# Patient Record
Sex: Male | Born: 1935
Health system: Southern US, Community
[De-identification: ages and names within clinical notes are randomized; demographics above are authoritative.]

## PROBLEM LIST (undated history)

## (undated) DIAGNOSIS — K5790 Diverticulosis of intestine, part unspecified, without perforation or abscess without bleeding: Secondary | ICD-10-CM

## (undated) DIAGNOSIS — N419 Inflammatory disease of prostate, unspecified: Secondary | ICD-10-CM

## (undated) DIAGNOSIS — K219 Gastro-esophageal reflux disease without esophagitis: Secondary | ICD-10-CM

## (undated) DIAGNOSIS — J8410 Pulmonary fibrosis, unspecified: Secondary | ICD-10-CM

## (undated) DIAGNOSIS — K649 Unspecified hemorrhoids: Secondary | ICD-10-CM

## (undated) DIAGNOSIS — I7090 Unspecified atherosclerosis: Secondary | ICD-10-CM

## (undated) DIAGNOSIS — J301 Allergic rhinitis due to pollen: Secondary | ICD-10-CM

## (undated) DIAGNOSIS — I10 Essential (primary) hypertension: Secondary | ICD-10-CM

## (undated) DIAGNOSIS — F1910 Other psychoactive substance abuse, uncomplicated: Secondary | ICD-10-CM

## (undated) DIAGNOSIS — Z7289 Other problems related to lifestyle: Secondary | ICD-10-CM

## (undated) DIAGNOSIS — K922 Gastrointestinal hemorrhage, unspecified: Secondary | ICD-10-CM

## (undated) DIAGNOSIS — M129 Arthropathy, unspecified: Secondary | ICD-10-CM

## (undated) DIAGNOSIS — G4733 Obstructive sleep apnea (adult) (pediatric): Secondary | ICD-10-CM

## (undated) DIAGNOSIS — Z950 Presence of cardiac pacemaker: Secondary | ICD-10-CM

## (undated) DIAGNOSIS — M1990 Unspecified osteoarthritis, unspecified site: Secondary | ICD-10-CM

## (undated) DIAGNOSIS — K639 Disease of intestine, unspecified: Secondary | ICD-10-CM

## (undated) DIAGNOSIS — G4730 Sleep apnea, unspecified: Secondary | ICD-10-CM

## (undated) DIAGNOSIS — Z5189 Encounter for other specified aftercare: Secondary | ICD-10-CM

## (undated) DIAGNOSIS — R933 Abnormal findings on diagnostic imaging of other parts of digestive tract: Secondary | ICD-10-CM

## (undated) DIAGNOSIS — N529 Male erectile dysfunction, unspecified: Secondary | ICD-10-CM

## (undated) DIAGNOSIS — I480 Paroxysmal atrial fibrillation: Secondary | ICD-10-CM

## (undated) DIAGNOSIS — F1090 Alcohol use, unspecified, uncomplicated: Secondary | ICD-10-CM

## (undated) DIAGNOSIS — E669 Obesity, unspecified: Secondary | ICD-10-CM

## (undated) DIAGNOSIS — H269 Unspecified cataract: Secondary | ICD-10-CM

## (undated) DIAGNOSIS — C4490 Unspecified malignant neoplasm of skin, unspecified: Secondary | ICD-10-CM

## (undated) DIAGNOSIS — K31819 Angiodysplasia of stomach and duodenum without bleeding: Secondary | ICD-10-CM

## (undated) DIAGNOSIS — K449 Diaphragmatic hernia without obstruction or gangrene: Secondary | ICD-10-CM

## (undated) DIAGNOSIS — I429 Cardiomyopathy, unspecified: Secondary | ICD-10-CM

## (undated) DIAGNOSIS — R001 Bradycardia, unspecified: Secondary | ICD-10-CM

## (undated) DIAGNOSIS — E785 Hyperlipidemia, unspecified: Secondary | ICD-10-CM

## (undated) DIAGNOSIS — I4892 Unspecified atrial flutter: Secondary | ICD-10-CM

## (undated) DIAGNOSIS — J439 Emphysema, unspecified: Secondary | ICD-10-CM

## (undated) DIAGNOSIS — K222 Esophageal obstruction: Secondary | ICD-10-CM

## (undated) DIAGNOSIS — Z9989 Dependence on other enabling machines and devices: Secondary | ICD-10-CM

## (undated) HISTORY — DX: Pulmonary fibrosis, unspecified: J84.10

## (undated) HISTORY — PX: INGUINAL HERNIA REPAIR: SHX194

## (undated) HISTORY — DX: Unspecified atherosclerosis: I70.90

## (undated) HISTORY — DX: Allergic rhinitis due to pollen: J30.1

## (undated) HISTORY — DX: Encounter for other specified aftercare: Z51.89

## (undated) HISTORY — DX: Gastrointestinal hemorrhage, unspecified: K92.2

## (undated) HISTORY — DX: Diverticulosis of intestine, part unspecified, without perforation or abscess without bleeding: K57.90

## (undated) HISTORY — DX: Alcohol use, unspecified, uncomplicated: F10.90

## (undated) HISTORY — DX: Other psychoactive substance abuse, uncomplicated: F19.10

## (undated) HISTORY — DX: Emphysema, unspecified: J43.9

## (undated) HISTORY — DX: Essential (primary) hypertension: I10

## (undated) HISTORY — DX: Unspecified malignant neoplasm of skin, unspecified: C44.90

## (undated) HISTORY — DX: Unspecified osteoarthritis, unspecified site: M19.90

## (undated) HISTORY — PX: ELBOW SURGERY: SHX618

## (undated) HISTORY — DX: Unspecified hemorrhoids: K64.9

## (undated) HISTORY — DX: Arthropathy, unspecified: M12.9

## (undated) HISTORY — DX: Unspecified cataract: H26.9

## (undated) HISTORY — PX: TONSILLECTOMY: SHX5217

## (undated) HISTORY — DX: Sleep apnea, unspecified: G47.30

## (undated) HISTORY — DX: Disease of intestine, unspecified: K63.9

## (undated) HISTORY — DX: Diaphragmatic hernia without obstruction or gangrene: K44.9

## (undated) HISTORY — DX: Inflammatory disease of prostate, unspecified: N41.9

## (undated) HISTORY — DX: Hyperlipidemia, unspecified: E78.5

## (undated) HISTORY — PX: UPPER GASTROINTESTINAL ENDOSCOPY: SHX188

## (undated) HISTORY — DX: Abnormal findings on diagnostic imaging of other parts of digestive tract: R93.3

## (undated) HISTORY — DX: Angiodysplasia of stomach and duodenum without bleeding: K31.819

## (undated) HISTORY — DX: Male erectile dysfunction, unspecified: N52.9

## (undated) HISTORY — DX: Obesity, unspecified: E66.9

## (undated) HISTORY — DX: Esophageal obstruction: K22.2

## (undated) HISTORY — PX: COLONOSCOPY: SHX174

## (undated) HISTORY — DX: Other problems related to lifestyle: Z72.89

---

## 1990-05-16 HISTORY — PX: OTHER SURGICAL HISTORY: SHX169

## 2004-04-29 ENCOUNTER — Ambulatory Visit: Payer: Self-pay | Admitting: Internal Medicine

## 2005-01-27 ENCOUNTER — Ambulatory Visit: Payer: Self-pay | Admitting: Internal Medicine

## 2005-02-01 ENCOUNTER — Ambulatory Visit: Payer: Self-pay | Admitting: Internal Medicine

## 2005-04-29 ENCOUNTER — Ambulatory Visit: Payer: Self-pay | Admitting: Pulmonary Disease

## 2005-06-15 ENCOUNTER — Ambulatory Visit: Payer: Self-pay | Admitting: Internal Medicine

## 2006-01-05 ENCOUNTER — Ambulatory Visit: Payer: Self-pay | Admitting: Internal Medicine

## 2006-03-17 ENCOUNTER — Ambulatory Visit: Payer: Self-pay | Admitting: Internal Medicine

## 2006-04-03 ENCOUNTER — Ambulatory Visit (HOSPITAL_COMMUNITY): Admission: RE | Admit: 2006-04-03 | Discharge: 2006-04-03 | Payer: Self-pay | Admitting: General Surgery

## 2006-04-07 ENCOUNTER — Ambulatory Visit: Payer: Self-pay | Admitting: Internal Medicine

## 2006-09-15 ENCOUNTER — Ambulatory Visit: Payer: Self-pay | Admitting: Internal Medicine

## 2006-09-15 LAB — CONVERTED CEMR LAB: PSA: 3.71 ng/mL (ref 0.10–4.00)

## 2007-01-24 ENCOUNTER — Encounter: Payer: Self-pay | Admitting: Internal Medicine

## 2007-01-24 DIAGNOSIS — I700 Atherosclerosis of aorta: Secondary | ICD-10-CM | POA: Insufficient documentation

## 2007-01-24 DIAGNOSIS — R972 Elevated prostate specific antigen [PSA]: Secondary | ICD-10-CM

## 2007-01-24 DIAGNOSIS — Z9089 Acquired absence of other organs: Secondary | ICD-10-CM | POA: Insufficient documentation

## 2007-01-24 DIAGNOSIS — Z8719 Personal history of other diseases of the digestive system: Secondary | ICD-10-CM

## 2007-01-24 DIAGNOSIS — D126 Benign neoplasm of colon, unspecified: Secondary | ICD-10-CM

## 2007-01-24 DIAGNOSIS — Z9889 Other specified postprocedural states: Secondary | ICD-10-CM | POA: Insufficient documentation

## 2007-01-24 DIAGNOSIS — Z85828 Personal history of other malignant neoplasm of skin: Secondary | ICD-10-CM

## 2007-01-24 DIAGNOSIS — J301 Allergic rhinitis due to pollen: Secondary | ICD-10-CM | POA: Insufficient documentation

## 2007-02-22 ENCOUNTER — Ambulatory Visit: Payer: Self-pay | Admitting: Internal Medicine

## 2007-03-20 ENCOUNTER — Ambulatory Visit: Payer: Self-pay | Admitting: Internal Medicine

## 2007-03-20 DIAGNOSIS — F528 Other sexual dysfunction not due to a substance or known physiological condition: Secondary | ICD-10-CM

## 2007-03-20 DIAGNOSIS — N529 Male erectile dysfunction, unspecified: Secondary | ICD-10-CM | POA: Insufficient documentation

## 2007-03-20 DIAGNOSIS — R351 Nocturia: Secondary | ICD-10-CM

## 2007-03-20 LAB — CONVERTED CEMR LAB: Testosterone: 276.55 ng/dL — ABNORMAL LOW (ref 350.00–890)

## 2007-03-30 ENCOUNTER — Telehealth: Payer: Self-pay | Admitting: Internal Medicine

## 2007-04-30 ENCOUNTER — Ambulatory Visit: Payer: Self-pay | Admitting: Internal Medicine

## 2007-04-30 DIAGNOSIS — J069 Acute upper respiratory infection, unspecified: Secondary | ICD-10-CM | POA: Insufficient documentation

## 2007-06-04 ENCOUNTER — Ambulatory Visit: Payer: Self-pay | Admitting: Internal Medicine

## 2007-06-20 ENCOUNTER — Telehealth (INDEPENDENT_AMBULATORY_CARE_PROVIDER_SITE_OTHER): Payer: Self-pay | Admitting: *Deleted

## 2007-07-02 ENCOUNTER — Ambulatory Visit: Payer: Self-pay | Admitting: Internal Medicine

## 2007-07-30 ENCOUNTER — Ambulatory Visit: Payer: Self-pay | Admitting: Internal Medicine

## 2007-08-29 ENCOUNTER — Ambulatory Visit: Payer: Self-pay | Admitting: Internal Medicine

## 2007-09-11 ENCOUNTER — Ambulatory Visit: Payer: Self-pay | Admitting: Internal Medicine

## 2007-09-11 DIAGNOSIS — M549 Dorsalgia, unspecified: Secondary | ICD-10-CM | POA: Insufficient documentation

## 2007-10-04 ENCOUNTER — Ambulatory Visit: Payer: Self-pay | Admitting: Internal Medicine

## 2007-10-12 ENCOUNTER — Ambulatory Visit: Payer: Self-pay | Admitting: Internal Medicine

## 2007-11-05 ENCOUNTER — Ambulatory Visit: Payer: Self-pay | Admitting: Internal Medicine

## 2007-11-15 ENCOUNTER — Ambulatory Visit: Payer: Self-pay | Admitting: Internal Medicine

## 2007-12-03 ENCOUNTER — Ambulatory Visit: Payer: Self-pay | Admitting: Internal Medicine

## 2008-01-07 ENCOUNTER — Telehealth: Payer: Self-pay | Admitting: Internal Medicine

## 2008-01-07 ENCOUNTER — Ambulatory Visit: Payer: Self-pay | Admitting: Internal Medicine

## 2008-02-04 ENCOUNTER — Ambulatory Visit: Payer: Self-pay | Admitting: Internal Medicine

## 2008-02-07 ENCOUNTER — Ambulatory Visit: Payer: Self-pay | Admitting: Internal Medicine

## 2008-03-10 ENCOUNTER — Encounter: Payer: Self-pay | Admitting: Internal Medicine

## 2008-03-10 ENCOUNTER — Ambulatory Visit: Payer: Self-pay | Admitting: Internal Medicine

## 2008-03-10 DIAGNOSIS — I10 Essential (primary) hypertension: Secondary | ICD-10-CM | POA: Insufficient documentation

## 2008-04-11 ENCOUNTER — Ambulatory Visit: Payer: Self-pay | Admitting: Internal Medicine

## 2008-04-11 LAB — CONVERTED CEMR LAB
Basophils Relative: 0.8 % (ref 0.0–3.0)
Calcium: 8.6 mg/dL (ref 8.4–10.5)
Cholesterol: 159 mg/dL (ref 0–200)
Creatinine, Ser: 0.9 mg/dL (ref 0.4–1.5)
GFR calc Af Amer: 106 mL/min
GFR calc non Af Amer: 88 mL/min
HCT: 47.3 % (ref 39.0–52.0)
Lymphocytes Relative: 33.6 % (ref 12.0–46.0)
MCHC: 35.1 g/dL (ref 30.0–36.0)
MCV: 99.2 fL (ref 78.0–100.0)
Monocytes Relative: 14.4 % — ABNORMAL HIGH (ref 3.0–12.0)
Neutro Abs: 3.1 10*3/uL (ref 1.4–7.7)
Neutrophils Relative %: 49.3 % (ref 43.0–77.0)
PSA: 5.23 ng/mL — ABNORMAL HIGH (ref 0.10–4.00)
Platelets: 152 10*3/uL (ref 150–400)
RBC: 4.77 M/uL (ref 4.22–5.81)
RDW: 12.5 % (ref 11.5–14.6)
Sodium: 140 meq/L (ref 135–145)

## 2008-04-13 ENCOUNTER — Telehealth: Payer: Self-pay | Admitting: Internal Medicine

## 2008-04-17 ENCOUNTER — Ambulatory Visit: Payer: Self-pay

## 2008-04-26 ENCOUNTER — Encounter: Payer: Self-pay | Admitting: Internal Medicine

## 2008-04-28 ENCOUNTER — Ambulatory Visit: Payer: Self-pay | Admitting: Internal Medicine

## 2008-07-14 ENCOUNTER — Encounter: Payer: Self-pay | Admitting: Internal Medicine

## 2009-01-08 ENCOUNTER — Encounter: Payer: Self-pay | Admitting: Internal Medicine

## 2009-01-13 ENCOUNTER — Ambulatory Visit: Payer: Self-pay | Admitting: Internal Medicine

## 2009-01-26 ENCOUNTER — Telehealth: Payer: Self-pay | Admitting: Internal Medicine

## 2009-01-27 ENCOUNTER — Ambulatory Visit: Payer: Self-pay | Admitting: Internal Medicine

## 2009-02-02 ENCOUNTER — Telehealth: Payer: Self-pay | Admitting: Internal Medicine

## 2009-02-25 ENCOUNTER — Telehealth (INDEPENDENT_AMBULATORY_CARE_PROVIDER_SITE_OTHER): Payer: Self-pay | Admitting: *Deleted

## 2009-03-12 ENCOUNTER — Encounter: Payer: Self-pay | Admitting: Internal Medicine

## 2009-04-13 ENCOUNTER — Ambulatory Visit: Payer: Self-pay | Admitting: Internal Medicine

## 2009-05-01 ENCOUNTER — Encounter (INDEPENDENT_AMBULATORY_CARE_PROVIDER_SITE_OTHER): Payer: Self-pay | Admitting: *Deleted

## 2009-05-06 ENCOUNTER — Ambulatory Visit: Payer: Self-pay | Admitting: Gastroenterology

## 2009-05-20 ENCOUNTER — Ambulatory Visit: Payer: Self-pay | Admitting: Gastroenterology

## 2009-05-21 ENCOUNTER — Ambulatory Visit: Payer: Self-pay | Admitting: Internal Medicine

## 2009-05-21 ENCOUNTER — Telehealth (INDEPENDENT_AMBULATORY_CARE_PROVIDER_SITE_OTHER): Payer: Self-pay | Admitting: *Deleted

## 2009-05-21 DIAGNOSIS — R001 Bradycardia, unspecified: Secondary | ICD-10-CM

## 2009-06-04 ENCOUNTER — Encounter: Payer: Self-pay | Admitting: Internal Medicine

## 2009-06-10 ENCOUNTER — Telehealth: Payer: Self-pay | Admitting: Internal Medicine

## 2009-06-10 ENCOUNTER — Encounter: Payer: Self-pay | Admitting: Internal Medicine

## 2009-06-11 ENCOUNTER — Ambulatory Visit: Payer: Self-pay

## 2010-03-16 ENCOUNTER — Encounter: Payer: Self-pay | Admitting: Internal Medicine

## 2010-04-14 ENCOUNTER — Encounter: Payer: Self-pay | Admitting: Internal Medicine

## 2010-04-14 ENCOUNTER — Ambulatory Visit: Payer: Self-pay | Admitting: Internal Medicine

## 2010-04-14 LAB — CONVERTED CEMR LAB
BUN: 18 mg/dL (ref 6–23)
Calcium: 9.8 mg/dL (ref 8.4–10.5)
Cholesterol: 221 mg/dL — ABNORMAL HIGH (ref 0–200)
Creatinine, Ser: 1.1 mg/dL (ref 0.4–1.5)
Direct LDL: 139.1 mg/dL
GFR calc non Af Amer: 73.13 mL/min (ref >60)
Glucose, Bld: 100 mg/dL — ABNORMAL HIGH (ref 70–99)
Total CHOL/HDL Ratio: 4
Triglycerides: 84 mg/dL (ref 0.0–149.0)
VLDL: 16.8 mg/dL (ref 0.0–40.0)

## 2010-04-21 ENCOUNTER — Telehealth: Payer: Self-pay | Admitting: Internal Medicine

## 2010-04-27 ENCOUNTER — Telehealth (INDEPENDENT_AMBULATORY_CARE_PROVIDER_SITE_OTHER): Payer: Self-pay | Admitting: *Deleted

## 2010-04-28 ENCOUNTER — Encounter: Payer: Self-pay | Admitting: Cardiology

## 2010-04-28 ENCOUNTER — Ambulatory Visit: Payer: Self-pay

## 2010-04-28 ENCOUNTER — Encounter: Payer: Self-pay | Admitting: *Deleted

## 2010-04-28 ENCOUNTER — Encounter (HOSPITAL_COMMUNITY)
Admission: RE | Admit: 2010-04-28 | Discharge: 2010-06-15 | Payer: Self-pay | Source: Home / Self Care | Attending: Internal Medicine | Admitting: Internal Medicine

## 2010-05-02 ENCOUNTER — Telehealth: Payer: Self-pay | Admitting: Internal Medicine

## 2010-05-11 ENCOUNTER — Ambulatory Visit
Admission: RE | Admit: 2010-05-11 | Discharge: 2010-05-11 | Payer: Self-pay | Source: Home / Self Care | Attending: Internal Medicine | Admitting: Internal Medicine

## 2010-06-13 LAB — CONVERTED CEMR LAB
BUN: 13 mg/dL (ref 6–23)
Basophils Relative: 0.5 % (ref 0.0–3.0)
Chloride: 102 meq/L (ref 96–112)
Direct LDL: 146.4 mg/dL
Glucose, Bld: 102 mg/dL — ABNORMAL HIGH (ref 70–99)
HCT: 44.6 % (ref 39.0–52.0)
HDL: 53.1 mg/dL (ref >39.00)
Lymphocytes Relative: 31.8 % (ref 12.0–46.0)
MCV: 101.4 fL — ABNORMAL HIGH (ref 78.0–100.0)
Monocytes Relative: 5.9 % (ref 3.0–12.0)
Neutro Abs: 4.9 10*3/uL (ref 1.4–7.7)
Neutrophils Relative %: 60.8 % (ref 43.0–77.0)
Platelets: 166 10*3/uL (ref 150.0–400.0)
RBC: 4.4 M/uL (ref 4.22–5.81)
RDW: 12.5 % (ref 11.5–14.6)
Sodium: 139 meq/L (ref 135–145)

## 2010-06-15 NOTE — Progress Notes (Signed)
Summary: APPT  ---- Converted from flag ---- ---- 05/21/2009 9:11 AM, Jacques Navy MD wrote: patinet needs appt this pm or tomorrow for evaluation of bradycardia per Dr. Jarold Motto ------------------------------  Gave pt appt:  05/21/09 @ 350p w/Dr Ala Bent

## 2010-06-15 NOTE — Progress Notes (Signed)
  Phone Note Call from Patient   Summary of Call: several e-mails (to be scanned) about his heart rate - bradycardic and he also is concerned about his rate being irregular. He doesn't feel bad. He is able to go to the gym and do his regular activities. Concern is for an irregular rhythm, i.e. a. fib/flutter.  Plan - for outpatient event recorder.  Initial call taken by: Jacques Navy MD,  June 10, 2009 9:21 AM

## 2010-06-15 NOTE — Letter (Signed)
Summary: Email from pt/[facility]  Email from pt/[facility]   Imported By: Lester Leota 06/12/2009 15:29:32  _____________________________________________________________________  External Attachment:    Type:   Image     Comment:   External Document

## 2010-06-15 NOTE — Procedures (Signed)
Summary: Colonoscopy  Patient: [name] Note: All result statuses are Final unless otherwise noted.  Tests: (1) Colonoscopy (COL)   COL Colonoscopy           DONE     [facility]     [address]     [place], [place]  [address]           COLONOSCOPY PROCEDURE REPORT           PATIENT:  Donald [name], Donald [name]  MR#:  [id number]     BIRTHDATE:  [birth year], 76 yrs. old  GENDER:  male           ENDOSCOPIST:  Vania Rea. Jarold Motto, MD, [facility]     Referred by:  Rosalyn Gess. Norins, M.D.           PROCEDURE DATE:  05/20/2009     PROCEDURE:  Colonoscopy, Diagnostic     ASA CLASS:  Class II     INDICATIONS:  Routine Risk Screening           MEDICATIONS:   Fentanyl 75 mcg IV, Versed 7 mg IV           DESCRIPTION OF PROCEDURE:   After the risks benefits and     alternatives of the procedure were thoroughly explained, informed     consent was obtained.  Digital rectal exam was performed and     revealed no abnormalities.   The LB [id number] endoscope     was introduced through the anus and advanced to the cecum, which     was identified by both the appendix and ileocecal valve, limited     by a redundant colon, extreme patient discomfort.    The quality     of the prep was adequate, using MoviPrep.  The instrument was then     slowly withdrawn as the colon was fully examined.     <<PROCEDUREIMAGES>>           FINDINGS:  Severe diverticulosis was found sigmoid to descending     BRADYCARDIA BEFORE AND DURING THE PROCEDURE.  No polyps or cancers     were seen. ADHESIONS IN RIGHT MID ABDOMEN.HARD, DIFFICULT EXAM.     Retroflexed views in the rectum revealed no abnormalities.    The     scope was then withdrawn from the patient and the procedure     completed.           COMPLICATIONS:  None           ENDOSCOPIC IMPRESSION:     1) Severe diverticulosis in the sigmoid to descending     2) No polyps or cancers     RECOMMENDATIONS:     1) high fiber diet           REPEAT EXAM:   No           ______________________________     Vania Rea. Jarold Motto, MD, [facility]           CC:           n.     eSIGNED:   Vania Rea. Patterson at 05/20/2009 12:23 PM           Noah Delaine, [id number]  Note: An exclamation mark (!) indicates a result that was not dispersed into the flowsheet. Document Creation Date: 05/20/2009 12:21 PM _______________________________________________________________________  (1) Order result status: Final Collection or observation date-time: 05/20/2009 12:13 Requested date-time:  Receipt date-time:  Reported date-time:  Referring  Physician:   Ordering Physician: Sheryn Bison [phone]) Specimen Source:  Source: Launa Grill Order Number: [id number] Lab site:

## 2010-06-15 NOTE — Letter (Signed)
Summary: [facility]   Imported By: Sherian Rein 03/23/2010 10:35:07  _____________________________________________________________________  External Attachment:    Type:   Image     Comment:   External Document

## 2010-06-15 NOTE — Assessment & Plan Note (Signed)
Summary: YEARLY FU/ LABS AFTER/# /NWS   Vital Signs:  Patient profile:   77 year old male Height:      69 inches Weight:      211 pounds BMI:     31.27 O2 Sat:      96 % on Room air Temp:     97.0 degrees F oral Pulse rate:   56 / minute BP sitting:   110 / 62  (left arm) Cuff size:   large  Vitals Entered By: Ami Bullins CMA (April 14, 2010 10:12 AM)  O2 Flow:  Room air  Primary Care Provider:  Norins   History of Present Illness: Interval history chronic hip and knee pain exacerbated by exercise. He does feel an occasional palpitation still, a little acid reflux treated with OTC meds H2 blocker/calcium carbonate. He occasionally will have a problem reflux, also has to be careful about dysphagia.   He is fully independent in all ADLs. No depression-by symptoms or signs. He is managing all his own affiars and finances. He has not had any falls and is very conscious about rall risk.   Preventive Screening-Counseling & Management  Alcohol-Tobacco     Alcohol drinks/day: 3     Alcohol type: wine, whiskey     Smoking Status: current     Smoking Cessation Counseling: occasional cigar  Caffeine-Diet-Exercise     Caffeine use/day: 2 cup daily     Does Patient Exercise: yes     Type of exercise: walking, light weight lifting     Times/week: 7  Hep-HIV-STD-Contraception     Hepatitis Risk: no risk noted     HIV Risk: no risk noted     STD Risk: no risk noted     Dental Visit-last 6 months yes     Sun Exposure-Excessive: no  Safety-Violence-Falls     Seat Belt Use: yes     Helmet Use: n/a     Firearms in the Home: firearms in the home     Smoke Detectors: yes     Violence in the Home: no risk noted     Sexual Abuse: no     Fall Risk: low fall risk      Sexual History:  currently monogamous.        Drug Use:  never.        Blood Transfusions:  no.    Current Medications (verified): 1)  Multivitamins   Tabs (Multiple Vitamin) .... Take One Tablet Daily 2)  Fish  Oil 1000 Mg  Caps (Omega-3 Fatty Acids) .... Take 1  Tablets Daily 3)  Calcium 500 Mg  Tabs (Calcium) .... Take 1 Tablets Daily 4)  Vitamin C 500 Mg  Tabs (Ascorbic Acid) .... Take One Tablet Daily 5)  Fluticasone Propionate 50 Mcg/act  Susp (Fluticasone Propionate) .Marland Kitchen.. 1 Spray Per Nostril Once Daily 6)  Tylenol Extra Strength 500 Mg  Tabs (Acetaminophen) .... As Needed 7)  Furosemide 20 Mg Tabs (Furosemide) .Marland Kitchen.. 1 By Mouth Once Daily For Hypertension 8)  Toprol Xl 25 Mg Xr24h-Tab (Metoprolol Succinate) .Marland Kitchen.. 1 By Mouth Once Daily For Bp 9)  Finasteride 5 Mg Tabs (Finasteride) .Marland Kitchen.. 1 Tab Daily 10)  Claritin 10 Mg Tabs (Loratadine) .Marland Kitchen.. 1 Tab Daily  Allergies (verified): No Known Drug Allergies  Past History:  Past Medical History: Last updated: 01/24/2007 SKIN CANCER, HX OF (ICD-V10.83) ATHEROSCLEROSIS, AORTIC (ICD-440.0) Hx of ARTHRITIS,MILD (ICD-716.90) PROSTATITIS, RECURRENT (ICD-601.9) HIATAL HERNIA, HX OF (ICD-V12.79) HAY FEVER (ICD-477.0) POLYP, COLON (ICD-211.3)  Past Surgical History: Last updated: 01/24/2007 INGUINAL HERNIORRHAPHY, RIGHT, HX OF (ICD-V45.89) INGUINAL HERNIORRHAPHY, LEFT, HX OF (ICD-V45.89) TONSILLECTOMY, HX OF (ICD-V45.79) ORIF FRACTURE OF THE ELBOW  Family History: Last updated: 05-19-07 father- died 10; renal disease, grief over loss of spouse mother- died ovarian cancer Brother - prostate cancer, died of hematologic disorder 2nd to chemo Neg - colon cancer, CAD, DM  Social History: Last updated: 2007-05-19 [place] - [employer] attorney married 1961 2 sons, I daughter, 4 grandchildren work: Psychologist, forensic, retired but still consults.  Golfer, gardner, volunteer.  Social History: Smoking Status:  current Caffeine use/day:  2 cup daily Dental Care w/in 6 mos.:  yes Sun Exposure-Excessive:  no Seat Belt  Use:  yes Fall Risk:  low fall risk Hepatitis Risk:  no risk noted HIV Risk:  no risk noted STD Risk:  no risk noted Sexual History:  currently monogamous Drug Use:  never Blood Transfusions:  no  Review of Systems  The patient denies anorexia, fever, weight loss, weight gain, vision loss, hoarseness, chest pain, syncope, peripheral edema, headaches, abdominal pain, hematochezia, severe indigestion/heartburn, genital sores, suspicious skin lesions, difficulty walking, unusual weight change, enlarged lymph nodes, and angioedema.    Physical Exam  General:  Well-developed,well-nourished,in no acute distress; alert,appropriate and cooperative throughout examination Head:  normocephalic and atraumatic.   Eyes:  vision grossly intact, pupils equal, pupils round, and corneas and lenses clear.   Ears:  External ear exam shows no significant lesions or deformities.  Otoscopic examination reveals clear canals, tympanic membranes are intact bilaterally without bulging, retraction, inflammation or discharge. Hearing is grossly normal bilaterally. Nose:  no external deformity and no external erythema.   Mouth:  complex lower partial denture, no dentition otherwise, no buccal or palatal lesions Neck:  supple, full ROM, no masses, no thyromegaly, and no carotid bruits.   Chest Wall:  No deformities, masses, tenderness or gynecomastia noted. Lungs:  Normal respiratory effort, chest expands symmetrically. Lungs are clear to auscultation, no crackles or wheezes. Heart:  Normal rate and regular rhythm. S1 and S2 normal without gallop, murmur, click, rub or other extra sounds. Abdomen:  soft, non-tender, normal bowel sounds, no masses, no guarding, and no hepatomegaly.   Prostate:  deferred to Dr. Retta Diones Msk:  normal ROM, no joint tenderness, no joint swelling, and no joint instability.   Pulses:  2+ radial and DP pulses Extremities:  No clubbing, cyanosis, edema, or deformity noted with normal full  range of motion of all joints.   Neurologic:  alert & oriented X3, cranial nerves II-XII intact, strength normal in all extremities, sensation intact to light touch, gait normal, and DTRs symmetrical and normal.   Skin:  turgor normal, color normal, and no suspicious lesions.  Multiple moles none that look suspicious Cervical Nodes:  no anterior cervical adenopathy and no posterior cervical adenopathy.   Psych:  Oriented X3, memory intact for recent and remote, normally interactive, good eye contact, and not anxious appearing.     Impression & Recommendations:  Problem # 1:  HYPERTENSION (ICD-401.9)  His updated medication list for this problem includes:    Furosemide 20 Mg Tabs (Furosemide) .Marland Kitchen... 1 by mouth once daily for hypertension    Toprol Xl 25 Mg Xr24h-tab (Metoprolol succinate) .Marland Kitchen... 1 by mouth once daily for bp  Orders: TLB-BMP (Basic Metabolic Panel-BMET) (80048-METABOL)  BP today: 110/62 Prior BP: 120/80 (05/21/2009)  Very good contorl on present medications.  Plan - continue present regimen  Problem # 2:  BACK  PAIN (ICD-724.5) Chronic mild back pain with no real limitations in activities.  His updated medication list for this problem includes:    Tylenol Extra Strength 500 Mg Tabs (Acetaminophen) .Marland Kitchen... As needed  Problem # 3:  PROSTATITIS, RECURRENT (ICD-601.9) Patient is stable. He follows routinely with Dr. Retta Diones and was seen Mar 16, 2010 with normal prostate exam and PSA  Problem # 4:  cardiac risk stratification In talking with the patient he does describe a substernal discomfort that is infrequent, intermittent and of short duration that does seems related to meals or reflux. He does admit to decreased exercise tolerance (i.e. ability to do yardwork) over the past several months. Risk factors for cardiac disease: male gender and age, hypertension, aortic calcification-known, overweight, former smoker.  Plan - for stress cardiolite to r/o ischemic heart  disease.   Problem # 5:  Preventive Health Care (ICD-V70.0)  interval history without major medical problems, injury or surgery. Physical exam is normal. Lab- borderline elevated LDL cholesterol at 136, otherwise normal lab. Current for prostate cancer screening. Current for colorectal cancer screening with last colonoscopy Jan '11. Immunizations: tetnus Nov '09, pneumovax Nov '08. He is a candidate for shingles vaccine. 12 Lead EKG with sinus bradycardia and no ischemic changes.   In summary - a very nice man who appears medically stable except for concern about CAD risk. He will be notified as to NST schedule. He will otherwise return 1 year or as needed.   Orders: Medicare -1st Annual Wellness Visit [phone])  Complete Medication List: 1)  Multivitamins Tabs (Multiple vitamin) .... Take one tablet daily 2)  Fish Oil 1000 Mg Caps (Omega-3 fatty acids) .... Take 1  tablets daily 3)  Calcium 500 Mg Tabs (Calcium) .... Take 1 tablets daily 4)  Vitamin C 500 Mg Tabs (Ascorbic acid) .... Take one tablet daily 5)  Fluticasone Propionate 50 Mcg/act Susp (Fluticasone propionate) .Marland Kitchen.. 1 spray per nostril once daily 6)  Tylenol Extra Strength 500 Mg Tabs (Acetaminophen) .... As needed 7)  Furosemide 20 Mg Tabs (Furosemide) .Marland Kitchen.. 1 by mouth once daily for hypertension 8)  Toprol Xl 25 Mg Xr24h-tab (Metoprolol succinate) .Marland Kitchen.. 1 by mouth once daily for bp 9)  Finasteride 5 Mg Tabs (Finasteride) .Marland Kitchen.. 1 tab daily 10)  Claritin 10 Mg Tabs (Loratadine) .Marland Kitchen.. 1 tab daily  Other Orders: TLB-Lipid Panel (80061-LIPID) Cardiolite (Cardiolite)  Patient: Donald [name] Note: All result statuses are Final unless otherwise noted.  Tests: (1) BMP (METABOL)   Sodium                    140 mEq/L                   135-145   Potassium                 4.5 mEq/L                   3.5-5.1   Chloride                  103 mEq/L                   96-112   Carbon Dioxide            29 mEq/L                    19-32    Glucose              [  H]  100 mg/dL                   16-10   BUN                       18 mg/dL                    9-60   Creatinine                1.1 mg/dL                   4.5-4.0   Calcium                   9.8 mg/dL                   9.8-11.9   GFR                       73.13 mL/min                >60  Tests: (2) Lipid Panel (LIPID)   Cholesterol          [H]  221 mg/dL                   1-478     ATP III Classification            Desirable:  < 200 mg/dL                    Borderline High:  200 - 239 mg/dL               High:  > = 240 mg/dL   Triglycerides             84.0 mg/dL                  2.9-562.1     Normal:  <150 mg/dL     Borderline High:  308 - 199 mg/dL   HDL                       65.78 mg/dL                 >46.96   VLDL Cholesterol          16.8 mg/dL                  2.9-52.8  CHO/HDL Ratio:  CHD Risk                             4                    Men          Women     1/2 Average Risk     3.4          3.3     Average Risk          5.0          4.4     2X Average Risk          9.6          7.1     3X Average Risk          15.0  11.0                           Tests: (3) Cholesterol LDL - Direct (DIRLDL)  Cholesterol LDL - Direct                             139.1 mg/dL     Optimal:  <981 mg/dL     Near or Above Optimal:  100-129 mg/dL     Borderline High:  191-478 mg/dL     High:  295-621 mg/dL     Very High:  >308 mg/dLPrescriptions: TOPROL XL 25 MG XR24H-TAB (METOPROLOL SUCCINATE) 1 by mouth once daily for BP  #90 x 3   Entered and Authorized by:   Jacques Navy MD   Signed by:   Jacques Navy MD on 04/14/2010   Method used:   Electronically to        [facility]* (retail)       [address]       [place], [place]  [address]       Ph: [phone]       Fax: [phone]   RxID:   [id number] FUROSEMIDE 20 MG TABS (FUROSEMIDE) 1 by mouth once daily for hypertension  #90 x 3   Entered and Authorized by:   Jacques Navy MD   Signed by:   Jacques Navy MD on 04/14/2010   Method used:   Electronically to        [facility]* (retail)       [address]       [place], [place]  [id number]       Ph: [phone]       Fax: [phone]   RxID:   [id number]    Orders Added: 1)  TLB-BMP (Basic Metabolic Panel-BMET) [80048-METABOL] 2)  TLB-Lipid Panel [80061-LIPID] 3)  Cardiolite [Cardiolite] 4)  Medicare -1st Annual Wellness Visit [G0438] 5)  Est. Patient Level III [[address]]

## 2010-06-15 NOTE — Letter (Signed)
Summary: [facility]  [facility]   Imported By: Lester  06/15/2009 10:37:01  _____________________________________________________________________  External Attachment:    Type:   Image     Comment:   External Document

## 2010-06-15 NOTE — Progress Notes (Signed)
  Phone Note Other Incoming   Summary of Call: Pts insurance [facility]) is changing their drug plan. Pts copay is going up on his metroprolol 25mg  ER. They are suggesting alternatives such as metroprolol tartrate, carvedilol  and atenolol. Which ever will be the best subsitute pt wants 90 day supply sent to [facility]. Please Advise Initial call taken by: Ami Bullins CMA,  April 21, 2010 9:27 AM  Follow-up for Phone Call        atenolol 25mg  1 by mouth once daily , #90, 3 refills Follow-up by: Jacques Navy MD,  April 21, 2010 1:46 PM    New/Updated Medications: ATENOLOL 25 MG TABS (ATENOLOL) 1 tab once daily Prescriptions: ATENOLOL 25 MG TABS (ATENOLOL) 1 tab once daily  #90 x 3   Entered by:   Ami Bullins CMA   Authorized by:   Jacques Navy MD   Signed by:   Bill Salinas CMA on 04/21/2010   Method used:   Electronically to        [facility]* (retail)       [address]       [place], [place]  [id number]       Ph: [phone]       Fax: [phone]   RxID:   [id number]

## 2010-06-15 NOTE — Assessment & Plan Note (Signed)
Summary: PER PH NOTE/MEN--BRADYCARDIA--STC   Vital Signs:  Patient profile:   77 year old male Height:      69 inches Weight:      212 pounds BMI:     31.42 O2 Sat:      97 % on Room air Temp:     98.1 degrees F oral Pulse rate:   54 / minute BP sitting:   120 / 80  (left arm) Cuff size:   large  Vitals Entered By: Ami Bullins CMA (May 21, 2009 3:59 PM)  O2 Flow:  Room air CC: pt here for evaluation of bradycardia/ ab   Primary Care Provider:  Gertie Broerman  CC:  pt here for evaluation of bradycardia/ ab.  History of Present Illness: seen urgently due to a report from Dr. Jarold Motto of a bradycardic rate to 35. Patient has a h/o bradycardia usually in the 50's especially since on B-blocker. He is very active physically and has had no symptoms: chest pain, syncope, breathlessness.   Current Medications (verified): 1)  Multivitamins   Tabs (Multiple Vitamin) .... Take One Tablet Daily 2)  Fish Oil 1000 Mg  Caps (Omega-3 Fatty Acids) .... Take 1  Tablets Daily 3)  Calcium 500 Mg  Tabs (Calcium) .... Take 1 Tablets Daily 4)  Vitamin C 500 Mg  Tabs (Ascorbic Acid) .... Take One Tablet Daily 5)  Fluticasone Propionate 50 Mcg/act  Susp (Fluticasone Propionate) .Marland Kitchen.. 1 Spray Per Nostril Once Daily 6)  Tylenol Extra Strength 500 Mg  Tabs (Acetaminophen) .... As Needed 7)  Furosemide 20 Mg Tabs (Furosemide) .Marland Kitchen.. 1 By Mouth Once Daily For Hypertension 8)  Toprol Xl 25 Mg Xr24h-Tab (Metoprolol Succinate) .Marland Kitchen.. 1 By Mouth Once Daily For Bp 9)  Finasteride 5 Mg Tabs (Finasteride) .Marland Kitchen.. 1 Tab Daily 10)  Claritin 10 Mg Tabs (Loratadine) .Marland Kitchen.. 1 Tab Daily  Allergies (verified): No Known Drug Allergies PMH-FH-SH reviewed-no changes except otherwise noted  Review of Systems  The patient denies anorexia, fever, weight loss, decreased hearing, hoarseness, chest pain, syncope, dyspnea on exertion, and abdominal pain.    Physical Exam  General:  Well-developed,well-nourished,in no acute distress;  alert,appropriate and cooperative throughout examination Heart:  regular rhythm, no murmur, no gallop, no JVD, and bradycardia.  with 1 minute step-test HR to 106, regular. Pt asymptomatic.    Impression & Recommendations:  Problem # 1:  BRADYCARDIA, CHRONIC (ICD-427.89) chronic, asymptomatic resting  bradycardia with a normal response to exercise.  No need for further evaluation.   His updated medication list for this problem includes:    Toprol Xl 25 Mg Xr24h-tab (Metoprolol succinate) .Marland Kitchen... 1 by mouth once daily for bp  Complete Medication List: 1)  Multivitamins Tabs (Multiple vitamin) .... Take one tablet daily 2)  Fish Oil 1000 Mg Caps (Omega-3 fatty acids) .... Take 1  tablets daily 3)  Calcium 500 Mg Tabs (Calcium) .... Take 1 tablets daily 4)  Vitamin C 500 Mg Tabs (Ascorbic acid) .... Take one tablet daily 5)  Fluticasone Propionate 50 Mcg/act Susp (Fluticasone propionate) .Marland Kitchen.. 1 spray per nostril once daily 6)  Tylenol Extra Strength 500 Mg Tabs (Acetaminophen) .... As needed 7)  Furosemide 20 Mg Tabs (Furosemide) .Marland Kitchen.. 1 by mouth once daily for hypertension 8)  Toprol Xl 25 Mg Xr24h-tab (Metoprolol succinate) .Marland Kitchen.. 1 by mouth once daily for bp 9)  Finasteride 5 Mg Tabs (Finasteride) .Marland Kitchen.. 1 tab daily 10)  Claritin 10 Mg Tabs (Loratadine) .Marland Kitchen.. 1 tab daily

## 2010-06-17 NOTE — Assessment & Plan Note (Signed)
Summary: Cardiology Nuclear Testing  Nuclear Med Background Indications for Stress Test: Evaluation for Ischemia   History: Echo, Myocardial Perfusion Study  History Comments: '02 Echo:normal LVF, moderate pulmonary HTN; '09 EAV:WUJWJX  Symptoms: Chest Pain, Chest Tightness, Dizziness, DOE, Fatigue, Palpitations  Symptoms Comments: Last episode of BJ:YNWG week.   Nuclear Pre-Procedure Cardiac Risk Factors: Hypertension, Smoker Caffeine/Decaff Intake: None NPO After: 6:00 PM Lungs: Clear IV 0.9% NS with Angio Cath: 22g     IV Site: R Antecubital IV Started by: Irean Hong, RN Chest Size (in) 44     Height (in): 69 Weight (lb): 209 BMI: 30.98 Tech Comments: Held toprol x 24 hours. Smokes occ. cigar.  Nuclear Med Study 1 or 2 day study:  1 day     Stress Test Type:  Stress Reading MD:  Cassell Clement, MD     Referring MD:  Illene Regulus, MD Resting Radionuclide:  Technetium 54m Tetrofosmin     Resting Radionuclide Dose:  11.0 mCi  Stress Radionuclide:  Technetium 27m Tetrofosmin     Stress Radionuclide Dose:  33.0 mCi   Stress Protocol Exercise Time (min):  9:46 min     Max HR:  139 bpm     Predicted Max HR:  145 bpm  Max Systolic BP: 186 mm Hg     Percent Max HR:  95.86 %     METS: 11.5 Rate Pressure Product:  95621    Stress Test Technologist:  Rea [employer], CMA-N     Nuclear Technologist:  Domenic Polite, CNMT  Rest Procedure  Myocardial perfusion imaging was performed at rest 45 minutes following the intravenous administration of Technetium 6m Tetrofosmin.  Stress Procedure  The patient exercised for 9:46.  The patient stopped due to fatigue and denied any chest pain.  There were no significant ST-T wave changes, only occasional PVC's and PAC's.  He had a mild  hypertensive response to exercise, 186/104.  Technetium 26m Tetrofosmin was injected at peak exercise and myocardial perfusion imaging was performed after a brief delay.  QPS Raw Data Images:  Normal;  no motion artifact; normal heart/lung ratio. Stress Images:  Normal homogeneous uptake in all areas of the myocardium. Rest Images:  Normal homogeneous uptake in all areas of the myocardium. Subtraction (SDS):  No evidence of ischemia. Transient Ischemic Dilatation:  0.86  (Normal <1.22)  Lung/Heart Ratio:  0.42  (Normal <0.45)  Quantitative Gated Spect Images QGS EDV:  103 ml QGS ESV:  33 ml QGS EF:  68 % QGS cine images:  No wall motion abnormalities.  Findings Normal nuclear study      Overall Impression  Exercise Capacity: Good exercise capacity. BP Response: Normal blood pressure response. Clinical Symptoms: No chest pain ECG Impression: No significant ST segment change suggestive of ischemia. Overall Impression: Normal stress nuclear study.  Appended Document: Cardiology Nuclear Testing copy sent to DR.NORINS

## 2010-06-17 NOTE — Assessment & Plan Note (Signed)
Summary: ear pain/cd   Vital Signs:  Patient profile:   77 year old male Height:      69 inches Weight:      215 pounds BMI:     31.86 O2 Sat:      97 % on Room air Temp:     97.7 degrees F oral Pulse rate:   60 / minute BP sitting:   126 / 80  (left arm) Cuff size:   large  Vitals Entered By: Bill Salinas CMA (May 11, 2010 1:53 PM)  O2 Flow:  Room air CC: pt here with stopped up left ear/ ab, Ear pain   Primary Care Provider:  Norins  CC:  pt here with stopped up left ear/ ab and Ear pain.  History of Present Illness:   Ear Pain      This is a 77 year old man who presents with Ear pain.  The symptoms began 1 week ago.  The severity is described as mild.  The patient also reports sensation of fullness and hearing loss, but denies ear discharge, tinnitus, fever, sinus pain, nasal discharge, and jaw click.  The pain is located in the left ear.  The pain is described as constant.  Associated symptoms include pressure.  The patient denies headache, night grinding of teeth, popping or crackling sounds, toothache, dizziness, and vertigo.  Patient's history includes chronic ear infections.  Prior treatment has included decongestants.    Current Medications (verified): 1)  Multivitamins   Tabs (Multiple Vitamin) .... Take One Tablet Daily 2)  Fish Oil 1000 Mg  Caps (Omega-3 Fatty Acids) .... Take 1  Tablets Daily 3)  Calcium 500 Mg  Tabs (Calcium) .... Take 1 Tablets Daily 4)  Vitamin C 500 Mg  Tabs (Ascorbic Acid) .... Take One Tablet Daily 5)  Fluticasone Propionate 50 Mcg/act  Susp (Fluticasone Propionate) .Marland Kitchen.. 1 Spray Per Nostril Once Daily 6)  Tylenol Extra Strength 500 Mg  Tabs (Acetaminophen) .... As Needed 7)  Furosemide 20 Mg Tabs (Furosemide) .Marland Kitchen.. 1 By Mouth Once Daily For Hypertension 8)  Atenolol 25 Mg Tabs (Atenolol) .Marland Kitchen.. 1 Tab Once Daily 9)  Finasteride 5 Mg Tabs (Finasteride) .Marland Kitchen.. 1 Tab Daily 10)  Claritin 10 Mg Tabs (Loratadine) .Marland Kitchen.. 1 Tab Daily  Allergies  (verified): No Known Drug Allergies  Past History:  Past Medical History: Last updated: 01/24/2007 SKIN CANCER, HX OF (ICD-V10.83) ATHEROSCLEROSIS, AORTIC (ICD-440.0) Hx of ARTHRITIS,MILD (ICD-716.90) PROSTATITIS, RECURRENT (ICD-601.9) HIATAL HERNIA, HX OF (ICD-V12.79) HAY FEVER (ICD-477.0) POLYP, COLON (ICD-211.3)                                                                     Past Surgical History: Last updated: 01/24/2007 INGUINAL HERNIORRHAPHY, RIGHT, HX OF (ICD-V45.89) INGUINAL HERNIORRHAPHY, LEFT, HX OF (ICD-V45.89) TONSILLECTOMY, HX OF (ICD-V45.79) ORIF FRACTURE OF THE ELBOW FH reviewed for relevance, SH/Risk Factors reviewed for relevance  Review of Systems       The patient complains of decreased hearing.  The patient denies anorexia, fever, weight loss, weight gain, hoarseness, chest pain, dyspnea on exertion, headaches, abdominal pain, and enlarged lymph nodes.   ENT:  Complains of decreased hearing; denies difficulty swallowing, ear discharge, earache, hoarseness, nasal congestion, nosebleeds, postnasal drainage, ringing in ears, sinus  pressure, and sore throat.  Physical Exam  General:  Well-developed,well-nourished,in no acute distress; alert,appropriate and cooperative throughout examination Head:  Normocephalic and atraumatic without obvious abnormalities. No apparent alopecia or balding. Eyes:  C&S clear Ears:  left EAC clear after irrigation. TM appears scared with tense. No frank erythema, perforation.  Lungs:  normal respiratory effort and normal breath sounds.   Heart:  normal rate and regular rhythm.   Neurologic:  alert & oriented X3, cranial nerves II-XII intact, and gait normal.   Skin:  turgor normal and color normal.   Cervical Nodes:  no anterior cervical adenopathy and no posterior cervical adenopathy.   Psych:  normally interactive and good eye contact.     Impression &  Recommendations:  Problem # 1:  HEARING LOSS, LEFT EAR (ICD-389.9) [place] with muffdled hearing left ear with pressure. He did have cerumen impaction which irrigated without difficulty. The TM does appear scarred and he does admit to a history of ear infections in his youth. He does have some pressure in the ear. Susp[ect loss was combination of cerumen impaction plus eustachian tube dysfunction.  Plan - ear maintenance with ear cleaning kit; no Q-tips           decongestant - sudafed 30mg  three times a day until symptoms better.            for possible allergic component - fluticasone 1 spray to each nostril once daily.   Complete Medication List: 1)  Multivitamins Tabs (Multiple vitamin) .... Take one tablet daily 2)  Fish Oil 1000 Mg Caps (Omega-3 fatty acids) .... Take 1  tablets daily 3)  Calcium 500 Mg Tabs (Calcium) .... Take 1 tablets daily 4)  Vitamin C 500 Mg Tabs (Ascorbic acid) .... Take one tablet daily 5)  Fluticasone Propionate 50 Mcg/act Susp (Fluticasone propionate) .Marland Kitchen.. 1 spray per nostril once daily 6)  Tylenol Extra Strength 500 Mg Tabs (Acetaminophen) .... As needed 7)  Furosemide 20 Mg Tabs (Furosemide) .Marland Kitchen.. 1 by mouth once daily for hypertension 8)  Atenolol 25 Mg Tabs (Atenolol) .Marland Kitchen.. 1 tab once daily 9)  Finasteride 5 Mg Tabs (Finasteride) .Marland Kitchen.. 1 tab daily 10)  Claritin 10 Mg Tabs (Loratadine) .Marland Kitchen.. 1 tab daily Prescriptions: FLUTICASONE PROPIONATE 50 MCG/ACT  SUSP (FLUTICASONE PROPIONATE) 1 spray per nostril once daily  #1 bottle x 12   Entered and Authorized by:   Jacques Navy MD   Signed by:   Jacques Navy MD on 05/11/2010   Method used:   Electronically to        [facility]* (retail)       [address]       [place], [place]  [id number]       Ph: [phone]       Fax: [phone]   RxID:   [id number]    Orders Added: 1)  Est. Patient Level III [24401] 2)  Est. Patient Level III [02725]

## 2010-06-17 NOTE — Progress Notes (Signed)
Summary: nuc pre procedure  Phone Note Outgoing Call Call back at Home Phone [phone]   Call placed by: Cathlyn Parsons RN,  April 27, 2010 4:13 PM Call placed to: Patient Reason for Call: Confirm/change Appt Summary of Call: Reviewed information on My[employer] Information Sheet (see scanned document for further details).  Spoke with patient.     Nuclear Med Background Indications for Stress Test: Evaluation for Ischemia   History: Echo  History Comments: 02 Echo EF=nl and mod pulm HTN 09 MPS nl  Symptoms: Chest Pain, Palpitations    Nuclear Pre-Procedure Cardiac Risk Factors: Hypertension, Smoker Height (in): 69

## 2010-06-17 NOTE — Progress Notes (Signed)
  Phone Note Outgoing Call   Reason for Call: Discuss lab or test results Summary of Call: please call patient - normal nuclear stress test - no evidence of block coronaries.  Thanks Initial call taken by: Jacques Navy MD,  May 02, 2010 10:06 PM  Follow-up for Phone Call        informed pt Follow-up by: Ami Bullins CMA,  May 03, 2010 5:14 PM

## 2010-06-28 ENCOUNTER — Encounter: Payer: Self-pay | Admitting: Internal Medicine

## 2010-07-13 NOTE — Letter (Signed)
Summary: Delon Sacramento MD/[facility]  Delon Sacramento MD/[facility]   Imported By: Lester Verona 07/06/2010 07:22:04  _____________________________________________________________________  External Attachment:    Type:   Image     Comment:   External Document

## 2010-07-15 ENCOUNTER — Encounter: Payer: Self-pay | Admitting: Internal Medicine

## 2010-07-15 ENCOUNTER — Ambulatory Visit (INDEPENDENT_AMBULATORY_CARE_PROVIDER_SITE_OTHER): Payer: Medicare Other

## 2010-07-15 DIAGNOSIS — H6120 Impacted cerumen, unspecified ear: Secondary | ICD-10-CM

## 2010-07-16 ENCOUNTER — Ambulatory Visit (HOSPITAL_BASED_OUTPATIENT_CLINIC_OR_DEPARTMENT_OTHER)
Admission: RE | Admit: 2010-07-16 | Discharge: 2010-07-16 | Disposition: A | Discharge status: Home / Self Care | Payer: Medicare Other | Source: Ambulatory Visit | Attending: Orthopedic Surgery | Admitting: Orthopedic Surgery

## 2010-07-16 DIAGNOSIS — Z01812 Encounter for preprocedural laboratory examination: Secondary | ICD-10-CM | POA: Insufficient documentation

## 2010-07-16 DIAGNOSIS — M23359 Other meniscus derangements, posterior horn of lateral meniscus, unspecified knee: Secondary | ICD-10-CM | POA: Insufficient documentation

## 2010-07-16 DIAGNOSIS — M2240 Chondromalacia patellae, unspecified knee: Secondary | ICD-10-CM | POA: Insufficient documentation

## 2010-07-16 DIAGNOSIS — M23349 Other meniscus derangements, anterior horn of lateral meniscus, unspecified knee: Secondary | ICD-10-CM | POA: Insufficient documentation

## 2010-07-19 LAB — POCT I-STAT 4, (NA,K, GLUC, HGB,HCT)
Hemoglobin: 15.3 g/dL (ref 13.0–17.0)
Sodium: 142 mEq/L (ref 135–145)

## 2010-07-22 NOTE — Assessment & Plan Note (Signed)
Summary: left ear irrigation/Marquavius Scaife/cd  Nurse Visit  CC: Ear irrigation - left/SD Comments Successful left ear irrigation. Pt will f/u w/PCP if ear congestion continues. He has not tried the sudafed as prescribed by MD at last office visit but did need his left ear irrigated again. ................Marland KitchenLamar Sprinkles, CMA  July 15, 2010 10:28 AM    Allergies: No Known Drug Allergies  Orders Added: 1)  Est. Patient Level I [[address]]

## 2010-07-22 NOTE — Op Note (Signed)
  NAMEJERRIT, HOREN                ACCOUNT NO.:  [id number]  MEDICAL RECORD NO.:  [id number]           PATIENT TYPE:  LOCATION:                                 FACILITY:  PHYSICIAN:  Ollen Gross, M.D.         DATE OF BIRTH:  DATE OF PROCEDURE:  07/16/2010 DATE OF DISCHARGE:                              OPERATIVE REPORT   PREOPERATIVE DIAGNOSIS:  Right knee lateral meniscal tear.  POSTOPERATIVE DIAGNOSIS:  Right knee lateral meniscal tear.  PROCEDURE:  Right knee arthroscopy, lateral meniscal debridement.  SURGEON:  Ollen Gross, M.D.  ASSISTANT:  None.  ANESTHESIA:  General.  ESTIMATED BLOOD LOSS:  Minimal.  DRAINS:  None.  COMPLICATIONS:  None.  CONDITION:  Stable to Recovery.  BRIEF CLINICAL NOTE:  Mr. Reinheimer is a 77 year old male, who has significant right knee pain and mechanical symptoms.  Exam and history were consistent with a lateral meniscal tear confirmed by MRI.  He presents for arthroscopy and debridement. PROCEDURE IN DETAIL:  After successful administration of general anesthetic, tourniquet placed high on the right thigh and right lower extremity prepped and draped in the usual sterile fashion.  Standard superomedial and inferolateral incisions were made and inflow cannula passed superomedial, camera passed inferolateral.  Arthroscopic visualization proceeds.  Undersurface of patella and trochlea showed some mild chondromalacia, otherwise looked normal.  Medial and lateral gutters were visualized and no loose bodies.  Flexion valgus force applied to knee.  Medial compartment is entered.  The medial compartment looks normal.  Spinal needles were used to localize the inferomedial portal.  Small incision made and dilator placed.  The medial compartment is further inspected and it looks normal.  Intercondylar notch is visualized.  The ACL is normal.  Lateral compartment centered is significant tear in the anterior horn body and posterior horn  lateral meniscus.  It is unstable in nature.  It was debrided back to a stable base with baskets and 4.2 mm shaver and then sealed off with the ArthroCare device.  It is found to be stable after that.  The joints again inspected.  No other tears, defects or loose bodies were noted. Note that the chondral surfaces in the lateral compartment looked normal.  The arthroscopic equipment was subsequently removed from the inferior portals, which were closed with interrupted 4-0 nylon.  The 20 cc of 0.25% Marcaine with epi was injected through the inflow cannula then that is removed and that portal closed with nylon.  The incision was then cleaned and dried and bulky sterile dressing were was applied.  He was then awakened and transported to Recovery in stable condition.     Ollen Gross, M.D.     FA/MEDQ  D:  07/16/2010  T:  07/16/2010  Job:  [id number]  Electronically Signed by Ollen Gross M.D. on 07/21/2010 03:47:05 PM

## 2010-10-01 NOTE — Op Note (Signed)
NAMEMEDHANSH, BRINKMEIER                ACCOUNT NO.:  [id number]   MEDICAL RECORD NO.:  [id number]          PATIENT TYPE:  AMB   LOCATION:  DAY                          FACILITY:  [facility]   PHYSICIAN:  Adolph Pollack, M.D.DATE OF BIRTH:  Jan 11, 1933   DATE OF PROCEDURE:  04/03/2006  DATE OF DISCHARGE:                               OPERATIVE REPORT   PREOP DIAGNOSIS:  Left inguinal hernia.   POSTOPERATIVE DIAGNOSIS:  Left inguinal hernia.   PROCEDURE:  Laparoscopic left inguinal hernia with mesh.   SURGEON:  Adolph Pollack, M.D.   ANESTHESIA:  General.   INDICATIONS:  This 77 year old male had a previous right inguinal hernia  repair; and has now developed a symptomatic left inguinal hernia.  He  would like to have laparoscopic repair and thus presents for it.  We  have discussed the procedure risks and aftercare preoperatively.   TECHNIQUE:  He was seen in the holding area and the right groin marked  with my initials.  He then voided; and was taken to the operating room;  placed supine on the operating table; and a general anesthetic was  administered.  The hair in the lower abdominal wall and groin was  clipped; and the area sterilely prepped and draped.  Dilute Marcaine  solution was infiltrated in the subumbilical region; and a transverse  subumbilical incision was made to the skin and subcutaneous tissue.  The  left anterior rectus sheath was identified; and a small incision made  in.  The underlying left rectus muscles were then swept laterally  exposing the posterior sheath.  A balloon dissection device was then  placed in the extraperitoneal space under laparoscopic vision.  A  balloon dissection was performed of the midline and the left  extraperitoneal area.   The balloon dissection device was then removed and CO2 gas was  insufflated creating a working area.  The laparoscope was introduced;  and under direct vision, two 5-mm trocars were placed into the space  just to the right of the midline.  Using blunt dissection, I identified  the symphysis pubis and Cooper's ligament.  I then dissected fibrofatty  tissue away from the anterior and lateral abdominal walls up to the  level of the umbilicus.  Direct hernia defect was noted and the  extraperitoneal fat reduced from it.  I then isolated the spermatic  cord, and stripped the peritoneum back to the level of the umbilicus.  A  window was made around the spermatic cord.   A piece of 5 x 6 inch mesh was then brought into the field (Parietex  TECR 15 x 15).  A slit was cut in the mesh; and then it was placed into  the extraperitoneal space; and positioned so that the two tails were  wrapped around the cord.  Mesh was then anchored to Cooper's ligament,  and the anterior lateral abdominal walls with spiral tacks.  This more  than adequately covered the direct, indirect, and femoral spaces with  adequate overlap.   Following this, I noted hemostasis to be adequate.  I then held down the  inferior aspect of the mesh and released the CO2 gas and watched the  extraperitoneal contents approximate the mesh.  The trocars were then  removed.  The  anterior rectus sheath defect was closed with interrupted #0 Vicryl  sutures.  The skin was closed with a Monocryl subcuticular stitch,  followed by Steri-Strips and sterile dressing.   He tolerated the procedure well without apparent complications; and was  taken to recovery in satisfactory condition.      Adolph Pollack, M.D.  Electronically Signed     TJR/MEDQ  D:  04/03/2006  T:  04/03/2006  Job:  [id number]   cc:   Rosalyn Gess. Norins, MD  [address]  [place]  [place] [address]

## 2010-10-01 NOTE — Letter (Signed)
January 05, 2006     Adolph Pollack, MD  [phone] [address], [address]  [place], [place] [address]   RE:  Donald, [name]  MRN:  [id number]  /  DOB:  [birth year]   Dear Tawanna Cooler:   Thank you very much for seeing Donald [name], a very pleasant 77 year old  Caucasian gentleman, for evaluation of a left inguinal hernia.   Donald [name] had been noted on previous physical exam to have what I thought was  an early hernia in the left inguinal canal, January 29, 2005.  The patient  presented to the office today and reports that he has had an episode where  he had significant pain and discomfort in the left groin area while playing  golf, which resolved when he went home, took some rest.  On examining him in  the office today, I feel that this inguinal hernia on the left is definitely  enlarging.  It was minimally tend on examination.  Both the patient and I  feel that early evaluation for conservative planning and repair is  reasonable.   PAST MEDICAL HISTORY:   SURGICAL:  1. Tonsillectomy in 1942.  2. Operative repair, internal fixation of fractured elbow in 1993.  3. Right inguinal hernia repair in 1994.   MEDICAL ILLNESSES:  1. The usual childhood diseases.  2. History of skin cancer.  3. Mild arthritis, with no disability.  4. Hiatal hernia, with mild reflux.  5. Hay fever.  6. Aortic annular calcification without regurgitation by 2D      echocardiogram.  7. Colon polyps by colonoscopy.   CURRENT MEDICATIONS:  No prescription drugs.  The patient uses  multivitamins, fish oil, and calcium.   HABITS:  Tobacco:  None, with the patient having quit 10-12 years ago.  Alcohol use averages 2 ounces per day.   FAMILY HISTORY:  Significant for ovarian cancer in his mother.  Brother had  prostate cancer but died of a hematologic disorder.  Father had renal  disease but lived to be 16.   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  He has been married for 46  years.  He is a father, with  several grandchildren.   If I can provide any additional information in regard to this patient,  please do not hesitate to contact me.  As always, I appreciate your care of  these nice folks, and look forward to hearing from you.    Sincerely,      Rosalyn Gess. Norins, MD   MEN/MedQ  DD:  01/05/2006  DT:  01/05/2006  Job #:  [id number]

## 2010-10-01 NOTE — Assessment & Plan Note (Signed)
[facility]                           PRIMARY CARE OFFICE NOTE   Avraj, Lindroth ANNIE ROSEBOOM                       MRN:          [id number]  DATE:09/15/2006                            DOB:          09/06/1932    HISTORY OF PRESENT ILLNESS:  Mr. Begay is a 77 year old gentleman who  presents for followup evaluation and examination. He was last seen  April 07, 2006 for problems with postoperative voiding difficulty,  secondary to BPH. The patient had been started on Flomax 0.4 mg with  good results, however, this caused impotence and therefore, he was  managing to get by with 3 doses a week. However, wife reports that he is  feeling well and doing well.   PAST SURGICAL HISTORY:  1. Tonsillectomy in 1942.  2. ORIF fracture of elbow in 1993.  3. Right herniorrhaphy in 1994.   PAST MEDICAL HISTORY:  1. Usual childhood disease but mumps as an adult.  2. An issue of skin cancer.  3. Mild arthritis with no disability.  4. Hiatal hernia with mild reflux.  5. Hayfever.  6. Aortic calcification.  7. Colon polyps.  8. Recurrent prostatitis.   SOCIAL HISTORY:  The patient has a 30 pack year smoking history but quit  20 years ago and completely quit tobacco 12 years ago. ETOH averaging 2  ounces per day. No recreational drug use.   FAMILY HISTORY:  Mother with ovarian cancer. Brother with prostate  cancer but died of a hematologic disorder after radiation therapy.  Father with renal disease, died at age 73 of failure to thrive. No  family history for colon cancer, lung cancer, heart disease, or  diabetes.   SOCIAL HISTORY:  The patient graduated from [employer]. [place] with a degree in  Patent attorney. The patient is married for 47 years. He has 2  sons, 1 daughter, and multiple grandchildren. 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 He is retired. He enjoys golfing. 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   REVIEW OF SYSTEMS:  The patient has had no constitutional,  cardiovascular, respiratory, GI, or GU problems.   PHYSICAL EXAMINATION:  VITAL SIGNS:  Temperature 97.4, blood pressure  137/77, pulse 53, weight 205.  GENERAL:  A well developed, well nourished, Caucasian male who looks his  stated age in no acute distress.  HEENT:  Normocephalic and atraumatic. EAC's and TM's were unremarkable.  Oropharynx with native dentition, in good repair. No buccal or palate  lesions were noted. Posterior pharynx was clear. Conjunctivae, sclerae  clear. PERRLA. EO MI. Funduscopic exam was unremarkable.  NECK:  Supple without thyromegaly.  NODES:  No adenopathy was noted of the cervical or supraclavicular  regions.  CHEST:  No CVA tenderness.  LUNGS:  Clear to auscultation and percussion.  CARDIOVASCULAR:  2+ radial pulses. No JVD, no carotid bruits. He had a  quiet precordium with regular rate and rhythm without murmur, rub, or  gallop.  ABDOMEN:  Soft. No  guarding, no rebound. No organo-splenomegaly was  appreciated.  GENITOURINARY:  Genitalia, bilaterally distended testicles without  masses. Normal penis.  RECTAL:  Normal sphincter tone was noted. The prostate was 3+ in size,  smooth, but here is an area of firmness or induration at the right lobe  that was non-tender.  MUSCULOSKELETAL:  Examination unremarkable.  NEUROLOGIC:  Examination was normal.  DERMATOLOGIC:  Examination was unremarkable.   LABORATORY DATA:  The patient did have recent laboratory with surgery  November 2007, which revealed normal electrolytes. Blood sugar was 117.  Liver functions were normal. CBC was unremarkable. Hemoglobin 15.9.  White count was 5,700. The patient has not had a problem with lipids and  the lipid panel was not indicated at this time. Fast lipid panel from  September 2006 revealed an LDL of 138, a total cholesterol of 196.   ASSESSMENT:   1. The patient is stable at this time.  2. Allergies:  The patient is doing well with Fluticasone.  3. Genitourinary:  The patient has been getting good result with      Flomax but is troubled by impotence. Therefore, the plan is to      switch him to Proscar 5 mg daily. He will continue Proscar 3 times      a week for at least another 4 to 8 weeks.  4. Abnormal prostate examination:  The patient did have a PSA today      that was 3.71. Last PSA was 1.69 in September of '06. This gives      him an interval acceleration of 2.0 above the threshold of 0.7 per      year increase.   PLAN:  Given the patient's fullness of the prostate at the right lobe,  would recommend followup with urology. Will contact the patient and get  his urologist of choice and confirm whether he agrees with further  consultation.     Rosalyn Gess Norins, MD  Electronically Signed    MEN/MedQ  DD: 09/16/2006  DT: 09/16/2006  Job #: [id number]

## 2010-10-18 ENCOUNTER — Encounter: Payer: Self-pay | Admitting: Internal Medicine

## 2010-10-18 ENCOUNTER — Ambulatory Visit (INDEPENDENT_AMBULATORY_CARE_PROVIDER_SITE_OTHER): Payer: Medicare Other | Admitting: Internal Medicine

## 2010-10-18 VITALS — BP 148/78 | HR 47 | Temp 98.2°F | Resp 12 | Wt 210.2 lb

## 2010-10-18 DIAGNOSIS — I10 Essential (primary) hypertension: Secondary | ICD-10-CM

## 2010-10-18 MED ORDER — LOSARTAN POTASSIUM 25 MG PO TABS: 25.0000 mg | ORAL_TABLET | Freq: Every day | ORAL | Status: DC

## 2010-10-18 NOTE — Patient Instructions (Signed)
It was good to see you today. We have reviewed your prior records including labs and tests today Start losartan in addition to other medications as ongoing - Your prescription(s) have been submitted to your pharmacy. Please take as directed and contact our office if you believe you are having problem(s) with the medication(s). Please schedule followup in 2 weeks with Norins, call sooner if problems.

## 2010-10-18 NOTE — Progress Notes (Signed)
  Subjective:    Patient ID: Donald [name], male    DOB: 06/05/1932, 77 y.o.   MRN: [id number]  HPI Here for elevated BP -  Hx same, no medication changes, compliant as rx'd ?precipitated by cortisone injection R knee last week - Allusio Continued right knee pain, mild R ankle swelling - no trauma    Past Medical History  Diagnosis Date  . Hypertension   . Bradycardia   . Allergic rhinitis, cause unspecified   . ED (erectile dysfunction)       Review of Systems  Respiratory: Negative for shortness of breath.   Cardiovascular: Negative for chest pain.  Neurological: Negative for headaches.       Objective:   Physical Exam BP 148/78  Pulse 47  Temp(Src) 98.2 F (36.8 C) (Oral)  Resp 12  Wt 210 lb 4 oz (95.369 kg)  SpO2 98% BP Readings from Last 3 Encounters:  10/18/10 148/78  05/11/10 126/80  04/14/10 110/62    Physical Exam  Constitutional:  oriented to person, place, and time. appears well-developed and well-nourished. No distress.  Neck: Normal range of motion. Neck supple. No JVD present. No thyromegaly present.  Cardiovascular: slightly brady rate, regular rhythm and normal heart sounds.  No murmur heard. RLE with trace edema, LLE without edema Pulmonary/Chest: Effort normal and breath sounds normal. No respiratory distress. no wheezes.  Psychiatric: he has a normal mood and affect. behavior is normal. Judgment and thought content normal.       Lab Results  Component Value Date   WBC 8.1 04/13/2009   HGB 15.3 07/16/2010   HCT 45.0 07/16/2010   PLT 166.0 04/13/2009   CHOL 221* 04/14/2010   TRIG 84.0 04/14/2010   HDL 58.50 04/14/2010   LDLDIRECT 139.1 04/14/2010   NA 142 07/16/2010   K 4.2 07/16/2010   CL 103 04/14/2010   CREATININE 1.1 04/14/2010   BUN 18 04/14/2010   CO2 29 04/14/2010   PSA 5.23* 04/11/2008      Assessment & Plan:  HTN - ?acute elevation precipitated by knee pain and steroid injection 5 days ago - exam/hx otherwise benign - add ARB  to current meds and recheck with PCP in 2 weeks on BP and med review

## 2010-10-29 ENCOUNTER — Encounter: Payer: Self-pay | Admitting: Internal Medicine

## 2010-11-01 ENCOUNTER — Ambulatory Visit (INDEPENDENT_AMBULATORY_CARE_PROVIDER_SITE_OTHER): Payer: Medicare Other | Admitting: Internal Medicine

## 2010-11-01 DIAGNOSIS — I10 Essential (primary) hypertension: Secondary | ICD-10-CM

## 2010-11-01 NOTE — Progress Notes (Signed)
  Subjective:    Patient ID: Donald [name], male    DOB: 1932/10/17, 77 y.o.   MRN: [id number]  HPI Donald [name] was seen June 4 for elevated BP. Reviewed Dr. Diamantina Monks note. He had mild elevation in the office but it had been much higher at home, in the 170's. He was started on losartan and has tolerated this well. His BP has been doing well.   PMH, FamHx and SocHx reviewed for any changes and relevance.    Review of Systems .Review of Systems  Constitutional:  Negative for fever, chills, activity change and unexpected weight change.  HEENT:  Negative for hearing loss, ear pain, congestion, neck stiffness and postnasal drip. Negative for sore throat or swallowing problems. Negative for dental complaints.   Eyes: Negative for vision loss or change in visual acuity.  Respiratory: Negative for chest tightness and wheezing.   Cardiovascular: Negative for chest pain and palpitationNo decreased exercise tolerance Gastrointestinal: No change in bowel habit. No bloating or gas. No reflux or indigestion Genitourinary: Negative for urgency, frequency, flank pain and difficulty urinating.  Musculoskeletal: Negative for myalgias, back pain, arthralgias and gait problem.  Neurological: Negative for dizziness, tremors, weakness and headaches.  Hematological: Negative for adenopathy.  Psychiatric/Behavioral: Negative for behavioral problems and dysphoric mood.       Objective:   Physical Exam Vitals reviewed - BP is great Gen'l - WNWD white male.  HEENT- nl , C&S clear PUl- normal respirations Cor - RRR Derm - superficial varicosities both legs.        Assessment & Plan:

## 2010-11-02 NOTE — Assessment & Plan Note (Signed)
Excellent control on new medical regimen. He is tolerating medication without problems  Plan - continue present regimen.           Consider drug holiday if he is able to resume his previous aerobic exercise program.

## 2010-11-10 ENCOUNTER — Other Ambulatory Visit: Payer: Self-pay | Admitting: *Deleted

## 2010-11-10 DIAGNOSIS — I10 Essential (primary) hypertension: Secondary | ICD-10-CM

## 2010-11-10 MED ORDER — LOSARTAN POTASSIUM 25 MG PO TABS: 25.0000 mg | ORAL_TABLET | Freq: Every day | ORAL | Status: DC

## 2011-02-16 ENCOUNTER — Other Ambulatory Visit: Payer: Self-pay | Admitting: Internal Medicine

## 2011-02-23 ENCOUNTER — Ambulatory Visit (INDEPENDENT_AMBULATORY_CARE_PROVIDER_SITE_OTHER): Payer: Medicare Other | Admitting: *Deleted

## 2011-02-23 DIAGNOSIS — Z23 Encounter for immunization: Secondary | ICD-10-CM

## 2011-03-11 ENCOUNTER — Encounter: Payer: Self-pay | Admitting: Internal Medicine

## 2011-03-11 ENCOUNTER — Ambulatory Visit (INDEPENDENT_AMBULATORY_CARE_PROVIDER_SITE_OTHER): Payer: Medicare Other | Admitting: Internal Medicine

## 2011-03-11 VITALS — BP 122/60 | HR 56 | Temp 98.2°F

## 2011-03-11 DIAGNOSIS — J069 Acute upper respiratory infection, unspecified: Secondary | ICD-10-CM

## 2011-03-11 MED ORDER — AZITHROMYCIN 250 MG PO TABS: ORAL_TABLET | ORAL | Status: AC

## 2011-03-11 NOTE — Progress Notes (Signed)
  Subjective:    Patient ID: Donald [name], male    DOB: [birth year], 77 y.o.   MRN: [id number]  Sore Throat  This is a new problem. The current episode started in the past 7 days. The problem has been gradually worsening. Maximum temperature: subjective. The fever has been present for 1 to 2 days. The pain is at a severity of 4/10. Associated symptoms include congestion, coughing, a hoarse voice and swollen glands. Pertinent negatives include no ear pain, headaches, plugged ear sensation, shortness of breath or trouble swallowing. He has had exposure to strep. He has had no exposure to mono. Exposure to: exposure grandkid with same. He has tried cool liquids for the symptoms. The treatment provided no relief.   Past Medical History  Diagnosis Date  . Hypertension   . Bradycardia   . ED (erectile dysfunction)   . Personal history of other malignant neoplasm of skin   . Embolism and thrombosis of abdominal aorta   . Arthropathy, unspecified, site unspecified   . Prostatitis, unspecified   . Personal history of other diseases of digestive system   . Allergic rhinitis due to pollen   . Benign neoplasm of colon     Review of Systems  HENT: Positive for congestion and hoarse voice. Negative for ear pain and trouble swallowing.   Respiratory: Positive for cough. Negative for shortness of breath.   Neurological: Negative for headaches.       Objective:   Physical Exam BP 122/60  Pulse 56  Temp(Src) 98.2 F (36.8 C) (Oral)  SpO2 96%  Constitutional:  He appears well-developed and well-nourished. Tired and mild hoarse but no distress.  HENT: NCAT - OP with mild erythema, no exudate or vessic - clear rhinorrhea, no purulence, no sinus tenderness Neck: Normal range of motion. Neck supple. No JVD or LAD present. No thyromegaly present.  Cardiovascular: Normal rate, regular rhythm and normal heart sounds.  No murmur heard. no BLE edema Pulmonary/Chest: Effort normal and breath sounds normal.  No respiratory distress. no wheezes.  Neurological: he is alert and oriented to person, place, and time. No cranial nerve deficit. Coordination normal.  Skin: Skin is warm and dry.  No erythema or ulceration.  Psychiatric: he has a normal mood and affect. behavior is normal. Judgment and thought content normal.       Assessment & Plan:  Viral URI - no evidence of bacterial infection at this time, advised symptomatic care and over-the-counter remedies but also provided him empiric Z-Pak to use if sputum became purulent or high-grade fever develops

## 2011-03-11 NOTE — Patient Instructions (Addendum)
It was good to see you today. Zpak to use if symptoms become worse in next 48h, but it does not appear necessary to use antibiotics at this time. Your prescription(s) have been submitted to your pharmacy. Please take as directed and contact our office if you believe you are having problem(s) with the medication(s).   Upper Respiratory Infection, Adult An upper respiratory infection (URI) is also sometimes known as the common cold. The upper respiratory tract includes the nose, sinuses, throat, trachea, and bronchi. Bronchi are the airways leading to the lungs. Most people improve within 1 week, but symptoms can last up to 2 weeks. A residual cough may last even longer.   CAUSES Many different viruses can infect the tissues lining the upper respiratory tract. The tissues become irritated and inflamed and often become very moist. Mucus production is also common. A cold is contagious. You can easily spread the virus to others by oral contact. This includes kissing, sharing a glass, coughing, or sneezing. Touching your mouth or nose and then touching a surface, which is then touched by another person, can also spread the virus. SYMPTOMS   Symptoms typically develop 1 to 3 days after you come in contact with a cold virus. Symptoms vary from person to person. They may include:  Runny nose.     Sneezing.    Nasal congestion.     Sinus irritation.     Sore throat.     Loss of voice (laryngitis).     Cough.    Fatigue.    Muscle aches.     Loss of appetite.     Headache.    Low-grade fever.  DIAGNOSIS   You might diagnose your own cold based on familiar symptoms, since most people get a cold 2 to 3 times a year. Your caregiver can confirm this based on your exam. Most importantly, your caregiver can check that your symptoms are not due to another disease such as strep throat, sinusitis, pneumonia, asthma, or epiglottitis. Blood tests, throat tests, and X-rays are not necessary to diagnose  a common cold, but they may sometimes be helpful in excluding other more serious diseases. Your caregiver will decide if any further tests are required. RISKS AND COMPLICATIONS   You may be at risk for a more severe case of the common cold if you smoke cigarettes, have chronic heart disease (such as heart failure) or lung disease (such as asthma), or if you have a weakened immune system. The very young and very old are also at risk for more serious infections. Bacterial sinusitis, middle ear infections, and bacterial pneumonia can complicate the common cold. The common cold can worsen asthma and chronic obstructive pulmonary disease (COPD). Sometimes, these complications can require emergency medical care and may be life-threatening. PREVENTION   The best way to protect against getting a cold is to practice good hygiene. Avoid oral or hand contact with people with cold symptoms. Wash your hands often if contact occurs. There is no clear evidence that vitamin C, vitamin E, echinacea, or exercise reduces the chance of developing a cold. However, it is always recommended to get plenty of rest and practice good nutrition. TREATMENT   Treatment is directed at relieving symptoms. There is no cure. Antibiotics are not effective, because the infection is caused by a virus, not by bacteria. Treatment may include:  Increased fluid intake. Sports drinks offer valuable electrolytes, sugars, and fluids.     Breathing heated mist or steam (vaporizer or shower).  Eating chicken soup or other clear broths, and maintaining good nutrition.     Getting plenty of rest.     Using gargles or lozenges for comfort.     Controlling fevers with ibuprofen or acetaminophen as directed by your caregiver.     Increasing usage of your inhaler if you have asthma.  Zinc gel and zinc lozenges, taken in the first 24 hours of the common cold, can shorten the duration and lessen the severity of symptoms. Pain medicines may help  with fever, muscle aches, and throat pain. A variety of non-prescription medicines are available to treat congestion and runny nose. Your caregiver can make recommendations and may suggest nasal or lung inhalers for other symptoms.   HOME CARE INSTRUCTIONS    Only take over-the-counter or prescription medicines for pain, discomfort, or fever as directed by your caregiver.     Use a warm mist humidifier or inhale steam from a shower to increase air moisture. This may keep secretions moist and make it easier to breathe.     Drink enough water and fluids to keep your urine clear or pale yellow.     Rest as needed.     Return to work when your temperature has returned to normal or as your caregiver advises. You may need to stay home longer to avoid infecting others. You can also use a face mask and careful hand washing to prevent spread of the virus.  SEEK MEDICAL CARE IF:    After the first few days, you feel you are getting worse rather than better.     You need your caregiver's advice about medicines to control symptoms.     You develop chills, worsening shortness of breath, or brown or red sputum. These may be signs of pneumonia.     You develop yellow or brown nasal discharge or pain in the face, especially when you bend forward. These may be signs of sinusitis.     You develop a fever, swollen neck glands, pain with swallowing, or white areas in the back of your throat. These may be signs of strep throat.  SEEK IMMEDIATE MEDICAL CARE IF:    You have a fever.     You develop severe or persistent headache, ear pain, sinus pain, or chest pain.     You develop wheezing, a prolonged cough, cough up blood, or have a change in your usual mucus (if you have chronic lung disease).     You develop sore muscles or a stiff neck.  Document Released: 10/26/2000 Document Revised: 01/12/2011 Document Reviewed: 09/03/2010 [facility] Patient Information 2012 [place], [place].Salt Water Gargle This  solution will help make your mouth and throat feel better. HOME CARE INSTRUCTIONS    Mix 1 teaspoon of salt in 8 ounces of warm water.     Gargle with this solution as much or often as you need or as directed. Swish and gargle gently if you have any sores or wounds in your mouth.     Do not swallow this mixture.  Document Released: 02/04/2004 Document Revised: 01/12/2011 Document Reviewed: 06/27/2008 [facility] Patient Information 2012 [place], [place].

## 2011-03-14 ENCOUNTER — Ambulatory Visit: Payer: Medicare Other | Admitting: Internal Medicine

## 2011-04-18 ENCOUNTER — Other Ambulatory Visit (INDEPENDENT_AMBULATORY_CARE_PROVIDER_SITE_OTHER): Payer: Medicare Other

## 2011-04-18 ENCOUNTER — Other Ambulatory Visit: Payer: Self-pay | Admitting: Internal Medicine

## 2011-04-18 ENCOUNTER — Encounter: Payer: Self-pay | Admitting: Internal Medicine

## 2011-04-18 ENCOUNTER — Ambulatory Visit (INDEPENDENT_AMBULATORY_CARE_PROVIDER_SITE_OTHER): Payer: Medicare Other | Admitting: Internal Medicine

## 2011-04-18 DIAGNOSIS — Z0000 Encounter for general adult medical examination without abnormal findings: Secondary | ICD-10-CM

## 2011-04-18 DIAGNOSIS — M791 Myalgia, unspecified site: Secondary | ICD-10-CM

## 2011-04-18 DIAGNOSIS — IMO0001 Reserved for inherently not codable concepts without codable children: Secondary | ICD-10-CM

## 2011-04-18 DIAGNOSIS — R7989 Other specified abnormal findings of blood chemistry: Secondary | ICD-10-CM

## 2011-04-18 DIAGNOSIS — I10 Essential (primary) hypertension: Secondary | ICD-10-CM

## 2011-04-18 LAB — COMPREHENSIVE METABOLIC PANEL
ALT: 20 U/L (ref 0–53)
AST: 24 U/L (ref 0–37)
Alkaline Phosphatase: 71 U/L (ref 39–117)
BUN: 21 mg/dL (ref 6–23)
CO2: 28 mEq/L (ref 19–32)
Chloride: 105 mEq/L (ref 96–112)
Creatinine, Ser: 1 mg/dL (ref 0.4–1.5)
Glucose, Bld: 102 mg/dL — ABNORMAL HIGH (ref 70–99)
Potassium: 4.6 mEq/L (ref 3.5–5.1)
Total Protein: 7.3 g/dL (ref 6.0–8.3)

## 2011-04-18 LAB — LIPID PANEL: VLDL: 11.4 mg/dL (ref 0.0–40.0)

## 2011-04-18 LAB — CK: Total CK: 67 U/L (ref 7–232)

## 2011-04-18 NOTE — Progress Notes (Signed)
Subjective:    Patient ID: Donald [name], male    DOB: Feb 26, 1933, 77 y.o.   MRN: [id number]  HPI  The patient is here for annual Medicare wellness examination and management of other chronic and acute problems.  He has been doing well except he will have excursions of his blood pressure that are asymptomatic. He has had no major illness, surgery or injury. He is having mild paresthesia of the plantar aspect foot.  He will get sore muscles with heavy work. He has no limitations in his activity. He does have some sore joints. He also is aware of decreased stamina. Had nuclear stress test Dec '11  - normal study   The risk factors are reflected in the social history.  The roster of all physicians providing medical care to patient - is listed in the Snapshot section of the chart.  Activities of daily living:  The patient is 100% inedpendent in all ADLs: dressing, toileting, feeding as well as independent mobility  Home safety : The patient has smoke detectors in the home. They wear seatbelts.  firearms are present in the home, kept in a safe fashion. There is no violence in the home.   There is no risks for hepatitis, STDs or HIV. There is no   history of blood transfusion. They have no travel history to infectious disease endemic areas of the world.  The patient has seen their dentist in the last six month. They have seen their eye doctor in the last year. They admit to any hearing difficulty and have not had audiologic testing in the last year.  They do not  have excessive sun exposure. Discussed the need for sun protection: hats, long sleeves and use of sunscreen if there is significant sun exposure.   Diet: the importance of a healthy diet is discussed. They do have a healthy diet.  The patient has a regular exercise program with staying active in his garden and volunteering through [employer] .  The benefits of regular aerobic exercise were discussed.  Depression screen:  there are no signs or vegative symptoms of depression- irritability, change in appetite, anhedonia, sadness/tearfullness.  Cognitive assessment: the patient manages all their financial and personal affairs and is actively engaged. They could relate day,date,year and events.   The following portions of the patient's history were reviewed and updated as appropriate: allergies, current medications, past family history, past medical history,  past surgical history, past social history  and problem list.  Vision, hearing, body mass index were assessed and reviewed.   During the course of the visit the patient was educated and counseled about appropriate screening and preventive services including : fall prevention , diabetes screening, nutrition counseling, colorectal cancer screening, and recommended immunizations.  Past Medical History  Diagnosis Date  . Hypertension   . Bradycardia   . ED (erectile dysfunction)   . Personal history of other malignant neoplasm of skin   . Embolism and thrombosis of abdominal aorta   . Arthropathy, unspecified, site unspecified   . Prostatitis, unspecified   . Personal history of other diseases of digestive system   . Allergic rhinitis due to pollen   . Benign neoplasm of colon    Past Surgical History  Procedure Date  . Inguinal hernia repair     right  . Inguinal hernia repair     left  . Tonsillectomy   . Orif fracture of the elbow    Family History  Problem Relation Age of  Onset  . Cancer Mother     ovarian  . Other Father     renal disease- grief over loss of spouse  . Cancer Brother     prostate- died of hematologic disorder 2nd to chemo  . Diabetes Neg Hx   . Coronary artery disease Neg Hx    History   Social History  . Marital Status: Married    Spouse Name: N/A    Number of Children: N/A  . Years of Education: N/A   Occupational History  . Not on file.   Social History Main Topics  . Smoking status: Former Smoker    Types:  Cigarettes, Cigars  . Smokeless tobacco: Not on file   Comment: Has Occasional Cigar  . Alcohol Use: Not on file  . Drug Use: Not on file  . Sexually Active: Not on file   Other Topics Concern  . Not on file   Social History Narrative  . No narrative on file          Review of Systems Constitutional:  Negative for fever, chills, activity change and unexpected weight change.  HEENT:  Negative for hearing loss, ear pain, congestion, neck stiffness and postnasal drip. Negative for sore throat or swallowing problems. Negative for dental complaints.   Eyes: Negative for vision loss or change in visual acuity.  Respiratory: Negative for chest tightness and wheezing. Negative for DOE.   Cardiovascular: Negative for chest pain or palpitations.  decreased exercise tolerance Gastrointestinal: No change in bowel habit. No bloating or gas. No reflux or indigestion Genitourinary: Negative for urgency, frequency, flank pain and difficulty urinating.  Musculoskeletal: Positive for myalgias. Negative back pain, arthralgias and gait problem.  Neurological: Negative for dizziness, tremors, weakness and headaches.  Hematological: Negative for adenopathy. Psychiatric/Behavioral: Negative for behavioral problems and dysphoric mood.      Objective:   Physical Exam Vital signs reviewed Gen'l: Well nourished well developed white male in no acute distress  HEENT: Head: Normocephalic and atraumatic. Right Ear: External ear normal. EAC/TM nl. Left Ear: External ear normal.  EAC w/ cerumen impaction. Nose: Nose normal. Mouth/Throat: Oropharynx is clear and moist. Dentition - native up[per, partial lower, in good repair. No buccal or palatal lesions. Posterior pharynx clear. Eyes: Conjunctivae and sclera clear. EOM intact. Pupils are equal, round, and reactive to light. Right eye exhibits no discharge. Left eye exhibits no discharge. Neck: Normal range of motion. Neck supple. No JVD present. No tracheal  deviation present. No thyromegaly present.  Cardiovascular: Normal rate, regular rhythm, no gallop, no friction rub, no murmur heard.      Quiet precordium. 2+ radial and DP pulses . No carotid bruits Pulmonary/Chest: Effort normal. No respiratory distress or increased WOB, no wheezes, no rales. No chest wall deformity or CVAT. Abdominal: Soft. Bowel sounds are normal in all quadrants. He exhibits no distension, no tenderness, no rebound or guarding, No heptosplenomegaly  Genitourinary:  deferred to GU Musculoskeletal: Normal range of motion. He exhibits no edema and no tenderness.       Small and large joints without redness, synovial thickening or deformity. Full range of motion preserved about all small, median and large joints.  Lymphadenopathy:    He has no cervical or supraclavicular adenopathy.  Neurological: He is alert and oriented to person, place, and time. CN II-XII intact. DTRs 2+ and symmetrical biceps, radial and patellar tendons. Cerebellar function normal with no tremor, rigidity, normal gait and station.  Skin: Skin is warm and dry. No  rash noted. No erythema.  Psychiatric: He has a normal mood and affect. His behavior is normal. Thought content normal.            Assessment & Plan:

## 2011-04-19 DIAGNOSIS — Z0000 Encounter for general adult medical examination without abnormal findings: Secondary | ICD-10-CM | POA: Insufficient documentation

## 2011-04-19 NOTE — Assessment & Plan Note (Signed)
Interval history is benign. Physical exam,sans prostate, normal. Limited labs are pending - separate report to follow. He is current with colorectal cancer screening. He is current with his urologist. Immunizations are up to date.  In summary - a very nice man who is medically stable. He is encouraged to continue to exercise and invest in his health. He will return as needed or in 1 year.

## 2011-04-19 NOTE — Assessment & Plan Note (Signed)
BP Readings from Last 3 Encounters:  04/18/11 124/72  03/11/11 122/60  11/01/10 118/80   Good control. Continue present regimen

## 2011-04-24 ENCOUNTER — Encounter: Payer: Self-pay | Admitting: Internal Medicine

## 2011-05-12 ENCOUNTER — Other Ambulatory Visit: Payer: Self-pay | Admitting: *Deleted

## 2011-05-12 DIAGNOSIS — I10 Essential (primary) hypertension: Secondary | ICD-10-CM

## 2011-05-12 MED ORDER — LOSARTAN POTASSIUM 25 MG PO TABS: 25.0000 mg | ORAL_TABLET | Freq: Every day | ORAL | Status: DC

## 2011-05-18 ENCOUNTER — Ambulatory Visit (INDEPENDENT_AMBULATORY_CARE_PROVIDER_SITE_OTHER): Payer: Medicare Other | Admitting: Internal Medicine

## 2011-05-18 ENCOUNTER — Encounter: Payer: Self-pay | Admitting: Internal Medicine

## 2011-05-18 VITALS — BP 130/90 | HR 80 | Temp 97.0°F | Resp 16 | Wt 217.0 lb

## 2011-05-18 DIAGNOSIS — J069 Acute upper respiratory infection, unspecified: Secondary | ICD-10-CM | POA: Insufficient documentation

## 2011-05-18 MED ORDER — PROMETHAZINE-CODEINE 6.25-10 MG/5ML PO SYRP: 5.0000 mL | ORAL_SOLUTION | ORAL | Status: AC | PRN

## 2011-05-18 MED ORDER — AZITHROMYCIN 250 MG PO TABS: ORAL_TABLET | ORAL | Status: AC

## 2011-05-18 NOTE — Patient Instructions (Addendum)
Use over-the-counter  "cold" medicines  such as"Afrin" nasal spray for nasal congestion as directed instead. Use" Delsym" or" Robitussin" cough syrup varietis for cough.  You can use plain "Tylenol" or "Advil' for fever, chills and achyness.   

## 2011-05-18 NOTE — Progress Notes (Signed)
  Subjective:    Patient ID: Donald [name], male    DOB: 01/24/1933, 78 y.o.   MRN: [id number]  HPI   HPI  C/o URI sx's x  7 days. C/o ST, cough, weakness. Not better with OTC medicines. Actually, the patient is getting worse. The patient did not sleep last night due to cough - green d/c.  Review of Systems  Constitutional: Positive for fever, chills and fatigue.  HENT: Positive for congestion, rhinorrhea, sneezing and postnasal drip.   Eyes: Positive for photophobia and pain. Negative for discharge and visual disturbance.  Respiratory: Positive for cough and wheezing.   Positive for chest pain.  Gastrointestinal: Negative for vomiting, abdominal pain, diarrhea and abdominal distention.  Genitourinary: Negative for dysuria and difficulty urinating.  Skin: Negative for rash.  Neurological: Positive for dizziness, weakness and light-headedness.      Review of Systems     Objective:   Physical Exam  Constitutional: He is oriented to person, place, and time. He appears well-developed.  HENT:  Mouth/Throat: Oropharynx is clear and moist.       eryth throat  Eyes: Conjunctivae are normal. Pupils are equal, round, and reactive to light.  Neck: Normal range of motion. No JVD present. No thyromegaly present.  Cardiovascular: Normal rate, regular rhythm, normal heart sounds and intact distal pulses.  Exam reveals no gallop and no friction rub.   No murmur heard. Pulmonary/Chest: Effort normal and breath sounds normal. No respiratory distress. He has no wheezes. He has no rales. He exhibits no tenderness.  Abdominal: Soft. Bowel sounds are normal. He exhibits no distension and no mass. There is no tenderness. There is no rebound and no guarding.  Musculoskeletal: Normal range of motion. He exhibits no edema and no tenderness.  Lymphadenopathy:    He has no cervical adenopathy.  Neurological: He is alert and oriented to person, place, and time. He has normal reflexes. No cranial nerve  deficit. He exhibits normal muscle tone. Coordination normal.  Skin: Skin is warm and dry. No rash noted.  Psychiatric: He has a normal mood and affect. His behavior is normal. Judgment and thought content normal.          Assessment & Plan:

## 2011-05-19 ENCOUNTER — Encounter: Payer: Self-pay | Admitting: Internal Medicine

## 2011-05-19 NOTE — Assessment & Plan Note (Signed)
See Meds 

## 2011-05-20 ENCOUNTER — Other Ambulatory Visit: Payer: Self-pay | Admitting: *Deleted

## 2011-05-20 MED ORDER — ATENOLOL 25 MG PO TABS: 25.0000 mg | ORAL_TABLET | Freq: Every day | ORAL | Status: DC

## 2011-05-30 ENCOUNTER — Ambulatory Visit (INDEPENDENT_AMBULATORY_CARE_PROVIDER_SITE_OTHER): Payer: Medicare Other | Admitting: Endocrinology

## 2011-05-30 ENCOUNTER — Encounter: Payer: Self-pay | Admitting: Endocrinology

## 2011-05-30 VITALS — BP 142/76 | HR 62 | Temp 97.0°F | Ht 69.0 in | Wt 217.2 lb

## 2011-05-30 DIAGNOSIS — J069 Acute upper respiratory infection, unspecified: Secondary | ICD-10-CM

## 2011-05-30 MED ORDER — CEFUROXIME AXETIL 250 MG PO TABS: 250.0000 mg | ORAL_TABLET | Freq: Two times a day (BID) | ORAL | Status: AC

## 2011-05-30 NOTE — Progress Notes (Signed)
Subjective:    Patient ID: Donald [name], male    DOB: [birth year], 78 y.o.   MRN: [id number]  HPI Pt states few mos of prod-quality cough in the chest.  Several times, he thought he was getting better, only to get worse again.  He has assoc sore throat.   Past Medical History  Diagnosis Date  . Hypertension   . Bradycardia   . ED (erectile dysfunction)   . Personal history of other malignant neoplasm of skin   . Embolism and thrombosis of abdominal aorta   . Arthropathy, unspecified, site unspecified   . Prostatitis, unspecified   . Personal history of other diseases of digestive system   . Allergic rhinitis due to pollen   . Benign neoplasm of colon     Past Surgical History  Procedure Date  . Inguinal hernia repair     right  . Inguinal hernia repair     left  . Tonsillectomy   . Orif fracture of the elbow     History   Social History  . Marital Status: Married    Spouse Name: N/A    Number of Children: 3  . Years of Education: 16   Occupational History  . Comptroller    Social History Main Topics  . Smoking status: Former Smoker    Types: Cigarettes, Cigars  . Smokeless tobacco: Not on file   Comment: Has Occasional Cigar  . Alcohol Use: Not on file  . Drug Use: Not on file  . Sexually Active: Not on file   Other Topics Concern  . 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 2 sons- '64, '63 , I daughter- '66, 4 grandchildren. work: Psychologist, forensic, retired but still consults. 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    Current Outpatient Prescriptions on File Prior to Visit  Medication Sig Dispense Refill  . acetaminophen (TYLENOL) 500 MG tablet Take 500 mg by mouth every 6 (six) hours as needed.        Marland Kitchen atenolol (TENORMIN) 25 MG tablet Take 1 tablet (25 mg total) by mouth daily.  90 tablet  3  . calcium carbonate  (TUMS - DOSED IN MG ELEMENTAL CALCIUM) 500 MG chewable tablet Chew 1 tablet by mouth daily.        . finasteride (PROSCAR) 5 MG tablet Take 5 mg by mouth daily.        . fish oil-omega-3 fatty acids 1000 MG capsule Take 1 g by mouth daily.        . fluticasone (FLONASE) 50 MCG/ACT nasal spray Place 1 spray into the nose daily.        Marland Kitchen LASIX 20 MG tablet TAKE 1 TABLET DAILY FOR HYPERTENSION.  90 each  1  . loratadine (CLARITIN) 10 MG tablet Take 10 mg by mouth daily.        Marland Kitchen losartan (COZAAR) 25 MG tablet Take 1 tablet (25 mg total) by mouth daily.  30 tablet  5  . Misc Natural Products (GLUCOSAMINE CHOND COMPLEX/MSM PO) Take by mouth 2 (two) times daily.        . Multiple Vitamins-Minerals (MULTIVITAMIN,TX-MINERALS) tablet Take 1 tablet by mouth daily.        . NON FORMULARY Single injection of Lidocaine 10/14/2010.       . vitamin C (ASCORBIC ACID) 500 MG tablet Take 500  mg by mouth daily.          No Known Allergies  Family History  Problem Relation Age of Onset  . Cancer Mother     ovarian  . Other Father     renal disease- grief over loss of spouse  . Cancer Brother     prostate- died of hematologic disorder 2nd to chemo  . Diabetes Neg Hx   . Coronary artery disease Neg Hx     BP 142/76  Pulse 62  Temp(Src) 97 F (36.1 C) (Oral)  Ht 5\' 9"  (1.753 m)  Wt 217 lb 3.2 oz (98.521 kg)  BMI 32.07 kg/m2  SpO2 95%  Review of Systems He has nasal congestion and sore throat.  Denies fever, but he has slight left otalgia    Objective:   Physical Exam VITAL SIGNS:  See vs page GENERAL: no distress head: no deformity eyes: no periorbital swelling, no proptosis external nose and ears are normal.   mouth: no lesion seen Both eac's and tm's are normal NECK: There is no palpable thyroid enlargement.  No thyroid nodule is palpable.  No palpable lymphadenopathy at the anterior neck. LUNGS:  Clear to auscultation      Assessment & Plan:  UTI, recurrent HTN, with situational  component

## 2011-05-30 NOTE — Patient Instructions (Addendum)
i have sent a prescription to your pharmacy, for a different antibiotic. I hope you feel better soon.  If you don't feel better by next week, please call dr Debby Bud.   Please continue the same blood-pressure medications.

## 2011-05-31 NOTE — Progress Notes (Signed)
  Subjective:    Patient ID: Donald [name], male    DOB: 19-Jan-1933, 78 y.o.   MRN: [id number]  HPI    Review of Systems     Objective:   Physical Exam        Assessment & Plan:  URI, recurrent

## 2011-06-21 ENCOUNTER — Other Ambulatory Visit: Payer: Self-pay | Admitting: Internal Medicine

## 2011-07-28 ENCOUNTER — Other Ambulatory Visit: Payer: Self-pay | Admitting: Internal Medicine

## 2011-09-12 ENCOUNTER — Other Ambulatory Visit: Payer: Self-pay | Admitting: Internal Medicine

## 2011-09-19 ENCOUNTER — Encounter: Payer: Self-pay | Admitting: Internal Medicine

## 2011-09-19 ENCOUNTER — Ambulatory Visit (INDEPENDENT_AMBULATORY_CARE_PROVIDER_SITE_OTHER): Payer: Medicare Other | Admitting: Internal Medicine

## 2011-09-19 VITALS — BP 120/70 | HR 60 | Temp 98.1°F | Resp 14

## 2011-09-19 DIAGNOSIS — R195 Other fecal abnormalities: Secondary | ICD-10-CM

## 2011-09-19 NOTE — Progress Notes (Signed)
Subjective:    Patient ID: Donald [name], male    DOB: 05-19-1932, 78 y.o.   MRN: [id number]  HPI Donald [name] reports a 3 week h/o loose stools, 5-6 times a day. He will have trouble with stooling with urination. He thought it might be bad food. No blood, question of mucus. He has had 3 rounds of  antibiotics in the last 7 months: Z-pak in October '12, Z-pak in Jan '13, Ceftin in Jan '13. No out of country travel, no sick contacts. He has had a good appetite, no weight loss. He denies abdominal pain, no fever. He has been taking popto-bismal and stools have darkened.  Past Medical History  Diagnosis Date  . Hypertension   . Bradycardia   . ED (erectile dysfunction)   . Personal history of other malignant neoplasm of skin   . Embolism and thrombosis of abdominal aorta   . Arthropathy, unspecified, site unspecified   . Prostatitis, unspecified   . Personal history of other diseases of digestive system   . Allergic rhinitis due to pollen   . Benign neoplasm of colon    Past Surgical History  Procedure Date  . Inguinal hernia repair     right  . Inguinal hernia repair     left  . Tonsillectomy   . Orif fracture of the elbow    Family History  Problem Relation Age of Onset  . Cancer Mother     ovarian  . Other Father     renal disease- grief over loss of spouse  . Cancer Brother     prostate- died of hematologic disorder 2nd to chemo  . Diabetes Neg Hx   . Coronary artery disease Neg Hx    History   Social History  . Marital Status: Married    Spouse Name: N/A    Number of Children: 3  . Years of Education: 16   Occupational History  . Comptroller    Social History Main Topics  . Smoking status: Former Smoker    Types: Cigarettes, Cigars  . Smokeless tobacco: Not on file   Comment: Has Occasional Cigar  . Alcohol Use: Not on file  . Drug Use: Not on file  . Sexually Active: Not on file   Other Topics Concern  . 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 2 sons- '64, '63 , I daughter- '66, 4 grandchildren. work: Psychologist, forensic, retired but still consults. 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       Review of Systems System review is negative for any constitutional, cardiac, pulmonary, GI or neuro symptoms or complaints other than as described in the HPI.     Objective:   Physical Exam Filed Vitals:   09/19/11 1507  BP: 120/70  Pulse: 60  Temp: 98.1 F (36.7 C)  Resp: 14   Gen'l - WNWD white man in no distress HEENT- C&S clear Cor- 2+ radial pulse, RRR Pulm- CTAP Abd- BS+ , no guarding or rebound, no tenderness, no masses.          Assessment & Plan:  Loose stools - he has had antibiotic exposure and c. Diff is of concern although this would be an atypical presentation.  Plan - stool for C.Diff           Bulk laxative for control of stooling.  Stop Pepto-bismal

## 2011-09-19 NOTE — Patient Instructions (Signed)
Loose stools - no immediately dangerous infection based on history and exam. There is a concern for C. Diff since you have had several courses of antibiotics.  Plan: Will check stool for C. Diff  For loose stools start taking a bulk laxative, e.g. Metamucil twice a day.  Stop Pepto  If stool is positive for C. Diff will treat accordingly.    Diarrhea Infections caused by germs (bacterial) or a virus commonly cause diarrhea. Your caregiver has determined that with time, rest and fluids, the diarrhea should improve. In general, eat normally while drinking more water than usual. Although water may prevent dehydration, it does not contain salt and minerals (electrolytes). Broths, weak tea without caffeine and oral rehydration solutions (ORS) replace fluids and electrolytes. Small amounts of fluids should be taken frequently. Large amounts at one time may not be tolerated. Plain water may be harmful in infants and the elderly. Oral rehydrating solutions (ORS) are available at pharmacies and grocery stores. ORS replace water and important electrolytes in proper proportions. Sports drinks are not as effective as ORS and may be harmful due to sugars worsening diarrhea.  ORS is especially recommended for use in children with diarrhea. As a general guideline for children, replace any new fluid losses from diarrhea and/or vomiting with ORS as follows:   If your child weighs 22 pounds or under (10 kg or less), give 60-120 mL ( -  cup or 2 - 4 ounces) of ORS for each episode of diarrheal stool or vomiting episode.   If your child weighs more than 22 pounds (more than 10 kgs), give 120-240 mL ( - 1 cup or 4 - 8 ounces) of ORS for each diarrheal stool or episode of vomiting.   While correcting for dehydration, children should eat normally. However, foods high in sugar should be avoided because this may worsen diarrhea. Large amounts of carbonated soft drinks, juice, gelatin desserts and other highly sugared  drinks should be avoided.   After correction of dehydration, other liquids that are appealing to the child may be added. Children should drink small amounts of fluids frequently and fluids should be increased as tolerated. Children should drink enough fluids to keep urine clear or pale yellow.   Adults should eat normally while drinking more fluids than usual. Drink small amounts of fluids frequently and increase as tolerated. Drink enough fluids to keep urine clear or pale yellow. Broths, weak decaffeinated tea, lemon lime soft drinks (allowed to go flat) and ORS replace fluids and electrolytes.   Avoid:   Carbonated drinks.   Juice.   Extremely hot or cold fluids.   Caffeine drinks.   Fatty, greasy foods.   Alcohol.   Tobacco.   Too much intake of anything at one time.   Gelatin desserts.   Probiotics are active cultures of beneficial bacteria. They may lessen the amount and number of diarrheal stools in adults. Probiotics can be found in yogurt with active cultures and in supplements.   Wash hands well to avoid spreading bacteria and virus.   Anti-diarrheal medications are not recommended for infants and children.   Only take over-the-counter or prescription medicines for pain, discomfort or fever as directed by your caregiver. Do not give aspirin to children because it may cause Reye's Syndrome.   For adults, ask your caregiver if you should continue all prescribed and over-the-counter medicines.   If your caregiver has given you a follow-up appointment, it is very important to keep that appointment. Not keeping the  appointment could result in a chronic or permanent injury, and disability. If there is any problem keeping the appointment, you must call back to this facility for assistance.  SEEK IMMEDIATE MEDICAL CARE IF:    You or your child is unable to keep fluids down or other symptoms or problems become worse in spite of treatment.   Vomiting or diarrhea develops and  becomes persistent.   There is vomiting of blood or bile (green material).   There is blood in the stool or the stools are black and tarry.   There is no urine output in 6-8 hours or there is only a small amount of very dark urine.   Abdominal pain develops, increases or localizes.   You have a fever.   Your baby is older than 3 months with a rectal temperature of 102 F (38.9 C) or higher.   Your baby is 56 months old or younger with a rectal temperature of 100.4 F (38 C) or higher.   You or your child develops excessive weakness, dizziness, fainting or extreme thirst.   You or your child develops a rash, stiff neck, severe headache or become irritable or sleepy and difficult to awaken.  MAKE SURE YOU:    Understand these instructions.   Will watch your condition.   Will get help right away if you are not doing well or get worse.  Document Released: 04/22/2002 Document Revised: 04/21/2011 Document Reviewed: 03/09/2009 [facility] Patient Information 2012 [place], [place].

## 2011-09-20 ENCOUNTER — Other Ambulatory Visit: Payer: Medicare Other

## 2011-09-20 DIAGNOSIS — R195 Other fecal abnormalities: Secondary | ICD-10-CM

## 2011-10-06 ENCOUNTER — Telehealth: Payer: Self-pay | Admitting: *Deleted

## 2011-10-06 NOTE — Telephone Encounter (Signed)
Message copied by Elnora Morrison on Thu Oct 06, 2011  1:54 PM ------      Message from: Illene Regulus E      Created: Mon Sep 26, 2011  5:04 AM       Please call patient - stool negative for C.Diff

## 2011-10-06 NOTE — Telephone Encounter (Signed)
Spoke with patient wife. States he is doing better but would have him return my call .

## 2011-10-06 NOTE — Telephone Encounter (Signed)
Message copied by Elnora Morrison on Thu Oct 06, 2011  2:00 PM ------      Message from: Illene Regulus E      Created: Mon Sep 26, 2011  5:04 AM       Please call patient - stool negative for C.Diff

## 2012-02-03 ENCOUNTER — Other Ambulatory Visit: Payer: Self-pay | Admitting: Internal Medicine

## 2012-02-07 ENCOUNTER — Ambulatory Visit (INDEPENDENT_AMBULATORY_CARE_PROVIDER_SITE_OTHER): Payer: Medicare Other | Admitting: General Practice

## 2012-02-07 DIAGNOSIS — Z23 Encounter for immunization: Secondary | ICD-10-CM

## 2012-04-23 ENCOUNTER — Encounter: Payer: Medicare Other | Admitting: Internal Medicine

## 2012-05-01 ENCOUNTER — Other Ambulatory Visit (INDEPENDENT_AMBULATORY_CARE_PROVIDER_SITE_OTHER): Payer: Medicare Other

## 2012-05-01 ENCOUNTER — Ambulatory Visit (INDEPENDENT_AMBULATORY_CARE_PROVIDER_SITE_OTHER): Payer: Medicare Other | Admitting: Internal Medicine

## 2012-05-01 ENCOUNTER — Encounter: Payer: Self-pay | Admitting: Internal Medicine

## 2012-05-01 ENCOUNTER — Other Ambulatory Visit: Payer: Self-pay | Admitting: *Deleted

## 2012-05-01 VITALS — BP 116/70 | HR 59 | Temp 97.3°F | Resp 12 | Ht 68.5 in | Wt 199.0 lb

## 2012-05-01 DIAGNOSIS — I10 Essential (primary) hypertension: Secondary | ICD-10-CM

## 2012-05-01 DIAGNOSIS — R195 Other fecal abnormalities: Secondary | ICD-10-CM

## 2012-05-01 DIAGNOSIS — Z0000 Encounter for general adult medical examination without abnormal findings: Secondary | ICD-10-CM

## 2012-05-01 LAB — COMPREHENSIVE METABOLIC PANEL
AST: 18 U/L (ref 0–37)
Alkaline Phosphatase: 72 U/L (ref 39–117)
Glucose, Bld: 102 mg/dL — ABNORMAL HIGH (ref 70–99)
Sodium: 139 mEq/L (ref 135–145)
Total Bilirubin: 1 mg/dL (ref 0.3–1.2)
Total Protein: 7.1 g/dL (ref 6.0–8.3)

## 2012-05-01 MED ORDER — FUROSEMIDE 20 MG PO TABS: 20.0000 mg | ORAL_TABLET | Freq: Every day | ORAL | Status: DC

## 2012-05-01 MED ORDER — FLUTICASONE PROPIONATE 50 MCG/ACT NA SUSP: 1.0000 | Freq: Every day | NASAL | Status: DC

## 2012-05-01 MED ORDER — LOSARTAN POTASSIUM 25 MG PO TABS: 25.0000 mg | ORAL_TABLET | Freq: Every day | ORAL | Status: DC

## 2012-05-01 NOTE — Progress Notes (Signed)
Subjective:    Patient ID: Donald [name], male    DOB: 1932-12-22, 78 y.o.   MRN: [id number]  HPI The patient is here for annual Medicare wellness examination and management of other chronic and acute problems.  He continues to have chronic loose stools and periodic fecal incontinence.   The risk factors are reflected in the social history.  The roster of all physicians providing medical care to patient - is listed in the Snapshot section of the chart.  Activities of daily living:  The patient is 100% inedpendent in all ADLs: dressing, toileting, feeding as well as independent mobility  Home safety : The patient has smoke detectors in the home. [place] - none. Home is fall safe. They wear seatbelts. No firearms at home  There is no risks for hepatitis, STDs or HIV. There is no history of blood transfusion. They have no travel history to infectious disease endemic areas of the world.  The patient has seen their dentist in the last six month. They have seen their eye doctor in the last year. They deny any hearing difficulty and have not had audiologic testing in the last year.    They do not  have excessive sun exposure. Discussed the need for sun protection: hats, long sleeves and use of sunscreen if there is significant sun exposure.   Diet: the importance of a healthy diet is discussed. They do have a healthy diet.  The patient has a regular exercise program: cardio / machines , 60 min duration, 4 per week.  The benefits of regular aerobic exercise were discussed.  Depression screen: there are no signs or vegative symptoms of depression- irritability, change in appetite, anhedonia, sadness/tearfullness.  Cognitive assessment: the patient manages all their financial and personal affairs and is actively engaged.   Past Medical History  Diagnosis Date  . Hypertension   . Bradycardia   . ED (erectile dysfunction)   . Personal history of other malignant neoplasm of skin   . Embolism  and thrombosis of abdominal aorta   . Arthropathy, unspecified, site unspecified   . Prostatitis, unspecified   . Personal history of other diseases of digestive system   . Allergic rhinitis due to pollen   . Benign neoplasm of colon    Past Surgical History  Procedure Date  . Inguinal hernia repair     right  . Inguinal hernia repair     left  . Tonsillectomy   . Orif fracture of the elbow    Family History  Problem Relation Age of Onset  . Cancer Mother     ovarian  . Other Father     renal disease- grief over loss of spouse  . Cancer Brother     prostate- died of hematologic disorder 2nd to chemo  . Diabetes Neg Hx   . Coronary artery disease Neg Hx    History   Social History  . Marital Status: Married    Spouse Name: N/A    Number of Children: 3  . Years of Education: 16   Occupational History  . Comptroller    Social History Main Topics  . Smoking status: Former Smoker    Types: Cigarettes, Cigars    Quit date: 09/18/2008  . Smokeless tobacco: Never Used     Comment: Has Occasional Cigar  . Alcohol Use: 10.5 oz/week    21 drink(s) per week  . Drug Use: No  . Sexually Active: Not on file   Other Topics Concern  .  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 2 sons- '64, '63 , I daughter- '66, 4 grandchildren. work: Psychologist, forensic, retired but still consults. 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    Current Outpatient Prescriptions on File Prior to Visit  Medication Sig Dispense Refill  . acetaminophen (TYLENOL) 500 MG tablet Take 500 mg by mouth every 6 (six) hours as needed.        Marland Kitchen atenolol (TENORMIN) 25 MG tablet Take 1 tablet (25 mg total) by mouth daily.  90 tablet  3  . calcium carbonate (TUMS - DOSED IN MG ELEMENTAL CALCIUM) 500 MG chewable tablet Chew 1 tablet by mouth daily.        Marland Kitchen COZAAR 25 MG  tablet TAKE 1 TABLET ONCE DAILY.  90 each  3  . finasteride (PROSCAR) 5 MG tablet Take 5 mg by mouth daily.        . fish oil-omega-3 fatty acids 1000 MG capsule Take 1 g by mouth daily.        Marland Kitchen FLONASE 50 MCG/ACT nasal spray USE 1 SPRAY EACH NOSTRIL ONCE DAILY.     16 g  11  . LASIX 20 MG tablet TAKE 1 TABLET DAILY FOR HYPERTENSION.  90 tablet  2  . loratadine (CLARITIN) 10 MG tablet Take 10 mg by mouth daily.        . Misc Natural Products (GLUCOSAMINE CHOND COMPLEX/MSM PO) Take by mouth 2 (two) times daily.        . Multiple Vitamins-Minerals (MULTIVITAMIN,TX-MINERALS) tablet Take 1 tablet by mouth daily.        . NON FORMULARY Single injection of Lidocaine 10/14/2010.       . vitamin C (ASCORBIC ACID) 500 MG tablet Take 500 mg by mouth daily.           Vision, hearing, body mass index were assessed and reviewed.   During the course of the visit the patient was educated and counseled about appropriate screening and preventive services including : fall prevention , diabetes screening, nutrition counseling, colorectal cancer screening, and recommended immunizations.    Review of Systems Constitutional:  Negative for fever, chills, activity change and unexpected weight change.  HEENT:  Negative for hearing loss, ear pain, congestion, neck stiffness and postnasal drip. Negative for sore throat or swallowing problems. Negative for dental complaints.   Eyes: Negative for vision loss or change in visual acuity.  Respiratory: Negative for chest tightness and wheezing. Negative for DOE.   Cardiovascular: Negative for chest pain or palpitations. No decreased exercise tolerance Gastrointestinal: No change in bowel habit. No bloating or gas. No reflux or indigestion Genitourinary: Negative for urgency, frequency, flank pain and difficulty urinating.  Musculoskeletal: Negative for myalgias, back pain, arthralgias and gait problem.  Neurological: Negative for dizziness, tremors, weakness and  headaches.  Hematological: Negative for adenopathy.  Psychiatric/Behavioral: Negative for behavioral problems and dysphoric mood.       Objective:   Physical Exam Filed Vitals:   05/01/12 0956  BP: 116/70  Pulse: 59  Temp: 97.3 F (36.3 C)  Resp: 12   Wt Readings from Last 3 Encounters:  05/01/12 199 lb 0.6 oz (90.284 kg)  05/30/11 217 lb 3.2 oz (98.521 kg)  05/18/11 217 lb (98.431 kg)   Gen'l: Well nourished well developed white male in no acute distress  HEENT: Head: Normocephalic and atraumatic.  Right Ear: External ear normal. EAC/TM nl. Left Ear: External ear normal.  EAC/TM nl. Nose: Nose normal. Mouth/Throat: Oropharynx is clear and moist. Dentition - native, in good repair. No buccal or palatal lesions. Posterior pharynx clear. Eyes: Conjunctivae and sclera clear. EOM intact. Pupils are equal, round, and reactive to light. Right eye exhibits no discharge. Left eye exhibits no discharge. Neck: Normal range of motion. Neck supple. No JVD present. No tracheal deviation present. No thyromegaly present.  Cardiovascular: Normal rate, regular rhythm, no gallop, no friction rub, no murmur heard.      Quiet precordium. 2+ radial and DP pulses . No carotid bruits Pulmonary/Chest: Effort normal. No respiratory distress or increased WOB, no wheezes, no rales. No chest wall deformity or CVAT. Abdomen: Soft. Bowel sounds are normal in all quadrants. He exhibits no distension, no tenderness, no rebound or guarding, No heptosplenomegaly  Genitourinary:  deferred Musculoskeletal: Normal range of motion. He exhibits no edema and no tenderness.       Small and large joints without redness, synovial thickening or deformity. Full range of motion preserved about all small, median and large joints.  Lymphadenopathy:    He has no cervical or supraclavicular adenopathy.  Neurological: He is alert and oriented to person, place, and time. CN II-XII intact. DTRs 2+ and symmetrical biceps, radial and  patellar tendons. Cerebellar function normal with no tremor, rigidity, normal gait and station.  Skin: Skin is warm and dry. No rash noted. No erythema.  Psychiatric: He has a normal mood and affect. His behavior is normal. Thought content normal.   Lab Results  Component Value Date   WBC 8.1 04/13/2009   HGB 15.3 07/16/2010   HCT 45.0 07/16/2010   PLT 166.0 04/13/2009   GLUCOSE 102 05/01/2012   CHOL 209 04/18/2011   TRIG 57.0 04/18/2011   HDL 60.90 04/18/2011   LDLDIRECT 138.8 04/18/2011   LDLCALC 98 04/11/2008   ALT 17 05/01/2012   AST 18 05/01/2012   NA 139 05/01/2012   K 4.6 05/01/2012   CL 105 05/01/2012   CREATININE 1.0 05/01/2012   BUN 24* 05/01/2012   CO2 25 05/01/2012   PSA 5.23 04/11/2008           Assessment & Plan:

## 2012-05-01 NOTE — Patient Instructions (Addendum)
Thanks for coming to see me.  You seem to be in good health. IN regard to the loose stools will have you see Dr. Jarold Motto.  Have a [address]!!

## 2012-05-02 NOTE — Assessment & Plan Note (Signed)
Interval medical history without any major illness, surgery or injury. Physical exam is normal. Lab results reviewed - normal values with minimal, insignificant elevation in LDL cholesterol - 8 points above goal of 130 or less. He is current with colorectal cancer screening, has aged out of prostate screening and his immunizations are up to date.  In summary - a delightful man who is medically stable and doing well. He will continue his active lifestyle and return in 1 year or sooner if needed.

## 2012-05-02 NOTE — Assessment & Plan Note (Signed)
BP Readings from Last 3 Encounters:  05/01/12 116/70  09/19/11 120/70  05/30/11 142/76   Very good control on present medications. Renal function is normal  Plan  Continue present medication

## 2012-05-03 ENCOUNTER — Encounter: Payer: Self-pay | Admitting: *Deleted

## 2012-05-04 ENCOUNTER — Encounter: Payer: Self-pay | Admitting: Gastroenterology

## 2012-05-04 ENCOUNTER — Other Ambulatory Visit (INDEPENDENT_AMBULATORY_CARE_PROVIDER_SITE_OTHER): Payer: Medicare Other

## 2012-05-04 ENCOUNTER — Ambulatory Visit (INDEPENDENT_AMBULATORY_CARE_PROVIDER_SITE_OTHER): Payer: Medicare Other | Admitting: Gastroenterology

## 2012-05-04 VITALS — BP 132/60 | HR 62 | Ht 68.5 in | Wt 198.0 lb

## 2012-05-04 DIAGNOSIS — R195 Other fecal abnormalities: Secondary | ICD-10-CM

## 2012-05-04 DIAGNOSIS — K5730 Diverticulosis of large intestine without perforation or abscess without bleeding: Secondary | ICD-10-CM

## 2012-05-04 DIAGNOSIS — R197 Diarrhea, unspecified: Secondary | ICD-10-CM

## 2012-05-04 MED ORDER — COLESEVELAM HCL 625 MG PO TABS: ORAL_TABLET | ORAL | Status: DC

## 2012-05-04 NOTE — Progress Notes (Signed)
History of Present Illness:  This is a 78 year old Caucasian male patient of Dr. Illene Regulus for a day for evaluation of 8 months of loose soft stools several times a day without melena, hematochezia, abdominal pain, or other gastrointestinal symptoms.  He does have some urgency of his stool, and sometimes can not differentiate between stool and gas.  He is been trying to lose weight, but denies a specific food intolerances, also denies use of sorbitol, or fructose or other nonabsorbable carbohydrates.  I have seen him in the past she had a negative colonoscopy in January of 2011 except for severe diverticulosis in the lower rectosigmoid area.  Dr. Debby Bud as recently recommended increased fiber in his diet which has helped his bowel pattern considerably.  He denies upper GI or hepatobiliary complaints or any family history of gastrointestinal problems.  There is no past history of pancreatitis or hepatitis.  Several stool exams for C. difficile have been negative.  Also, the patient denies recent antibiotic use.  He is followed by urology for prostatitis.  I have reviewed this patient's present history, medical and surgical past history, allergies and medications.     ROS: The remainder of the 10 point ROS is negative... no documented history of diverticulitis previous abdominal surgery except for inguinal hernia repair.  He does suffer from skin cancers which are treated by his dermatologist.     Physical Exam: Blood pressure 132/60, pulse 62 and regular, and weight 198 pounds with a BMI of 29.67. General well developed well nourished patient in no acute distress, appearing their stated age Eyes PERRLA, no icterus, fundoscopic exam per opthamologist Skin no lesions noted Neck supple, no adenopathy, no thyroid enlargement, no tenderness Chest clear to percussion and auscultation Heart no significant murmurs, gallops or rubs noted Abdomen no hepatosplenomegaly masses or tenderness, BS normal.   Rectal inspection normal no fissures, or fistulae noted.  No masses or tenderness on digital exam. Stool guaiac negative.  Perianal skin tags noted. Extremities no acute joint lesions, edema, phlebitis or evidence of cellulitis. Neurologic patient oriented x 3, cranial nerves intact, no focal neurologic deficits noted. Psychological mental status normal and normal affect.  Assessment and plan: Probable incomplete rectal emptying associated with hir severe diverticulosis.  I have asked him to continue increased fiber in his diet and to avoid nonabsorbable carbohydrates.  He is to bring in stool specimens for O&P, fat staining, and repeat C. difficile stool exam PCR.  We'll try WelChol 625 mg a day to be adjusted as per his stool pattern for a possible element of bile salt enteropathy.  Other considerations would be that he has mild bacterial overgrowth syndrome,and he may need a trial of Xifaxan therapy..  He has tried probiotics without response.  As patient does not appear acutely or chronically ill.  I also check serum carotene, celiac panel, and anemia panel.  Office followup in several weeks' time scheduled.  Encounter Diagnosis  Name Primary?  . Loose stools Yes

## 2012-05-04 NOTE — Patient Instructions (Signed)
We have sent the following medications to your pharmacy for you to pick up at your convenience: Welchol, please take as directed.  Per Dr. Jarold Motto please make a follow up appointment after the holidays.  Your physician has requested that you go to the basement for lab work before leaving today

## 2012-05-07 ENCOUNTER — Other Ambulatory Visit: Payer: Medicare Other

## 2012-05-07 DIAGNOSIS — R195 Other fecal abnormalities: Secondary | ICD-10-CM

## 2012-05-07 LAB — CELIAC PANEL 10
Endomysial Screen: NEGATIVE
Gliadin IgA: 6.4 U/mL (ref <20)
Gliadin IgG: 11.9 U/mL (ref <20)
IgA: 295 mg/dL (ref 68–379)

## 2012-05-17 ENCOUNTER — Other Ambulatory Visit: Payer: Self-pay | Admitting: Internal Medicine

## 2012-05-21 ENCOUNTER — Encounter: Payer: Self-pay | Admitting: Gastroenterology

## 2012-05-21 HISTORY — PX: SKIN CANCER EXCISION: SHX779

## 2012-05-22 ENCOUNTER — Telehealth: Payer: Self-pay | Admitting: Gastroenterology

## 2012-05-22 ENCOUNTER — Encounter: Payer: Self-pay | Admitting: Gastroenterology

## 2012-05-22 ENCOUNTER — Ambulatory Visit (INDEPENDENT_AMBULATORY_CARE_PROVIDER_SITE_OTHER): Payer: Medicare Other | Admitting: Gastroenterology

## 2012-05-22 VITALS — BP 150/80 | HR 56 | Ht 68.5 in | Wt 206.5 lb

## 2012-05-22 DIAGNOSIS — K5289 Other specified noninfective gastroenteritis and colitis: Secondary | ICD-10-CM

## 2012-05-22 DIAGNOSIS — R197 Diarrhea, unspecified: Secondary | ICD-10-CM

## 2012-05-22 DIAGNOSIS — K529 Noninfective gastroenteritis and colitis, unspecified: Secondary | ICD-10-CM

## 2012-05-22 DIAGNOSIS — K5730 Diverticulosis of large intestine without perforation or abscess without bleeding: Secondary | ICD-10-CM

## 2012-05-22 DIAGNOSIS — K52839 Microscopic colitis, unspecified: Secondary | ICD-10-CM | POA: Insufficient documentation

## 2012-05-22 NOTE — Telephone Encounter (Signed)
Please clarify: charge for changing Flex Sig day? New medication update from today's visit; stop Welchol and try Imodium QHS? Thanks.

## 2012-05-22 NOTE — Progress Notes (Signed)
This is a very pleasant 79 year old Caucasian male who has had 8 months of diarrhea with frequent urgent stooling and occasional incontinency.  Recent examination included negative evaluation for ova and parasites or pathogens and his stool.  Exam for Giardia also was negative. There was no evidence of excessive fat in his stool.  Review of his chart shows a previous colonoscopy in 2005 by Dr. Eulah Pont in [facility] with a right colon adenoma excised.  Followup colonoscopy 2 years ago in the [facility] showed diverticulosis but otherwise was unremarkable.  Patient has increased fiber in his diet, has had a trial of WelChol 625 mg a day, probiotics, but continues to have frequent stooling with some excessive gas but no abdominal pain.  He denies a specific upper GI or hepatobiliary complaints, previously has some weight loss which has burst.  He denies recent antibiotics, sick family members at home, history of hepatitis or pancreatitis, any history of systemic complaints such as fever, chills, skin rashes, joint pains, oral stomatitis etc.  Recent evaluation for celiac disease also was negative, and CRP was  Normal also.  Current Medications, Allergies, Past Medical History, Past Surgical History, Family History and Social History were reviewed in [employer] record.  Pertinent Review of Systems Negative   Physical Exam: Blood pressure 150/80, pulse 56 and regular, and weight 206 pounds the BMI of 30.94.  I cannot appreciate stigmata of chronic liver disease or thyromegaly.  I cannot appreciate abdominal distention, organomegaly, masses or tenderness.  Bowel sounds are normal.  Rectal exam shows no masses or tenderness with soft stool which is guaiac negative.  L. status is normal.    Assessment and Plan: Continue diarrhea of unexplained etiology, consider microscopic-collagenous colitis, bacterial overgrowth syndrome, versus other unusual causes of chronic diarrhea such  as occult chronic pancreatitis, carcinoid syndrome, Whipple's disease, et Karie Soda.  There is no history of progressive neuropathy or arthropathy.  The patient's only surgery was previous laparoscopic left inguinal hernia repair in 2007.  Schedule flexible sigmoidoscopy with colon biopsies and we'll see accordingly.  He has not tried previous Imodium which may be a good idea to institute this on each bedtime basis. Encounter Diagnosis  Name Primary?  . Microscopic colitis Yes

## 2012-05-22 NOTE — Patient Instructions (Addendum)
You have been scheduled for a flexible sigmoidoscopy. Please follow the written instructions given to you at your visit today. If you use inhalers (even only as needed), please bring them with you on the day of your procedure. CC:  Illene Regulus MD

## 2012-05-22 NOTE — Telephone Encounter (Signed)
Stop welchol and try qhs imodium.Marland KitchenMarland Kitchen

## 2012-05-23 ENCOUNTER — Other Ambulatory Visit: Payer: Medicare Other | Admitting: Gastroenterology

## 2012-05-25 ENCOUNTER — Encounter: Payer: Self-pay | Admitting: Gastroenterology

## 2012-05-25 ENCOUNTER — Ambulatory Visit (AMBULATORY_SURGERY_CENTER): Payer: Medicare Other | Admitting: Gastroenterology

## 2012-05-25 VITALS — BP 151/75 | HR 51 | Temp 96.7°F | Resp 16 | Ht 68.0 in | Wt 206.0 lb

## 2012-05-25 DIAGNOSIS — R195 Other fecal abnormalities: Secondary | ICD-10-CM

## 2012-05-25 DIAGNOSIS — D126 Benign neoplasm of colon, unspecified: Secondary | ICD-10-CM

## 2012-05-25 DIAGNOSIS — K5289 Other specified noninfective gastroenteritis and colitis: Secondary | ICD-10-CM

## 2012-05-25 DIAGNOSIS — K5730 Diverticulosis of large intestine without perforation or abscess without bleeding: Secondary | ICD-10-CM

## 2012-05-25 DIAGNOSIS — Z1211 Encounter for screening for malignant neoplasm of colon: Secondary | ICD-10-CM

## 2012-05-25 DIAGNOSIS — K6389 Other specified diseases of intestine: Secondary | ICD-10-CM

## 2012-05-25 MED ORDER — SODIUM CHLORIDE 0.9 % IV SOLN: 500.0000 mL | INTRAVENOUS | Status: DC

## 2012-05-25 NOTE — Progress Notes (Signed)
Patient did not experience any of the following events: a burn prior to discharge; a fall within the facility; wrong site/side/patient/procedure/implant event; or a hospital transfer or hospital admission upon discharge from the facility. (G8907) Patient did not have preoperative order for IV antibiotic SSI prophylaxis. (G8918)  

## 2012-05-25 NOTE — Patient Instructions (Addendum)

## 2012-05-25 NOTE — Op Note (Signed)
[facility] [address] [place], [address]   FLEXIBLE SIGMOIDOSCOPY PROCEDURE REPORT  PATIENT: Donald [name], Donald [name]  MR#: [id number] BIRTHDATE: [birth year] , 79  yrs. old GENDER: Male ENDOSCOPIST: Mardella Layman, MD, [facility] REFERRED BY: PROCEDURE DATE:  05/25/2012 PROCEDURE:   Sigmoidoscopy with biopsy ASA CLASS:   Class II INDICATIONS:chronic diarrhea.   unexplained diarrhea. MEDICATIONS: Propofol (Diprivan) 130 mg IV  DESCRIPTION OF PROCEDURE:   After the risks benefits and alternatives of the procedure were thoroughly explained, informed consent was obtained.  revealed no abnormalities of the rectum. The [id number]  endoscope was introduced through the anus and advanced to the splenic flexure , limited by No adverse events experienced.   The quality of the prep was adequate .  The instrument was then slowly withdrawn as the mucosa was fully examined.       1.  COLON FINDINGS: Moderate diverticulosis was noted in the descending colon and sigmoid colon. The colon mucosa was otherwise normal. Random biopsies done for path exam. Retroflexed views revealed no abnormalities.    The scope was then withdrawn from the patient and the procedure terminated.  COMPLICATIONS: There were no complications.  ENDOSCOPIC IMPRESSION :Diverticulosis...r/o microscopic/collagenous colitis... 1.   Moderate diverticulosis was noted in the descending colon and sigmoid colon 2.   The colon mucosa was otherwise normal ..Marland KitchenRandom biopsies done...  RECOMMENDATIONS: 1.  await biopsy results 2.  continue current meds   REPEAT EXAM: standard discharge  _______________________________ eSigned:  Mardella Layman, MD, [facility] 05/25/2012 4:19 PM   HY:[id number] Esther Hardy, MD

## 2012-05-28 ENCOUNTER — Telehealth: Payer: Self-pay | Admitting: *Deleted

## 2012-05-28 NOTE — Telephone Encounter (Signed)
  Follow up Call-  Call back number 05/25/2012  Post procedure Call Back phone  # [phone]  Permission to leave phone message Yes     Patient questions:  Do you have a fever, pain , or abdominal swelling? no Pain Score  0 *  Have you tolerated food without any problems? yes  Have you been able to return to your normal activities? yes  Do you have any questions about your discharge instructions: Diet   no Medications  no Follow up visit  no  Do you have questions or concerns about your Care? no  Actions: * If pain score is 4 or above: No action needed, pain <4.

## 2012-05-29 ENCOUNTER — Encounter: Payer: Self-pay | Admitting: Internal Medicine

## 2012-06-01 ENCOUNTER — Encounter: Payer: Self-pay | Admitting: Gastroenterology

## 2012-11-05 ENCOUNTER — Other Ambulatory Visit: Payer: Self-pay | Admitting: Dermatology

## 2012-11-21 ENCOUNTER — Other Ambulatory Visit: Payer: Self-pay | Admitting: Internal Medicine

## 2013-01-31 ENCOUNTER — Other Ambulatory Visit: Payer: Self-pay | Admitting: Internal Medicine

## 2013-02-07 ENCOUNTER — Ambulatory Visit (INDEPENDENT_AMBULATORY_CARE_PROVIDER_SITE_OTHER): Payer: Medicare Other

## 2013-02-07 DIAGNOSIS — Z23 Encounter for immunization: Secondary | ICD-10-CM

## 2013-03-11 ENCOUNTER — Other Ambulatory Visit: Payer: Self-pay

## 2013-03-11 MED ORDER — LOSARTAN POTASSIUM 25 MG PO TABS: 25.0000 mg | ORAL_TABLET | Freq: Every day | ORAL | Status: DC

## 2013-04-09 ENCOUNTER — Encounter: Payer: Self-pay | Admitting: Internal Medicine

## 2013-04-09 ENCOUNTER — Ambulatory Visit (INDEPENDENT_AMBULATORY_CARE_PROVIDER_SITE_OTHER): Payer: Medicare Other | Admitting: Internal Medicine

## 2013-04-09 VITALS — BP 158/92 | HR 73 | Temp 98.4°F | Wt 207.0 lb

## 2013-04-09 DIAGNOSIS — I10 Essential (primary) hypertension: Secondary | ICD-10-CM

## 2013-04-09 MED ORDER — LOSARTAN POTASSIUM 50 MG PO TABS: 50.0000 mg | ORAL_TABLET | Freq: Every day | ORAL | Status: DC

## 2013-04-09 NOTE — Patient Instructions (Signed)
1. Dizziness - this really sounds like an inner ear issue plus the multiple lenses. No need for further testing.  2. Blood pressure - has been elevated the last several visit and by your own readings. Plan Continue your present medications  Increase Losartan to 50 mg (2x25mg ) daily.   Use MyChart to send me any message about refills or lack of control.

## 2013-04-09 NOTE — Progress Notes (Signed)
Pre visit review using our clinic review tool, if applicable. No additional management support is needed unless otherwise documented below in the visit note. 
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
## 2013-04-10 NOTE — Assessment & Plan Note (Signed)
By patient's report suboptimal control with SBP >150 on a regular basis.  Plan Continue present dose of atenolol and furosemide  Increase Losartan to 50 mg daily  Report back BP readings.

## 2013-05-02 ENCOUNTER — Other Ambulatory Visit: Payer: Self-pay | Admitting: Dermatology

## 2013-05-06 ENCOUNTER — Other Ambulatory Visit: Payer: Self-pay | Admitting: Internal Medicine

## 2013-05-06 ENCOUNTER — Other Ambulatory Visit: Payer: Self-pay

## 2013-05-06 MED ORDER — LOSARTAN POTASSIUM 50 MG PO TABS: 50.0000 mg | ORAL_TABLET | Freq: Every day | ORAL | Status: DC

## 2013-05-07 ENCOUNTER — Ambulatory Visit (INDEPENDENT_AMBULATORY_CARE_PROVIDER_SITE_OTHER)
Admission: RE | Admit: 2013-05-07 | Discharge: 2013-05-07 | Disposition: A | Discharge status: Home / Self Care | Payer: Medicare Other | Source: Ambulatory Visit | Attending: Internal Medicine | Admitting: Internal Medicine

## 2013-05-07 ENCOUNTER — Ambulatory Visit (INDEPENDENT_AMBULATORY_CARE_PROVIDER_SITE_OTHER): Payer: Medicare Other | Admitting: Internal Medicine

## 2013-05-07 ENCOUNTER — Ambulatory Visit (HOSPITAL_COMMUNITY)
Admission: RE | Admit: 2013-05-07 | Discharge: 2013-05-07 | Disposition: A | Discharge status: Home / Self Care | Payer: Medicare Other | Source: Ambulatory Visit | Attending: Internal Medicine | Admitting: Internal Medicine

## 2013-05-07 ENCOUNTER — Telehealth: Payer: Self-pay

## 2013-05-07 ENCOUNTER — Other Ambulatory Visit: Payer: Self-pay | Admitting: Internal Medicine

## 2013-05-07 ENCOUNTER — Other Ambulatory Visit (INDEPENDENT_AMBULATORY_CARE_PROVIDER_SITE_OTHER): Payer: Medicare Other

## 2013-05-07 ENCOUNTER — Encounter: Payer: Self-pay | Admitting: Internal Medicine

## 2013-05-07 VITALS — BP 110/84 | HR 51 | Temp 97.0°F | Ht 70.0 in | Wt 211.0 lb

## 2013-05-07 DIAGNOSIS — R9389 Abnormal findings on diagnostic imaging of other specified body structures: Secondary | ICD-10-CM

## 2013-05-07 DIAGNOSIS — D126 Benign neoplasm of colon, unspecified: Secondary | ICD-10-CM

## 2013-05-07 DIAGNOSIS — I498 Other specified cardiac arrhythmias: Secondary | ICD-10-CM

## 2013-05-07 DIAGNOSIS — R062 Wheezing: Secondary | ICD-10-CM

## 2013-05-07 DIAGNOSIS — E785 Hyperlipidemia, unspecified: Secondary | ICD-10-CM

## 2013-05-07 DIAGNOSIS — K7689 Other specified diseases of liver: Secondary | ICD-10-CM | POA: Insufficient documentation

## 2013-05-07 DIAGNOSIS — R918 Other nonspecific abnormal finding of lung field: Secondary | ICD-10-CM

## 2013-05-07 DIAGNOSIS — Z23 Encounter for immunization: Secondary | ICD-10-CM

## 2013-05-07 DIAGNOSIS — I10 Essential (primary) hypertension: Secondary | ICD-10-CM

## 2013-05-07 DIAGNOSIS — N281 Cyst of kidney, acquired: Secondary | ICD-10-CM | POA: Insufficient documentation

## 2013-05-07 DIAGNOSIS — I700 Atherosclerosis of aorta: Secondary | ICD-10-CM | POA: Insufficient documentation

## 2013-05-07 DIAGNOSIS — Z0000 Encounter for general adult medical examination without abnormal findings: Secondary | ICD-10-CM

## 2013-05-07 DIAGNOSIS — I2510 Atherosclerotic heart disease of native coronary artery without angina pectoris: Secondary | ICD-10-CM | POA: Insufficient documentation

## 2013-05-07 DIAGNOSIS — R351 Nocturia: Secondary | ICD-10-CM

## 2013-05-07 LAB — LIPID PANEL
HDL: 52.9 mg/dL (ref >39.00)
LDL Cholesterol: 107 mg/dL — ABNORMAL HIGH (ref 0–99)
Total CHOL/HDL Ratio: 3
Triglycerides: 81 mg/dL (ref 0.0–149.0)
VLDL: 16.2 mg/dL (ref 0.0–40.0)

## 2013-05-07 LAB — HEPATIC FUNCTION PANEL
ALT: 19 U/L (ref 0–53)
Alkaline Phosphatase: 66 U/L (ref 39–117)
Bilirubin, Direct: 0.2 mg/dL (ref 0.0–0.3)
Total Bilirubin: 0.7 mg/dL (ref 0.3–1.2)
Total Protein: 6.8 g/dL (ref 6.0–8.3)

## 2013-05-07 LAB — BASIC METABOLIC PANEL
CO2: 30 mEq/L (ref 19–32)
Calcium: 9.5 mg/dL (ref 8.4–10.5)
Creatinine, Ser: 1 mg/dL (ref 0.4–1.5)
Sodium: 140 mEq/L (ref 135–145)

## 2013-05-07 MED ORDER — IOHEXOL 300 MG/ML  SOLN
100.0000 mL | Freq: Once | INTRAMUSCULAR | Status: AC | PRN
  Administered 2013-05-07: 80 mL via INTRAVENOUS

## 2013-05-07 MED ORDER — LOSARTAN POTASSIUM 100 MG PO TABS: 100.0000 mg | ORAL_TABLET | Freq: Every day | ORAL | Status: DC

## 2013-05-07 NOTE — Progress Notes (Signed)
Pre visit review using our clinic review tool, if applicable. No additional management support is needed unless otherwise documented below in the visit note. 
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
## 2013-05-07 NOTE — Progress Notes (Signed)
Subjective:    Patient ID: Donald [name], male    DOB: [birth year], 79 y.o.   MRN: [id number]  HPI The patient is here for annual Medicare wellness examination and management of other chronic and acute problems.  Interval: last seen November for BP management. At home SBP 140-150's. He is current with Dr. [name] Swaziland for routine care and biopsy.   The risk factors are reflected in the social history.  The roster of all physicians providing medical care to patient - is listed in the Snapshot section of the chart.  Activities of daily living:  The patient is 100% inedpendent in all ADLs: dressing, toileting, feeding as well as independent mobility  Home safety : The patient has smoke detectors in the home. Falls - no falls and home is fall safe. They wear seatbelts. No firearms at home  There is no violence in the home.   There is no risks for hepatitis, STDs or HIV. There is no   history of blood transfusion. They have no travel history to infectious disease endemic areas of the world.  The patient has seen their dentist in the last six month. They have seen their eye doctor in the last year. They deny any hearing difficulty and have not had audiologic testing in the last year.    They do not  have excessive sun exposure. Discussed the need for sun protection: hats, long sleeves and use of sunscreen if there is significant sun exposure.   Diet: the importance of a healthy diet is discussed. They do have a healthy diet.  The patient has a regular exercise program: gym - aerobic/weight machines , 60 min duration, 4 per week.  The benefits of regular aerobic exercise were discussed.  Depression screen: there are no signs or vegative symptoms of depression- irritability, change in appetite, anhedonia, sadness/tearfullness.  Cognitive assessment: the patient manages all their financial and personal affairs and is actively engaged. Compensates for some lapses by keeping written calendar.    The following portions of the patient's history were reviewed and updated as appropriate: allergies, current medications, past family history, past medical history,  past surgical history, past social history  and problem list.  Vision, hearing, body mass index were assessed and reviewed.   During the course of the visit the patient was educated and counseled about appropriate screening and preventive services including : fall prevention , diabetes screening, nutrition counseling, colorectal cancer screening, and recommended immunizations.  Past Medical History  Diagnosis Date  . Hypertension   . Bradycardia   . ED (erectile dysfunction)   . Personal history of other malignant neoplasm of skin   . Embolism and thrombosis of abdominal aorta   . Arthropathy, unspecified, site unspecified   . Prostatitis, unspecified   . Personal history of other diseases of digestive system   . Allergic rhinitis due to pollen   . Benign neoplasm of colon   . Diverticulosis   . Skin cancer     basal and squamous cell   Past Surgical History  Procedure Laterality Date  . Inguinal hernia repair      right  . Inguinal hernia repair      left  . Tonsillectomy    . Orif fracture of the elbow    . Squamous cell carcinoma excision    . Skin cancer excision  05/21/12    Squamous cell ca   Family History  Problem Relation Age of Onset  . Cancer Mother  ovarian  . Other Father     renal disease- grief over loss of spouse  . Cancer Brother     prostate- died of hematologic disorder 2nd to chemo  . Diabetes Neg Hx   . Coronary artery disease Neg Hx    History   Social History  . Marital Status: Married    Spouse Name: N/A    Number of Children: 3  . Years of Education: 16   Occupational History  . Comptroller    Social History Main Topics  . Smoking status: Former Smoker    Types: Cigarettes, Cigars    Quit date: 09/18/2008  . Smokeless tobacco: Never Used     Comment: Has  Occasional Cigar  . Alcohol Use: 10.5 oz/week    21 drink(s) per week  . Drug Use: No  . Sexual Activity: Not on file   Other Topics Concern  . Not on file   Social History Narrative   HSG, Craig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 married 1961. 2 sons- '64, '63 , I daughter- '66, 4 grandchildren. work: Psychologist, forensic, retired but still consults. 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    Current Outpatient Prescriptions on File Prior to Visit  Medication Sig Dispense Refill  . acetaminophen (TYLENOL) 500 MG tablet Take 500 mg by mouth every 6 (six) hours as needed.        Marland Kitchen atenolol (TENORMIN) 25 MG tablet TAKE 1 TABLET ONCE DAILY.  90 tablet  1  . calcium carbonate (TUMS - DOSED IN MG ELEMENTAL CALCIUM) 500 MG chewable tablet Chew 1 tablet by mouth as needed.       . colesevelam (WELCHOL) 625 MG tablet Take one tablet by mouth once daily  30 tablet  0  . finasteride (PROSCAR) 5 MG tablet Take 5 mg by mouth daily.        . fluticasone (FLONASE) 50 MCG/ACT nasal spray Place 1 spray into the nose daily.  16 g  11  . furosemide (LASIX) 20 MG tablet TAKE 1 TABLET ONCE DAILY.  90 tablet  3  . loratadine (CLARITIN) 10 MG tablet Take 10 mg by mouth as needed.       Marland Kitchen losartan (COZAAR) 50 MG tablet Take 1 tablet (50 mg total) by mouth daily.  90 tablet  3  . Multiple Vitamins-Minerals (MULTIVITAMIN,TX-MINERALS) tablet Take 1 tablet by mouth daily. Takes but not on regular basis      . Probiotic Product (PROBIOTIC DAILY PO) Take by mouth daily.      . vitamin C (ASCORBIC ACID) 500 MG tablet Take 500 mg by mouth daily. Takes but not on regular basis       No current facility-administered medications on file prior to visit.     Review of Systems Constitutional:  Negative for fever, chills, activity change and unexpected weight change.  HEENT:  Negative for hearing loss, ear pain, congestion, neck stiffness and  postnasal drip. Negative for sore throat or swallowing problems. Negative for acute dental complaints.   Eyes: Negative for vision loss or change in visual acuity.  Respiratory: Negative for chest tightness, positive for  Wheezing reported by wife. Negative for DOE.   Cardiovascular: Negative for chest pain or palpitations. No decreased exercise tolerance Gastrointestinal: No change in bowel habit. No bloating or gas. No reflux or indigestion Genitourinary: Negative for urgency, frequency, flank pain and difficulty urinating.  Musculoskeletal: Negative for myalgias,  back pain, arthralgias and gait problem.  Neurological: Negative for dizziness, tremors, weakness and headaches.  Hematological: Negative for adenopathy.  Psychiatric/Behavioral: Negative for behavioral problems and dysphoric mood.       Objective:   Physical Exam Filed Vitals:   05/07/13 0907  BP: 110/84  Pulse: 51  Temp: 97 F (36.1 C)   Wt Readings from Last 3 Encounters:  05/07/13 211 lb (95.709 kg)  04/09/13 207 lb (93.895 kg)  05/25/12 206 lb (93.441 kg)   Gen'l: Well nourished well developed     male in no acute distress  HEENT: Head: Normocephalic and atraumatic. Right Ear: External ear normal. EAC/TM nl. Left Ear: External ear normal.  EAC/TM nl. Nose: Nose normal. Mouth/Throat: Oropharynx is clear and moist. Dentition - native, in good repair. No buccal or palatal lesions. Posterior pharynx clear. Eyes: Conjunctivae and sclera clear. EOM intact. Pupils are equal, round, and reactive to light. Right eye exhibits no discharge. Left eye exhibits no discharge. Neck: Normal range of motion. Neck supple. No JVD present. No tracheal deviation present. No thyromegaly present.  Cardiovascular: Normal rate, regular rhythm, no gallop, no friction rub, no murmur heard.      Quiet precordium. 2+ radial and DP pulses . No carotid bruits Pulmonary/Chest: Effort normal. No respiratory distress or increased WOB, no wheezes, no  rales. No chest wall deformity or CVAT. Abdomen: Soft. Bowel sounds are normal in all quadrants. He exhibits no distension, no tenderness, no rebound or guarding, No heptosplenomegaly  Genitourinary:   Musculoskeletal: Normal range of motion. He exhibits no edema and no tenderness.       Small and large joints without redness, synovial thickening or deformity. Full range of motion preserved about all small, median and large joints.  Lymphadenopathy:    He has no cervical or supraclavicular adenopathy.  Neurological: He is alert and oriented to person, place, and time. CN II-XII intact. DTRs 2+ and symmetrical biceps, radial and patellar tendons. Cerebellar function normal with no tremor, rigidity, normal gait and station.  Skin: Skin is warm and dry. No rash noted. No erythema. Multiple treatment sites: top of the ears, left forearm.  Psychiatric: He has a normal mood and affect. His behavior is normal. Thought content normal.   Recent Results (from the past 2160 hour(s))  LIPID PANEL     Status: Abnormal   Collection Time    05/07/13 10:34 AM      Result Value Range   Cholesterol 176  0 - 200 mg/dL   Comment: ATP III Classification       Desirable:  < 200 mg/dL               Borderline High:  200 - 239 mg/dL          High:  > = 161 mg/dL   Triglycerides 09.6  0.0 - 149.0 mg/dL   Comment: Normal:  <045 mg/dLBorderline High:  150 - 199 mg/dL   HDL 40.98  >11.91 mg/dL   VLDL 47.8  0.0 - 29.5 mg/dL   LDL Cholesterol 621 (*) 0 - 99 mg/dL   Total CHOL/HDL Ratio 3     Comment:                Men          Women1/2 Average Risk     3.4          3.3Average Risk          5.0  4.42X Average Risk          9.6          7.13X Average Risk          15.0          11.0                      BASIC METABOLIC PANEL     Status: Abnormal   Collection Time    05/07/13 10:34 AM      Result Value Range   Sodium 140  135 - 145 mEq/L   Potassium 5.2 (*) 3.5 - 5.1 mEq/L   Chloride 105  96 - 112 mEq/L    CO2 30  19 - 32 mEq/L   Glucose, Bld 96  70 - 99 mg/dL   BUN 26 (*) 6 - 23 mg/dL   Creatinine, Ser 1.0  0.4 - 1.5 mg/dL   Calcium 9.5  8.4 - 16.1 mg/dL   GFR 09.60  >45.40 mL/min  HEPATIC FUNCTION PANEL     Status: None   Collection Time    05/07/13 10:34 AM      Result Value Range   Total Bilirubin 0.7  0.3 - 1.2 mg/dL   Bilirubin, Direct 0.2  0.0 - 0.3 mg/dL   Alkaline Phosphatase 66  39 - 117 U/L   AST 18  0 - 37 U/L   ALT 19  0 - 53 U/L   Total Protein 6.8  6.0 - 8.3 g/dL   Albumin 4.2  3.5 - 5.2 g/dL   2 view CXR: COMPARISON: 03/30/2006  FINDINGS:  Cardiac shadow is stable. The lungs are well aerated bilaterally. In  the right upper lobe there is a somewhat nodular density identified.  This was previously noted but as expanded in size. Further  evaluation by means of CT of the chest is recommended.  IMPRESSION:  Right upper lobe nodular density increased from the previous exam.  CT is recommended for further evaluation.        Assessment & Plan:  Abnormal chest x-ray: read as having nodularity RUL with CT follow up recommended.  CTchest: IMPRESSION:  Radiographic abnormality corresponds to subpleural fat along the  right minor fissure. No suspicious pulmonary nodules.  Mild paraseptal emphysematous changes.  No evidence of acute cardiopulmonary disease.

## 2013-05-07 NOTE — Telephone Encounter (Signed)
Patient notified. He is for CT chest tonight

## 2013-05-07 NOTE — Telephone Encounter (Signed)
Phone call from [facility] [phone] stating xray showed right upper lobe nodule or density, increased from last study. Recommend CT for further eval.

## 2013-05-08 ENCOUNTER — Encounter: Payer: Self-pay | Admitting: Internal Medicine

## 2013-05-08 NOTE — Assessment & Plan Note (Signed)
Interval history is benign. Physical exam is normal. Labs reviewed - in normal range with well controlled lipids. CXR with "nodularity" RUL with f/u CT chest with contrast being normal. He is current with colorectal cancer and prostate screening. Immunizations are up to date.  In summary - A nice man who is medically stable and doing well. ACP is documented in the social history.

## 2013-05-08 NOTE — Assessment & Plan Note (Signed)
Controlled and not an issue for him at this time.

## 2013-05-08 NOTE — Assessment & Plan Note (Signed)
Mr. Samson is current with screening with flex sig in Jan '14 - biopsy negative for microscopic colitis.

## 2013-05-08 NOTE — Assessment & Plan Note (Signed)
Stable heart rate. No reports of symptoms - no syncope or near syncope. No limitations in activity

## 2013-05-13 ENCOUNTER — Ambulatory Visit (INDEPENDENT_AMBULATORY_CARE_PROVIDER_SITE_OTHER): Payer: Medicare Other | Admitting: Internal Medicine

## 2013-05-13 ENCOUNTER — Encounter: Payer: Self-pay | Admitting: Internal Medicine

## 2013-05-13 VITALS — BP 162/90 | HR 80 | Temp 100.6°F | Resp 16 | Wt 209.0 lb

## 2013-05-13 DIAGNOSIS — I10 Essential (primary) hypertension: Secondary | ICD-10-CM

## 2013-05-13 DIAGNOSIS — J069 Acute upper respiratory infection, unspecified: Secondary | ICD-10-CM

## 2013-05-13 MED ORDER — AZITHROMYCIN 250 MG PO TABS: ORAL_TABLET | ORAL | Status: DC

## 2013-05-13 MED ORDER — HYDROCODONE-HOMATROPINE 5-1.5 MG/5ML PO SYRP: 5.0000 mL | ORAL_SOLUTION | Freq: Four times a day (QID) | ORAL | Status: DC | PRN

## 2013-05-13 NOTE — Progress Notes (Signed)
   Subjective:    Patient ID: Donald [name], male    DOB: 1933-01-09, 79 y.o.   MRN: [id number]  URI  This is a new problem. The current episode started in the past 7 days. The problem has been unchanged. The maximum temperature recorded prior to his arrival was 100 - 100.9 F. Associated symptoms include congestion, coughing, diarrhea, joint pain, rhinorrhea, sinus pain, a sore throat and swollen glands. Pertinent negatives include no abdominal pain, chest pain, dysuria, joint swelling, nausea, rash, sneezing, vomiting or wheezing. He has tried acetaminophen, antihistamine, decongestant and sleep for the symptoms. The treatment provided mild relief.   He had a flu shot   Review of Systems  HENT: Positive for congestion, rhinorrhea and sore throat. Negative for sneezing.   Respiratory: Positive for cough. Negative for wheezing.   Cardiovascular: Negative for chest pain.  Gastrointestinal: Positive for diarrhea. Negative for nausea, vomiting and abdominal pain.  Genitourinary: Negative for dysuria.  Musculoskeletal: Positive for joint pain.  Skin: Negative for rash.       Objective:   Physical Exam  Constitutional: He is oriented to person, place, and time. He appears well-developed. No distress.  NAD  HENT:  Left Ear: External ear normal.  Mouth/Throat: Oropharynx is clear and moist. No oropharyngeal exudate.  eryth mucosa  Eyes: Conjunctivae are normal. Pupils are equal, round, and reactive to light.  Neck: Normal range of motion. No JVD present. No thyromegaly present.  Cardiovascular: Normal rate, regular rhythm, normal heart sounds and intact distal pulses.  Exam reveals no gallop and no friction rub.   No murmur heard. Pulmonary/Chest: Effort normal and breath sounds normal. No respiratory distress. He has no wheezes. He has no rales. He exhibits no tenderness.  Abdominal: Soft. Bowel sounds are normal. He exhibits no distension and no mass. There is no tenderness. There is no  rebound and no guarding.  Musculoskeletal: Normal range of motion. He exhibits no edema and no tenderness.  Lymphadenopathy:    He has no cervical adenopathy.  Neurological: He is alert and oriented to person, place, and time. He has normal reflexes. No cranial nerve deficit. He exhibits normal muscle tone. He displays a negative Romberg sign. Coordination and gait normal.  No meningeal signs  Skin: Skin is warm and dry. No rash noted.  Psychiatric: He has a normal mood and affect. His behavior is normal. Judgment and thought content normal.          Assessment & Plan:

## 2013-05-13 NOTE — Progress Notes (Signed)
Pre visit review using our clinic review tool, if applicable. No additional management support is needed unless otherwise documented below in the visit note. 

## 2013-05-13 NOTE — Assessment & Plan Note (Signed)
12/14 - viral syndrome/bronchitis Zpac if worse Hycodan prn

## 2013-05-13 NOTE — Assessment & Plan Note (Signed)
Worse, poss due to URI - monitor BP Continue with current prescription therapy as reflected on the Med list.

## 2013-05-13 NOTE — Patient Instructions (Signed)
Use over-the-counter  "cold" medicines  such as  "Afrin" nasal spray for nasal congestion as directed instead. Use" Delsym" or" Robitussin" cough syrup varietis for cough.  You can use plain "Tylenol" or "Advil" for fever, chills and achyness.  Please, make an appointment if you are not better or if you're worse.  

## 2013-05-31 ENCOUNTER — Other Ambulatory Visit: Payer: Self-pay | Admitting: Internal Medicine

## 2013-07-02 ENCOUNTER — Other Ambulatory Visit: Payer: Self-pay | Admitting: Internal Medicine

## 2013-07-17 ENCOUNTER — Ambulatory Visit (INDEPENDENT_AMBULATORY_CARE_PROVIDER_SITE_OTHER): Payer: No Typology Code available for payment source | Admitting: Internal Medicine

## 2013-07-17 ENCOUNTER — Ambulatory Visit (INDEPENDENT_AMBULATORY_CARE_PROVIDER_SITE_OTHER)
Admission: RE | Admit: 2013-07-17 | Discharge: 2013-07-17 | Disposition: A | Discharge status: Home / Self Care | Payer: No Typology Code available for payment source | Source: Ambulatory Visit | Attending: Internal Medicine | Admitting: Internal Medicine

## 2013-07-17 ENCOUNTER — Encounter: Payer: Self-pay | Admitting: Internal Medicine

## 2013-07-17 ENCOUNTER — Other Ambulatory Visit: Payer: Self-pay | Admitting: Internal Medicine

## 2013-07-17 VITALS — BP 148/78 | HR 53 | Temp 97.9°F | Wt 214.4 lb

## 2013-07-17 DIAGNOSIS — R059 Cough, unspecified: Secondary | ICD-10-CM

## 2013-07-17 DIAGNOSIS — R05 Cough: Secondary | ICD-10-CM

## 2013-07-17 DIAGNOSIS — J209 Acute bronchitis, unspecified: Secondary | ICD-10-CM

## 2013-07-17 MED ORDER — SULFAMETHOXAZOLE-TMP DS 800-160 MG PO TABS: 1.0000 | ORAL_TABLET | Freq: Two times a day (BID) | ORAL | Status: DC

## 2013-07-17 NOTE — Progress Notes (Signed)
Pre visit review using our clinic review tool, if applicable. No additional management support is needed unless otherwise documented below in the visit note. 

## 2013-07-17 NOTE — Progress Notes (Signed)
   Subjective:    Patient ID: Donald [name], male    DOB: Oct 23, 1932, 80 y.o.   MRN: [id number]  HPI Donald [name] presents for a 5 day h/o URI symptoms: drainage, felt febrile, productive cough. No SOB. No Rigors.  PMH, FamHx and SocHx reviewed for any changes and relevance.  Current Outpatient Prescriptions on File Prior to Visit  Medication Sig Dispense Refill  . acetaminophen (TYLENOL) 500 MG tablet Take 500 mg by mouth every 6 (six) hours as needed.        Marland Kitchen atenolol (TENORMIN) 25 MG tablet TAKE 1 TABLET ONCE DAILY.  90 tablet  3  . azithromycin (ZITHROMAX Z-PAK) 250 MG tablet As dirrected  6 each  0  . calcium carbonate (TUMS - DOSED IN MG ELEMENTAL CALCIUM) 500 MG chewable tablet Chew 1 tablet by mouth as needed.       . colesevelam (WELCHOL) 625 MG tablet Take one tablet by mouth once daily  30 tablet  0  . finasteride (PROSCAR) 5 MG tablet Take 5 mg by mouth daily.        . fluticasone (FLONASE) 50 MCG/ACT nasal spray USE 1 SPRAY EACH NOSTRIL ONCE DAILY.  SHAKE GENTLY   16 g  11  . furosemide (LASIX) 20 MG tablet TAKE 1 TABLET ONCE DAILY.  90 tablet  3  . HYDROcodone-homatropine (HYCODAN) 5-1.5 MG/5ML syrup Take 5 mLs by mouth every 6 (six) hours as needed for cough.  240 mL  0  . loratadine (CLARITIN) 10 MG tablet Take 10 mg by mouth as needed.       Marland Kitchen losartan (COZAAR) 100 MG tablet Take 1 tablet (100 mg total) by mouth daily.  90 tablet  3  . Multiple Vitamins-Minerals (MULTIVITAMIN,TX-MINERALS) tablet Take 1 tablet by mouth daily. Takes but not on regular basis      . Probiotic Product (PROBIOTIC DAILY PO) Take by mouth daily.      . vitamin C (ASCORBIC ACID) 500 MG tablet Take 500 mg by mouth daily. Takes but not on regular basis       No current facility-administered medications on file prior to visit.       Review of Systems System review is negative for any constitutional, cardiac, pulmonary, GI or neuro symptoms or complaints other than as described in the HPI.       Objective:   Physical Exam Filed Vitals:   07/17/13 1139  BP: 148/78  Pulse: 53  Temp: 97.9 F (36.6 C)   gen'l- WNWD man in no distress HEENT- TMs normal, throat clear Cor 2+ radial RRR PUlm - mild increased WOB, decreased breath sounds to percussion at the bases left worse then right, upper airway wheezing, negative egophony.  2 view CXR: IMPRESSION:  No acute cardiopulmonary abnormality seen.      Assessment & Plan:  Acute bronchitis -   Plan Chest x-ray today with recommendations for antibiotic to follow  For cough - Robitussin DM or the generic equivalent.  Hydrate  Tylenol 500 mg tab take 1 or 2 three times a day  Alarm symptoms - shortness of breath, fever that doesn't ocme down, nausea or vomiting that interferes with taking medication.  With normal chest x-ray - called in septra DS bid x 7 days.

## 2013-07-17 NOTE — Patient Instructions (Signed)
Thanks for coming in.  Your exam is suggestive of bronchitis vs possible pneumonia with decreased breath sounds at the left base.  Plan Chest x-ray today with recommendations for antibiotic to follow  For cough - Robitussin DM or the generic equivalent.  Hydrate  Tylenol 500 mg tab take 1 or 2 three times a day  Alarm symptoms - shortness of breath, fever that doesn't ocme down, nausea or vomiting that interferes with taking medication.

## 2013-07-31 ENCOUNTER — Other Ambulatory Visit: Payer: Self-pay | Admitting: Dermatology

## 2013-11-04 ENCOUNTER — Other Ambulatory Visit: Payer: Self-pay | Admitting: Dermatology

## 2013-11-20 ENCOUNTER — Other Ambulatory Visit (INDEPENDENT_AMBULATORY_CARE_PROVIDER_SITE_OTHER): Payer: No Typology Code available for payment source

## 2013-11-20 ENCOUNTER — Ambulatory Visit (INDEPENDENT_AMBULATORY_CARE_PROVIDER_SITE_OTHER): Payer: No Typology Code available for payment source | Admitting: Internal Medicine

## 2013-11-20 ENCOUNTER — Encounter: Payer: Self-pay | Admitting: Internal Medicine

## 2013-11-20 VITALS — BP 134/82 | HR 58 | Temp 98.1°F | Resp 16 | Ht 70.0 in | Wt 209.0 lb

## 2013-11-20 DIAGNOSIS — R10817 Generalized abdominal tenderness: Secondary | ICD-10-CM

## 2013-11-20 DIAGNOSIS — I700 Atherosclerosis of aorta: Secondary | ICD-10-CM

## 2013-11-20 DIAGNOSIS — E785 Hyperlipidemia, unspecified: Secondary | ICD-10-CM

## 2013-11-20 DIAGNOSIS — I498 Other specified cardiac arrhythmias: Secondary | ICD-10-CM

## 2013-11-20 DIAGNOSIS — R972 Elevated prostate specific antigen [PSA]: Secondary | ICD-10-CM

## 2013-11-20 DIAGNOSIS — F528 Other sexual dysfunction not due to a substance or known physiological condition: Secondary | ICD-10-CM

## 2013-11-20 DIAGNOSIS — I10 Essential (primary) hypertension: Secondary | ICD-10-CM

## 2013-11-20 DIAGNOSIS — J301 Allergic rhinitis due to pollen: Secondary | ICD-10-CM

## 2013-11-20 LAB — COMPREHENSIVE METABOLIC PANEL
ALT: 17 U/L (ref 0–53)
AST: 20 U/L (ref 0–37)
Albumin: 4.1 g/dL (ref 3.5–5.2)
Alkaline Phosphatase: 77 U/L (ref 39–117)
BILIRUBIN TOTAL: 0.7 mg/dL (ref 0.2–1.2)
BUN: 25 mg/dL — ABNORMAL HIGH (ref 6–23)
CO2: 27 mEq/L (ref 19–32)
CREATININE: 1 mg/dL (ref 0.4–1.5)
Calcium: 9.6 mg/dL (ref 8.4–10.5)
Chloride: 104 mEq/L (ref 96–112)
GFR: 81.31 mL/min (ref >60.00)
Glucose, Bld: 99 mg/dL (ref 70–99)
Potassium: 5.1 mEq/L (ref 3.5–5.1)
Sodium: 137 mEq/L (ref 135–145)
Total Protein: 6.9 g/dL (ref 6.0–8.3)

## 2013-11-20 LAB — URINALYSIS, ROUTINE W REFLEX MICROSCOPIC
Bilirubin Urine: NEGATIVE
HGB URINE DIPSTICK: NEGATIVE
Ketones, ur: NEGATIVE
Leukocytes, UA: NEGATIVE
NITRITE: NEGATIVE
PH: 6 (ref 5.0–8.0)
RBC / HPF: NONE SEEN (ref 0–?)
Specific Gravity, Urine: 1.015 (ref 1.000–1.030)
TOTAL PROTEIN, URINE-UPE24: NEGATIVE
Urine Glucose: NEGATIVE
Urobilinogen, UA: 0.2 (ref 0.0–1.0)
WBC UA: NONE SEEN (ref 0–?)

## 2013-11-20 LAB — LIPID PANEL
CHOLESTEROL: 190 mg/dL (ref 0–200)
HDL: 48.9 mg/dL (ref >39.00)
LDL Cholesterol: 122 mg/dL — ABNORMAL HIGH (ref 0–99)
NonHDL: 141.1
Total CHOL/HDL Ratio: 4
Triglycerides: 96 mg/dL (ref 0.0–149.0)
VLDL: 19.2 mg/dL (ref 0.0–40.0)

## 2013-11-20 LAB — CBC WITH DIFFERENTIAL/PLATELET
BASOS PCT: 0.4 % (ref 0.0–3.0)
Basophils Absolute: 0 10*3/uL (ref 0.0–0.1)
EOS PCT: 5 % (ref 0.0–5.0)
Eosinophils Absolute: 0.5 10*3/uL (ref 0.0–0.7)
HEMATOCRIT: 43.7 % (ref 39.0–52.0)
HEMOGLOBIN: 14.9 g/dL (ref 13.0–17.0)
LYMPHS ABS: 2.4 10*3/uL (ref 0.7–4.0)
Lymphocytes Relative: 23.9 % (ref 12.0–46.0)
MCHC: 34.1 g/dL (ref 30.0–36.0)
MCV: 96.4 fl (ref 78.0–100.0)
MONOS PCT: 7.6 % (ref 3.0–12.0)
Monocytes Absolute: 0.8 10*3/uL (ref 0.1–1.0)
Neutro Abs: 6.3 10*3/uL (ref 1.4–7.7)
Neutrophils Relative %: 63.1 % (ref 43.0–77.0)
Platelets: 206 10*3/uL (ref 150.0–400.0)
RBC: 4.53 Mil/uL (ref 4.22–5.81)
RDW: 13.7 % (ref 11.5–15.5)
WBC: 10 10*3/uL (ref 4.0–10.5)

## 2013-11-20 LAB — LIPASE: Lipase: 34 U/L (ref 11.0–59.0)

## 2013-11-20 LAB — FECAL OCCULT BLOOD, GUAIAC: FECAL OCCULT BLD: NEGATIVE

## 2013-11-20 LAB — PSA: PSA: 0.83 ng/mL (ref 0.10–4.00)

## 2013-11-20 LAB — AMYLASE: AMYLASE: 55 U/L (ref 27–131)

## 2013-11-20 LAB — TSH: TSH: 0.51 u[IU]/mL (ref 0.35–4.50)

## 2013-11-20 NOTE — Progress Notes (Signed)
Subjective:    Patient ID: Donald [name], male    DOB: 07/13/1932, 80 y.o.   MRN: [id number]  Abdominal Pain This is a recurrent problem. The current episode started 1 to 4 weeks ago. The onset quality is gradual. The problem occurs intermittently. The problem has been unchanged. The pain is located in the LLQ and RLQ. The pain is at a severity of 1/10. The pain is mild. The quality of the pain is aching. The abdominal pain does not radiate. Pertinent negatives include no anorexia, arthralgias, belching, constipation, diarrhea, dysuria, fever, flatus, frequency, headaches, hematochezia, hematuria, melena, myalgias, nausea, vomiting or weight loss. Nothing aggravates the pain. The pain is relieved by nothing. He has tried nothing for the symptoms. His past medical history is significant for abdominal surgery (hernia repair twice). There is no history of colon cancer, Crohn's disease, gallstones, GERD, irritable bowel syndrome, pancreatitis, PUD or ulcerative colitis.      Review of Systems  Constitutional: Negative.  Negative for fever, chills, weight loss, diaphoresis, appetite change and fatigue.  HENT: Negative.   Eyes: Negative.   Respiratory: Negative.  Negative for cough, shortness of breath, wheezing and stridor.   Cardiovascular: Negative.  Negative for chest pain, palpitations and leg swelling.  Gastrointestinal: Positive for abdominal pain. Negative for nausea, vomiting, diarrhea, constipation, blood in stool, melena, hematochezia, abdominal distention, anal bleeding, rectal pain, anorexia and flatus.  Endocrine: Negative.   Genitourinary: Negative.  Negative for dysuria, urgency, frequency, hematuria, flank pain, decreased urine volume, discharge, penile swelling, scrotal swelling, enuresis, difficulty urinating, genital sores, penile pain and testicular pain.  Musculoskeletal: Negative.  Negative for arthralgias and myalgias.  Skin: Negative.  Negative for rash.    Allergic/Immunologic: Negative.   Neurological: Negative.  Negative for headaches.  Hematological: Negative for adenopathy. Does not bruise/bleed easily.  Psychiatric/Behavioral: Negative.        Objective:   Physical Exam  Vitals reviewed. Constitutional: He is oriented to person, place, and time. He appears well-developed and well-nourished. No distress.  HENT:  Head: Normocephalic and atraumatic.  Mouth/Throat: Oropharynx is clear and moist. No oropharyngeal exudate.  Eyes: Conjunctivae are normal. Right eye exhibits no discharge. Left eye exhibits no discharge. No scleral icterus.  Neck: Normal range of motion. Neck supple. No JVD present. No tracheal deviation present. No thyromegaly present.  Cardiovascular: Normal rate, regular rhythm, normal heart sounds and intact distal pulses.  Exam reveals no gallop and no friction rub.   No murmur heard. Pulmonary/Chest: Effort normal and breath sounds normal. No stridor. No respiratory distress. He has no wheezes. He has no rales. He exhibits no tenderness.  Abdominal: Soft. Normal appearance and bowel sounds are normal. He exhibits no shifting dullness, no distension, no pulsatile liver, no fluid wave, no abdominal bruit, no ascites, no pulsatile midline mass and no mass. There is no hepatosplenomegaly, splenomegaly or hepatomegaly. There is tenderness in the right lower quadrant and left lower quadrant. There is no rigidity, no rebound, no guarding, no CVA tenderness, no tenderness at McBurney's point and negative Murphy's sign. No hernia. Hernia confirmed negative in the ventral area, confirmed negative in the right inguinal area and confirmed negative in the left inguinal area.  Genitourinary: Rectum normal, testes normal and penis normal. Rectal exam shows no external hemorrhoid, no internal hemorrhoid, no fissure, no mass, no tenderness and anal tone normal. Guaiac negative stool. Prostate is enlarged (rt lobe is 2+ BPH, lt lobe is normal).  Prostate is not tender. Right testis shows  no mass, no swelling and no tenderness. Right testis is descended. Left testis shows no mass, no swelling and no tenderness. Left testis is descended. Circumcised. No penile erythema or penile tenderness. No discharge found.  Musculoskeletal: Normal range of motion. He exhibits no edema and no tenderness.  Lymphadenopathy:    He has no cervical adenopathy.       Right: No inguinal adenopathy present.       Left: No inguinal adenopathy present.  Neurological: He is oriented to person, place, and time.  Skin: Skin is warm and dry. No rash noted. He is not diaphoretic. No erythema. No pallor.     Lab Results  Component Value Date   WBC 8.1 04/13/2009   HGB 15.3 07/16/2010   HCT 45.0 07/16/2010   PLT 166.0 04/13/2009   GLUCOSE 96 05/07/2013   CHOL 176 05/07/2013   TRIG 81.0 05/07/2013   HDL 52.90 05/07/2013   LDLDIRECT 138.8 04/18/2011   LDLCALC 107* 05/07/2013   ALT 19 05/07/2013   AST 18 05/07/2013   NA 140 05/07/2013   K 5.2* 05/07/2013   CL 105 05/07/2013   CREATININE 1.0 05/07/2013   BUN 26* 05/07/2013   CO2 30 05/07/2013   PSA 5.23* 04/11/2008       Assessment & Plan:

## 2013-11-20 NOTE — Patient Instructions (Signed)

## 2013-11-21 ENCOUNTER — Encounter: Payer: Self-pay | Admitting: Internal Medicine

## 2013-11-21 ENCOUNTER — Telehealth: Payer: Self-pay | Admitting: Internal Medicine

## 2013-11-21 NOTE — Telephone Encounter (Signed)
Relevant patient education assigned to patient using Emmi. ° °

## 2013-11-21 NOTE — Assessment & Plan Note (Signed)
His BP is well controlled His lytes and renal function are stable 

## 2013-11-21 NOTE — Assessment & Plan Note (Signed)
His exam and labs are unremarkable I do not feel like a hernia is causing his pain Will follow for now and if pain persists then will consider getting a CT scan done to look for a mass

## 2013-11-21 NOTE — Assessment & Plan Note (Signed)
He does not want to take a statin

## 2013-11-21 NOTE — Assessment & Plan Note (Signed)
PSA is normal now

## 2014-01-13 ENCOUNTER — Other Ambulatory Visit: Payer: Self-pay

## 2014-01-13 MED ORDER — FINASTERIDE 5 MG PO TABS: 5.0000 mg | ORAL_TABLET | Freq: Every day | ORAL | Status: DC

## 2014-02-03 ENCOUNTER — Encounter: Payer: Self-pay | Admitting: Internal Medicine

## 2014-02-03 ENCOUNTER — Other Ambulatory Visit (INDEPENDENT_AMBULATORY_CARE_PROVIDER_SITE_OTHER): Payer: No Typology Code available for payment source

## 2014-02-03 ENCOUNTER — Ambulatory Visit (INDEPENDENT_AMBULATORY_CARE_PROVIDER_SITE_OTHER): Payer: No Typology Code available for payment source | Admitting: Internal Medicine

## 2014-02-03 VITALS — BP 128/70 | HR 64 | Temp 98.7°F | Resp 16 | Ht 70.0 in | Wt 214.0 lb

## 2014-02-03 DIAGNOSIS — R0683 Snoring: Secondary | ICD-10-CM

## 2014-02-03 DIAGNOSIS — R079 Chest pain, unspecified: Secondary | ICD-10-CM | POA: Insufficient documentation

## 2014-02-03 DIAGNOSIS — E785 Hyperlipidemia, unspecified: Secondary | ICD-10-CM

## 2014-02-03 DIAGNOSIS — R072 Precordial pain: Secondary | ICD-10-CM

## 2014-02-03 DIAGNOSIS — G4733 Obstructive sleep apnea (adult) (pediatric): Secondary | ICD-10-CM | POA: Insufficient documentation

## 2014-02-03 DIAGNOSIS — Z23 Encounter for immunization: Secondary | ICD-10-CM

## 2014-02-03 DIAGNOSIS — R0609 Other forms of dyspnea: Secondary | ICD-10-CM

## 2014-02-03 DIAGNOSIS — R10817 Generalized abdominal tenderness: Secondary | ICD-10-CM

## 2014-02-03 DIAGNOSIS — R0989 Other specified symptoms and signs involving the circulatory and respiratory systems: Secondary | ICD-10-CM

## 2014-02-03 DIAGNOSIS — I10 Essential (primary) hypertension: Secondary | ICD-10-CM

## 2014-02-03 DIAGNOSIS — I700 Atherosclerosis of aorta: Secondary | ICD-10-CM

## 2014-02-03 LAB — BASIC METABOLIC PANEL
BUN: 18 mg/dL (ref 6–23)
CHLORIDE: 104 meq/L (ref 96–112)
CO2: 28 meq/L (ref 19–32)
Calcium: 9.1 mg/dL (ref 8.4–10.5)
Creatinine, Ser: 1 mg/dL (ref 0.4–1.5)
GFR: 73.21 mL/min (ref >60.00)
GLUCOSE: 88 mg/dL (ref 70–99)
POTASSIUM: 4.7 meq/L (ref 3.5–5.1)
Sodium: 139 mEq/L (ref 135–145)

## 2014-02-03 LAB — CBC WITH DIFFERENTIAL/PLATELET
BASOS PCT: 0.4 % (ref 0.0–3.0)
Basophils Absolute: 0 10*3/uL (ref 0.0–0.1)
Eosinophils Absolute: 0.2 10*3/uL (ref 0.0–0.7)
Eosinophils Relative: 3.3 % (ref 0.0–5.0)
HCT: 40.4 % (ref 39.0–52.0)
Hemoglobin: 14.2 g/dL (ref 13.0–17.0)
Lymphocytes Relative: 20.8 % (ref 12.0–46.0)
Lymphs Abs: 1.5 10*3/uL (ref 0.7–4.0)
MCHC: 35.1 g/dL (ref 30.0–36.0)
MCV: 96.9 fl (ref 78.0–100.0)
MONO ABS: 0.8 10*3/uL (ref 0.1–1.0)
MONOS PCT: 10.9 % (ref 3.0–12.0)
NEUTROS PCT: 64.6 % (ref 43.0–77.0)
Neutro Abs: 4.5 10*3/uL (ref 1.4–7.7)
Platelets: 179 10*3/uL (ref 150.0–400.0)
RBC: 4.17 Mil/uL — AB (ref 4.22–5.81)
RDW: 13.3 % (ref 11.5–15.5)
WBC: 7 10*3/uL (ref 4.0–10.5)

## 2014-02-03 LAB — CARDIAC PANEL
CK-MB: 2 ng/mL (ref 0.3–4.0)
RELATIVE INDEX: 2.9 calc — AB (ref 0.0–2.5)
Total CK: 70 U/L (ref 7–232)

## 2014-02-03 LAB — TROPONIN I: Troponin I: 0.04 ng/mL (ref <0.06)

## 2014-02-03 MED ORDER — ATORVASTATIN CALCIUM 40 MG PO TABS: 40.0000 mg | ORAL_TABLET | Freq: Every day | ORAL | Status: DC

## 2014-02-03 NOTE — Assessment & Plan Note (Signed)
His BP is well controlled 

## 2014-02-03 NOTE — Assessment & Plan Note (Signed)
His EKG is normal with the exception of benign PAC's. Cardiac enzymes are negative. He does have risk factors so I have asked him to have a lexiscan done.

## 2014-02-03 NOTE — Progress Notes (Signed)
Pre visit review using our clinic review tool, if applicable. No additional management support is needed unless otherwise documented below in the visit note. 

## 2014-02-03 NOTE — Patient Instructions (Signed)

## 2014-02-03 NOTE — Assessment & Plan Note (Signed)
I have ordered a CT scan of the abd to look for an AAA, mass, etc

## 2014-02-03 NOTE — Assessment & Plan Note (Signed)
I have asked him to start lipitor for risk reduction

## 2014-02-03 NOTE — Assessment & Plan Note (Signed)
Refer to sleep medicine for further evaluation

## 2014-02-03 NOTE — Progress Notes (Signed)
Subjective:    Patient ID: Donald [name], male    DOB: 1932/07/24, 80 y.o.   MRN: [id number]  Chest Pain  This is a recurrent problem. The current episode started 1 to 4 weeks ago. The onset quality is gradual. The problem occurs intermittently. The problem has been unchanged. The pain is present in the substernal region. The pain is at a severity of 1/10. The pain is mild. The quality of the pain is described as tightness. The pain does not radiate. Associated symptoms include abdominal pain (cramping in BLQ and dull ache in epigastric area). Pertinent negatives include no back pain, claudication, cough, diaphoresis, dizziness, exertional chest pressure, fever, headaches, hemoptysis, irregular heartbeat, leg pain, lower extremity edema, malaise/fatigue, nausea, near-syncope, numbness, orthopnea, palpitations, PND, shortness of breath, sputum production, syncope, vomiting or weakness. He has tried nothing for the symptoms. Risk factors include obesity, male gender, lack of exercise, stress and smoking/tobacco exposure.  His past medical history is significant for hyperlipidemia and hypertension. Prior diagnostic workup includes exercise treadmill test.      Review of Systems  Constitutional: Negative.  Negative for fever, chills, malaise/fatigue, diaphoresis, activity change, appetite change, fatigue and unexpected weight change.  HENT: Negative.  Negative for trouble swallowing and voice change.   Eyes: Negative.   Respiratory: Negative.  Negative for apnea, cough, hemoptysis, sputum production, choking, chest tightness, shortness of breath, wheezing and stridor.   Cardiovascular: Positive for chest pain. Negative for palpitations, orthopnea, claudication, leg swelling, syncope, PND and near-syncope.  Gastrointestinal: Positive for abdominal pain (cramping in BLQ and dull ache in epigastric area). Negative for nausea, vomiting, diarrhea, constipation, blood in stool, abdominal distention, anal  bleeding and rectal pain.  Endocrine: Negative.   Genitourinary: Negative.  Negative for urgency, hematuria, flank pain, difficulty urinating and testicular pain.  Musculoskeletal: Negative.  Negative for arthralgias, back pain, joint swelling, myalgias and neck pain.  Skin: Negative.  Negative for rash.  Allergic/Immunologic: Negative.   Neurological: Negative.  Negative for dizziness, weakness, numbness and headaches.  Hematological: Negative.  Negative for adenopathy. Does not bruise/bleed easily.  Psychiatric/Behavioral: Negative.        Objective:   Physical Exam  Vitals reviewed. Constitutional: He is oriented to person, place, and time. He appears well-developed and well-nourished. No distress.  HENT:  Head: Normocephalic and atraumatic.  Mouth/Throat: Oropharynx is clear and moist. No oropharyngeal exudate.  Eyes: Conjunctivae are normal. Right eye exhibits no discharge. Left eye exhibits no discharge. No scleral icterus.  Neck: Normal range of motion. Neck supple. No JVD present. No tracheal deviation present. No thyromegaly present.  Cardiovascular: Normal rate, regular rhythm, normal heart sounds and intact distal pulses.  Exam reveals no gallop and no friction rub.   No murmur heard. Pulmonary/Chest: Effort normal and breath sounds normal. No stridor. No respiratory distress. He has no wheezes. He has no rales. He exhibits no tenderness.  Abdominal: Soft. Bowel sounds are normal. He exhibits no distension and no mass. There is no tenderness. There is no rebound and no guarding.  Musculoskeletal: Normal range of motion. He exhibits no edema and no tenderness.  Lymphadenopathy:    He has no cervical adenopathy.  Neurological: He is oriented to person, place, and time.  Skin: Skin is warm and dry. No rash noted. He is not diaphoretic. No erythema. No pallor.  Psychiatric: He has a normal mood and affect. His behavior is normal. Judgment and thought content normal.     Lab  Results  Component  Value Date   WBC 10.0 11/20/2013   HGB 14.9 11/20/2013   HCT 43.7 11/20/2013   PLT 206.0 11/20/2013   GLUCOSE 99 11/20/2013   CHOL 190 11/20/2013   TRIG 96.0 11/20/2013   HDL 48.90 11/20/2013   LDLDIRECT 138.8 04/18/2011   LDLCALC 122* 11/20/2013   ALT 17 11/20/2013   AST 20 11/20/2013   NA 137 11/20/2013   K 5.1 11/20/2013   CL 104 11/20/2013   CREATININE 1.0 11/20/2013   BUN 25* 11/20/2013   CO2 27 11/20/2013   TSH 0.51 11/20/2013   PSA 0.83 11/20/2013       Assessment & Plan:

## 2014-02-10 ENCOUNTER — Encounter: Payer: Self-pay | Admitting: Pulmonary Disease

## 2014-02-10 ENCOUNTER — Ambulatory Visit (INDEPENDENT_AMBULATORY_CARE_PROVIDER_SITE_OTHER): Payer: No Typology Code available for payment source | Admitting: Pulmonary Disease

## 2014-02-10 VITALS — BP 112/88 | HR 157 | Temp 97.1°F | Ht 69.5 in | Wt 209.2 lb

## 2014-02-10 DIAGNOSIS — R0609 Other forms of dyspnea: Secondary | ICD-10-CM

## 2014-02-10 DIAGNOSIS — R0683 Snoring: Secondary | ICD-10-CM

## 2014-02-10 DIAGNOSIS — R0602 Shortness of breath: Secondary | ICD-10-CM

## 2014-02-10 DIAGNOSIS — R0989 Other specified symptoms and signs involving the circulatory and respiratory systems: Secondary | ICD-10-CM

## 2014-02-10 DIAGNOSIS — R0600 Dyspnea, unspecified: Secondary | ICD-10-CM

## 2014-02-10 NOTE — Patient Instructions (Addendum)
Schedule sleep study Breathing test showed lung capacity at 77% - can use albuterol pump if breathing worse

## 2014-02-10 NOTE — Progress Notes (Signed)
Subjective:    Patient ID: Donald [name], male    DOB: [birth year], 80 y.o.   MRN: [id number]  HPI 80 year old ex-smoker presents for evaluation of loud snoring and sleep disordered breathing. His snoring is so loud that he has had to move into the guest room. He has used dental appliances without any benefit, his son uses CPAP so he is aware of this modality of treatment. Bedtime is around 9 PM, sleep latency about 15 minutes, he does not have a preferred position, uses 3 pillows due to reflux symptoms, reports one to 2 nocturnal awakenings without any post void sleep latency and is out of bed by 6:30 AM feeling tired with occasional dryness of mouth. His weight is unchanged over the last 2 years. Epworth sleepiness score is 9.  Smoked a pack per day until he quit in 2000, about 40 pack years. He drinks a cocktail before dinner, and 1-2 glasses of wine after. He reports a scratchy sensation in his throat and a feeling of dyspnea especially in climbing stairs. He denies wheezing or frequent chest colds. Spirometry showed FEV1 ratio of 75, with FEV1 of 77% and FVC of 77% suggesting mild restriction CT chest in 04/2013 showed mild emphysema.  Past Medical History  Diagnosis Date  . Hypertension   . Bradycardia   . ED (erectile dysfunction)   . Personal history of other malignant neoplasm of skin   . Embolism and thrombosis of abdominal aorta   . Arthropathy, unspecified, site unspecified   . Prostatitis, unspecified   . Personal history of other diseases of digestive system   . Allergic rhinitis due to pollen   . Benign neoplasm of colon   . Diverticulosis   . Skin cancer     basal and squamous cell   Past Surgical History  Procedure Laterality Date  . Inguinal hernia repair      right  . Inguinal hernia repair      left  . Tonsillectomy    . Orif fracture of the elbow    . Squamous cell carcinoma excision    . Skin cancer excision  05/21/12    Squamous cell ca    No Known  Allergies  History   Social History  . Marital Status: Married    Spouse Name: N/A    Number of Children: 3  . Years of Education: 16   Occupational History  . Land    Social History Main Topics  . Smoking status: Former Smoker -- 1.00 packs/day    Types: Cigarettes, Cigars    Quit date: 09/18/2008  . Smokeless tobacco: Never Used     Comment: Has Occasional Cigar  . Alcohol Use: 10.5 oz/week    21 drink(s) per week  . Drug Use: No  . Sexual Activity: Not Currently   Other Topics Concern  . 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 married 1961. 2 sons- '64, '63 , I daughter- '66, 4 grandchildren. work: Museum/gallery curator, retired but still consults. 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    Family History  Problem Relation Age of Onset  . Cancer Mother     ovarian  . Other Father     renal disease- grief over loss of spouse  . Cancer Brother     prostate- died of hematologic disorder 2nd to chemo  . Diabetes Neg  Hx   . Coronary artery disease Neg Hx   . Pulmonary fibrosis Brother      Review of Systems Constitutional: negative for anorexia, fevers and sweats  Eyes: negative for irritation, redness and visual disturbance  Ears, nose, mouth, throat, and face: negative for earaches, epistaxis, nasal congestion and sore throat  Respiratory: negative for cough, dyspnea on exertion, sputum and wheezing  Cardiovascular: negative for chest pain, dyspnea, lower extremity edema, orthopnea, palpitations and syncope  Gastrointestinal: negative for abdominal pain, constipation, diarrhea, melena, nausea and vomiting  Genitourinary:negative for dysuria, frequency and hematuria  Hematologic/lymphatic: negative for bleeding, easy bruising and lymphadenopathy  Musculoskeletal:negative for arthralgias, muscle weakness and stiff joints  Neurological:  negative for coordination problems, gait problems, headaches and weakness  Endocrine: negative for diabetic symptoms including polydipsia, polyuria and weight loss     Objective:   Physical Exam  Gen. Pleasant, obese, in no distress, normal affect ENT - no lesions, no post nasal drip, class 2-3 airway Neck: No JVD, no thyromegaly, no carotid bruits Lungs: no use of accessory muscles, no dullness to percussion, decreased without rales or rhonchi  Cardiovascular: Rhythm regular, heart sounds  normal, no murmurs or gallops, no peripheral edema Abdomen: soft and non-tender, no hepatosplenomegaly, BS normal. Musculoskeletal: No deformities, no cyanosis or clubbing Neuro:  alert, non focal, no tremors       Assessment & Plan:

## 2014-02-10 NOTE — Assessment & Plan Note (Signed)
Given excessive daytime somnolence, narrow pharyngeal exam, witnessed apneas & loud snoring, obstructive sleep apnea is very likely & an overnight polysomnogram will be scheduled as a split study. The pathophysiology of obstructive sleep apnea , it's cardiovascular consequences & modes of treatment including CPAP were discused with the patient in detail & they evidenced understanding.  

## 2014-02-11 DIAGNOSIS — R0600 Dyspnea, unspecified: Secondary | ICD-10-CM | POA: Insufficient documentation

## 2014-02-11 NOTE — Assessment & Plan Note (Signed)
Spirometry seems to indicate restriction, rather than obstruction. A trial of bronchodilators may be indicated if his symptoms persist

## 2014-02-13 ENCOUNTER — Encounter: Payer: Self-pay | Admitting: Gastroenterology

## 2014-02-13 DIAGNOSIS — R933 Abnormal findings on diagnostic imaging of other parts of digestive tract: Secondary | ICD-10-CM

## 2014-02-13 HISTORY — DX: Abnormal findings on diagnostic imaging of other parts of digestive tract: R93.3

## 2014-02-18 ENCOUNTER — Encounter: Payer: Self-pay | Admitting: Cardiology

## 2014-02-18 ENCOUNTER — Encounter: Payer: Self-pay | Admitting: Internal Medicine

## 2014-02-18 ENCOUNTER — Ambulatory Visit
Admission: RE | Admit: 2014-02-18 | Discharge: 2014-02-18 | Disposition: A | Discharge status: Home / Self Care | Payer: Medicare Other | Source: Ambulatory Visit | Attending: Internal Medicine | Admitting: Internal Medicine

## 2014-02-18 ENCOUNTER — Other Ambulatory Visit: Payer: Self-pay | Admitting: Internal Medicine

## 2014-02-18 DIAGNOSIS — C161 Malignant neoplasm of fundus of stomach: Secondary | ICD-10-CM

## 2014-02-18 DIAGNOSIS — R10817 Generalized abdominal tenderness: Secondary | ICD-10-CM

## 2014-02-18 DIAGNOSIS — I700 Atherosclerosis of aorta: Secondary | ICD-10-CM

## 2014-02-20 ENCOUNTER — Encounter: Payer: Self-pay | Admitting: Physician Assistant

## 2014-02-20 ENCOUNTER — Encounter (HOSPITAL_COMMUNITY): Payer: Self-pay | Admitting: Pharmacy Technician

## 2014-02-20 ENCOUNTER — Ambulatory Visit (INDEPENDENT_AMBULATORY_CARE_PROVIDER_SITE_OTHER): Payer: Medicare Other | Admitting: Physician Assistant

## 2014-02-20 ENCOUNTER — Encounter: Payer: Self-pay | Admitting: *Deleted

## 2014-02-20 ENCOUNTER — Ambulatory Visit (HOSPITAL_COMMUNITY): Payer: Medicare Other | Attending: Cardiovascular Disease | Admitting: Radiology

## 2014-02-20 VITALS — BP 124/68 | HR 136 | Ht 70.0 in | Wt 205.0 lb

## 2014-02-20 VITALS — Ht 70.0 in | Wt 205.0 lb

## 2014-02-20 DIAGNOSIS — F1010 Alcohol abuse, uncomplicated: Secondary | ICD-10-CM

## 2014-02-20 DIAGNOSIS — I10 Essential (primary) hypertension: Secondary | ICD-10-CM

## 2014-02-20 DIAGNOSIS — R072 Precordial pain: Secondary | ICD-10-CM

## 2014-02-20 DIAGNOSIS — R0789 Other chest pain: Secondary | ICD-10-CM | POA: Diagnosis not present

## 2014-02-20 DIAGNOSIS — R933 Abnormal findings on diagnostic imaging of other parts of digestive tract: Secondary | ICD-10-CM | POA: Insufficient documentation

## 2014-02-20 DIAGNOSIS — Z7289 Other problems related to lifestyle: Secondary | ICD-10-CM

## 2014-02-20 DIAGNOSIS — I4892 Unspecified atrial flutter: Secondary | ICD-10-CM | POA: Insufficient documentation

## 2014-02-20 DIAGNOSIS — F1090 Alcohol use, unspecified, uncomplicated: Secondary | ICD-10-CM | POA: Insufficient documentation

## 2014-02-20 DIAGNOSIS — E669 Obesity, unspecified: Secondary | ICD-10-CM

## 2014-02-20 LAB — BASIC METABOLIC PANEL
BUN: 22 mg/dL (ref 6–23)
CALCIUM: 8.9 mg/dL (ref 8.4–10.5)
CO2: 26 mEq/L (ref 19–32)
CREATININE: 0.9 mg/dL (ref 0.4–1.5)
Chloride: 104 mEq/L (ref 96–112)
GFR: 86.49 mL/min (ref >60.00)
Glucose, Bld: 93 mg/dL (ref 70–99)
Potassium: 4.3 mEq/L (ref 3.5–5.1)
Sodium: 137 mEq/L (ref 135–145)

## 2014-02-20 LAB — CBC
HEMATOCRIT: 47.1 % (ref 39.0–52.0)
Hemoglobin: 15.5 g/dL (ref 13.0–17.0)
MCHC: 32.9 g/dL (ref 30.0–36.0)
MCV: 100.3 fl — ABNORMAL HIGH (ref 78.0–100.0)
Platelets: 231 10*3/uL (ref 150.0–400.0)
RBC: 4.69 Mil/uL (ref 4.22–5.81)
RDW: 13.7 % (ref 11.5–15.5)
WBC: 10.7 10*3/uL — AB (ref 4.0–10.5)

## 2014-02-20 LAB — PROTIME-INR
INR: 1.1 ratio — AB (ref 0.8–1.0)
Prothrombin Time: 12.5 s (ref 9.6–13.1)

## 2014-02-20 LAB — TSH: TSH: 0.42 u[IU]/mL (ref 0.35–4.50)

## 2014-02-20 MED ORDER — APIXABAN 5 MG PO TABS: 5.0000 mg | ORAL_TABLET | Freq: Two times a day (BID) | ORAL | Status: DC

## 2014-02-20 MED ORDER — ATENOLOL 25 MG PO TABS: 25.0000 mg | ORAL_TABLET | Freq: Two times a day (BID) | ORAL | Status: DC

## 2014-02-20 MED ORDER — ATENOLOL 50 MG PO TABS: 50.0000 mg | ORAL_TABLET | Freq: Every day | ORAL | Status: DC

## 2014-02-20 MED ORDER — TECHNETIUM TC 99M SESTAMIBI GENERIC - CARDIOLITE
11.0000 | Freq: Once | INTRAVENOUS | Status: AC | PRN
Start: 2014-02-20 — End: 2014-02-20
  Administered 2014-02-20: 11 via INTRAVENOUS

## 2014-02-20 MED ORDER — ATENOLOL 25 MG PO TABS: 50.0000 mg | ORAL_TABLET | Freq: Every day | ORAL | Status: DC

## 2014-02-20 NOTE — Progress Notes (Signed)
[facility] [address] [place], [place] [address] [phone]    Cardiology Nuclear Med Study  Donald [name] is a 80 y.o. male     MRN : [id number]     DOB: 29-Nov-1932  Procedure Date: 02/20/2014  Nuclear Med Background Indication for Stress Test:  Evaluation for Ischemia History:  2011 MPI-nml, EF 68% Cardiac Risk Factors: Hypertension  Symptoms:  Chest Tightness   Nuclear Pre-Procedure Caffeine/Decaff Intake:  None> 12 hrs NPO After: 7:00pm   Lungs:  n/a O2 Sat: n/a IV 0.9% NS with Angio Cath:  22g  IV Site: R Forearm x 1, tolerated well IV Started by:  Irven Baltimore, RN  Chest Size (in):  44 Cup Size: n/a  Height: 5\' 10"  (1.778 m)  Weight:  205 lb (92.987 kg)  BMI:  Body mass index is 29.41 kg/(m^2). Tech Comments: No medications this am. Patient held Atenolol x 24 hrs, and took Cozaar last night.Irven Baltimore, RN.    Nuclear Med Study 1 or 2 day study: 1 day  Stress Test Type:  n/a  Reading MD: N/A  Order Authorizing Provider:  [name], MD  Resting Radionuclide: Technetium 49m Sestamibi  Resting Radionuclide Dose: 11.0 mCi   Stress Radionuclide:  Technetium 97m Sestamibi  Stress Radionuclide Dose: n/a          Stress Protocol Rest HR: 144 Stress HR: n/a  Rest BP: 131/102 Stress BP: n/a  Exercise Time (min): n/a METS: n/a           Dose of Adenosine (mg):  n/a Dose of Lexiscan: n/a mg  Dose of Atropine (mg): n/a Dose of Dobutamine: n/a mcg/kg/min (at max HR)  Stress Test Technologist: n/a  Nuclear Technologist:  Donald [name], CNMT     Rest Procedure:  Myocardial perfusion imaging was performed at rest 45 minutes following the intravenous administration of Technetium 31m Sestamibi. Stress images cancelled due to arrhythmia. Rest ECG: atrial flutter  Stress Procedure:  n/a Stress ECG: n/a  QPS Raw Data Images:  Normal; no motion artifact; normal heart/lung ratio. Stress Images:  n/a Rest Images:  Normal  homogeneous uptake in all areas of the myocardium. Subtraction (SDS):  Normal Transient Ischemic Dilatation (Normal <1.22):  n/a Lung/Heart Ratio (Normal <0.45):  0.43  Quantitative Gated Spect Images QGS EDV:  n/a QGS ESV:  n/a  Impression Exercise Capacity:  n/a BP Response:  n/a Clinical Symptoms:  n/a ECG Impression:    Comparison with Prior Nuclear Study: No previous nuclear study performed  Overall Impression:  Normal myocardial perfusion at rest  LV Ejection Fraction: n/a.  LV Wall Motion:  n/a   Sanda Klein, MD, [facility] [phone] office [phone] pager

## 2014-02-20 NOTE — Patient Instructions (Addendum)
Your physician has recommended you make the following change in your medication:   START TAKING 50 MG ATENOLOL  ONCE A DAY  START TAKING ELIQUIS 5 MG ONCE A DAY    .Your physician recommends that you return for lab work in:  BMET CBC TSH  PT/INR    Your physician has recommended that you wear an event monitor FOR 30 DAYS . Event monitors are medical devices that record the heart's electrical activity. Doctors most often [place] these monitors to diagnose arrhythmias. Arrhythmias are problems with the speed or rhythm of the heartbeat. The monitor is a small, portable device. You can wear one while you do your normal daily activities. This is usually used to diagnose what is causing palpitations/syncope (passing out).    YOU WILL BE CONTACTED AFTER YOU RETURN FROM TRIP    Electrical Cardioversion Electrical cardioversion is the delivery of a jolt of electricity to change the rhythm of the heart. Sticky patches or metal paddles are placed on the chest to deliver the electricity from a device. This is done to restore a normal rhythm. A rhythm that is too fast or not regular keeps the heart from pumping well. Electrical cardioversion is done in an emergency if:   There is low or no blood pressure as a result of the heart rhythm.   Normal rhythm must be restored as fast as possible to protect the brain and heart from further damage.   It may save a life. Cardioversion may be done for heart rhythms that are not immediately life threatening, such as atrial fibrillation or flutter, in which:   The heart is beating too fast or is not regular.   Medicine to change the rhythm has not worked.   It is safe to wait in order to allow time for preparation.  Symptoms of the abnormal rhythm are bothersome.  The risk of stroke and other serious problems can be reduced. LET [facility] CARE PROVIDER KNOW ABOUT:   Any allergies you have.  All medicines you are taking, including vitamins, herbs, eye  drops, creams, and over-the-counter medicines.  Previous problems you or members of your family have had with the use of anesthetics.   Any blood disorders you have.   Previous surgeries you have had.   Medical conditions you have. RISKS AND COMPLICATIONS  Generally, this is a safe procedure. However, problems can occur and include:   Breathing problems related to the anesthetic used.  A blood clot that breaks free and travels to other parts of your body. This could cause a stroke or other problems. The risk of this is lowered by use of blood-thinning medicine (anticoagulant) prior to the procedure.  Cardiac arrest (rare). BEFORE THE PROCEDURE   You may have tests to detect blood clots in your heart and to evaluate heart function.  You may start taking anticoagulants so your blood does not clot as easily.   Medicines may be given to help stabilize your heart rate and rhythm. PROCEDURE  You will be given medicine through an IV tube to reduce discomfort and make you sleepy (sedative).   An electrical shock will be delivered. AFTER THE PROCEDURE Your heart rhythm will be watched to make sure it does not change.  Document Released: 04/22/2002 Document Revised: 09/16/2013 Document Reviewed: 11/14/2012 [facility] Patient Information 2015 [place], [place]. This information is not intended to replace advice given to you by your health care provider. Make sure you discuss any questions you have with your health care  provider.   Transesophageal Echocardiogram Transesophageal echocardiography (TEE) is a special type of test that produces images of the heart by using sound waves (echocardiogram). This type of echocardiography can obtain better images of the heart than standard echocardiography. TEE is done by passing a flexible tube down the esophagus. The heart is located in front of the esophagus. Because the heart and esophagus are close to one another, your health care provider can  take very clear, detailed pictures of the heart via ultrasound waves. TEE may be done:  If your health care provider needs more information based on standard echocardiography findings.  If you had a stroke. This might have happened because a clot formed in your heart. TEE can visualize different areas of the heart and check for clots.  To check valve anatomy and function.  To check for infection on the inside of your heart (endocarditis).  To evaluate the dividing wall (septum) of the heart and presence of a hole that did not close after birth (patent foramen ovale or atrial septal defect).  To help diagnose a tear in the wall of the aorta (aortic dissection).  During cardiac valve surgery. This allows the surgeon to assess the valve repair before closing the chest.  During a variety of other cardiac procedures to guide positioning of catheters.  Sometimes before a cardioversion, which is a shock to convert heart rhythm back to normal. LET [facility] CARE PROVIDER KNOW ABOUT:   Any allergies you have.  All medicines you are taking, including vitamins, herbs, eye drops, creams, and over-the-counter medicines.  Previous problems you or members of your family have had with the use of anesthetics.  Any blood disorders you have.  Previous surgeries you have had.  Medical conditions you have.  Swallowing difficulties.  An esophageal obstruction. RISKS AND COMPLICATIONS  Generally, TEE is a safe procedure. However, as with any procedure, complications can occur. Possible complications include an esophageal tear (rupture). BEFORE THE PROCEDURE   Do not eat or drink for 6 hours before the procedure or as directed by your health care provider.  Arrange for someone to drive you home after the procedure. Do not drive yourself home. During the procedure, you will be given medicines that can continue to make you feel drowsy and can impair your reflexes.  An IV access tube will be  started in the arm. PROCEDURE   A medicine to help you relax (sedative) will be given through the IV access tube.  A medicine may be sprayed or gargled to numb the back of the throat.  Your blood pressure, heart rate, and breathing (vital signs) will be monitored during the procedure.  The TEE probe is a long, flexible tube. The tip of the probe is placed into the back of the mouth, and you will be asked to swallow. This helps to pass the tip of the probe into the esophagus. Once the tip of the probe is in the correct area, your health care provider can take pictures of the heart.  TEE is usually not a painful procedure. You may feel the probe press against the back of the throat. The probe does not enter the trachea and does not affect your breathing. AFTER THE PROCEDURE   You will be in bed, resting, until you have fully returned to consciousness.  When you first awaken, your throat may feel slightly sore and will probably still feel numb. This will improve slowly over time.  You will not be allowed to eat  or drink until it is clear that the numbness has improved.  Once you have been able to drink, urinate, and sit on the edge of the bed without feeling sick to your stomach (nausea) or dizzy, you may be cleared to go home.  You should have a friend or family member with you for the next 24 hours after your procedure. Document Released: 07/23/2002 Document Revised: 05/07/2013 Document Reviewed: 11/01/2012 [facility] Patient Information 2015 [place], [place]. This information is not intended to replace advice given to you by your health care provider. Make sure you discuss any questions you have with your health care provider.

## 2014-02-20 NOTE — Progress Notes (Signed)
[place], [address], [place]  [address] Phone: [phone] Fax:  [phone]  Date:  02/20/2014   Patient ID:  Donald, [name] 1932/10/02, MRN [id number]   PCP:  [name], MD  Cardiologist:  New to Dr. Caryl Comes   History of Present Illness - New Patient: Donald [name] is an active 80 y.o. male with no prior cardiac history except normal nuc 2011, but a history of HTN, HLD, habitual alcohol use (4 drinks/day), skin cancer, recently abnormal abdominal CT who was added onto my schedule due to newly recognized atrial flutter with 2:1 conduction. He recently saw his PCP for 2-3 episodes of chest discomfort lasting about 10-15 minutes at a time, associated with a sensation of higher heart rate. These have typically occurred at night. 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 Outpatient EKG on 02/03/14 showed NSR 63bpm with brief run of 2 PACs. He was set up for a nuclear stress test today. He underwent resting images. When hooked up to the EKG for the stress images he was noted to be in atrial flutter with 2:1 conduction, HR 142. He had not taken his atenolol since yesterday morning (per our pre-stress test instructions). He was given 25mg  of atenolol after the test and HR came down to 130's. Interestingly he's not really aware of his heart rate right now. He denies any acute CP, SOB, nausea, weight change, syncope. No h/o bleeding issues, ongoing signs of bleeding or history of TIA/CVA.  He was recently seen by pulm for snoring and referred for a sleep study. He also had abdominal tenderness by PCP visit and CT abdomen/pelvis 02/18/14 showed profound thickening of the fundus and cardia of the stomach, suspicious for potential neoplasm; colonic diverticulosis without diverticulitis (known), liver/kidney cysts, extensive atherosclerosis, including right  coronary artery disease. He is awaiting outpatient GI referral.   Recent Labs: 11/20/2013: ALT 17; HDL Cholesterol by NMR 48.90; LDL (calc) 122*; TSH 0.51  02/03/2014: Creatinine 1.0; Hemoglobin 14.2; Potassium 4.7   Wt Readings from Last 3 Encounters:  02/20/14 205 lb (92.987 kg)  02/20/14 205 lb (92.987 kg)  02/10/14 209 lb 3.2 oz (94.892 kg)     Past Medical History  Diagnosis Date  . Hypertension   . Bradycardia   . ED (erectile dysfunction)   . Arthropathy, unspecified, site unspecified   . Prostatitis, unspecified   . Personal history of other diseases of digestive system   . Allergic rhinitis due to pollen   . Benign neoplasm of colon   . Diverticulosis   . Skin cancer     basal and squamous cell  . Abnormal CT scan, stomach     a. Profound thickening of the fundus and cardia of the stomach, suspicious for potential neoplasm by CT 02/2014. Referred to GI.  Marland Kitchen Atherosclerosis     a. Noted by abdominal CT 02/2014 (h/o normal nuc 2011).  . Habitual alcohol use   . Snoring     a. Pending OSA eval by pulm.  . Hyperlipidemia   . Obesity     Current Outpatient Prescriptions  Medication Sig Dispense Refill  . atorvastatin (LIPITOR) 40 MG tablet Take 1 tablet (40 mg total) by mouth daily.  90 tablet  3  . finasteride (PROSCAR) 5 MG tablet Take 1 tablet (5 mg total) by  mouth daily.  30 tablet  11  . fluticasone (FLONASE) 50 MCG/ACT nasal spray USE 1 SPRAY EACH NOSTRIL ONCE DAILY.   SHAKE GENTLY   16 g  11  . furosemide (LASIX) 20 MG tablet TAKE 1 TABLET ONCE DAILY.  90 tablet  3  . loratadine (CLARITIN) 10 MG tablet Take 10 mg by mouth as needed.       Marland Kitchen losartan (COZAAR) 100 MG tablet Take 1 tablet (100 mg total) by mouth daily.  90 tablet  3  . Multiple Vitamins-Minerals (EYE VITAMINS PO) Take 1 tablet by mouth 2 (two) times daily.      . Multiple Vitamins-Minerals (MULTIVITAMIN,TX-MINERALS) tablet Take 1 tablet by mouth daily. Takes but not on regular basis      .  atenolol (TENORMIN) 25 MG tablet Take 1 tablet (25 mg total) by mouth daily.  30 tablet  0   No current facility-administered medications for this visit.    Allergies:   Review of patient's allergies indicates no known allergies.   Social History:  The patient  reports that he quit smoking about 5 years ago. His smoking use included Cigarettes and Cigars. He smoked 1.00 pack per day. He has never used smokeless tobacco. He reports that he drinks about 10.5 ounces of alcohol per week. He reports that he does not use illicit drugs.   Family History:  The patient's family history includes Cancer in his brother and mother; Other in his father; Pulmonary fibrosis in his brother. There is no history of Diabetes or Coronary artery disease.   ROS:  Please see the history of present illness.  All other systems reviewed and negative.  EKG:  Initial EKG: atrial flutter 142bpm with 2:1 conduction with nonspecific ST abnormality After 25mg  of atenolol, atrial flutter 136bpm with variable AVB  Signed, Dayna Dunn, PA-C  02/20/2014 1:32 PM   PHYSICAL EXAM:  VS:  BP 124/68  Pulse 136  Ht 5\' 10"  (1.778 m)  Wt 205 lb (92.987 kg)  BMI 29.41 kg/m2 Well nourished, well developed, in no acute distress HEENT: normal Neck: no JVD Cardiac:  normal S1, S2; rapid but RRR; no murmur Lungs:  clear to auscultation bilaterally, no wheezing, rhonchi or rales Abd: soft, nontender, no hepatomegaly Ext: no edema Skin: warm and dry Neuro:  moves all extremities spontaneously, no focal abnormalities noted  ASSESSMENT AND PLAN:  1. Atrial flutter  The patient presented with asymptomatic atrial flutter with 2:1 conduction. His CHADS-VASc score prompts the need for anticoagulation.  Normally, catheter ablation would be the appropriate next antiarrhythmic intervention; however, he has episodes that awaken him at night and are associated with palpitations and discomfort raising the possibility of paroxysmal atrial  fibrillation.    For now, we will work on restoring sinus rhythm. We'll begin him on anticoagulation and anticipate TE guided cardioversion in 72 hours, i.e. on Monday. We will utilize a 30 day event recorder to assess his arrhythmia  and is not fibrillation is identified we will proceed with catheter ablation of his flutter substrate.  He will need to subsequent evaluation for his chest pain syndrome and would anticipate Myoview scanning.  He is also noted to have gastric thickening the cause of which is on certain; GI referral has been made. Hopefully we can have him back in sinus rhythm the time for them to do whatever they need to do without the need for anticoagulants concurrently.     Increase atenolol to 50mg  daily - start Eliquis 5mg   BID - Update labs to ensure stability, check thyroid function - 30 day monitor to exclude AF - TEE/DCCV on Monday - schedule for ablation 3-4 weeks as primary strategy - eventual re-nuc - await GI eval

## 2014-02-24 ENCOUNTER — Ambulatory Visit (HOSPITAL_COMMUNITY)
Admission: RE | Admit: 2014-02-24 | Discharge: 2014-02-24 | Disposition: A | Discharge status: Home / Self Care | Payer: Medicare Other | Source: Ambulatory Visit | Attending: Cardiology | Admitting: Cardiology

## 2014-02-24 ENCOUNTER — Encounter (HOSPITAL_COMMUNITY): Payer: Medicare Other | Admitting: Certified Registered"

## 2014-02-24 ENCOUNTER — Ambulatory Visit (HOSPITAL_COMMUNITY): Payer: Medicare Other | Admitting: Certified Registered"

## 2014-02-24 ENCOUNTER — Encounter (HOSPITAL_COMMUNITY): Payer: Self-pay

## 2014-02-24 ENCOUNTER — Encounter (HOSPITAL_COMMUNITY)
Admission: RE | Disposition: A | Discharge status: Home / Self Care | Payer: Self-pay | Source: Ambulatory Visit | Attending: Cardiology

## 2014-02-24 DIAGNOSIS — K5790 Diverticulosis of intestine, part unspecified, without perforation or abscess without bleeding: Secondary | ICD-10-CM | POA: Diagnosis not present

## 2014-02-24 DIAGNOSIS — I4892 Unspecified atrial flutter: Secondary | ICD-10-CM

## 2014-02-24 DIAGNOSIS — Z87891 Personal history of nicotine dependence: Secondary | ICD-10-CM | POA: Insufficient documentation

## 2014-02-24 DIAGNOSIS — E669 Obesity, unspecified: Secondary | ICD-10-CM | POA: Insufficient documentation

## 2014-02-24 DIAGNOSIS — Z79899 Other long term (current) drug therapy: Secondary | ICD-10-CM | POA: Diagnosis not present

## 2014-02-24 DIAGNOSIS — I2510 Atherosclerotic heart disease of native coronary artery without angina pectoris: Secondary | ICD-10-CM | POA: Diagnosis not present

## 2014-02-24 DIAGNOSIS — I739 Peripheral vascular disease, unspecified: Secondary | ICD-10-CM | POA: Diagnosis not present

## 2014-02-24 DIAGNOSIS — I10 Essential (primary) hypertension: Secondary | ICD-10-CM | POA: Insufficient documentation

## 2014-02-24 DIAGNOSIS — I341 Nonrheumatic mitral (valve) prolapse: Secondary | ICD-10-CM

## 2014-02-24 DIAGNOSIS — E785 Hyperlipidemia, unspecified: Secondary | ICD-10-CM | POA: Insufficient documentation

## 2014-02-24 DIAGNOSIS — I4891 Unspecified atrial fibrillation: Secondary | ICD-10-CM | POA: Diagnosis present

## 2014-02-24 DIAGNOSIS — N529 Male erectile dysfunction, unspecified: Secondary | ICD-10-CM | POA: Insufficient documentation

## 2014-02-24 HISTORY — DX: Unspecified atrial flutter: I48.92

## 2014-02-24 HISTORY — PX: CARDIOVERSION: SHX1299

## 2014-02-24 HISTORY — PX: TEE WITHOUT CARDIOVERSION: SHX5443

## 2014-02-24 SURGERY — ECHOCARDIOGRAM, TRANSESOPHAGEAL
Anesthesia: Monitor Anesthesia Care

## 2014-02-24 MED ORDER — PROPOFOL INFUSION 10 MG/ML OPTIME
INTRAVENOUS | Status: DC | PRN
  Administered 2014-02-24: 100 ug/kg/min via INTRAVENOUS

## 2014-02-24 MED ORDER — DIPHENHYDRAMINE HCL 50 MG/ML IJ SOLN
INTRAMUSCULAR | Status: AC
  Filled 2014-02-24: qty 1

## 2014-02-24 MED ORDER — BUTAMBEN-TETRACAINE-BENZOCAINE 2-2-14 % EX AERO
INHALATION_SPRAY | CUTANEOUS | Status: DC | PRN
  Administered 2014-02-24: 2 via TOPICAL

## 2014-02-24 MED ORDER — LACTATED RINGERS IV SOLN
INTRAVENOUS | Status: DC
  Administered 2014-02-24: 10:00:00 via INTRAVENOUS

## 2014-02-24 MED ORDER — EPHEDRINE SULFATE 50 MG/ML IJ SOLN
INTRAMUSCULAR | Status: DC | PRN
  Administered 2014-02-24: 15 mg via INTRAVENOUS

## 2014-02-24 MED ORDER — MIDAZOLAM HCL 5 MG/ML IJ SOLN
INTRAMUSCULAR | Status: AC
  Filled 2014-02-24: qty 2

## 2014-02-24 MED ORDER — FENTANYL CITRATE 0.05 MG/ML IJ SOLN
INTRAMUSCULAR | Status: AC
  Filled 2014-02-24: qty 2

## 2014-02-24 MED ORDER — SODIUM CHLORIDE 0.9 % IV SOLN: INTRAVENOUS | Status: DC

## 2014-02-24 MED ORDER — LIDOCAINE HCL (CARDIAC) 20 MG/ML IV SOLN
INTRAVENOUS | Status: DC | PRN
  Administered 2014-02-24: 60 mg via INTRAVENOUS

## 2014-02-24 MED ORDER — LACTATED RINGERS IV SOLN: INTRAVENOUS | Status: DC

## 2014-02-24 NOTE — Anesthesia Postprocedure Evaluation (Signed)
  Anesthesia Post-op Note  Patient: Donald [name]  Procedure(s) Performed: Procedure(s): TRANSESOPHAGEAL ECHOCARDIOGRAM (TEE) (N/A) CARDIOVERSION (N/A)  Patient Location: PACU  Anesthesia Type:General  Level of Consciousness: awake, oriented, sedated and patient cooperative  Airway and Oxygen Therapy: Patient Spontanous Breathing  Post-op Pain: none  Post-op Assessment: Post-op Vital signs reviewed, Patient's Cardiovascular Status Stable, Respiratory Function Stable, Patent Airway and No signs of Nausea or vomiting  Post-op Vital Signs: stable  Last Vitals:  Filed Vitals:   02/24/14 0920  BP: 132/87  Pulse: 146  Temp: 36.4 C  Resp: 22    Complications: No apparent anesthesia complications

## 2014-02-24 NOTE — Transfer of Care (Signed)
Immediate Anesthesia Transfer of Care Note  Patient: Donald [name]  Procedure(s) Performed: Procedure(s): TRANSESOPHAGEAL ECHOCARDIOGRAM (TEE) (N/A) CARDIOVERSION (N/A)  Patient Location: PACU  Anesthesia Type:MAC  Level of Consciousness: awake, alert , oriented and patient cooperative  Airway & Oxygen Therapy: Patient Spontanous Breathing and Patient connected to nasal cannula oxygen  Post-op Assessment: Report given to PACU RN, Post -op Vital signs reviewed and stable and Patient moving all extremities  Post vital signs: Reviewed and stable  Complications: No apparent anesthesia complications

## 2014-02-24 NOTE — Anesthesia Preprocedure Evaluation (Signed)
Anesthesia Evaluation  Patient identified by MRN, date of birth, ID band Patient awake    Reviewed: Allergy & Precautions, H&P , NPO status , Patient's Chart, lab work & pertinent test results  Airway       Dental   Pulmonary shortness of breath, former smoker,          Cardiovascular hypertension, + Peripheral Vascular Disease + dysrhythmias Atrial Fibrillation     Neuro/Psych    GI/Hepatic   Endo/Other    Renal/GU      Musculoskeletal   Abdominal   Peds  Hematology   Anesthesia Other Findings   Reproductive/Obstetrics                           Anesthesia Physical Anesthesia Plan  ASA: III  Anesthesia Plan: General   Post-op Pain Management:    Induction: Intravenous  Airway Management Planned: Mask  Additional Equipment:   Intra-op Plan:   Post-operative Plan:   Informed Consent: I have reviewed the patients History and Physical, chart, labs and discussed the procedure including the risks, benefits and alternatives for the proposed anesthesia with the patient or authorized representative who has indicated his/her understanding and acceptance.     Plan Discussed with: CRNA, Anesthesiologist and Surgeon  Anesthesia Plan Comments:         Anesthesia Quick Evaluation

## 2014-02-24 NOTE — CV Procedure (Addendum)
See full TEE report in [employer]; patient sedated by anesthesia with lidocaine 60 mg and diprovan 200 mg IV total; no LAA thrombus; patient subsequently had DCCV with 120 J to NSR; no immediate complications; continue apixaban. Kirk Ruths

## 2014-02-24 NOTE — Discharge Instructions (Addendum)
Transesophageal Echocardiogram/Cardioversion Care After Refer to this sheet in the next few weeks. These instructions provide you with information on caring for yourself after your procedure. Your caregiver may also give you more specific instructions. Your treatment has been planned according to current medical practices, but problems sometimes occur. Call your caregiver if you have any problems or questions after your procedure. HOME CARE INSTRUCTIONS  If you were given medicine to help you relax (sedative), do not drive, operate machinery, or sign important documents for 24 hours.  Avoid alcohol and hot or warm beverages for the first 24 hours after the procedure.  Only take over-the-counter or prescription medicines for pain, discomfort, or fever as directed by your caregiver. You may resume taking your normal medicines unless your caregiver tells you otherwise. Ask your caregiver when you may resume taking medicines that may cause bleeding, such as aspirin, clopidogrel, or warfarin.  You may return to your normal diet and activities on the day after your procedure, or as directed by your caregiver. Walking may help to reduce any bloated feeling in your abdomen.  Drink enough fluids to keep your urine clear or pale yellow.  You may gargle with salt water if you have a sore throat. SEEK IMMEDIATE MEDICAL CARE IF:  You have severe nausea or vomiting.  You have severe abdominal pain, abdominal cramps that last longer than 6 hours, or abdominal swelling (distention).  You have severe shoulder or back pain.  You have trouble swallowing.  You have shortness of breath, your breathing is shallow, or you are breathing faster than normal.  You have a fever or a rapid heartbeat.  You vomit blood or material that looks like coffee grounds.  You have bloody, black, or tarry stools. MAKE SURE YOU:  Understand these instructions.  Will watch your condition.  Will get help right away if you  are not doing well or get worse. Document Released: 12/15/2003 Document Revised: 09/16/2013 Document Reviewed: 08/02/2011 [facility] Patient Information 2015 [place], [place]. This information is not intended to replace advice given to you by your health care provider. Make sure you discuss any questions you have with your health care provider.

## 2014-02-24 NOTE — H&P (Signed)
Donald [name] Description: Male DOB: Oct 01, 1932  02/20/2014 11:15 AM Office Visit Provider: Felipa Evener  MRN: [id number] Department: [facility] [facility]             Vital Signs Most recent update: 02/20/2014 12:48 PM by Charlie Pitter, PA-C     BP Pulse Ht Wt BMI    124/68 136 5\' 10"  (1.778 m) 205 lb (92.987 kg) 29.41 kg/m2      Vitals History Recorded                 Progress Notes     Deboraha Sprang, MD at 02/20/2014 12:38 PM     Status: Sign at close encounter         [address]  [place], [place] [address]  Phone: [phone]  Fax: [phone]  Date: 02/20/2014  Patient ID: Donald [name], Donald [name] 12-Oct-1932, MRN [id number]  PCP: [name], MD  Cardiologist: New to Dr. Caryl Comes  History of Present Illness - New Patient:  Donald [name] is an active 80 y.o. male with no prior cardiac history except normal nuc 2011, but a history of HTN, HLD, habitual alcohol use (4 drinks/day), skin cancer, recently abnormal abdominal CT who was added onto my schedule due to newly recognized atrial flutter with 2:1 conduction. He recently saw his PCP for 2-3 episodes of chest discomfort lasting about 10-15 minutes at a time, associated with a sensation of higher heart rate. These have typically occurred at night. 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 Outpatient EKG on 02/03/14 showed NSR 63bpm with brief run of 2 PACs. He was set up for a nuclear stress test today. He underwent resting images. When hooked up to the EKG for the stress images he was noted to be in atrial flutter with 2:1 conduction, HR 142. He had not taken his atenolol since yesterday morning (per our pre-stress test instructions). He was given 25mg  of atenolol after the test and HR came down to 130's. Interestingly he's not really aware of his heart rate right now. He  denies any acute CP, SOB, nausea, weight change, syncope. No h/o bleeding issues, ongoing signs of bleeding or history of TIA/CVA.  He was recently seen by pulm for snoring and referred for a sleep study. He also had abdominal tenderness by PCP visit and CT abdomen/pelvis 02/18/14 showed profound thickening of the fundus and cardia of the stomach, suspicious for potential neoplasm; colonic diverticulosis without diverticulitis (known), liver/kidney cysts, extensive atherosclerosis, including right coronary artery disease. He is awaiting outpatient GI referral.  Recent Labs:  11/20/2013: ALT 17; HDL Cholesterol by NMR 48.90; LDL (calc) 122*; TSH 0.51  02/03/2014: Creatinine 1.0; Hemoglobin 14.2; Potassium 4.7      Wt Readings from Last 3 Encounters:      02/20/14  205 lb (92.987 kg)      02/20/14  205 lb (92.987 kg)      02/10/14  209 lb 3.2 oz (94.892 kg)      Past Medical History      Diagnosis  Date      .  Hypertension       .  Bradycardia       .  ED (erectile dysfunction)       .  Arthropathy, unspecified,  site unspecified       .  Prostatitis, unspecified       .  Personal history of other diseases of digestive system       .  Allergic rhinitis due to pollen       .  Benign neoplasm of colon       .  Diverticulosis       .  Skin cancer         basal and squamous cell      .  Abnormal CT scan, stomach         a. Profound thickening of the fundus and cardia of the stomach, suspicious for potential neoplasm by CT 02/2014. Referred to GI.      Marland Kitchen  Atherosclerosis         a. Noted by abdominal CT 02/2014 (h/o normal nuc 2011).      .  Habitual alcohol use       .  Snoring         a. Pending OSA eval by pulm.      .  Hyperlipidemia       .  Obesity       Current Outpatient Prescriptions      Medication  Sig  Dispense  Refill      .  atorvastatin (LIPITOR) 40 MG tablet  Take 1 tablet (40 mg total) by mouth daily.  90 tablet  3      .  finasteride (PROSCAR) 5 MG tablet  Take 1 tablet (5 mg  total) by mouth daily.  30 tablet  11      .  fluticasone (FLONASE) 50 MCG/ACT nasal spray  USE 1 SPRAY EACH NOSTRIL ONCE DAILY. SHAKE GENTLY  16 g  11      .  furosemide (LASIX) 20 MG tablet  TAKE 1 TABLET ONCE DAILY.  90 tablet  3      .  loratadine (CLARITIN) 10 MG tablet  Take 10 mg by mouth as needed.        Marland Kitchen  losartan (COZAAR) 100 MG tablet  Take 1 tablet (100 mg total) by mouth daily.  90 tablet  3      .  Multiple Vitamins-Minerals (EYE VITAMINS PO)  Take 1 tablet by mouth 2 (two) times daily.        .  Multiple Vitamins-Minerals (MULTIVITAMIN,TX-MINERALS) tablet  Take 1 tablet by mouth daily. Takes but not on regular basis        .  atenolol (TENORMIN) 25 MG tablet  Take 1 tablet (25 mg total) by mouth daily.  30 tablet  0      No current facility-administered medications for this visit.       Allergies: Review of patient's allergies indicates no known allergies.  Social History: The patient reports that he quit smoking about 5 years ago. His smoking use included Cigarettes and Cigars. He smoked 1.00 pack per day. He has never used smokeless tobacco. He reports that he drinks about 10.5 ounces of alcohol per week. He reports that he does not use illicit drugs.  Family History: The patient's family history includes Cancer in his brother and mother; Other in his father; Pulmonary fibrosis in his brother. There is no history of Diabetes or Coronary artery disease.  ROS: Please see the history of present illness. All other systems reviewed and negative.  EKG: Initial EKG: atrial flutter 142bpm with 2:1 conduction with nonspecific ST abnormality  After 25mg  of  atenolol, atrial flutter 136bpm with variable AVB  Signed,  Dayna Dunn, PA-C  02/20/2014 1:32 PM  PHYSICAL EXAM:  VS: BP 124/68  Pulse 136  Ht 5\' 10"  (1.778 m)  Wt 205 lb (92.987 kg)  BMI 29.41 kg/m2  Well nourished, well developed, in no acute distress  HEENT: normal  Neck: no JVD  Cardiac: normal S1, S2; rapid but RRR; no  murmur  Lungs: clear to auscultation bilaterally, no wheezing, rhonchi or rales  Abd: soft, nontender, no hepatomegaly  Ext: no edema  Skin: warm and dry  Neuro: moves all extremities spontaneously, no focal abnormalities noted  ASSESSMENT AND PLAN:  1. Atrial flutter The patient presented with asymptomatic atrial flutter with 2:1 conduction. His CHADS-VASc score prompts the need for anticoagulation. Normally, catheter ablation would be the appropriate next antiarrhythmic intervention; however, he has episodes that awaken him at night and are associated with palpitations and discomfort raising the possibility of paroxysmal atrial fibrillation.  For now, we will work on restoring sinus rhythm. We'll begin him on anticoagulation and anticipate TE guided cardioversion in 72 hours, i.e. on Monday. We will utilize a 30 day event recorder to assess his arrhythmia and is not fibrillation is identified we will proceed with catheter ablation of his flutter substrate.  He will need to subsequent evaluation for his chest pain syndrome and would anticipate Myoview scanning.  He is also noted to have gastric thickening the cause of which is on certain; GI referral has been made. Hopefully we can have him back in sinus rhythm the time for them to do whatever they need to do without the need for anticoagulants concurrently.  Increase atenolol to 50mg  daily  - start Eliquis 5mg  BID  - Update labs to ensure stability, check thyroid function  - 30 day monitor to exclude AF  - TEE/DCCV on Monday  - schedule for ablation 3-4 weeks as primary strategy  - eventual re-nuc  - await GI eval      For TEE DCCV. Kirk Ruths

## 2014-02-24 NOTE — Progress Notes (Signed)
Echocardiogram Echocardiogram Transesophageal has been performed.  Joelene Millin 02/24/2014, 10:46 AM

## 2014-02-25 ENCOUNTER — Ambulatory Visit (INDEPENDENT_AMBULATORY_CARE_PROVIDER_SITE_OTHER): Payer: Medicare Other | Admitting: Internal Medicine

## 2014-02-25 ENCOUNTER — Encounter (HOSPITAL_COMMUNITY): Payer: Self-pay | Admitting: Cardiology

## 2014-02-25 VITALS — BP 106/62 | HR 51 | Ht 66.75 in | Wt 212.2 lb

## 2014-02-25 DIAGNOSIS — R1013 Epigastric pain: Secondary | ICD-10-CM

## 2014-02-25 DIAGNOSIS — R933 Abnormal findings on diagnostic imaging of other parts of digestive tract: Secondary | ICD-10-CM

## 2014-02-25 NOTE — Progress Notes (Signed)
Patient ID: Donald [name], male   DOB: 20-Jul-1932, 80 y.o.   MRN: [id number]     History of Present Illness: Donald [name] is a 80 year old male referred for evaluation of abnormal findings on abdominal CAT scan. The patient was seen by his PCP in September with complaints of epigastric pain of 2-4 weeks duration. the pain is described as a "discomfort" and it is fairly constant. It is not alleviated or exacerbated with ingestion of food. It is not alleviated with defecation or passage of gas. There is no associated nausea or vomiting. There is no early satiety. His appetite has been good and his weight has been stable.He was  sent for an abdominal CT which he had performed on October 6. He was noted to have profound thickening of the fundus and cardia of the stomach, suspicious for potential neoplasm. He was also noted to have multiple low attenuation lesions in the liver and right kidney. These were felt to favor cystic lesions.  Donald [name] has a history of hypertension, HLT, habitual alcohol use consisting of approximately 4 drinks daily, and skin cancer. Recently he saw his primary care provider for several at the episodes of chest discomfort that lasted about 10-15 minutes at a time and were associated with a sensation of his heart racing. These episodes typically occurred  at night. Outpatient EKG on September 21 showed normal sinus rhythm with 63 beats per minute with brief run of 2 PACs. He subsequently had a nuclear stress test. When the EKG monitor was hooked up for stress images he was noted to be in atrial flutter with 2-1 conduction, heart rate 142. He underwent a TEE with cardioversion yesterday and has been started on Eliquis 5 mg twice daily. He says he has not yet scheduled his followup with Dr. Stanford Breed his cardiologist.  Past Medical History  Diagnosis Date  . Hypertension   . Bradycardia   . ED (erectile dysfunction)   . Arthropathy, unspecified, site unspecified   . Prostatitis,  unspecified   . Personal history of other diseases of digestive system   . Allergic rhinitis due to pollen   . Benign neoplasm of colon   . Diverticulosis   . Skin cancer     basal and squamous cell  . Abnormal CT scan, stomach     a. Profound thickening of the fundus and cardia of the stomach, suspicious for potential neoplasm by CT 02/2014. Referred to GI.  Marland Kitchen Atherosclerosis     a. Noted by abdominal CT 02/2014 (h/o normal nuc 2011).  . Habitual alcohol use   . Snoring     a. Pending OSA eval by pulm.  . Hyperlipidemia   . Obesity   . Shortness of breath 12/2013  . Atrial fibrillation and flutter 02/24/14    Past Surgical History  Procedure Laterality Date  . Inguinal hernia repair      right  . Inguinal hernia repair      left  . Tonsillectomy    . Orif fracture of the elbow    . Squamous cell carcinoma excision    . Skin cancer excision  05/21/12    Squamous cell ca  . Hernia repair    . Tee without cardioversion N/A 02/24/2014    Procedure: TRANSESOPHAGEAL ECHOCARDIOGRAM (TEE);  Surgeon: Lelon Perla, MD;  Location: [facility];  Service: Cardiovascular;  Laterality: N/A;  . Cardioversion N/A 02/24/2014    Procedure: CARDIOVERSION;  Surgeon: Lelon Perla, MD;  Location: [facility];  Service: Cardiovascular;  Laterality: N/A;   Family History  Problem Relation Age of Onset  . Cancer Mother     ovarian  . Other Father     renal disease- grief over loss of spouse  . Cancer Brother     prostate- died of hematologic disorder 2nd to chemo  . Diabetes Neg Hx   . Coronary artery disease Neg Hx   . Pulmonary fibrosis Brother    History  Substance Use Topics  . Smoking status: Former Smoker -- 1.00 packs/day    Types: Cigarettes, Cigars    Quit date: 09/18/2008  . Smokeless tobacco: Never Used     Comment: Has Occasional Cigar  . Alcohol Use: 10.5 oz/week    21 drink(s) per week     Comment: 1 cocktail and 2-3 glasses of wine per night   Current  Outpatient Prescriptions  Medication Sig Dispense Refill  . apixaban (ELIQUIS) 5 MG TABS tablet Take 1 tablet (5 mg total) by mouth 2 (two) times daily.  60 tablet  6  . atenolol (TENORMIN) 25 MG tablet Take 2 tablets (50 mg total) by mouth daily.  60 tablet  1  . atorvastatin (LIPITOR) 40 MG tablet Take 1 tablet (40 mg total) by mouth daily.  90 tablet  3  . finasteride (PROSCAR) 5 MG tablet Take 1 tablet (5 mg total) by mouth daily.  30 tablet  11  . fluticasone (FLONASE) 50 MCG/ACT nasal spray Place 1 spray into both nostrils daily as needed for allergies.      . furosemide (LASIX) 20 MG tablet Take 20 mg by mouth daily.      Marland Kitchen loratadine (CLARITIN) 10 MG tablet Take 10 mg by mouth daily.       Marland Kitchen losartan (COZAAR) 100 MG tablet Take 1 tablet (100 mg total) by mouth daily.  90 tablet  3  . Multiple Vitamins-Minerals (EYE VITAMINS PO) Take 1 tablet by mouth 2 (two) times daily.      . Multiple Vitamins-Minerals (MULTIVITAMIN,TX-MINERALS) tablet Take 1 tablet by mouth 2 (two) times daily.        No current facility-administered medications for this visit.   No Known Allergies    Review of Systems: Gen: Denies any fever, chills, sweats, anorexia, fatigue, weakness, malaise, weight loss, and sleep disorder CV: Denies chest pain, angina, palpitations, syncope, orthopnea, PND, peripheral edema, and claudication. Resp: Denies dyspnea at rest, dyspnea with exercise, cough, sputum, wheezing, coughing up blood, and pleurisy. GI: Denies vomiting blood, jaundice, and fecal incontinence.   Denies dysphagia or odynophagia. GU : Denies urinary burning, blood in urine, urinary frequency, urinary hesitancy, nocturnal urination, and urinary incontinence. MS: Denies joint pain, limitation of movement, and swelling, stiffness, low back pain, extremity pain. Denies muscle weakness, cramps, atrophy.  Derm: Denies rash, itching, dry skin, hives, moles, warts, or unhealing ulcers.  Psych: Denies depression,  anxiety, memory loss, suicidal ideation, hallucinations, paranoia, and confusion. Heme: Denies bruising, bleeding, and enlarged lymph nodes. Neuro:  Denies any headaches, dizziness, paresthesia Endo:  Denies any problems with DM, thyroid, adrenal  Labs: CBC on 02/20/14  Wbc 10.7, Hgb 15.5, Hct 47.1, plt 231  Studies:   Ct Abdomen Pelvis Wo Contrast  02/18/2014   CLINICAL DATA:  Initial evaluation of a 80 year old male with generalized abdominal pain over the prior 2-3 weeks.  EXAM: CT ABDOMEN AND PELVIS WITHOUT CONTRAST  TECHNIQUE: Multidetector CT imaging of the abdomen and pelvis was performed following the standard protocol without IV contrast.  COMPARISON:  No priors.  FINDINGS: Lower chest: Small hiatal hernia. Calcifications of the aortic valve and inferior mitral annulus. Atherosclerotic calcifications in the right coronary artery.  Hepatobiliary: Multiple well-defined low-attenuation liver lesions are incompletely characterized on today's non contrast CT examination. The largest of these lesions include a 18 x 15 mm lesion in segment 8 and a 20 x 14 mm lesion in segment 6. The unenhanced appearance of the gallbladder is unremarkable.  Pancreas: Unremarkable.  Spleen: Unremarkable.  Adrenals/Urinary Tract: Normal adrenal glands bilaterally. Two right-sided renal lesions are low-attenuation, and although incompletely characterize are favored to represent small cysts, largest of which measures 2.7 x 2.6 cm in the upper pole the right kidney. There is an exophytic 7 mm intermediate attenuation (31 HU) in the upper pole of the left kidney, also incompletely characterized, but favored to represent a small proteinaceous cyst. No hydroureteronephrosis. Urinary bladder is unremarkable in appearance on today's non contrast CT examination.  Stomach/Bowel: Profound thickening of the fundus and cardia of the stomach immediately adjacent to the gastroesophageal junction where there is a mass like appearance with  the wall measuring up to 3 cm in thickness (image 15 of series 3). No surrounding lymphadenopathy. No pathologic dilatation of the small bowel or colon. There are numerous colonic diverticulae, particularly in the sigmoid colon, without surrounding inflammatory changes to suggest an acute diverticulitis at this time. Normal appendix.  Vascular/Lymphatic: Extensive atherosclerosis throughout the abdominal and pelvic vasculature, without definite aneurysm. No definite pathologically enlarged lymph nodes are noted in the abdomen or pelvis on today's non contrast CT examination.  Reproductive: Prostate gland is unremarkable in appearance.  Other: Small left inguinal hernia containing only fat. Postoperative changes in the left inguinal region suggesting prior herniorrhaphy. No significant volume of ascites. No pneumoperitoneum.  Musculoskeletal: There are no aggressive appearing lytic or blastic lesions noted in the visualized portions of the skeleton. Old compression fracture of T12 with approximately 20% loss of anterior vertebral body height is unchanged compared to prior chest CT 05/07/2013.  IMPRESSION: 1. Profound thickening of the fundus and cardia of the stomach, suspicious for potential neoplasm. Correlation with nonemergent endoscopy is recommended in the near future for direct inspection and potential biopsy. 2. Colonic diverticulosis without findings to suggest acute diverticulitis at this time. 3. Multiple low-attenuation lesions in the liver and right kidney are incompletely characterized on today's non contrast CT examination, however, these are favored to represent cysts. There is also a small exophytic intermediate attenuation lesion in the upper pole of the left kidney measuring 7 mm, likely to represent a proteinaceous cyst. 4. Findings suggestive of prior left inguinal herniorrhaphy. Despite this, there appears to be a recurrent small left inguinal hernia containing only fat. 5. Extensive  atherosclerosis, including right coronary artery disease. 6. Additional incidental findings, as above.   Electronically Signed   By: Vinnie Langton M.D.   On: 02/18/2014 17:23     Physical Exam: General: Pleasant, well developed , well nourished,male in no acute distress Head: Normocephalic and atraumatic Eyes:  sclerae anicteric, conjunctiva pink  Ears: Normal auditory acuity Lungs: Clear throughout to auscultation Heart:irregular rate Abdomen: Soft, non distended,mild epigastric tenderness. No masses, no hepatomegaly. Normal bowel sounds Rectal: deferred Musculoskeletal: Symmetrical with no gross deformities  Extremities: No edema  Neurological: Alert oriented x 4, grossly nonfocal Psychological:  Alert and cooperative. Normal mood and affect  Assessment and Recommendations: A 80 year old male recently found to have thickening of the fundus and cardia of stomach on CT  scan obtained as part of a workup for abdominal pain. He has also been noted to have multiple low-attenuation lesions in the liver. The patient recently underwent cardioversion for atrial flutter yesterday and has been started on Eliquis 5 mg twice a day. The patient is to be scheduled for an EGD with possible biopsies for further evaluation of the area of thickening seen on CT scan.  The risks, benefits, and alternatives to endoscopy with possible biopsy and possible dilation were discussed with the patient and they consent to proceed. The risk of holding anticoagulation therapy or antiplatelet medications was discussed including the increased risk for thromboembolic disease that may include DVT, pulmonary emboli and stroke. The patient understands this risk and is willing to proceed with temporally holding the medication provided that this is approved by her PCP or cardiologist.If the EGD is negative for malignancy, MRI to further evaluate the low-attenuation lesions in the liver will be scheduled.  Addendum: Reviewed and  agree with initial management. I will discuss holding anti-thrombotic therapy with Dr. Stanford Breed, his cardiologist. Ideally the medication could be held so that biopsies can be performed at the time of his upper endoscopy. If he will need Eliquis for a long period of time without interruption, we can consider EGD diagnostic only. Jerene Bears, MD

## 2014-02-25 NOTE — Patient Instructions (Signed)
We will contact you about scheduling your Endoscopy once Dr Hilarie Fredrickson discusses with Dr Stanford Breed

## 2014-03-03 NOTE — Progress Notes (Signed)
Yes okay for [facility] on 04/08/2014. Exam needs to be greater than 28 days after cardioversion with hold on Eliquis for 48 hrs before EGD JMP ----- Message ----- From: Maury Dus, RN Sent: 02/26/2014 3:30 PM To: Jerene Bears, MD Dr. Hilarie Fredrickson, You are hospital doc the 1st week of Nov, gone the second week and the 1st available in the [place] I find is 11/24. Are you ok with that? Dag ----- Message ----- From: Jerene Bears, MD Sent: 02/26/2014 3:18 PM To: Maury Dus, RN, Cecille Rubin P Hvozdovic, PA-C Pt needs to wait 4 weeks from date of cardioversion (this was 02/24/2014), and then proceed to EGD to evaluate abnormal GI imaging. Can make patient aware that this is the recommendation from Dr. Stanford Breed and he has forwarded to his primary cardiologist, Dr. Caryl Comes. Klein's office will contact him for followup. Eliquis can be held for 2 days prior to EGD. Okay for EGD in [place] ----- Message ----- From: Lelon Perla, MD Sent: 02/26/2014 7:21 AM To: Deboraha Sprang, MD, Jerene Bears, MD Anticoagulation should not be interrupted for 4 weeks following cardioversion; he is followed by Jolyn Nap; I will also forward to him; his nurse will contact patient for fu appt with Richardson Landry. Kirk Ruths ----- Message ----- From: Jerene Bears, MD Sent: 02/25/2014 8:48 PM To: Lelon Perla, MD Aaron Edelman, I saw Mr. Basley today in clinic regarding abnormal CT scan of the stomach raising the question of cancer. He needs an EGD to evaluate this further. He had TEE cardioversion yesterday with you and is on Eliquis. When do you think it will be safe for him to interrupt Eliquis to allow for EGD with biopsy. Ideally I would have him hold the med for 48 hrs before EGD. Thanks for your help. Also, he asked me to let you know he is waiting to hear about office followup with your office after cardioversion. Thanks for your help. Ulice Dash Pyrtle   Pt scheduled for previsit 03/31/14@3 :30pm and EGD In the [facility] 04/08/14@4pm . Pt aware and knows to stop  eliquis 48 hours prior to EGD.

## 2014-03-05 ENCOUNTER — Ambulatory Visit (INDEPENDENT_AMBULATORY_CARE_PROVIDER_SITE_OTHER): Payer: Medicare Other | Admitting: Internal Medicine

## 2014-03-05 ENCOUNTER — Other Ambulatory Visit: Payer: Self-pay | Admitting: *Deleted

## 2014-03-05 ENCOUNTER — Encounter: Payer: Self-pay | Admitting: Internal Medicine

## 2014-03-05 VITALS — BP 112/72 | HR 46 | Ht 67.0 in | Wt 213.0 lb

## 2014-03-05 DIAGNOSIS — Z0000 Encounter for general adult medical examination without abnormal findings: Secondary | ICD-10-CM

## 2014-03-05 DIAGNOSIS — I420 Dilated cardiomyopathy: Secondary | ICD-10-CM

## 2014-03-05 DIAGNOSIS — I4892 Unspecified atrial flutter: Secondary | ICD-10-CM

## 2014-03-05 DIAGNOSIS — I5020 Unspecified systolic (congestive) heart failure: Secondary | ICD-10-CM

## 2014-03-05 DIAGNOSIS — Q231 Congenital insufficiency of aortic valve: Secondary | ICD-10-CM

## 2014-03-05 DIAGNOSIS — R001 Bradycardia, unspecified: Secondary | ICD-10-CM

## 2014-03-05 DIAGNOSIS — Z01812 Encounter for preprocedural laboratory examination: Secondary | ICD-10-CM

## 2014-03-05 NOTE — Progress Notes (Signed)
ELECTROPHYSIOLOGY CONSULT NOTE  Patient ID: Donald [name], MRN: [id number], DOB/AGE: 08/18/1932 80 y.o. Admit date: (Not on file) Date of Consult: 03/05/2014  Primary Physician: [name], MD Primary Cardiologist: new  Chief Complaint:  Atrial flutter    HPI Donald [name] is a 80 y.o. male  Identified with atrial flutter and 2:1 conduction when he presented 10/15 for stress test scheduled by PCP for episodic chest pains  He was also struggling with some DOE  He was thus admitted for [facility] and was found at TEE to have significant non quantified cardiomyopathy and bicuspid aortic valve-- it should be noted that AoValve and RCA calcification had been noted on previous ECG  He was discharged on uptitrated dose of atenolol 25-50 and has had some complaints of ongoing DOE; his HR on ECG is 42   He has not had any edema  TERF include age-25, HTN-1 and cardiomyopathy-1  All of this occurs in the context of anormal thickening of the fundus of the stomach concerning for neoplasm for which he is scheduled to undergo endoscopy later in November      Past Medical History  Diagnosis Date  . Hypertension   . Bradycardia   . ED (erectile dysfunction)   . Arthropathy, unspecified, site unspecified   . Prostatitis, unspecified   . Personal history of other diseases of digestive system   . Allergic rhinitis due to pollen   . Benign neoplasm of colon   . Diverticulosis   . Skin cancer     basal and squamous cell  . Abnormal CT scan, stomach     a. Profound thickening of the fundus and cardia of the stomach, suspicious for potential neoplasm by CT 02/2014. Referred to GI.  Marland Kitchen Atherosclerosis     a. Noted by abdominal CT 02/2014 (h/o normal nuc 2011).  . Habitual alcohol use   . Snoring     a. Pending OSA eval by pulm.  . Hyperlipidemia   . Obesity   . Shortness of breath 12/2013  . Atrial fibrillation and flutter 02/24/14      Surgical History:  Past Surgical History    Procedure Laterality Date  . Inguinal hernia repair      right  . Inguinal hernia repair      left  . Tonsillectomy    . Orif fracture of the elbow    . Squamous cell carcinoma excision    . Skin cancer excision  05/21/12    Squamous cell ca  . Hernia repair    . Tee without cardioversion N/A 02/24/2014    Procedure: TRANSESOPHAGEAL ECHOCARDIOGRAM (TEE);  Surgeon: Lelon Perla, MD;  Location: [facility] ENDOSCOPY;  Service: Cardiovascular;  Laterality: N/A;  . Cardioversion N/A 02/24/2014    Procedure: CARDIOVERSION;  Surgeon: Lelon Perla, MD;  Location: [facility] ENDOSCOPY;  Service: Cardiovascular;  Laterality: N/A;     Home Meds: Prior to Admission medications   Medication Sig Start Date End Date Taking? Authorizing Provider  apixaban (ELIQUIS) 5 MG TABS tablet Take 1 tablet (5 mg total) by mouth 2 (two) times daily. 02/20/14  Yes Deboraha Sprang, MD  atenolol (TENORMIN) 25 MG tablet Take 2 tablets (50 mg total) by mouth daily. 02/20/14  Yes Dayna N Dunn, PA-C  atorvastatin (LIPITOR) 40 MG tablet Take 1 tablet (40 mg total) by mouth daily. 02/03/14  Yes Janith Lima, MD  finasteride (PROSCAR) 5 MG tablet Take 1 tablet (5 mg total) by mouth daily. 01/13/14  Yes Janith Lima, MD  fluticasone (FLONASE) 50 MCG/ACT nasal spray Place 1 spray into both nostrils daily as needed for allergies.   Yes Historical Provider, MD  furosemide (LASIX) 20 MG tablet Take 20 mg by mouth daily.   Yes Historical Provider, MD  loratadine (CLARITIN) 10 MG tablet Take 10 mg by mouth daily.    Yes Historical Provider, MD  losartan (COZAAR) 100 MG tablet Take 1 tablet (100 mg total) by mouth daily. 05/07/13  Yes Neena Rhymes, MD  Multiple Vitamins-Minerals (EYE VITAMINS PO) Take 1 tablet by mouth 2 (two) times daily.   Yes Historical Provider, MD  Multiple Vitamins-Minerals (MULTIVITAMIN,TX-MINERALS) tablet Take 1 tablet by mouth 2 (two) times daily.    Yes Historical Provider, MD     Allergies: No Known  Allergies  History   Social History  . Marital Status: Married    Spouse Name: N/A    Number of Children: 3  . Years of Education: 16   Occupational History  . Land    Social History Main Topics  . Smoking status: Former Smoker -- 1.00 packs/day    Types: Cigarettes, Cigars    Quit date: 09/18/2008  . Smokeless tobacco: Never Used     Comment: Has Occasional Cigar  . Alcohol Use: 10.5 oz/week    21 drink(s) per week     Comment: 1 cocktail and 2-3 glasses of wine per night  . Drug Use: No  . Sexual Activity: Not Currently   Other Topics Concern  . 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 married 1961. 2 sons- '64, '63 , I daughter- '66, 4 grandchildren. work: Museum/gallery curator, retired but still consults. 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     Family History  Problem Relation Age of Onset  . Cancer Mother     ovarian  . Other Father     renal disease- grief over loss of spouse  . Cancer Brother     prostate- died of hematologic disorder 2nd to chemo  . Diabetes Neg Hx   . Coronary artery disease Neg Hx   . Pulmonary fibrosis Brother      ROS:  Please see the history of present illness.     All other systems reviewed and negative.    Physical Exam:   Blood pressure 112/72, pulse 46, height 5\' 7"  (1.702 m), weight 213 lb (96.616 kg). General: Well developed, well nourished male in no acute distress. Head: Normocephalic, atraumatic, sclera non-icteric, no xanthomas, nares are without discharge. EENT: normal Lymph Nodes:  none Back: without scoliosis/kyphosis , no CVA tendersness Neck: L>R carotid bruits. JVD 8 Lungs: Clear bilaterally to auscultation without wheezes, rales, or rhonchi. Breathing is unlabored. Heart: RRR with S1 S2. 2/6  murmur , rubs, or gallops appreciated. Abdomen: Soft, non-tender, non-distended with  normoactive bowel sounds. No hepatomegaly. No rebound/guarding. No obvious abdominal masses. Msk:  Strength and tone appear normal for age. Extremities: No clubbing or cyanosis. No  edema.  Distal pedal pulses are 2+ and equal bilaterally. Skin: Warm and Dry Neuro: Alert and oriented X 3. CN III-XII intact Grossly normal sensory and motor function . Psych:  Responds to questions appropriately with a normal affect.      Labs: Cardiac Enzymes No results found for this basename: CKTOTAL, CKMB, TROPONINI,  in the last 72 hours CBC Lab Results  Component Value Date   WBC 10.7* 02/20/2014   HGB 15.5 02/20/2014   HCT 47.1 02/20/2014   MCV 100.3* 02/20/2014   PLT 231.0 02/20/2014   PROTIME: No results found for this basename: LABPROT, INR,  in the last 72 hours Chemistry No results found for this basename: NA, K, CL, CO2, BUN, CREATININE, CALCIUM, LABALBU, PROT, BILITOT, ALKPHOS, ALT, AST, GLUCOSE,  in the last 168 hours Lipids Lab Results  Component Value Date   CHOL 190 11/20/2013   HDL 48.90 11/20/2013   LDLCALC 122* 11/20/2013   TRIG 96.0 11/20/2013   BNP No results found for this basename: probnp   Miscellaneous No results found for this basename: DDIMER    Radiology/Studies:  Ct Abdomen Pelvis Wo Contrast  02/18/2014   CLINICAL DATA:  Initial evaluation of a 80 year old male with generalized abdominal pain over the prior 2-3 weeks.  EXAM: CT ABDOMEN AND PELVIS WITHOUT CONTRAST  TECHNIQUE: Multidetector CT imaging of the abdomen and pelvis was performed following the standard protocol without IV contrast.  COMPARISON:  No priors.  FINDINGS: Lower chest: Small hiatal hernia. Calcifications of the aortic valve and inferior mitral annulus. Atherosclerotic calcifications in the right coronary artery.  Hepatobiliary: Multiple well-defined low-attenuation liver lesions are incompletely characterized on today's non contrast CT examination. The largest of these lesions include a 18 x 15 mm lesion in  segment 8 and a 20 x 14 mm lesion in segment 6. The unenhanced appearance of the gallbladder is unremarkable.  Pancreas: Unremarkable.  Spleen: Unremarkable.  Adrenals/Urinary Tract: Normal adrenal glands bilaterally. Two right-sided renal lesions are low-attenuation, and although incompletely characterize are favored to represent small cysts, largest of which measures 2.7 x 2.6 cm in the upper pole the right kidney. There is an exophytic 7 mm intermediate attenuation (31 HU) in the upper pole of the left kidney, also incompletely characterized, but favored to represent a small proteinaceous cyst. No hydroureteronephrosis. Urinary bladder is unremarkable in appearance on today's non contrast CT examination.  Stomach/Bowel: Profound thickening of the fundus and cardia of the stomach immediately adjacent to the gastroesophageal junction where there is a mass like appearance with the wall measuring up to 3 cm in thickness (image 15 of series 3). No surrounding lymphadenopathy. No pathologic dilatation of the small bowel or colon. There are numerous colonic diverticulae, particularly in the sigmoid colon, without surrounding inflammatory changes to suggest an acute diverticulitis at this time. Normal appendix.  Vascular/Lymphatic: Extensive atherosclerosis throughout the abdominal and pelvic vasculature, without definite aneurysm. No definite pathologically enlarged lymph nodes are noted in the abdomen or pelvis on today's non contrast CT examination.  Reproductive: Prostate gland is unremarkable in appearance.  Other: Small left inguinal hernia containing only fat. Postoperative changes in the left inguinal region suggesting prior herniorrhaphy. No significant volume of ascites. No pneumoperitoneum.  Musculoskeletal: There are no aggressive appearing lytic or blastic lesions noted in the visualized portions of the skeleton. Old compression fracture of T12 with approximately 20% loss of anterior vertebral body height is  unchanged compared to prior chest CT 05/07/2013.  IMPRESSION: 1. Profound thickening of the fundus and cardia of the stomach, suspicious for potential neoplasm. Correlation with nonemergent endoscopy is recommended in the near future for direct inspection and potential biopsy. 2. Colonic diverticulosis without findings to suggest acute diverticulitis at this time. 3. Multiple low-attenuation lesions in the liver and right kidney are incompletely characterized on today's non contrast CT examination, however, these are favored to represent cysts. There is  also a small exophytic intermediate attenuation lesion in the upper pole of the left kidney measuring 7 mm, likely to represent a proteinaceous cyst. 4. Findings suggestive of prior left inguinal herniorrhaphy. Despite this, there appears to be a recurrent small left inguinal hernia containing only fat. 5. Extensive atherosclerosis, including right coronary artery disease. 6. Additional incidental findings, as above.   Electronically Signed   By: Vinnie Langton M.D.   On: 02/18/2014 17:23    EKG:  Sinsu 46    Assessment and Plan:    atrial flutter with a rapid ventricular rate resolved   cardiomyopathy question mechanism question rate related    aortic valve-bicuspid   sinus bradycardia  Congestive heart failure   The pt presented with atrial flutter with RVR which has been cardioverted. Hopefully this is the cause of his cardiomyopathy and we will be able soon to see interval improvement.  In the event that there is none, he will need investigation as to the cause of his cardiomyopathy, prob in the form of cath.  He will also need medical therapy directed at this  He was also noted to have a bicuspid aortic valve and will need quantitation;  I will arrange followup with Dr London Sheer re his valve  For now the issue is atrial flutter and risk reduction re TE and getting him off his anticoagulation in anticipation for his endoscopy and biopsy  We  have reviewed benefits and risks incl but not limited to perforation, heart block and vascular injury.  He understands agrees and is willing to proceed.    We will undertake echo and then decide re proceeding with flutter ablation    Virl Axe

## 2014-03-05 NOTE — Patient Instructions (Signed)
Your physician recommends that you continue on your current medications as directed. Please refer to the Current Medication list given to you today. Your physician has requested that you have an echocardiogram. Echocardiography is a painless test that uses sound waves to create images of your heart. It provides your doctor with information about the size and shape of your heart and how well your heart's chambers and valves are working. This procedure takes approximately one hour. There are no restrictions for this procedure.  Your physician has recommended that you have an ablation. Catheter ablation is a medical procedure used to treat some cardiac arrhythmias (irregular heartbeats). During catheter ablation, a long, thin, flexible tube is put into a blood vessel in your groin (upper thigh), or neck. This tube is called an ablation catheter. It is then guided to your heart through the blood vessel. Radio frequency waves destroy small areas of heart tissue where abnormal heartbeats may cause an arrhythmia to start. Please see the instruction sheet given to you today.

## 2014-03-06 ENCOUNTER — Encounter (HOSPITAL_COMMUNITY): Payer: Self-pay | Admitting: Pharmacy Technician

## 2014-03-10 ENCOUNTER — Ambulatory Visit (HOSPITAL_COMMUNITY): Payer: Medicare Other | Attending: Cardiovascular Disease | Admitting: Radiology

## 2014-03-10 ENCOUNTER — Institutional Professional Consult (permissible substitution): Payer: Medicare Other | Admitting: Internal Medicine

## 2014-03-10 ENCOUNTER — Other Ambulatory Visit (INDEPENDENT_AMBULATORY_CARE_PROVIDER_SITE_OTHER): Payer: Medicare Other | Admitting: *Deleted

## 2014-03-10 DIAGNOSIS — I10 Essential (primary) hypertension: Secondary | ICD-10-CM | POA: Diagnosis not present

## 2014-03-10 DIAGNOSIS — Z87891 Personal history of nicotine dependence: Secondary | ICD-10-CM | POA: Insufficient documentation

## 2014-03-10 DIAGNOSIS — F1010 Alcohol abuse, uncomplicated: Secondary | ICD-10-CM | POA: Diagnosis not present

## 2014-03-10 DIAGNOSIS — I4892 Unspecified atrial flutter: Secondary | ICD-10-CM

## 2014-03-10 DIAGNOSIS — Z01812 Encounter for preprocedural laboratory examination: Secondary | ICD-10-CM

## 2014-03-10 LAB — CBC WITH DIFFERENTIAL/PLATELET
BASOS PCT: 0.4 % (ref 0.0–3.0)
Basophils Absolute: 0 10*3/uL (ref 0.0–0.1)
EOS PCT: 3.6 % (ref 0.0–5.0)
Eosinophils Absolute: 0.3 10*3/uL (ref 0.0–0.7)
HCT: 40.6 % (ref 39.0–52.0)
HEMOGLOBIN: 13.5 g/dL (ref 13.0–17.0)
LYMPHS PCT: 23.6 % (ref 12.0–46.0)
Lymphs Abs: 1.9 10*3/uL (ref 0.7–4.0)
MCHC: 33.3 g/dL (ref 30.0–36.0)
MCV: 98.5 fl (ref 78.0–100.0)
MONOS PCT: 9 % (ref 3.0–12.0)
Monocytes Absolute: 0.7 10*3/uL (ref 0.1–1.0)
NEUTROS ABS: 5.2 10*3/uL (ref 1.4–7.7)
NEUTROS PCT: 63.4 % (ref 43.0–77.0)
Platelets: 154 10*3/uL (ref 150.0–400.0)
RBC: 4.12 Mil/uL — AB (ref 4.22–5.81)
RDW: 13.4 % (ref 11.5–15.5)
WBC: 8.2 10*3/uL (ref 4.0–10.5)

## 2014-03-10 LAB — BASIC METABOLIC PANEL
BUN: 22 mg/dL (ref 6–23)
CALCIUM: 8.8 mg/dL (ref 8.4–10.5)
CO2: 26 mEq/L (ref 19–32)
CREATININE: 1 mg/dL (ref 0.4–1.5)
Chloride: 106 mEq/L (ref 96–112)
GFR: 75.71 mL/min (ref >60.00)
Glucose, Bld: 84 mg/dL (ref 70–99)
Potassium: 4.5 mEq/L (ref 3.5–5.1)
SODIUM: 138 meq/L (ref 135–145)

## 2014-03-10 NOTE — Progress Notes (Signed)
Echocardiogram performed.  

## 2014-03-16 DIAGNOSIS — K219 Gastro-esophageal reflux disease without esophagitis: Secondary | ICD-10-CM

## 2014-03-16 DIAGNOSIS — K222 Esophageal obstruction: Secondary | ICD-10-CM

## 2014-03-16 HISTORY — DX: Gastro-esophageal reflux disease without esophagitis: K21.9

## 2014-03-16 HISTORY — DX: Esophageal obstruction: K22.2

## 2014-03-18 ENCOUNTER — Encounter: Payer: Self-pay | Admitting: Internal Medicine

## 2014-03-18 ENCOUNTER — Encounter: Payer: Self-pay | Admitting: *Deleted

## 2014-03-18 ENCOUNTER — Telehealth: Payer: Self-pay | Admitting: Internal Medicine

## 2014-03-18 NOTE — Telephone Encounter (Signed)
New problem   Pt need to know if his ablation has been scheduled b/c he haven't heard from anyone.

## 2014-03-18 NOTE — Telephone Encounter (Signed)
Apologized for no one contacting him. Informed him that I was out of the office last week due to family emergency. He agreed to stop by this afternoon for EKG. Letter of instructions typed and will be given to him when he comes in for EKG. Patient verbalized understanding and agreeable to plan.

## 2014-03-18 NOTE — Telephone Encounter (Signed)
Patient stopped by 1 hour ago. EKG showed sinus brady. reviewed with Dr. Caryl Comes. Patient instructed to hold Eliquis tonight and tomorrow morning. Patient verbalized understanding and agreeable to plan.  Letter of instructions reviewed with patient and given to him to take home.

## 2014-03-19 ENCOUNTER — Ambulatory Visit (HOSPITAL_COMMUNITY)
Admission: RE | Admit: 2014-03-19 | Discharge: 2014-03-20 | Disposition: A | Discharge status: Home / Self Care | Payer: Medicare Other | Source: Ambulatory Visit | Attending: Internal Medicine | Admitting: Internal Medicine

## 2014-03-19 ENCOUNTER — Encounter (HOSPITAL_COMMUNITY)
Admission: RE | Disposition: A | Discharge status: Home / Self Care | Payer: Self-pay | Source: Ambulatory Visit | Attending: Internal Medicine

## 2014-03-19 ENCOUNTER — Encounter (HOSPITAL_COMMUNITY): Payer: Self-pay | Admitting: General Practice

## 2014-03-19 DIAGNOSIS — Z7952 Long term (current) use of systemic steroids: Secondary | ICD-10-CM | POA: Insufficient documentation

## 2014-03-19 DIAGNOSIS — Z6833 Body mass index (BMI) 33.0-33.9, adult: Secondary | ICD-10-CM | POA: Insufficient documentation

## 2014-03-19 DIAGNOSIS — I429 Cardiomyopathy, unspecified: Secondary | ICD-10-CM | POA: Insufficient documentation

## 2014-03-19 DIAGNOSIS — E669 Obesity, unspecified: Secondary | ICD-10-CM | POA: Insufficient documentation

## 2014-03-19 DIAGNOSIS — I4892 Unspecified atrial flutter: Secondary | ICD-10-CM

## 2014-03-19 DIAGNOSIS — Q231 Congenital insufficiency of aortic valve: Secondary | ICD-10-CM | POA: Insufficient documentation

## 2014-03-19 DIAGNOSIS — Z87891 Personal history of nicotine dependence: Secondary | ICD-10-CM | POA: Diagnosis not present

## 2014-03-19 DIAGNOSIS — I10 Essential (primary) hypertension: Secondary | ICD-10-CM | POA: Diagnosis not present

## 2014-03-19 DIAGNOSIS — Z79899 Other long term (current) drug therapy: Secondary | ICD-10-CM | POA: Insufficient documentation

## 2014-03-19 DIAGNOSIS — I509 Heart failure, unspecified: Secondary | ICD-10-CM | POA: Insufficient documentation

## 2014-03-19 DIAGNOSIS — Z7901 Long term (current) use of anticoagulants: Secondary | ICD-10-CM | POA: Diagnosis not present

## 2014-03-19 HISTORY — DX: Bradycardia, unspecified: R00.1

## 2014-03-19 HISTORY — PX: ATRIAL FLUTTER ABLATION: SHX5733

## 2014-03-19 HISTORY — DX: Unspecified atrial flutter: I48.92

## 2014-03-19 HISTORY — DX: Gastro-esophageal reflux disease without esophagitis: K21.9

## 2014-03-19 SURGERY — ATRIAL FLUTTER ABLATION
Anesthesia: LOCAL

## 2014-03-19 MED ORDER — SODIUM CHLORIDE 0.9 % IJ SOLN: 3.0000 mL | INTRAMUSCULAR | Status: DC | PRN

## 2014-03-19 MED ORDER — LOSARTAN POTASSIUM 50 MG PO TABS
100.0000 mg | ORAL_TABLET | Freq: Every day | ORAL | Status: DC
  Administered 2014-03-19: 100 mg via ORAL
  Filled 2014-03-19 (×2): qty 2

## 2014-03-19 MED ORDER — FUROSEMIDE 20 MG PO TABS
20.0000 mg | ORAL_TABLET | Freq: Every day | ORAL | Status: DC
  Filled 2014-03-19: qty 1

## 2014-03-19 MED ORDER — BUPIVACAINE HCL (PF) 0.25 % IJ SOLN
INTRAMUSCULAR | Status: AC
  Filled 2014-03-19: qty 30

## 2014-03-19 MED ORDER — MIDAZOLAM HCL 5 MG/5ML IJ SOLN
INTRAMUSCULAR | Status: AC
  Filled 2014-03-19: qty 5

## 2014-03-19 MED ORDER — FENTANYL CITRATE 0.05 MG/ML IJ SOLN
INTRAMUSCULAR | Status: AC
  Filled 2014-03-19: qty 2

## 2014-03-19 MED ORDER — FLUTICASONE PROPIONATE 50 MCG/ACT NA SUSP: 1.0000 | Freq: Every day | NASAL | Status: DC | PRN

## 2014-03-19 MED ORDER — SODIUM CHLORIDE 0.9 % IV SOLN: 250.0000 mL | INTRAVENOUS | Status: AC

## 2014-03-19 MED ORDER — LORATADINE 10 MG PO TABS
10.0000 mg | ORAL_TABLET | Freq: Every day | ORAL | Status: DC
  Administered 2014-03-19: 10 mg via ORAL
  Filled 2014-03-19 (×2): qty 1

## 2014-03-19 MED ORDER — ATORVASTATIN CALCIUM 40 MG PO TABS
40.0000 mg | ORAL_TABLET | Freq: Every day | ORAL | Status: DC
  Administered 2014-03-19: 40 mg via ORAL
  Filled 2014-03-19 (×2): qty 1

## 2014-03-19 MED ORDER — HEPARIN (PORCINE) IN NACL 2-0.9 UNIT/ML-% IJ SOLN
INTRAMUSCULAR | Status: AC
  Filled 2014-03-19: qty 500

## 2014-03-19 MED ORDER — FINASTERIDE 5 MG PO TABS
5.0000 mg | ORAL_TABLET | Freq: Every day | ORAL | Status: DC
  Administered 2014-03-19: 5 mg via ORAL
  Filled 2014-03-19 (×2): qty 1

## 2014-03-19 MED ORDER — ACETAMINOPHEN 325 MG PO TABS: 650.0000 mg | ORAL_TABLET | ORAL | Status: DC | PRN

## 2014-03-19 MED ORDER — ONDANSETRON HCL 4 MG/2ML IJ SOLN: 4.0000 mg | Freq: Four times a day (QID) | INTRAMUSCULAR | Status: DC | PRN

## 2014-03-19 MED ORDER — ATENOLOL 12.5 MG HALF TABLET
12.5000 mg | ORAL_TABLET | Freq: Every day | ORAL | Status: DC
Start: 2014-03-20 — End: 2014-03-20
  Filled 2014-03-19: qty 1

## 2014-03-19 MED ORDER — SODIUM CHLORIDE 0.9 % IJ SOLN
3.0000 mL | Freq: Two times a day (BID) | INTRAMUSCULAR | Status: DC
  Administered 2014-03-19: 3 mL via INTRAVENOUS

## 2014-03-19 MED ORDER — SODIUM CHLORIDE 0.9 % IV SOLN: INTRAVENOUS | Status: DC

## 2014-03-19 NOTE — Progress Notes (Signed)
Site area: RFV x2/ Lfv x2 Site Prior to Removal:  Level  o/o Pressure Applied For:10 min bilat Manual:   yes Patient Status During Pull:  stable Post Pull Site:  Level 0/0 Post Pull Instructions Given:  yes Post Pull Pulses Present: palp Dressing Applied:  Clear bilat Bedrest begins @ 1100 Comments: no complications

## 2014-03-19 NOTE — Op Note (Signed)
NAMEKHRISTIAN, [name] NO.:  [id number]  MEDICAL RECORD NO.:  [id number]  LOCATION:  [facility]                         FACILITY:  [facility]  PHYSICIAN:  Deboraha Sprang, MD, FACCDATE OF BIRTH:  1933/03/28  DATE OF PROCEDURE:  03/19/2014 DATE OF DISCHARGE:                              OPERATIVE REPORT   PREOPERATIVE DIAGNOSIS:  Atrial flutter.  POSTOPERATIVE DIAGNOSIS:  Atrial flutter.  PROCEDURE:  Electrophysiological study with catheter mapping.  Following obtaining informed consent, the patient was brought to electrophysiology laboratory and placed on the fluoroscopic table in supine position.  After routine prep and drape, cardiac catheterization was performed with local anesthesia and conscious sedation.  Following the procedure, the catheters were removed.  The sheaths were left in place and the patient was transferred to the holding area in stable condition.  Catheters of 5-French quadripolar catheter was inserted via left femoral vein to the AV junction.  A 6-French octapolar catheter was inserted via right femoral vein to the coronary sinus.  A 7-French dual decapolar catheter was inserted via left femoral vein to the tricuspid anulus.  An 8-French 10-mm tip ablation catheter was inserted via the right femoral vein using an SAFL sheath to mapping sites in the posterior septal space.  Surface leads, 1 AVF and V1 were monitored continuously throughout the procedure, although V1 was turned off because of noise.  END-TIDAL RESULTS:  Basic intervals.  Initial Rhythm:  Sinus; RR interval 1534 milliseconds; PR interval 170 milliseconds; P-wave duration 124 milliseconds; QRS duration 120 milliseconds; QT interval 496 milliseconds.  AH interval 70 milliseconds.  HV interval 33 milliseconds.  Final Rhythm:  Sinus: RR interval 1287 milliseconds; PR interval 147 milliseconds; P-wave duration 109 milliseconds; QRS duration 102 milliseconds; QT interval 464  milliseconds; AH interval 64 milliseconds. HV interval:  40 milliseconds.  END-TIDAL AV NODAL FUNCTION:  AV Wenckebach was 350 milliseconds. VA Wenckebach was 450 milliseconds. AV nodal effective refractory period at 600 milliseconds was less than 280 milliseconds which was atrial ERP, but no evidence of dual AV nodal physiology was demonstrated with double atrial extrastimuli.  ACCESSORY PATHWAY FUNCTION:  No evidence of accessory pathway was identified.  Ventricular response programed stimulation was normal.  Stimulation protocol included incremental atrial pacing.  Incremental ventricular pacing.  Single and double atrial extrastimuli at paced cycle length of 600 milliseconds.  The patient presented to the lab in sinus rhythm.  Anatomical mapping was undertaken.  Catheter ablation was delivered across the cavotricuspid isthmus with a total time of ablation of 16.29 minutes and a total fluoroscopy time 15.1 minutes.  Cavotricuspid isthmus conduction block was demonstrated with an A1, A2 interval of 145 milliseconds.  IMPRESSION: 1. Sinus bradycardia. 2. Abnormal atrial function manifested by previously demonstrated     sustained atrial flutter. 3. Normal AV nodal function. 4. Normal His-Purkinje system function. 5. No accessory pathway. 6. Normal ventricular response programed stimulation.  SUMMARY:  In conclusion, the results of electrophysiological testing demonstrated intact conduction across the cavotricuspid isthmus.  The catheter ablation successfully interrupted this conduction with bidirectional isthmus block.  The patient will be observed overnight.  Anticipate discharge in the morning off apixaban and off atenolol.  Deboraha Sprang, MD, [facility]     SCK/MEDQ  D:  03/19/2014  T:  03/19/2014  Job:  [id number]

## 2014-03-19 NOTE — CV Procedure (Signed)
Donald [name] [id number]  [id number]  Preop WV:[id number]   Postop Dx same/   Procedure:eps WITH CATHETER ABLATION  Cx: None   EBL: Minimal    Dictation number [id number]  Virl Axe, MD 03/19/2014 9:59 AM  WILL STOP APIXOBAN AND DECREASE ATENOLOL  MAY NEED ALTERNATIVE ANTIHYPERTENSIVE TEHREAPY

## 2014-03-19 NOTE — H&P (View-Only) (Signed)
ELECTROPHYSIOLOGY CONSULT NOTE  Patient ID: Donald [name], MRN: [id number], DOB/AGE: [id number] 78 y.o. Admit date: (Not on file) Date of Consult: 03/05/2014  Primary Physician: [name], MD Primary Cardiologist: new  Chief Complaint:  Atrial flutter    HPI Donald [name] is a 80 y.o. male  Identified with atrial flutter and 2:1 conduction when he presented 10/15 for stress test scheduled by PCP for episodic chest pains  He was also struggling with some DOE  He was thus admitted for [facility] and was found at TEE to have significant non quantified cardiomyopathy and bicuspid aortic valve-- it should be noted that AoValve and RCA calcification had been noted on previous ECG  He was discharged on uptitrated dose of atenolol 25-50 and has had some complaints of ongoing DOE; his HR on ECG is 42   He has not had any edema  TERF include age-65, HTN-1 and cardiomyopathy-1  All of this occurs in the context of anormal thickening of the fundus of the stomach concerning for neoplasm for which he is scheduled to undergo endoscopy later in November      Past Medical History  Diagnosis Date  . Hypertension   . Bradycardia   . ED (erectile dysfunction)   . Arthropathy, unspecified, site unspecified   . Prostatitis, unspecified   . Personal history of other diseases of digestive system   . Allergic rhinitis due to pollen   . Benign neoplasm of Donald   . Diverticulosis   . Skin cancer     basal and squamous cell  . Abnormal CT scan, stomach     a. Profound thickening of the fundus and cardia of the stomach, suspicious for potential neoplasm by CT 02/2014. Referred to GI.  Marland Kitchen Atherosclerosis     a. Noted by abdominal CT 02/2014 (h/o normal nuc 2011).  . Habitual alcohol use   . Snoring     a. Pending OSA eval by pulm.  . Hyperlipidemia   . Obesity   . Shortness of breath 12/2013  . Atrial fibrillation and flutter 02/24/14      Surgical History:  Past Surgical History    Procedure Laterality Date  . Inguinal hernia repair      right  . Inguinal hernia repair      left  . Tonsillectomy    . Orif fracture of the elbow    . Squamous cell carcinoma excision    . Skin cancer excision  05/21/12    Squamous cell ca  . Hernia repair    . Tee without cardioversion N/A 02/24/2014    Procedure: TRANSESOPHAGEAL ECHOCARDIOGRAM (TEE);  Surgeon: Lelon Perla, MD;  Location: [facility] ENDOSCOPY;  Service: Cardiovascular;  Laterality: N/A;  . Cardioversion N/A 02/24/2014    Procedure: CARDIOVERSION;  Surgeon: Lelon Perla, MD;  Location: [facility] ENDOSCOPY;  Service: Cardiovascular;  Laterality: N/A;     Home Meds: Prior to Admission medications   Medication Sig Start Date End Date Taking? Authorizing Provider  apixaban (ELIQUIS) 5 MG TABS tablet Take 1 tablet (5 mg total) by mouth 2 (two) times daily. 02/20/14  Yes Deboraha Sprang, MD  atenolol (TENORMIN) 25 MG tablet Take 2 tablets (50 mg total) by mouth daily. 02/20/14  Yes Dayna N Dunn, PA-C  atorvastatin (LIPITOR) 40 MG tablet Take 1 tablet (40 mg total) by mouth daily. 02/03/14  Yes Janith Lima, MD  finasteride (PROSCAR) 5 MG tablet Take 1 tablet (5 mg total) by mouth daily. 01/13/14  Yes Janith Lima, MD  fluticasone (FLONASE) 50 MCG/ACT nasal spray Place 1 spray into both nostrils daily as needed for allergies.   Yes Historical Provider, MD  furosemide (LASIX) 20 MG tablet Take 20 mg by mouth daily.   Yes Historical Provider, MD  loratadine (CLARITIN) 10 MG tablet Take 10 mg by mouth daily.    Yes Historical Provider, MD  losartan (COZAAR) 100 MG tablet Take 1 tablet (100 mg total) by mouth daily. 05/07/13  Yes Neena Rhymes, MD  Multiple Vitamins-Minerals (EYE VITAMINS PO) Take 1 tablet by mouth 2 (two) times daily.   Yes Historical Provider, MD  Multiple Vitamins-Minerals (MULTIVITAMIN,TX-MINERALS) tablet Take 1 tablet by mouth 2 (two) times daily.    Yes Historical Provider, MD     Allergies: No Known  Allergies  History   Social History  . Marital Status: Married    Spouse Name: N/A    Number of Children: 3  . Years of Education: 16   Occupational History  . Land    Social History Main Topics  . Smoking status: Former Smoker -- 1.00 packs/day    Types: Cigarettes, Cigars    Quit date: 09/18/2008  . Smokeless tobacco: Never Used     Comment: Has Occasional Cigar  . Alcohol Use: 10.5 oz/week    21 drink(s) per week     Comment: 1 cocktail and 2-3 glasses of wine per night  . Drug Use: No  . Sexual Activity: Not Currently   Other Topics Concern  . Not on file   Social History Narrative   HSG, Leon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 married 1961. 2 sons- '64, '63 , I daughter- '66, 4 grandchildren. work: Museum/gallery curator, retired but still consults. 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     Family History  Problem Relation Age of Onset  . Cancer Mother     ovarian  . Other Father     renal disease- grief over loss of spouse  . Cancer Brother     prostate- died of hematologic disorder 2nd to chemo  . Diabetes Neg Hx   . Coronary artery disease Neg Hx   . Pulmonary fibrosis Brother      ROS:  Please see the history of present illness.     All other systems reviewed and negative.    Physical Exam:   Blood pressure 112/72, pulse 46, height 5\' 7"  (1.702 m), weight 213 lb (96.616 kg). General: Well developed, well nourished male in no acute distress. Head: Normocephalic, atraumatic, sclera non-icteric, no xanthomas, nares are without discharge. EENT: normal Lymph Nodes:  none Back: without scoliosis/kyphosis , no CVA tendersness Neck: L>R carotid bruits. JVD 8 Lungs: Clear bilaterally to auscultation without wheezes, rales, or rhonchi. Breathing is unlabored. Heart: RRR with S1 S2. 2/6  murmur , rubs, or gallops appreciated. Abdomen: Soft, non-tender, non-distended with  normoactive bowel sounds. No hepatomegaly. No rebound/guarding. No obvious abdominal masses. Msk:  Strength and tone appear normal for age. Extremities: No clubbing or cyanosis. No  edema.  Distal pedal pulses are 2+ and equal bilaterally. Skin: Warm and Dry Neuro: Alert and oriented X 3. CN III-XII intact Grossly normal sensory and motor function . Psych:  Responds to questions appropriately with a normal affect.      Labs: Cardiac Enzymes No results found for this basename: CKTOTAL, CKMB, TROPONINI,  in the last 72 hours CBC Lab Results  Component Value Date   WBC 10.7* 02/20/2014   HGB 15.5 02/20/2014   HCT 47.1 02/20/2014   MCV 100.3* 02/20/2014   PLT 231.0 02/20/2014   PROTIME: No results found for this basename: LABPROT, INR,  in the last 72 hours Chemistry No results found for this basename: NA, K, CL, CO2, BUN, CREATININE, CALCIUM, LABALBU, PROT, BILITOT, ALKPHOS, ALT, AST, GLUCOSE,  in the last 168 hours Lipids Lab Results  Component Value Date   CHOL 190 11/20/2013   HDL 48.90 11/20/2013   LDLCALC 122* 11/20/2013   TRIG 96.0 11/20/2013   BNP No results found for this basename: probnp   Miscellaneous No results found for this basename: DDIMER    Radiology/Studies:  Ct Abdomen Pelvis Wo Contrast  02/18/2014   CLINICAL DATA:  Initial evaluation of a 80 year old male with generalized abdominal pain over the prior 2-3 weeks.  EXAM: CT ABDOMEN AND PELVIS WITHOUT CONTRAST  TECHNIQUE: Multidetector CT imaging of the abdomen and pelvis was performed following the standard protocol without IV contrast.  COMPARISON:  No priors.  FINDINGS: Lower chest: Small hiatal hernia. Calcifications of the aortic valve and inferior mitral annulus. Atherosclerotic calcifications in the right coronary artery.  Hepatobiliary: Multiple well-defined low-attenuation liver lesions are incompletely characterized on today's non contrast CT examination. The largest of these lesions include a 18 x 15 mm lesion in  segment 8 and a 20 x 14 mm lesion in segment 6. The unenhanced appearance of the gallbladder is unremarkable.  Pancreas: Unremarkable.  Spleen: Unremarkable.  Adrenals/Urinary Tract: Normal adrenal glands bilaterally. Two right-sided renal lesions are low-attenuation, and although incompletely characterize are favored to represent small cysts, largest of which measures 2.7 x 2.6 cm in the upper pole the right kidney. There is an exophytic 7 mm intermediate attenuation (31 HU) in the upper pole of the left kidney, also incompletely characterized, but favored to represent a small proteinaceous cyst. No hydroureteronephrosis. Urinary bladder is unremarkable in appearance on today's non contrast CT examination.  Stomach/Bowel: Profound thickening of the fundus and cardia of the stomach immediately adjacent to the gastroesophageal junction where there is a mass like appearance with the wall measuring up to 3 cm in thickness (image 15 of series 3). No surrounding lymphadenopathy. No pathologic dilatation of the small bowel or Donald. There are numerous colonic diverticulae, particularly in the sigmoid Donald, without surrounding inflammatory changes to suggest an acute diverticulitis at this time. Normal appendix.  Vascular/Lymphatic: Extensive atherosclerosis throughout the abdominal and pelvic vasculature, without definite aneurysm. No definite pathologically enlarged lymph nodes are noted in the abdomen or pelvis on today's non contrast CT examination.  Reproductive: Prostate gland is unremarkable in appearance.  Other: Small left inguinal hernia containing only fat. Postoperative changes in the left inguinal region suggesting prior herniorrhaphy. No significant volume of ascites. No pneumoperitoneum.  Musculoskeletal: There are no aggressive appearing lytic or blastic lesions noted in the visualized portions of the skeleton. Old compression fracture of T12 with approximately 20% loss of anterior vertebral body height is  unchanged compared to prior chest CT 05/07/2013.  IMPRESSION: 1. Profound thickening of the fundus and cardia of the stomach, suspicious for potential neoplasm. Correlation with nonemergent endoscopy is recommended in the near future for direct inspection and potential biopsy. 2. Colonic diverticulosis without findings to suggest acute diverticulitis at this time. 3. Multiple low-attenuation lesions in the liver and right kidney are incompletely characterized on today's non contrast CT examination, however, these are favored to represent cysts. There is  also a small exophytic intermediate attenuation lesion in the upper pole of the left kidney measuring 7 mm, likely to represent a proteinaceous cyst. 4. Findings suggestive of prior left inguinal herniorrhaphy. Despite this, there appears to be a recurrent small left inguinal hernia containing only fat. 5. Extensive atherosclerosis, including right coronary artery disease. 6. Additional incidental findings, as above.   Electronically Signed   By: Vinnie Langton M.D.   On: 02/18/2014 17:23    EKG:  Sinsu 46    Assessment and Plan:    atrial flutter with a rapid ventricular rate resolved   cardiomyopathy question mechanism question rate related    aortic valve-bicuspid   sinus bradycardia  Congestive heart failure   The pt presented with atrial flutter with RVR which has been cardioverted. Hopefully this is the cause of his cardiomyopathy and we will be able soon to see interval improvement.  In the event that there is none, he will need investigation as to the cause of his cardiomyopathy, prob in the form of cath.  He will also need medical therapy directed at this  He was also noted to have a bicuspid aortic valve and will need quantitation;  I will arrange followup with Dr London Sheer re his valve  For now the issue is atrial flutter and risk reduction re TE and getting him off his anticoagulation in anticipation for his endoscopy and biopsy  We  have reviewed benefits and risks incl but not limited to perforation, heart block and vascular injury.  He understands agrees and is willing to proceed.    We will undertake echo and then decide re proceeding with flutter ablation    Virl Axe

## 2014-03-19 NOTE — Interval H&P Note (Signed)
History and Physical Interval Note:  03/19/2014 7:52 AM  Donald [name]  has presented today for surgery, with the diagnosis of Atrial flutter  The various methods of treatment have been discussed with the patient and family. After consideration of risks, benefits and other options for treatment, the patient has consented to  Procedure(s): ATRIAL FLUTTER ABLATION (N/A) as a surgical intervention .  The patient's history has been reviewed, patient examined, no change in status, stable for surgery.  I have reviewed the patient's chart and labs.  Questions were answered to the patient's satisfaction.     Virl Axe  ECG sinus bfrady Echo normal EF

## 2014-03-20 ENCOUNTER — Encounter (HOSPITAL_COMMUNITY): Payer: Self-pay | Admitting: Internal Medicine

## 2014-03-20 DIAGNOSIS — I4892 Unspecified atrial flutter: Secondary | ICD-10-CM | POA: Diagnosis not present

## 2014-03-20 DIAGNOSIS — I483 Typical atrial flutter: Secondary | ICD-10-CM

## 2014-03-20 NOTE — Discharge Instructions (Signed)
Atrial Flutter Atrial flutter is a heart rhythm that can cause the heart to beat very fast (tachycardia). It originates in the upper chambers of the heart (atria). In atrial flutter, the top chambers of the heart (atria) often beat much faster than the bottom chambers of the heart (ventricles). Atrial flutter has a regular "saw toothed" appearance in an EKG readout. An EKG is a test that records the electrical activity of the heart. Atrial flutter can cause the heart to beat up to 150 beats per minute (BPM). Atrial flutter can either be short lived (paroxysmal) or permanent.  CAUSES  Causes of atrial flutter can be many. Some of these include:  Heart related issues:  Heart attack (myocardial infarction).  Heart failure.  Heart valve problems.  Poorly controlled high blood pressure (hypertension).  After open heart surgery.  Lung related issues:  A blood clot in the lungs (pulmonary embolism).  Chronic obstructive pulmonary disease (COPD). Medications used to treat COPD can attribute to atrial flutter.  Other related causes:  Hyperthyroidism.  Caffeine.  Some decongestant cold medications.  Low electrolyte levels such as potassium or magnesium.  Cocaine. SYMPTOMS  An awareness of your heart beating rapidly (palpitations).  Shortness of breath.  Chest pain.  Low blood pressure (hypotension).  Dizziness or fainting. DIAGNOSIS  Different tests can be performed to diagnose atrial flutter.   An EKG.  Holter monitor. This is a 24-hour recording of your heart rhythm. You will also be given a diary. Write down all symptoms that you have and what you were doing at the time you experienced symptoms.  Cardiac event monitor. This small device can be worn for up to 30 days. When you have heart symptoms, you will push a button on the device. This will then record your heart rhythm.  Echocardiogram. This is an imaging test to look at your heart. Your caregiver will look at your  heart valves and the ventricles.  Stress test. This test can help determine if the atrial flutter is related to exercise or if coronary artery disease is present.  Laboratory studies will look at certain blood levels like:  Complete blood count (CBC).  Potassium.  Magnesium.  Thyroid function. TREATMENT  Treatment of atrial flutter varies. A combination of therapies may be used or sometimes atrial flutter may need only 1 type of treatment.  Lab work: If your blood work, such as your electrolytes (potassium, magnesium) or your thyroid function tests, are abnormal, your caregiver will treat them accordingly.  Medication:  There are several different types of medications that can convert your heart to a normal rhythm and prevent atrial flutter from reoccurring.  Nonsurgical procedures: Nonsurgical techniques may be used to control atrial flutter. Some examples include:  Cardioversion. This technique uses either drugs or an electrical shock to restore a normal heart rhythm:  Cardioversion drugs may be given through an intravenous (IV) line to help "reset" the heart rhythm.  In electrical cardioversion, your caregiver shocks your heart with electrical energy. This helps to reset the heartbeat to a normal rhythm.  Ablation. If atrial flutter is a persistent problem, an ablation may be needed. This procedure is done under mild sedation. High frequency radio-wave energy is used to destroy the area of heart tissue responsible for atrial flutter. SEEK IMMEDIATE MEDICAL CARE IF:  You have:  Dizziness.  Near fainting or fainting.  Shortness of breath.  Chest pain or pressure.  Sudden nausea or vomiting.  Profuse sweating. If you have the above symptoms,  call your local emergency service immediately! Do not drive yourself to the hospital. MAKE SURE YOU:   Understand these instructions.  Will watch your condition.  Will get help right away if you are not doing well or get  worse. Document Released: 09/18/2008 Document Revised: 09/16/2013 Document Reviewed: 09/18/2008 [facility] Patient Information 2015 [place], [place]. This information is not intended to replace advice given to you by your health care provider. Make sure you discuss any questions you have with your health care provider.  Electrophysiology Study An electrophysiology (EP) study is an invasive heart test done through catheters placed in a large vein in your groin, arm, neck, or chest. It is done to evaluate the electrical conduction system of your heart. An EP study is done if other heart tests have not found or fully explained the cause of symptoms such as:  Dizziness or fainting.  A fast heartbeat (tachycardia).  A slow heartbeat (bradycardia). If the cause of your symptoms is found during the EP study, your health care provider will discuss treatment options. Some treatment options that may be done to correct your symptoms include:  Cardiac ablation. Cardiac ablation destroys a small area of heart tissue that may be causing tachycardia.  Implantable cardioverter defibrillator (ICD). An ICD can detect a fast or abnormal heart rate. When an abnormal rhythm is detected, the ICD shocks the heart to restore it to a normal heart rhythm. LET [facility] CARE PROVIDER KNOW ABOUT:   Any allergies you have.  All medicines you are taking, including vitamins, herbs, eye drops, creams, and over-the-counter medicines.  Previous problems you or members of your family have had with the use of anesthetics.  Any blood disorders you have.  Previous surgeries you have had.  Medical conditions you have. RISKS AND COMPLICATIONS  Generally, this is a safe procedure. However, as with any procedure, problems can occur. Possible problems include:  Tachycardia that does not go away. This may require shocking your heart (cardioversion).  Bleeding or bruising from the catheter insertion sites.  Infection at the  catheter insertion sites.  Temporary or permanent heart rhythm abnormalities.  Temporary changes in blood pressure.  Puncture (perforation) of the heart wall. This can cause bleeding between the heart and the sac that surrounds it (cardiac tamponade). This is a life-threatening condition that may require heart surgery.  Possible cardiac arrest or fatal heart arrhythmia. BEFORE THE PROCEDURE  Do not eat or drink before the EP study as instructed by your health care provider.  Be sure to urinate before the EP study.  If you are going home after the EP study, someone will need to drive you home and stay with you for 24 hours. PROCEDURE The EP study is performed in a catheterization laboratory. This is a room set up to do heart procedures.   You will be given medicine through an intravenous (IV) access to reduce discomfort and help you relax.  The area where the catheter will be inserted will be shaved as needed and cleansed. Sterile drapes will cover you. This will keep the area sterile.  Thin, flexible tubes (catheters) with an electrode tip will be inserted into a large vein. From there, the catheters are guided to the heart using a type of X-ray machine (fluoroscopy). Once in the heart, the catheters evaluate the electrical activity of your heart.  If you are awake during the EP study, you may feel dizzy or light-headed. Your heart rate may temporarily increase or you may feel your heart  beating hard. Tell your health care provider if you feel dizzy, nauseated, or have chest pain or pressure during the EP study.  When the EP study is done, the catheters are removed.  Firm pressure is applied to the insertion sites. This is done to prevent bleeding. AFTER THE PROCEDURE  You will need to lie flat for a few hours or as told by your health care provider. You will need to keep your legs straight. Do not bend or cross your legs. This is done so the clot at the insertion does not break loose  and cause bleeding.  If you took blood thinners before the EP study, ask your health care provider when you can start taking them again. Document Released: 10/20/2009 Document Revised: 05/07/2013 Document Reviewed: 11/14/2012 [facility] Patient Information 2015 [place], [place]. This information is not intended to replace advice given to you by your health care provider. Make sure you discuss any questions you have with your health care provider.  No driving for 24 hrs. No lifting over 5 lbs for 1 week. No sexual activity for 1 week. Keep procedure site clean & dry. If you notice increased pain, swelling, bleeding or pus, call/return!  You may shower, but no soaking baths/hot tubs/pools for 1 week.

## 2014-03-20 NOTE — Progress Notes (Signed)
       Patient Name: Donald [name]      SUBJECTIVE: without chest pain sob or groin discomfort  Past Medical History  Diagnosis Date  . Hypertension   . Bradycardia   . ED (erectile dysfunction)   . Arthropathy, unspecified, site unspecified   . Prostatitis, unspecified   . Personal history of other diseases of digestive system   . Allergic rhinitis due to pollen   . Benign neoplasm of colon   . Diverticulosis   . Skin cancer     basal and squamous cell  . Abnormal CT scan, stomach     a. Profound thickening of the fundus and cardia of the stomach, suspicious for potential neoplasm by CT 02/2014. Referred to GI.  Marland Kitchen Atherosclerosis     a. Noted by abdominal CT 02/2014 (h/o normal nuc 2011).  . Habitual alcohol use   . Snoring     a. Pending OSA eval by pulm.  . Hyperlipidemia   . Obesity   . Shortness of breath 12/2013  . Atrial fibrillation and flutter 02/24/14  . Dysrhythmia     atrial flutter  . GERD (gastroesophageal reflux disease)     Scheduled Meds:  Scheduled Meds: . atenolol  12.5 mg Oral Daily  . atorvastatin  40 mg Oral q1800  . finasteride  5 mg Oral Daily  . furosemide  20 mg Oral Daily  . loratadine  10 mg Oral Daily  . losartan  100 mg Oral Daily  . sodium chloride  3 mL Intravenous Q12H   Continuous Infusions:  acetaminophen, fluticasone, ondansetron (ZOFRAN) IV, sodium chloride    PHYSICAL EXAM Filed Vitals:   03/19/14 1110 03/19/14 1115 03/19/14 1427 03/19/14 2006  BP:   139/63 126/69  Pulse: 42 38 41 46  Temp:   97.6 F (36.4 C) 98 F (36.7 C)  TempSrc:   Oral Oral  Resp: 15 11 18 18   Height:      Weight:      SpO2: 99% 100% 99% 96%    Well developed and nourished in no acute distress HENT normal Neck supple with JVP-flat Clear Groins without hematoma Regular rate and rhythm, no murmurs or gallops Abd-soft with active BS No Clubbing cyanosis edema Skin-warm and dry A & Oriented  Grossly normal sensory and motor  function   TELEMETRY: Reviewed telemetry pt in NSR    Intake/Output Summary (Last 24 hours) at 03/20/14 0750 Last data filed at 03/19/14 1300  Gross per 24 hour  Intake    120 ml  Output      0 ml  Net    120 ml     ASSESSMENT AND PLAN:  Active Problems:   Atrial flutter with rapid ventricular response   Atrial flutter sinus bradycardia   S/p ablation Discharge today withouot apixoban or betablockers  Signed, Virl Axe MD  03/20/2014

## 2014-03-20 NOTE — Discharge Summary (Signed)
ELECTROPHYSIOLOGY PROCEDURE DISCHARGE SUMMARY    Patient ID: Donald [name],  MRN: [id number], DOB/AGE: 09-10-1932 80 y.o.  Admit date: 03/19/2014 Discharge date: 03/20/2014  Primary Care Physician: [name], MD Electrophysiologist: Caryl Comes  Primary Discharge Diagnosis:  Atrial flutter status post ablation this admission  Secondary Discharge Diagnosis:  1.  Hypertension 2.  Bradycardia 3.  Erectile dysfunction 4.  Abdominal mass- undergoing workup for potential stomach neoplasm 5.  Obesity  No Known Allergies   Procedures This Admission: 1.  Electrophysiology study and radiofrequency catheter ablation on 03-19-2014 by Dr Caryl Comes.  This study demonstrated typical atrial flutter with successful CTI ablation.  There were no inducible arrhythmias following ablation and no early apparent complications.   Brief HPI: Donald [name] is a 80 y.o. male with a past medical history as outlined above.  He has documented atrial flutter identified upon referral for GXT to evaluate episodic chest pain. He underwent TEE guided cardioversion at which time his EF was 30%; this subsequently normalized in SR. Risks, benefits, and alternatives to ablation were reviewed with the patient who wished to proceed.   Hospital Course:  The patient was admitted and underwent EPS/RFCA with details as outlined above. He was monitored on telemetry overnight which demonstrated sinus rhythm.  Groin incisions were without complication.  They were examined by Dr Caryl Comes who considered them stable for discharge to home.  Follow up will be arranged in 4 weeks.  Wound care and restrictions were reviewed with the patient prior to discharge.   Anticoagulation and beta blockers will be discontinued at discharge due to the patient being in SR upon presentation and history of bradycardia.   Discharge Vitals: Blood pressure 126/69, pulse 46, temperature 98 F (36.7 C), temperature source Oral, resp. rate 18, height 5\' 7"   (1.702 m), weight 213 lb (96.616 kg), SpO2 96 %.   Labs:   Lab Results  Component Value Date   WBC 8.2 03/10/2014   HGB 13.5 03/10/2014   HCT 40.6 03/10/2014   MCV 98.5 03/10/2014   PLT 154.0 03/10/2014    Discharge Medications:    Medication List    STOP taking these medications        apixaban 5 MG Tabs tablet  Commonly known as:  ELIQUIS     atenolol 25 MG tablet  Commonly known as:  TENORMIN      TAKE these medications        atorvastatin 40 MG tablet  Commonly known as:  LIPITOR  Take 1 tablet (40 mg total) by mouth daily.     EYE VITAMINS PO  Take 1 tablet by mouth 2 (two) times daily. Macular Supplement     MULTIVITAMIN GUMMIES ADULT PO  Take 1 tablet by mouth 2 (two) times daily.     finasteride 5 MG tablet  Commonly known as:  PROSCAR  Take 1 tablet (5 mg total) by mouth daily.     fluticasone 50 MCG/ACT nasal spray  Commonly known as:  FLONASE  Place 1 spray into both nostrils daily as needed for allergies.     furosemide 20 MG tablet  Commonly known as:  LASIX  Take 20 mg by mouth daily.     loratadine 10 MG tablet  Commonly known as:  CLARITIN  Take 10 mg by mouth daily.     losartan 100 MG tablet  Commonly known as:  COZAAR  Take 1 tablet (100 mg total) by mouth daily.     OVER THE  COUNTER MEDICATION  Take 1 tablet by mouth daily as needed (for stomach acid. OTC Acid Reducer).        Disposition:   Follow-up Information    Follow up with Virl Axe, MD On 04/21/2014.   Specialty:  Cardiology   Why:  9:00am   Contact information:   [address] [address] [phone]       Duration of Discharge Encounter: Greater than 30 minutes including physician time.  Signed,

## 2014-03-24 ENCOUNTER — Encounter: Payer: Self-pay | Admitting: Internal Medicine

## 2014-03-24 ENCOUNTER — Telehealth: Payer: Self-pay | Admitting: Internal Medicine

## 2014-03-24 NOTE — Telephone Encounter (Signed)
See patient email today for documentation

## 2014-03-24 NOTE — Telephone Encounter (Signed)
New message    Patient calling s/p ablation last Wednesday .     C/o heart rated double on yesterday. It has recent gone down.

## 2014-03-24 NOTE — Telephone Encounter (Signed)
Called patient and discussed heart rates. It lasted from about 2pm - dinner time. No reoccurrences. He will continue to monitor heart rates and call back if it happens again or symptoms arise. Keep f/u appt on  12/7. Patient verbalized understanding and agreeable to plan.

## 2014-03-30 ENCOUNTER — Encounter: Payer: Self-pay | Admitting: Internal Medicine

## 2014-03-31 ENCOUNTER — Telehealth: Payer: Self-pay | Admitting: *Deleted

## 2014-03-31 ENCOUNTER — Ambulatory Visit (AMBULATORY_SURGERY_CENTER): Payer: Self-pay | Admitting: *Deleted

## 2014-03-31 VITALS — Ht 69.0 in | Wt 213.0 lb

## 2014-03-31 DIAGNOSIS — R933 Abnormal findings on diagnostic imaging of other parts of digestive tract: Secondary | ICD-10-CM

## 2014-03-31 NOTE — Telephone Encounter (Signed)
Team,  This pt qualifies for care at [facility]  Thanks,  Jenny Reichmann

## 2014-03-31 NOTE — Telephone Encounter (Signed)
Yes CC: Osvaldo Angst with anesthesia to be sure he agrees

## 2014-03-31 NOTE — Progress Notes (Signed)
Patient denies any allergies to eggs or soy. Patient denies any problems with anesthesia/sedation. Patient denies any oxygen use at home and does not take any diet/weight loss medications. He did not want EMMI information. Phone note sent to Dr.Pyrtle PV:XYIAX ablation and increased HR x 2.

## 2014-03-31 NOTE — Telephone Encounter (Signed)
Noted. Thanks. PV will follow up.

## 2014-03-31 NOTE — Telephone Encounter (Signed)
Patient is here today for pre-visit, for EGD on 04-08-14. Since his office visit with you on 02-25-14, patient has had heart ablation on 03-19-14. He is no longer on Eliquis or Atenolol per cardiologist(Dr.Klein). Patient states he has had 2 episodes of increased heart rate, last time this happened was "Saturday night", heart rate 141. He did not call anyone about this. He states his cardiologist does know heart rate increased and he has appointment on 04-21-14. Patient denies any symptoms at this time. Patient is for sleep study also. Is patient ok for EGD as planned? Thank you, Ardenia Stiner.

## 2014-04-01 ENCOUNTER — Encounter: Payer: Self-pay | Admitting: Internal Medicine

## 2014-04-01 NOTE — Telephone Encounter (Signed)
Called pt and informed him it was OK to have his  endoscopy at [facility] per Dr Hilarie Fredrickson and our anesthetist. Pt understood.

## 2014-04-06 ENCOUNTER — Encounter (HOSPITAL_COMMUNITY): Payer: Self-pay | Admitting: *Deleted

## 2014-04-08 ENCOUNTER — Inpatient Hospital Stay (HOSPITAL_COMMUNITY)
Admission: AD | Admit: 2014-04-08 | Discharge: 2014-04-10 | DRG: 309 | Disposition: A | Discharge status: Home / Self Care | Payer: Medicare Other | Source: Ambulatory Visit | Attending: Cardiovascular Disease | Admitting: Cardiovascular Disease

## 2014-04-08 ENCOUNTER — Encounter: Payer: Self-pay | Admitting: Internal Medicine

## 2014-04-08 ENCOUNTER — Encounter (HOSPITAL_COMMUNITY): Payer: Self-pay | Admitting: *Deleted

## 2014-04-08 ENCOUNTER — Ambulatory Visit: Payer: Medicare Other | Admitting: Internal Medicine

## 2014-04-08 VITALS — BP 115/76 | HR 120 | Temp 96.6°F | Ht 69.0 in | Wt 213.0 lb

## 2014-04-08 DIAGNOSIS — Z79899 Other long term (current) drug therapy: Secondary | ICD-10-CM

## 2014-04-08 DIAGNOSIS — K222 Esophageal obstruction: Secondary | ICD-10-CM | POA: Diagnosis present

## 2014-04-08 DIAGNOSIS — C161 Malignant neoplasm of fundus of stomach: Secondary | ICD-10-CM | POA: Diagnosis present

## 2014-04-08 DIAGNOSIS — I4891 Unspecified atrial fibrillation: Secondary | ICD-10-CM | POA: Diagnosis present

## 2014-04-08 DIAGNOSIS — R009 Unspecified abnormalities of heart beat: Secondary | ICD-10-CM | POA: Diagnosis present

## 2014-04-08 DIAGNOSIS — Z6830 Body mass index (BMI) 30.0-30.9, adult: Secondary | ICD-10-CM

## 2014-04-08 DIAGNOSIS — I429 Cardiomyopathy, unspecified: Secondary | ICD-10-CM | POA: Diagnosis present

## 2014-04-08 DIAGNOSIS — I739 Peripheral vascular disease, unspecified: Secondary | ICD-10-CM | POA: Diagnosis present

## 2014-04-08 DIAGNOSIS — K2970 Gastritis, unspecified, without bleeding: Secondary | ICD-10-CM

## 2014-04-08 DIAGNOSIS — Z85828 Personal history of other malignant neoplasm of skin: Secondary | ICD-10-CM

## 2014-04-08 DIAGNOSIS — E785 Hyperlipidemia, unspecified: Secondary | ICD-10-CM | POA: Diagnosis present

## 2014-04-08 DIAGNOSIS — K219 Gastro-esophageal reflux disease without esophagitis: Secondary | ICD-10-CM | POA: Diagnosis present

## 2014-04-08 DIAGNOSIS — I4892 Unspecified atrial flutter: Secondary | ICD-10-CM | POA: Diagnosis present

## 2014-04-08 DIAGNOSIS — E669 Obesity, unspecified: Secondary | ICD-10-CM | POA: Diagnosis present

## 2014-04-08 DIAGNOSIS — I480 Paroxysmal atrial fibrillation: Principal | ICD-10-CM | POA: Diagnosis present

## 2014-04-08 DIAGNOSIS — Z87891 Personal history of nicotine dependence: Secondary | ICD-10-CM

## 2014-04-08 DIAGNOSIS — C169 Malignant neoplasm of stomach, unspecified: Secondary | ICD-10-CM | POA: Diagnosis present

## 2014-04-08 DIAGNOSIS — K2990 Gastroduodenitis, unspecified, without bleeding: Secondary | ICD-10-CM | POA: Diagnosis present

## 2014-04-08 DIAGNOSIS — I129 Hypertensive chronic kidney disease with stage 1 through stage 4 chronic kidney disease, or unspecified chronic kidney disease: Secondary | ICD-10-CM | POA: Diagnosis present

## 2014-04-08 DIAGNOSIS — N189 Chronic kidney disease, unspecified: Secondary | ICD-10-CM | POA: Diagnosis present

## 2014-04-08 HISTORY — DX: Cardiomyopathy, unspecified: I42.9

## 2014-04-08 HISTORY — DX: Paroxysmal atrial fibrillation: I48.0

## 2014-04-08 LAB — COMPREHENSIVE METABOLIC PANEL
ALT: 20 U/L (ref 0–53)
ANION GAP: 15 (ref 5–15)
AST: 22 U/L (ref 0–37)
Albumin: 3.9 g/dL (ref 3.5–5.2)
Alkaline Phosphatase: 75 U/L (ref 39–117)
BILIRUBIN TOTAL: 0.9 mg/dL (ref 0.3–1.2)
BUN: 16 mg/dL (ref 6–23)
CALCIUM: 9.5 mg/dL (ref 8.4–10.5)
CHLORIDE: 104 meq/L (ref 96–112)
CO2: 22 meq/L (ref 19–32)
Creatinine, Ser: 0.87 mg/dL (ref 0.50–1.35)
GFR, EST NON AFRICAN AMERICAN: 80 mL/min — AB (ref >90)
Glucose, Bld: 86 mg/dL (ref 70–99)
Potassium: 4.1 mEq/L (ref 3.7–5.3)
Sodium: 141 mEq/L (ref 137–147)
Total Protein: 7 g/dL (ref 6.0–8.3)

## 2014-04-08 LAB — TSH: TSH: 0.743 u[IU]/mL (ref 0.350–4.500)

## 2014-04-08 LAB — TROPONIN I: Troponin I: <0.3 ng/mL (ref <0.30)

## 2014-04-08 LAB — PROTIME-INR
INR: 1.19 (ref 0.00–1.49)
Prothrombin Time: 15.3 seconds — ABNORMAL HIGH (ref 11.6–15.2)

## 2014-04-08 LAB — APTT: aPTT: 31 seconds (ref 24–37)

## 2014-04-08 MED ORDER — ACETAMINOPHEN 325 MG PO TABS: 650.0000 mg | ORAL_TABLET | ORAL | Status: DC | PRN

## 2014-04-08 MED ORDER — DILTIAZEM HCL 100 MG IV SOLR
5.0000 mg/h | INTRAVENOUS | Status: DC
  Administered 2014-04-08: 5 mg/h via INTRAVENOUS
  Administered 2014-04-08: 15 mg/h via INTRAVENOUS
  Administered 2014-04-09: 10 mg/h via INTRAVENOUS
  Administered 2014-04-09 (×2): 15 mg/h via INTRAVENOUS
  Administered 2014-04-10: 10 mg/h via INTRAVENOUS
  Filled 2014-04-08 (×7): qty 100

## 2014-04-08 MED ORDER — ONDANSETRON HCL 4 MG/2ML IJ SOLN: 4.0000 mg | Freq: Four times a day (QID) | INTRAMUSCULAR | Status: DC | PRN

## 2014-04-08 MED ORDER — OCUVITE-LUTEIN PO CAPS
1.0000 | ORAL_CAPSULE | Freq: Every day | ORAL | Status: DC
  Administered 2014-04-08 – 2014-04-09 (×2): 1 via ORAL
  Filled 2014-04-08 (×3): qty 1

## 2014-04-08 MED ORDER — FUROSEMIDE 20 MG PO TABS
20.0000 mg | ORAL_TABLET | Freq: Every day | ORAL | Status: DC
  Administered 2014-04-09 – 2014-04-10 (×2): 20 mg via ORAL
  Filled 2014-04-08 (×3): qty 1

## 2014-04-08 MED ORDER — PANTOPRAZOLE SODIUM 40 MG PO TBEC
40.0000 mg | DELAYED_RELEASE_TABLET | Freq: Every day | ORAL | Status: DC
  Administered 2014-04-09 – 2014-04-10 (×2): 40 mg via ORAL
  Filled 2014-04-08 (×2): qty 1

## 2014-04-08 MED ORDER — HEPARIN BOLUS VIA INFUSION
4000.0000 [IU] | Freq: Once | INTRAVENOUS | Status: AC
  Administered 2014-04-08: 4000 [IU] via INTRAVENOUS
  Filled 2014-04-08: qty 4000

## 2014-04-08 MED ORDER — ATORVASTATIN CALCIUM 40 MG PO TABS
40.0000 mg | ORAL_TABLET | Freq: Every day | ORAL | Status: DC
  Administered 2014-04-08 – 2014-04-09 (×2): 40 mg via ORAL
  Filled 2014-04-08 (×3): qty 1

## 2014-04-08 MED ORDER — EYE VITAMINS PO CAPS: 1.0000 | ORAL_CAPSULE | Freq: Two times a day (BID) | ORAL | Status: DC

## 2014-04-08 MED ORDER — FINASTERIDE 5 MG PO TABS
5.0000 mg | ORAL_TABLET | Freq: Every day | ORAL | Status: DC
  Administered 2014-04-09 – 2014-04-10 (×2): 5 mg via ORAL
  Filled 2014-04-08 (×2): qty 1

## 2014-04-08 MED ORDER — LORATADINE 10 MG PO TABS
10.0000 mg | ORAL_TABLET | Freq: Every day | ORAL | Status: DC
  Filled 2014-04-08 (×3): qty 1

## 2014-04-08 MED ORDER — FLUTICASONE PROPIONATE 50 MCG/ACT NA SUSP
1.0000 | Freq: Every day | NASAL | Status: DC | PRN
  Filled 2014-04-08: qty 16

## 2014-04-08 MED ORDER — ZOLPIDEM TARTRATE 5 MG PO TABS: 5.0000 mg | ORAL_TABLET | Freq: Every evening | ORAL | Status: DC | PRN

## 2014-04-08 MED ORDER — LOSARTAN POTASSIUM 50 MG PO TABS
100.0000 mg | ORAL_TABLET | Freq: Every day | ORAL | Status: DC
  Filled 2014-04-08 (×2): qty 2

## 2014-04-08 MED ORDER — DILTIAZEM LOAD VIA INFUSION
15.0000 mg | Freq: Once | INTRAVENOUS | Status: AC
  Administered 2014-04-08: 15 mg via INTRAVENOUS
  Filled 2014-04-08: qty 15

## 2014-04-08 MED ORDER — HEPARIN (PORCINE) IN NACL 100-0.45 UNIT/ML-% IJ SOLN
1500.0000 [IU]/h | INTRAMUSCULAR | Status: DC
Start: 2014-04-08 — End: 2014-04-10
  Administered 2014-04-08: 1250 [IU]/h via INTRAVENOUS
  Administered 2014-04-10: 1350 [IU]/h via INTRAVENOUS
  Filled 2014-04-08 (×3): qty 250

## 2014-04-08 MED ORDER — ALPRAZOLAM 0.25 MG PO TABS: 0.2500 mg | ORAL_TABLET | Freq: Two times a day (BID) | ORAL | Status: DC | PRN

## 2014-04-08 MED ORDER — NITROGLYCERIN 0.4 MG SL SUBL: 0.4000 mg | SUBLINGUAL_TABLET | SUBLINGUAL | Status: DC | PRN

## 2014-04-08 NOTE — Progress Notes (Addendum)
Upon arrival to the [facility], pt's heart rate in the 120's- apical pulse irregular.  He told Probation officer he felt fine, no SOB or chest pain.  Starleen Arms CRNA and Dr. Hilarie Fredrickson made aware.  Pt tells Starleen Arms that when his "heart rhythm is out of whack, he can feel a sensation in his throat; like running on a cold day."  He states that he slightly feels this way, rating discomfort as a 0.25 out of 10.  Dr. Hilarie Fredrickson is going to speak with pt's cardiologist.  Pt is in admitting, attached to monitor and aware of situation.   Dr Hilarie Fredrickson has spoken to Dr. Caryl Comes, who recommends pt go to [facility] for overnight visit to get his heart rate under control. Pt is agreeable to this and will go home to gather materials before hospital visit.  Dr. Hilarie Fredrickson will call pt once he hears from Dr. Caryl Comes on where to go at [facility].  Pt understands

## 2014-04-08 NOTE — Progress Notes (Signed)
Pt is set up for EGD with Dr Deatra Ina at 11:30 tomorrow.  Will need to stop the heparin drip at 5:30 AM tomorrow to allow for biopsy.  Ok to eat solids tonite, then NPO post midnite. Will only proceed to EGD if rate is controlled.   Azucena Freed PA-C

## 2014-04-08 NOTE — Progress Notes (Signed)
Trish with Cards paged to admit pt, PA coming to evaluate Rickard Rhymes, RN

## 2014-04-08 NOTE — H&P (Signed)
History and Physical   Patient ID: Donald [name] MRN: [id number], DOB/AGE: 1932-12-17 80 y.o. Date of Encounter: 04/08/2014  Primary Physician: [name], MD Primary Cardiologist: Dr Caryl Comes  Chief Complaint:  Atrial fib, RVR  HPI: Donald [name] is a 80 y.o. male with a history of sinus bradycardia and atrial flutter. He had atrial flutter ablation on 11/04 and was discharged on 11/05, in sinus rhythm.  He takes his blood pressure and heart rate daily. After discharge, his blood pressure was fairly steady and his heart rate was less than 75 every time it was checked until Sunday, 11/15. That day, his heart rate was 144.   His machine indicates an irregular heart rate every time it is checked, so he may have been in atrial fibrillation ever since shortly after discharge. He has had no exertional shortness of breath. He had a brief episode of right-sided chest pain that resolved without intervention. He occasionally has a fullness or pressure in his throat that also resolved without intervention.  He has not been on a beta blocker or anticoagulated with Eliquis since discharge.   He is being evaluated for potential stomach neoplasm. He was scheduled for an EGD and came to the hospital today for the procedure. Upon arrival, his heart rate was 166 and the procedure was canceled. He was admitted. Currently he is resting comfortably and is asymptomatic. His heart rate is in the 150s.  Past Medical History  Diagnosis Date  . Hypertension   . Sinus bradycardia   . ED (erectile dysfunction)   . Arthropathy, unspecified, site unspecified   . Prostatitis, unspecified   . Personal history of other diseases of digestive system   . Allergic rhinitis due to pollen   . Benign neoplasm of colon   . Diverticulosis   . Skin cancer     basal and squamous cell  . Abnormal CT scan, stomach     a. Profound thickening of the fundus and cardia of the stomach, suspicious for potential neoplasm  by CT 02/2014. Referred to GI.  Marland Kitchen Atherosclerosis     a. Noted by abdominal CT 02/2014 (h/o normal nuc 2011).  . Habitual alcohol use   . Snoring     a. Pending OSA eval by pulm.  . Hyperlipidemia   . Obesity   . Shortness of breath 12/2013  . Atrial flutter 02/24/14    s/p ablation 11/15  . GERD (gastroesophageal reflux disease)     Surgical History:  Past Surgical History  Procedure Laterality Date  . Inguinal hernia repair      right  . Inguinal hernia repair      left  . Tonsillectomy    . Orif fracture of the elbow    . Squamous cell carcinoma excision    . Skin cancer excision  05/21/12    Squamous cell ca  . Hernia repair    . Tee without cardioversion N/A 02/24/2014    Procedure: TRANSESOPHAGEAL ECHOCARDIOGRAM (TEE);  Surgeon: Lelon Perla, MD;  Location: [facility] ENDOSCOPY;  Service: Cardiovascular;  Laterality: N/A;  . Cardioversion N/A 02/24/2014    Procedure: CARDIOVERSION;  Surgeon: Lelon Perla, MD;  Location: [facility] ENDOSCOPY;  Service: Cardiovascular;  Laterality: N/A;  . Ablation  03/19/2014    RFCA of atrial flutter by Dr Caryl Comes    I have reviewed the patient's current medications. Prior to Admission medications   Medication Sig Start Date End Date Taking? Authorizing Provider  atorvastatin (LIPITOR)  40 MG tablet Take 1 tablet (40 mg total) by mouth daily. 02/03/14   Janith Lima, MD  finasteride (PROSCAR) 5 MG tablet Take 1 tablet (5 mg total) by mouth daily. 01/13/14   Janith Lima, MD  fluticasone (FLONASE) 50 MCG/ACT nasal spray Place 1 spray into both nostrils daily as needed for allergies.    Historical Provider, MD  furosemide (LASIX) 20 MG tablet Take 20 mg by mouth daily.    Historical Provider, MD  loratadine (CLARITIN) 10 MG tablet Take 10 mg by mouth daily.     Historical Provider, MD  losartan (COZAAR) 100 MG tablet Take 1 tablet (100 mg total) by mouth daily. 05/07/13   Neena Rhymes, MD  Multiple Vitamins-Minerals (EYE VITAMINS PO) Take 1  tablet by mouth 2 (two) times daily. Macular Supplement    Historical Provider, MD  Multiple Vitamins-Minerals (MULTIVITAMIN GUMMIES ADULT PO) Take 1 tablet by mouth 2 (two) times daily.    Historical Provider, MD  OVER THE COUNTER MEDICATION Take 1 tablet by mouth daily as needed (for stomach acid. OTC Acid Reducer).    Historical Provider, MD   Allergies: No Known Allergies  History   Social History  . Marital Status: Married    Spouse Name: N/A    Number of Children: 3  . Years of Education: 16   Occupational History  . Land    Social History Main Topics  . Smoking status: Former Smoker -- 1.00 packs/day    Types: Cigarettes, Cigars    Quit date: 09/18/2008  . Smokeless tobacco: Never Used     Comment: Has Occasional Cigar  . Alcohol Use: 25.2 oz/week    21 Glasses of wine, 21 Not specified per week     Comment: 1 cocktail and 2-3 glasses of wine per night  . Drug Use: No  . Sexual Activity: Not Currently   Other Topics Concern  . Not on file   Social History Narrative   HSG, Amanda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 married 1961. 2 sons- '64, '63 , I daughter- '66, 4 grandchildren. work: Museum/gallery curator, retired but still consults. 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    Family History  Problem Relation Age of Onset  . Cancer Mother     ovarian  . Other Father     renal disease- grief over loss of spouse  . Cancer Brother     prostate- died of hematologic disorder 2nd to chemo  . Diabetes Neg Hx   . Coronary artery disease Neg Hx   . Colon cancer Neg Hx   . Stomach cancer Neg Hx   . Rectal cancer Neg Hx   . Pulmonary fibrosis Brother    Family Status  Relation Status Death Age  . Mother Deceased   . Father Deceased 69  . Brother Deceased   . Daughter Alive   . Son Alive   . Son Alive     Review of Systems:   Full 14-point review of systems otherwise negative  except as noted above.  Physical Exam: Blood pressure 130/90, pulse 50, temperature 97.4 F (36.3 C), temperature source Oral, resp. rate 18, height 5\' 9"  (1.753 m), weight 212 lb 1.3 oz (96.2 kg), SpO2 99 %. General: Well developed, well nourished,male in no acute distress. Head: Normocephalic, atraumatic, sclera non-icteric, no xanthomas, nares are without discharge. Dentition:  Good Neck: No carotid bruits. JVD not elevated.  No thyromegally Lungs: Good expansion bilaterally. without wheezes or rhonchi.  Heart: Irregular rate and rhythm with S1 S2.  No S3 or S4.  No murmur, no rubs, or gallops appreciated. Abdomen: Soft, non-tender, non-distended with normoactive bowel sounds. No hepatomegaly. No rebound/guarding. No obvious abdominal masses. Msk:  Strength and tone appear normal for age. No joint deformities or effusions, no spine or costo-vertebral angle tenderness. Extremities: No clubbing or cyanosis. No edema.  Distal pedal pulses are 2+ in 4 extrem Neuro: Alert and oriented X 3. Moves all extremities spontaneously. No focal deficits noted. Psych:  Responds to questions appropriately with a normal affect. Skin: No rashes or lesions noted  Labs:   Lab Results  Component Value Date   WBC 8.2 03/10/2014   HGB 13.5 03/10/2014   HCT 40.6 03/10/2014   MCV 98.5 03/10/2014   PLT 154.0 03/10/2014   Echo: 03/10/2014 Conclusions - Left ventricle: The cavity size was normal. Wall thickness was increased in a pattern of mild LVH. Systolic function was normal. The estimated ejection fraction was in the range of 55% to 60%. - Aortic valve: Heavily calcirfied right coronary cusp. There was mild stenosis. There was mild regurgitation. - Mitral valve: There was mild regurgitation. - Left atrium: The atrium was mildly to moderately dilated at 45 mm  - Right atrium: The atrium was mildly dilated. - Atrial septum: No defect or patent foramen ovale was identified.  ECG:  04/08/2014 Atrial fibrillation, rapid ventricular response Rate 144   ASSESSMENT AND PLAN:  Principal Problem:   Atrial flutter with rapid ventricular response - initiate rate control with IV Cardizem. Titrated as needed to maintain heart rate less than 110. Once heart rate has stabilized, convert to oral medication. TEE/DCCV can be performed, will have EP evaluate. May need antiarrhythmic.   Active Problems:   Malignant neoplasm of gastric fundus - needs an EGD with biopsy for diagnosis. We'll contact Dr. Hilarie Fredrickson and try to arrange this for tomorrow.    Anticoagulation - will start heparin for now. If the EGD can be performed tomorrow, continue heparin until after the procedure. Then start oral anticoagulation when okay with GI. He has tolerated Eliquis in the past.    EtOH use - with the need for anticoagulation, cessation is advised.    Throat discomfort and 1 episode R chest discomfort - in the setting of RVR, has multiple cardiac risk factors. Once patient has stabilized, may need stress test, especially if GI surgery planned.  Otherwise, continue home medications.  Jonetta Speak, PA-C 04/08/2014 3:52 PM Beeper [phone]    Agree with note by Rosaria Ferries PA-C  Pt s/p recent Aflutter Abl by Dr. Caryl Comes. He was on Eliquis A/C and Atenolol prior to Ablation which was D/Cd on discharge ( he was in NSR prior to the ablation) . Nl LV Fxn by 2D with borderline LAE. He has noticed palpitations, rapid HR with irregularity since. Abd CT showed thickening of gastric wall and pt was scheduled for EGD and prob Bx. Today he was found to be in AF with RVR. He does have +CRF including long H/O tobacco abuse, HTN, hyperlipidemia. Exam benign. Plan to rate control with IV dilt, A/C with IV hep. Stop A/C prior to EGD tomorrow. I suspect he will have Bx. Can re-start IV hep after BX (When GI feels it's safe) then arrange TEE DCCV. Will let EP service know pt is back in AF may require anti  arrhythmic medication. If he requires surgery, would get  Lexiscan to risk stratify him. I have discussed the case with Dr. Rayann Heman who will make Dr. Caryl Comes aware.  Lorretta Harp, M.D., [place], [facility], [place] [address] [place], [place]  [address]  [phone] 04/08/2014 4:45 PM

## 2014-04-08 NOTE — Progress Notes (Signed)
Pt received into room 2w16, pt oriented to room and call bell, tele placed on pt Rickard Rhymes, RN

## 2014-04-08 NOTE — Progress Notes (Signed)
ANTICOAGULATION CONSULT NOTE - Initial Consult  Pharmacy Consult for Heparin Indication: atrial fibrillation  No Known Allergies  Patient Measurements: Height: 5\' 9"  (175.3 cm) Weight: 212 lb 1.3 oz (96.2 kg) IBW/kg (Calculated) : 70.7 Heparin Dosing Weight: 90 kg  Vital Signs: Temp: 97.4 F (36.3 C) (11/24 1432) Temp Source: Oral (11/24 1432) BP: 130/90 mmHg (11/24 1432) Pulse Rate: 50 (11/24 1432)  Labs: No results for input(s): HGB, HCT, PLT, APTT, LABPROT, INR, HEPARINUNFRC, CREATININE, CKTOTAL, CKMB, TROPONINI in the last 72 hours.  Estimated Creatinine Clearance: 68.5 mL/min (by C-G formula based on Cr of 1).   Medical History: Past Medical History  Diagnosis Date  . Hypertension   . Sinus bradycardia   . ED (erectile dysfunction)   . Arthropathy, unspecified, site unspecified   . Prostatitis, unspecified   . Personal history of other diseases of digestive system   . Allergic rhinitis due to pollen   . Benign neoplasm of colon   . Diverticulosis   . Skin cancer     basal and squamous cell  . Abnormal CT scan, stomach     a. Profound thickening of the fundus and cardia of the stomach, suspicious for potential neoplasm by CT 02/2014. Referred to GI.  Marland Kitchen Atherosclerosis     a. Noted by abdominal CT 02/2014 (h/o normal nuc 2011).  . Habitual alcohol use   . Snoring     a. Pending OSA eval by pulm.  . Hyperlipidemia   . Obesity   . Shortness of breath 12/2013  . Atrial flutter 02/24/14    s/p ablation 11/15  . GERD (gastroesophageal reflux disease)     Medications:  Prescriptions prior to admission  Medication Sig Dispense Refill Last Dose  . atorvastatin (LIPITOR) 40 MG tablet Take 1 tablet (40 mg total) by mouth daily. 90 tablet 3 04/07/2014 at Unknown time  . finasteride (PROSCAR) 5 MG tablet Take 1 tablet (5 mg total) by mouth daily. 30 tablet 11 04/08/2014 at Unknown time  . fluticasone (FLONASE) 50 MCG/ACT nasal spray Place 1 spray into both nostrils  daily as needed for allergies.   unknown  . furosemide (LASIX) 20 MG tablet Take 20 mg by mouth daily.   04/07/2014 at Unknown time  . loratadine (CLARITIN) 10 MG tablet Take 10 mg by mouth daily.    unknown  . losartan (COZAAR) 100 MG tablet Take 1 tablet (100 mg total) by mouth daily. 90 tablet 3 04/07/2014 at Unknown time  . Multiple Vitamins-Minerals (EYE VITAMINS PO) Take 1 tablet by mouth 2 (two) times daily. Macular Supplement   04/07/2014 at Unknown time  . Multiple Vitamins-Minerals (MULTIVITAMIN GUMMIES ADULT PO) Take 1 tablet by mouth 2 (two) times daily.   04/07/2014 at Unknown time  . OVER THE COUNTER MEDICATION Take 1 tablet by mouth daily as needed (for stomach acid. OTC Acid Reducer).   unknown   Scheduled:  . atorvastatin  40 mg Oral Daily  . diltiazem  15 mg Intravenous Once  . diltiazem  15 mg Intravenous Once  . EYE VITAMINS  1 capsule Oral BID  . finasteride  5 mg Oral Daily  . furosemide  20 mg Oral Daily  . loratadine  10 mg Oral Daily  . losartan  100 mg Oral Daily  . pantoprazole  40 mg Oral Q0600   Infusions:  . diltiazem (CARDIZEM) infusion      Assessment: 80yo male presents for evaluation of potential stomach neoplasm. Pharmacy is consulted to dose heparin  while patient is off of Eliquis. CBC is normal.  Pt is set up for EGD 11/25 at 1130 and will need to stop heparin drip at 0530.  Goal of Therapy:  Heparin level 0.3-0.7 units/ml Monitor platelets by anticoagulation protocol: Yes   Plan:  Give 4000 units bolus x 1 Start heparin infusion at 1250 units/hr Check anti-Xa level in 8 hours and daily while on heparin Continue to monitor H&H and platelets   Andrey Cota. Diona Foley, PharmD Clinical Pharmacist Pager [phone] 04/08/2014,4:34 PM

## 2014-04-09 ENCOUNTER — Encounter (HOSPITAL_COMMUNITY)
Admission: AD | Disposition: A | Discharge status: Home / Self Care | Payer: Self-pay | Source: Ambulatory Visit | Attending: Cardiovascular Disease

## 2014-04-09 ENCOUNTER — Inpatient Hospital Stay (HOSPITAL_COMMUNITY): Payer: Medicare Other | Admitting: Anesthesiology

## 2014-04-09 ENCOUNTER — Encounter (HOSPITAL_COMMUNITY): Payer: Self-pay

## 2014-04-09 DIAGNOSIS — K2990 Gastroduodenitis, unspecified, without bleeding: Secondary | ICD-10-CM

## 2014-04-09 DIAGNOSIS — K222 Esophageal obstruction: Secondary | ICD-10-CM

## 2014-04-09 DIAGNOSIS — K2970 Gastritis, unspecified, without bleeding: Secondary | ICD-10-CM

## 2014-04-09 DIAGNOSIS — R933 Abnormal findings on diagnostic imaging of other parts of digestive tract: Secondary | ICD-10-CM

## 2014-04-09 HISTORY — PX: ESOPHAGOGASTRODUODENOSCOPY: SHX5428

## 2014-04-09 LAB — OCCULT BLOOD X 1 CARD TO LAB, STOOL: Fecal Occult Bld: NEGATIVE

## 2014-04-09 LAB — HEPARIN LEVEL (UNFRACTIONATED)
Heparin Unfractionated: 0.28 IU/mL — ABNORMAL LOW (ref 0.30–0.70)
Heparin Unfractionated: <0.1 IU/mL — ABNORMAL LOW (ref 0.30–0.70)

## 2014-04-09 LAB — TROPONIN I: Troponin I: <0.3 ng/mL (ref <0.30)

## 2014-04-09 SURGERY — EGD (ESOPHAGOGASTRODUODENOSCOPY)
Anesthesia: Monitor Anesthesia Care

## 2014-04-09 MED ORDER — METOPROLOL TARTRATE 12.5 MG HALF TABLET
12.5000 mg | ORAL_TABLET | Freq: Two times a day (BID) | ORAL | Status: DC
  Administered 2014-04-09 – 2014-04-10 (×3): 12.5 mg via ORAL
  Filled 2014-04-09 (×6): qty 1

## 2014-04-09 MED ORDER — LOSARTAN POTASSIUM 50 MG PO TABS
50.0000 mg | ORAL_TABLET | Freq: Every day | ORAL | Status: DC
  Administered 2014-04-10: 50 mg via ORAL
  Filled 2014-04-09: qty 1

## 2014-04-09 MED ORDER — LACTATED RINGERS IV SOLN
INTRAVENOUS | Status: DC
  Administered 2014-04-09: 14:00:00 via INTRAVENOUS

## 2014-04-09 MED ORDER — LACTATED RINGERS IV SOLN
INTRAVENOUS | Status: DC | PRN
  Administered 2014-04-09: 14:00:00 via INTRAVENOUS

## 2014-04-09 MED ORDER — PROPOFOL 10 MG/ML IV BOLUS
INTRAVENOUS | Status: DC | PRN
  Administered 2014-04-09 (×3): 40 mg via INTRAVENOUS
  Administered 2014-04-09: 80 mg via INTRAVENOUS

## 2014-04-09 MED ORDER — MIDAZOLAM HCL 5 MG/5ML IJ SOLN
INTRAMUSCULAR | Status: DC | PRN
  Administered 2014-04-09: 1 mg via INTRAVENOUS

## 2014-04-09 NOTE — Anesthesia Postprocedure Evaluation (Signed)
Anesthesia Post Note  Patient: Donald [name]  Procedure(s) Performed: Procedure(s) (LRB): ESOPHAGOGASTRODUODENOSCOPY (EGD) (N/A)  Anesthesia type: MAC  Patient location: PACU  Post pain: Pain level controlled  Post assessment: Patient's Cardiovascular Status Stable  Last Vitals:  Filed Vitals:   04/09/14 1440  BP: 118/79  Pulse: 57  Temp:   Resp: 22    Post vital signs: Reviewed and stable  Level of consciousness: sedated  Complications: No apparent anesthesia complications

## 2014-04-09 NOTE — Progress Notes (Signed)
Utilization review completed.  

## 2014-04-09 NOTE — Transfer of Care (Signed)
Immediate Anesthesia Transfer of Care Note  Patient: Donald [name]  Procedure(s) Performed: Procedure(s): ESOPHAGOGASTRODUODENOSCOPY (EGD) (N/A)  Patient Location: Endoscopy Unit  Anesthesia Type:MAC  Level of Consciousness: awake, alert  and oriented  Airway & Oxygen Therapy: Patient Spontanous Breathing and Patient connected to nasal cannula oxygen  Post-op Assessment: Report given to PACU RN, Post -op Vital signs reviewed and stable and Patient moving all extremities X 4  Post vital signs: Reviewed and stable  Complications: No apparent anesthesia complications

## 2014-04-09 NOTE — Progress Notes (Signed)
04/09/2014 11:01 PM The patient, who was admitted with A Fib, converted to sinus bradycardia at a heart rate of 52.  He was receiving Cardizem at 15 mg per hour IV.  I called Dr. Ahmed Prima and informed her of the patient's conversion and asked if she still wanted him on the IV Cardizem. Dr. Ahmed Prima instructed to titrate the Cardizem down to 10 mg per hour and see if we can keep his heart rate at 55 or more at sinus rhythm.  The patient is now receiving Cardizem at 10 mg an hour and his heart rate is currently 56.  Will continue to monitor the patient. Lupita Dawn

## 2014-04-09 NOTE — Progress Notes (Signed)
ANTICOAGULATION CONSULT NOTE - Follow Up Consult  Pharmacy Consult for Heparin  Indication: atrial fibrillation  No Known Allergies  Patient Measurements: Height: 5\' 9"  (175.3 cm) Weight: 212 lb 1.3 oz (96.2 kg) IBW/kg (Calculated) : 70.7  Vital Signs: Temp: 98.2 F (36.8 C) (11/24 2022) Temp Source: Oral (11/24 2022) BP: 117/91 mmHg (11/24 2246) Pulse Rate: 127 (11/24 2246)  Labs:  Recent Labs  04/08/14 1657 04/08/14 2217 04/09/14 0013  APTT 31  --   --   LABPROT 15.3*  --   --   INR 1.19  --   --   HEPARINUNFRC  --   --  0.28*  CREATININE 0.87  --   --   TROPONINI <0.30 <0.30  --     Estimated Creatinine Clearance: 78.8 mL/min (by C-G formula based on Cr of 0.87).  Assessment: Slightly sub-therapeutic heparin level, other labs as above.   Goal of Therapy:  Heparin level 0.3-0.7 units/ml Monitor platelets by anticoagulation protocol: Yes   Plan:  -Increase heparin to 1350 units/hr, heparin drip to stop at 0530 per GI note to allow for biopsy -F/U timing of heparin re-start after EGD, order heparin level for 1000 if EGD is cancelled for some reason -Daily CBC/HL -Monitor for bleeding  Narda Bonds 04/09/2014,1:55 AM

## 2014-04-09 NOTE — Progress Notes (Signed)
ANTICOAGULATION CONSULT NOTE - Follow Up Consult  Pharmacy Consult for Heparin Indication: Afib/flutter  No Known Allergies  Patient Measurements: Height: 5\' 9"  (175.3 cm) Weight: 205 lb 14.6 oz (93.4 kg) IBW/kg (Calculated) : 70.7 Heparin Dosing Weight: 90kg  Vital Signs: Temp: 98.1 F (36.7 C) (11/25 1327) Temp Source: Oral (11/25 1327) BP: 118/79 mmHg (11/25 1440) Pulse Rate: 57 (11/25 1440)  Labs:  Recent Labs  04/08/14 1657 04/08/14 2217 04/09/14 0013 04/09/14 0410 04/09/14 0935  APTT 31  --   --   --   --   LABPROT 15.3*  --   --   --   --   INR 1.19  --   --   --   --   HEPARINUNFRC  --   --  0.28*  --  <0.10*  CREATININE 0.87  --   --   --   --   TROPONINI <0.30 <0.30  --  <0.30  --     Estimated Creatinine Clearance: 77.7 mL/min (by C-G formula based on Cr of 0.87).  Assessment: 81yom on heparin for afib/flutter. Heparin was stopped this AM for EGD. Per GI, ok to resume heparin now. Heparin level this AM was 0.28 so rate was increased to 1350 units/hr - will restart and check 8 hour follow-up level.  - No CBC - No significant bleeding reported  Goal of Therapy:  Heparin level 0.3-0.7 units/ml Monitor platelets by anticoagulation protocol: Yes   Plan:  1. Restart heparin drip 1350 units/hr (13.5 ml/hr) 2. Check heparin level 8 hours after restart 3. Daily heparin level and CBC 4. Follow-up restart of oral anticoagulation (Eliquis)  Earleen Newport  [phone] 04/09/2014,4:45 PM

## 2014-04-09 NOTE — Plan of Care (Signed)
Problem: Phase I Progression Outcomes Goal: Anticoagulation Therapy per MD order Outcome: Progressing

## 2014-04-09 NOTE — Op Note (Signed)
[facility] [place], [id number]   ENDOSCOPY PROCEDURE REPORT  PATIENT: Donald [name], Donald [name]  MR#: [id number] BIRTHDATE: 09-27-1932 , 44  yrs. old GENDER: male ENDOSCOPIST: Inda Castle, MD REFERRED BY: PROCEDURE DATE:  04/09/2014 PROCEDURE:  EGD w/ biopsy ASA CLASS:     Class III INDICATIONS:  abnormal CT of the GI tract. MEDICATIONS: Monitored anesthesia care TOPICAL ANESTHETIC:  DESCRIPTION OF PROCEDURE: After the risks benefits and alternatives of the procedure were thoroughly explained, informed consent was obtained.  The Pentax Gastroscope [id number] endoscope was introduced through the mouth and advanced to the second portion of the duodenum , Without limitations.  The instrument was slowly withdrawn as the mucosa was fully examined.    ESOPHAGUS: There was a benign appearing stricture at the gastroesophageal junction.  The stricture was traversable with resistance.   A small sliding hiatal hernia was present.   Except for the findings listed the EGD was otherwise normal.   STOMACH: Chronic gastritis (inflammation) was found in the cardia.  There was very mild subtle edema just beyond the GE junction in the cardia.  Biopsies were taken.Retroflexed views revealed as previously described.     The scope was then withdrawn from the patient and the procedure completed.  COMPLICATIONS: There were no immediate complications.  ENDOSCOPIC IMPRESSION: 1.  esophageal stricture 2.  Sliding hiatal hernia  RECOMMENDATIONS: 1.  dilation therapy if patient complains of dysphagia 2.  PPI therapy 3.  Await biopsy findings  REPEAT EXAM:  eSigned:  Inda Castle, MD 04/09/2014 2:30 PM    CC: Zenovia Jarred, MD

## 2014-04-09 NOTE — Progress Notes (Signed)
Called by RN because patient had converted spontaneously to sinus rhythm, heart rate now 52. Cardizem is currently running at 15 mg an hour.  Ideally, we would keep the Cardizem IV going tonight. He has had problems with bradycardia in the past and the preference would be to keep the IV medication going to titrate that to maintain his heart rate greater than or equal to 55. In a.m., if he is able to tolerate the Cardizem, can converted to by mouth.   We'll decrease the Cardizem to 10 mg/h and follow heart rate/blood pressure. If he does become significantly bradycardic, the IV medication can be discontinued.  Follow-up in a.m. with plan to convert medication to oral form.  Rosaria Ferries, PA-C 04/09/2014 7:44 PM Beeper [phone]

## 2014-04-09 NOTE — Interval H&P Note (Signed)
History and Physical Interval Note:  04/09/2014 1:54 PM  Donald [name]  has presented today for surgery, with the diagnosis of abnormal looking stomach on CT scan.  The various methods of treatment have been discussed with the patient and family. After consideration of risks, benefits and other options for treatment, the patient has consented to  Procedure(s): ESOPHAGOGASTRODUODENOSCOPY (EGD) (N/A) as a surgical intervention .  The patient's history has been reviewed, patient examined, no change in status, stable for surgery.  I have reviewed the patient's chart and labs.  Questions were answered to the patient's satisfaction.    The recent H&P (dated *04/08/14**) was reviewed, the patient was examined and there is no change in the patients condition since that H&P was completed.   Erskine Emery  04/09/2014, 1:54 PM    Erskine Emery

## 2014-04-09 NOTE — Plan of Care (Signed)
Problem: Phase I Progression Outcomes Goal: Heart rate or rhythm control medication Outcome: Progressing

## 2014-04-09 NOTE — Anesthesia Preprocedure Evaluation (Addendum)
Anesthesia Evaluation  Patient identified by MRN, date of birth, ID band Patient awake    Reviewed: Allergy & Precautions, H&P , NPO status   History of Anesthesia Complications Negative for: history of anesthetic complications  Airway Mallampati: II  TM Distance: >3 FB Neck ROM: Full    Dental  (+) Teeth Intact, Dental Advisory Given   Pulmonary former smoker,    Pulmonary exam normal       Cardiovascular hypertension, Pt. on medications + Peripheral Vascular Disease + dysrhythmias Atrial Fibrillation     Neuro/Psych negative neurological ROS  negative psych ROS   GI/Hepatic Neg liver ROS, GERD-  ,  Endo/Other  negative endocrine ROS  Renal/GU negative Renal ROS     Musculoskeletal   Abdominal   Peds  Hematology   Anesthesia Other Findings   Reproductive/Obstetrics                           Anesthesia Physical Anesthesia Plan  ASA: III  Anesthesia Plan: MAC   Post-op Pain Management:    Induction: Intravenous  Airway Management Planned: Simple Face Mask  Additional Equipment:   Intra-op Plan:   Post-operative Plan:   Informed Consent: I have reviewed the patients History and Physical, chart, labs and discussed the procedure including the risks, benefits and alternatives for the proposed anesthesia with the patient or authorized representative who has indicated his/her understanding and acceptance.   Dental advisory given  Plan Discussed with: CRNA, Anesthesiologist and Surgeon  Anesthesia Plan Comments:        Anesthesia Quick Evaluation

## 2014-04-09 NOTE — H&P (View-Only) (Signed)
Pt is set up for EGD with Dr Deatra Ina at 11:30 tomorrow.  Will need to stop the heparin drip at 5:30 AM tomorrow to allow for biopsy.  Ok to eat solids tonite, then NPO post midnite. Will only proceed to EGD if rate is controlled.   Azucena Freed PA-C

## 2014-04-09 NOTE — Consult Note (Signed)
Reason for Consult: Atrial fibrillation with rapid ventricular response Referring Physician: [name] is an 80 y.o. male.  HPI:  The patient is a 80 y.o. male with a history of sinus bradycardia, atrial flutter, Bicuspid AV, HTN, HLD, ETOH use . He had atrial flutter ablation on 11/04 and was discharged on 11/05, in sinus rhythm.  No known CAD. Normal stress test on 02/20/14.  Echo on 03/10/14 revealed EF of 55-60%(Improved from TEE on 02/24/14 which was 30%), Mild AI, MR.  LA mild to mod dilation. RA mildly dilated.     He takes his blood pressure and heart rate daily. After discharge, his blood pressure was fairly steady and his heart rate was less than 75 every time it was checked until Sunday, 11/15. That day, his heart rate was 144.  All subsequent checks were <90.   He has not been on a beta blocker or anticoagulated with Eliquis since discharge.   He is being evaluated for potential stomach neoplasm. He was scheduled for an EGD and came to the office  today for the procedure. Upon arrival, his heart rate was 166 and the procedure was canceled. He was admitted and started on IV diltiazem and heparin.  He is asymptomatic currently.  He had some discomfort at the base of his neck, anteriorly at one point but nothing in the last 24 hrs.   His EGD was rescheduled for today at 1130 hrs.  The patient currently denies nausea, vomiting, fever, chest pain, shortness of breath, orthopnea, dizziness, PND, cough, congestion, abdominal pain, hematochezia, melena, lower extremity edema.   Past Medical History  Diagnosis Date  . Hypertension   . Sinus bradycardia   . ED (erectile dysfunction)   . Arthropathy, unspecified, site unspecified   . Prostatitis, unspecified   . Personal history of other diseases of digestive system   . Allergic rhinitis due to pollen   . Benign neoplasm of colon   . Diverticulosis   . Skin cancer     basal and squamous cell  . Abnormal CT scan, stomach       a. Profound thickening of the fundus and cardia of the stomach, suspicious for potential neoplasm by CT 02/2014. Referred to GI.  Marland Kitchen Atherosclerosis     a. Noted by abdominal CT 02/2014 (h/o normal nuc 2011).  . Habitual alcohol use   . Snoring     a. Pending OSA eval by pulm.  . Hyperlipidemia   . Obesity   . Shortness of breath 12/2013  . Atrial flutter 02/24/14    s/p ablation 11/15  . GERD (gastroesophageal reflux disease)     Past Surgical History  Procedure Laterality Date  . Inguinal hernia repair      right  . Inguinal hernia repair      left  . Tonsillectomy    . Orif fracture of the elbow    . Squamous cell carcinoma excision    . Skin cancer excision  05/21/12    Squamous cell ca  . Hernia repair    . Tee without cardioversion N/A 02/24/2014    Procedure: TRANSESOPHAGEAL ECHOCARDIOGRAM (TEE);  Surgeon: Lelon Perla, MD;  Location: [facility] ENDOSCOPY;  Service: Cardiovascular;  Laterality: N/A;  . Cardioversion N/A 02/24/2014    Procedure: CARDIOVERSION;  Surgeon: Lelon Perla, MD;  Location: [facility] ENDOSCOPY;  Service: Cardiovascular;  Laterality: N/A;  . Ablation  03/19/2014    RFCA of atrial flutter by Dr Caryl Comes  Family History  Problem Relation Age of Onset  . Cancer Mother     ovarian  . Other Father     renal disease- grief over loss of spouse  . Cancer Brother     prostate- died of hematologic disorder 2nd to chemo  . Diabetes Neg Hx   . Coronary artery disease Neg Hx   . Colon cancer Neg Hx   . Stomach cancer Neg Hx   . Rectal cancer Neg Hx   . Pulmonary fibrosis Brother     Social History:  reports that he quit smoking about 5 years ago. His smoking use included Cigarettes and Cigars. He smoked 1.00 pack per day. He has never used smokeless tobacco. He reports that he drinks about 25.2 oz of alcohol per week. He reports that he does not use illicit drugs.  Allergies: No Known Allergies  Medications:  Scheduled Meds: . atorvastatin  40 mg  Oral q1800  . finasteride  5 mg Oral Daily  . furosemide  20 mg Oral Daily  . loratadine  10 mg Oral Daily  . losartan  100 mg Oral Daily  . multivitamin-lutein  1 capsule Oral Daily  . pantoprazole  40 mg Oral Q0600   Continuous Infusions: . diltiazem (CARDIZEM) infusion 15 mg/hr (04/09/14 0403)  . heparin 1,350 Units/hr (04/09/14 0310)   PRN Meds:.acetaminophen, ALPRAZolam, fluticasone, nitroGLYCERIN, ondansetron (ZOFRAN) IV, zolpidem   Results for orders placed or performed during the hospital encounter of 04/08/14 (from the past 48 hour(s))  TSH     Status: None   Collection Time: 04/08/14  4:30 PM  Result Value Ref Range   TSH 0.743 0.350 - 4.500 uIU/mL  Comprehensive metabolic panel     Status: Abnormal   Collection Time: 04/08/14  4:57 PM  Result Value Ref Range   Sodium 141 137 - 147 mEq/L   Potassium 4.1 3.7 - 5.3 mEq/L   Chloride 104 96 - 112 mEq/L   CO2 22 19 - 32 mEq/L   Glucose, Bld 86 70 - 99 mg/dL   BUN 16 6 - 23 mg/dL   Creatinine, Ser 0.87 0.50 - 1.35 mg/dL   Calcium 9.5 8.4 - 10.5 mg/dL   Total Protein 7.0 6.0 - 8.3 g/dL   Albumin 3.9 3.5 - 5.2 g/dL   AST 22 0 - 37 U/L   ALT 20 0 - 53 U/L   Alkaline Phosphatase 75 39 - 117 U/L   Total Bilirubin 0.9 0.3 - 1.2 mg/dL   GFR calc non Af Amer 80 (L) >90 mL/min   GFR calc Af Amer >90 >90 mL/min    Comment: (NOTE) The eGFR has been calculated using the CKD EPI equation. This calculation has not been validated in all clinical situations. eGFR's persistently <90 mL/min signify possible Chronic Kidney Disease.    Anion gap 15 5 - 15  Troponin I-(serum)     Status: None   Collection Time: 04/08/14  4:57 PM  Result Value Ref Range   Troponin I <0.30 <0.30 ng/mL    Comment:        Due to the release kinetics of cTnI, a negative result within the first hours of the onset of symptoms does not rule out myocardial infarction with certainty. If myocardial infarction is still suspected, repeat the test at  appropriate intervals.   Protime-INR     Status: Abnormal   Collection Time: 04/08/14  4:57 PM  Result Value Ref Range   Prothrombin Time 15.3 (H) 11.6 -  15.2 seconds   INR 1.19 0.00 - 1.49  APTT     Status: None   Collection Time: 04/08/14  4:57 PM  Result Value Ref Range   aPTT 31 24 - 37 seconds  Troponin I-(serum)     Status: None   Collection Time: 04/08/14 10:17 PM  Result Value Ref Range   Troponin I <0.30 <0.30 ng/mL    Comment:        Due to the release kinetics of cTnI, a negative result within the first hours of the onset of symptoms does not rule out myocardial infarction with certainty. If myocardial infarction is still suspected, repeat the test at appropriate intervals.   Heparin level (unfractionated)     Status: Abnormal   Collection Time: 04/09/14 12:13 AM  Result Value Ref Range   Heparin Unfractionated 0.28 (L) 0.30 - 0.70 IU/mL    Comment:        IF HEPARIN RESULTS ARE BELOW EXPECTED VALUES, AND PATIENT DOSAGE HAS BEEN CONFIRMED, SUGGEST FOLLOW UP TESTING OF ANTITHROMBIN III LEVELS.   Troponin I-(serum)     Status: None   Collection Time: 04/09/14  4:10 AM  Result Value Ref Range   Troponin I <0.30 <0.30 ng/mL    Comment:        Due to the release kinetics of cTnI, a negative result within the first hours of the onset of symptoms does not rule out myocardial infarction with certainty. If myocardial infarction is still suspected, repeat the test at appropriate intervals.   Occult blood card to lab, stool RN will collect     Status: None   Collection Time: 04/09/14  5:51 AM  Result Value Ref Range   Fecal Occult Bld NEGATIVE NEGATIVE    No results found.  Review of Systems  Constitutional: Negative for fever and diaphoresis.  HENT: Negative for congestion.   Respiratory: Negative for cough and shortness of breath.   Cardiovascular: Negative for chest pain, orthopnea, leg swelling and PND.  Gastrointestinal: Negative for nausea, vomiting,  abdominal pain, blood in stool and melena.  Genitourinary: Negative for hematuria.  Musculoskeletal: Positive for neck pain (he had some discomfort at the base of his neck, anteriorly at one point but nothing in the last 24 hrs.).  Neurological: Negative for dizziness and weakness.  All other systems reviewed and are negative.  Blood pressure 104/79, pulse 75, temperature 97.8 F (36.6 C), temperature source Oral, resp. rate 17, height $RemoveBe'5\' 9"'eqlfeEzcu$  (1.753 m), weight 205 lb 14.6 oz (93.4 kg), SpO2 96 %. Physical Exam  Nursing note and vitals reviewed. Constitutional: He is oriented to person, place, and time. He appears well-developed and well-nourished. No distress.  HENT:  Head: Normocephalic and atraumatic.  Eyes: EOM are normal. Pupils are equal, round, and reactive to light. No scleral icterus.  Neck: Normal range of motion. Neck supple.  Cardiovascular: S1 normal and S2 normal.  An irregularly irregular rhythm present. Tachycardia present.   No murmur heard. Pulses:      Radial pulses are 2+ on the right side, and 2+ on the left side.       Dorsalis pedis pulses are 2+ on the right side, and 2+ on the left side.  No Carotid bruit  Respiratory: Effort normal. He has no wheezes. He has no rales.  GI: Soft. Bowel sounds are normal. There is tenderness (Mildly tender in LLQ).  Musculoskeletal: He exhibits no edema.  Lymphadenopathy:    He has no cervical adenopathy.  Neurological: He  is alert and oriented to person, place, and time. He exhibits normal muscle tone.  Skin: Skin is warm and dry.  Psychiatric: He has a normal mood and affect.    Assessment/Plan:    Atrial fibrillation with rapid ventricular response 80 y.o. male with a history of sinus bradycardia and atrial flutter. He had atrial flutter ablation on 11/04 and was discharged on 11/05, in sinus rhythm.  He presented for EGD and was found in Afib RVR 160's.  The patient continues in a fib with better rate control on $RemoveBe'15mg'RsFXATXvm$ /hr IV  dilt but still with some RVR 90-120's.  His BP will not tolerate adding a BB on top of the dilt. We can back off on losartan first.   He had a normal stress test on 02/20/14.  Consider adding Flecainide or multaq then TEE/DCCV.  Hold off on restarting Eliquis until ok by GI.  EGD and biopsy.   Dr. Caryl Comes to follow.     POSSIBLE  Malignant neoplasm of gastric fundus      HAGER, BRYAN, PA-C 04/09/2014, 9:43 AM   The patient has atrial fibrillation with a rapid ventricular response following prior ablation of atrial flutter substrate. This occurred in the context of significant sinus node dysfunction.  His heart rates have been in the 40s on low-dose atenolol at 50 mg a day having prompted [place] to stop it following his ablation.  He now presents with a rapid rate which is quite symptomatic. Rate control without cardioversion initially will allow for further evaluation of his funduscopic-gastric thickening which is concerning for malignancy. This evaluation has been deferred for some weeks because of atrial arrhythmia.  Hence, I recommend that rate control albeit modest that we proceed with this endoscopic evaluation.  Following that we will transition his AV nodal blocking agents to oral and hope to accomplish discharge tomorrow. We will resume anticoagulation when okay with GI. If we can't accomplish adequate rate control and he is asymptomatic, allowing him to stay in atrial fibrillation would obviate the need for pacing. However, if symptoms persist, restoration of sinus rhythm will become important and this will almost certainly necessitate pacing. Notably his QT interval was normal 10/15 and his GFR is normal which would allow [place] to potentially use dofetilide which also might avoid the need for pacing.  I reviewed this with the patient.

## 2014-04-09 NOTE — Progress Notes (Signed)
No significant abnormalities on upper endoscopy.  There is no evidence for gastric mass.  He has a peptic stricture and mild edema at the GE junction.  Recommendations #1 PPI therapy #2 patient follow-up with GI

## 2014-04-09 NOTE — Plan of Care (Signed)
Problem: Phase I Progression Outcomes Goal: Ventricular heart rate < 120/min Outcome: Progressing

## 2014-04-09 NOTE — Anesthesia Procedure Notes (Signed)
Procedure Name: MAC Date/Time: 04/09/2014 2:04 PM Performed by: Neldon Newport Pre-anesthesia Checklist: Timeout performed, Patient being monitored, Suction available, Emergency Drugs available and Patient identified Oxygen Delivery Method: Nasal cannula Placement Confirmation: positive ETCO2

## 2014-04-10 ENCOUNTER — Other Ambulatory Visit: Payer: Self-pay | Admitting: Physician Assistant

## 2014-04-10 ENCOUNTER — Encounter (HOSPITAL_COMMUNITY): Payer: Self-pay | Admitting: Physician Assistant

## 2014-04-10 DIAGNOSIS — I480 Paroxysmal atrial fibrillation: Secondary | ICD-10-CM

## 2014-04-10 LAB — HEPARIN LEVEL (UNFRACTIONATED)
HEPARIN UNFRACTIONATED: 0.16 [IU]/mL — AB (ref 0.30–0.70)
Heparin Unfractionated: 0.5 IU/mL (ref 0.30–0.70)

## 2014-04-10 LAB — CBC
HEMATOCRIT: 40.7 % (ref 39.0–52.0)
HEMOGLOBIN: 13.9 g/dL (ref 13.0–17.0)
MCH: 32 pg (ref 26.0–34.0)
MCHC: 34.2 g/dL (ref 30.0–36.0)
MCV: 93.8 fL (ref 78.0–100.0)
Platelets: 176 10*3/uL (ref 150–400)
RBC: 4.34 MIL/uL (ref 4.22–5.81)
RDW: 12.8 % (ref 11.5–15.5)
WBC: 7.1 10*3/uL (ref 4.0–10.5)

## 2014-04-10 MED ORDER — DILTIAZEM HCL ER COATED BEADS 240 MG PO CP24
240.0000 mg | ORAL_CAPSULE | Freq: Every day | ORAL | Status: DC
  Administered 2014-04-10: 240 mg via ORAL
  Filled 2014-04-10: qty 1

## 2014-04-10 MED ORDER — PANTOPRAZOLE SODIUM 40 MG PO TBEC: 40.0000 mg | DELAYED_RELEASE_TABLET | Freq: Every day | ORAL | Status: DC

## 2014-04-10 MED ORDER — METOPROLOL TARTRATE 25 MG PO TABS: 12.5000 mg | ORAL_TABLET | Freq: Two times a day (BID) | ORAL | Status: DC

## 2014-04-10 MED ORDER — APIXABAN 5 MG PO TABS: 5.0000 mg | ORAL_TABLET | Freq: Two times a day (BID) | ORAL | Status: DC

## 2014-04-10 MED ORDER — LOSARTAN POTASSIUM 100 MG PO TABS: 50.0000 mg | ORAL_TABLET | Freq: Every day | ORAL | Status: DC

## 2014-04-10 MED ORDER — DILTIAZEM HCL ER COATED BEADS 240 MG PO CP24: 240.0000 mg | ORAL_CAPSULE | Freq: Every day | ORAL | Status: DC

## 2014-04-10 NOTE — Plan of Care (Signed)
Problem: Phase I Progression Outcomes Goal: Heart rate or rhythm control medication Outcome: Completed/Met Date Met:  04/10/14

## 2014-04-10 NOTE — Progress Notes (Signed)
  ANTICOAGULATION CONSULT NOTE - Follow Up Consult  Pharmacy Consult for Heparin Indication: Afib/flutter  No Known Allergies  Patient Measurements: Height: 5\' 9"  (175.3 cm) Weight: 204 lb 12.9 oz (92.9 kg) IBW/kg (Calculated) : 70.7 Heparin Dosing Weight: 90kg  Vital Signs: Temp: 97.5 F (36.4 C) (11/26 0530) Temp Source: Oral (11/26 0530) BP: 113/64 mmHg (11/26 0530) Pulse Rate: 55 (11/26 0530)  Labs:  Recent Labs  04/08/14 1657 04/08/14 2217  04/09/14 0410 04/09/14 0935 04/10/14 0025 04/10/14 0852  HGB  --   --   --   --   --  13.9  --   HCT  --   --   --   --   --  40.7  --   PLT  --   --   --   --   --  176  --   APTT 31  --   --   --   --   --   --   LABPROT 15.3*  --   --   --   --   --   --   INR 1.19  --   --   --   --   --   --   HEPARINUNFRC  --   --   < >  --  <0.10* 0.16* 0.50  CREATININE 0.87  --   --   --   --   --   --   TROPONINI <0.30 <0.30  --  <0.30  --   --   --   < > = values in this interval not displayed.  Estimated Creatinine Clearance: 77.5 mL/min (by C-G formula based on Cr of 0.87).   Medications:  Heparin 1500 units/hr  Assessment: 43 yom continues on heparin for Afib/flutter. Heparin was restarted s/p EGD 11/25 and heparin level (0.5) is now therapeutic after rate increase - will continue current rate and check follow-up level to confirm therapeutic.  - H/H and Plts stable - No significant bleeding reported  Goal of Therapy:  Heparin level 0.3-0.7 units/ml Monitor platelets by anticoagulation protocol: Yes   Plan:  1. Continue Heparin drip 1500 units/hr (15 ml/hr) 2. Check follow-up heparin level ~1700 to confirm therapeutic 3. Daily heparin level and CBC 4. Follow-up restart of oral anticoagulation (Eliquis)  Earleen Newport  [phone] 04/10/2014,10:03 AM

## 2014-04-10 NOTE — Plan of Care (Signed)
Problem: Phase I Progression Outcomes Goal: If Ablation, see post EP orders Outcome: Not Applicable Date Met:  93/81/01

## 2014-04-10 NOTE — Plan of Care (Signed)
Problem: Phase I Progression Outcomes Goal: Pain controlled with appropriate interventions Outcome: Completed/Met Date Met:  04/10/14     

## 2014-04-10 NOTE — Progress Notes (Signed)
Patient Name: Donald [name]      SUBJECTIVE: withougcomplaint  Prefers sinus  Past Medical History  Diagnosis Date  . Hypertension   . Sinus bradycardia   . ED (erectile dysfunction)   . Arthropathy, unspecified, site unspecified   . Prostatitis, unspecified   . Personal history of other diseases of digestive system   . Allergic rhinitis due to pollen   . Benign neoplasm of colon   . Diverticulosis   . Skin cancer     basal and squamous cell  . Abnormal CT scan, stomach     a. Profound thickening of the fundus and cardia of the stomach, suspicious for potential neoplasm by CT 02/2014. Referred to GI.  Marland Kitchen Atherosclerosis     a. Noted by abdominal CT 02/2014 (h/o normal nuc 2011).  . Habitual alcohol use   . Snoring     a. Pending OSA eval by pulm.  . Hyperlipidemia   . Obesity   . Shortness of breath 12/2013  . Atrial flutter 02/24/14    s/p ablation 11/15  . GERD (gastroesophageal reflux disease)     Scheduled Meds:  Scheduled Meds: . atorvastatin  40 mg Oral q1800  . finasteride  5 mg Oral Daily  . furosemide  20 mg Oral Daily  . loratadine  10 mg Oral Daily  . losartan  50 mg Oral Daily  . metoprolol tartrate  12.5 mg Oral BID  . multivitamin-lutein  1 capsule Oral Daily  . pantoprazole  40 mg Oral Q0600   Continuous Infusions: . diltiazem (CARDIZEM) infusion 10 mg/hr (04/10/14 0648)  . heparin 1,500 Units/hr (04/10/14 0100)   acetaminophen, ALPRAZolam, fluticasone, nitroGLYCERIN, ondansetron (ZOFRAN) IV, zolpidem    PHYSICAL EXAM Filed Vitals:   04/09/14 1440 04/09/14 1838 04/09/14 2047 04/10/14 0530  BP: 118/79 105/65 112/73 113/64  Pulse: 57 51 54 55  Temp:   98.4 F (36.9 C) 97.5 F (36.4 C)  TempSrc:   Oral Oral  Resp: 22  18 16   Height:      Weight:    204 lb 12.9 oz (92.9 kg)  SpO2: 98%  98% 99%    Well developed and nourished in no acute distress HENT normal Neck supple with JVP-flat Clear Regular rate and rhythm, no  murmurs or gallops Abd-soft with active BS No Clubbing cyanosis edema Skin-warm and dry A & Oriented  Grossly normal sensory and motor function   TELEMETRY: Reviewed telemetry pt in  Sinus in 50s:    Intake/Output Summary (Last 24 hours) at 04/10/14 0841 Last data filed at 04/09/14 2052  Gross per 24 hour  Intake   1080 ml  Output      0 ml  Net   1080 ml    LABS: Basic Metabolic Panel:  Recent Labs Lab 04/08/14 1657  NA 141  K 4.1  CL 104  CO2 22  GLUCOSE 86  BUN 16  CREATININE 0.87  CALCIUM 9.5   Cardiac Enzymes:  Recent Labs  04/08/14 1657 04/08/14 2217 04/09/14 0410  TROPONINI <0.30 <0.30 <0.30   CBC:  Recent Labs Lab 04/10/14 0025  WBC 7.1  HGB 13.9  HCT 40.7  MCV 93.8  PLT 176   PROTIME:  Recent Labs  04/08/14 1657  LABPROT 15.3*  INR 1.19   Liver Function Tests:  Recent Labs  04/08/14 1657  AST 22  ALT 20  ALKPHOS 75  BILITOT 0.9  PROT 7.0  ALBUMIN 3.9  No results for input(s): LIPASE, AMYLASE in the last 72 hours. BNP: BNP (last 3 results) No results for input(s): PROBNP in the last 8760 hours. D-Dimer: No results for input(s): DDIMER in the last 72 hours. Hemoglobin A1C: No results for input(s): HGBA1C in the last 72 hours. Fasting Lipid Panel: No results for input(s): CHOL, HDL, LDLCALC, TRIG, CHOLHDL, LDLDIRECT in the last 72 hours. Thyroid Function Tests:  Recent Labs  04/08/14 1630  TSH 0.743      ASSESSMENT AND PLAN:  Principal Problem:   Atrial fibrillation with rapid ventricular response Active Problems:   Malignant neoplasm of gastric fundus   Esophageal stricture   Gastritis and gastroduodenitis  Spontaneous conversion to sinus Change dilt to po Begin apixoban in am at 5 mg bid \\ok  to discharge Needs 24 hr holter next week F/u SK scheduled 12/07 already    Signed, Virl Axe MD  04/10/2014

## 2014-04-10 NOTE — Discharge Summary (Signed)
Discharge Summary   Patient ID: Donald [name], MRN: [id number], DOB/AGE: 01-21-1933 80 y.o.  Admit date: 04/08/2014 Discharge date: 04/10/2014   Primary Care Physician:  [name]   Primary Cardiologist:  Dr. Virl Axe   Reason for Admission:  Atrial Fibrillation with RVR   Primary Discharge Diagnoses:  Principal Problem:   PAF (paroxysmal atrial fibrillation) Active Problems:   Malignant neoplasm of gastric fundus   Esophageal stricture   Gastritis and gastroduodenitis     Wt Readings from Last 3 Encounters:  04/10/14 204 lb 12.9 oz (92.9 kg)  04/08/14 213 lb (96.616 kg)  03/31/14 213 lb (96.616 kg)    Secondary Discharge Diagnoses:   Past Medical History  Diagnosis Date  . Hypertension   . Sinus bradycardia   . ED (erectile dysfunction)   . Arthropathy, unspecified, site unspecified   . Prostatitis, unspecified   . Personal history of other diseases of digestive system   . Allergic rhinitis due to pollen   . Benign neoplasm of colon   . Diverticulosis   . Skin cancer     basal and squamous cell  . Abnormal CT scan, stomach     a. Profound thickening of the fundus and cardia of the stomach, suspicious for potential neoplasm by CT 02/2014. Referred to GI.  Marland Kitchen Atherosclerosis     a. Noted by abdominal CT 02/2014 (h/o normal nuc 2011).  . Habitual alcohol use   . Snoring     a. Pending OSA eval by pulm.  . Hyperlipidemia   . Obesity   . Shortness of breath 12/2013  . Atrial flutter 02/24/14    s/p ablation 11/15  . GERD (gastroesophageal reflux disease)   . PAF (paroxysmal atrial fibrillation)   . Cardiomyopathy     tachy mediated - a. TEE (10/15):  EF 30%;  b. Echo after NSR restored (10/15):  mild LVH, EF 55-60%, mild AS, mild AI, mild MR, mild to mod LAE, mild RAE      Allergies:   No Known Allergies    Procedures Performed This Admission:    EGD WITH BIOPSY (04/09/14): ENDOSCOPIC IMPRESSION: 1. esophageal stricture 2. Sliding  hiatal hernia RECOMMENDATIONS: 1. dilation therapy if patient complains of dysphagia 2. PPI therapy 3. Await biopsy findings   Hospital Course:  Donald [name] is a 80 y.o. male with a hx of AFlutter and tachycardia mediated cardiomyopathy. EF returned to normal in NSR. He underwent RFCA 03/19/14 with Dr. Virl Axe.   He is being evaluation for a potential gastric neoplasm.  He was scheduled for an EGD on 04/08/2014 but was noted to be in AFib with RVR (HR 166).  He was admitted for further evaluation and management.  Atrial Fibrillation with RVR -  Patient was admitted and placed on IV Diltiazem for rate control and IV Heparin.  Serial CEs remained negative.  He converted spontaneously to NSR.  Patient has had bradycardia in NSR in the past.  He was seen by Dr. Virl Axe.  Diltiazem was changed to po and his Apixaban is to be resumed tomorrow AM (see below - gastric biopsy performed 11/25).  He will be set up for a 24 Holter and FU with Dr. Virl Axe 12/7.  Possible Gastric Neoplasm -  EGD was performed after adequate rate control was achieved by Dr. Deatra Ina.  This demonstrated no significant abnormalities.  There was no evidence for gastric mass.  He had peptic stricture and mild edema at the GE junction.  Biopsy results are pending.  He was placed on PPI therapy and will follow up with GI.    Disposition:  Patient was seen by Dr. Virl Axe this AM and felt to be stable for DC to home.     Discharge Vitals:   Blood pressure 113/64, pulse 58, temperature 97.5 F (36.4 C), temperature source Oral, resp. rate 16, height 5\' 9"  (1.753 m), weight 204 lb 12.9 oz (92.9 kg), SpO2 99 %.   Labs:   Recent Labs  04/10/14 0025  WBC 7.1  HGB 13.9  HCT 40.7  MCV 93.8  PLT 176     Recent Labs  04/08/14 1657  NA 141  K 4.1  CL 104  CO2 22  BUN 16  CREATININE 0.87  CALCIUM 9.5  PROT 7.0  BILITOT 0.9  ALKPHOS 75  ALT 20  AST 22     Recent Labs  04/08/14 1657  04/08/14 2217 04/09/14 0410  TROPONINI <0.30 <0.30 <0.30     Lab Results  Component Value Date   TSH 0.743 04/08/2014     Recent Labs  04/08/14 1657  INR 1.19     Diagnostic Procedures and Studies:  No results found.   Disposition:   Pt is being discharged home today in good condition.  Follow-up Plans & Appointments      Follow-up Information    Follow up with [facility] In 3 days.   Why:  the office will call to arrange a 24 hour holter monitor    Contact information:   [place] [address]       Follow up with Virl Axe, MD On 04/21/2014.   Specialty:  Cardiology   Why:  9 AM   Contact information:   [address] [place] [address] [phone]       Discharge Medications    Medication List    STOP taking these medications        OVER THE COUNTER MEDICATION      TAKE these medications        apixaban 5 MG Tabs tablet  Commonly known as:  ELIQUIS  Take 1 tablet (5 mg total) by mouth 2 (two) times daily. Start in the morning on 04/11/14     atorvastatin 40 MG tablet  Commonly known as:  LIPITOR  Take 1 tablet (40 mg total) by mouth daily.     diltiazem 240 MG 24 hr capsule  Commonly known as:  CARDIZEM CD  Take 1 capsule (240 mg total) by mouth daily.     EYE VITAMINS PO  Take 1 tablet by mouth 2 (two) times daily. Macular Supplement     MULTIVITAMIN GUMMIES ADULT PO  Take 1 tablet by mouth 2 (two) times daily.     finasteride 5 MG tablet  Commonly known as:  PROSCAR  Take 1 tablet (5 mg total) by mouth daily.     fluticasone 50 MCG/ACT nasal spray  Commonly known as:  FLONASE  Place 1 spray into both nostrils daily as needed for allergies.     furosemide 20 MG tablet  Commonly known as:  LASIX  Take 20 mg by mouth daily.     loratadine 10 MG tablet  Commonly known as:  CLARITIN  Take 10 mg by mouth daily.     losartan 100 MG tablet  Commonly known  as:  COZAAR  Take 0.5 tablets (50 mg total) by mouth daily.  metoprolol tartrate 25 MG tablet  Commonly known as:  LOPRESSOR  Take 0.5 tablets (12.5 mg total) by mouth 2 (two) times daily.     pantoprazole 40 MG tablet  Commonly known as:  PROTONIX  Take 1 tablet (40 mg total) by mouth daily at 6 (six) AM.         Outstanding Labs/Studies  1. 24 hour Holter  Duration of Discharge Encounter: Greater than 30 minutes including physician and PA time.  Signed, Richardson Dopp, PA-C   04/10/2014 1:21 PM

## 2014-04-10 NOTE — Plan of Care (Signed)
Problem: Phase I Progression Outcomes Goal: Hemodynamically stable Outcome: Completed/Met Date Met:  04/10/14     

## 2014-04-10 NOTE — Plan of Care (Signed)
Problem: Phase II Progression Outcomes Goal: Ventricular heart rate < 100/min Outcome: Progressing

## 2014-04-10 NOTE — Plan of Care (Signed)
Problem: Phase I Progression Outcomes Goal: Initial discharge plan identified Outcome: Completed/Met Date Met:  04/10/14  Problem: Phase II Progression Outcomes Goal: Ventricular heart rate < 100/min Outcome: Completed/Met Date Met:  04/10/14 Goal: Anticoagulation Therapy per MD order Outcome: Completed/Met Date Met:  04/10/14 Goal: CV Risk Factors identified Outcome: Completed/Met Date Met:  04/10/14 Goal: Pain controlled Outcome: Completed/Met Date Met:  04/10/14 Goal: Progress activity as tolerated unless otherwise ordered Outcome: Completed/Met Date Met:  04/10/14 Goal: Discharge plan established Outcome: Completed/Met Date Met:  04/10/14 Goal: Tolerating diet Outcome: Completed/Met Date Met:  04/10/14

## 2014-04-10 NOTE — Plan of Care (Signed)
Problem: Phase I Progression Outcomes Goal: Ventricular heart rate < 120/min Outcome: Completed/Met Date Met:  04/10/14

## 2014-04-10 NOTE — Progress Notes (Signed)
ANTICOAGULATION CONSULT NOTE - Follow Up Consult  Pharmacy Consult for heparin Indication: atrial fibrillation/flutter  Labs:  Recent Labs  04/08/14 1657 04/08/14 2217 04/09/14 0013 04/09/14 0410 04/09/14 0935 04/10/14 0025  HGB  --   --   --   --   --  13.9  HCT  --   --   --   --   --  40.7  PLT  --   --   --   --   --  176  APTT 31  --   --   --   --   --   LABPROT 15.3*  --   --   --   --   --   INR 1.19  --   --   --   --   --   HEPARINUNFRC  --   --  0.28*  --  <0.10* 0.16*  CREATININE 0.87  --   --   --   --   --   TROPONINI <0.30 <0.30  --  <0.30  --   --      Assessment: 81yo male subtherapeutic on heparin after resumed post EGD.  Goal of Therapy:  Heparin level 0.3-0.7 units/ml   Plan:  Will increase heparin gtt 1-2 units/kg/hr to 1500 units/hr and check level in [place], PharmD, BCPS  04/10/2014,12:55 AM

## 2014-04-10 NOTE — Plan of Care (Signed)
Problem: Phase I Progression Outcomes Goal: Anticoagulation Therapy per MD order Outcome: Completed/Met Date Met:  04/10/14

## 2014-04-11 ENCOUNTER — Encounter: Payer: Self-pay | Admitting: Internal Medicine

## 2014-04-14 ENCOUNTER — Encounter: Payer: Self-pay | Admitting: Gastroenterology

## 2014-04-14 ENCOUNTER — Encounter (HOSPITAL_COMMUNITY): Payer: Self-pay | Admitting: Gastroenterology

## 2014-04-15 ENCOUNTER — Encounter (INDEPENDENT_AMBULATORY_CARE_PROVIDER_SITE_OTHER): Payer: Medicare Other

## 2014-04-15 ENCOUNTER — Encounter: Payer: Self-pay | Admitting: *Deleted

## 2014-04-15 DIAGNOSIS — I480 Paroxysmal atrial fibrillation: Secondary | ICD-10-CM

## 2014-04-15 NOTE — Progress Notes (Signed)
Patient ID: Donald [name], male   DOB: 05/23/1932, 80 y.o.   MRN: [id number] [employer] 24 hour holter monitor applied to patient.

## 2014-04-20 ENCOUNTER — Ambulatory Visit (HOSPITAL_BASED_OUTPATIENT_CLINIC_OR_DEPARTMENT_OTHER): Payer: Medicare Other | Attending: Pulmonary Disease

## 2014-04-20 VITALS — Ht 69.0 in | Wt 206.0 lb

## 2014-04-20 DIAGNOSIS — Z6830 Body mass index (BMI) 30.0-30.9, adult: Secondary | ICD-10-CM | POA: Insufficient documentation

## 2014-04-20 DIAGNOSIS — Z87891 Personal history of nicotine dependence: Secondary | ICD-10-CM | POA: Insufficient documentation

## 2014-04-20 DIAGNOSIS — R0683 Snoring: Secondary | ICD-10-CM | POA: Diagnosis present

## 2014-04-20 DIAGNOSIS — G4733 Obstructive sleep apnea (adult) (pediatric): Secondary | ICD-10-CM | POA: Diagnosis not present

## 2014-04-21 ENCOUNTER — Encounter: Payer: Self-pay | Admitting: Internal Medicine

## 2014-04-21 ENCOUNTER — Ambulatory Visit (INDEPENDENT_AMBULATORY_CARE_PROVIDER_SITE_OTHER): Payer: Medicare Other | Admitting: Internal Medicine

## 2014-04-21 VITALS — BP 126/74 | HR 58 | Ht 69.0 in | Wt 217.8 lb

## 2014-04-21 DIAGNOSIS — I480 Paroxysmal atrial fibrillation: Secondary | ICD-10-CM

## 2014-04-21 DIAGNOSIS — I4892 Unspecified atrial flutter: Secondary | ICD-10-CM

## 2014-04-21 NOTE — Patient Instructions (Signed)
Your physician has recommended you make the following change in your medication:  1) CHANGE Furosemide to 40 mg every other day. 2) You may take 1 extra Diltiazem if you experience increase heart rate (rapid sustained AFib).  Your physician recommends that you schedule a follow-up appointment in: 4 months with Dr. Caryl Comes.

## 2014-04-21 NOTE — Progress Notes (Signed)
Patient Care Team: Janith Lima, MD as PCP - General (Internal Medicine) Jorja Loa, MD (Urology) [name] [name], MD (Dermatology) Darlin Coco, MD (Cardiology) Johna Sheriff, MD as Consulting Physician (Ophthalmology)   HPI  Donald [name] is a 80 y.o. male Seem in followup of ablation of atrial flutter and atrial fibrillation;  He converted spontaneously to sinus.  Notably he has been prone to bradycardia in the past (sinus at 40)  Echo - Left ventricle: The cavity size was normal. Wall thickness was increased in a pattern of mild LVH. Systolic function was normal. The estimated ejection fraction was in the range of 55% to 60%.<<30% (10/15)  Aortic valve: Heavily calcirfied right coronary cusp. There was mild stenosis. There was mild regurgitation.BICUSPID - Mitral valve: There was mild regurgitation. - Left atrium: The atrium was mildly to moderately dilated. - Right atrium: The atrium was mildly dilated. - Atrial septum: No defect or patent foramen ovale was identified.  In the interval Holter monitor demonstrated short runs of atrial tachycardia nocturnal bradycardia nadir to 39; daytime bradycardia in the 40s  He has been feeling quite well with stable heart rate and blood pressure and reasonable exercise tolerance. He has noted however that his weight has gone up about 5 pounds.   There has been an issue of fundal thickening concerning for malignancy and underwent endoscopy and biopsy>>NO MALGINANCY  Past Medical History  Diagnosis Date  . Hypertension   . Sinus bradycardia   . ED (erectile dysfunction)   . Arthropathy, unspecified, site unspecified   . Prostatitis, unspecified   . Personal history of other diseases of digestive system   . Allergic rhinitis due to pollen   . Benign neoplasm of colon   . Diverticulosis   . Skin cancer     basal and squamous cell  . Abnormal CT scan, stomach     a. Profound thickening of the fundus and  cardia of the stomach, suspicious for potential neoplasm by CT 02/2014. Referred to GI.  Marland Kitchen Atherosclerosis     a. Noted by abdominal CT 02/2014 (h/o normal nuc 2011).  . Habitual alcohol use   . Snoring     a. Pending OSA eval by pulm.  . Hyperlipidemia   . Obesity   . Shortness of breath 12/2013  . Atrial flutter 02/24/14    s/p ablation 11/15  . GERD (gastroesophageal reflux disease)   . PAF (paroxysmal atrial fibrillation)   . Cardiomyopathy     tachy mediated - a. TEE (10/15):  EF 30%;  b. Echo after NSR restored (10/15):  mild LVH, EF 55-60%, mild AS, mild AI, mild MR, mild to mod LAE, mild RAE    Past Surgical History  Procedure Laterality Date  . Inguinal hernia repair      right  . Inguinal hernia repair      left  . Tonsillectomy    . Orif fracture of the elbow    . Squamous cell carcinoma excision    . Skin cancer excision  05/21/12    Squamous cell ca  . Hernia repair    . Tee without cardioversion N/A 02/24/2014    Procedure: TRANSESOPHAGEAL ECHOCARDIOGRAM (TEE);  Surgeon: Lelon Perla, MD;  Location[facility] ENDOSCOPY;  Service: Cardiovascular;  Laterality: N/A;  . Cardioversion N/A 02/24/2014    Procedure: CARDIOVERSION;  Surgeon: Lelon Perla, MD;  Location: [facility] ENDOSCOPY;  Service: Cardiovascular;  Laterality: N/A;  . Ablation  03/19/2014  RFCA of atrial flutter by Dr Caryl Comes  . Esophagogastroduodenoscopy N/A 04/09/2014    Procedure: ESOPHAGOGASTRODUODENOSCOPY (EGD);  Surgeon: Inda Castle, MD;  Location: [facility];  Service: Endoscopy;  Laterality: N/A;    Current Outpatient Prescriptions  Medication Sig Dispense Refill  . apixaban (ELIQUIS) 5 MG TABS tablet Take 1 tablet (5 mg total) by mouth 2 (two) times daily. Start in the morning on 04/11/14 60 tablet 11  . atorvastatin (LIPITOR) 40 MG tablet Take 1 tablet (40 mg total) by mouth daily. 90 tablet 3  . diltiazem (CARDIZEM CD) 240 MG 24 hr capsule Take 1 capsule (240 mg total) by mouth daily. 30  capsule 6  . finasteride (PROSCAR) 5 MG tablet Take 1 tablet (5 mg total) by mouth daily. 30 tablet 11  . fluticasone (FLONASE) 50 MCG/ACT nasal spray Place 1 spray into both nostrils daily as needed for allergies.    . furosemide (LASIX) 20 MG tablet Take 20 mg by mouth daily.    Marland Kitchen loratadine (CLARITIN) 10 MG tablet Take 10 mg by mouth daily.     Marland Kitchen losartan (COZAAR) 100 MG tablet Take 0.5 tablets (50 mg total) by mouth daily. (Patient taking differently: Take 100 mg by mouth daily. ) 90 tablet 3  . metoprolol tartrate (LOPRESSOR) 25 MG tablet Take 0.5 tablets (12.5 mg total) by mouth 2 (two) times daily. 30 tablet 11  . Multiple Vitamins-Minerals (EYE VITAMINS PO) Take 1 tablet by mouth 2 (two) times daily. Macular Supplement    . Multiple Vitamins-Minerals (MULTIVITAMIN GUMMIES ADULT PO) Take 1 tablet by mouth 2 (two) times daily.    . pantoprazole (PROTONIX) 40 MG tablet Take 1 tablet (40 mg total) by mouth daily at 6 (six) AM. 30 tablet 3   No current facility-administered medications for this visit.    No Known Allergies  Review of Systems negative except from HPI and PMH  Physical Exam BP 126/74 mmHg  Pulse 58  Ht 5\' 9"  (1.753 m)  Wt 217 lb 12.8 oz (98.793 kg)  BMI 32.15 kg/m2 Well developed and well nourished in no acute distress HENT normal E scleral and icterus clear Neck Supple JVP 7-8 cm carotids brisk and full Clear to ausculation  Regular rate and rhythm, no murmurs gallops or rub Soft with active bowel sounds No clubbing cyanosis Trace Edema Alert and oriented, grossly normal motor and sensory function Skin Warm and Dry  ECG demonstrates sinus rhythm at 58 Intervals 15/10/41  Assessment and  Plan  Atrial fibrillation/flutter-paroxysmal  Cardiomyopathy-resolved  Hypertension  Bradycardia  HFpEF  His bradycardia is stable and not excessive based on Holter monitor review he's had no sustained atrial fibrillation. I discussed with him the fact that the  frequency of its recurrences which we will have to wait and see about we'll inform our decision as to alternative therapy, specifically antiarrhythmic therapy. In the interim he is able to take an extra diltiazem if he has a sustained episode of atrial fibrillation.  His blood pressure is well-controlled. We will leave him on his current medications.  He has some evidence of volume overload. We'll increase his diuretic by changing him from 20 daily--40 every other day. We'll plan to see him in 3-4 months.

## 2014-04-23 ENCOUNTER — Telehealth: Payer: Self-pay | Admitting: Pulmonary Disease

## 2014-04-23 DIAGNOSIS — G4733 Obstructive sleep apnea (adult) (pediatric): Secondary | ICD-10-CM

## 2014-04-23 DIAGNOSIS — G4730 Sleep apnea, unspecified: Secondary | ICD-10-CM

## 2014-04-23 NOTE — Telephone Encounter (Signed)
Moderate OSA-stopped breathing 24 times an hour. This was corrected by C Pap 13 cm, large full facemask Prescription sent to DME Please arranged office visit in 6 weeks

## 2014-04-23 NOTE — Sleep Study (Signed)
[place]  NAME: Donald [name]  DATE OF BIRTH: Nov 21, 1932  MEDICAL RECORD NUMBER [id number]  LOCATION: [facility]  PHYSICIAN: Loris Winrow V.  DATE OF STUDY:04/20/14   SLEEP STUDY TYPE: Split Polysomnogram               REFERRING PHYSICIAN: Rigoberto Noel, MD  INDICATION FOR STUDY: 80 year old ex-smoker with loud snoring and non-refreshing sleep  At the time of this study ,they weighed 206 pounds with a height of  5 ft 9 inches and the BMI of 30, neck size of 17 inches. Epworth sleepiness score was 11   This intervention polysomnogram was performed with a sleep technologist in attendance. EEG, EOG,EMG and respiratory parameters recorded. Sleep stages, arousals, limb movements and respiratory data was scored according to criteria laid out by the American Academy of sleep medicine.  SLEEP ARCHITECTURE: Lights out was at 20-22 PM and lights on was at 513 AM. During the baseline portion ,total sleep time was 131 minutes with sleep latency of 4 minutes with latency to REM sleep of 49 minutes and wake after sleep onset of 21 minutes. Sleep efficiency was 84%. Sleep stages as a percentage of total sleep time was N1 12 N2- 761% and REM sleep 12 % ( 16 minutes) . During titration REM sleep accounted for 66 minutes and supine sleep was noted .The longest period of REM sleep was around 3 AM.   AROUSAL DATA : At baseline ,there were 8 arousals with an arousal index of 4 events per hour. Of these 5 were spontaneous, and 3 were associated with respiratory events and 0 were associated periodic limb movements. During titration, the arousal index was 3  events per hour  RESPIRATORY DATA: During the baseline portion,there were 15 obstructive apneas, 2 central apneas, 5 mixed apneas and 31 hypopneas with apnea -hypopnea index of 24 events per hour.  There was no relation to sleep stage or body position. Due to this degree of respiratory disturbance CPAP was initiated  at 4 cm and titrated to find a level of 13 cm due to persistent events during supine sleep. At the level of 13 cm for 13 minutes of sleep, 0 apneas and 0 hypopneas is were noted. Titration was optimal  .  MOVEMENT/PARASOMNIA: There were 38 PLMS with a PLM index of 17 events per hour. The PLM arousal index was 17 events per hour. PLMS seemed to persist during titration.  OXYGEN DATA: The lowest desaturation was 81 % during non-REM sleep and the desaturation index was 18 per hour. This was corrected by titration and the saturations stayed below 88% for 0 minutes during titration.  CARDIAC DATA: The low heart rate was 35 beats per minute. The high heart rate recorded was an artifact. No arrhythmias were noted   IMPRESSION :  1. Moderate obstructive sleep apnea with hypopneas causing sleep fragmentation and mild oxygen desaturation. 2. This was corrected by CPAP of 13 centimeters with large full face mask. Titration was optimal 3. No evidence of cardiac arrhythmias or behavioral disturbance during sleep. Periodic limb movements were noted but not associated with arousals, significance is unclear  RECOMMENDATION:    1. The treatment options for this degree of sleep disordered breathing includes weight loss and CPAP therapy. 2. CPAP can be initiated at 13 centimeters with a large full face mask and compliance monitored at this level. 3. Patient should be cautioned against driving when sleepy.They should be asked to avoid medications with  sedative side effects  Rigoberto Noel MDDiplomate, American Board of Sleep Medicine  ELECTRONICALLY SIGNED ON:  04/23/2014  [facility] PH: [phone]   FX: [phone] [place]

## 2014-04-24 ENCOUNTER — Encounter (HOSPITAL_COMMUNITY): Payer: Self-pay | Admitting: Internal Medicine

## 2014-04-24 NOTE — Telephone Encounter (Signed)
Called home # and was advised to call cell. Called cell-spoke with pt. He reports he has already spoken with someone regarding results and appt scheduled.

## 2014-05-02 ENCOUNTER — Other Ambulatory Visit: Payer: Self-pay | Admitting: *Deleted

## 2014-05-02 MED ORDER — LOSARTAN POTASSIUM 100 MG PO TABS: 100.0000 mg | ORAL_TABLET | Freq: Every day | ORAL | Status: DC

## 2014-05-05 ENCOUNTER — Other Ambulatory Visit: Payer: Self-pay | Admitting: Dermatology

## 2014-05-16 ENCOUNTER — Encounter: Payer: Self-pay | Admitting: Internal Medicine

## 2014-05-23 ENCOUNTER — Encounter: Payer: Self-pay | Admitting: *Deleted

## 2014-05-23 ENCOUNTER — Other Ambulatory Visit: Payer: Self-pay | Admitting: Internal Medicine

## 2014-06-02 ENCOUNTER — Encounter: Payer: Self-pay | Admitting: Internal Medicine

## 2014-06-02 ENCOUNTER — Ambulatory Visit (INDEPENDENT_AMBULATORY_CARE_PROVIDER_SITE_OTHER): Payer: PPO | Admitting: Internal Medicine

## 2014-06-02 ENCOUNTER — Other Ambulatory Visit (INDEPENDENT_AMBULATORY_CARE_PROVIDER_SITE_OTHER): Payer: PPO

## 2014-06-02 VITALS — BP 126/64 | HR 56 | Ht 66.75 in | Wt 219.5 lb

## 2014-06-02 DIAGNOSIS — R635 Abnormal weight gain: Secondary | ICD-10-CM

## 2014-06-02 DIAGNOSIS — K529 Noninfective gastroenteritis and colitis, unspecified: Secondary | ICD-10-CM

## 2014-06-02 DIAGNOSIS — K2970 Gastritis, unspecified, without bleeding: Secondary | ICD-10-CM

## 2014-06-02 DIAGNOSIS — R933 Abnormal findings on diagnostic imaging of other parts of digestive tract: Secondary | ICD-10-CM

## 2014-06-02 DIAGNOSIS — M7989 Other specified soft tissue disorders: Secondary | ICD-10-CM

## 2014-06-02 DIAGNOSIS — K2990 Gastroduodenitis, unspecified, without bleeding: Secondary | ICD-10-CM

## 2014-06-02 LAB — CBC WITH DIFFERENTIAL/PLATELET
BASOS ABS: 0 10*3/uL (ref 0.0–0.1)
BASOS PCT: 0.3 % (ref 0.0–3.0)
EOS ABS: 0.4 10*3/uL (ref 0.0–0.7)
EOS PCT: 3.7 % (ref 0.0–5.0)
HEMATOCRIT: 39.9 % (ref 39.0–52.0)
Hemoglobin: 13.1 g/dL (ref 13.0–17.0)
Lymphocytes Relative: 21.1 % (ref 12.0–46.0)
Lymphs Abs: 2 10*3/uL (ref 0.7–4.0)
MCHC: 32.9 g/dL (ref 30.0–36.0)
MCV: 97.2 fl (ref 78.0–100.0)
MONO ABS: 0.8 10*3/uL (ref 0.1–1.0)
MONOS PCT: 8.2 % (ref 3.0–12.0)
NEUTROS PCT: 66.7 % (ref 43.0–77.0)
Neutro Abs: 6.4 10*3/uL (ref 1.4–7.7)
PLATELETS: 209 10*3/uL (ref 150.0–400.0)
RBC: 4.1 Mil/uL — ABNORMAL LOW (ref 4.22–5.81)
RDW: 14.1 % (ref 11.5–15.5)
WBC: 9.6 10*3/uL (ref 4.0–10.5)

## 2014-06-02 LAB — COMPREHENSIVE METABOLIC PANEL
ALBUMIN: 4 g/dL (ref 3.5–5.2)
ALT: 19 U/L (ref 0–53)
AST: 18 U/L (ref 0–37)
Alkaline Phosphatase: 76 U/L (ref 39–117)
BILIRUBIN TOTAL: 0.6 mg/dL (ref 0.2–1.2)
BUN: 24 mg/dL — AB (ref 6–23)
CHLORIDE: 106 meq/L (ref 96–112)
CO2: 27 mEq/L (ref 19–32)
Calcium: 9.6 mg/dL (ref 8.4–10.5)
Creatinine, Ser: 1.01 mg/dL (ref 0.40–1.50)
GFR: 75.66 mL/min (ref >60.00)
GLUCOSE: 97 mg/dL (ref 70–99)
POTASSIUM: 4.8 meq/L (ref 3.5–5.1)
Sodium: 139 mEq/L (ref 135–145)
Total Protein: 6.9 g/dL (ref 6.0–8.3)

## 2014-06-02 MED ORDER — FUROSEMIDE 40 MG PO TABS: 40.0000 mg | ORAL_TABLET | Freq: Every day | ORAL | Status: DC

## 2014-06-02 NOTE — Progress Notes (Signed)
Subjective:    Patient ID: Donald [name], male    DOB: [birth year], 81 y.o.   MRN: [id number]  HPI Donald [name] is a 81 year old male previously seen for abnormal CT scan of the proximal stomach showing thickening concerning for possible malignancy, history of chronic loose stools, atrial fibrillation status post ablation, hypertension, OSA, who is seen for follow-up. He had a normal-appearing upper endoscopy performed by Dr. Deatra Ina on 04/09/2014. This showed benign appearing GE junction stricture which was traversable with some mild resistance. A small sliding hiatal hernia. Stomach with chronic gastritis in the cardia with very subtle edema just beyond the GE junction in the gastric cardia. Biopsies were taken. No mass lesion was seen. Biopsies revealed chronic active gastritis without H. pylori, dysplasia or malignancy.  He reports no abdominal pain. Appetite has been good, perhaps "too good". No nausea or vomiting. No dysphagia or odynophagia. No heartburn. He has taken pantoprazole 40 mg daily started by Dr. Deatra Ina after upper endoscopy. He does continue to have issues with loose stools and he feels like something is wrong with his bowel movements. He is having 4-6 loose stools a day which can be mostly liquid and at times urgent. He's having some mild fecal seepage. He does note increased lower GI gas. He also complains of an approximate 10 pound weight gain with mild increased swelling in his lower extremities. He has some mild dyspnea with exertion but no dyspnea at rest or chest pain. He is taking furosemide 40 mg every other day after previously taking 20 mg daily. He sees Dr. Caryl Comes with cardiology and Dr. Ronnald Ramp is his primary physician. Taking Eliquis daily.  Review of Systems As per history of present illness, otherwise negative  Current Medications, Allergies, Past Medical History, Past Surgical History, Family History and Social History were reviewed in [employer]  record.     Objective:   Physical Exam BP 126/64 mmHg  Pulse 56  Ht 5' 6.75" (1.695 m)  Wt 219 lb 8 oz (99.565 kg)  BMI 34.66 kg/m2 Constitutional: Well-developed and well-nourished. No distress. HEENT: Normocephalic and atraumatic. Oropharynx is clear and moist. No oropharyngeal exudate. Conjunctivae are normal.  No scleral icterus. Neck: Neck supple. Trachea midline. Cardiovascular: Normal rate, regular rhythm and intact distal pulses. No M/R/G Pulmonary/chest: Effort normal and breath sounds normal. No wheezing, rales or rhonchi. Abdominal: Soft, nontender, nondistended. Bowel sounds active throughout. There are no masses palpable. No hepatosplenomegaly. Extremities: no clubbing, cyanosis, trace to 1+ LE edema to mid shin Lymphadenopathy: No cervical adenopathy noted. Neurological: Alert and oriented to person place and time. Skin: Skin is warm and dry. No rashes noted. Psychiatric: Normal mood and affect. Behavior is normal.  CMP     Component Value Date/Time   NA 141 04/08/2014 1657   K 4.1 04/08/2014 1657   CL 104 04/08/2014 1657   CO2 22 04/08/2014 1657   GLUCOSE 86 04/08/2014 1657   BUN 16 04/08/2014 1657   CREATININE 0.87 04/08/2014 1657   CALCIUM 9.5 04/08/2014 1657   PROT 7.0 04/08/2014 1657   ALBUMIN 3.9 04/08/2014 1657   AST 22 04/08/2014 1657   ALT 20 04/08/2014 1657   ALKPHOS 75 04/08/2014 1657   BILITOT 0.9 04/08/2014 1657   GFRNONAA 80* 04/08/2014 1657   GFRAA >90 04/08/2014 1657    CBC    Component Value Date/Time   WBC 7.1 04/10/2014 0025   RBC 4.34 04/10/2014 0025   HGB 13.9 04/10/2014 0025   HCT  40.7 04/10/2014 0025   PLT 176 04/10/2014 0025   MCV 93.8 04/10/2014 0025   MCH 32.0 04/10/2014 0025   MCHC 34.2 04/10/2014 0025   RDW 12.8 04/10/2014 0025   LYMPHSABS 1.9 03/10/2014 0729   MONOABS 0.7 03/10/2014 0729   EOSABS 0.3 03/10/2014 0729   BASOSABS 0.0 03/10/2014 0729    --EGD reviewed --Last lower endoscopy performed 05/25/2012 by  Dr. Verl Blalock which was a flexible sigmoidoscopy -- moderate diverticulosis in the left colon. Normal mucosa. Random biopsies done to rule out microscopic colitis. 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 Note microscopic colitis, active inflammation, granulomas or adenomatous change. --Last full colonoscopy 05/20/2009 -- diverticulosis with tortuosity. Exam to the cecum. No polyps or cancers      Assessment & Plan:  81 year old male previously seen for abnormal CT scan of the proximal stomach showing thickening concerning for possible malignancy, history of chronic loose stools, atrial fibrillation status post ablation, hypertension, OSA, who is seen for follow-up.  1. Abnormal GI imaging/gastric thickening -- no evidence of malignancy on upper endoscopy performed recently. Mild gastritis and he is now on PPI. Biopsies negative for dysplasia. No upper GI symptoms.  2. Chronic loose stools/diarrhea -- this bothers him particularly in the form of urgency and occasional fecal seepage. Check CBC, CMP, stool cultures, C. difficile PCR, ova and parasite, fecal leukocytes and fecal elastase.  If negative will likely recommend trial of rifaximin 550 mg 3 times daily for 14 days. If no benefit from this therapy then would consider cholestyramine. Biopsies negative in the past for microscopic colitis  3. Increased lower extremity edema -- we will increase Lasix to 40 mg daily assuming normal BMP today. Repeat BMP in 1 week. I will alert Dr. Caryl Comes to this change in therapy and he will follow-up with cardiology.

## 2014-06-02 NOTE — Patient Instructions (Signed)
Your physician has requested that you go to the basement for the following lab work before leaving today: CBC, CMP, Culture, O&P, Leukocytes, Elastace, C Diff  We have sent the following medications to your pharmacy for you to pick up at your convenience: Lasix 40 mg daily (we have increased your dosage from 20 mg to 40 mg)  Your physician has requested that you go to the basement for the following lab work on Monday 06/09/14: BMET  Please follow up with Dr Hilarie Fredrickson on Wednesday, 07/30/14 @ 9:30 am.  CC:Dr T.Jones, Dr Caryl Comes

## 2014-06-03 LAB — CLOSTRIDIUM DIFFICILE BY PCR: CDIFFPCR: NOT DETECTED

## 2014-06-03 LAB — OVA AND PARASITE EXAMINATION: OP: NONE SEEN

## 2014-06-03 NOTE — Addendum Note (Signed)
Addended by: Larina Bras on: 06/03/2014 09:33 AM   Modules accepted: Orders

## 2014-06-05 ENCOUNTER — Encounter: Payer: Self-pay | Admitting: Internal Medicine

## 2014-06-06 ENCOUNTER — Encounter: Payer: Self-pay | Admitting: Internal Medicine

## 2014-06-06 LAB — STOOL CULTURE

## 2014-06-09 ENCOUNTER — Encounter: Payer: Self-pay | Admitting: Internal Medicine

## 2014-06-09 ENCOUNTER — Other Ambulatory Visit (INDEPENDENT_AMBULATORY_CARE_PROVIDER_SITE_OTHER): Payer: PPO

## 2014-06-09 ENCOUNTER — Encounter: Payer: Self-pay | Admitting: Pulmonary Disease

## 2014-06-09 ENCOUNTER — Ambulatory Visit (INDEPENDENT_AMBULATORY_CARE_PROVIDER_SITE_OTHER): Payer: PPO | Admitting: Internal Medicine

## 2014-06-09 ENCOUNTER — Ambulatory Visit (INDEPENDENT_AMBULATORY_CARE_PROVIDER_SITE_OTHER): Payer: PPO | Admitting: Pulmonary Disease

## 2014-06-09 VITALS — BP 132/62 | HR 50 | Temp 97.5°F | Resp 16 | Ht 69.0 in | Wt 218.5 lb

## 2014-06-09 VITALS — BP 118/82 | HR 57 | Ht 69.0 in | Wt 219.4 lb

## 2014-06-09 DIAGNOSIS — G4733 Obstructive sleep apnea (adult) (pediatric): Secondary | ICD-10-CM

## 2014-06-09 DIAGNOSIS — T798XXA Other early complications of trauma, initial encounter: Secondary | ICD-10-CM

## 2014-06-09 DIAGNOSIS — K2990 Gastroduodenitis, unspecified, without bleeding: Secondary | ICD-10-CM

## 2014-06-09 DIAGNOSIS — L089 Local infection of the skin and subcutaneous tissue, unspecified: Secondary | ICD-10-CM | POA: Insufficient documentation

## 2014-06-09 DIAGNOSIS — K2970 Gastritis, unspecified, without bleeding: Secondary | ICD-10-CM

## 2014-06-09 DIAGNOSIS — I4892 Unspecified atrial flutter: Secondary | ICD-10-CM

## 2014-06-09 DIAGNOSIS — T148XXA Other injury of unspecified body region, initial encounter: Secondary | ICD-10-CM

## 2014-06-09 DIAGNOSIS — R933 Abnormal findings on diagnostic imaging of other parts of digestive tract: Secondary | ICD-10-CM

## 2014-06-09 DIAGNOSIS — R635 Abnormal weight gain: Secondary | ICD-10-CM

## 2014-06-09 DIAGNOSIS — K529 Noninfective gastroenteritis and colitis, unspecified: Secondary | ICD-10-CM

## 2014-06-09 DIAGNOSIS — M7989 Other specified soft tissue disorders: Secondary | ICD-10-CM

## 2014-06-09 LAB — BASIC METABOLIC PANEL
BUN: 27 mg/dL — AB (ref 6–23)
CALCIUM: 9.8 mg/dL (ref 8.4–10.5)
CHLORIDE: 103 meq/L (ref 96–112)
CO2: 28 mEq/L (ref 19–32)
Creatinine, Ser: 1.02 mg/dL (ref 0.40–1.50)
GFR: 74.8 mL/min (ref >60.00)
Glucose, Bld: 97 mg/dL (ref 70–99)
POTASSIUM: 4.8 meq/L (ref 3.5–5.1)
Sodium: 138 mEq/L (ref 135–145)

## 2014-06-09 MED ORDER — CEFUROXIME AXETIL 500 MG PO TABS: 500.0000 mg | ORAL_TABLET | Freq: Two times a day (BID) | ORAL | Status: DC

## 2014-06-09 NOTE — Progress Notes (Signed)
   Subjective:    Patient ID: Donald [name], male    DOB: 1932-10-13, 81 y.o.   MRN: [id number]  HPI  81 year old ex-smoker presents for FU of OSA. His snoring is so loud that he has had to move into the guest room. He has used dental appliances without any benefit, his son uses CPAP so he is aware of this modality of treatment.   Smoked a pack per day until he quit in 2000, about 40 pack years. He drinks a cocktail before dinner, and 1-2 glasses of wine after.  Spirometry -  FEV1 ratio of 75, with FEV1 of 77% and FVC of 77% suggesting mild restriction CT chest in 04/2013 showed mild emphysema PSG 04/2014 - 206 lbs-Moderate OSA-stopped breathing 24 times an hour. This was corrected by C Pap 13 cm, large full facemask Prescription sent to DME  06/09/2014  Chief Complaint  Patient presents with  . Follow-up    no more snoring, wears CPAP every night; no complaints   Post CPAP visit Sleeping well, able to use >8h, FF mask ok, pr ok, no nasal stuffiness or dry mouth Wakes up rested, occ naps Able to work 2 ds/week Dyspnea stable 71m download 05/2014 >> excellent usage, AHI 7/h on 13 cm  Review of Systems neg for any significant sore throat, dysphagia, itching, sneezing, nasal congestion or excess/ purulent secretions, fever, chills, sweats, unintended wt loss, pleuritic or exertional cp, hempoptysis, orthopnea pnd or change in chronic leg swelling. Also denies presyncope, palpitations, heartburn, abdominal pain, nausea, vomiting, diarrhea or change in bowel or urinary habits, dysuria,hematuria, rash, arthralgias, visual complaints, headache, numbness weakness or ataxia.      Objective:   Physical Exam  Gen. Pleasant, well-nourished, in no distress ENT - no lesions, no post nasal drip Neck: No JVD, no thyromegaly, no carotid bruits Lungs: no use of accessory muscles, no dullness to percussion, clear without rales or rhonchi  Cardiovascular: Rhythm regular, heart sounds  normal, no  murmurs or gallops, no peripheral edema Musculoskeletal: No deformities, no cyanosis or clubbing         Assessment & Plan:

## 2014-06-09 NOTE — Progress Notes (Signed)
   Subjective:    Patient ID: Donald [name], male    DOB: [birth year], 81 y.o.   MRN: [id number]  Wound Check He was originally treated 10 to 14 days ago. Prior ED Treatment: topical antibiotic ointment. There has been colored discharge from the wound. The redness has not changed. The swelling has not changed. The pain has no pain. He has no difficulty moving the affected extremity or digit.      Review of Systems  Constitutional: Negative.  Negative for fever, chills, diaphoresis, appetite change and fatigue.  HENT: Negative.   Eyes: Negative.   Respiratory: Negative.  Negative for cough, choking, chest tightness, shortness of breath and stridor.   Cardiovascular: Negative.  Negative for chest pain, palpitations and leg swelling.  Gastrointestinal: Negative.  Negative for abdominal pain.  Endocrine: Negative.   Genitourinary: Negative.   Musculoskeletal: Negative.   Skin: Positive for wound.  Allergic/Immunologic: Negative.   Neurological: Negative.   Hematological: Negative.   Psychiatric/Behavioral: Negative.        Objective:   Physical Exam  Constitutional: He is oriented to person, place, and time. He appears well-developed and well-nourished. No distress.  HENT:  Head: Normocephalic and atraumatic.  Mouth/Throat: Oropharynx is clear and moist. No oropharyngeal exudate.  Eyes: Conjunctivae are normal. Right eye exhibits no discharge. Left eye exhibits no discharge. No scleral icterus.  Neck: Normal range of motion. Neck supple. No JVD present. No tracheal deviation present. No thyromegaly present.  Cardiovascular: Normal rate, regular rhythm, normal heart sounds and intact distal pulses.  Exam reveals no gallop and no friction rub.   No murmur heard. Pulmonary/Chest: Effort normal and breath sounds normal. No stridor. No respiratory distress. He has no wheezes. He has no rales. He exhibits no tenderness.  Abdominal: Soft. Bowel sounds are normal. He exhibits no distension  and no mass. There is no tenderness. There is no rebound and no guarding.  Musculoskeletal: Normal range of motion. He exhibits no edema or tenderness.  Lymphadenopathy:    He has no cervical adenopathy.  Neurological: He is oriented to person, place, and time.  Skin: Skin is warm and dry. No rash noted. He is not diaphoretic. No erythema. No pallor.     Vitals reviewed.    Lab Results  Component Value Date   WBC 9.6 06/02/2014   HGB 13.1 06/02/2014   HCT 39.9 06/02/2014   PLT 209.0 06/02/2014   GLUCOSE 97 06/09/2014   CHOL 190 11/20/2013   TRIG 96.0 11/20/2013   HDL 48.90 11/20/2013   LDLDIRECT 138.8 04/18/2011   LDLCALC 122* 11/20/2013   ALT 19 06/02/2014   AST 18 06/02/2014   NA 138 06/09/2014   K 4.8 06/09/2014   CL 103 06/09/2014   CREATININE 1.02 06/09/2014   BUN 27* 06/09/2014   CO2 28 06/09/2014   TSH 0.743 04/08/2014   PSA 0.83 11/20/2013   INR 1.19 04/08/2014       Assessment & Plan:

## 2014-06-09 NOTE — Progress Notes (Signed)
Pre visit review using our clinic review tool, if applicable. No additional management support is needed unless otherwise documented below in the visit note. 

## 2014-06-09 NOTE — Patient Instructions (Signed)
You are adjusting well to CPAP SUpplies will be renewed  We discussed care of machine - filters, mask

## 2014-06-09 NOTE — Assessment & Plan Note (Signed)
You are adjusting well to CPAP SUpplies will be renewed  We discussed care of machine - filters, mask  Weight loss encouraged, compliance with goal of at least 4-6 hrs every night is the expectation. Advised against medications with sedative side effects Cautioned against driving when sleepy - understanding that sleepiness will vary on a day to day basis  FU in 3 m , then annually

## 2014-06-09 NOTE — Patient Instructions (Signed)
Wound Infection °A wound infection happens when a type of germ (bacteria) starts growing in the wound. In some cases, this can cause the wound to break open. If cared for properly, the infected wound will heal from the inside to the outside. Wound infections need treatment. °CAUSES °An infection is caused by bacteria growing in the wound.  °SYMPTOMS  °· Increase in redness, swelling, or pain at the wound site. °· Increase in drainage at the wound site. °· Wound or bandage (dressing) starts to smell bad. °· Fever. °· Feeling tired or fatigued. °· Pus draining from the wound. °TREATMENT  °Your health care provider will prescribe antibiotic medicine. The wound infection should improve within 24 to 48 hours. Any redness around the wound should stop spreading and the wound should be less painful.  °HOME CARE INSTRUCTIONS  °· Only take over-the-counter or prescription medicines for pain, discomfort, or fever as directed by your health care provider. °· Take your antibiotics as directed. Finish them even if you start to feel better. °· Gently wash the area with mild soap and water 2 times a day, or as directed. Rinse off the soap. Pat the area dry with a clean towel. Do not rub the wound. This may cause bleeding. °· Follow your health care provider's instructions for how often you need to change the dressing. °· Apply ointment and a dressing to the wound as directed. °· If the dressing sticks, moisten it with soapy water and gently remove it. °· Change the bandage right away if it becomes wet, dirty, or develops a bad smell. °· Take showers. Do not take tub baths, swim, or do anything that may soak the wound until it is healed. °· Avoid exercises that make you sweat heavily. °· Use anti-itch medicine as directed by your health care provider. The wound may itch when it is healing. Do not pick or scratch at the wound. °· Follow up with your health care provider to get your wound rechecked as directed. °SEEK MEDICAL CARE  IF: °· You have an increase in swelling, pain, or redness around the wound. °· You have an increase in the amount of pus coming from the wound. °· There is a bad smell coming from the wound. °· More of the wound breaks open. °· You have a fever. °MAKE SURE YOU:  °· Understand these instructions. °· Will watch your condition. °· Will get help right away if you are not doing well or get worse. °Document Released: 01/29/2003 Document Revised: 05/07/2013 Document Reviewed: 09/05/2010 °Exit[employer]® Patient Information ©2015 [employer]. This information is not intended to replace advice given to you by your health care provider. Make sure you discuss any questions you have with your health care provider. ° °

## 2014-06-10 ENCOUNTER — Encounter: Payer: Self-pay | Admitting: Internal Medicine

## 2014-06-10 NOTE — Assessment & Plan Note (Signed)
He appears to have impetigo with strep or an early cellulitis Will treat with a broad spectrum ceph (ceftin) He will cont the topical bactroban that he has been using

## 2014-06-26 ENCOUNTER — Ambulatory Visit (INDEPENDENT_AMBULATORY_CARE_PROVIDER_SITE_OTHER): Payer: PPO | Admitting: Family

## 2014-06-26 ENCOUNTER — Encounter: Payer: Self-pay | Admitting: Family

## 2014-06-26 ENCOUNTER — Ambulatory Visit (INDEPENDENT_AMBULATORY_CARE_PROVIDER_SITE_OTHER)
Admission: RE | Admit: 2014-06-26 | Discharge: 2014-06-26 | Disposition: A | Discharge status: Home / Self Care | Payer: PPO | Source: Ambulatory Visit | Attending: Family | Admitting: Family

## 2014-06-26 VITALS — BP 140/102 | HR 90 | Temp 97.9°F | Resp 18 | Ht 69.0 in | Wt 220.0 lb

## 2014-06-26 DIAGNOSIS — R0781 Pleurodynia: Secondary | ICD-10-CM

## 2014-06-26 DIAGNOSIS — R0789 Other chest pain: Secondary | ICD-10-CM | POA: Insufficient documentation

## 2014-06-26 NOTE — Progress Notes (Signed)
Pre visit review using our clinic review tool, if applicable. No additional management support is needed unless otherwise documented below in the visit note. 

## 2014-06-26 NOTE — Progress Notes (Signed)
Subjective:    Patient ID: Donald [name], male    DOB: [birth year], 81 y.o.   MRN: [id number]  Chief Complaint  Patient presents with  . Flank Pain    Has pain in his right side around the rib area, x3 days, says he was bent over on a tire doing some work under a tractor/mower and thinks he may have cracked something, hurts to breathe in and out, cough, sneeze, sit or stand, very tender    HPI:  Donald [name] is a 81 y.o. male who presents today for an acute visit.   This is a new problem. Associated symptoms of sharp pain located in right side rib started about 3 days ago when he was working on a tractor/mower and leaned over a piece of equipment and believes he may have fractured a rib. Intensity of pain is rated a 10/10 at its worst but contextually is worsened by coughing, sneezing, and moving from sit to stand. Using his CPAP at night helps to relieve some of the pain.    No Known Allergies   Current Outpatient Prescriptions on File Prior to Visit  Medication Sig Dispense Refill  . apixaban (ELIQUIS) 5 MG TABS tablet Take 1 tablet (5 mg total) by mouth 2 (two) times daily. Start in the morning on 04/11/14 60 tablet 11  . atorvastatin (LIPITOR) 40 MG tablet Take 1 tablet (40 mg total) by mouth daily. 90 tablet 3  . cefUROXime (CEFTIN) 500 MG tablet Take 1 tablet (500 mg total) by mouth 2 (two) times daily. 20 tablet 0  . diltiazem (CARDIZEM CD) 240 MG 24 hr capsule Take 1 capsule (240 mg total) by mouth daily. 30 capsule 6  . finasteride (PROSCAR) 5 MG tablet Take 1 tablet (5 mg total) by mouth daily. 30 tablet 11  . fluticasone (FLONASE) 50 MCG/ACT nasal spray Place 1 spray into both nostrils daily as needed for allergies.    . furosemide (LASIX) 40 MG tablet Take 1 tablet (40 mg total) by mouth daily. 30 tablet 1  . loratadine (CLARITIN) 10 MG tablet Take 10 mg by mouth daily.     Marland Kitchen losartan (COZAAR) 100 MG tablet Take 1 tablet (100 mg total) by mouth daily. 90 tablet 3  .  metoprolol tartrate (LOPRESSOR) 25 MG tablet Take 0.5 tablets (12.5 mg total) by mouth 2 (two) times daily. 30 tablet 11  . Multiple Vitamins-Minerals (EYE VITAMINS PO) Take 1 tablet by mouth 2 (two) times daily. Macular Supplement    . Multiple Vitamins-Minerals (MULTIVITAMIN GUMMIES ADULT PO) Take 1 tablet by mouth 2 (two) times daily.    . pantoprazole (PROTONIX) 40 MG tablet Take 1 tablet (40 mg total) by mouth daily at 6 (six) AM. 30 tablet 3   No current facility-administered medications on file prior to visit.    Past Medical History  Diagnosis Date  . Hypertension   . Sinus bradycardia   . ED (erectile dysfunction)   . Arthropathy, unspecified, site unspecified   . Prostatitis, unspecified   . Personal history of other diseases of digestive system   . Allergic rhinitis due to pollen   . Benign neoplasm of colon   . Diverticulosis   . Skin cancer     basal and squamous cell  . Abnormal CT scan, stomach     a. Profound thickening of the fundus and cardia of the stomach, suspicious for potential neoplasm by CT 02/2014. Referred to GI.  Marland Kitchen Atherosclerosis  a. Noted by abdominal CT 02/2014 (h/o normal nuc 2011).  . Habitual alcohol use   . Snoring     a. Pending OSA eval by pulm.  . Hyperlipidemia   . Obesity   . Shortness of breath 12/2013  . Atrial flutter 02/24/14    s/p ablation 11/15  . GERD (gastroesophageal reflux disease)   . PAF (paroxysmal atrial fibrillation)   . Cardiomyopathy     tachy mediated - a. TEE (10/15):  EF 30%;  b. Echo after NSR restored (10/15):  mild LVH, EF 55-60%, mild AS, mild AI, mild MR, mild to mod LAE, mild RAE  . Esophageal stricture   . Hiatal hernia   . Diverticulosis   . Diverticulosis      Review of Systems  Respiratory: Negative for shortness of breath.   Cardiovascular: Negative for chest pain, palpitations and leg swelling.  Gastrointestinal: Negative for abdominal pain.  Musculoskeletal:       Positive for rib pain.        Objective:    BP 140/102 mmHg  Pulse 90  Temp(Src) 97.9 F (36.6 C) (Oral)  Resp 18  Ht 5\' 9"  (1.753 m)  Wt 220 lb (99.791 kg)  BMI 32.47 kg/m2  SpO2 97% Nursing note and vital signs reviewed.  Physical Exam  Constitutional: He is oriented to person, place, and time. He appears well-developed and well-nourished. No distress.  Cardiovascular: Normal rate, regular rhythm, normal heart sounds and intact distal pulses.   Pulmonary/Chest: Effort normal and breath sounds normal.  Abdominal: Normal appearance and bowel sounds are normal. There is no tenderness. There is no rigidity, no rebound, no guarding, no CVA tenderness, no tenderness at McBurney's point and negative Murphy's sign.  Musculoskeletal:  No obvious deformity, discoloration, or edema of right ribs noted. Palpable tenderness along 10 through 12th ribs noted.  Neurological: He is alert and oriented to person, place, and time.  Skin: Skin is warm and dry.  Psychiatric: He has a normal mood and affect. His behavior is normal. Judgment and thought content normal.       Assessment & Plan:

## 2014-06-26 NOTE — Patient Instructions (Addendum)
Thank you for choosing [facility].  Summary/Instructions:  Please stop by radiology on the basement level of the building for your x-rays. Your results will be released to [facility] (or called to you) after review, usually within 72 hours after test completion. If any treatments or changes are necessary, you will be notified at that same time.  If your symptoms worsen or fail to improve, please contact our office for further instruction, or in case of emergency go directly to the emergency room at the closest medical facility.    Costochondritis Costochondritis, sometimes called Tietze syndrome, is a swelling and irritation (inflammation) of the tissue (cartilage) that connects your ribs with your breastbone (sternum). It causes pain in the chest and rib area. Costochondritis usually goes away on its own over time. It can take up to 6 weeks or longer to get better, especially if you are unable to limit your activities. CAUSES  Some cases of costochondritis have no known cause. Possible causes include:  Injury (trauma).  Exercise or activity such as lifting.  Severe coughing. SIGNS AND SYMPTOMS  Pain and tenderness in the chest and rib area.  Pain that gets worse when coughing or taking deep breaths.  Pain that gets worse with specific movements. DIAGNOSIS  Your health care provider will do a physical exam and ask about your symptoms. Chest X-rays or other tests may be done to rule out other problems. TREATMENT  Costochondritis usually goes away on its own over time. Your health care provider may prescribe medicine to help relieve pain. HOME CARE INSTRUCTIONS   Avoid exhausting physical activity. Try not to strain your ribs during normal activity. This would include any activities using chest, abdominal, and side muscles, especially if heavy weights are used.  Apply ice to the affected area for the first 2 days after the pain begins.  Put ice in a plastic bag.  Place a  towel between your skin and the bag.    Leave the ice on for 20 minutes, 2-3 times a day.  Only take over-the-counter or prescription medicines as directed by your health care provider. SEEK MEDICAL CARE IF:  You have redness or swelling at the rib joints. These are signs of inf  ection.  Your pain does not go away despite rest or medicine. SEEK IMMEDIATE MEDICAL CARE IF:   Your pain increases or you are very uncomfortable.  You have shortness of breath or difficulty breathing.  You cough up blood.  You have worse chest pains, sweating, or vomiting.  You have a fever or persistent symptoms for more than 2-3 days.  You have a fever and your symptoms suddenly get worse. MAKE SURE YOU:   Understand these instructions.  Will watch your condition.  Will get help right away if you are not doing well or get worse. Document Released: 02/09/2005 Document Revised: 02/20/2013 Document Reviewed: 12/04/2012 [facility] Patient Information 2015 [place], [place]. This information is not intended to replace advice given to you by your health care provider. Make sure you discuss any questions you have with your health care provider.  Rib Fracture A rib fracture is a break or crack in one of the bones of the ribs. The ribs are a group of long, curved bones that wrap around your chest and attach to your spine. They protect your lungs and other organs in the chest cavity. A broken or cracked rib is often painful, but most do not cause other problems. Most rib fractures heal on their own over  time. However, rib fractures can be more serious if multiple ribs are broken or if broken ribs move out of place and push against other structures. CAUSES  A direct blow to the chest. For example, this could happen during contact sports, a car accident, or a fall against a hard object. Repetitive movements with high force, such as pitching a baseball or having severe coughing spells. SYMPTOMS  Pain when you  breathe in or cough. Pain when someone presses on the injured area. DIAGNOSIS  Your caregiver will perform a physical exam. Various imaging tests may be ordered to confirm the diagnosis and to look for related injuries. These tests may include a chest X-ray, computed tomography (CT), magnetic resonance imaging (MRI), or a bone scan. TREATMENT  Rib fractures usually heal on their own in 1-3 months. The longer healing period is often associated with a continued cough or other aggravating activities. During the healing period, pain control is very important. Medication is usually given to control pain. Hospitalization or surgery may be needed for more severe injuries, such as those in which multiple ribs are broken or the ribs have moved out of place.  HOME CARE INSTRUCTIONS  Avoid strenuous activity and any activities or movements that cause pain. Be careful during activities and avoid bumping the injured rib. Gradually increase activity as directed by your caregiver. Only take over-the-counter or prescription medications as directed by your caregiver. Do not take other medications without asking your caregiver first. Apply ice to the injured area for the first 1-2 days after you have been treated or as directed by your caregiver. Applying ice helps to reduce inflammation and pain. Put ice in a plastic bag. Place a towel between your skin and the bag.  Leave the ice on for 15-20 minutes at a time, every 2 hours while you are awake. Perform deep breathing as directed by your caregiver. This will help prevent pneumonia, which is a common complication of a broken rib. Your caregiver may instruct you to: Take deep breaths several times a day. Try to cough several times a day, holding a pillow against the injured area. Use a device called an incentive spirometer to practice deep breathing several times a day. Drink enough fluids to keep your urine clear or pale yellow. This will help you avoid  constipation.  Do not wear a rib belt or binder. These restrict breathing, which can lead to pneumonia.  SEEK IMMEDIATE MEDICAL CARE IF:  You have a fever.  You have difficulty breathing or shortness of breath.  You develop a continual cough, or you cough up thick or bloody sputum. You feel sick to your stomach (nausea), throw up (vomit), or have abdominal pain.  You have worsening pain not controlled with medications.  MAKE SURE YOU: Understand these instructions. Will watch your condition. Will get help right away if you are not doing well or get worse. Document Released: 05/02/2005 Document Revised: 01/02/2013 Document Reviewed: 07/04/2012 [facility] Patient Information 2015 [place], [place]. This information is not intended to replace advice given to you by your health care provider. Make sure you discuss any questions you have with your health care provider.

## 2014-06-26 NOTE — Assessment & Plan Note (Signed)
Symptoms and exam consistent with potential rib contusion however cannot rule out fracture or costochondritis. Obtain unilateral right rib x-ray. Continue over-the-counter Tylenol as needed for symptom relief. Discussed splinting site with abdominal binder or Ace wrap as needed. Patient instructed to follow up if symptoms worsen or fail to improve, or difficulty returning develops.

## 2014-07-16 ENCOUNTER — Encounter: Payer: Self-pay | Admitting: *Deleted

## 2014-07-30 ENCOUNTER — Encounter: Payer: Self-pay | Admitting: Internal Medicine

## 2014-07-30 ENCOUNTER — Ambulatory Visit (INDEPENDENT_AMBULATORY_CARE_PROVIDER_SITE_OTHER): Payer: PPO | Admitting: Internal Medicine

## 2014-07-30 ENCOUNTER — Telehealth: Payer: Self-pay | Admitting: *Deleted

## 2014-07-30 VITALS — BP 90/68 | HR 96 | Ht 69.0 in | Wt 220.0 lb

## 2014-07-30 DIAGNOSIS — K7689 Other specified diseases of liver: Secondary | ICD-10-CM

## 2014-07-30 DIAGNOSIS — Z8719 Personal history of other diseases of the digestive system: Secondary | ICD-10-CM

## 2014-07-30 DIAGNOSIS — R151 Fecal smearing: Secondary | ICD-10-CM

## 2014-07-30 DIAGNOSIS — K5730 Diverticulosis of large intestine without perforation or abscess without bleeding: Secondary | ICD-10-CM

## 2014-07-30 DIAGNOSIS — R140 Abdominal distension (gaseous): Secondary | ICD-10-CM

## 2014-07-30 DIAGNOSIS — Q6100 Congenital renal cyst, unspecified: Secondary | ICD-10-CM

## 2014-07-30 DIAGNOSIS — N281 Cyst of kidney, acquired: Secondary | ICD-10-CM

## 2014-07-30 DIAGNOSIS — K580 Irritable bowel syndrome with diarrhea: Secondary | ICD-10-CM

## 2014-07-30 MED ORDER — PANTOPRAZOLE SODIUM 20 MG PO TBEC: 20.0000 mg | DELAYED_RELEASE_TABLET | Freq: Every day | ORAL | Status: DC

## 2014-07-30 MED ORDER — RIFAXIMIN 550 MG PO TABS: 550.0000 mg | ORAL_TABLET | Freq: Three times a day (TID) | ORAL | Status: DC

## 2014-07-30 NOTE — Telephone Encounter (Signed)
After patient left office, Dr Hilarie Fredrickson asked that an abdominal ultrasound be ordered for follow up of patient's liver and kidney cyst seen on CT 02/2014. Ultrasound has been scheduled for 08/08/14 Friday at 9:00 am with 8:45 am arrival at [facility]. He will need to be NPO 6 hours prior to appointment. I have left a message for patient to call back.

## 2014-07-30 NOTE — Patient Instructions (Signed)
We have sent the following medications to your pharmacy for you to pick up at your convenience: Pantoprazole 20 mg daily (decrease from 40 mg daily)  We will send a xifaxan prescription to [facility] for you.  Please call our office if you continue to have fecal smearing after taking the xifaxan. At that time, we may schedule a hemorrhoidal banding.

## 2014-07-30 NOTE — Telephone Encounter (Signed)
I have spoken to patient to advise him of the information below. He verbalizes understanding of time, date, location and prep for ultrasound.

## 2014-07-30 NOTE — Progress Notes (Signed)
Subjective:    Patient ID: Donald [name], male    DOB: 1932-11-17, 81 y.o.   MRN: [id number]  HPI [name] is a 81 year old male who is seen in follow-up. He has a history of chronic loose stools, left-sided diverticulosis, atrial fibrillation status post ablation, hypertension and sleep apnea. He was last seen in January 2016. There was a scare in late 2015 when a CT scan suggested gastric wall thickening concerning for malignancy. Endoscopy was performed with Dr. Deatra Ina in November 2015 and showed benign-appearing GE junction stricture, small hiatal hernia, chronic gastritis without mass lesion. Biopsy showed chronic active gastritis without H. pylori, dysplasia or malignancy.  Biggest issues of late have been alternating bowel movements. He was having liquid in urgent stools with some fecal seepage. He's also noticed increased bloating and gas. We check stool studies which were unremarkable. Considered rifaximin but he wanted to wait. He returns today and reports bowel movements of action become formed and solid. There still occurring 3-4 times per day and can be quite urgent. He's having some fecal seepage and smearing. Still feels gas and bloating. Has had no change in diet. Denies abdominal pain. Occasionally feels pressure and discomfort in his left inguinal canal when doing yard work or strenuous activity. Overall weight is elevated about 20 pounds since November. Grade appetite. No nausea or vomiting. He reports he's trying to limit his alcohol to 2 drinks in the evening though sometimes drinks more than that. Is taking eliquis daily    Review of Systems As per HPI, otherwise negative  Current Medications, Allergies, Past Medical History, Past Surgical History, Family History and Social History were reviewed in [employer] record.     Objective:   Physical Exam BP 90/68 mmHg  Pulse 96  Ht 5\' 9"  (1.753 m)  Wt 220 lb (99.791 kg)  BMI 32.47 kg/m2 Constitutional:  Well-developed and well-nourished. No distress. HEENT: Normocephalic and atraumatic. Conjunctivae are normal.  No scleral icterus. Neck: Neck supple. Trachea midline. Cardiovascular: Normal rate, regular rhythm and intact distal pulses.  Pulmonary/chest: Effort normal and breath sounds normal. No wheezing, rales or rhonchi. Abdominal: Soft, obese, nontender, nondistended. Bowel sounds active throughout. Extremities: no clubbing, cyanosis, with trace pretib edema Neurological: Alert and oriented to person place and time. Skin: Skin is warm and dry. No rashes noted. Psychiatric: Normal mood and affect. Behavior is normal.  CBC    Component Value Date/Time   WBC 9.6 06/02/2014 1025   RBC 4.10* 06/02/2014 1025   HGB 13.1 06/02/2014 1025   HCT 39.9 06/02/2014 1025   PLT 209.0 06/02/2014 1025   MCV 97.2 06/02/2014 1025   MCH 32.0 04/10/2014 0025   MCHC 32.9 06/02/2014 1025   RDW 14.1 06/02/2014 1025   LYMPHSABS 2.0 06/02/2014 1025   MONOABS 0.8 06/02/2014 1025   EOSABS 0.4 06/02/2014 1025   BASOSABS 0.0 06/02/2014 1025    CMP     Component Value Date/Time   NA 138 06/09/2014 1404   K 4.8 06/09/2014 1404   CL 103 06/09/2014 1404   CO2 28 06/09/2014 1404   GLUCOSE 97 06/09/2014 1404   BUN 27* 06/09/2014 1404   CREATININE 1.02 06/09/2014 1404   CALCIUM 9.8 06/09/2014 1404   PROT 6.9 06/02/2014 1025   ALBUMIN 4.0 06/02/2014 1025   AST 18 06/02/2014 1025   ALT 19 06/02/2014 1025   ALKPHOS 76 06/02/2014 1025   BILITOT 0.6 06/02/2014 1025   GFRNONAA 80* 04/08/2014 1657   GFRAA >  90 04/08/2014 1657    CT abdomen reviewed from October, noncontrast. --He asked about the cystic lesion seen in the liver and kidneys     Assessment & Plan:  81 year old male who is seen in follow-up.  1.  Gas/bloating/fecal smearing -- previous loose stool seem to have improved. I do think his significant left-sided diverticulosis has led to unpredictable bowel habits. He likely has a component of  irritable colon. This is been evaluated in the past by Dr. Sharlett Iles with colonoscopy in 2011 and flexible sigmoidoscopy in 2014. Previous biopsies negative for microscopic colitis. Will give trial of rifaximin 550 mg 3 times a day 10 days for irritable bowel. This would also help with gas and bloating if he has an element of bacterial overgrowth. Regarding fecal smearing, this may be related to internal hemorrhoids and improved with hemorrhoidal banding. I asked that he notify me in about a month after he finishes rifaximin and if the symptoms persist, return for endoscopy and possible banding. He voices understanding  2. History of gastritis -- decrease pantoprazole to 20 mg daily. No upper GI symptoms including note dysphagia, odynophagia, nausea or vomiting.  3. Liver lesion/kidney lesion -- likely benign cysts.Abd [place] to assess for stability

## 2014-08-01 ENCOUNTER — Telehealth: Payer: Self-pay | Admitting: *Deleted

## 2014-08-01 NOTE — Telephone Encounter (Signed)
Dr Hilarie Fredrickson- [facility] states that patient's xifaxan has been approved. However, since he has medicare part D, his copay will still be extremely high. It will cost patient $290.00 for 30 tablets. Please advise.Marland KitchenMarland KitchenMarland Kitchen

## 2014-08-04 NOTE — Telephone Encounter (Signed)
Metronidazole 250 mg TID + bactrim ds BID x 7 days is another option for empiric treatment of SIBO

## 2014-08-05 MED ORDER — SULFAMETHOXAZOLE-TRIMETHOPRIM 800-160 MG PO TABS: 1.0000 | ORAL_TABLET | Freq: Two times a day (BID) | ORAL | Status: DC

## 2014-08-05 MED ORDER — METRONIDAZOLE 250 MG PO TABS: 250.0000 mg | ORAL_TABLET | Freq: Three times a day (TID) | ORAL | Status: DC

## 2014-08-05 NOTE — Telephone Encounter (Signed)
Adivsed patient of Dr Vena Rua recommendations. He verbalizes understanding. Rx's have been sent to [facility].

## 2014-08-05 NOTE — Telephone Encounter (Signed)
Left message for patient to call back  

## 2014-08-08 ENCOUNTER — Ambulatory Visit (HOSPITAL_COMMUNITY)
Admission: RE | Admit: 2014-08-08 | Discharge: 2014-08-08 | Disposition: A | Discharge status: Home / Self Care | Payer: PPO | Source: Ambulatory Visit | Attending: Internal Medicine | Admitting: Internal Medicine

## 2014-08-08 DIAGNOSIS — R160 Hepatomegaly, not elsewhere classified: Secondary | ICD-10-CM | POA: Insufficient documentation

## 2014-08-08 DIAGNOSIS — N281 Cyst of kidney, acquired: Secondary | ICD-10-CM

## 2014-08-08 DIAGNOSIS — K580 Irritable bowel syndrome with diarrhea: Secondary | ICD-10-CM

## 2014-08-08 DIAGNOSIS — N2889 Other specified disorders of kidney and ureter: Secondary | ICD-10-CM | POA: Insufficient documentation

## 2014-08-08 DIAGNOSIS — K7689 Other specified diseases of liver: Secondary | ICD-10-CM

## 2014-08-15 ENCOUNTER — Other Ambulatory Visit: Payer: Self-pay

## 2014-08-15 DIAGNOSIS — N289 Disorder of kidney and ureter, unspecified: Secondary | ICD-10-CM

## 2014-08-15 DIAGNOSIS — K769 Liver disease, unspecified: Secondary | ICD-10-CM

## 2014-08-22 ENCOUNTER — Ambulatory Visit (HOSPITAL_COMMUNITY)
Admission: RE | Admit: 2014-08-22 | Discharge: 2014-08-22 | Disposition: A | Discharge status: Home / Self Care | Payer: PPO | Source: Ambulatory Visit | Attending: Internal Medicine | Admitting: Internal Medicine

## 2014-08-22 DIAGNOSIS — K769 Liver disease, unspecified: Secondary | ICD-10-CM | POA: Diagnosis present

## 2014-08-22 DIAGNOSIS — N289 Disorder of kidney and ureter, unspecified: Secondary | ICD-10-CM | POA: Insufficient documentation

## 2014-08-22 DIAGNOSIS — K7689 Other specified diseases of liver: Secondary | ICD-10-CM | POA: Insufficient documentation

## 2014-08-22 LAB — POCT I-STAT CREATININE: Creatinine, Ser: 1.4 mg/dL — ABNORMAL HIGH (ref 0.50–1.35)

## 2014-08-22 MED ORDER — GADOBENATE DIMEGLUMINE 529 MG/ML IV SOLN
20.0000 mL | Freq: Once | INTRAVENOUS | Status: AC | PRN
  Administered 2014-08-22: 20 mL via INTRAVENOUS

## 2014-09-08 ENCOUNTER — Encounter: Payer: Self-pay | Admitting: *Deleted

## 2014-09-08 ENCOUNTER — Encounter: Payer: Self-pay | Admitting: Internal Medicine

## 2014-09-08 ENCOUNTER — Ambulatory Visit (INDEPENDENT_AMBULATORY_CARE_PROVIDER_SITE_OTHER): Payer: PPO | Admitting: Internal Medicine

## 2014-09-08 ENCOUNTER — Ambulatory Visit: Payer: PPO | Admitting: Adult Health

## 2014-09-08 ENCOUNTER — Ambulatory Visit (INDEPENDENT_AMBULATORY_CARE_PROVIDER_SITE_OTHER): Payer: PPO | Admitting: Adult Health

## 2014-09-08 VITALS — BP 108/74 | HR 150 | Temp 97.7°F | Ht 69.0 in | Wt 220.0 lb

## 2014-09-08 VITALS — BP 110/80 | HR 134 | Ht 67.0 in | Wt 220.6 lb

## 2014-09-08 DIAGNOSIS — I480 Paroxysmal atrial fibrillation: Secondary | ICD-10-CM | POA: Diagnosis not present

## 2014-09-08 DIAGNOSIS — Z01812 Encounter for preprocedural laboratory examination: Secondary | ICD-10-CM

## 2014-09-08 DIAGNOSIS — G4733 Obstructive sleep apnea (adult) (pediatric): Secondary | ICD-10-CM

## 2014-09-08 LAB — CBC WITH DIFFERENTIAL/PLATELET
BASOS PCT: 0.2 % (ref 0.0–3.0)
Basophils Absolute: 0 10*3/uL (ref 0.0–0.1)
EOS PCT: 1.9 % (ref 0.0–5.0)
Eosinophils Absolute: 0.2 10*3/uL (ref 0.0–0.7)
HCT: 40.6 % (ref 39.0–52.0)
Hemoglobin: 14 g/dL (ref 13.0–17.0)
Lymphocytes Relative: 28.2 % (ref 12.0–46.0)
Lymphs Abs: 2.6 10*3/uL (ref 0.7–4.0)
MCHC: 34.6 g/dL (ref 30.0–36.0)
MCV: 94.9 fl (ref 78.0–100.0)
MONOS PCT: 8.3 % (ref 3.0–12.0)
Monocytes Absolute: 0.8 10*3/uL (ref 0.1–1.0)
NEUTROS ABS: 5.6 10*3/uL (ref 1.4–7.7)
NEUTROS PCT: 61.4 % (ref 43.0–77.0)
PLATELETS: 223 10*3/uL (ref 150.0–400.0)
RBC: 4.27 Mil/uL (ref 4.22–5.81)
RDW: 14.1 % (ref 11.5–15.5)
WBC: 9.1 10*3/uL (ref 4.0–10.5)

## 2014-09-08 LAB — BASIC METABOLIC PANEL
BUN: 30 mg/dL — ABNORMAL HIGH (ref 6–23)
CHLORIDE: 104 meq/L (ref 96–112)
CO2: 24 meq/L (ref 19–32)
CREATININE: 1.15 mg/dL (ref 0.40–1.50)
Calcium: 9.6 mg/dL (ref 8.4–10.5)
GFR: 65.09 mL/min (ref >60.00)
Glucose, Bld: 95 mg/dL (ref 70–99)
POTASSIUM: 4.1 meq/L (ref 3.5–5.1)
Sodium: 136 mEq/L (ref 135–145)

## 2014-09-08 NOTE — Progress Notes (Signed)
   Subjective:    Patient ID: Donald [name], male    DOB: 11-Dec-1932, 81 y.o.   MRN: [id number]  HPI  81 year old ex-smoker presents for FU of OSA. His snoring is so loud that he has had to move into the guest room. He has used dental appliances without any benefit, his son uses CPAP so he is aware of this modality of treatment.   Smoked a pack per day until he quit in 2000, about 40 pack years. He drinks a cocktail before dinner, and 1-2 glasses of wine after.  Spirometry -  FEV1 ratio of 75, with FEV1 of 77% and FVC of 77% suggesting mild restriction CT chest in 04/2013 showed mild emphysema PSG 04/2014 - 206 lbs-Moderate OSA-stopped breathing 24 times an hour. This was corrected by C Pap 13 cm, large full facemask Prescription sent to DME  Post CPAP visit Sleeping well, able to use >8h, FF mask ok, pr ok, no nasal stuffiness or dry mouth Wakes up rested, occ naps Able to work 2 ds/week Dyspnea stable 93m download 05/2014 >> excellent usage, AHI 7/h on 13 cm   09/08/2014 Follow up OSA Patient returns for a three-month follow-up for sleep apnea. Says overall he is doing very well with his C Pap machine. Feels more reseted.  Feels he goets , gets good sleep.  3 month Download shows great compliance with 100% usage with set pressure at 13 cmH2O Average usage 8 hours. Minimal leaks, AHI 7.7 (similar to last download)  Patient does have atrial fibrillation. He is followed by cardiology and is on metoprolol, diltiazem, and Eliquis  On exam today. Patient was noted have a heart rate around 150 . He says he does not notice when he is in atrial fib.  He denies any chest pain, palpitations, dyspnea, dizziness, leg swelling, presyncope, headache, syncope episodes, visual/sppech changes. " I feel fine "  EKG today shows tachycardia ~150 bpm Called his cardiology office and they have agreed to see him today.     Review of Systems neg for any significant sore throat, dysphagia,  itching, sneezing, nasal congestion or excess/ purulent secretions, fever, chills, sweats, unintended wt loss, pleuritic or exertional cp, hempoptysis, orthopnea pnd or change in chronic leg swelling. Also denies presyncope, palpitations, heartburn, abdominal pain, nausea, vomiting, diarrhea or change in bowel or urinary habits, dysuria,hematuria, rash, arthralgias, visual complaints, headache, numbness weakness or ataxia.      Objective:   Physical Exam  Gen. Pleasant, well-nourished, in no distress ENT - no lesions, no post nasal drip, class 2 airway  Neck: No JVD, no thyromegaly, no carotid bruits Lungs: no use of accessory muscles, no dullness to percussion, clear without rales or rhonchi  Cardiovascular: Irregular , tachy , no murmurs or gallops, tr peripheral edema Musculoskeletal: No deformities, no cyanosis or clubbing  Neuro  ; intact w/ no focal deficits noted.   EKG : Tachycardia HR 150 bpm       Assessment & Plan:

## 2014-09-08 NOTE — Assessment & Plan Note (Signed)
EKG shows Tachycardia vs Atrial flutter w/ RVR  Discussed results with pt and he says he feels fine with no associated symptoms  B/p is stable .  Cardiology was contacted and they will work pt in today to be seen  Advised if he develops any symptoms such as dizziness, chest pain, dyspnea to call immediately, 911. /ER .  Pt to take extra metoprolol 12.5mg  today , see cardiology as planned

## 2014-09-08 NOTE — Progress Notes (Signed)
Patient Care Team: Janith Lima, MD as PCP - General (Internal Medicine) Franchot Gallo, MD (Urology) [name] [name], MD (Dermatology) Monna Fam, MD as Consulting Physician (Ophthalmology) Deboraha Sprang, MD as Consulting Physician (Cardiology)   HPI  Donald [name] is a 81 y.o. male Seem in followup of ablation of atrial flutter done by me 10/15.  He was seen November 2015 with evidence of rapid atrial fibrillation with a course flutter-like appearance.  He has had problems with sinus bradycardia. He was referred over today from pulmonary because of recurrent tachycardia.    October 2015 Echo - Left ventricle: The cavity size was normal. Wall thickness was increased in a pattern of mild LVH. Systolic function was normal. The estimated ejection fraction was in the range of 55% to 60%.<<30% (10/15)  Aortic valve: Heavily calcirfied right coronary cusp. There was mild stenosis. There was mild regurgitation.BICUSPID - Mitral valve: There was mild regurgitation. - Left atrium: The atrium was mildly to moderately dilated. - Right atrium: The atrium was mildly dilated. - Atrial septum: No defect or patent foramen ovale was identified.  In the interval Holter monitor demonstrated short runs of atrial tachycardia nocturnal bradycardia nadir to 39; daytime bradycardia in the 40s  He was seen in pulmonary today and he noted increased heart rate is referred in here. He is unaware of tachycardia. He has noted no change in his functional status.  He denies edema    Past Medical History  Diagnosis Date  . Hypertension   . Sinus bradycardia   . ED (erectile dysfunction)   . Arthropathy, unspecified, site unspecified   . Prostatitis, unspecified   . Personal history of other diseases of digestive system   . Allergic rhinitis due to pollen   . Benign neoplasm of colon   . Diverticulosis   . Skin cancer     basal and squamous cell  . Abnormal CT scan, stomach    a. Profound thickening of the fundus and cardia of the stomach, suspicious for potential neoplasm by CT 02/2014. Referred to GI.  Marland Kitchen Atherosclerosis     a. Noted by abdominal CT 02/2014 (h/o normal nuc 2011).  . Habitual alcohol use   . Snoring     a. Pending OSA eval by pulm.  . Hyperlipidemia   . Obesity   . Shortness of breath 12/2013  . Atrial flutter 02/24/14    s/p ablation 11/15  . GERD (gastroesophageal reflux disease)   . PAF (paroxysmal atrial fibrillation)   . Cardiomyopathy     tachy mediated - a. TEE (10/15):  EF 30%;  b. Echo after NSR restored (10/15):  mild LVH, EF 55-60%, mild AS, mild AI, mild MR, mild to mod LAE, mild RAE  . Esophageal stricture   . Hiatal hernia   . Diverticulosis     Past Surgical History  Procedure Laterality Date  . Inguinal hernia repair      right  . Inguinal hernia repair      left  . Tonsillectomy    . Orif fracture of the elbow    . Squamous cell carcinoma excision    . Skin cancer excision  05/21/12    Squamous cell ca  . Hernia repair    . Tee without cardioversion N/A 02/24/2014    Procedure: TRANSESOPHAGEAL ECHOCARDIOGRAM (TEE);  Surgeon: Lelon Perla, MD;  Location[facility] ENDOSCOPY;  Service: Cardiovascular;  Laterality: N/A;  . Cardioversion N/A 02/24/2014    Procedure: CARDIOVERSION;  Surgeon: Lelon Perla, MD;  Location: [facility] ENDOSCOPY;  Service: Cardiovascular;  Laterality: N/A;  . Ablation  03/19/2014    RFCA of atrial flutter by Dr Caryl Comes  . Esophagogastroduodenoscopy N/A 04/09/2014    Procedure: ESOPHAGOGASTRODUODENOSCOPY (EGD);  Surgeon: Inda Castle, MD;  Location: [facility];  Service: Endoscopy;  Laterality: N/A;  . Atrial flutter ablation N/A 03/19/2014    Procedure: ATRIAL FLUTTER ABLATION;  Surgeon: Deboraha Sprang, MD;  Location: [facility] CATH LAB;  Service: Cardiovascular;  Laterality: N/A;    Current Outpatient Prescriptions  Medication Sig Dispense Refill  . apixaban (ELIQUIS) 5 MG TABS tablet Take 1  tablet (5 mg total) by mouth 2 (two) times daily. Start in the morning on 04/11/14 60 tablet 11  . atorvastatin (LIPITOR) 40 MG tablet Take 1 tablet (40 mg total) by mouth daily. 90 tablet 3  . diltiazem (CARDIZEM CD) 240 MG 24 hr capsule Take 1 capsule (240 mg total) by mouth daily. 30 capsule 6  . finasteride (PROSCAR) 5 MG tablet Take 1 tablet (5 mg total) by mouth daily. 30 tablet 11  . fluticasone (FLONASE) 50 MCG/ACT nasal spray Place 1 spray into both nostrils daily as needed for allergies.    . furosemide (LASIX) 40 MG tablet Take 1 tablet (40 mg total) by mouth daily. 30 tablet 1  . loratadine (CLARITIN) 10 MG tablet Take 10 mg by mouth daily.     Marland Kitchen losartan (COZAAR) 100 MG tablet Take 1 tablet (100 mg total) by mouth daily. 90 tablet 3  . metoprolol tartrate (LOPRESSOR) 25 MG tablet Take 0.5 tablets (12.5 mg total) by mouth 2 (two) times daily. 30 tablet 11  . Multiple Vitamins-Minerals (EYE VITAMINS PO) Take 1 tablet by mouth 2 (two) times daily. Macular Supplement    . Multiple Vitamins-Minerals (MULTIVITAMIN GUMMIES ADULT PO) Take 1 tablet by mouth 2 (two) times daily.    . pantoprazole (PROTONIX) 20 MG tablet Take 1 tablet (20 mg total) by mouth daily. 30 tablet 3   No current facility-administered medications for this visit.    No Known Allergies  Review of Systems negative except from HPI and PMH  Physical Exam BP 110/80 mmHg  Pulse 134  Ht 5\' 7"  (1.702 m)  Wt 220 lb 9.6 oz (100.064 kg)  BMI 34.54 kg/m2 Well developed and well nourished in no acute distress HENT normal E scleral and icterus clear Neck Supple JVP 9-10 cm carotids brisk and full Clear to ausculation .Irregularly Irregular rate and rhythm with fast ventricular response  no murmurs gallops or rub Soft with active bowel sounds No clubbing cyanosis Trace Edema Alert and oriented, grossly normal motor and sensory function Skin Warm and Dry  ECG demonstrates atrial flutter with some atypical flutter  waves in the inferior leads Intervals -/10/33 (48)  Assessment and  Plan  Atrial fibrillation/flutter-paroxysmal  Cardiomyopathy-resolved  Hypertension  Bradycardia  HFpEF  Blood pressure is well-controlled  There is evidence of volume overloaded manifested by JVD in the little bit of edema. However, this occurs in the consequence of rapid atrial flutter/fibrillation. At this juncture I would like to pursue cardioversion. I have instructed him on how to take his pulse and he will get a pulse oximetry monitor for home  So as to be able to identify the onset of recurrent tachycardia. This is important as he has a history of resolved tachycardia-induced cardiomyopathy and there are good data to suggest that this can recur very rapidly with redevelopment of rapid rates.  We will continue him on his current medications otherwise.

## 2014-09-08 NOTE — Patient Instructions (Addendum)
Continue on C Pap at bedtime. Do not drive if sleepy Follow with Dr. Elsworth Soho in 6 months and as needed Your heart rate is too high today /Atrial Fib , Cardiology will see you today at 2:30 .  If you develop chest pain, dyspnea or palpitations  call immediately or go to ER .   Please contact office for sooner follow up if symptoms do not improve or worsen or seek emergency care

## 2014-09-08 NOTE — Assessment & Plan Note (Signed)
Continue on C Pap at bedtime. Do not drive if sleepy Follow with Dr. Elsworth Soho in 6 months and as needed

## 2014-09-08 NOTE — Patient Instructions (Signed)
Medication Instructions:  Your physician recommends that you continue on your current medications as directed. Please refer to the Current Medication list given to you today.  Labwork: Pre procedure labs: BMET, CBCD  Testing/Procedures: Your physician has recommended that you have a Cardioversion (DCCV). Electrical Cardioversion uses a jolt of electricity to your heart either through paddles or wired patches attached to your chest. This is a controlled, usually prescheduled, procedure. Defibrillation is done under light anesthesia in the hospital, and you usually go home the day of the procedure. This is done to get your heart back into a normal rhythm. You are not awake for the procedure.  Follow-Up: Follow up will be determined at discharge after cardioversion  Any Other Special Instructions Will Be Listed Below (If Applicable). Your provider has recommended a cardioversion.   You are scheduled for a cardioversion on 09/10/14  with Dr. Caryl Comes or associates. Please go to [facility] 2nd Floor at 1:00 pm.  Enter through the [place] not have any food or drink after midnight on 09/09/14.  You may take your medicines with a sip of water on the day of your procedure.  You will need someone to drive you home following your procedure.   Call the [place] office at [phone] if you have any questions, problems or concerns.     Electrical Cardioversion Electrical cardioversion is the delivery of a jolt of electricity to change the rhythm of the heart. Sticky patches or metal paddles are placed on the chest to deliver the electricity from a device. This is done to restore a normal rhythm. A rhythm that is too fast or not regular keeps the heart from pumping well. Electrical cardioversion is done in an emergency if:   There is low or no blood pressure as a result of the heart rhythm.   Normal rhythm must be restored as fast as possible to  protect the brain and heart from further damage.   It may save a life. Cardioversion may be done for heart rhythms that are not immediately life threatening, such as atrial fibrillation or flutter, in which:   The heart is beating too fast or is not regular.   Medicine to change the rhythm has not worked.   It is safe to wait in order to allow time for preparation.  Symptoms of the abnormal rhythm are bothersome.  The risk of stroke and other serious problems can be reduced.  LET [facility] CARE PROVIDER KNOW ABOUT:   Any allergies you have.  All medicines you are taking, including vitamins, herbs, eye drops, creams, and over-the-counter medicines.  Previous problems you or members of your family have had with the use of anesthetics.   Any blood disorders you have.   Previous surgeries you have had.   Medical conditions you have.   RISKS AND COMPLICATIONS  Generally, this is a safe procedure. However, problems can occur and include:   Breathing problems related to the anesthetic used.  A blood clot that breaks free and travels to other parts of your body. This could cause a stroke or other problems. The risk of this is lowered by use of blood-thinning medicine (anticoagulant) prior to the procedure.  Cardiac arrest (rare).   BEFORE THE PROCEDURE   You may have tests to detect blood clots in your heart and to evaluate heart function.  You may start taking anticoagulants so your blood does not clot as easily.   Medicines  may be given to help stabilize your heart rate and rhythm.   PROCEDURE  You will be given medicine through an IV tube to reduce discomfort and make you sleepy (sedative).   An electrical shock will be delivered.   AFTER THE PROCEDURE Your heart rhythm will be watched to make sure it does not change. You will need someone to drive you home.

## 2014-09-09 MED ORDER — SODIUM CHLORIDE 0.9 % IV SOLN
INTRAVENOUS | Status: DC
  Administered 2014-09-10: 13:00:00 via INTRAVENOUS

## 2014-09-09 NOTE — Progress Notes (Signed)
Reviewed & agree with plan  

## 2014-09-10 ENCOUNTER — Other Ambulatory Visit: Payer: Self-pay

## 2014-09-10 ENCOUNTER — Ambulatory Visit (HOSPITAL_COMMUNITY)
Admission: RE | Admit: 2014-09-10 | Discharge: 2014-09-10 | Disposition: A | Discharge status: Home / Self Care | Payer: PPO | Source: Ambulatory Visit | Attending: Internal Medicine | Admitting: Internal Medicine

## 2014-09-10 ENCOUNTER — Other Ambulatory Visit: Payer: Self-pay | Admitting: Internal Medicine

## 2014-09-10 ENCOUNTER — Encounter (HOSPITAL_COMMUNITY)
Admission: RE | Disposition: A | Discharge status: Home / Self Care | Payer: Self-pay | Source: Ambulatory Visit | Attending: Internal Medicine

## 2014-09-10 ENCOUNTER — Encounter (HOSPITAL_COMMUNITY): Payer: Self-pay | Admitting: Internal Medicine

## 2014-09-10 DIAGNOSIS — J301 Allergic rhinitis due to pollen: Secondary | ICD-10-CM | POA: Diagnosis not present

## 2014-09-10 DIAGNOSIS — Z79899 Other long term (current) drug therapy: Secondary | ICD-10-CM | POA: Insufficient documentation

## 2014-09-10 DIAGNOSIS — E785 Hyperlipidemia, unspecified: Secondary | ICD-10-CM | POA: Diagnosis not present

## 2014-09-10 DIAGNOSIS — Z7951 Long term (current) use of inhaled steroids: Secondary | ICD-10-CM | POA: Insufficient documentation

## 2014-09-10 DIAGNOSIS — K219 Gastro-esophageal reflux disease without esophagitis: Secondary | ICD-10-CM | POA: Insufficient documentation

## 2014-09-10 DIAGNOSIS — I10 Essential (primary) hypertension: Secondary | ICD-10-CM | POA: Insufficient documentation

## 2014-09-10 DIAGNOSIS — Z85828 Personal history of other malignant neoplasm of skin: Secondary | ICD-10-CM | POA: Insufficient documentation

## 2014-09-10 DIAGNOSIS — E669 Obesity, unspecified: Secondary | ICD-10-CM | POA: Insufficient documentation

## 2014-09-10 DIAGNOSIS — I480 Paroxysmal atrial fibrillation: Secondary | ICD-10-CM

## 2014-09-10 DIAGNOSIS — Z7901 Long term (current) use of anticoagulants: Secondary | ICD-10-CM | POA: Diagnosis not present

## 2014-09-10 DIAGNOSIS — Z9889 Other specified postprocedural states: Secondary | ICD-10-CM | POA: Insufficient documentation

## 2014-09-10 DIAGNOSIS — Z6832 Body mass index (BMI) 32.0-32.9, adult: Secondary | ICD-10-CM | POA: Insufficient documentation

## 2014-09-10 DIAGNOSIS — I4892 Unspecified atrial flutter: Secondary | ICD-10-CM | POA: Diagnosis not present

## 2014-09-10 DIAGNOSIS — I509 Heart failure, unspecified: Secondary | ICD-10-CM | POA: Diagnosis not present

## 2014-09-10 HISTORY — PX: CARDIOVERSION: SHX1299

## 2014-09-10 SURGERY — CARDIOVERSION
Laterality: Right

## 2014-09-10 MED ORDER — MIDAZOLAM HCL 5 MG/5ML IJ SOLN
INTRAMUSCULAR | Status: AC
  Filled 2014-09-10: qty 5

## 2014-09-10 MED ORDER — FENTANYL CITRATE (PF) 100 MCG/2ML IJ SOLN
INTRAMUSCULAR | Status: AC
  Filled 2014-09-10: qty 2

## 2014-09-10 MED ORDER — AMIODARONE HCL 150 MG/3ML IV SOLN
INTRAVENOUS | Status: AC
  Filled 2014-09-10: qty 3

## 2014-09-10 NOTE — CV Procedure (Signed)
Preop Dx atrial flutter Post op DX  NSR  Procedure  DC Cardioversion  Pt was sedated by anesthesia receiving versed and fentanyl  A synchronized shock 200 joules restored sinus Rhythm with early resumption of atrial flutter.  A repeat shock synchronized at 150 J also restored sinus with early return to atrial flutter  I then fgave him amio 150 IV with repeat cardioversion  150 J was delivered in a synchronized fashion restoring sinus rhythm.  Pt tolerated without difficulty

## 2014-09-10 NOTE — H&P (View-Only) (Signed)
Patient Care Team: Janith Lima, MD as PCP - General (Internal Medicine) Franchot Gallo, MD (Urology) [name] [name], MD (Dermatology) Monna Fam, MD as Consulting Physician (Ophthalmology) Deboraha Sprang, MD as Consulting Physician (Cardiology)   HPI  Donald [name] is a 81 y.o. male Seem in followup of ablation of atrial flutter done by me 10/15.  He was seen November 2015 with evidence of rapid atrial fibrillation with a course flutter-like appearance.  He has had problems with sinus bradycardia. He was referred over today from pulmonary because of recurrent tachycardia.    October 2015 Echo - Left ventricle: The cavity size was normal. Wall thickness was increased in a pattern of mild LVH. Systolic function was normal. The estimated ejection fraction was in the range of 55% to 60%.<<30% (10/15)  Aortic valve: Heavily calcirfied right coronary cusp. There was mild stenosis. There was mild regurgitation.BICUSPID - Mitral valve: There was mild regurgitation. - Left atrium: The atrium was mildly to moderately dilated. - Right atrium: The atrium was mildly dilated. - Atrial septum: No defect or patent foramen ovale was identified.  In the interval Holter monitor demonstrated short runs of atrial tachycardia nocturnal bradycardia nadir to 39; daytime bradycardia in the 40s  He was seen in pulmonary today and he noted increased heart rate is referred in here. He is unaware of tachycardia. He has noted no change in his functional status.  He denies edema    Past Medical History  Diagnosis Date  . Hypertension   . Sinus bradycardia   . ED (erectile dysfunction)   . Arthropathy, unspecified, site unspecified   . Prostatitis, unspecified   . Personal history of other diseases of digestive system   . Allergic rhinitis due to pollen   . Benign neoplasm of colon   . Diverticulosis   . Skin cancer     basal and squamous cell  . Abnormal CT scan, stomach    a. Profound thickening of the fundus and cardia of the stomach, suspicious for potential neoplasm by CT 02/2014. Referred to GI.  Marland Kitchen Atherosclerosis     a. Noted by abdominal CT 02/2014 (h/o normal nuc 2011).  . Habitual alcohol use   . Snoring     a. Pending OSA eval by pulm.  . Hyperlipidemia   . Obesity   . Shortness of breath 12/2013  . Atrial flutter 02/24/14    s/p ablation 11/15  . GERD (gastroesophageal reflux disease)   . PAF (paroxysmal atrial fibrillation)   . Cardiomyopathy     tachy mediated - a. TEE (10/15):  EF 30%;  b. Echo after NSR restored (10/15):  mild LVH, EF 55-60%, mild AS, mild AI, mild MR, mild to mod LAE, mild RAE  . Esophageal stricture   . Hiatal hernia   . Diverticulosis     Past Surgical History  Procedure Laterality Date  . Inguinal hernia repair      right  . Inguinal hernia repair      left  . Tonsillectomy    . Orif fracture of the elbow    . Squamous cell carcinoma excision    . Skin cancer excision  05/21/12    Squamous cell ca  . Hernia repair    . Tee without cardioversion N/A 02/24/2014    Procedure: TRANSESOPHAGEAL ECHOCARDIOGRAM (TEE);  Surgeon: Lelon Perla, MD;  Location: [facility] ENDOSCOPY;  Service: Cardiovascular;  Laterality: N/A;  . Cardioversion N/A 02/24/2014    Procedure: CARDIOVERSION;  Surgeon: Lelon Perla, MD;  Location: [facility] ENDOSCOPY;  Service: Cardiovascular;  Laterality: N/A;  . Ablation  03/19/2014    RFCA of atrial flutter by Dr Caryl Comes  . Esophagogastroduodenoscopy N/A 04/09/2014    Procedure: ESOPHAGOGASTRODUODENOSCOPY (EGD);  Surgeon: Inda Castle, MD;  Location: [place];  Service: Endoscopy;  Laterality: N/A;  . Atrial flutter ablation N/A 03/19/2014    Procedure: ATRIAL FLUTTER ABLATION;  Surgeon: Deboraha Sprang, MD;  Location[facility] CATH LAB;  Service: Cardiovascular;  Laterality: N/A;    Current Outpatient Prescriptions  Medication Sig Dispense Refill  . apixaban (ELIQUIS) 5 MG TABS tablet Take 1  tablet (5 mg total) by mouth 2 (two) times daily. Start in the morning on 04/11/14 60 tablet 11  . atorvastatin (LIPITOR) 40 MG tablet Take 1 tablet (40 mg total) by mouth daily. 90 tablet 3  . diltiazem (CARDIZEM CD) 240 MG 24 hr capsule Take 1 capsule (240 mg total) by mouth daily. 30 capsule 6  . finasteride (PROSCAR) 5 MG tablet Take 1 tablet (5 mg total) by mouth daily. 30 tablet 11  . fluticasone (FLONASE) 50 MCG/ACT nasal spray Place 1 spray into both nostrils daily as needed for allergies.    . furosemide (LASIX) 40 MG tablet Take 1 tablet (40 mg total) by mouth daily. 30 tablet 1  . loratadine (CLARITIN) 10 MG tablet Take 10 mg by mouth daily.     Marland Kitchen losartan (COZAAR) 100 MG tablet Take 1 tablet (100 mg total) by mouth daily. 90 tablet 3  . metoprolol tartrate (LOPRESSOR) 25 MG tablet Take 0.5 tablets (12.5 mg total) by mouth 2 (two) times daily. 30 tablet 11  . Multiple Vitamins-Minerals (EYE VITAMINS PO) Take 1 tablet by mouth 2 (two) times daily. Macular Supplement    . Multiple Vitamins-Minerals (MULTIVITAMIN GUMMIES ADULT PO) Take 1 tablet by mouth 2 (two) times daily.    . pantoprazole (PROTONIX) 20 MG tablet Take 1 tablet (20 mg total) by mouth daily. 30 tablet 3   No current facility-administered medications for this visit.    No Known Allergies  Review of Systems negative except from HPI and PMH  Physical Exam BP 110/80 mmHg  Pulse 134  Ht 5\' 7"  (1.702 m)  Wt 220 lb 9.6 oz (100.064 kg)  BMI 34.54 kg/m2 Well developed and well nourished in no acute distress HENT normal E scleral and icterus clear Neck Supple JVP 9-10 cm carotids brisk and full Clear to ausculation .Irregularly Irregular rate and rhythm with fast ventricular response  no murmurs gallops or rub Soft with active bowel sounds No clubbing cyanosis Trace Edema Alert and oriented, grossly normal motor and sensory function Skin Warm and Dry  ECG demonstrates atrial flutter with some atypical flutter  waves in the inferior leads Intervals -/10/33 (48)  Assessment and  Plan  Atrial fibrillation/flutter-paroxysmal  Cardiomyopathy-resolved  Hypertension  Bradycardia  HFpEF  Blood pressure is well-controlled  There is evidence of volume overloaded manifested by JVD in the little bit of edema. However, this occurs in the consequence of rapid atrial flutter/fibrillation. At this juncture I would like to pursue cardioversion. I have instructed him on how to take his pulse and he will get a pulse oximetry monitor for home  So as to be able to identify the onset of recurrent tachycardia. This is important as he has a history of resolved tachycardia-induced cardiomyopathy and there are good data to suggest that this can recur very rapidly with redevelopment of rapid rates.  We will continue him on his current medications otherwise.

## 2014-09-10 NOTE — Interval H&P Note (Signed)
History and Physical Interval Note:  09/10/2014 2:04 PM  Donald [name]  has presented today for surgery, with the diagnosis of aflutter  The various methods of treatment have been discussed with the patient and family. After consideration of risks, benefits and other options for treatment, the patient has consented to  Procedure(s): CARDIOVERSION (Right) as a surgical intervention .  The patient's history has been reviewed, patient examined, no change in status, stable for surgery.  I have reviewed the patient's chart and labs.  Questions were answered to the patient's satisfaction.     Virl Axe

## 2014-09-10 NOTE — Discharge Instructions (Signed)

## 2014-09-11 ENCOUNTER — Telehealth: Payer: Self-pay

## 2014-09-11 NOTE — Telephone Encounter (Signed)
Patient called re: AWV with Manuela Schwartz. Explained AWV and scheduled patient for 09/15/2014 @ 8;45. Per the schedule, unable to verify if susan or lucas will be seeing patient. Patient requests a mychart message top verify this information or any other info that susan might need.

## 2014-09-11 NOTE — Telephone Encounter (Signed)
LVM for pt to call back as soon as possible.    RE: AWV scheduled for 2016 with Manuela Schwartz.

## 2014-09-12 ENCOUNTER — Telehealth: Payer: Self-pay | Admitting: Internal Medicine

## 2014-09-12 NOTE — Telephone Encounter (Signed)
Spoke with patient who reports feeling blah for the last few days.  He states his ears are throbbing and he is having a discomfort feeling in the back of his throat.  He is sneezing but no more usual for this time of year.  HR and BP are OK per pt.  HR 60 per pt.  He does not feel like his heart is out of rhythm.  Advised pt to continue to monitor BP/HR.  He will take plain Claritin or Zyrtec OTC to see if this helps with ears throbbing and throat discomfort.  He can f/u with PCP is this doesn't improve.  He should call back if he feels as though his heart is out of rhythm.

## 2014-09-12 NOTE — Telephone Encounter (Signed)
New message     Patient calling C/O discomfort in back on throat, ears are popping.  Just not feeling himself. Very little strength.    S/p cardioversion on Wednesday afternoon.    Blood pressure & heart rate is fine.

## 2014-09-15 ENCOUNTER — Ambulatory Visit (INDEPENDENT_AMBULATORY_CARE_PROVIDER_SITE_OTHER): Payer: PPO

## 2014-09-15 ENCOUNTER — Encounter: Payer: Self-pay | Admitting: Internal Medicine

## 2014-09-15 ENCOUNTER — Telehealth: Payer: Self-pay | Admitting: Internal Medicine

## 2014-09-15 VITALS — BP 120/70 | HR 50 | Ht 69.0 in | Wt 222.0 lb

## 2014-09-15 DIAGNOSIS — Z0000 Encounter for general adult medical examination without abnormal findings: Secondary | ICD-10-CM | POA: Diagnosis not present

## 2014-09-15 NOTE — Telephone Encounter (Signed)
New problem    Stating pt's pulse rate is 48. Please call the pt at [phone] per Susan(RN) calling.

## 2014-09-15 NOTE — Telephone Encounter (Signed)
Call Documentation      Donald [name] at 09/15/2014 4:02 PM     Status: Signed       Expand All Collapse All   New Message  PT RETURNING [facility] PHONE CALL. Please call back and discuss.

## 2014-09-15 NOTE — Progress Notes (Signed)
Subjective:   Donald [name] is a 81 y.o. male who presents for Medicare Annual/Subsequent preventive examination.  Review of Systems:   Donald [name] is a 81 y.o. male who presents for Medicare Annual/Subsequent preventive examination.   HRA assessment completed during visit  Health better, the same or worse than last year? "gone down some" heart and weight  Health described as excellent; very good; good; fair or poor? Good health  Current Exercise; goes to fitness center, would like to get back  States he is tired; yawns a lot; sleeps well  Current dietary ; discussed dietary ; portion control  Psychosocial changes in the last year; moves; losses of family; no  Vision: Due to be checked and will start measurements for cataract surgery  Dental: q 6 months  Generalized Safety in the home reviewed  Review Firearm safety as appropriate;  Assessed for community safety;yes Emergency Plan for illness or other Driving;  Sun protection         Objective:    Vitals: BP 120/70 mmHg  Pulse 50  Ht 5\' 9"  (1.753 m)  Wt 222 lb (100.699 kg)  BMI 32.77 kg/m2  SpO2 98%  Tobacco History  Smoking status  . Former Smoker -- 1.00 packs/day for 40 years  . Types: Cigarettes, Cigars  . Quit date: 09/18/2008  Smokeless tobacco  . Current User  . Types: Chew    Comment: Has Occasional Cigar/ quit      Ready to quit: Not Answered Counseling given: Not Answered Smoking cessation discussed with option for LDCT to r/o cancer.  Past Medical History  Diagnosis Date  . Hypertension   . Sinus bradycardia   . ED (erectile dysfunction)   . Arthropathy, unspecified, site unspecified   . Prostatitis, unspecified   . Personal history of other diseases of digestive system   . Allergic rhinitis due to pollen   . Benign neoplasm of colon   . Diverticulosis   . Skin cancer     basal and squamous cell  . Abnormal CT scan, stomach     a. Profound thickening of the fundus and cardia  of the stomach, suspicious for potential neoplasm by CT 02/2014. Referred to GI.  Marland Kitchen Atherosclerosis     a. Noted by abdominal CT 02/2014 (h/o normal nuc 2011).  . Habitual alcohol use   . Snoring     a. Pending OSA eval by pulm.  . Hyperlipidemia   . Obesity   . Shortness of breath 12/2013  . Atrial flutter 02/24/14    s/p ablation 11/15  . GERD (gastroesophageal reflux disease)   . PAF (paroxysmal atrial fibrillation)   . Cardiomyopathy     tachy mediated - a. TEE (10/15):  EF 30%;  b. Echo after NSR restored (10/15):  mild LVH, EF 55-60%, mild AS, mild AI, mild MR, mild to mod LAE, mild RAE  . Esophageal stricture   . Hiatal hernia   . Diverticulosis    Past Surgical History  Procedure Laterality Date  . Inguinal hernia repair      right  . Inguinal hernia repair      left  . Tonsillectomy    . Orif fracture of the elbow    . Squamous cell carcinoma excision    . Skin cancer excision  05/21/12    Squamous cell ca  . Hernia repair    . Tee without cardioversion N/A 02/24/2014    Procedure: TRANSESOPHAGEAL ECHOCARDIOGRAM (TEE);  Surgeon: Denice Bors  Stanford Breed, MD;  Location: [place];  Service: Cardiovascular;  Laterality: N/A;  . Cardioversion N/A 02/24/2014    Procedure: CARDIOVERSION;  Surgeon: Lelon Perla, MD;  Location: [facility] ENDOSCOPY;  Service: Cardiovascular;  Laterality: N/A;  . Ablation  03/19/2014    RFCA of atrial flutter by Dr Caryl Comes  . Esophagogastroduodenoscopy N/A 04/09/2014    Procedure: ESOPHAGOGASTRODUODENOSCOPY (EGD);  Surgeon: Inda Castle, MD;  Location: [place];  Service: Endoscopy;  Laterality: N/A;  . Atrial flutter ablation N/A 03/19/2014    Procedure: ATRIAL FLUTTER ABLATION;  Surgeon: Deboraha Sprang, MD;  Location: [facility] CATH LAB;  Service: Cardiovascular;  Laterality: N/A;  . Cardioversion Right 09/10/2014    Procedure: CARDIOVERSION;  Surgeon: Deboraha Sprang, MD;  Location: [place] CATH LAB;  Service: Cardiovascular;  Laterality: Right;   Family  History  Problem Relation Age of Onset  . Cancer Mother     ovarian  . Other Father     renal disease- grief over loss of spouse  . Cancer Brother     prostate- died of hematologic disorder 2nd to chemo  . Diabetes Neg Hx   . Coronary artery disease Neg Hx   . Colon cancer Neg Hx   . Stomach cancer Neg Hx   . Rectal cancer Neg Hx   . Pulmonary fibrosis Brother    History  Sexual Activity  . Sexual Activity: Not Currently    Outpatient Encounter Prescriptions as of 09/15/2014  . Order #: [id number] Class: Normal  . Order #: [id number] Class: Normal  . Order #: [id number] Class: Normal  . Order #: [id number] Class: Normal  . Order #: [id number] Class: Historical Med  . Order #: [id number] Class: Normal  . Order #: [id number] Class: Historical Med  . Order #: [id number] Class: Normal  . Order #: [id number] Class: Normal  . Order #: [id number] Class: Historical Med  . Order #: [id number] Class: Historical Med  . Order #: [id number] Class: Normal    Activities of Daily Living In your present state of health, do you have any difficulty performing the following activities: 09/15/2014 09/10/2014  Hearing? N N  Vision? N N  Difficulty concentrating or making decisions? N N  Walking or climbing stairs? N N  Dressing or bathing? N N  Doing errands, shopping? N -  Preparing Food and eating ? N -  Using the Toilet? N -  In the past six months, have you accidently leaked urine? N -  Do you have problems with loss of bowel control? N -  Managing your Medications? N -  Managing your Finances? N -  Housekeeping or managing your Housekeeping? N -    Patient Care Team: Janith Lima, MD as PCP - General (Internal Medicine) Franchot Gallo, MD (Urology) [name] [name], MD (Dermatology) Monna Fam, MD as Consulting Physician (Ophthalmology) Deboraha Sprang, MD as Consulting Physician (Cardiology)   Assessment:    CC risk: obesity (BMI); family hx; HTN; hyperlipidemia;  Personalized Education  given regarding: Pt determined a personalized goal to lose weight by controlling portions  Fall Risk and general Safety reviewed Assess fear of falling? no Educated on prevention falls; Exercise, toning and strengthening; Balance exercises;  Comfortable shoes Regular vision checks Home safety; removal of throw rugs; bathroom handrails;  Smoke detectors; safe environment Avoid climbing on BP medication ;    Any changes in medication ; will fup cardiologist Stress  1-5; stress 2.5    Risk for hepatitis or high risk social  behavior:non identified  Risk for Depression: Not verbalzied  Risk for Falls: no  Safety assessed (driving issues; vision; home; environment; support)  Cognition assessed by AD8; Score 0 (A score of 2 or greater would indicate the MMSE be completed)    Need for Immunizations or other screenings identified;  (CDC recommmend Prevnar at 65 followed by pnuemovax 23 in one year or 5 years after the last dose.  Health Maintenance up to date and a Preventive Wellness Plan was given to the patient  Exercise Activities and Dietary recommendations    Goals    . Weight < 200 lb (90.719 kg)     1. Will make apt to Dr in GI to fup on GERDs 2. Would like to lose weight to help heart; help  Would like to have more energy; Portion control; Eat a big breakfast; lunch; big supper.       Fall Risk Fall Risk  09/15/2014  Falls in the past year? No   Depression Screen PHQ 2/9 Scores 09/15/2014  PHQ - 2 Score 0    Cognitive Testing MMSE - Mini Mental State Exam 09/15/2014  Not completed: Unable to complete    Immunization History  Administered Date(s) Administered  . H1N1 04/28/2008  . Influenza Split 02/23/2011, 02/07/2012  . Influenza Whole 02/07/2008, 02/04/2009  . Influenza,inj,Quad PF,36+ Mos 02/07/2013, 02/03/2014  . Pneumococcal Conjugate-13 05/07/2013  . Pneumococcal Polysaccharide-23 03/20/2007  . Td 04/11/2008   Screening Tests Health Maintenance   Topic Date Due  . INFLUENZA VACCINE  12/15/2014  . TETANUS/TDAP  04/11/2018  . COLONOSCOPY  05/21/2019  . ZOSTAVAX  Addressed  . PNA vac Low Risk Adult  Completed      Plan:    Plan   The patient agrees to: Dr. Danie Chandler office called at this visit regarding low heart rate of 50 and rechecked at 48; no c/o but very tired; yawning Dr. Ronnald Ramp deferred to Dr. Jens Som. The patient understands Dr. Danie Chandler office will fup with him.   Will try to cut portions at his evening meal. Discussed [employer] and myfitnesspal All screens up to date  Advanced directive: will bring a copy HC directive to the office  Would like handicapped sticker and will fup and mail to him      During the course of the visit the patient was educated and counseled about the following appropriate screening and preventive services:   Vaccines to include Pneumoccal, Influenza, Hepatitis B, Td, Zostavax, up to date  Electrocardiogram - no but deferred to Dr. Jens Som  Cardiovascular Disease; fatique  Colorectal cancer screening; completed  Diabetes screening n/a  Prostate Cancer Screening; been discharged by urologist   Glaucoma screening; up to date  Nutrition counseling; discussed dietician services; will try portion control and drinking water  Smoking cessation counseling; Discussed LDCT and will discuss with the doctor   Patient Instructions (the written plan) was given to the patient.    GUYQI,HKVQQ, RN  09/15/2014   Medical screening examination/treatment/procedure(s) were performed by non-physician practitioner and as supervising physician I was immediately available for consultation/collaboration. I agree with above.  [name], MD

## 2014-09-15 NOTE — Patient Instructions (Addendum)
Donald [name] , Thank you for taking time to come for your Medicare Wellness Visit. I appreciate your ongoing commitment to your health goals. Please review the following plan we discussed and let me know if I can assist you in the future.   These are the goals we discussed: Goals    . Weight < 200 lb (90.719 kg)     1. Will make apt to Dr in GI to fup on GERDs 2. Would like to lose weight to help heart; help  Would like to have more energy; Portion control; Eat a big breakfast; lunch; big supper.        This is a list of the screening recommended for you and due dates:  Health Maintenance  Topic Date Due  . Flu Shot  12/15/2014  . Tetanus Vaccine  04/11/2018  . Colon Cancer Screening  05/21/2019  . Shingles Vaccine  Addressed  . Pneumonia vaccines  Completed   Plan;  Dr. Olin Pia office to fup regarding heart rate Would like to lose weight and will try portion control/ will review Build[employer].es or myfitnesspal  C/o of fatigue and will discuss with Caryl Comes Discussed PSA but does not feel it is needed at this time.  Will discuss LDCT as preventive screen with the doctor Will bring health care POA to the office MyPlate from USDA The general, healthful diet is based on the 2010 Dietary Guidelines for Americans. The amount of food you need to eat from each food group depends on your age, sex, and level of physical activity and can be individualized by a dietitian. Go to [email or web address] for more information. WHAT DO I NEED TO KNOW ABOUT THE [place] PLAN?  Enjoy your food, but eat less.   Avoid oversized portions.    of your plate should include fruits and vegetables.   of your plate should be grains.   of your plate should be protein. Grains  Make at least half of your grains whole grains.  For a 2,000 calorie daily food plan, eat 6 oz every day.  1 oz is about 1 slice bread, 1 cup cereal, or  cup cooked rice, cereal, or pasta. Vegetables  Make half your plate  fruits and vegetables.  For a 2,000 calorie daily food plan, eat 2 cups every day.  1 cup is about 1 cup raw or cooked vegetables or vegetable juice or 2 cups raw leafy greens. Fruits  Make half your plate fruits and vegetables.  For a 2,000 calorie daily food plan, eat 2 cups every day.  1 cup is about 1 cup fruit or 100% fruit juice or  cup dried fruit. Protein  For a 2,000 calorie daily food plan, eat 5 oz every day.  1 oz is about 1 oz meat, poultry, or fish,  cup cooked beans, 1 egg, 1 Tbsp peanut butter, or  oz nuts or seeds. Dairy  Switch to fat-free or low-fat (1%) milk.  For a 2,000 calorie daily food plan, eat 3 cups every day.  1 cup is about 1 cup milk or yogurt or soy milk (soy beverage), 1 oz natural cheese, or 2 oz processed cheese. Fats, Oils, and Empty Calories  Only small amounts of oils are recommended.  Empty calories are calories from solid fats or added sugars.  Compare sodium in foods like soup, bread, and frozen meals. Choose the foods with lower numbers.  Drink water instead of sugary drinks. WHAT FOODS CAN I EAT? Grains Whole grains such as whole wheat,  quinoa, millet, and bulgur. Bread, rolls, and pasta made from whole grains. Brown or wild rice. Hot or cold cereals made from whole grains and without added sugar. Vegetables All fresh vegetables, especially fresh red, dark green, or orange vegetables. Peas and beans. Low-sodium frozen or canned vegetables prepared without added salt. Low-sodium vegetable juices. Fruits All fresh, frozen, and dried fruits. Canned fruit packed in water or fruit juice without added sugar. Fruit juices without added sugar. Meats and Other Protein Sources Boiled, baked, or grilled lean meat trimmed of fat. Skinless poultry. Fresh seafood and shellfish. Canned seafood packed in water. Unsalted nuts and unsalted nut butters. Tofu. Dried beans and pea. Eggs. Dairy Low-fat or fat-free milk, yogurt, and cheeses.    Sweets and Desserts Frozen desserts made from low-fat milk. Fats and Oils Olive, peanut, and canola oils and margarine. Salad dressing and mayonnaise made from these oils. Other Soups and casseroles made from allowed ingredients and without added fat or salt. The items listed above may not be a complete list of recommended foods or beverages. Contact your dietitian for more options.  WHAT FOODS ARE NOT RECOMMENDED? Grains Sweetened, low-fiber cereals. Packaged baked goods. Snack crackers and chips. Cheese crackers, butter crackers, and biscuits. Frozen waffles, sweet breads, doughnuts, pastries, packaged baking mixes, pancakes, cakes, and cookies. Vegetables Regular canned or frozen vegetables or vegetables prepared with salt. Canned tomatoes. Canned tomato sauce. Fried vegetables. Vegetables in cream sauce or cheese sauce. Fruits Fruits packed in syrup or made with added sugar.  Meats and Other Protein Sources Marbled or fatty meats such as ribs. Poultry with skin. Fried meats, poultry, eggs, or fish. Sausages, hot dogs, and deli meats such as pastrami, bologna, or salami. Dairy Whole milk, cream, cheeses made from whole milk, sour cream. Ice cream or yogurt made from whole milk or with added sugar. Beverages For adults, no more than one alcoholic drink per day. Regular soft drinks or other sugary beverages. Juice drinks. Sweets and Desserts Sugary or fatty desserts, candy, and other sweets. Fats and Oils Solid shortening or partially hydrogenated oils. Solid margarine. Margarine that contains trans fats. Butter. The items listed above may not be a complete list of foods and beverages to avoid. Contact your dietitian for more information. Document Released: 05/22/2007 Document Revised: 09/16/2013 Document Reviewed: 04/10/2013 [facility] Patient Information 2015 [place], [place]. This information is not intended to replace advice given to you by your health care provider. Make sure you  discuss any questions you have with your health care provider.   Health Maintenance A healthy lifestyle and preventative care can promote health and wellness.  Maintain regular health, dental, and eye exams.  Eat a healthy diet. Foods like vegetables, fruits, whole grains, low-fat dairy products, and lean protein foods contain the nutrients you need and are low in calories. Decrease your intake of foods high in solid fats, added sugars, and salt. Get information about a proper diet from your health care provider, if necessary.  Regular physical exercise is one of the most important things you can do for your health. Most adults should get at least 150 minutes of moderate-intensity exercise (any activity that increases your heart rate and causes you to sweat) each week. In addition, most adults need muscle-strengthening exercises on 2 or more days a week.   Maintain a healthy weight. The body mass index (BMI) is a screening tool to identify possible weight problems. It provides an estimate of body fat based on height and weight. Your health care provider  can find your BMI and can help you achieve or maintain a healthy weight. For males 20 years and older:  A BMI below 18.5 is considered underweight.  A BMI of 18.5 to 24.9 is normal.  A BMI of 25 to 29.9 is considered overweight.  A BMI of 30 and above is considered obese.  Maintain normal blood lipids and cholesterol by exercising and minimizing your intake of saturated fat. Eat a balanced diet with plenty of fruits and vegetables. Blood tests for lipids and cholesterol should begin at age 57 and be repeated every 5 years. If your lipid or cholesterol levels are high, you are over age 52, or you are at high risk for heart disease, you may need your cholesterol levels checked more frequently.Ongoing high lipid and cholesterol levels should be treated with medicines if diet and exercise are not working.  If you smoke, find out from your health  care provider how to quit. If you do not use tobacco, do not start.  Lung cancer screening is recommended for adults aged 69-80 years who are at high risk for developing lung cancer because of a history of smoking. A yearly low-dose CT scan of the lungs is recommended for people who have at least a 30-pack-year history of smoking and are current smokers or have quit within the past 15 years. A pack year of smoking is smoking an average of 1 pack of cigarettes a day for 1 year (for example, a 30-pack-year history of smoking could mean smoking 1 pack a day for 30 years or 2 packs a day for 15 years). Yearly screening should continue until the smoker has stopped smoking for at least 15 years. Yearly screening should be stopped for people who develop a health problem that would prevent them from having lung cancer treatment.  If you choose to drink alcohol, do not have more than 2 drinks per day. One drink is considered to be 12 oz (360 mL) of beer, 5 oz (150 mL) of wine, or 1.5 oz (45 mL) of liquor.  Avoid the use of street drugs. Do not share needles with anyone. Ask for help if you need support or instructions about stopping the use of drugs.  High blood pressure causes heart disease and increases the risk of stroke. Blood pressure should be checked at least every 1-2 years. Ongoing high blood pressure should be treated with medicines if weight loss and exercise are not effective.  If you are 26-78 years old, ask your health care provider if you should take aspirin to prevent heart disease.  Diabetes screening involves taking a blood sample to check your fasting blood sugar level. This should be done once every 3 years after age 76 if you are at a normal weight and without risk factors for diabetes. Testing should be considered at a younger age or be carried out more frequently if you are overweight and have at least 1 risk factor for diabetes.  Colorectal cancer can be detected and often prevented.  Most routine colorectal cancer screening begins at the age of 59 and continues through age 101. However, your health care provider may recommend screening at an earlier age if you have risk factors for colon cancer. On a yearly basis, your health care provider may provide home test kits to check for hidden blood in the stool. A small camera at the end of a tube may be used to directly examine the colon (sigmoidoscopy or colonoscopy) to detect the earliest forms of  colorectal cancer. Talk to your health care provider about this at age 27 when routine screening begins. A direct exam of the colon should be repeated every 5-10 years through age 79, unless early forms of precancerous polyps or small growths are found.  People who are at an increased risk for hepatitis B should be screened for this virus. You are considered at high risk for hepatitis B if:  You were born in a country where hepatitis B occurs often. Talk with your health care provider about which countries are considered high risk.  Your parents were born in a high-risk country and you have not received a shot to protect against hepatitis B (hepatitis B vaccine).  You have HIV or AIDS.  You use needles to inject street drugs.  You live with, or have sex with, someone who has hepatitis B.  You are a man who has sex with other men (MSM).  You get hemodialysis treatment.  You take certain medicines for conditions like cancer, organ transplantation, and autoimmune conditions.  Hepatitis C blood testing is recommended for all people born from [birth year] through 1963 and any individual with known risk factors for hepatitis C.  Healthy men should no longer receive prostate-specific antigen (PSA) blood tests as part of routine cancer screening. Talk to your health care provider about prostate cancer screening.  Testicular cancer screening is not recommended for adolescents or adult males who have no symptoms. Screening includes self-exam, a health  care provider exam, and other screening tests. Consult with your health care provider about any symptoms you have or any concerns you have about testicular cancer.  Practice safe sex. Use condoms and avoid high-risk sexual practices to reduce the spread of sexually transmitted infections (STIs).  You should be screened for STIs, including gonorrhea and chlamydia if:  You are sexually active and are younger than 24 years.  You are older than 24 years, and your health care provider tells you that you are at risk for this type of infection.  Your sexual activity has changed since you were last screened, and you are at an increased risk for chlamydia or gonorrhea. Ask your health care provider if you are at risk.  If you are at risk of being infected with HIV, it is recommended that you take a prescription medicine daily to prevent HIV infection. This is called pre-exposure prophylaxis (PrEP). You are considered at risk if:  You are a man who has sex with other men (MSM).  You are a heterosexual man who is sexually active with multiple partners.  You take drugs by injection.  You are sexually active with a partner who has HIV.  Talk with your health care provider about whether you are at high risk of being infected with HIV. If you choose to begin PrEP, you should first be tested for HIV. You should then be tested every 3 months for as long as you are taking PrEP.  Use sunscreen. Apply sunscreen liberally and repeatedly throughout the day. You should seek shade when your shadow is shorter than you. Protect yourself by wearing long sleeves, pants, a wide-brimmed hat, and sunglasses year round whenever you are outdoors.  Tell your health care provider of new moles or changes in moles, especially if there is a change in shape or color. Also, tell your health care provider if a mole is larger than the size of a pencil eraser.  A one-time screening for abdominal aortic aneurysm (AAA) and surgical  repair of large  AAAs by ultrasound is recommended for men aged 12-75 years who are current or former smokers.  Stay current with your vaccines (immunizations). Document Released: 10/29/2007 Document Revised: 05/07/2013 Document Reviewed: 09/27/2010 [facility] Patient Information 2015 [place], [place]. This information is not intended to replace advice given to you by your health care provider. Make sure you discuss any questions you have with your health care provider.

## 2014-09-15 NOTE — Telephone Encounter (Signed)
This encounter was created in error - please disregard.

## 2014-09-15 NOTE — Telephone Encounter (Signed)
Patient denies symptomatic issues with low HRs.  We reviewed that Dr. Caryl Comes is aware of low HRs and that as long as he remains asymptomatic it is not worrisome. Patient advised and agreed to call if symptoms arise.

## 2014-09-15 NOTE — Telephone Encounter (Signed)
New Message  PT RETURNING SHERRI's PHONE CALL. Please call back and discuss.

## 2014-10-08 ENCOUNTER — Encounter: Payer: Self-pay | Admitting: Internal Medicine

## 2014-10-08 ENCOUNTER — Ambulatory Visit (INDEPENDENT_AMBULATORY_CARE_PROVIDER_SITE_OTHER): Payer: PPO | Admitting: Internal Medicine

## 2014-10-08 VITALS — BP 120/70 | HR 51 | Ht 69.0 in | Wt 220.0 lb

## 2014-10-08 DIAGNOSIS — I480 Paroxysmal atrial fibrillation: Secondary | ICD-10-CM

## 2014-10-08 DIAGNOSIS — R0789 Other chest pain: Secondary | ICD-10-CM | POA: Diagnosis not present

## 2014-10-08 NOTE — Progress Notes (Signed)
Patient Care Team: Janith Lima, MD as PCP - General (Internal Medicine) Franchot Gallo, MD (Urology) [name] [name], MD (Dermatology) Monna Fam, MD as Consulting Physician (Ophthalmology) Deboraha Sprang, MD as Consulting Physician (Cardiology)   HPI  Donald [name] is a 81 y.o. male Seem in followup of ablation of atrial flutter done by me 10/15.  He was seen November 2015 with evidence of rapid atrial fibrillation with a course flutter-like appearance.  This recurred 4/16.  He has had problems with sinus bradycardia.   April 16 he was noted to have recurrent atrial fibrillation and underwent cardioversion. He was unaware of his tachycardia. He noted no change in his functional status with the atrial fibrillation.    October 2015 Echo - Left ventricle: The cavity size was normal. Wall thickness was increased in a pattern of mild LVH. Systolic function was normal. The estimated ejection fraction was in the range of 55% to 60%.<<30% (10/15)  Aortic valve: Heavily calcirfied right coronary cusp. There was mild stenosis. There was mild regurgitation.BICUSPID mean gradient was 10.  - Mitral valve: There was mild regurgitation. - Left atrium: The atrium was mildly to moderately dilated. - Right atrium: The atrium was mildly dilated. - Atrial septum: No defect or patent foramen ovale was identified.    He has a number of complaints today. The first is peripheral edema. He also is noted exercise intolerance accompanied by some chest discomfort. This typically lasts minutes and resolves with rest and is increasingly problematic compared to 6 months ago.  He notes no change in exercise tolerance related to his cardioversion. He continues to struggle with this.  He has complaints of tingling on the hands and feet.   Normal nuclear study 2011.  Past Medical History  Diagnosis Date  . Hypertension   . Sinus bradycardia   . ED (erectile dysfunction)   .  Arthropathy, unspecified, site unspecified   . Prostatitis, unspecified   . Personal history of other diseases of digestive system   . Allergic rhinitis due to pollen   . Benign neoplasm of colon   . Diverticulosis   . Skin cancer     basal and squamous cell  . Abnormal CT scan, stomach     a. Profound thickening of the fundus and cardia of the stomach, suspicious for potential neoplasm by CT 02/2014. Referred to GI.  Marland Kitchen Atherosclerosis     a. Noted by abdominal CT 02/2014 (h/o normal nuc 2011).  . Habitual alcohol use   . Snoring     a. Pending OSA eval by pulm.  . Hyperlipidemia   . Obesity   . Shortness of breath 12/2013  . Atrial flutter 02/24/14    s/p ablation 11/15  . GERD (gastroesophageal reflux disease)   . PAF (paroxysmal atrial fibrillation)   . Cardiomyopathy     tachy mediated - a. TEE (10/15):  EF 30%;  b. Echo after NSR restored (10/15):  mild LVH, EF 55-60%, mild AS, mild AI, mild MR, mild to mod LAE, mild RAE  . Esophageal stricture   . Hiatal hernia   . Diverticulosis     Past Surgical History  Procedure Laterality Date  . Inguinal hernia repair      right  . Inguinal hernia repair      left  . Tonsillectomy    . Orif fracture of the elbow    . Squamous cell carcinoma excision    . Skin cancer excision  05/21/12  Squamous cell ca  . Hernia repair    . Tee without cardioversion N/A 02/24/2014    Procedure: TRANSESOPHAGEAL ECHOCARDIOGRAM (TEE);  Surgeon: Lelon Perla, MD;  [facility] ENDOSCOPY;  Service: Cardiovascular;  Laterality: N/A;  . Cardioversion N/A 02/24/2014    Procedure: CARDIOVERSION;  Surgeon: Lelon Perla, MD;  Location: [facility] ENDOSCOPY;  Service: Cardiovascular;  Laterality: N/A;  . Ablation  03/19/2014    RFCA of atrial flutter by Dr Caryl Comes  . Esophagogastroduodenoscopy N/A 04/09/2014    Procedure: ESOPHAGOGASTRODUODENOSCOPY (EGD);  Surgeon: Inda Castle, MD;  Location: [place];  Service: Endoscopy;  Laterality: N/A;  .  Atrial flutter ablation N/A 03/19/2014    Procedure: ATRIAL FLUTTER ABLATION;  Surgeon: Deboraha Sprang, MD;  Location: [facility] CATH LAB;  Service: Cardiovascular;  Laterality: N/A;  . Cardioversion Right 09/10/2014    Procedure: CARDIOVERSION;  Surgeon: Deboraha Sprang, MD;  Location: [facility] CATH LAB;  Service: Cardiovascular;  Laterality: Right;    Current Outpatient Prescriptions  Medication Sig Dispense Refill  . apixaban (ELIQUIS) 5 MG TABS tablet Take 1 tablet (5 mg total) by mouth 2 (two) times daily. Start in the morning on 04/11/14 60 tablet 11  . atorvastatin (LIPITOR) 40 MG tablet Take 1 tablet (40 mg total) by mouth daily. 90 tablet 3  . diltiazem (CARDIZEM CD) 240 MG 24 hr capsule Take 1 capsule (240 mg total) by mouth daily. 30 capsule 6  . finasteride (PROSCAR) 5 MG tablet Take 1 tablet (5 mg total) by mouth daily. 30 tablet 11  . fluticasone (FLONASE) 50 MCG/ACT nasal spray Place 1 spray into both nostrils daily as needed for allergies.    . furosemide (LASIX) 40 MG tablet Take 40 mg by mouth daily.    Marland Kitchen loratadine (CLARITIN) 10 MG tablet Take 10 mg by mouth daily.     Marland Kitchen losartan (COZAAR) 100 MG tablet Take 1 tablet (100 mg total) by mouth daily. 90 tablet 3  . metoprolol tartrate (LOPRESSOR) 25 MG tablet Take 0.5 tablets (12.5 mg total) by mouth 2 (two) times daily. 30 tablet 11  . Multiple Vitamins-Minerals (EYE VITAMINS PO) Take 1 tablet by mouth 2 (two) times daily. Macular Supplement    . Multiple Vitamins-Minerals (MULTIVITAMIN GUMMIES ADULT PO) Take 1 tablet by mouth 2 (two) times daily.    . pantoprazole (PROTONIX) 20 MG tablet Take 1 tablet (20 mg total) by mouth daily. 30 tablet 3   No current facility-administered medications for this visit.    No Known Allergies  Review of Systems negative except from HPI and PMH  Physical Exam There were no vitals taken for this visit. Well developed and well nourished in no acute distress HENT normal E scleral and icterus clear Neck  Supple JVP 9-10 cm carotids brisk and full Clear to ausculation .Irregularly Irregular rate and rhythm with fast ventricular response  no murmurs gallops or rub Soft with active bowel sounds No clubbing cyanosis Trace Edema Alert and oriented, grossly normal motor and sensory function Skin Warm and Dry  ECG demonstrates   Sinus rhythm at 51 Intervals 15/09/44 Otherwise normal  Assessment and  Plan  Atrial fibrillation/flutter-paroxysmal  Cardiomyopathy-resolved  Hypertension  Exercise intolerance  Hyperlipidemia  Exertional chest discomfort consistent with angina  Bradycardia  Increased appetite  HFpEF  Blood pressure is well-controlled  There is evidence of volume overloaded manifested by JVD in the little bit of edema. This was present previously in the context of atrial fibrillation and I thought that  it might be related to that. However, it persists. It is thus not likely related to Cardizem. We will increase his diuretics from 40 daily--80/40 to see if we can get a vigorous diuresis. We will do this for 10 days.  I'm concerned about his chest discomfort. His pretest likelihood are relatively high given   age and hypertension and hyperlipidemia. We will undertake Myoview scanning but will have a very low threshold for pursuing catheterization. I have reviewed this with him.  We will do this with stress testing; this will help [place] get an idea of chronotropic competence. I'm not sure that impair sinus function however is sufficient to explain his symptoms as he has a heart rate in 90s while exercising.  His exercise intolerance may be related to chronotropic incompetence, ischemia conditioning or obesity. It also may be related to drugs and we will discontinue his Cardizem at this juncture.  He is also having complaints of increased appetite. Literature review suggested that both ELIQUIS and metoprolol can rarely be associated with this. We will take a two-week exclusion  trial of each of these. He will be afforded an alternative NOAC for that period of time. He will let us know.  Presence of coronary disease will inform the need for adjusting of his antilipid therapy  He also has complaints of periperhral tingling. This involved both his hands and his feet and I suggested that he follow-up with his PCP regarding this.

## 2014-10-08 NOTE — Patient Instructions (Addendum)
Medication Instructions:  Your physician has recommended you make the following change in your medication:  1) STOP Apixaban (Eliquis) 2) START Xarelto 20 mg daily  -- please call office if this seems to help with appetite issue. 3) STOP Diltiazem 4) STOP Metoprolol Tartrate  -- stop this in 2 weeks if no improvement in appetite 5) CHANGE how you take Furosemide - take 80 mg one day, take 40 mg the next day - alternate this   Labwork: None ordered  Testing/Procedures: Your physician has requested that you have a stress myoview. For further information please visit [email or web address]. Please follow instruction sheet as given.  Follow-Up: Your physician recommends that you schedule a follow-up appointment in: 4 weeks with Dr. Caryl Comes on 11/07/14 at 12:30 pm.  Thank you for choosing [facility]!!

## 2014-10-21 ENCOUNTER — Telehealth (HOSPITAL_COMMUNITY): Payer: Self-pay | Admitting: Radiology

## 2014-10-21 ENCOUNTER — Telehealth (HOSPITAL_COMMUNITY): Payer: Self-pay | Admitting: *Deleted

## 2014-10-21 NOTE — Telephone Encounter (Signed)
Patient given detailed instructions per Myocardial Perfusion Study Information Sheet for test on 10/23/14 at 0700. Patient verbalized understanding. Rich Number

## 2014-10-21 NOTE — Telephone Encounter (Signed)
Patient given detailed instructions per Myocardial Perfusion Study Information Sheet for test on 10/23/2014 at 7:00am. Patient verbalized understanding. E.Kubak, CNMT

## 2014-10-21 NOTE — Telephone Encounter (Signed)
Left message on voicemail in reference to upcoming appointment scheduled for 10/23/14. Phone number given for a call back so details instructions can be given. Burwell Bethel, [name]

## 2014-10-21 NOTE — Telephone Encounter (Signed)
Left message on voicemail in reference to upcoming appointment scheduled for 10/23/14. Phone number given for a call back so details instructions can be given. Donald [name], [name]

## 2014-10-23 ENCOUNTER — Ambulatory Visit (HOSPITAL_COMMUNITY): Payer: PPO | Attending: Internal Medicine

## 2014-10-23 DIAGNOSIS — R0789 Other chest pain: Secondary | ICD-10-CM | POA: Insufficient documentation

## 2014-10-23 DIAGNOSIS — R0609 Other forms of dyspnea: Secondary | ICD-10-CM | POA: Diagnosis not present

## 2014-10-23 DIAGNOSIS — I10 Essential (primary) hypertension: Secondary | ICD-10-CM | POA: Diagnosis not present

## 2014-10-23 DIAGNOSIS — R001 Bradycardia, unspecified: Secondary | ICD-10-CM | POA: Diagnosis not present

## 2014-10-23 LAB — MYOCARDIAL PERFUSION IMAGING
CHL CUP MPHR: 141 {beats}/min
CHL RATE OF PERCEIVED EXERTION: 17
CSEPEDS: 0 s
CSEPEW: 7 METS
Exercise duration (min): 6 min
LV dias vol: 123 mL
LV sys vol: 46 mL
Nuc Stress EF: 63 %
Peak HR: 131 {beats}/min
Percent HR: 92 %
RATE: 0.47
Rest HR: 48 {beats}/min
SDS: 1
SRS: 3
SSS: 4
TID: 0.92

## 2014-10-23 MED ORDER — TECHNETIUM TC 99M SESTAMIBI GENERIC - CARDIOLITE
33.0000 | Freq: Once | INTRAVENOUS | Status: AC | PRN
  Administered 2014-10-23: 33 via INTRAVENOUS

## 2014-10-23 MED ORDER — TECHNETIUM TC 99M SESTAMIBI GENERIC - CARDIOLITE
11.0000 | Freq: Once | INTRAVENOUS | Status: AC | PRN
  Administered 2014-10-23: 11 via INTRAVENOUS

## 2014-11-04 ENCOUNTER — Encounter: Payer: Self-pay | Admitting: Internal Medicine

## 2014-11-04 ENCOUNTER — Ambulatory Visit (INDEPENDENT_AMBULATORY_CARE_PROVIDER_SITE_OTHER): Payer: PPO | Admitting: Internal Medicine

## 2014-11-04 ENCOUNTER — Other Ambulatory Visit: Payer: Self-pay | Admitting: Internal Medicine

## 2014-11-04 VITALS — BP 112/66 | HR 59 | Ht 69.0 in | Wt 219.4 lb

## 2014-11-04 DIAGNOSIS — I480 Paroxysmal atrial fibrillation: Secondary | ICD-10-CM

## 2014-11-04 MED ORDER — NITROGLYCERIN 0.4 MG SL SUBL: 0.4000 mg | SUBLINGUAL_TABLET | SUBLINGUAL | Status: DC | PRN

## 2014-11-04 MED ORDER — APIXABAN 5 MG PO TABS: 5.0000 mg | ORAL_TABLET | Freq: Two times a day (BID) | ORAL | Status: DC

## 2014-11-04 NOTE — Patient Instructions (Signed)
Medication Instructions:  Your physician has recommended you make the following change in your medication:  1) STOP Metoprolol 2) STOP Xarelto 3) RESTART Eliquis 5 mg twice daily 4) START Nitroglycerin Sublingual -  Please see instructions below on taking this medication   Labwork: None ordered  Testing/Procedures: None ordered  Follow-Up: Your physician recommends that you schedule a follow-up appointment in: 6-8 weeks with Dr. Caryl Comes.   Any Other Special Instructions Will Be Listed Below (If Applicable).  Nitroglycerin sublingual tablets What is this medicine? NITROGLYCERIN (nye troe GLI ser in) is a type of vasodilator. It relaxes blood vessels, increasing the blood and oxygen supply to your heart. This medicine is used to relieve chest pain caused by angina. It is also used to prevent chest pain before activities like climbing stairs, going outdoors in cold weather, or sexual activity. This medicine may be used for other purposes; ask your health care provider or pharmacist if you have questions. COMMON BRAND NAME(S): Nitroquick, Nitrostat, Nitrotab What should I tell my health care provider before I take this medicine? They need to know if you have any of these conditions: -anemia -head injury, recent stroke, or bleeding in the brain -liver disease -previous heart attack -an unusual or allergic reaction to nitroglycerin, other medicines, foods, dyes, or preservatives -pregnant or trying to get pregnant -breast-feeding How should I use this medicine? Take this medicine by mouth as needed. At the first sign of an angina attack (chest pain or tightness) place one tablet under your tongue. You can also take this medicine 5 to 10 minutes before an event likely to produce chest pain. Follow the directions on the prescription label. Let the tablet dissolve under the tongue. Do not swallow whole. Replace the dose if you accidentally swallow it. It will help if your mouth is not dry.  Saliva around the tablet will help it to dissolve more quickly. Do not eat or drink, smoke or chew tobacco while a tablet is dissolving. If you are not better within 5 minutes after taking ONE dose of nitroglycerin, call 9-1-1 immediately to seek emergency medical care. Do not take more than 3 nitroglycerin tablets over 15 minutes. If you take this medicine often to relieve symptoms of angina, your doctor or health care professional may provide you with different instructions to manage your symptoms. If symptoms do not go away after following these instructions, it is important to call 9-1-1 immediately. Do not take more than 3 nitroglycerin tablets over 15 minutes. Talk to your pediatrician regarding the use of this medicine in children. Special care may be needed. Overdosage: If you think you have taken too much of this medicine contact a poison control center or emergency room at once. NOTE: This medicine is only for you. Do not share this medicine with others. What if I miss a dose? This does not apply. This medicine is only used as needed. What may interact with this medicine? Do not take this medicine with any of the following medications: -certain migraine medicines like ergotamine and dihydroergotamine (DHE) -medicines used to treat erectile dysfunction like sildenafil, tadalafil, and vardenafil -riociguat This medicine may also interact with the following medications: -alteplase -aspirin -heparin -medicines for high blood pressure -medicines for mental depression -other medicines used to treat angina -phenothiazines like chlorpromazine, mesoridazine, prochlorperazine, thioridazine This list may not describe all possible interactions. Give your health care provider a list of all the medicines, herbs, non-prescription drugs, or dietary supplements you use. Also tell them if you smoke,  drink alcohol, or use illegal drugs. Some items may interact with your medicine. What should I watch for  while using this medicine? Tell your doctor or health care professional if you feel your medicine is no longer working. Keep this medicine with you at all times. Sit or lie down when you take your medicine to prevent falling if you feel dizzy or faint after using it. Try to remain calm. This will help you to feel better faster. If you feel dizzy, take several deep breaths and lie down with your feet propped up, or bend forward with your head resting between your knees. You may get drowsy or dizzy. Do not drive, use machinery, or do anything that needs mental alertness until you know how this drug affects you. Do not stand or sit up quickly, especially if you are an older patient. This reduces the risk of dizzy or fainting spells. Alcohol can make you more drowsy and dizzy. Avoid alcoholic drinks. Do not treat yourself for coughs, colds, or pain while you are taking this medicine without asking your doctor or health care professional for advice. Some ingredients may increase your blood pressure. What side effects may I notice from receiving this medicine? Side effects that you should report to your doctor or health care professional as soon as possible: -blurred vision -dry mouth -skin rash -sweating -the feeling of extreme pressure in the head -unusually weak or tired Side effects that usually do not require medical attention (report to your doctor or health care professional if they continue or are bothersome): -flushing of the face or neck -headache -irregular heartbeat, palpitations -nausea, vomiting This list may not describe all possible side effects. Call your doctor for medical advice about side effects. You may report side effects to FDA at 1-800-FDA-1088. Where should I keep my medicine? Keep out of the reach of children. Store at room temperature between 20 and 25 degrees C (68 and 77 degrees F). Store in [employer]. Protect from light and moisture. Keep tightly closed. Throw  away any unused medicine after the expiration date. NOTE: This sheet is a summary. It may not cover all possible information. If you have questions about this medicine, talk to your doctor, pharmacist, or health care provider.  2015, Elsevier/Gold Standard. (2013-02-28 17:57:36)

## 2014-11-04 NOTE — Progress Notes (Signed)
Patient Care Team: Janith Lima, MD as PCP - General (Internal Medicine) Franchot Gallo, MD (Urology) [name] [name], MD (Dermatology) Monna Fam, MD as Consulting Physician (Ophthalmology) Deboraha Sprang, MD as Consulting Physician (Cardiology)   HPI  Donald [name] is a 81 y.o. male Seem in followup of ablation of atrial flutter done by me 10/15.  He was seen November 2015 with evidence of rapid atrial fibrillation with a course flutter-like appearance.  This recurred 4/16.  He has had problems with sinus bradycardia.   April 16 he was noted to have recurrent atrial fibrillation and underwent cardioversion. He was unaware of his tachycardia. He noted no change in his functional status with the atrial fibrillation.    October 2015 Echo - Left ventricle: The cavity size was normal. Wall thickness was increased in a pattern of mild LVH. Systolic function was normal. The estimated ejection fraction was in the range of 55% to 60%.<<30% (10/15)  Aortic valve: Heavily calcirfied right coronary cusp. There was mild stenosis. There was mild regurgitation.BICUSPID mean gradient was 10.  - Mitral valve: There was mild regurgitation. - Left atrium: The atrium was mildly to moderately dilated. - Right atrium: The atrium was mildly dilated. - Atrial septum: No defect or patent foramen ovale was identified.   Following cardioversion there is no improvement in exercise tolerance. We then undertook a Myoview scan demonstrating no significant ischemia. Ejection fraction was normal.     Past Medical History  Diagnosis Date  . Hypertension   . Sinus bradycardia   . ED (erectile dysfunction)   . Arthropathy, unspecified, site unspecified   . Prostatitis, unspecified   . Personal history of other diseases of digestive system   . Allergic rhinitis due to pollen   . Benign neoplasm of colon   . Diverticulosis   . Skin cancer     basal and squamous cell  . Abnormal CT  scan, stomach     a. Profound thickening of the fundus and cardia of the stomach, suspicious for potential neoplasm by CT 02/2014. Referred to GI.  Marland Kitchen Atherosclerosis     a. Noted by abdominal CT 02/2014 (h/o normal nuc 2011).  . Habitual alcohol use   . Snoring     a. Pending OSA eval by pulm.  . Hyperlipidemia   . Obesity   . Shortness of breath 12/2013  . Atrial flutter 02/24/14    s/p ablation 11/15  . GERD (gastroesophageal reflux disease)   . PAF (paroxysmal atrial fibrillation)   . Cardiomyopathy     tachy mediated - a. TEE (10/15):  EF 30%;  b. Echo after NSR restored (10/15):  mild LVH, EF 55-60%, mild AS, mild AI, mild MR, mild to mod LAE, mild RAE  . Esophageal stricture   . Hiatal hernia   . Diverticulosis     Past Surgical History  Procedure Laterality Date  . Inguinal hernia repair      right  . Inguinal hernia repair      left  . Tonsillectomy    . Orif fracture of the elbow    . Squamous cell carcinoma excision    . Skin cancer excision  05/21/12    Squamous cell ca  . Hernia repair    . Tee without cardioversion N/A 02/24/2014    Procedure: TRANSESOPHAGEAL ECHOCARDIOGRAM (TEE);  Surgeon: Lelon Perla, MD;  Location: [place];  Service: Cardiovascular;  Laterality: N/A;  . Cardioversion N/A 02/24/2014  Procedure: CARDIOVERSION;  Surgeon: Lelon Perla, MD;  Location: [facility] ENDOSCOPY;  Service: Cardiovascular;  Laterality: N/A;  . Ablation  03/19/2014    RFCA of atrial flutter by Dr Caryl Comes  . Esophagogastroduodenoscopy N/A 04/09/2014    Procedure: ESOPHAGOGASTRODUODENOSCOPY (EGD);  Surgeon: Inda Castle, MD;  Location: [place];  Service: Endoscopy;  Laterality: N/A;  . Atrial flutter ablation N/A 03/19/2014    Procedure: ATRIAL FLUTTER ABLATION;  Surgeon: Deboraha Sprang, MD;  Location: [facility] CATH LAB;  Service: Cardiovascular;  Laterality: N/A;  . Cardioversion Right 09/10/2014    Procedure: CARDIOVERSION;  Surgeon: Deboraha Sprang, MD;  Location:  [facility] CATH LAB;  Service: Cardiovascular;  Laterality: Right;    Current Outpatient Prescriptions  Medication Sig Dispense Refill  . atorvastatin (LIPITOR) 40 MG tablet Take 1 tablet (40 mg total) by mouth daily. 90 tablet 3  . finasteride (PROSCAR) 5 MG tablet Take 1 tablet (5 mg total) by mouth daily. 30 tablet 11  . fluticasone (FLONASE) 50 MCG/ACT nasal spray Place 1 spray into both nostrils daily as needed for allergies.    . furosemide (LASIX) 40 MG tablet TAKE 1 TABLET ONCE DAILY. 30 tablet 0  . loratadine (CLARITIN) 10 MG tablet Take 10 mg by mouth daily as needed for allergies.     Marland Kitchen losartan (COZAAR) 100 MG tablet Take 1 tablet (100 mg total) by mouth daily. 90 tablet 3  . metoprolol tartrate (LOPRESSOR) 25 MG tablet Take 0.5 tablets (12.5 mg total) by mouth 2 (two) times daily. 30 tablet 11  . Multiple Vitamins-Minerals (EYE VITAMINS PO) Take 1 tablet by mouth 2 (two) times daily. Macular Supplement    . Multiple Vitamins-Minerals (MULTIVITAMIN GUMMIES ADULT PO) Take 1 tablet by mouth 2 (two) times daily.    . pantoprazole (PROTONIX) 20 MG tablet Take 1 tablet (20 mg total) by mouth daily. 30 tablet 3  . rivaroxaban (XARELTO) 20 MG TABS tablet Take 20 mg by mouth daily with supper.     No current facility-administered medications for this visit.    No Known Allergies  Review of Systems negative except from HPI and PMH  Physical Exam BP 112/66 mmHg  Pulse 59  Ht 5\' 9"  (1.753 m)  Wt 219 lb 6.4 oz (99.519 kg)  BMI 32.38 kg/m2  SpO2 96% Well developed and well nourished in no acute distress HENT normal E scleral and icterus clear Neck Supple JVP 7-8 cm carotids brisk and full Clear to ausculation RRR without murmurs Soft with active bowel sounds No clubbing cyanosis Trace Edema Alert and oriented, grossly normal motor and sensory function Skin Warm and Dry     Assessment and  Plan  Atrial  fibrillation/flutter-paroxysmal  Cardiomyopathy-resolved  Hypertension  Exercise intolerance  Hyperlipidemia  Exertional chest discomfort consistent with angina  Bradycardia  HFpEF   He continues to have some degree of bradycardia and we will discontinue his metoprolol.  He and I reviewed his Myoview images together. There is a baseline defect with mild worsening with stress. His symptoms however are his issue and we discussed alternatives to stenting as relates to symptom relief. We have elected to pursue a nitroglycerin sublingual trial. He will use this before he goes walking which has been his major limitation. We will review this in about 8 weeks to see whether his symptoms have improved and thereby give him a reason to pursue stenting for symptom relief.  He would like to go back on apixoban. We will discontinue his Rivaroxaban  We spent more than 50% of our >40 min visit in face to face counseling regarding the above

## 2014-11-07 ENCOUNTER — Ambulatory Visit: Payer: PPO | Admitting: Internal Medicine

## 2014-12-11 ENCOUNTER — Other Ambulatory Visit: Payer: Self-pay | Admitting: Internal Medicine

## 2014-12-16 ENCOUNTER — Other Ambulatory Visit: Payer: Self-pay | Admitting: Internal Medicine

## 2014-12-23 ENCOUNTER — Ambulatory Visit (INDEPENDENT_AMBULATORY_CARE_PROVIDER_SITE_OTHER): Payer: PPO | Admitting: Internal Medicine

## 2014-12-23 ENCOUNTER — Encounter: Payer: Self-pay | Admitting: Internal Medicine

## 2014-12-23 VITALS — BP 126/70 | HR 66 | Ht 69.0 in | Wt 218.0 lb

## 2014-12-23 DIAGNOSIS — R001 Bradycardia, unspecified: Secondary | ICD-10-CM

## 2014-12-23 DIAGNOSIS — I480 Paroxysmal atrial fibrillation: Secondary | ICD-10-CM

## 2014-12-23 MED ORDER — NITROGLYCERIN 0.4 MG SL SUBL: 0.4000 mg | SUBLINGUAL_TABLET | SUBLINGUAL | Status: DC | PRN

## 2014-12-23 NOTE — Patient Instructions (Signed)
Medication Instructions:  Your physician recommends that you continue on your current medications as directed. Please refer to the Current Medication list given to you today.  Labwork: None ordered  Testing/Procedures: None ordered  Follow-Up: Your physician wants you to follow-up in: 6 months with Dr. Caryl Comes. You will receive a reminder letter in the mail two months in advance. If you don't receive a letter, please call our office to schedule the follow-up appointment.   Any Other Special Instructions Will Be Listed Below (If Applicable). Thank you for choosing [facility]!!

## 2014-12-23 NOTE — Progress Notes (Signed)
Patient Care Team: Janith Lima, MD as PCP - General (Internal Medicine) Franchot Gallo, MD (Urology) [name] [name], MD (Dermatology) Monna Fam, MD as Consulting Physician (Ophthalmology) Deboraha Sprang, MD as Consulting Physician (Cardiology)   HPI  Donald [name] is a 81 y.o. male Seem in followup exercise associated chest pain He was seen November 2015 with evidence of rapid atrial fibrillation with a course flutter-like appearance.  This recurred 4/16.  April 16 he was noted to have recurrent atrial fibrillation and underwent cardioversion. He was unaware of his tachycardia. He noted no change in his functional status with the atrial fibrillation.  October 2015 Echo - Left ventricle: The cavity size was normal. Wall thickness was increased in a pattern of mild LVH. Systolic function was normal. The estimated ejection fraction was in the range of 55% to 60%.<<30% (10/15)  Aortic valve: Heavily calcirfied right coronary cusp. There was mild stenosis. There was mild regurgitation.BICUSPID mean gradient was 10.  - Mitral valve: There was mild regurgitation. - Left atrium: The atrium was mildly to moderately dilated. - Right atrium: The atrium was mildly dilated. - Atrial septum: No defect or patent foramen ovale was identified.   He is noted improved exercise tolerance. He is not having chest discomfort when he walks. He did have one episode of neck discomfort this time associated with tachypalpitations. His home monitor demonstrated a heart rate 178. He to Cardizem and it terminated.  He has had no further edema    Past Medical History  Diagnosis Date  . Hypertension   . Sinus bradycardia   . ED (erectile dysfunction)   . Arthropathy, unspecified, site unspecified   . Prostatitis, unspecified   . Personal history of other diseases of digestive system   . Allergic rhinitis due to pollen   . Benign neoplasm of colon   . Diverticulosis   . Skin cancer       basal and squamous cell  . Abnormal CT scan, stomach     a. Profound thickening of the fundus and cardia of the stomach, suspicious for potential neoplasm by CT 02/2014. Referred to GI.  Marland Kitchen Atherosclerosis     a. Noted by abdominal CT 02/2014 (h/o normal nuc 2011).  . Habitual alcohol use   . Snoring     a. Pending OSA eval by pulm.  . Hyperlipidemia   . Obesity   . Shortness of breath 12/2013  . Atrial flutter 02/24/14    s/p ablation 11/15  . GERD (gastroesophageal reflux disease)   . PAF (paroxysmal atrial fibrillation)   . Cardiomyopathy     tachy mediated - a. TEE (10/15):  EF 30%;  b. Echo after NSR restored (10/15):  mild LVH, EF 55-60%, mild AS, mild AI, mild MR, mild to mod LAE, mild RAE  . Esophageal stricture   . Hiatal hernia   . Diverticulosis     Past Surgical History  Procedure Laterality Date  . Inguinal hernia repair      right  . Inguinal hernia repair      left  . Tonsillectomy    . Orif fracture of the elbow    . Squamous cell carcinoma excision    . Skin cancer excision  05/21/12    Squamous cell ca  . Hernia repair    . Tee without cardioversion N/A 02/24/2014    Procedure: TRANSESOPHAGEAL ECHOCARDIOGRAM (TEE);  Surgeon: Lelon Perla, MD;  Location: [place];  Service: Cardiovascular;  Laterality:  N/A;  . Cardioversion N/A 02/24/2014    Procedure: CARDIOVERSION;  Surgeon: Lelon Perla, MD;  Location: [facility] ENDOSCOPY;  Service: Cardiovascular;  Laterality: N/A;  . Ablation  03/19/2014    RFCA of atrial flutter by Dr Caryl Comes  . Esophagogastroduodenoscopy N/A 04/09/2014    Procedure: ESOPHAGOGASTRODUODENOSCOPY (EGD);  Surgeon: Inda Castle, MD;  Location: [place];  Service: Endoscopy;  Laterality: N/A;  . Atrial flutter ablation N/A 03/19/2014    Procedure: ATRIAL FLUTTER ABLATION;  Surgeon: Deboraha Sprang, MD;  Location: [facility] CATH LAB;  Service: Cardiovascular;  Laterality: N/A;  . Cardioversion Right 09/10/2014    Procedure:  CARDIOVERSION;  Surgeon: Deboraha Sprang, MD;  Location: [facility] CATH LAB;  Service: Cardiovascular;  Laterality: Right;    Current Outpatient Prescriptions  Medication Sig Dispense Refill  . apixaban (ELIQUIS) 5 MG TABS tablet Take 1 tablet (5 mg total) by mouth 2 (two) times daily. 60 tablet 6  . atorvastatin (LIPITOR) 40 MG tablet Take 1 tablet (40 mg total) by mouth daily. 90 tablet 3  . diltiazem (DILACOR XR) 240 MG 24 hr capsule Take 240 mg by mouth daily as needed (increased heart rate).    . finasteride (PROSCAR) 5 MG tablet Take 1 tablet (5 mg total) by mouth daily. 30 tablet 11  . fluticasone (FLONASE) 50 MCG/ACT nasal spray Place 1 spray into both nostrils daily as needed for allergies or rhinitis. **SHAKE GENTLY**    . furosemide (LASIX) 40 MG tablet Take 40 mg by mouth daily.    Marland Kitchen loratadine (CLARITIN) 10 MG tablet Take 10 mg by mouth daily as needed for allergies.     Marland Kitchen losartan (COZAAR) 100 MG tablet Take 1 tablet (100 mg total) by mouth daily. 90 tablet 3  . Multiple Vitamins-Minerals (EYE VITAMINS PO) Take 1 tablet by mouth 2 (two) times daily. Macular Supplement    . Multiple Vitamins-Minerals (MULTIVITAMIN GUMMIES ADULT PO) Take 1 tablet by mouth 2 (two) times daily.    . nitroGLYCERIN (NITROSTAT) 0.4 MG SL tablet Place 1 tablet (0.4 mg total) under the tongue every 5 (five) minutes as needed for chest pain. 25 tablet 0  . pantoprazole (PROTONIX) 20 MG tablet Take 20 mg by mouth daily.     No current facility-administered medications for this visit.    No Known Allergies  Review of Systems negative except from HPI and PMH  Physical Exam BP 126/70 mmHg  Pulse 66  Ht 5\' 9"  (1.753 m)  Wt 218 lb (98.884 kg)  BMI 32.18 kg/m2 Well developed and well nourished in no acute distress HENT normal E scleral and icterus clear Neck Supple JVP 7-8 cm carotids brisk and full Clear to ausculation RRR without murmurs Soft with active bowel sounds No clubbing cyanosis  noEdema Alert  and oriented, grossly normal motor and sensory function Skin Warm and Dry   ECG demonstrates sinus rhythm at 66 Intervals 14/09/39 Axis is 10  Assessment and  Plan  Atrial fibrillation/flutter-paroxysmal  Tachypalpitations  Cardiomyopathy-resolved  Hypertension  Exercise intolerance  Hyperlipidemia  Exertional chest discomfort consistent with angina   HFpEF    He is much improved. We will continue using when necessary nitroglycerin.  Exercise tolerance is improved I suspect is related to port to the resolution of his bradycardia. With the discontinuation of his beta blocker, he is using Cardizem when necessary.  The event that he continues to have paroxysms of tachycardia we may l need to utilize an event recorder to try to identify  mechanisms although this possibly representing nonsustained atrial tachycardia

## 2015-01-12 ENCOUNTER — Other Ambulatory Visit: Payer: Self-pay | Admitting: Internal Medicine

## 2015-01-20 ENCOUNTER — Other Ambulatory Visit (INDEPENDENT_AMBULATORY_CARE_PROVIDER_SITE_OTHER): Payer: PPO

## 2015-01-20 ENCOUNTER — Encounter: Payer: Self-pay | Admitting: Internal Medicine

## 2015-01-20 ENCOUNTER — Ambulatory Visit (INDEPENDENT_AMBULATORY_CARE_PROVIDER_SITE_OTHER): Payer: PPO | Admitting: Internal Medicine

## 2015-01-20 VITALS — BP 148/68 | HR 65 | Temp 98.0°F | Resp 16 | Ht 69.0 in | Wt 218.0 lb

## 2015-01-20 DIAGNOSIS — E785 Hyperlipidemia, unspecified: Secondary | ICD-10-CM

## 2015-01-20 DIAGNOSIS — H9190 Unspecified hearing loss, unspecified ear: Secondary | ICD-10-CM | POA: Insufficient documentation

## 2015-01-20 DIAGNOSIS — I10 Essential (primary) hypertension: Secondary | ICD-10-CM

## 2015-01-20 DIAGNOSIS — Z23 Encounter for immunization: Secondary | ICD-10-CM | POA: Diagnosis not present

## 2015-01-20 DIAGNOSIS — H9193 Unspecified hearing loss, bilateral: Secondary | ICD-10-CM

## 2015-01-20 LAB — COMPREHENSIVE METABOLIC PANEL
ALBUMIN: 4.3 g/dL (ref 3.5–5.2)
ALT: 17 U/L (ref 0–53)
AST: 20 U/L (ref 0–37)
Alkaline Phosphatase: 87 U/L (ref 39–117)
BUN: 26 mg/dL — ABNORMAL HIGH (ref 6–23)
CHLORIDE: 103 meq/L (ref 96–112)
CO2: 29 meq/L (ref 19–32)
Calcium: 9.8 mg/dL (ref 8.4–10.5)
Creatinine, Ser: 1.3 mg/dL (ref 0.40–1.50)
GFR: 56.45 mL/min — AB (ref >60.00)
Glucose, Bld: 101 mg/dL — ABNORMAL HIGH (ref 70–99)
Potassium: 4.9 mEq/L (ref 3.5–5.1)
SODIUM: 140 meq/L (ref 135–145)
TOTAL PROTEIN: 7.3 g/dL (ref 6.0–8.3)
Total Bilirubin: 0.6 mg/dL (ref 0.2–1.2)

## 2015-01-20 LAB — LIPID PANEL
Cholesterol: 121 mg/dL (ref 0–200)
HDL: 51.1 mg/dL (ref >39.00)
LDL Cholesterol: 53 mg/dL (ref 0–99)
NONHDL: 69.45
Total CHOL/HDL Ratio: 2
Triglycerides: 84 mg/dL (ref 0.0–149.0)
VLDL: 16.8 mg/dL (ref 0.0–40.0)

## 2015-01-20 LAB — CBC WITH DIFFERENTIAL/PLATELET
BASOS PCT: 0.3 % (ref 0.0–3.0)
Basophils Absolute: 0 10*3/uL (ref 0.0–0.1)
EOS PCT: 1.7 % (ref 0.0–5.0)
Eosinophils Absolute: 0.1 10*3/uL (ref 0.0–0.7)
HEMATOCRIT: 38.4 % — AB (ref 39.0–52.0)
Hemoglobin: 13 g/dL (ref 13.0–17.0)
LYMPHS PCT: 27 % (ref 12.0–46.0)
Lymphs Abs: 2.1 10*3/uL (ref 0.7–4.0)
MCHC: 33.9 g/dL (ref 30.0–36.0)
MCV: 96.9 fl (ref 78.0–100.0)
MONOS PCT: 9.8 % (ref 3.0–12.0)
Monocytes Absolute: 0.8 10*3/uL (ref 0.1–1.0)
NEUTROS ABS: 4.9 10*3/uL (ref 1.4–7.7)
Neutrophils Relative %: 61.2 % (ref 43.0–77.0)
PLATELETS: 193 10*3/uL (ref 150.0–400.0)
RBC: 3.96 Mil/uL — ABNORMAL LOW (ref 4.22–5.81)
RDW: 13.4 % (ref 11.5–15.5)
WBC: 7.9 10*3/uL (ref 4.0–10.5)

## 2015-01-20 LAB — TSH: TSH: 0.57 u[IU]/mL (ref 0.35–4.50)

## 2015-01-20 NOTE — Patient Instructions (Signed)

## 2015-01-20 NOTE — Progress Notes (Signed)
Pre visit review using our clinic review tool, if applicable. No additional management support is needed unless otherwise documented below in the visit note. 

## 2015-01-21 ENCOUNTER — Encounter: Payer: Self-pay | Admitting: Internal Medicine

## 2015-01-21 NOTE — Progress Notes (Signed)
Subjective:  Patient ID: Donald [name], male    DOB: [birth year]  Age: 81 y.o. MRN: [id number]  CC: Hypertension and Hyperlipidemia   HPI Donald [name] presents for follow-up on hypertension and hypercholesterolemia but he also complains of a decreased level of hearing. He has noticed his memory failing over the last few years as well but he is not willing to see a neurologist about this. He offers no other complaints.  Outpatient Prescriptions Prior to Visit  Medication Sig Dispense Refill  . apixaban (ELIQUIS) 5 MG TABS tablet Take 1 tablet (5 mg total) by mouth 2 (two) times daily. 60 tablet 6  . atorvastatin (LIPITOR) 40 MG tablet Take 1 tablet (40 mg total) by mouth daily. 90 tablet 3  . diltiazem (DILACOR XR) 240 MG 24 hr capsule Take 240 mg by mouth daily as needed (increased heart rate).    . fluticasone (FLONASE) 50 MCG/ACT nasal spray Place 1 spray into both nostrils daily as needed for allergies or rhinitis. **SHAKE GENTLY**    . furosemide (LASIX) 40 MG tablet Take 40 mg by mouth daily.    Marland Kitchen loratadine (CLARITIN) 10 MG tablet Take 10 mg by mouth daily as needed for allergies.     Marland Kitchen losartan (COZAAR) 100 MG tablet Take 1 tablet (100 mg total) by mouth daily. 90 tablet 3  . Multiple Vitamins-Minerals (EYE VITAMINS PO) Take 1 tablet by mouth 2 (two) times daily. Macular Supplement    . Multiple Vitamins-Minerals (MULTIVITAMIN GUMMIES ADULT PO) Take 1 tablet by mouth 2 (two) times daily.    . nitroGLYCERIN (NITROSTAT) 0.4 MG SL tablet Place 1 tablet (0.4 mg total) under the tongue every 5 (five) minutes as needed for chest pain. 25 tablet 1  . pantoprazole (PROTONIX) 20 MG tablet Take 20 mg by mouth daily.    . finasteride (PROSCAR) 5 MG tablet TAKE 1 TABLET ONCE DAILY. 60 tablet 0   No facility-administered medications prior to visit.    ROS Review of Systems  Constitutional: Negative.  Negative for fever, chills, diaphoresis, activity change, appetite change, fatigue and  unexpected weight change.  HENT: Positive for hearing loss. Negative for congestion, ear pain, facial swelling, postnasal drip, rhinorrhea, sinus pressure, sneezing, sore throat, tinnitus and voice change.   Eyes: Negative.   Respiratory: Negative.  Negative for apnea, cough, choking, chest tightness, shortness of breath and stridor.   Cardiovascular: Negative.  Negative for chest pain, palpitations and leg swelling.  Gastrointestinal: Negative.  Negative for nausea, vomiting, abdominal pain, diarrhea and blood in stool.  Endocrine: Negative.   Genitourinary: Negative.   Musculoskeletal: Negative.   Skin: Negative.  Negative for rash.  Allergic/Immunologic: Negative.   Neurological: Negative.   Hematological: Negative.  Negative for adenopathy. Does not bruise/bleed easily.  Psychiatric/Behavioral: Positive for decreased concentration. Negative for suicidal ideas, confusion, sleep disturbance and agitation. The patient is not nervous/anxious.     Objective:  BP 148/68 mmHg  Pulse 65  Temp(Src) 98 F (36.7 C) (Oral)  Resp 16  Ht 5\' 9"  (1.753 m)  Wt 218 lb (98.884 kg)  BMI 32.18 kg/m2  SpO2 97%  BP Readings from Last 3 Encounters:  01/20/15 148/68  12/23/14 126/70  11/04/14 112/66    Wt Readings from Last 3 Encounters:  01/20/15 218 lb (98.884 kg)  12/23/14 218 lb (98.884 kg)  11/04/14 219 lb 6.4 oz (99.519 kg)    Physical Exam  Constitutional: He is oriented to person, place, and time. No distress.  HENT:  Right Ear: Hearing, tympanic membrane, external ear and ear canal normal.  Left Ear: Hearing, tympanic membrane, external ear and ear canal normal.  Mouth/Throat: Oropharynx is clear and moist. No oropharyngeal exudate.  Eyes: Conjunctivae are normal. Right eye exhibits no discharge. Left eye exhibits no discharge. No scleral icterus.  Neck: Normal range of motion. Neck supple. No JVD present. No tracheal deviation present. No thyromegaly present.  Cardiovascular:  Normal rate, regular rhythm, normal heart sounds and intact distal pulses.  Exam reveals no gallop and no friction rub.   No murmur heard. Pulmonary/Chest: Effort normal and breath sounds normal. No stridor. No respiratory distress. He has no wheezes. He has no rales. He exhibits no tenderness.  Abdominal: Soft. Bowel sounds are normal. He exhibits no distension and no mass. There is no tenderness. There is no rebound and no guarding.  Musculoskeletal: Normal range of motion. He exhibits no edema or tenderness.  Lymphadenopathy:    He has no cervical adenopathy.  Neurological: He is oriented to person, place, and time.  Skin: Skin is warm and dry. No rash noted. He is not diaphoretic. No erythema. No pallor.  Psychiatric: He has a normal mood and affect. His behavior is normal. Judgment and thought content normal.  Vitals reviewed.   Lab Results  Component Value Date   WBC 7.9 01/20/2015   HGB 13.0 01/20/2015   HCT 38.4* 01/20/2015   PLT 193.0 01/20/2015   GLUCOSE 101* 01/20/2015   CHOL 121 01/20/2015   TRIG 84.0 01/20/2015   HDL 51.10 01/20/2015   LDLDIRECT 138.8 04/18/2011   LDLCALC 53 01/20/2015   ALT 17 01/20/2015   AST 20 01/20/2015   NA 140 01/20/2015   K 4.9 01/20/2015   CL 103 01/20/2015   CREATININE 1.30 01/20/2015   BUN 26* 01/20/2015   CO2 29 01/20/2015   TSH 0.57 01/20/2015   PSA 0.83 11/20/2013   INR 1.19 04/08/2014    No results found.  Assessment & Plan:   Lora was seen today for hypertension and hyperlipidemia.  Diagnoses and all orders for this visit:  Hearing loss, bilateral- I have asked him to have his hearing checked, if he decides he wants to be treated for memory loss so will refer to neurology at his request. -     Ambulatory referral to Audiology  Need for influenza vaccination -     Flu vaccine HIGH DOSE PF  Essential hypertension- his blood pressure is well controlled, his BUN is slightly elevated consistent with dehydration and that  his creatinine is slightly elevated. I have asked him to be more liberal with his fluid intake. -     Comprehensive metabolic panel; Future -     CBC with Differential/Platelet; Future  Hyperlipidemia with target LDL less than 100- he is achieved his LDL goal is doing well on the statin. -     Comprehensive metabolic panel; Future -     Lipid panel; Future -     TSH; Future   I have discontinued Mr. Hakim finasteride. I am also having him maintain his loratadine, atorvastatin, Multiple Vitamins-Minerals (EYE VITAMINS PO), Multiple Vitamins-Minerals (MULTIVITAMIN GUMMIES ADULT PO), losartan, apixaban, furosemide, pantoprazole, fluticasone, diltiazem, and nitroGLYCERIN.  No orders of the defined types were placed in this encounter.     Follow-up: Return in about 6 months (around 07/20/2015).  [name], MD

## 2015-01-29 ENCOUNTER — Other Ambulatory Visit: Payer: Self-pay | Admitting: Internal Medicine

## 2015-01-29 ENCOUNTER — Encounter: Payer: Self-pay | Admitting: Internal Medicine

## 2015-01-29 NOTE — Telephone Encounter (Signed)
Dr Hilarie Fredrickson- This patient requests refills on furosemide. It appears that we originally increased him from 20 mg furosemide to 40 mg furosemide back in 05/2014 (had lower extremity edema). Per your note, Dr Caryl Comes would be advised that we did that. We refilled it again in April and it looks like it has also been refilled a couple of times since then. Is this a medication you want to continue prescribing or should he get from cardiology or PCP?

## 2015-01-29 NOTE — Telephone Encounter (Signed)
Can refill x 1, but please let pt know this needs to come from Dr. Ronnald Ramp or his cardiologist in the future Thanks JMP

## 2015-02-06 ENCOUNTER — Encounter: Payer: Self-pay | Admitting: Internal Medicine

## 2015-02-26 ENCOUNTER — Other Ambulatory Visit: Payer: Self-pay | Admitting: Internal Medicine

## 2015-03-11 ENCOUNTER — Encounter: Payer: Self-pay | Admitting: Pulmonary Disease

## 2015-03-11 ENCOUNTER — Ambulatory Visit (INDEPENDENT_AMBULATORY_CARE_PROVIDER_SITE_OTHER): Payer: PPO | Admitting: Pulmonary Disease

## 2015-03-11 VITALS — BP 118/72 | HR 62 | Ht 69.0 in | Wt 222.9 lb

## 2015-03-11 DIAGNOSIS — G4733 Obstructive sleep apnea (adult) (pediatric): Secondary | ICD-10-CM | POA: Diagnosis not present

## 2015-03-11 DIAGNOSIS — J301 Allergic rhinitis due to pollen: Secondary | ICD-10-CM | POA: Diagnosis not present

## 2015-03-11 NOTE — Progress Notes (Signed)
   Subjective:    Patient ID: Donald [name], male    DOB: 11/04/1932, 81 y.o.   MRN: [id number]  HPI   81 year old ex-smoker presents for FU of OSA. He used dental appliances without any benefit before getting on CPAP   Smoked a pack per day until he quit in 2000, about 40 pack years. He drinks a cocktail before dinner, and 1-2 glasses of wine after.  03/11/2015  Chief Complaint  Patient presents with  . Follow-up    Pt c/o of some mild nasal congestion and slight cough in the mornings. Pt denies SOB/wheeze/CP/tightness. Pt states that he uses his CPAP nightly and his sleeping has improved.    Sleeping well, able to use >8h, FF mask ok, pr ok, no nasal stuffiness or dry mouth Wakes up rested, occ naps  36m download 05/2014 >> excellent usage, AHI 7/h on 13 cm 08/2014  AHI 7.7 (similar to last download) Download 02/2015 >> AHI 4/h, usage > 8h, no leak  Patient has atrial fibrillation. He is followed by cardiology and is on diltiazem as needed only  Significant tests/ events  Spirometry -  FEV1 ratio of 75, with FEV1 of 77% and FVC of 77% suggesting mild restriction CT chest in 04/2013 showed mild emphysema PSG 04/2014 - 206 lbs-Moderate OSA-stopped breathing 24 times an hour.- corrected by C Pap 13 cm, large full facemask Review of Systems neg for any significant sore throat, dysphagia, itching, sneezing, nasal congestion or excess/ purulent secretions, fever, chills, sweats, unintended wt loss, pleuritic or exertional cp, hempoptysis, orthopnea pnd or change in chronic leg swelling. Also denies presyncope, palpitations, heartburn, abdominal pain, nausea, vomiting, diarrhea or change in bowel or urinary habits, dysuria,hematuria, rash, arthralgias, visual complaints, headache, numbness weakness or ataxia.     Objective:   Physical Exam  Gen. Pleasant, well-nourished, in no distress ENT - no lesions, no post nasal drip Neck: No JVD, no thyromegaly, no carotid bruits Lungs: no  use of accessory muscles, no dullness to percussion, clear without rales or rhonchi  Cardiovascular: Rhythm regular, heart sounds  normal, no murmurs or gallops, no peripheral edema Musculoskeletal: No deformities, no cyanosis or clubbing        Assessment & Plan:

## 2015-03-11 NOTE — Patient Instructions (Signed)
CPAP supplies will be renewed x 1 year 

## 2015-03-11 NOTE — Addendum Note (Signed)
Addended by: Mathis Dad on: 03/11/2015 10:37 AM   Modules accepted: Orders

## 2015-03-11 NOTE — Assessment & Plan Note (Signed)
Ct flonase 

## 2015-03-11 NOTE — Assessment & Plan Note (Signed)
Renew supplies x 1 year  Weight loss encouraged, compliance with goal of at least 4-6 hrs every night is the expectation. Advised against medications with sedative side effects Cautioned against driving when sleepy - understanding that sleepiness will vary on a day to day basis

## 2015-03-26 ENCOUNTER — Encounter: Payer: Self-pay | Admitting: Pulmonary Disease

## 2015-04-04 ENCOUNTER — Encounter: Payer: Self-pay | Admitting: Pulmonary Disease

## 2015-04-06 NOTE — Telephone Encounter (Signed)
Below is an email sent by pt: -------------------------------------------- I love my full face mask CPAP, but wonder if I could get the same results using a system which delivers air just to the nostrils. Again - no problem wearing the mask per se, but after 2 - 3 weeks the joint between the rigid mask frame and the elbow which attaches to the tube starts leaking air to the extent that it makes enough noise to keep me , and my wife, awake. I suppose I could keep ordering new parts every month but it is not in my nature to do so.  So I'm asking: Is there another way to provide the pressure needed while keeping everything quiet enough to sleep? Would I have to invest in a completely new system? I certainly don't want to go back to the way things used to be, but it would be nice if I could count on quiet every night. Should I consider going back to the supplier of my equipment, [employer] and making sure I am doing everything correctly? Advice appreciated.  -------------------------------------------  RA please advise on recs.  Thanks!

## 2015-04-13 ENCOUNTER — Ambulatory Visit (INDEPENDENT_AMBULATORY_CARE_PROVIDER_SITE_OTHER): Payer: PPO | Admitting: Internal Medicine

## 2015-04-13 ENCOUNTER — Encounter: Payer: Self-pay | Admitting: Internal Medicine

## 2015-04-13 VITALS — BP 130/60 | HR 82 | Temp 98.1°F | Resp 12 | Ht 69.0 in | Wt 225.0 lb

## 2015-04-13 DIAGNOSIS — J301 Allergic rhinitis due to pollen: Secondary | ICD-10-CM | POA: Diagnosis not present

## 2015-04-13 NOTE — Progress Notes (Signed)
Pre visit review using our clinic review tool, if applicable. No additional management support is needed unless otherwise documented below in the visit note. 

## 2015-04-13 NOTE — Patient Instructions (Signed)
We would like you to start taking the flonase and the claritin for the cold. If you are not feeling better by Friday or Monday call us and we will send in an antibiotic.   You can also try hot liquids with honey to help coat the throat to soothe it.  Upper Respiratory Infection, Adult Most upper respiratory infections (URIs) are a viral infection of the air passages leading to the lungs. A URI affects the nose, throat, and upper air passages. The most common type of URI is nasopharyngitis and is typically referred to as "the common cold." URIs run their course and usually go away on their own. Most of the time, a URI does not require medical attention, but sometimes a bacterial infection in the upper airways can follow a viral infection. This is called a secondary infection. Sinus and middle ear infections are common types of secondary upper respiratory infections. Bacterial pneumonia can also complicate a URI. A URI can worsen asthma and chronic obstructive pulmonary disease (COPD). Sometimes, these complications can require emergency medical care and may be life threatening.  CAUSES Almost all URIs are caused by viruses. A virus is a type of germ and can spread from one person to another.  RISKS FACTORS You may be at risk for a URI if:   You smoke.   You have chronic heart or lung disease.  You have a weakened defense (immune) system.   You are very young or very old.   You have nasal allergies or asthma.  You work in crowded or poorly ventilated areas.  You work in health care facilities or schools. SIGNS AND SYMPTOMS  Symptoms typically develop 2-3 days after you come in contact with a cold virus. Most viral URIs last 7-10 days. However, viral URIs from the influenza virus (flu virus) can last 14-18 days and are typically more severe. Symptoms may include:   Runny or stuffy (congested) nose.   Sneezing.   Cough.   Sore throat.   Headache.   Fatigue.   Fever.    Loss of appetite.   Pain in your forehead, behind your eyes, and over your cheekbones (sinus pain).  Muscle aches.  DIAGNOSIS  Your health care provider may diagnose a URI by:  Physical exam.  Tests to check that your symptoms are not due to another condition such as:  Strep throat.  Sinusitis.  Pneumonia.  Asthma. TREATMENT  A URI goes away on its own with time. It cannot be cured with medicines, but medicines may be prescribed or recommended to relieve symptoms. Medicines may help:  Reduce your fever.  Reduce your cough.  Relieve nasal congestion. HOME CARE INSTRUCTIONS   Take medicines only as directed by your health care provider.   Gargle warm saltwater or take cough drops to comfort your throat as directed by your health care provider.  Use a warm mist humidifier or inhale steam from a shower to increase air moisture. This may make it easier to breathe.  Drink enough fluid to keep your urine clear or pale yellow.   Eat soups and other clear broths and maintain good nutrition.   Rest as needed.   Return to work when your temperature has returned to normal or as your health care provider advises. You may need to stay home longer to avoid infecting others. You can also use a face mask and careful hand washing to prevent spread of the virus.  Increase the usage of your inhaler if you have asthma.  Do not use any tobacco products, including cigarettes, chewing tobacco, or electronic cigarettes. If you need help quitting, ask your health care provider. PREVENTION  The best way to protect yourself from getting a cold is to practice good hygiene.   Avoid oral or hand contact with people with cold symptoms.   Wash your hands often if contact occurs.  There is no clear evidence that vitamin C, vitamin E, echinacea, or exercise reduces the chance of developing a cold. However, it is always recommended to get plenty of rest, exercise, and practice good  nutrition.  SEEK MEDICAL CARE IF:   You are getting worse rather than better.   Your symptoms are not controlled by medicine.   You have chills.  You have worsening shortness of breath.  You have brown or red mucus.  You have yellow or brown nasal discharge.  You have pain in your face, especially when you bend forward.  You have a fever.  You have swollen neck glands.  You have pain while swallowing.  You have white areas in the back of your throat. SEEK IMMEDIATE MEDICAL CARE IF:   You have severe or persistent:  Headache.  Ear pain.  Sinus pain.  Chest pain.  You have chronic lung disease and any of the following:  Wheezing.  Prolonged cough.  Coughing up blood.  A change in your usual mucus.  You have a stiff neck.  You have changes in your:  Vision.  Hearing.  Thinking.  Mood. MAKE SURE YOU:   Understand these instructions.  Will watch your condition.  Will get help right away if you are not doing well or get worse.   This information is not intended to replace advice given to you by your health care provider. Make sure you discuss any questions you have with your health care provider.   Document Released: 10/26/2000 Document Revised: 09/16/2014 Document Reviewed: 08/07/2013 Elsevier Interactive Patient Education [employer].

## 2015-04-14 NOTE — Assessment & Plan Note (Signed)
Advised him to start using flonase and zyrtec for his symptoms. No indication for antibiotics today. Advised on OTC remedies as well. Call back if not improving.

## 2015-04-14 NOTE — Progress Notes (Signed)
   Subjective:    Patient ID: Donald [name], male    DOB: 08-17-1932, 81 y.o.   MRN: [id number]  HPI The patient is an 81 YO man coming in for 3 days of nasal congestion and drainage. He has not been taking his zyrtec or flonase but he has both at home. Denies fevers. Minor chills 2 days ago. No nausea or aches. No myalgia. No hearing change or ear pain. No cough or sore throat. Overall stable. Has not tried anything for it.   Review of Systems  Constitutional: Positive for chills. Negative for fever, activity change, appetite change and fatigue.  HENT: Positive for congestion, postnasal drip and rhinorrhea. Negative for ear discharge, ear pain, sinus pressure, sore throat and trouble swallowing.   Eyes: Negative.   Respiratory: Negative for cough, chest tightness, shortness of breath and wheezing.   Cardiovascular: Negative for chest pain, palpitations and leg swelling.  Gastrointestinal: Negative.   Musculoskeletal: Negative.   Neurological: Negative.       Objective:   Physical Exam  Constitutional: He is oriented to person, place, and time. He appears well-developed and well-nourished.  HENT:  Head: Normocephalic and atraumatic.  Oropharynx with erythema and clear drainage, nasal turbinates with swelling and redness.   Eyes: EOM are normal.  Neck: Normal range of motion.  Cardiovascular: Normal rate and regular rhythm.   Pulmonary/Chest: Effort normal and breath sounds normal. No respiratory distress. He has no wheezes. He has no rales.  Abdominal: Soft. He exhibits no distension. There is no tenderness. There is no rebound.  Neurological: He is alert and oriented to person, place, and time.  Skin: Skin is warm and dry.   Filed Vitals:   04/13/15 1424  BP: 130/60  Pulse: 82  Temp: 98.1 F (36.7 C)  TempSrc: Oral  Resp: 12  Height: 5\' 9"  (1.753 m)  Weight: 225 lb (102.059 kg)  SpO2: 98%      Assessment & Plan:

## 2015-04-15 ENCOUNTER — Encounter: Payer: Self-pay | Admitting: Internal Medicine

## 2015-04-20 ENCOUNTER — Telehealth: Payer: Self-pay | Admitting: Internal Medicine

## 2015-04-20 NOTE — Telephone Encounter (Signed)
New problem    Pt said he can't walk that far because his heart condition won't allow him to. Pt wish to speak to nurse.

## 2015-04-20 NOTE — Telephone Encounter (Signed)
Returned patient phone call Complains of 2-3 weeks of no energy and tires easily with activity Walks upstairs across yard and then needs to take a rest. Complains of leg cramps during the night Takes Lasix 40 mg daily No potasium supplement Instructed patient to increase dietary intake of potasium eating foods such as bananas.  Would you like patient to come for an OV?  Routed to Dr. Caryl Comes, [place] and Dr. [name] his PCP

## 2015-04-20 NOTE — Telephone Encounter (Signed)
Discussed with Tommas Olp NP-C Called patient with Flex appointment for 0900 12/6 Tuesday with Nicki Reaper PA Patient Repeated appointment information correctly

## 2015-04-20 NOTE — Progress Notes (Signed)
Cardiology Office Note   Date:  04/21/2015   ID:  Donald [name], DOB 03-May-1933, MRN [id number]   Patient Care Team: Janith Lima, MD as PCP - General (Internal Medicine) Franchot Gallo, MD (Urology) [name] [name], MD (Dermatology) Monna Fam, MD as Consulting Physician (Ophthalmology) Deboraha Sprang, MD as Consulting Physician (Cardiology)    Chief Complaint  Patient presents with  . Fatigue     History of Present Illness: Donald [name] is a 81 y.o. male with a hx of paroxysmal atrial flutter with associated tachycardia mediated cardiomyopathy, functionally bicuspid aortic valve, diastolic HF, HTN, HL, ETOH abuse, skin CA. Patient underwent RFCA 03/2014 with Dr. Caryl Comes. LV function improved in normal sinus rhythm. He had recurrent atrial fibrillation with RVR in 11/15 during an admission for EGD to evaluate possible gastric neoplasm.  Biopsy demonstrated chronic active gastritis only.  He again had recurrence of atrial flutter in 4/16 with associated volume excess. He underwent DCCV. He has also had issues with sinus bradycardia in the past. Earlier this year, he complained of chest pain to Dr. Caryl Comes. Myoview was intermediate risk with mild ischemia in the LCx/OM territory. In June, it was decided to pursue when necessary nitroglycerin prior to exercise and avoid invasive workup at that time. Last seen by Dr. Caryl Comes 12/23/14. He noted improved exercise tolerance. He was no longer having chest discomfort. Heart rate was improved with discontinuation of beta blocker therapy.  He presents today with complaints of decreased exercise tolerance and chest discomfort with associated dyspnea with minimal activity.  He has taken NTG on a few occasions.  He denies rest symptoms.  He notes radiation to his neck.  Denies syncope, orthopnea.  Sleeps with CPAP.  He has mild edema.  Denies weight changes.    Studies/Reports Reviewed Today:  Myoview 10/23/14 This is an intermediate risk study with  a small area, mild severity reversible defect in the LCX/OM territory (SDS 3). EF 63%  Holter 12/15 Sinus bradycardia, sinus rhythm, atrial tach; no atrial fibrillation  Echo 03/10/14 Mild LVH, EF 55-60%, mild AS, mild AI, mild MR, mild to moderate LAE, mild RAE   TEE 02/23/14 EF 30%, diffuse HK, functionally bicuspid aortic valve with fusion of the right-left coronary can measure, mild AI, moderate MR, moderate LAE , mildly reduced RVSF, moderate RAE   Past Medical History  Diagnosis Date  . Hypertension   . Sinus bradycardia   . ED (erectile dysfunction)   . Arthropathy, unspecified, site unspecified   . Prostatitis, unspecified   . Personal history of other diseases of digestive system   . Allergic rhinitis due to pollen   . Benign neoplasm of colon   . Diverticulosis   . Skin cancer     basal and squamous cell  . Abnormal CT scan, stomach     a. Profound thickening of the fundus and cardia of the stomach, suspicious for potential neoplasm by CT 02/2014. Referred to GI.  Marland Kitchen Atherosclerosis     a. Noted by abdominal CT 02/2014 (h/o normal nuc 2011).  . Habitual alcohol use   . Snoring     a. Pending OSA eval by pulm.  . Hyperlipidemia   . Obesity   . Shortness of breath 12/2013  . Atrial flutter ([place]) 02/24/14    s/p ablation 11/15  . GERD (gastroesophageal reflux disease)   . PAF (paroxysmal atrial fibrillation) ([place])   . Cardiomyopathy ([place])     tachy mediated - a. TEE (10/15):  EF 30%;  b. Echo after NSR restored (10/15):  mild LVH, EF 55-60%, mild AS, mild AI, mild MR, mild to mod LAE, mild RAE  . Esophageal stricture   . Hiatal hernia   . Diverticulosis     Past Surgical History  Procedure Laterality Date  . Inguinal hernia repair      right  . Inguinal hernia repair      left  . Tonsillectomy    . Orif fracture of the elbow    . Squamous cell carcinoma excision    . Skin cancer excision  05/21/12    Squamous cell ca  . Hernia repair    . Tee without  cardioversion N/A 02/24/2014    Procedure: TRANSESOPHAGEAL ECHOCARDIOGRAM (TEE);  Surgeon: Lelon Perla, MD;  Location: [facility] ENDOSCOPY;  Service: Cardiovascular;  Laterality: N/A;  . Cardioversion N/A 02/24/2014    Procedure: CARDIOVERSION;  Surgeon: Lelon Perla, MD;  Location: [facility] ENDOSCOPY;  Service: Cardiovascular;  Laterality: N/A;  . Ablation  03/19/2014    RFCA of atrial flutter by Dr Caryl Comes  . Esophagogastroduodenoscopy N/A 04/09/2014    Procedure: ESOPHAGOGASTRODUODENOSCOPY (EGD);  Surgeon: Inda Castle, MD;  Location: [place];  Service: Endoscopy;  Laterality: N/A;  . Atrial flutter ablation N/A 03/19/2014    Procedure: ATRIAL FLUTTER ABLATION;  Surgeon: Deboraha Sprang, MD;  Location: [facility] CATH LAB;  Service: Cardiovascular;  Laterality: N/A;  . Cardioversion Right 09/10/2014    Procedure: CARDIOVERSION;  Surgeon: Deboraha Sprang, MD;  Location[facility] C[facility] LAB;  Service: Cardiovascular;  Laterality: Right;     Current Outpatient Prescriptions  Medication Sig Dispense Refill  . apixaban (ELIQUIS) 5 MG TABS tablet Take 1 tablet (5 mg total) by mouth 2 (two) times daily. 60 tablet 6  . atorvastatin (LIPITOR) 40 MG tablet TAKE 1 TABLET ONCE DAILY. 90 tablet 3  . diltiazem (DILACOR XR) 240 MG 24 hr capsule Take 240 mg by mouth daily as needed (increased heart rate).    . fluticasone (FLONASE) 50 MCG/ACT nasal spray Place 1 spray into both nostrils daily as needed for allergies or rhinitis. **SHAKE GENTLY**    . furosemide (LASIX) 40 MG tablet TAKE 1 TABLET ONCE DAILY. 30 tablet 5  . loratadine (CLARITIN) 10 MG tablet Take 10 mg by mouth daily as needed for allergies.     Marland Kitchen losartan (COZAAR) 100 MG tablet Take 1 tablet (100 mg total) by mouth daily. 90 tablet 3  . Multiple Vitamins-Minerals (EYE VITAMINS PO) Take 1 tablet by mouth 2 (two) times daily. Macular Supplement    . Multiple Vitamins-Minerals (MULTIVITAMIN GUMMIES ADULT PO) Take 1 tablet by mouth 2 (two) times daily.    .  nitroGLYCERIN (NITROSTAT) 0.4 MG SL tablet Place 1 tablet (0.4 mg total) under the tongue every 5 (five) minutes as needed for chest pain. 25 tablet 1  . pantoprazole (PROTONIX) 20 MG tablet Take 20 mg by mouth daily.     No current facility-administered medications for this visit.    Allergies:   Review of patient's allergies indicates no known allergies.    Social History:   Social History   Social History  . Marital Status: Married    Spouse Name: N/A  . Number of Children: 3  . Years of Education: 16   Occupational History  . Land    Social History Main Topics  . Smoking status: Former Smoker -- 1.00 packs/day for 40 years    Types: Cigarettes, Cigars  Quit date: 09/18/2008  . Smokeless tobacco: Current User    Types: Chew     Comment: Has Occasional Cigar/ quit   . Alcohol Use: 25.2 oz/week    21 Glasses of wine, 21 Standard drinks or equivalent per week     Comment: 1 cocktail and 2-3 glasses of wine per night  . Drug Use: No  . Sexual Activity: Not Currently   Other Topics Concern  . 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 married 1961. 2 sons- '64, '63 , I daughter- '66, 4 grandchildren. work: Museum/gallery curator, retired but still consults. 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      No new stressors/      Family History:   Family History  Problem Relation Age of Onset  . Cancer Mother     ovarian  . Other Father     renal disease- grief over loss of spouse  . Cancer Brother     prostate- died of hematologic disorder 2nd to chemo  . Diabetes Neg Hx   . Coronary artery disease Neg Hx   . Colon cancer Neg Hx   . Stomach cancer Neg Hx   . Rectal cancer Neg Hx   . Pulmonary fibrosis Brother       ROS:   Please see the history of present illness.   Review of Systems  Cardiovascular: Positive for chest pain and dyspnea on  exertion.  Musculoskeletal: Positive for joint pain.  All other systems reviewed and are negative.     PHYSICAL EXAM: VS:  BP 110/60 mmHg  Pulse 72  Ht 5\' 9"  (1.753 m)  Wt 222 lb 9.6 oz (100.971 kg)  BMI 32.86 kg/m2    Wt Readings from Last 3 Encounters:  04/21/15 222 lb 9.6 oz (100.971 kg)  04/13/15 225 lb (102.059 kg)  03/11/15 222 lb 13.9 oz (101.093 kg)     GEN: Well nourished, well developed, in no acute distress HEENT: normal Neck: no JVD, no masses Cardiac:  Normal S1/S2, RRR; no murmur ,  no rubs or gallops, trace bilateral LE edema   Respiratory:  clear to auscultation bilaterally, no wheezing, rhonchi or rales. GI: soft, nontender, nondistended, + BS MS: no deformity or atrophy Skin: warm and dry  Neuro:  CNs II-XII intact, Strength and sensation are intact Psych: Normal affect   EKG:  EKG is ordered today.  It demonstrates:   NSR, HR 72, normal axis, no ST changes, QTc 427 ms   Recent Labs: 01/20/2015: ALT 17; TSH 0.57 04/21/2015: BUN 26*; Creat 1.13*; Hemoglobin 7.0*; Platelets 330; Potassium 4.5; Sodium 136    Lipid Panel    Component Value Date/Time   CHOL 121 01/20/2015 1621   TRIG 84.0 01/20/2015 1621   HDL 51.10 01/20/2015 1621   CHOLHDL 2 01/20/2015 1621   VLDL 16.8 01/20/2015 1621   LDLCALC 53 01/20/2015 1621   LDLDIRECT 138.8 04/18/2011 1020      ASSESSMENT AND PLAN:  1. Chest Pain:  Patient presents with exertional CP and decreased exercise tolerance c/w CCS Class 3 angina.  Myoview in 6/16 was intermediate risk with evidence of ischemia in the LCx/OM territory.  He was tolerating medical Rx up until recently.  Given his worsening symptoms, I have recommended proceeding with cardiac cath. I reviewed this with Dr. Casandra Doffing (DOD0 and he agreed.  Risks and benefits of cardiac  catheterization have been discussed with the patient.  These include bleeding, infection, kidney damage, stroke, heart attack, death.  The patient understands these risks  and is willing to proceed.  He will need to hold Eliquis for 3 doses prior to his cath and resume Eliquis when felt to be safe.  He will check his wife's schedule and contact us later to decide on a date in the next 1-2 weeks.  He knows to go to the ED if he has worsening symptoms.  BP too soft to add nitrates. He was intol of beta-blocker in the past with low HR.  Continue prn NTG for now.  2. Paroxysmal Atrial Flutter:  Hx of tachycardia induced CM with recovery of LVF in NSR.  S/p RFCA with recurrence and subsequent DCCV.  Maintaining NSR.  He remains on Eliquis for stroke prophylaxis.  Hold Eliquis x 3 doses prior to his cath. He has no hx of CVA.    3. Chronic Diastolic CHF:  Volume appears stable.  Continue current Rx.  4. Aortic Stenosis:  Functionally bicuspid AV with mean gradient 10 mmHg at last echo in 10/15.  Murmur does not suggest worsening at this time.  Consider FU echo at some point in the next 6-12 mos.   5. HTN:  Controlled.  6. Hyperlipidemia:  Continue statin.     Medication Changes: Current medicines are reviewed at length with the patient today.  Concerns regarding medicines are as outlined above.  The following changes have been made:   Discontinued Medications   No medications on file   Modified Medications   No medications on file   New Prescriptions   No medications on file   Labs/ tests ordered today include:   Orders Placed This Encounter  Procedures  . Basic Metabolic Panel (BMET)  . CBC w/Diff  . INR/PT  . EKG 12-Lead     Disposition:    FU with Dr. Virl Axe 2 weeks post cath.     Signed, Versie Starks, MHS 04/21/2015 5:46 PM    [facility] [place], [place],   [address] Phone: [phone]; Fax: [phone]     I have examined the patient and reviewed assessment and plan and discussed with patient.  Agree with above as stated.  Worsening anginal sx.  Medical therapy has not been as effective  as before in a patient with known abnormal nuclear stress test.  Plan cath.  VARANASI,JAYADEEP S.

## 2015-04-20 NOTE — Telephone Encounter (Signed)
He should be seen.

## 2015-04-21 ENCOUNTER — Encounter: Payer: Self-pay | Admitting: *Deleted

## 2015-04-21 ENCOUNTER — Inpatient Hospital Stay (HOSPITAL_COMMUNITY)
Admission: EM | Admit: 2015-04-21 | Discharge: 2015-04-25 | DRG: 378 | Disposition: A | Discharge status: Home / Self Care | Payer: PPO | Attending: Internal Medicine | Admitting: Internal Medicine

## 2015-04-21 ENCOUNTER — Telehealth: Payer: Self-pay | Admitting: *Deleted

## 2015-04-21 ENCOUNTER — Telehealth: Payer: Self-pay | Admitting: Physician Assistant

## 2015-04-21 ENCOUNTER — Encounter (HOSPITAL_COMMUNITY): Payer: Self-pay | Admitting: Emergency Medicine

## 2015-04-21 ENCOUNTER — Encounter: Payer: Self-pay | Admitting: Physician Assistant

## 2015-04-21 ENCOUNTER — Emergency Department (HOSPITAL_COMMUNITY): Payer: PPO

## 2015-04-21 ENCOUNTER — Ambulatory Visit (INDEPENDENT_AMBULATORY_CARE_PROVIDER_SITE_OTHER): Payer: PPO | Admitting: Physician Assistant

## 2015-04-21 ENCOUNTER — Other Ambulatory Visit: Payer: Self-pay

## 2015-04-21 VITALS — BP 110/60 | HR 72 | Ht 69.0 in | Wt 222.6 lb

## 2015-04-21 DIAGNOSIS — K5730 Diverticulosis of large intestine without perforation or abscess without bleeding: Secondary | ICD-10-CM | POA: Diagnosis present

## 2015-04-21 DIAGNOSIS — I5032 Chronic diastolic (congestive) heart failure: Secondary | ICD-10-CM | POA: Diagnosis not present

## 2015-04-21 DIAGNOSIS — Z66 Do not resuscitate: Secondary | ICD-10-CM | POA: Diagnosis present

## 2015-04-21 DIAGNOSIS — R195 Other fecal abnormalities: Secondary | ICD-10-CM | POA: Diagnosis not present

## 2015-04-21 DIAGNOSIS — D500 Iron deficiency anemia secondary to blood loss (chronic): Secondary | ICD-10-CM | POA: Diagnosis present

## 2015-04-21 DIAGNOSIS — K641 Second degree hemorrhoids: Secondary | ICD-10-CM | POA: Diagnosis present

## 2015-04-21 DIAGNOSIS — I248 Other forms of acute ischemic heart disease: Secondary | ICD-10-CM | POA: Diagnosis present

## 2015-04-21 DIAGNOSIS — K644 Residual hemorrhoidal skin tags: Secondary | ICD-10-CM | POA: Diagnosis present

## 2015-04-21 DIAGNOSIS — Q2733 Arteriovenous malformation of digestive system vessel: Secondary | ICD-10-CM

## 2015-04-21 DIAGNOSIS — Z8719 Personal history of other diseases of the digestive system: Secondary | ICD-10-CM | POA: Diagnosis present

## 2015-04-21 DIAGNOSIS — I480 Paroxysmal atrial fibrillation: Secondary | ICD-10-CM

## 2015-04-21 DIAGNOSIS — K922 Gastrointestinal hemorrhage, unspecified: Secondary | ICD-10-CM | POA: Diagnosis not present

## 2015-04-21 DIAGNOSIS — F1010 Alcohol abuse, uncomplicated: Secondary | ICD-10-CM | POA: Diagnosis present

## 2015-04-21 DIAGNOSIS — Q231 Congenital insufficiency of aortic valve: Secondary | ICD-10-CM | POA: Diagnosis not present

## 2015-04-21 DIAGNOSIS — N183 Chronic kidney disease, stage 3 (moderate): Secondary | ICD-10-CM | POA: Diagnosis present

## 2015-04-21 DIAGNOSIS — I10 Essential (primary) hypertension: Secondary | ICD-10-CM | POA: Diagnosis present

## 2015-04-21 DIAGNOSIS — G4733 Obstructive sleep apnea (adult) (pediatric): Secondary | ICD-10-CM | POA: Diagnosis present

## 2015-04-21 DIAGNOSIS — K2950 Unspecified chronic gastritis without bleeding: Secondary | ICD-10-CM | POA: Diagnosis present

## 2015-04-21 DIAGNOSIS — K2990 Gastroduodenitis, unspecified, without bleeding: Secondary | ICD-10-CM | POA: Diagnosis present

## 2015-04-21 DIAGNOSIS — Z87891 Personal history of nicotine dependence: Secondary | ICD-10-CM

## 2015-04-21 DIAGNOSIS — Z79899 Other long term (current) drug therapy: Secondary | ICD-10-CM

## 2015-04-21 DIAGNOSIS — D124 Benign neoplasm of descending colon: Secondary | ICD-10-CM | POA: Diagnosis present

## 2015-04-21 DIAGNOSIS — I4891 Unspecified atrial fibrillation: Secondary | ICD-10-CM | POA: Diagnosis present

## 2015-04-21 DIAGNOSIS — K2970 Gastritis, unspecified, without bleeding: Secondary | ICD-10-CM | POA: Diagnosis not present

## 2015-04-21 DIAGNOSIS — E785 Hyperlipidemia, unspecified: Secondary | ICD-10-CM | POA: Diagnosis present

## 2015-04-21 DIAGNOSIS — Z7901 Long term (current) use of anticoagulants: Secondary | ICD-10-CM | POA: Insufficient documentation

## 2015-04-21 DIAGNOSIS — R9439 Abnormal result of other cardiovascular function study: Secondary | ICD-10-CM | POA: Diagnosis not present

## 2015-04-21 DIAGNOSIS — I130 Hypertensive heart and chronic kidney disease with heart failure and stage 1 through stage 4 chronic kidney disease, or unspecified chronic kidney disease: Secondary | ICD-10-CM | POA: Diagnosis present

## 2015-04-21 DIAGNOSIS — K921 Melena: Principal | ICD-10-CM | POA: Diagnosis present

## 2015-04-21 DIAGNOSIS — I25119 Atherosclerotic heart disease of native coronary artery with unspecified angina pectoris: Secondary | ICD-10-CM | POA: Diagnosis present

## 2015-04-21 DIAGNOSIS — I429 Cardiomyopathy, unspecified: Secondary | ICD-10-CM | POA: Diagnosis present

## 2015-04-21 DIAGNOSIS — N289 Disorder of kidney and ureter, unspecified: Secondary | ICD-10-CM

## 2015-04-21 DIAGNOSIS — R079 Chest pain, unspecified: Secondary | ICD-10-CM | POA: Diagnosis not present

## 2015-04-21 DIAGNOSIS — I350 Nonrheumatic aortic (valve) stenosis: Secondary | ICD-10-CM

## 2015-04-21 HISTORY — DX: Dependence on other enabling machines and devices: Z99.89

## 2015-04-21 HISTORY — DX: Obstructive sleep apnea (adult) (pediatric): G47.33

## 2015-04-21 LAB — ABO/RH: ABO/RH(D): O NEG

## 2015-04-21 LAB — BASIC METABOLIC PANEL
ANION GAP: 9 (ref 5–15)
BUN: 26 mg/dL — AB (ref 7–25)
BUN: 26 mg/dL — ABNORMAL HIGH (ref 6–20)
CALCIUM: 8.9 mg/dL (ref 8.9–10.3)
CHLORIDE: 109 mmol/L (ref 101–111)
CO2: 24 mmol/L (ref 20–31)
CO2: 22 mmol/L (ref 22–32)
CREATININE: 1.13 mg/dL — AB (ref 0.70–1.11)
CREATININE: 1.36 mg/dL — AB (ref 0.61–1.24)
Calcium: 8.8 mg/dL (ref 8.6–10.3)
Chloride: 105 mmol/L (ref 98–110)
GFR, EST AFRICAN AMERICAN: 55 mL/min — AB (ref >60)
GFR, EST NON AFRICAN AMERICAN: 48 mL/min — AB (ref >60)
GLUCOSE: 107 mg/dL — AB (ref 65–99)
Glucose, Bld: 98 mg/dL (ref 65–99)
POTASSIUM: 4.5 mmol/L (ref 3.5–5.3)
POTASSIUM: 4.7 mmol/L (ref 3.5–5.1)
SODIUM: 140 mmol/L (ref 135–145)
Sodium: 136 mmol/L (ref 135–146)

## 2015-04-21 LAB — CBC WITH DIFFERENTIAL/PLATELET
BASOS ABS: 0.1 10*3/uL (ref 0.0–0.1)
BASOS ABS: 0.1 10*3/uL (ref 0.0–0.1)
BASOS PCT: 1 % (ref 0–1)
BASOS PCT: 1 %
EOS ABS: 0.4 10*3/uL (ref 0.0–0.7)
EOS ABS: 0.3 10*3/uL (ref 0.0–0.7)
EOS PCT: 6 % — AB (ref 0–5)
Eosinophils Relative: 5 %
HCT: 22.3 % — ABNORMAL LOW (ref 39.0–52.0)
HCT: 22.8 % — ABNORMAL LOW (ref 39.0–52.0)
HEMOGLOBIN: 7.1 g/dL — AB (ref 13.0–17.0)
Hemoglobin: 7 g/dL — ABNORMAL LOW (ref 13.0–17.0)
LYMPHS ABS: 2 10*3/uL (ref 0.7–4.0)
Lymphocytes Relative: 24 % (ref 12–46)
Lymphocytes Relative: 27 %
Lymphs Abs: 1.7 10*3/uL (ref 0.7–4.0)
MCH: 27.1 pg (ref 26.0–34.0)
MCH: 27.3 pg (ref 26.0–34.0)
MCHC: 31.4 g/dL (ref 30.0–36.0)
MCHC: 31.1 g/dL (ref 30.0–36.0)
MCV: 86.4 fL (ref 78.0–100.0)
MCV: 87.7 fL (ref 78.0–100.0)
MPV: 9.7 fL (ref 8.6–12.4)
Monocytes Absolute: 0.9 10*3/uL (ref 0.1–1.0)
Monocytes Absolute: 0.5 10*3/uL (ref 0.1–1.0)
Monocytes Relative: 12 % (ref 3–12)
Monocytes Relative: 7 %
NEUTROS PCT: 57 % (ref 43–77)
NEUTROS PCT: 60 %
Neutro Abs: 4.1 10*3/uL (ref 1.7–7.7)
Neutro Abs: 4.4 10*3/uL (ref 1.7–7.7)
PLATELETS: 330 10*3/uL (ref 150–400)
PLATELETS: 305 10*3/uL (ref 150–400)
RBC: 2.58 MIL/uL — AB (ref 4.22–5.81)
RBC: 2.6 MIL/uL — AB (ref 4.22–5.81)
RDW: 14.4 % (ref 11.5–15.5)
RDW: 13.8 % (ref 11.5–15.5)
WBC: 7.2 10*3/uL (ref 4.0–10.5)
WBC: 7.3 10*3/uL (ref 4.0–10.5)

## 2015-04-21 LAB — PROTIME-INR
INR: 1.39 (ref <1.50)
INR: 1.29 (ref 0.00–1.49)
PROTHROMBIN TIME: 17.2 s — AB (ref 11.6–15.2)
Prothrombin Time: 16.3 seconds — ABNORMAL HIGH (ref 11.6–15.2)

## 2015-04-21 LAB — POC OCCULT BLOOD, ED: Fecal Occult Bld: POSITIVE — AB

## 2015-04-21 LAB — TROPONIN I: TROPONIN I: 0.07 ng/mL — AB (ref <0.031)

## 2015-04-21 LAB — SAMPLE TO BLOOD BANK

## 2015-04-21 MED ORDER — SODIUM CHLORIDE 0.9 % IV SOLN
8.0000 mg/h | INTRAVENOUS | Status: DC
  Filled 2015-04-21: qty 80

## 2015-04-21 MED ORDER — SODIUM CHLORIDE 0.9 % IV SOLN
80.0000 mg | Freq: Once | INTRAVENOUS | Status: AC
  Administered 2015-04-21: 80 mg via INTRAVENOUS
  Filled 2015-04-21: qty 80

## 2015-04-21 MED ORDER — PANTOPRAZOLE SODIUM 40 MG IV SOLR: 40.0000 mg | Freq: Two times a day (BID) | INTRAVENOUS | Status: DC

## 2015-04-21 NOTE — Telephone Encounter (Signed)
Ptcb earlier today and gave some date good for his upcoming cath. Pt has now been scheduled for 12/14 for cath. Instructions given by phone as well as mailed to pt today. Pt verbalized understanding to instructions.

## 2015-04-21 NOTE — Telephone Encounter (Signed)
Patient saw Richardson Dopp, PA today- will forward to [facility].

## 2015-04-21 NOTE — Patient Instructions (Signed)
Medication Instructions:  Your physician recommends that you continue on your current medications as directed. Please refer to the Current Medication list given to you today.   Labwork: TODAY PRE CATH LAB WORK BMET, CBC W/DIFF, PT/INR  Testing/Procedures: Your physician has requested that you have a cardiac catheterization. Cardiac catheterization is used to diagnose and/or treat various heart conditions. Doctors may recommend this procedure for a number of different reasons. The most common reason is to evaluate chest pain. Chest pain can be a symptom of coronary artery disease (CAD), and cardiac catheterization can show whether plaque is narrowing or blocking your heart's arteries. This procedure is also used to evaluate the valves, as well as measure the blood flow and oxygen levels in different parts of your heart. For further information please visit [facility]uge[facility]iesta[facility]tn. Please follow instruction sheet, as given.  PLEASE CALL BACK LATER TODAY [phone] WITH A COUPLE OF DIFFERENT DATES THAT WILL WORK FOR YOU FOR YOUR CATH;. REMEMBER THAT YOU WILL NEED TO HOLD YOUR ELIQUIS 3 DAYS PRIOR TO CATH  Follow-Up: DR, Caryl Comes 2 WEEKS POST CATH  Any Other Special Instructions Will Be Listed Below (If Applicable).   If you need a refill on your cardiac medications before your next appointment, please call your pharmacy.

## 2015-04-21 NOTE — ED Notes (Addendum)
Pt. received a call from MD's office advised him to go to ER due to low hemoglobin, result from blood tests done at clinic this morning , denies pain or discomfort / respirations unlabored . Denies hemoptysis , hematuria or rectal bleeding .

## 2015-04-21 NOTE — Telephone Encounter (Signed)
Follow Up  Pt returned call to schedule the procedure, Please call

## 2015-04-21 NOTE — ED Notes (Addendum)
Glick MD at bedside  

## 2015-04-21 NOTE — ED Notes (Signed)
Attempted to call report x 1  

## 2015-04-21 NOTE — H&P (Signed)
History and Physical  Patient Name: Donald [name]     [id number]    DOB: [birth year]    DOA: 04/21/2015 Referring physician: Delora Fuel, MD PCP: [name], MD      Chief Complaint: Exertional dyspnea  HPI: Donald [name] is a 81 y.o. male with a past medical history significant for pAF s/p ablation on Eliquis, OSA on CPAP, HTN, and chronic diastolic CHF who presents with worsening exertional dyspnea, found at his Cardiologist to have new anemia.  The patient was in his usual state of health until about 3 weeks ago when he started to develop exertional intolerance and chest pressure. Gradually he has noticed more of this dyspnea and chest pressure while working out at [employer], working for [employer], volunteering putting back books at school and so he has had to limit these activities.The chest discomfort he describes as a pressure that radiates into the throat and is similar to anginal pain he has had with A. fib in the past.  For these reasons he went to see his cardiologist's office today, who recommended cardiac catheterization within the next 2 weeks (given a NM stress this past summer showing reversible defect). On routine preprocedure blood work the patient was noted to have hemoglobin of 7 g/dL so he was called and referred to the ER.  In the ED, he was FOBT positive but denied frank bleeding and was hemodynamically stable.  TRH were asked to admit for anemia and GI bleeding.     Review of Systems:  Pt complains of exertional intolerance, dyspnea on exertion, angina. Pt denies any hematuria, hematochezia, melena, hematemesis, back pain, belly pain, pallor.  All other systems negative except as just noted or noted in the history of present illness.  No Known Allergies  Prior to Admission medications   Medication Sig Start Date End Date Taking? Authorizing Provider  apixaban (ELIQUIS) 5 MG TABS tablet Take 1 tablet (5 mg total) by mouth 2 (two) times daily. 11/04/14  Yes Deboraha Sprang, MD  atorvastatin (LIPITOR) 40 MG tablet TAKE 1 TABLET ONCE DAILY. 01/29/15  Yes Janith Lima, MD  diltiazem (DILACOR XR) 240 MG 24 hr capsule Take 240 mg by mouth daily as needed (increased heart rate).   Yes Historical Provider, MD  fluticasone (FLONASE) 50 MCG/ACT nasal spray Place 1 spray into both nostrils daily as needed for allergies or rhinitis. **SHAKE GENTLY**   Yes Historical Provider, MD  furosemide (LASIX) 40 MG tablet TAKE 1 TABLET ONCE DAILY. 02/26/15  Yes Janith Lima, MD  loratadine (CLARITIN) 10 MG tablet Take 10 mg by mouth daily as needed for allergies.    Yes Historical Provider, MD  losartan (COZAAR) 100 MG tablet Take 1 tablet (100 mg total) by mouth daily. 05/02/14  Yes Deboraha Sprang, MD  Multiple Vitamins-Minerals (EYE VITAMINS PO) Take 1 tablet by mouth 2 (two) times daily. Macular Supplement   Yes Historical Provider, MD  Multiple Vitamins-Minerals (MULTIVITAMIN GUMMIES ADULT PO) Take 1 tablet by mouth 2 (two) times daily.   Yes Historical Provider, MD  nitroGLYCERIN (NITROSTAT) 0.4 MG SL tablet Place 1 tablet (0.4 mg total) under the tongue every 5 (five) minutes as needed for chest pain. 12/23/14  Yes Deboraha Sprang, MD  pantoprazole (PROTONIX) 20 MG tablet Take 20 mg by mouth daily.   Yes Historical Provider, MD    Past Medical History  Diagnosis Date  . Hypertension   . Sinus bradycardia   . ED (erectile  dysfunction)   . Arthropathy, unspecified, site unspecified   . Prostatitis, unspecified   . Personal history of other diseases of digestive system   . Allergic rhinitis due to pollen   . Benign neoplasm of colon   . Diverticulosis   . Skin cancer     basal and squamous cell  . Abnormal CT scan, stomach     a. Profound thickening of the fundus and cardia of the stomach, suspicious for potential neoplasm by CT 02/2014. Referred to GI.  Marland Kitchen Atherosclerosis     a. Noted by abdominal CT 02/2014 (h/o normal nuc 2011).  . Habitual alcohol use   .  Snoring     a. Pending OSA eval by pulm.  . Hyperlipidemia   . Obesity   . Shortness of breath 12/2013  . Atrial flutter ([place]) 02/24/14    s/p ablation 11/15  . GERD (gastroesophageal reflux disease)   . PAF (paroxysmal atrial fibrillation) ([place])   . Cardiomyopathy ([place])     tachy mediated - a. TEE (10/15):  EF 30%;  b. Echo after NSR restored (10/15):  mild LVH, EF 55-60%, mild AS, mild AI, mild MR, mild to mod LAE, mild RAE  . Esophageal stricture   . Hiatal hernia   . Diverticulosis   . Atrial fibrillation [facility])     Past Surgical History  Procedure Laterality Date  . Inguinal hernia repair      right  . Inguinal hernia repair      left  . Tonsillectomy    . Orif fracture of the elbow    . Squamous cell carcinoma excision    . Skin cancer excision  05/21/12    Squamous cell ca  . Hernia repair    . Tee without cardioversion N/A 02/24/2014    Procedure: TRANSESOPHAGEAL ECHOCARDIOGRAM (TEE);  Surgeon: Lelon Perla, MD;  Location: [facility] ENDOSCOPY;  Service: Cardiovascular;  Laterality: N/A;  . Cardioversion N/A 02/24/2014    Procedure: CARDIOVERSION;  Surgeon: Lelon Perla, MD;  Location: [facility] ENDOSCOPY;  Service: Cardiovascular;  Laterality: N/A;  . Ablation  03/19/2014    RFCA of atrial flutter by Dr Caryl Comes  . Esophagogastroduodenoscopy N/A 04/09/2014    Procedure: ESOPHAGOGASTRODUODENOSCOPY (EGD);  Surgeon: Inda Castle, MD;  Location: [place];  Service: Endoscopy;  Laterality: N/A;  . Atrial flutter ablation N/A 03/19/2014    Procedure: ATRIAL FLUTTER ABLATION;  Surgeon: Deboraha Sprang, MD;  Location: [facility] CATH LAB;  Service: Cardiovascular;  Laterality: N/A;  . Cardioversion Right 09/10/2014    Procedure: CARDIOVERSION;  Surgeon: Deboraha Sprang, MD;  Location: [facility] CATH LAB;  Service: Cardiovascular;  Laterality: Right;    Family history: family history includes Cancer in his brother and mother; Other in his father; Pulmonary fibrosis in his brother. There is no  history of Diabetes, Coronary artery disease, Colon cancer, Stomach cancer, or Rectal cancer.  Social History: Patient lives with his wife. He is a former Land. He grew up in [place]. He is a former smoker quit 6 years ago.       Physical Exam: BP 131/68 mmHg  Pulse 71  Temp(Src) 97.8 F (36.6 C) (Oral)  Resp 20  Ht 5\' 9"  (1.753 m)  Wt 100.699 kg (222 lb)  BMI 32.77 kg/m2  SpO2 97% General appearance: Well-developed, older adult male, alert and in no acute distress.   Eyes: Anicteric, lids and lashes normal.     ENT: No nasal deformity, discharge, or epistaxis.  OP moist  without lesions.   Skin: Warm and dry. Mild pallor.  No flank bruising. Cardiac: RRR, nl S1-S2, 1/6 SEM at LUSB.  Capillary refill is brisk.  1+ pretibial LE edema.  Radial pulses 2+ and symmetric.  No carotid bruits. Respiratory: Normal respiratory rate and rhythm.  CTAB without rales or wheezes. Abdomen: Abdomen soft without rigidity.  No TTP. No ascites, distension.   MSK: No deformities or effusions. Neuro: Sensorium intact and responding to questions, attention normal.  Speech is fluent.  Moves all extremities equally and with normal coordination.    Psych: Behavior appropriate.  Affect normal.  No evidence of aural or visual hallucinations or delusions.       Labs on Admission:  The metabolic panel shows normal sodium, potassium, bicarbonate. Elevated BUN to creatinine ratio. The serum creatinine is stable at 1.36 mg/dL. The INR is 1.4. FOBT is positive. The complete blood count shows hemoglobin 7.1 g/dL from 13 g/dL in September. Normal platelet count.   Radiological Exams on Admission: Personally reviewed: Dg Chest Port 1 View 04/21/2015  No focal opacity or effusion on 1 view.   EKG: Independently reviewed. Sinus, rate 74.     EGD 2015: "Endoscopy was performed with Dr. Deatra Ina in November 2015 and showed benign-appearing GE junction stricture, small hiatal hernia, chronic  gastritis without mass lesion. Biopsy showed chronic active gastritis without H. pylori, dysplasia or malignancy."         Assessment/Plan 1. Blood loss anemia:  This is new.  Discrete bleeding is doubted.  Source suspected upper, given known gastritis.  Malignancy is within differential.   -Hold apixaban -Consult to GI, appreciate recommendations regarding need for endoscopy or not -Trend CBC -Will defer transfusion to Cardiology, given patient preference and #2 below, type and screen completed   2. Angina:  This is new.  Suspect this is acutely worsening because of anemia rather than unstable angina.  -Recommended transfusion to patient, he would like to discuss with Cardiology -Consult to Cardiology regarding transfusion threshold, and need for LHC given new finding   3. Paroxysmal A. fib:  CHADS2-VASc 4.  This corresponds to ~5-7% risk of TIA/stroke per year.   Hold apixaban  4. OSA:  Continue home CPAP  5. CKD stage III:  Stable.   6. Chronic diastolic CHF and HTN:  Stable. Euvolemic. Hold furosemide unless transfused Continue ARB      DVT PPx: SCDs Diet: NPO with sips Consultants: GI and Cardiology Code Status: DNR   Medical decision making: What exists of the patient's previous chart was reviewed in depth and the case was discussed with Dr. Roxanne Mins. Patient seen 11:37 PM on 04/21/2015.  Disposition Plan:  Admit for suspected slow UGIB.  Consultation with specialty services tomorrow, and anticipate transfusion and possilby endoscopy, per GI.      Edwin Dada [facility] Pager [phone]

## 2015-04-21 NOTE — Telephone Encounter (Signed)
Patient got a voice mail from cardiology telling him to go to the ED, however he could not make out the reason why. I reviewed the record and explained to him that he is on blood thinner eliquis for afib. His hgb 3 month ago was 13.0, now it is 7.0. He denies any dizziness but states his stool has been darker recently. He denies obvious blood. His GI doc is Dr. Hilarie Fredrickson.  He agreed to come to the ED. Eliquis likely need to be held.   Hilbert Corrigan PA Pager: [phone]

## 2015-04-21 NOTE — ED Provider Notes (Addendum)
CSN: [id number]     Arrival date & time 04/21/15  1933 History   First MD Initiated Contact with Patient 04/21/15 2204     Chief Complaint  Patient presents with  . Anemia     (Consider location/radiation/quality/duration/timing/severity/associated sxs/prior Treatment) Patient is a 81 y.o. male presenting with anemia. The history is provided by the patient.  Anemia  He has had a 3 week history of gradually progressive dyspnea on exertion and exertional chest discomfort. He describes the chest discomfort as indigestion and tightness which goes away within 5 minutes of resting. He states that he thinks he can walk 20 feet but he cannot walk the eyelid in a supermarket without getting dyspneic. There is no associated nausea, vomiting, diaphoresis. He is anticoagulated on apixaban for atrial fibrillation. He had seen his cardiologist today who had scheduled a stress test based on decreasing exercise tolerance. Screening labs were obtained and he was called when his hemoglobin came back at 7 and told to come to the ED. He denies taking aspirin or other NSAIDs. He denies history of ulcer or GI bleeding. He has noted stools have been darker than normal.  Past Medical History  Diagnosis Date  . Hypertension   . Sinus bradycardia   . ED (erectile dysfunction)   . Arthropathy, unspecified, site unspecified   . Prostatitis, unspecified   . Personal history of other diseases of digestive system   . Allergic rhinitis due to pollen   . Benign neoplasm of colon   . Diverticulosis   . Skin cancer     basal and squamous cell  . Abnormal CT scan, stomach     a. Profound thickening of the fundus and cardia of the stomach, suspicious for potential neoplasm by CT 02/2014. Referred to GI.  Marland Kitchen Atherosclerosis     a. Noted by abdominal CT 02/2014 (h/o normal nuc 2011).  . Habitual alcohol use   . Snoring     a. Pending OSA eval by pulm.  . Hyperlipidemia   . Obesity   . Shortness of breath 12/2013  .  Atrial flutter ([place]) 02/24/14    s/p ablation 11/15  . GERD (gastroesophageal reflux disease)   . PAF (paroxysmal atrial fibrillation) ([place])   . Cardiomyopathy ([place])     tachy mediated - a. TEE (10/15):  EF 30%;  b. Echo after NSR restored (10/15):  mild LVH, EF 55-60%, mild AS, mild AI, mild MR, mild to mod LAE, mild RAE  . Esophageal stricture   . Hiatal hernia   . Diverticulosis   . Atrial fibrillation [facility])    Past Surgical History  Procedure Laterality Date  . Inguinal hernia repair      right  . Inguinal hernia repair      left  . Tonsillectomy    . Orif fracture of the elbow    . Squamous cell carcinoma excision    . Skin cancer excision  05/21/12    Squamous cell ca  . Hernia repair    . Tee without cardioversion N/A 02/24/2014    Procedure: TRANSESOPHAGEAL ECHOCARDIOGRAM (TEE);  Surgeon: Lelon Perla, MD;  Location: [facility] ENDOSCOPY;  Service: Cardiovascular;  Laterality: N/A;  . Cardioversion N/A 02/24/2014    Procedure: CARDIOVERSION;  Surgeon: Lelon Perla, MD;  Location: [facility] ENDOSCOPY;  Service: Cardiovascular;  Laterality: N/A;  . Ablation  03/19/2014    RFCA of atrial flutter by Dr Caryl Comes  . Esophagogastroduodenoscopy N/A 04/09/2014    Procedure: ESOPHAGOGASTRODUODENOSCOPY (EGD);  Surgeon:  Inda Castle, MD;  Location: [place];  Service: Endoscopy;  Laterality: N/A;  . Atrial flutter ablation N/A 03/19/2014    Procedure: ATRIAL FLUTTER ABLATION;  Surgeon: Deboraha Sprang, MD;  Location: [facility] CATH LAB;  Service: Cardiovascular;  Laterality: N/A;  . Cardioversion Right 09/10/2014    Procedure: CARDIOVERSION;  Surgeon: Deboraha Sprang, MD;  Location[facility] CATH LAB;  Service: Cardiovascular;  Laterality: Right;   Family History  Problem Relation Age of Onset  . Cancer Mother     ovarian  . Other Father     renal disease- grief over loss of spouse  . Cancer Brother     prostate- died of hematologic disorder 2nd to chemo  . Diabetes Neg Hx   . Coronary artery  disease Neg Hx   . Colon cancer Neg Hx   . Stomach cancer Neg Hx   . Rectal cancer Neg Hx   . Pulmonary fibrosis Brother    Social History  Substance Use Topics  . Smoking status: Former Smoker -- 0.00 packs/day for 40 years    Types: Cigarettes, Cigars    Quit date: 09/18/2008  . Smokeless tobacco: Current User    Types: Chew     Comment: Has Occasional Cigar/ quit   . Alcohol Use: Yes    Review of Systems  All other systems reviewed and are negative.     Allergies  Review of patient's allergies indicates no known allergies.  Home Medications   Prior to Admission medications   Medication Sig Start Date End Date Taking? Authorizing Provider  apixaban (ELIQUIS) 5 MG TABS tablet Take 1 tablet (5 mg total) by mouth 2 (two) times daily. 11/04/14   Deboraha Sprang, MD  atorvastatin (LIPITOR) 40 MG tablet TAKE 1 TABLET ONCE DAILY. 01/29/15   Janith Lima, MD  diltiazem (DILACOR XR) 240 MG 24 hr capsule Take 240 mg by mouth daily as needed (increased heart rate).    Historical Provider, MD  fluticasone (FLONASE) 50 MCG/ACT nasal spray Place 1 spray into both nostrils daily as needed for allergies or rhinitis. **SHAKE GENTLY**    Historical Provider, MD  furosemide (LASIX) 40 MG tablet TAKE 1 TABLET ONCE DAILY. 02/26/15   Janith Lima, MD  loratadine (CLARITIN) 10 MG tablet Take 10 mg by mouth daily as needed for allergies.     Historical Provider, MD  losartan (COZAAR) 100 MG tablet Take 1 tablet (100 mg total) by mouth daily. 05/02/14   Deboraha Sprang, MD  Multiple Vitamins-Minerals (EYE VITAMINS PO) Take 1 tablet by mouth 2 (two) times daily. Macular Supplement    Historical Provider, MD  Multiple Vitamins-Minerals (MULTIVITAMIN GUMMIES ADULT PO) Take 1 tablet by mouth 2 (two) times daily.    Historical Provider, MD  nitroGLYCERIN (NITROSTAT) 0.4 MG SL tablet Place 1 tablet (0.4 mg total) under the tongue every 5 (five) minutes as needed for chest pain. 12/23/14   Deboraha Sprang, MD   pantoprazole (PROTONIX) 20 MG tablet Take 20 mg by mouth daily.    Historical Provider, MD   BP 140/81 mmHg  Pulse 93  Temp(Src) 97.8 F (36.6 C) (Oral)  Resp 16  Ht 5\' 9"  (1.753 m)  Wt 222 lb (100.699 kg)  BMI 32.77 kg/m2  SpO2 97% Physical Exam  Nursing note and vitals reviewed.  81 year old male, resting comfortably and in no acute distress. Vital signs are normal. Oxygen saturation is 97%, which is normal. Head is normocephalic and atraumatic.  PERRLA, EOMI. Oropharynx is clear. Conjunctivae are pale. Neck is nontender and supple without adenopathy or JVD. Back is nontender and there is no CVA tenderness. Lungs are clear without rales, wheezes, or rhonchi. Chest is nontender. Heart has regular rate and rhythm with A999333 systolic ejection murmur heard at the left sternal border. Abdomen is soft, flat, nontender without masses or hepatosplenomegaly and peristalsis is normoactive. Rectal: Normal sphincter tone. Stool is light brown but Hemoccult positive Extremities have 2-3+ edema, full range of motion is present. Skin is warm and dry without rash. Neurologic: Mental status is normal, cranial nerves are intact, there are no motor or sensory deficits.  ED Course  Procedures (including critical care time) Labs Review Results for orders placed or performed during the hospital encounter of 04/21/15  CBC with Differential  Result Value Ref Range   WBC 7.3 4.0 - 10.5 K/uL   RBC 2.60 (L) 4.22 - 5.81 MIL/uL   Hemoglobin 7.1 (L) 13.0 - 17.0 g/dL   HCT 22.8 (L) 39.0 - 52.0 %   MCV 87.7 78.0 - 100.0 fL   MCH 27.3 26.0 - 34.0 pg   MCHC 31.1 30.0 - 36.0 g/dL   RDW 13.8 11.5 - 15.5 %   Platelets 305 150 - 400 K/uL   Neutrophils Relative % 60 %   Neutro Abs 4.4 1.7 - 7.7 K/uL   Lymphocytes Relative 27 %   Lymphs Abs 2.0 0.7 - 4.0 K/uL   Monocytes Relative 7 %   Monocytes Absolute 0.5 0.1 - 1.0 K/uL   Eosinophils Relative 5 %   Eosinophils Absolute 0.3 0.0 - 0.7 K/uL   Basophils  Relative 1 %   Basophils Absolute 0.1 0.0 - 0.1 K/uL  Basic metabolic panel  Result Value Ref Range   Sodium 140 135 - 145 mmol/L   Potassium 4.7 3.5 - 5.1 mmol/L   Chloride 109 101 - 111 mmol/L   CO2 22 22 - 32 mmol/L   Glucose, Bld 107 (H) 65 - 99 mg/dL   BUN 26 (H) 6 - 20 mg/dL   Creatinine, Ser 1.36 (H) 0.61 - 1.24 mg/dL   Calcium 8.9 8.9 - 10.3 mg/dL   GFR calc non Af Amer 48 (L) >60 mL/min   GFR calc Af Amer 55 (L) >60 mL/min   Anion gap 9 5 - 15  Protime-INR  Result Value Ref Range   Prothrombin Time 16.3 (H) 11.6 - 15.2 seconds   INR 1.29 0.00 - 1.49  POC occult blood, ED  Result Value Ref Range   Fecal Occult Bld POSITIVE (A) NEGATIVE  Sample to Blood Bank  Result Value Ref Range   Blood Bank Specimen SAMPLE AVAILABLE FOR TESTING    Sample Expiration 04/22/2015   Type and screen [place]  Result Value Ref Range   ABO/RH(D) O NEG    Antibody Screen NEG    Sample Expiration 04/24/2015    Imaging Review Dg Chest Port 1 View  04/21/2015  CLINICAL DATA:  81 year old male with shortness of breath EXAM: PORTABLE CHEST 1 VIEW COMPARISON:  Chest radiograph dated 07/17/2012 and read radiograph dated 06/26/2014 FINDINGS: Single portable view of the chest does not demonstrate any focal consolidation. There is minimal bibasilar atelectatic changes. Trace fluid may be present in the right minor fissure. There is no pneumothorax. Stable cardiac silhouette. The osseous structures are grossly unremarkable. IMPRESSION: No focal consolidation. Electronically Signed   By: Anner Crete M.D.   On: 04/21/2015 23:01  I have personally reviewed and evaluated these images and lab results as part of my medical decision-making.   EKG Interpretation   Date/Time:  Tuesday April 21 2015 22:19:37 EST Ventricular Rate:  74 PR Interval:  138 QRS Duration: 99 QT Interval:  406 QTC Calculation: 450 R Axis:   32 Text Interpretation:  Sinus rhythm Abnormal R-wave  progression, early  transition When compared with ECG of 09/10/2014, Sinus rhythm has replaced  Atrial flutter Nonspecific ST abnormality is no longer Present Confirmed  by  [facility]  MD, Alaya Iverson ([id number]) on 04/21/2015 10:33:43 PM      CRITICAL CARE Performed by: [id number] Total critical care time: 40 minutes Critical care time was exclusive of separately billable procedures and treating other patients. Critical care was necessary to treat or prevent imminent or life-threatening deterioration. Critical care was time spent personally by me on the following activities: development of treatment plan with patient and/or surrogate as well as nursing, discussions with consultants, evaluation of patient's response to treatment, examination of patient, obtaining history from patient or surrogate, ordering and performing treatments and interventions, ordering and review of laboratory studies, ordering and review of radiographic studies, pulse oximetry and re-evaluation of patient's condition.  MDM   Final diagnoses:  Anemia due to blood loss  Guaiac positive stools  Exertional chest pain  Anticoagulant long-term use  PAF (paroxysmal atrial fibrillation) (HCC)  Renal insufficiency    Progressive deterioration in exercise tolerance associated with drop in hemoglobin. Hemoglobin has dropped from 13 to 7 over the last several months.  Stool is Hemoccult positive confirming GI blood loss. Orthostatic vital signs did not quite show a significant drop in blood pressure, but there was still a drop from A999333 systolic. Case is discussed with Dr. Loleta Books of [facility] agrees to admit the patient.  Delora Fuel, MD [id number]  Delora Fuel, MD [id number]

## 2015-04-21 NOTE — Telephone Encounter (Signed)
I lmom on both pt's home and cell # to proceed to ED due to Hgb 7 which is down from 13 ;3 months and he is on Eliquis. Lmom do not take Eliquis tonight. I will check on pt tomorrow.

## 2015-04-22 ENCOUNTER — Encounter (HOSPITAL_COMMUNITY): Payer: Self-pay | Admitting: Physician Assistant

## 2015-04-22 DIAGNOSIS — I209 Angina pectoris, unspecified: Secondary | ICD-10-CM

## 2015-04-22 DIAGNOSIS — I480 Paroxysmal atrial fibrillation: Secondary | ICD-10-CM

## 2015-04-22 DIAGNOSIS — R195 Other fecal abnormalities: Secondary | ICD-10-CM

## 2015-04-22 DIAGNOSIS — K922 Gastrointestinal hemorrhage, unspecified: Secondary | ICD-10-CM

## 2015-04-22 DIAGNOSIS — I10 Essential (primary) hypertension: Secondary | ICD-10-CM

## 2015-04-22 DIAGNOSIS — D500 Iron deficiency anemia secondary to blood loss (chronic): Secondary | ICD-10-CM

## 2015-04-22 DIAGNOSIS — Z7901 Long term (current) use of anticoagulants: Secondary | ICD-10-CM | POA: Insufficient documentation

## 2015-04-22 LAB — HEMOGLOBIN AND HEMATOCRIT, BLOOD
HCT: 26.6 % — ABNORMAL LOW (ref 39.0–52.0)
HEMOGLOBIN: 8.4 g/dL — AB (ref 13.0–17.0)

## 2015-04-22 LAB — BASIC METABOLIC PANEL
ANION GAP: 6 (ref 5–15)
BUN: 24 mg/dL — AB (ref 6–20)
CHLORIDE: 110 mmol/L (ref 101–111)
CO2: 23 mmol/L (ref 22–32)
Calcium: 8.3 mg/dL — ABNORMAL LOW (ref 8.9–10.3)
Creatinine, Ser: 1.17 mg/dL (ref 0.61–1.24)
GFR calc Af Amer: >60 mL/min (ref >60)
GFR, EST NON AFRICAN AMERICAN: 57 mL/min — AB (ref >60)
GLUCOSE: 91 mg/dL (ref 65–99)
POTASSIUM: 3.8 mmol/L (ref 3.5–5.1)
Sodium: 139 mmol/L (ref 135–145)

## 2015-04-22 LAB — CBC
HEMATOCRIT: 20.8 % — AB (ref 39.0–52.0)
HEMOGLOBIN: 6.4 g/dL — AB (ref 13.0–17.0)
MCH: 26.6 pg (ref 26.0–34.0)
MCHC: 30.3 g/dL (ref 30.0–36.0)
MCV: 87.8 fL (ref 78.0–100.0)
PLATELETS: 262 10*3/uL (ref 150–400)
RBC: 2.37 MIL/uL — AB (ref 4.22–5.81)
RDW: 13.9 % (ref 11.5–15.5)
WBC: 6.4 10*3/uL (ref 4.0–10.5)

## 2015-04-22 LAB — PREPARE RBC (CROSSMATCH)

## 2015-04-22 LAB — MAGNESIUM: Magnesium: 2 mg/dL (ref 1.7–2.4)

## 2015-04-22 MED ORDER — PANTOPRAZOLE SODIUM 40 MG PO TBEC
40.0000 mg | DELAYED_RELEASE_TABLET | Freq: Every day | ORAL | Status: DC
  Administered 2015-04-23 – 2015-04-25 (×3): 40 mg via ORAL
  Filled 2015-04-22 (×3): qty 1

## 2015-04-22 MED ORDER — PEG-KCL-NACL-NASULF-NA ASC-C 100 G PO SOLR
1.0000 | Freq: Once | ORAL | Status: AC
  Administered 2015-04-22: 200 g via ORAL
  Filled 2015-04-22: qty 1

## 2015-04-22 MED ORDER — LOSARTAN POTASSIUM 50 MG PO TABS
100.0000 mg | ORAL_TABLET | Freq: Every day | ORAL | Status: DC
  Administered 2015-04-22 – 2015-04-24 (×3): 100 mg via ORAL
  Filled 2015-04-22 (×4): qty 2

## 2015-04-22 MED ORDER — SODIUM CHLORIDE 0.9 % IV SOLN
INTRAVENOUS | Status: DC
  Administered 2015-04-22: 20 mL via INTRAVENOUS

## 2015-04-22 MED ORDER — SODIUM CHLORIDE 0.9 % IV SOLN
INTRAVENOUS | Status: DC
  Administered 2015-04-22 – 2015-04-24 (×3): via INTRAVENOUS

## 2015-04-22 MED ORDER — ATORVASTATIN CALCIUM 40 MG PO TABS
40.0000 mg | ORAL_TABLET | Freq: Every day | ORAL | Status: DC
  Administered 2015-04-22 – 2015-04-24 (×3): 40 mg via ORAL
  Filled 2015-04-22 (×4): qty 1

## 2015-04-22 MED ORDER — SODIUM CHLORIDE 0.9 % IV SOLN
80.0000 mg | Freq: Two times a day (BID) | INTRAVENOUS | Status: DC
  Administered 2015-04-22: 80 mg via INTRAVENOUS
  Filled 2015-04-22 (×2): qty 80

## 2015-04-22 MED ORDER — SODIUM CHLORIDE 0.9 % IV SOLN
Freq: Once | INTRAVENOUS | Status: AC
  Administered 2015-04-22: 05:00:00 via INTRAVENOUS

## 2015-04-22 NOTE — Progress Notes (Signed)
Utilization review completed.  

## 2015-04-22 NOTE — Progress Notes (Signed)
CRITICAL VALUE ALERT  Critical value received:  Hemoglobin 6.4  Date of notification:  04/22/2015  Time of notification:  0425  Critical value read back:yes   Nurse who received alert: Ardis Rowan  MD notified (1st page):  Dr.Danford  Time of first page:  0500  MD notified (2nd page):  Time of second page:  Responding MD:  Dr.Danford  Time MD responded:  [phone]

## 2015-04-22 NOTE — Consult Note (Signed)
Patient ID: Donald [name] MRN: [id number], DOB/AGE: 1932-12-18   Admit date: 04/21/2015   Primary Physician: [name], MD Primary Cardiologist: Dr. Caryl Comes  Pt. Profile:  81 y/o male with h/o PAF on chronic anticoagulation w/ Eliquis, admitted for symptomatic anemia with Hgb of 6.4. FOBT +. Cards consulted for CP.    Problem List  Past Medical History  Diagnosis Date  . Hypertension   . Sinus bradycardia   . ED (erectile dysfunction)   . Arthropathy, unspecified, site unspecified   . Prostatitis, unspecified   . Personal history of other diseases of digestive system   . Allergic rhinitis due to pollen   . Benign neoplasm of colon   . Diverticulosis   . Skin cancer     basal and squamous cell  . Abnormal CT scan, stomach     a. Profound thickening of the fundus and cardia of the stomach, suspicious for potential neoplasm by CT 02/2014. Referred to GI.  Marland Kitchen Atherosclerosis     a. Noted by abdominal CT 02/2014 (h/o normal nuc 2011).  . Habitual alcohol use   . Snoring     a. Pending OSA eval by pulm.  . Hyperlipidemia   . Obesity   . Shortness of breath 12/2013  . Atrial flutter ([place]) 02/24/14    s/p ablation 11/15  . GERD (gastroesophageal reflux disease)   . PAF (paroxysmal atrial fibrillation) ([place])   . Cardiomyopathy ([place])     tachy mediated - a. TEE (10/15):  EF 30%;  b. Echo after NSR restored (10/15):  mild LVH, EF 55-60%, mild AS, mild AI, mild MR, mild to mod LAE, mild RAE  . Esophageal stricture   . Hiatal hernia   . Diverticulosis   . Atrial fibrillation [facility])     Past Surgical History  Procedure Laterality Date  . Inguinal hernia repair      right  . Inguinal hernia repair      left  . Tonsillectomy    . Orif fracture of the elbow    . Squamous cell carcinoma excision    . Skin cancer excision  05/21/12    Squamous cell ca  . Hernia repair    . Tee without cardioversion N/A 02/24/2014    Procedure: TRANSESOPHAGEAL ECHOCARDIOGRAM (TEE);   Surgeon: Lelon Perla, MD;  Location: [facility] ENDOSCOPY;  Service: Cardiovascular;  Laterality: N/A;  . Cardioversion N/A 02/24/2014    Procedure: CARDIOVERSION;  Surgeon: Lelon Perla, MD;  Location: [facility] ENDOSCOPY;  Service: Cardiovascular;  Laterality: N/A;  . Ablation  03/19/2014    RFCA of atrial flutter by Dr Caryl Comes  . Esophagogastroduodenoscopy N/A 04/09/2014    Procedure: ESOPHAGOGASTRODUODENOSCOPY (EGD);  Surgeon: Inda Castle, MD;  Location: [place];  Service: Endoscopy;  Laterality: N/A;  . Atrial flutter ablation N/A 03/19/2014    Procedure: ATRIAL FLUTTER ABLATION;  Surgeon: Deboraha Sprang, MD;  Location: [facility] CATH LAB;  Service: Cardiovascular;  Laterality: N/A;  . Cardioversion Right 09/10/2014    Procedure: CARDIOVERSION;  Surgeon: Deboraha Sprang, MD;  Location: [facility] CATH LAB;  Service: Cardiovascular;  Laterality: Right;     Allergies  No Known Allergies  HPI  81 y/o male, followed by Dr. Caryl Comes, with a h/o paroxsymal atrial flutter with associated tachycardia mediated cardiomyopathy. He has been on chronic anticoagulation with Eliquis. He underwent RFCA by Dr. Caryl Comes 03/2014 and his LV function improved once in normal sinus rthythm. His most recent 2D echo 02/2014 showed normal EF  at 55-60%. Also with h/o functionally bicuspid aortic valve, diastolic HF, HTN, HLD, ETOH abuse and  skin CA. In 03/2014, he also underwent an EGD to evaluate possible gastric neoplasm. Biopsy demonstrated chronic active gastritis only.In June 2016, the patient complained of chest pain and Dr. Caryl Comes ordered a myoview NST, which was intermediate risk with mild ischemia in the LCx/OM territory. It was decided to pursue medical therapy with PRN nitroglycerin prior to exercise and avoid invasive workup at that time. He was last seen by Dr. Caryl Comes 12/23/14. He noted improved exercise tolerance. He was no longer having chest discomfort.  He presented back to the office 04/20/15 with a complaint of decreased  exercise tolerance and chest discomfort with associated dyspnea with minimal activity. He was seen by Richardson Dopp, PA-C. Given his worsening symptoms and abnormal stress test earlier in the year, he recommended a LHC to assess his coronary anatomy. As part of pre-procedural w/u, basic labs including a CBC was ordered. He was found to have anemia with a hgb of 7.0. This was in comparision to 14.0 7 months ago and 13.0 3 months ago. He was instructed to report to the ED for admission. He was admitted by IM. Plan is for GI consult. He has had further reduction in Hgb, now at 6.4. FOBT is positive. He is on IV Protonix. Eliquis discontinued. Cardiology has been consulted for CP.   The patient notes a 3 week h/o melena, which correlates to the onset of his CP and decreased exercise tolerance. No symptoms at rest. EKG shows NSR w/o ischemic changes. BP is stable.     Home Medications  Prior to Admission medications   Medication Sig Start Date End Date Taking? Authorizing Provider  apixaban (ELIQUIS) 5 MG TABS tablet Take 1 tablet (5 mg total) by mouth 2 (two) times daily. 11/04/14  Yes Deboraha Sprang, MD  atorvastatin (LIPITOR) 40 MG tablet TAKE 1 TABLET ONCE DAILY. 01/29/15  Yes Janith Lima, MD  diltiazem (DILACOR XR) 240 MG 24 hr capsule Take 240 mg by mouth daily as needed (increased heart rate).   Yes Historical Provider, MD  fluticasone (FLONASE) 50 MCG/ACT nasal spray Place 1 spray into both nostrils daily as needed for allergies or rhinitis. **SHAKE GENTLY**   Yes Historical Provider, MD  furosemide (LASIX) 40 MG tablet TAKE 1 TABLET ONCE DAILY. 02/26/15  Yes Janith Lima, MD  loratadine (CLARITIN) 10 MG tablet Take 10 mg by mouth daily as needed for allergies.    Yes Historical Provider, MD  losartan (COZAAR) 100 MG tablet Take 1 tablet (100 mg total) by mouth daily. 05/02/14  Yes Deboraha Sprang, MD  Multiple Vitamins-Minerals (EYE VITAMINS PO) Take 1 tablet by mouth 2 (two) times daily.  Macular Supplement   Yes Historical Provider, MD  Multiple Vitamins-Minerals (MULTIVITAMIN GUMMIES ADULT PO) Take 1 tablet by mouth 2 (two) times daily.   Yes Historical Provider, MD  nitroGLYCERIN (NITROSTAT) 0.4 MG SL tablet Place 1 tablet (0.4 mg total) under the tongue every 5 (five) minutes as needed for chest pain. 12/23/14  Yes Deboraha Sprang, MD  pantoprazole (PROTONIX) 20 MG tablet Take 20 mg by mouth daily.   Yes Historical Provider, MD    Family History  Family History  Problem Relation Age of Onset  . Cancer Mother     ovarian  . Other Father     renal disease- grief over loss of spouse  . Cancer Brother     prostate-  died of hematologic disorder 2nd to chemo  . Diabetes Neg Hx   . Coronary artery disease Neg Hx   . Colon cancer Neg Hx   . Stomach cancer Neg Hx   . Rectal cancer Neg Hx   . Pulmonary fibrosis Brother     Social History  Social History   Social History  . Marital Status: Married    Spouse Name: N/A  . Number of Children: 3  . Years of Education: 16   Occupational History  . Land    Social History Main Topics  . Smoking status: Former Smoker -- 0.00 packs/day for 40 years    Types: Cigarettes, Cigars    Quit date: 09/18/2008  . Smokeless tobacco: Current User    Types: Chew     Comment: Has Occasional Cigar/ quit   . Alcohol Use: Yes  . Drug Use: No  . Sexual Activity: Not on file   Other Topics Concern  . Not on file   Social History Narrative   HSG,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 married 1961. 2 sons- '64, '63 , I daughter- '66, 4 grandchildren. work: Museum/gallery curator, retired but still consults. 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      No new stressors/      Review of Systems General:  No chills, fever, night sweats or weight changes.  Cardiovascular:  + chest pain, +dyspnea on exertion, no edema, orthopnea, palpitations,  paroxysmal nocturnal dyspnea. Dermatological: No rash, lesions/masses Respiratory: No cough, dyspnea Urologic: No hematuria, dysuria Abdominal:   No nausea, vomiting, diarrhea, bright red blood per rectum, + melena, no hematemesis Neurologic:  No visual changes, wkns, changes in mental status. All other systems reviewed and are otherwise negative except as noted above.  Physical Exam  Blood pressure 121/65, pulse 63, temperature 98.5 F (36.9 C), temperature source Oral, resp. rate 20, height 5\' 9"  (1.753 m), weight 216 lb 11.4 oz (98.3 kg), SpO2 98 %.  General: Pleasant, NAD Psych: Normal affect. Neuro: Alert and oriented X 3. Moves all extremities spontaneously. HEENT: Normal  Neck: Supple without bruits or JVD. Lungs:  Resp regular and unlabored, CTA. Heart: RRR 1/6 SM. Abdomen: Soft, non-tender, non-distended, BS + x 4.  Extremities: No clubbing, cyanosis or edema. DP/PT/Radials 2+ and equal bilaterally.  Labs  Troponin (Point of Care Test) No results for input(s): TROPIPOC in the last 72 hours.  Recent Labs  04/21/15 2248  TROPONINI 0.07*   Lab Results  Component Value Date   WBC 6.4 04/22/2015   HGB 6.4* 04/22/2015   HCT 20.8* 04/22/2015   MCV 87.8 04/22/2015   PLT 262 04/22/2015    Recent Labs Lab 04/22/15 0237  NA 139  K 3.8  CL 110  CO2 23  BUN 24*  CREATININE 1.17  CALCIUM 8.3*  GLUCOSE 91   Lab Results  Component Value Date   CHOL 121 01/20/2015   HDL 51.10 01/20/2015   LDLCALC 53 01/20/2015   TRIG 84.0 01/20/2015   No results found for: DDIMER   Radiology/Studies  Dg Chest Port 1 View  04/21/2015  CLINICAL DATA:  81 year old male with shortness of breath EXAM: PORTABLE CHEST 1 VIEW COMPARISON:  Chest radiograph dated 07/17/2012 and read radiograph dated 06/26/2014 FINDINGS: Single portable view of the chest does not demonstrate any focal consolidation. There is minimal bibasilar atelectatic changes. Trace fluid may be present in the right  minor fissure.  There is no pneumothorax. Stable cardiac silhouette. The osseous structures are grossly unremarkable. IMPRESSION: No focal consolidation. Electronically Signed   By: Anner Crete M.D.   On: 04/21/2015 23:01    ECG  NSR. No ischemia.     ASSESSMENT AND PLAN  1. Chest Pain: although patient had an intermediate risk stress test, showing with mild ischemia in the LCx/OM territory, suspect his recent CP and decreased exercise tolerance is likely secondary to demand ischemia from his anemia. In addition, he is not a candidate for cardiac catheterization at this time, due to anemia, as he would not be subtile for PCI given need for likely antiplatelet therapy. Will need GI consult and likely EGD and colonoscopy to assess for source of bleed. Recommend correcting anemia and try to maintain a hgb >8.0. We can reassess his symptoms once stable from a medical standpoint. If he continues to have symptoms with normalization of H/H, can re-consider ischemic testing to r/o possibility of obstructive CAD, once stable from a GI standpoint. However, suspect his symptoms will improve with treatment of his anemia. Continue statin for primary prevention. No ASA for now.   2. PAF: s/p ablation in 2015. NSR on telemetry. No symptoms of breakthrough afib/flutter. Eliquis on hold given anemia/ likely GIB.   3. Anemia: FOBT +. Hgb 6.4. In the setting of Eliquis and h/o gastritis. Recommend transfusion. IM to manage. GI consult pending. Eliquis discontinued.   4. Aortic Stenosis: Functionally bicuspid AV with mean gradient 10 mmHg at last echo in 10/15. Murmur does not suggest worsening at this time. Consider FU echo at some point in the next 6-12 mos.   5. Chronic Diastolic CHF: volume stable. Monitor volume status in between transfusions.   6. HTN: well controlled and stable.   7. HLD: continue statin.    Janee Morn, PA-C 04/22/2015, 9:21 AM Pager [phone]

## 2015-04-22 NOTE — Progress Notes (Signed)
Patient's home CPAP set up and examined for general safety.  Machine appears clean and without obvious defects.  Power cord without bare/exposed wires.  CPAP placed on patient.

## 2015-04-22 NOTE — Consult Note (Signed)
[facility] Consult: 12:44 PM 04/22/2015  LOS: 1 day    Referring Provider: Dr Allyson Sabal  Primary Care Physician:  [name], MD Primary Gastroenterologist:  Dr. Hilarie Fredrickson. Previously Dr Sharlett Iles.    Reason for Consultation:  anemia and heme + stool   HPI: Donald [name] is a 81 y.o. male.  On Eliquis for hx afib/flutter (last dose AM 12/6).  S/p cardiac ablation 03/2014 , cardioversion 08/2014.  CAD.  OSA on CPAP.  Chronic ETOH abuse.  CKD, stage 2.   Hx basal and squamous cell cancers. EF 30%.  S/p bil inguinal hernia repairs.   On Protonix 20 mg daily.  Tendency to loose stools.   03/2014 EGD: for gastric thickening on CT : traversable with resistance esoph stricture. Small HH.  Gastritis at cardia path: chronic active gastritis, no H Pylori.  05/2012 Flex Sig: for diarrhea.  Descending/sigmoid tics, grossly normal mucosa.  Random biopsies: Melanosis coli.  05/2009 Colonoscopy.  Screening study.  Severe descending/sigmoid tics.   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  4 weeks or so onset DOE (with minimal activity), diminished exercise tolerance.  Chest discomfort, radiating ot neck. with exertion.  Seen 12/5 by PA. Cardiology planned cardiac cath 04/29/2015.  Labs returned from 12/5: Hgb 7.  Had been 13 3 months ago. Sent to ED for admission, GI eval.  PT/INR 17.2/1.3.   Hgb droped 6.4 this AM.  S/p PRBC x 1 of 2 units.  BUN elevated to 26, but previously up to 30 in 08/2014 and 26 in 01/2015.   Pt has never seen black or bloody stools.  No altered bowel habits.  No n/v or pyrosis.  No new meds, no NSAIDs or ASA. Weight stable. Drinks Bourbon and water and 1 to 2 glasses wine daily.     Past Medical History    Diagnosis Date  . Hypertension   . Sinus bradycardia   . ED (erectile dysfunction)   . Arthropathy, unspecified, site unspecified   . Prostatitis, unspecified   . Allergic rhinitis due to pollen   . Diverticulosis     severe in descending, sigmoid colon.   . Skin cancer     basal and squamous cell  . Abnormal CT scan, stomach 02/2014    Thickening of gastric fundus and cardia.  gastritis on EGD 03/2014  . Atherosclerosis     a. Noted by abdominal CT 02/2014 (h/o normal nuc 2011).  . Habitual alcohol use   . OSA on CPAP   . Hyperlipidemia   . Obesity   . Atrial flutter ([place]) 02/24/14    s/p ablation 11/15  . GERD (gastroesophageal reflux disease) 03/2014    small HH and gastritis on EGD  . PAF (paroxysmal atrial fibrillation) ([place])   . Cardiomyopathy ([place])     tachy mediated - a. TEE (10/15):  EF 30%;  b. Echo after NSR restored (10/15):  mild LVH, EF 55-60%, mild AS, mild AI, mild MR, mild to  mod LAE, mild RAE  . Esophageal stricture 03/2014    traversable with endoscope. not dilated.     Past Surgical History  Procedure Laterality Date  . Inguinal hernia repair      right  . Inguinal hernia repair      left  . Tonsillectomy    . Orif fracture of the elbow    . Squamous cell carcinoma excision    . Skin cancer excision  05/21/12    Squamous cell ca  . Hernia repair    . Tee without cardioversion N/A 02/24/2014    Procedure: TRANSESOPHAGEAL ECHOCARDIOGRAM (TEE);  Surgeon: Lelon Perla, MD;  Location: [facility] ENDOSCOPY;  Service: Cardiovascular;  Laterality: N/A;  . Cardioversion N/A 02/24/2014    Procedure: CARDIOVERSION;  Surgeon: Lelon Perla, MD;  Location: [facility] ENDOSCOPY;  Service: Cardiovascular;  Laterality: N/A;  . Ablation  03/19/2014    RFCA of atrial flutter by Dr Caryl Comes  . Esophagogastroduodenoscopy N/A 04/09/2014    Procedure: ESOPHAGOGASTRODUODENOSCOPY (EGD);  Surgeon: Inda Castle, MD;  Location: [facility];  Service: Endoscopy;  Laterality: N/A;   . Atrial flutter ablation N/A 03/19/2014    Procedure: ATRIAL FLUTTER ABLATION;  Surgeon: Deboraha Sprang, MD;  Location: [facility] CATH LAB;  Service: Cardiovascular;  Laterality: N/A;  . Cardioversion Right 09/10/2014    Procedure: CARDIOVERSION;  Surgeon: Deboraha Sprang, MD;  Location: [facility] CATH LAB;  Service: Cardiovascular;  Laterality: Right;    Prior to Admission medications   Medication Sig Start Date End Date Taking? Authorizing Provider  apixaban (ELIQUIS) 5 MG TABS tablet Take 1 tablet (5 mg total) by mouth 2 (two) times daily. 11/04/14  Yes Deboraha Sprang, MD  atorvastatin (LIPITOR) 40 MG tablet TAKE 1 TABLET ONCE DAILY. 01/29/15  Yes Janith Lima, MD  diltiazem (DILACOR XR) 240 MG 24 hr capsule Take 240 mg by mouth daily as needed (increased heart rate).   Yes Historical Provider, MD  fluticasone (FLONASE) 50 MCG/ACT nasal spray Place 1 spray into both nostrils daily as needed for allergies or rhinitis. **SHAKE GENTLY**   Yes Historical Provider, MD  furosemide (LASIX) 40 MG tablet TAKE 1 TABLET ONCE DAILY. 02/26/15  Yes Janith Lima, MD  loratadine (CLARITIN) 10 MG tablet Take 10 mg by mouth daily as needed for allergies.    Yes Historical Provider, MD  losartan (COZAAR) 100 MG tablet Take 1 tablet (100 mg total) by mouth daily. 05/02/14  Yes Deboraha Sprang, MD  Multiple Vitamins-Minerals (EYE VITAMINS PO) Take 1 tablet by mouth 2 (two) times daily. Macular Supplement   Yes Historical Provider, MD  Multiple Vitamins-Minerals (MULTIVITAMIN GUMMIES ADULT PO) Take 1 tablet by mouth 2 (two) times daily.   Yes Historical Provider, MD  nitroGLYCERIN (NITROSTAT) 0.4 MG SL tablet Place 1 tablet (0.4 mg total) under the tongue every 5 (five) minutes as needed for chest pain. 12/23/14  Yes Deboraha Sprang, MD  pantoprazole (PROTONIX) 20 MG tablet Take 20 mg by mouth daily.   Yes Historical Provider, MD    Scheduled Meds: . atorvastatin  40 mg Oral Daily  . losartan  100 mg Oral Daily  . [START ON  04/23/2015] pantoprazole  40 mg Oral Q0600   Infusions: . sodium chloride Stopped (04/22/15 0527)   PRN Meds:    Allergies as of 04/21/2015  . (No Known Allergies)    Family History  Problem Relation Age of Onset  . Cancer Mother     ovarian  .  Other Father     renal disease- grief over loss of spouse  . Cancer Brother     prostate- died of hematologic disorder 2nd to chemo  . Diabetes Neg Hx   . Coronary artery disease Neg Hx   . Colon cancer Neg Hx   . Stomach cancer Neg Hx   . Rectal cancer Neg Hx   . Pulmonary fibrosis Brother     Social History   Social History  . Marital Status: Married    Spouse Name: N/A  . Number of Children: 3  . Years of Education: 16   Occupational History  . Land    Social History Main Topics  . Smoking status: Former Smoker -- 0.00 packs/day for 40 years    Types: Cigarettes, Cigars    Quit date: 09/18/2008  . Smokeless tobacco: Current User    Types: Chew     Comment: Has Occasional Cigar/ quit   . Alcohol Use: Yes  . Drug Use: No  . Sexual Activity: Not on file   Other Topics Concern  . 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 married 1961. 2 sons- '64, '63 , I daughter- '66, 4 grandchildren. work: Museum/gallery curator, retired but still consults. 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      No new stressors/     REVIEW OF SYSTEMS: Constitutional:  Weight up 15 # in last 6 months, as he was not exercising as much leading up tocardiac ablation. ENT:  No nose bleeds.  No dental pain.  Pulm:  SOB and DOE as per HPI CV:  No palpitations. Chest pressure as per HPI.  Slight ankle edema.  GU:  No hematuria, no frequency GI:  Per HPI.  No dysphagia Heme:  Never had issues with low blood counts.  No previous iron therapy.     Transfusions:  None before the last 24 hours Neuro:  No headaches, no  peripheral tingling or numbness Derm:  No itching, no rash or sores.  Endocrine:  No sweats or chills.  No polyuria or dysuria Immunization:  Up to date on fluvax.  Vaccinations reviewed.  Travel:  None beyond local counties in last few months.    PHYSICAL EXAM: Vital signs in last 24 hours: Filed Vitals:   04/22/15 1101 04/22/15 1128  BP: 135/65 126/65  Pulse: 72 71  Temp: 98.3 F (36.8 C) 98 F (36.7 C)  Resp: 20 20   Wt Readings from Last 3 Encounters:  04/22/15 98.3 kg (216 lb 11.4 oz)  04/21/15 100.971 kg (222 lb 9.6 oz)  04/13/15 102.059 kg (225 lb)   General: pleasant, looks younger than stated age.  Head:  No swelling or asymmetry  Eyes:  No scleral icterus, no pallor Ears:  nont HOH  Nose:  No congestion, no discharge Mouth:  Moist, clear, good dentition Neck:  No mass or JVD Lungs:  Clear bil.  No SOB.   Heart: RRR.  No mrg.  NSR on monitor Abdomen:  Soft, NT, ND.  No masses, organomegaly or hernias.   Rectal: deferred.  Stool in commode is large, soft, brown.     Musc/Skeltl: no joiint redness or deformity Extremities:  No CCE  Neurologic:  Oriented x 3.  Intelligent, alert.  No limb weakness.  No tremor.  No gross deficits.  Skin:  No telangectasia.  Keloid scar on  upper chest at previous skin cancer excision site Tattoos:  none Nodes:  No adenopathy.     Psych:  Pleasant, cooperative, relaxed.   Intake/Output from previous day: 12/06 0701 - 12/07 0700 In: 335 [Blood:335] Out: -  Intake/Output this shift: Total I/O In: 366 [Blood:366] Out: 651 [Urine:650; Stool:1]  LAB RESULTS:  Recent Labs  04/21/15 1003 04/21/15 2018 04/22/15 0237  WBC 7.2 7.3 6.4  HGB 7.0* 7.1* 6.4*  HCT 22.3* 22.8* 20.8*  PLT 330 305 262   BMET Lab Results  Component Value Date   NA 139 04/22/2015   NA 140 04/21/2015   NA 136 04/21/2015   K 3.8 04/22/2015   K 4.7 04/21/2015   K 4.5 04/21/2015   CL 110 04/22/2015   CL 109 04/21/2015   CL 105 04/21/2015   CO2 23  04/22/2015   CO2 22 04/21/2015   CO2 24 04/21/2015   GLUCOSE 91 04/22/2015   GLUCOSE 107* 04/21/2015   GLUCOSE 98 04/21/2015   BUN 24* 04/22/2015   BUN 26* 04/21/2015   BUN 26* 04/21/2015   CREATININE 1.17 04/22/2015   CREATININE 1.36* 04/21/2015   CREATININE 1.13* 04/21/2015   CALCIUM 8.3* 04/22/2015   CALCIUM 8.9 04/21/2015   CALCIUM 8.8 04/21/2015   LFT No results for input(s): PROT, ALBUMIN, AST, ALT, ALKPHOS, BILITOT, BILIDIR, IBILI in the last 72 hours. PT/INR Lab Results  Component Value Date   INR 1.29 04/21/2015   INR 1.39 04/21/2015   INR 1.19 04/08/2014   Hepatitis Panel No results for input(s): HEPBSAG, HCVAB, HEPAIGM, HEPBIGM in the last 72 hours. C-Diff No components found for: CDIFF Lipase     Component Value Date/Time   LIPASE 34.0 11/20/2013 1207    Drugs of Abuse  No results found for: LABOPIA, COCAINSCRNUR, LABBENZ, AMPHETMU, THCU, LABBARB   RADIOLOGY STUDIES: Dg Chest Port 1 View  04/21/2015  CLINICAL DATA:  81 year old male with shortness of breath EXAM: PORTABLE CHEST 1 VIEW COMPARISON:  Chest radiograph dated 07/17/2012 and read radiograph dated 06/26/2014 FINDINGS: Single portable view of the chest does not demonstrate any focal consolidation. There is minimal bibasilar atelectatic changes. Trace fluid may be present in the right minor fissure. There is no pneumothorax. Stable cardiac silhouette. The osseous structures are grossly unremarkable. IMPRESSION: No focal consolidation. Electronically Signed   By: Anner Crete M.D.   On: 04/21/2015 23:01    ENDOSCOPIC STUDIES: Per HPI  IMPRESSION:   *  Symptomatic normocytic anemia in pt on Eliquis.  FOBT + but no overt bleeding.  Rule out AVMs, ulcers, neoplasia.    *  Hx gastritis, on low dose maintenance Protonix.  Hx diverticulosis, melanosis coli.    *  Recent exertional angina, set for cath on 12/14 but pt feels he will cxl this, as his sxs are related to the anemia.    *  Daily ETOH,  somewhat heavy (at least 4 oz daily) but no evidence liver disease and highly functional   *  OSA on CPAP.    *  Chronic Eliquis on hold.  For hx a fib, s/p ablation and DCCV.  Currently in NSR.   PLAN:     *  EGD and colonoscopy, tomorrow.  Clears now.  Pt agreeable.   *  Stop PPI drip.  Start daily po PPI.    Azucena Freed  04/22/2015, 12:44 PM Pager: [phone]     Attending physician's note   I have taken a history, examined the patient and reviewed  the chart. I agree with the Advanced Practitioner's note, impression and recommendations. Normocytic anemia and occult blood in stools. Patient on Eliquis fo afib. Colonoscopy and EGD tomorrow which will be 2 days off Eliquis. The risks (including bleeding, perforation, infection, missed lesions, medication reactions and possible hospitalization or surgery if complications occur), benefits, and alternatives to colonoscopy with possible biopsy and possible polypectomy were discussed with the patient and they consent to proceed. The risks (including bleeding, perforation, infection, missed lesions, medication reactions and possible hospitalization or surgery if complications occur), benefits, and alternatives to endoscopy with possible biopsy and possible dilation were discussed with the patient and they consent to proceed.     Lucio Edward, MD [facility] [phone] Mon-Fri 8a-5p [phone] after 5p, weekends, holidays

## 2015-04-22 NOTE — Progress Notes (Signed)
[facility] PROGRESS NOTE  Donald [name] [id number] DOB: 1933/01/29 DOA: 04/21/2015 PCP: [name], MD  Length of stay: 1   Assessment/Plan: Principal Problem:   Anemia due to blood loss Active Problems:   Essential hypertension   OSA (obstructive sleep apnea)   PAF (paroxysmal atrial fibrillation) (HCC)   Gastritis and gastroduodenitis   UGIB (upper gastrointestinal bleed)    1. Blood loss anemia:  He has noted stools have been darker than normal. Source suspected upper, given known chronic active gastritis .  Patient followed by Dr. Hilarie Fredrickson, will consult [facility], hemoglobin dropped from 7.1-6.4 overnight -Hold apixaban -Consult to GI, appreciate recommendations regarding need for endoscopy or not Keep patient nothing by mouth Patient receiving 2 units of packed red blood cells   2. Angina:  This is new. Suspect this is acutely worsening because of anemia rather than unstable angina.  -Recommended transfusion to patient,   -Consult to Cardiology regarding transfusion threshold, and need for LHC given new finding   3. Paroxysmal A. fib: tachycardia mediated cardiomyopathy, functionally bicuspid aortic valve CHADS2-VASc 4. This corresponds to ~5-7% risk of TIA/stroke per year.  Hold apixaban  4. OSA:  Continue home CPAP  5. CKD stage III:  Stable.   6. Chronic diastolic CHF and HTN:  Stable. Euvolemic. Hold furosemide unless transfused Continue ARB  7.  History of EtOH abuse -INR 1.1  DVT prophylaxsis  eliquis   Code Status:      Code Status Orders        Start     Ordered   04/22/15 0024  Do not attempt resuscitation (DNR)   Continuous    Question Answer Comment  In the event of cardiac or respiratory ARREST Do not call a "code blue"   In the event of cardiac or respiratory ARREST Do not perform Intubation, CPR, defibrillation or ACLS   In the event of cardiac or respiratory ARREST Use medication by any  route, position, wound care, and other measures to relive pain and suffering. May use oxygen, suction and manual treatment of airway obstruction as needed for comfort.      04/22/15 0023    Advance Directive Documentation        Most Recent Value   Type of Advance Directive  Healthcare Power of Attorney, Living will [facility] [name]]   Pre-existing out of facility DNR order (yellow form or pink MOST form)     "MOST" Form in Place?        Family Communication: Discussed in detail with the patient, all imaging results, lab results explained to the patient   Disposition Plan:  As above    Brief narrative: 81 y.o. male with a past medical history significant for pAF s/p ablation on Eliquis, OSA on CPAP, HTN, and chronic diastolic CHF who presents with worsening exertional dyspnea, found at his Cardiologist to have new anemia.  The patient was in his usual state of health until about 3 weeks ago when he started to develop exertional intolerance and chest pressure. Gradually he has noticed more of this dyspnea and chest pressure while working out at [employer], working for [employer], volunteering putting back books at school and so he has had to limit these activities.The chest discomfort he describes as a pressure that radiates into the throat and is similar to anginal pain he has had with A. fib in the past.  For these reasons he went to see his cardiologist's office today, who  recommended cardiac catheterization within the next 2 weeks (given a NM stress this past summer showing reversible defect). On routine preprocedure blood work the patient was noted to have hemoglobin of 7 g/dL so he was called and referred to the ER.  In the ED, he was FOBT positive but denied frank bleeding and was hemodynamically stable. TRH were asked to admit for anemia and GI bleeding  Consultants:  Gastroenterology  Procedures:  None  Antibiotics: Anti-infectives    None         HPI/Subjective: Dark  stools for 2-3 weeks, LH for 2 weeks   Objective: Filed Vitals:   04/22/15 0017 04/22/15 0523 04/22/15 0600 04/22/15 0848  BP: 133/69 120/60 123/59 121/65  Pulse: 72 74 70 63  Temp: 97.6 F (36.4 C) 98 F (36.7 C) 98.4 F (36.9 C) 98.5 F (36.9 C)  TempSrc: Oral Oral Oral Oral  Resp: 20 20 20 20   Height: 5\' 9"  (1.753 m)     Weight: 98.3 kg (216 lb 11.4 oz)     SpO2: 100% 100% 98% 98%    Intake/Output Summary (Last 24 hours) at 04/22/15 0955 Last data filed at 04/22/15 0848  Gross per 24 hour  Intake    701 ml  Output      0 ml  Net    701 ml    Exam:  General appearance: Well-developed, older adult male, alert and in no acute distress.  Eyes: Anicteric, lids and lashes normal.  ENT: No nasal deformity, discharge, or epistaxis. OP moist without lesions.  Skin: Warm and dry. Mild pallor. No flank bruising. Cardiac: RRR, nl S1-S2, 1/6 SEM at LUSB. Capillary refill is brisk. 1+ pretibial LE edema. Radial pulses 2+ and symmetric. No carotid bruits. Respiratory: Normal respiratory rate and rhythm. CTAB without rales or wheezes. Abdomen: Abdomen soft without rigidity. No TTP.No ascites, distension.  MSK: No deformities or effusions. Neuro: Sensorium intact and responding to questions, attention normal. Speech is fluent. Moves all extremities equally and with normal coordination.    Data Review   Micro Results No results found for this or any previous visit (from the past 240 hour(s)).  Radiology Reports Dg Chest Port 1 View  04/21/2015  CLINICAL DATA:  81 year old male with shortness of breath EXAM: PORTABLE CHEST 1 VIEW COMPARISON:  Chest radiograph dated 07/17/2012 and read radiograph dated 06/26/2014 FINDINGS: Single portable view of the chest does not demonstrate any focal consolidation. There is minimal bibasilar atelectatic changes. Trace fluid may be present in the right minor fissure. There is no pneumothorax. Stable cardiac silhouette. The  osseous structures are grossly unremarkable. IMPRESSION: No focal consolidation. Electronically Signed   By: Anner Crete M.D.   On: 04/21/2015 23:01     CBC  Recent Labs Lab 04/21/15 1003 04/21/15 2018 04/22/15 0237  WBC 7.2 7.3 6.4  HGB 7.0* 7.1* 6.4*  HCT 22.3* 22.8* 20.8*  PLT 330 305 262  MCV 86.4 87.7 87.8  MCH 27.1 27.3 26.6  MCHC 31.4 31.1 30.3  RDW 14.4 13.8 13.9  LYMPHSABS 1.7 2.0  --   MONOABS 0.9 0.5  --   EOSABS 0.4 0.3  --   BASOSABS 0.1 0.1  --     Chemistries   Recent Labs Lab 04/21/15 1003 04/21/15 2018 04/22/15 0237  NA 136 140 139  K 4.5 4.7 3.8  CL 105 109 110  CO2 24 22 23   GLUCOSE 98 107* 91  BUN 26* 26* 24*  CREATININE 1.13* 1.36* 1.17  CALCIUM 8.8 8.9 8.3*  MG  --   --  2.0   ------------------------------------------------------------------------------------------------------------------ estimated creatinine clearance is 58.2 mL/min (by C-G formula based on Cr of 1.17). ------------------------------------------------------------------------------------------------------------------ No results for input(s): HGBA1C in the last 72 hours. ------------------------------------------------------------------------------------------------------------------ No results for input(s): CHOL, HDL, LDLCALC, TRIG, CHOLHDL, LDLDIRECT in the last 72 hours. ------------------------------------------------------------------------------------------------------------------ No results for input(s): TSH, T4TOTAL, T3FREE, THYROIDAB in the last 72 hours.  Invalid input(s): FREET3 ------------------------------------------------------------------------------------------------------------------ No results for input(s): VITAMINB12, FOLATE, FERRITIN, TIBC, IRON, RETICCTPCT in the last 72 hours.  Coagulation profile  Recent Labs Lab 04/21/15 1003 04/21/15 2018  INR 1.39 1.29    No results for input(s): DDIMER in the last 72 hours.  Cardiac  Enzymes  Recent Labs Lab 04/21/15 2248  TROPONINI 0.07*   ------------------------------------------------------------------------------------------------------------------ Invalid input(s): POCBNP   CBG: No results for input(s): GLUCAP in the last 168 hours.     Studies: Dg Chest Port 1 View  04/21/2015  CLINICAL DATA:  81 year old male with shortness of breath EXAM: PORTABLE CHEST 1 VIEW COMPARISON:  Chest radiograph dated 07/17/2012 and read radiograph dated 06/26/2014 FINDINGS: Single portable view of the chest does not demonstrate any focal consolidation. There is minimal bibasilar atelectatic changes. Trace fluid may be present in the right minor fissure. There is no pneumothorax. Stable cardiac silhouette. The osseous structures are grossly unremarkable. IMPRESSION: No focal consolidation. Electronically Signed   By: Anner Crete M.D.   On: 04/21/2015 23:01      No results found for: HGBA1C Lab Results  Component Value Date   LDLCALC 53 01/20/2015   CREATININE 1.17 04/22/2015       Scheduled Meds: . atorvastatin  40 mg Oral Daily  . losartan  100 mg Oral Daily  . pantoprazole (PROTONIX) IV  80 mg Intravenous Q12H   Continuous Infusions: . sodium chloride Stopped (04/22/15 0527)    Principal Problem:   Anemia due to blood loss Active Problems:   Essential hypertension   OSA (obstructive sleep apnea)   PAF (paroxysmal atrial fibrillation) (HCC)   Gastritis and gastroduodenitis   UGIB (upper gastrointestinal bleed)    Time spent: 45 minutes   [facility] Pager [phone]. If 7PM-7AM, please contact night-coverage at [email or web address], password [facility] 04/22/2015, 9:55 AM  LOS: 1 day

## 2015-04-23 ENCOUNTER — Encounter (HOSPITAL_COMMUNITY): Payer: Self-pay | Admitting: *Deleted

## 2015-04-23 ENCOUNTER — Encounter (HOSPITAL_COMMUNITY)
Admission: EM | Disposition: A | Discharge status: Home / Self Care | Payer: Self-pay | Source: Home / Self Care | Attending: Internal Medicine

## 2015-04-23 DIAGNOSIS — D124 Benign neoplasm of descending colon: Secondary | ICD-10-CM

## 2015-04-23 DIAGNOSIS — Q2733 Arteriovenous malformation of digestive system vessel: Secondary | ICD-10-CM

## 2015-04-23 HISTORY — PX: ESOPHAGOGASTRODUODENOSCOPY: SHX5428

## 2015-04-23 HISTORY — PX: COLONOSCOPY: SHX5424

## 2015-04-23 LAB — CBC
HEMATOCRIT: 28.6 % — AB (ref 39.0–52.0)
Hemoglobin: 8.9 g/dL — ABNORMAL LOW (ref 13.0–17.0)
MCH: 27.1 pg (ref 26.0–34.0)
MCHC: 31.1 g/dL (ref 30.0–36.0)
MCV: 87.2 fL (ref 78.0–100.0)
PLATELETS: 304 10*3/uL (ref 150–400)
RBC: 3.28 MIL/uL — ABNORMAL LOW (ref 4.22–5.81)
RDW: 13.9 % (ref 11.5–15.5)
WBC: 8.4 10*3/uL (ref 4.0–10.5)

## 2015-04-23 LAB — IRON AND TIBC
IRON: 13 ug/dL — AB (ref 45–182)
SATURATION RATIOS: 3 % — AB (ref 17.9–39.5)
TIBC: 386 ug/dL (ref 250–450)
UIBC: 373 ug/dL

## 2015-04-23 LAB — TROPONIN I: Troponin I: 0.07 ng/mL — ABNORMAL HIGH (ref <0.031)

## 2015-04-23 LAB — COMPREHENSIVE METABOLIC PANEL
ALT: 19 U/L (ref 17–63)
ANION GAP: 7 (ref 5–15)
AST: 22 U/L (ref 15–41)
Albumin: 3.4 g/dL — ABNORMAL LOW (ref 3.5–5.0)
Alkaline Phosphatase: 67 U/L (ref 38–126)
BILIRUBIN TOTAL: 1.2 mg/dL (ref 0.3–1.2)
BUN: 13 mg/dL (ref 6–20)
CHLORIDE: 111 mmol/L (ref 101–111)
CO2: 24 mmol/L (ref 22–32)
Calcium: 8.9 mg/dL (ref 8.9–10.3)
Creatinine, Ser: 1.02 mg/dL (ref 0.61–1.24)
GFR calc Af Amer: >60 mL/min (ref >60)
Glucose, Bld: 97 mg/dL (ref 65–99)
POTASSIUM: 4.5 mmol/L (ref 3.5–5.1)
Sodium: 142 mmol/L (ref 135–145)
TOTAL PROTEIN: 6.7 g/dL (ref 6.5–8.1)

## 2015-04-23 LAB — FERRITIN: FERRITIN: 11 ng/mL — AB (ref 24–336)

## 2015-04-23 SURGERY — COLONOSCOPY
Anesthesia: Moderate Sedation

## 2015-04-23 MED ORDER — DIPHENHYDRAMINE HCL 50 MG/ML IJ SOLN
INTRAMUSCULAR | Status: AC
  Filled 2015-04-23: qty 1

## 2015-04-23 MED ORDER — MIDAZOLAM HCL 10 MG/2ML IJ SOLN
INTRAMUSCULAR | Status: DC | PRN
  Administered 2015-04-23 (×3): 2 mg via INTRAVENOUS
  Administered 2015-04-23: 1 mg via INTRAVENOUS

## 2015-04-23 MED ORDER — FENTANYL CITRATE (PF) 100 MCG/2ML IJ SOLN
INTRAMUSCULAR | Status: DC | PRN
  Administered 2015-04-23 (×4): 25 ug via INTRAVENOUS

## 2015-04-23 MED ORDER — DIPHENHYDRAMINE HCL 50 MG/ML IJ SOLN
INTRAMUSCULAR | Status: DC | PRN
  Administered 2015-04-23 (×2): 25 mg via INTRAVENOUS

## 2015-04-23 MED ORDER — BUTAMBEN-TETRACAINE-BENZOCAINE 2-2-14 % EX AERO
INHALATION_SPRAY | CUTANEOUS | Status: DC | PRN
  Administered 2015-04-23: 1 via TOPICAL

## 2015-04-23 MED ORDER — MIDAZOLAM HCL 5 MG/ML IJ SOLN
INTRAMUSCULAR | Status: AC
  Filled 2015-04-23: qty 2

## 2015-04-23 MED ORDER — FENTANYL CITRATE (PF) 100 MCG/2ML IJ SOLN
INTRAMUSCULAR | Status: AC
  Filled 2015-04-23: qty 2

## 2015-04-23 MED ORDER — MIDAZOLAM HCL 5 MG/5ML IJ SOLN
INTRAMUSCULAR | Status: DC | PRN
  Administered 2015-04-23: 1 mg via INTRAVENOUS

## 2015-04-23 NOTE — Plan of Care (Signed)
Problem: Education: Goal: Knowledge of [facility] Education information/materials will improve Outcome: Progressing Pt had endoscopy and colonoscopy done this morning. He tolerated well.. He has been able to tolerate full liquid diet since then. No complaints of chest pain or abdominal pain.

## 2015-04-23 NOTE — Progress Notes (Signed)
Pt has home CPAP unit and prefers self placement.  RT to monitor and assess as needed.

## 2015-04-23 NOTE — H&P (View-Only) (Signed)
[facility] Consult: 12:44 PM 04/22/2015  LOS: 1 day    Referring Provider: Dr Allyson Sabal  Primary Care Physician:  [name], MD Primary Gastroenterologist:  Dr. Hilarie Fredrickson. Previously Dr Sharlett Iles.    Reason for Consultation:  anemia and heme + stool   HPI: Donald [name] is a 81 y.o. male.  On Eliquis for hx afib/flutter (last dose AM 12/6).  S/p cardiac ablation 03/2014 , cardioversion 08/2014.  CAD.  OSA on CPAP.  Chronic ETOH abuse.  CKD, stage 2.   Hx basal and squamous cell cancers. EF 30%.  S/p bil inguinal hernia repairs.   On Protonix 20 mg daily.  Tendency to loose stools.   03/2014 EGD: for gastric thickening on CT : traversable with resistance esoph stricture. Small HH.  Gastritis at cardia path: chronic active gastritis, no H Pylori.  05/2012 Flex Sig: for diarrhea.  Descending/sigmoid tics, grossly normal mucosa.  Random biopsies: Melanosis coli.  05/2009 Colonoscopy.  Screening study.  Severe descending/sigmoid tics.   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  4 weeks or so onset DOE (with minimal activity), diminished exercise tolerance.  Chest discomfort, radiating ot neck. with exertion.  Seen 12/5 by PA. Cardiology planned cardiac cath 04/29/2015.  Labs returned from 12/5: Hgb 7.  Had been 13 3 months ago. Sent to ED for admission, GI eval.  PT/INR 17.2/1.3.   Hgb droped 6.4 this AM.  S/p PRBC x 1 of 2 units.  BUN elevated to 26, but previously up to 30 in 08/2014 and 26 in 01/2015.   Pt has never seen black or bloody stools.  No altered bowel habits.  No n/v or pyrosis.  No new meds, no NSAIDs or ASA. Weight stable. Drinks Bourbon and water and 1 to 2 glasses wine daily.     Past Medical History    Diagnosis Date  . Hypertension   . Sinus bradycardia   . ED (erectile dysfunction)   . Arthropathy, unspecified, site unspecified   . Prostatitis, unspecified   . Allergic rhinitis due to pollen   . Diverticulosis     severe in descending, sigmoid colon.   . Skin cancer     basal and squamous cell  . Abnormal CT scan, stomach 02/2014    Thickening of gastric fundus and cardia.  gastritis on EGD 03/2014  . Atherosclerosis     a. Noted by abdominal CT 02/2014 (h/o normal nuc 2011).  . Habitual alcohol use   . OSA on CPAP   . Hyperlipidemia   . Obesity   . Atrial flutter ([place]) 02/24/14    s/p ablation 11/15  . GERD (gastroesophageal reflux disease) 03/2014    small HH and gastritis on EGD  . PAF (paroxysmal atrial fibrillation) ([place])   . Cardiomyopathy ([place])     tachy mediated - a. TEE (10/15):  EF 30%;  b. Echo after NSR restored (10/15):  mild LVH, EF 55-60%, mild AS, mild AI, mild MR, mild to  mod LAE, mild RAE  . Esophageal stricture 03/2014    traversable with endoscope. not dilated.     Past Surgical History  Procedure Laterality Date  . Inguinal hernia repair      right  . Inguinal hernia repair      left  . Tonsillectomy    . Orif fracture of the elbow    . Squamous cell carcinoma excision    . Skin cancer excision  05/21/12    Squamous cell ca  . Hernia repair    . Tee without cardioversion N/A 02/24/2014    Procedure: TRANSESOPHAGEAL ECHOCARDIOGRAM (TEE);  Surgeon: Lelon Perla, MD;  Location: [facility] ENDOSCOPY;  Service: Cardiovascular;  Laterality: N/A;  . Cardioversion N/A 02/24/2014    Procedure: CARDIOVERSION;  Surgeon: Lelon Perla, MD;  Location: [facility] ENDOSCOPY;  Service: Cardiovascular;  Laterality: N/A;  . Ablation  03/19/2014    RFCA of atrial flutter by Dr Caryl Comes  . Esophagogastroduodenoscopy N/A 04/09/2014    Procedure: ESOPHAGOGASTRODUODENOSCOPY (EGD);  Surgeon: Inda Castle, MD;  Location: [place];  Service: Endoscopy;  Laterality: N/A;   . Atrial flutter ablation N/A 03/19/2014    Procedure: ATRIAL FLUTTER ABLATION;  Surgeon: Deboraha Sprang, MD;  Location: [facility] CATH LAB;  Service: Cardiovascular;  Laterality: N/A;  . Cardioversion Right 09/10/2014    Procedure: CARDIOVERSION;  Surgeon: Deboraha Sprang, MD;  Location: [facility] CATH LAB;  Service: Cardiovascular;  Laterality: Right;    Prior to Admission medications   Medication Sig Start Date End Date Taking? Authorizing Provider  apixaban (ELIQUIS) 5 MG TABS tablet Take 1 tablet (5 mg total) by mouth 2 (two) times daily. 11/04/14  Yes Deboraha Sprang, MD  atorvastatin (LIPITOR) 40 MG tablet TAKE 1 TABLET ONCE DAILY. 01/29/15  Yes Janith Lima, MD  diltiazem (DILACOR XR) 240 MG 24 hr capsule Take 240 mg by mouth daily as needed (increased heart rate).   Yes Historical Provider, MD  fluticasone (FLONASE) 50 MCG/ACT nasal spray Place 1 spray into both nostrils daily as needed for allergies or rhinitis. **SHAKE GENTLY**   Yes Historical Provider, MD  furosemide (LASIX) 40 MG tablet TAKE 1 TABLET ONCE DAILY. 02/26/15  Yes Janith Lima, MD  loratadine (CLARITIN) 10 MG tablet Take 10 mg by mouth daily as needed for allergies.    Yes Historical Provider, MD  losartan (COZAAR) 100 MG tablet Take 1 tablet (100 mg total) by mouth daily. 05/02/14  Yes Deboraha Sprang, MD  Multiple Vitamins-Minerals (EYE VITAMINS PO) Take 1 tablet by mouth 2 (two) times daily. Macular Supplement   Yes Historical Provider, MD  Multiple Vitamins-Minerals (MULTIVITAMIN GUMMIES ADULT PO) Take 1 tablet by mouth 2 (two) times daily.   Yes Historical Provider, MD  nitroGLYCERIN (NITROSTAT) 0.4 MG SL tablet Place 1 tablet (0.4 mg total) under the tongue every 5 (five) minutes as needed for chest pain. 12/23/14  Yes Deboraha Sprang, MD  pantoprazole (PROTONIX) 20 MG tablet Take 20 mg by mouth daily.   Yes Historical Provider, MD    Scheduled Meds: . atorvastatin  40 mg Oral Daily  . losartan  100 mg Oral Daily  . [START ON  04/23/2015] pantoprazole  40 mg Oral Q0600   Infusions: . sodium chloride Stopped (04/22/15 0527)   PRN Meds:    Allergies as of 04/21/2015  . (No Known Allergies)    Family History  Problem Relation Age of Onset  . Cancer Mother     ovarian  .  Other Father     renal disease- grief over loss of spouse  . Cancer Brother     prostate- died of hematologic disorder 2nd to chemo  . Diabetes Neg Hx   . Coronary artery disease Neg Hx   . Colon cancer Neg Hx   . Stomach cancer Neg Hx   . Rectal cancer Neg Hx   . Pulmonary fibrosis Brother     Social History   Social History  . Marital Status: Married    Spouse Name: N/A  . Number of Children: 3  . Years of Education: 16   Occupational History  . Land    Social History Main Topics  . Smoking status: Former Smoker -- 0.00 packs/day for 40 years    Types: Cigarettes, Cigars    Quit date: 09/18/2008  . Smokeless tobacco: Current User    Types: Chew     Comment: Has Occasional Cigar/ quit   . Alcohol Use: Yes  . Drug Use: No  . Sexual Activity: Not on file   Other Topics Concern  . 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 married 1961. 2 sons- '64, '63 , I daughter- '66, 4 grandchildren. work: Museum/gallery curator, retired but still consults. 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      No new stressors/     REVIEW OF SYSTEMS: Constitutional:  Weight up 15 # in last 6 months, as he was not exercising as much leading up tocardiac ablation. ENT:  No nose bleeds.  No dental pain.  Pulm:  SOB and DOE as per HPI CV:  No palpitations. Chest pressure as per HPI.  Slight ankle edema.  GU:  No hematuria, no frequency GI:  Per HPI.  No dysphagia Heme:  Never had issues with low blood counts.  No previous iron therapy.     Transfusions:  None before the last 24 hours Neuro:  No headaches, no  peripheral tingling or numbness Derm:  No itching, no rash or sores.  Endocrine:  No sweats or chills.  No polyuria or dysuria Immunization:  Up to date on fluvax.  Vaccinations reviewed.  Travel:  None beyond local counties in last few months.    PHYSICAL EXAM: Vital signs in last 24 hours: Filed Vitals:   04/22/15 1101 04/22/15 1128  BP: 135/65 126/65  Pulse: 72 71  Temp: 98.3 F (36.8 C) 98 F (36.7 C)  Resp: 20 20   Wt Readings from Last 3 Encounters:  04/22/15 98.3 kg (216 lb 11.4 oz)  04/21/15 100.971 kg (222 lb 9.6 oz)  04/13/15 102.059 kg (225 lb)   General: pleasant, looks younger than stated age.  Head:  No swelling or asymmetry  Eyes:  No scleral icterus, no pallor Ears:  nont HOH  Nose:  No congestion, no discharge Mouth:  Moist, clear, good dentition Neck:  No mass or JVD Lungs:  Clear bil.  No SOB.   Heart: RRR.  No mrg.  NSR on monitor Abdomen:  Soft, NT, ND.  No masses, organomegaly or hernias.   Rectal: deferred.  Stool in commode is large, soft, brown.     Musc/Skeltl: no joiint redness or deformity Extremities:  No CCE  Neurologic:  Oriented x 3.  Intelligent, alert.  No limb weakness.  No tremor.  No gross deficits.  Skin:  No telangectasia.  Keloid scar on  upper chest at previous skin cancer excision site Tattoos:  none Nodes:  No adenopathy.     Psych:  Pleasant, cooperative, relaxed.   Intake/Output from previous day: 12/06 0701 - 12/07 0700 In: 335 [Blood:335] Out: -  Intake/Output this shift: Total I/O In: 366 [Blood:366] Out: 651 [Urine:650; Stool:1]  LAB RESULTS:  Recent Labs  04/21/15 1003 04/21/15 2018 04/22/15 0237  WBC 7.2 7.3 6.4  HGB 7.0* 7.1* 6.4*  HCT 22.3* 22.8* 20.8*  PLT 330 305 262   BMET Lab Results  Component Value Date   NA 139 04/22/2015   NA 140 04/21/2015   NA 136 04/21/2015   K 3.8 04/22/2015   K 4.7 04/21/2015   K 4.5 04/21/2015   CL 110 04/22/2015   CL 109 04/21/2015   CL 105 04/21/2015   CO2 23  04/22/2015   CO2 22 04/21/2015   CO2 24 04/21/2015   GLUCOSE 91 04/22/2015   GLUCOSE 107* 04/21/2015   GLUCOSE 98 04/21/2015   BUN 24* 04/22/2015   BUN 26* 04/21/2015   BUN 26* 04/21/2015   CREATININE 1.17 04/22/2015   CREATININE 1.36* 04/21/2015   CREATININE 1.13* 04/21/2015   CALCIUM 8.3* 04/22/2015   CALCIUM 8.9 04/21/2015   CALCIUM 8.8 04/21/2015   LFT No results for input(s): PROT, ALBUMIN, AST, ALT, ALKPHOS, BILITOT, BILIDIR, IBILI in the last 72 hours. PT/INR Lab Results  Component Value Date   INR 1.29 04/21/2015   INR 1.39 04/21/2015   INR 1.19 04/08/2014   Hepatitis Panel No results for input(s): HEPBSAG, HCVAB, HEPAIGM, HEPBIGM in the last 72 hours. C-Diff No components found for: CDIFF Lipase     Component Value Date/Time   LIPASE 34.0 11/20/2013 1207    Drugs of Abuse  No results found for: LABOPIA, COCAINSCRNUR, LABBENZ, AMPHETMU, THCU, LABBARB   RADIOLOGY STUDIES: Dg Chest Port 1 View  04/21/2015  CLINICAL DATA:  81 year old male with shortness of breath EXAM: PORTABLE CHEST 1 VIEW COMPARISON:  Chest radiograph dated 07/17/2012 and read radiograph dated 06/26/2014 FINDINGS: Single portable view of the chest does not demonstrate any focal consolidation. There is minimal bibasilar atelectatic changes. Trace fluid may be present in the right minor fissure. There is no pneumothorax. Stable cardiac silhouette. The osseous structures are grossly unremarkable. IMPRESSION: No focal consolidation. Electronically Signed   By: Anner Crete M.D.   On: 04/21/2015 23:01    ENDOSCOPIC STUDIES: Per HPI  IMPRESSION:   *  Symptomatic normocytic anemia in pt on Eliquis.  FOBT + but no overt bleeding.  Rule out AVMs, ulcers, neoplasia.    *  Hx gastritis, on low dose maintenance Protonix.  Hx diverticulosis, melanosis coli.    *  Recent exertional angina, set for cath on 12/14 but pt feels he will cxl this, as his sxs are related to the anemia.    *  Daily ETOH,  somewhat heavy (at least 4 oz daily) but no evidence liver disease and highly functional   *  OSA on CPAP.    *  Chronic Eliquis on hold.  For hx a fib, s/p ablation and DCCV.  Currently in NSR.   PLAN:     *  EGD and colonoscopy, tomorrow.  Clears now.  Pt agreeable.   *  Stop PPI drip.  Start daily po PPI.    Azucena Freed  04/22/2015, 12:44 PM Pager: [phone]     Attending physician's note   I have taken a history, examined the patient and reviewed  the chart. I agree with the Advanced Practitioner's note, impression and recommendations. Normocytic anemia and occult blood in stools. Patient on Eliquis fo afib. Colonoscopy and EGD tomorrow which will be 2 days off Eliquis. The risks (including bleeding, perforation, infection, missed lesions, medication reactions and possible hospitalization or surgery if complications occur), benefits, and alternatives to colonoscopy with possible biopsy and possible polypectomy were discussed with the patient and they consent to proceed. The risks (including bleeding, perforation, infection, missed lesions, medication reactions and possible hospitalization or surgery if complications occur), benefits, and alternatives to endoscopy with possible biopsy and possible dilation were discussed with the patient and they consent to proceed.     Lucio Edward, MD [facility] [phone] Mon-Fri 8a-5p [phone] after 5p, weekends, holidays

## 2015-04-23 NOTE — Op Note (Addendum)
[facility] [place], [address]   COLONOSCOPY PROCEDURE REPORT PATIENT: Donald [name], Donald [name]  MR#: [id number] BIRTHDATE: February 03, 1933 , 82  yrs. old GENDER: male ENDOSCOPIST: Ladene Artist, MD, [facility] REFERRED BY: [facility] PROCEDURE DATE:  04/23/2015 PROCEDURE:   Colonoscopy, diagnostic and Colonoscopy with snare polypectomy First Screening Colonoscopy - Avg.  risk and is 50 yrs.  old or older - No.  Prior Negative Screening - Now for repeat screening. N/A  History of Adenoma - Now for follow-up colonoscopy & has been > or = to 3 yrs.  N/A  Polyps removed today? Yes ASA CLASS:   Class III INDICATIONS:Evaluation of unexplained GI bleeding, heme-positive stool, and anemia, non-specific. MEDICATIONS: Benadryl 50 mg IV, Fentanyl 100 mcg IV, and Versed 7 mg IV DESCRIPTION OF PROCEDURE:   After the risks benefits and alternatives of the procedure were thoroughly explained, informed consent was obtained.  The digital rectal exam revealed no abnormalities of the rectum.   The Pentax Ped Colon [id number] endoscope was introduced through the anus and advanced to the hepatic flexure. No adverse events experienced.   Limited by a tortuous and redundant colon.   Limited by patient discomfort. The quality of the prep was good.  (MiraLax was used)  The instrument was then slowly withdrawn as the colon was fully examined. Estimated blood loss is zero unless otherwise noted in this procedure report.  COLON FINDINGS: A semi-pedunculated polyp measuring 10 mm in size was found in the descending colon.  A polypectomy was performed with a cold snare.  The resection was complete, the polyp tissue was completely retrieved and sent to histology.   There was moderate diverticulosis noted in the sigmoid colon.   The colonic mucosa appeared normal at the splenic flexure, in the rectum, at the hepatic flexure, and in the transverse colon.  Retroflexed views  revealed internal Grade II hemorrhoids and Retroflexed views revealed external hemorrhoids. The time to cecum = 13.2 Withdrawal time = 12.0   The scope was withdrawn and the procedure completed. COMPLICATIONS: There were no immediate complications.  ENDOSCOPIC IMPRESSION: 1.   Semi-pedunculated polyp in the descending colon; polypectomy performed with a cold snare 2.   Moderate diverticulosis in the sigmoid colon 3.   The colonic mucosa otherwise appeared normal to the hepatic flexure 4.    Grade I internal hemorrhoids and external hemorrhoids  RECOMMENDATIONS: 1.  Await pathology results 2.  High fiber diet with liberal fluid intake. 3.  Upper endoscopy today 4.  Avoid ASA/NSAIDs for at least 2 weeks 5.  Outpatient follow up with Dr. Pyrtle-consider repeat colonoscopy with MAC or ACBE after 4 weeks to allow polypectomy site healing  eSigned:  Ladene Artist, MD, [facility] 04/23/2015 10:54 AM

## 2015-04-23 NOTE — Progress Notes (Signed)
[facility] PROGRESS NOTE  Donald [name] [id number] DOB: Jun 16, 1932 DOA: 04/21/2015 PCP: [name], MD  Length of stay: 2   Assessment/Plan: Principal Problem:   Anemia due to blood loss Active Problems:   Essential hypertension   OSA (obstructive sleep apnea)   PAF (paroxysmal atrial fibrillation) (HCC)   Gastritis and gastroduodenitis   UGIB (upper gastrointestinal bleed)   Anticoagulant long-term use   Guaiac positive stools    1. Blood loss anemia:  He has noted stools have been darker than normal.  hemoglobin dropped from 7.1-6.4 overnight, status post transfusion of 2 units of packed red blood cells, hemoglobin now 8.9. 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 Internal and external hemorrhoids GI recommends  Outpatient follow up with Dr. Pyrtle-consider repeat colonoscopy with MAC or ACBE after 4 weeks to allow polypectomy site healing -Consult to GI, appreciate recommendations regarding need for endoscopy or not  resumed full liquid diet, monitor CBC   2. Angina:  This is new. Suspect this is acutely worsening because of anemia rather than unstable angina.  -Recommended transfusion to patient,Cardiology to make further recommendations about  Ischemic workup. Situation complicated by severe anemia requiring transfusion. GI recommends to resume eliquis  in 2 days Repeat troponin , only one since admission,echo to r/o WM abnl  No ischemic workup until hemoglobin greater than 9.0.  3. Paroxysmal A. fib: tachycardia mediated cardiomyopathy, functionally bicuspid aortic valve CHADS2-VASc 4. This corresponds to ~5-7% risk of TIA/stroke per year.  Hold apixaban 2 more days  4. OSA:  Continue home  CPAP  5. CKD stage III:  Stable. , Baseline creatinine around 1.3.  6. Chronic diastolic CHF and HTN:  Stable. Euvolemic. Hold furosemide unless transfused Continue ARB  7.  History of EtOH abuse -INR 1.1  DVT prophylaxsis  holding  eliquis  given GI bleeding   Code Status:      Code Status Orders        Start     Ordered   04/22/15 0024  Do not attempt resuscitation (DNR)   Continuous    Question Answer Comment  In the event of cardiac or respiratory ARREST Do not call a "code blue"   In the event of cardiac or respiratory ARREST Do not perform Intubation, CPR, defibrillation or ACLS   In the event of cardiac or respiratory ARREST Use medication by any route, position, wound care, and other measures to relive pain and suffering. May use oxygen, suction and manual treatment of airway obstruction as needed for comfort.      04/22/15 0023    Advance Directive Documentation        Most Recent Value   Type of Advance Directive  Healthcare Power of Attorney, Living will [facility] [name]]   Pre-existing out of facility DNR order (yellow form or pink MOST form)     "MOST" Form in Place?        Family Communication: Discussed in detail with the patient, all imaging results, lab results explained to the patient   Disposition Plan:  As above    Brief narrative: 81 y.o. male with a past medical history significant for pAF s/p ablation on Eliquis, OSA on CPAP, HTN, and chronic diastolic CHF who presents with  worsening exertional dyspnea, found at his Cardiologist to have new anemia.  The patient was in his usual state of health until about 3 weeks ago when he started to develop exertional intolerance and chest pressure. Gradually he has noticed more of this dyspnea and chest pressure while working out at [employer], working for [employer], volunteering putting back books at school and so he has had to limit these activities.The chest discomfort he describes as a pressure that radiates  into the throat and is similar to anginal pain he has had with A. fib in the past.  For these reasons he went to see his cardiologist's office today, who recommended cardiac catheterization within the next 2 weeks (given a NM stress this past summer showing reversible defect). On routine preprocedure blood work the patient was noted to have hemoglobin of 7 g/dL so he was called and referred to the ER.  In the ED, he was FOBT positive but denied frank bleeding and was hemodynamically stable. TRH were asked to admit for anemia and GI bleeding  Consultants:  Gastroenterology  Cardiology  Procedures:  None  Antibiotics: Anti-infectives    None         HPI/Subjective: Status post EGD colonoscopy, no active bleeding overnight   Objective: Filed Vitals:   04/23/15 1105 04/23/15 1110 04/23/15 1120 04/23/15 1142  BP: 146/68 144/74 155/81 149/68  Pulse: 72 72 72 66  Temp:    98 F (36.7 C)  TempSrc:    Axillary  Resp: 15 15 15 18   Height:      Weight:      SpO2: 100% 100% 97% 96%    Intake/Output Summary (Last 24 hours) at 04/23/15 1327 Last data filed at 04/23/15 0800  Gross per 24 hour  Intake      0 ml  Output      0 ml  Net      0 ml    Exam:  General appearance: Well-developed, older adult male, alert and in no acute distress.  Eyes: Anicteric, lids and lashes normal.  ENT: No nasal deformity, discharge, or epistaxis. OP moist without lesions.  Skin: Warm and dry. Mild pallor. No flank bruising. Cardiac: RRR, nl S1-S2, 1/6 SEM at LUSB. Capillary refill is brisk. 1+ pretibial LE edema. Radial pulses 2+ and symmetric. No carotid bruits. Respiratory: Normal respiratory rate and rhythm. CTAB without rales or wheezes. Abdomen: Abdomen soft without rigidity. No TTP.No ascites, distension.  MSK: No deformities or effusions. Neuro: Sensorium intact and responding to questions, attention normal. Speech is fluent. Moves all extremities equally and  with normal coordination.    Data Review   Micro Results No results found for this or any previous visit (from the past 240 hour(s)).  Radiology Reports Dg Chest Port 1 View  04/21/2015  CLINICAL DATA:  81 year old male with shortness of breath EXAM: PORTABLE CHEST 1 VIEW COMPARISON:  Chest radiograph dated 07/17/2012 and read radiograph dated 06/26/2014 FINDINGS: Single portable view of the chest does not demonstrate any focal consolidation. There is minimal bibasilar atelectatic changes. Trace fluid may be present in the right minor fissure. There is no pneumothorax. Stable cardiac silhouette. The osseous structures are grossly unremarkable. IMPRESSION: No focal consolidation. Electronically Signed   By: Anner Crete M.D.   On: 04/21/2015 23:01     CBC  Recent Labs Lab 04/21/15 1003 04/21/15 2018 04/22/15 0237 04/22/15 1858 04/23/15 0332  WBC 7.2 7.3 6.4  --  8.4  HGB 7.0* 7.1* 6.4* 8.4* 8.9*  HCT 22.3* 22.8* 20.8* 26.6* 28.6*  PLT 330 305 262  --  304  MCV 86.4 87.7 87.8  --  87.2  MCH 27.1 27.3 26.6  --  27.1  MCHC 31.4 31.1 30.3  --  31.1  RDW 14.4 13.8 13.9  --  13.9  LYMPHSABS 1.7 2.0  --   --   --   MONOABS 0.9 0.5  --   --   --   EOSABS 0.4 0.3  --   --   --   BASOSABS 0.1 0.1  --   --   --     Chemistries   Recent Labs Lab 04/21/15 1003 04/21/15 2018 04/22/15 0237 04/23/15 0332  NA 136 140 139 142  K 4.5 4.7 3.8 4.5  CL 105 109 110 111  CO2 24 22 23 24   GLUCOSE 98 107* 91 97  BUN 26* 26* 24* 13  CREATININE 1.13* 1.36* 1.17 1.02  CALCIUM 8.8 8.9 8.3* 8.9  MG  --   --  2.0  --   AST  --   --   --  22  ALT  --   --   --  19  ALKPHOS  --   --   --  67  BILITOT  --   --   --  1.2   ------------------------------------------------------------------------------------------------------------------ estimated creatinine clearance is 66.7 mL/min (by C-G formula based on Cr of  1.02). ------------------------------------------------------------------------------------------------------------------ No results for input(s): HGBA1C in the last 72 hours. ------------------------------------------------------------------------------------------------------------------ No results for input(s): CHOL, HDL, LDLCALC, TRIG, CHOLHDL, LDLDIRECT in the last 72 hours. ------------------------------------------------------------------------------------------------------------------ No results for input(s): TSH, T4TOTAL, T3FREE, THYROIDAB in the last 72 hours.  Invalid input(s): FREET3 ------------------------------------------------------------------------------------------------------------------  Recent Labs  04/23/15 1200  FERRITIN 11*  TIBC 386  IRON 13*    Coagulation profile  Recent Labs Lab 04/21/15 1003 04/21/15 2018  INR 1.39 1.29    No results for input(s): DDIMER in the last 72 hours.  Cardiac Enzymes  Recent Labs Lab 04/21/15 2248  TROPONINI 0.07*   ------------------------------------------------------------------------------------------------------------------ Invalid input(s): POCBNP   CBG: No results for input(s): GLUCAP in the last 168 hours.     Studies: Dg Chest Port 1 View  04/21/2015  CLINICAL DATA:  82 year old male with shortness of breath EXAM: PORTABLE CHEST 1 VIEW COMPARISON:  Chest radiograph dated 07/17/2012 and read radiograph dated 06/26/2014 FINDINGS: Single portable view of the chest does not demonstrate any focal consolidation. There is minimal bibasilar atelectatic changes. Trace fluid may be present in the right minor fissure. There is no pneumothorax. Stable cardiac silhouette. The osseous structures are grossly unremarkable. IMPRESSION: No focal consolidation. Electronically Signed   By: Anner Crete M.D.   On: 04/21/2015 23:01      No results found for: HGBA1C Lab Results  Component Value Date   LDLCALC 53  01/20/2015   CREATININE 1.02 04/23/2015       Scheduled Meds: . atorvastatin  40 mg Oral Daily  . losartan  100 mg Oral Daily  . pantoprazole  40 mg Oral Q0600   Continuous Infusions: . sodium chloride 50 mL/hr at 04/22/15 1510    Principal Problem:   Anemia due to blood loss Active Problems:   Essential hypertension   OSA (obstructive sleep apnea)   PAF (paroxysmal atrial fibrillation) (HCC)   Gastritis and gastroduodenitis   UGIB (upper gastrointestinal bleed)   Anticoagulant long-term use   Guaiac positive stools    Time spent: 45 minutes   Meda Dudzinski  [facility] Pager [phone]. If 7PM-7AM, please contact night-coverage at [email or web address], password [facility] 04/23/2015, 1:27 PM  LOS: 2 days

## 2015-04-23 NOTE — Interval H&P Note (Signed)
History and Physical Interval Note:  04/23/2015 10:00 AM  Donald [name]  has presented today for surgery, with the diagnosis of gi bleed, anemia.  The various methods of treatment have been discussed with the patient and family. After consideration of risks, benefits and other options for treatment, the patient has consented to  Procedure(s): COLONOSCOPY (N/A) ESOPHAGOGASTRODUODENOSCOPY (EGD) (N/A) as a surgical intervention .  The patient's history has been reviewed, patient examined, no change in status, stable for surgery.  I have reviewed the patient's chart and labs.  Questions were answered to the patient's satisfaction.     Pricilla Riffle. Fuller Plan

## 2015-04-23 NOTE — Progress Notes (Signed)
   Currently in endoscopy suite.  We will follow and advise as needed.

## 2015-04-23 NOTE — Op Note (Signed)
[facility] [place], [id number]   ENDOSCOPY PROCEDURE REPORT  PATIENT: Donald [name], Donald [name]  MR#: [id number] BIRTHDATE: May 05, 1933 , 82  yrs. old GENDER: male ENDOSCOPIST: Ladene Artist, MD, [facility] REFERRED BY:  [facility] PROCEDURE DATE:  04/23/2015 PROCEDURE:  EGD w/ control of bleeding ASA CLASS:     Class III INDICATIONS:  anemia secondary to chronic blood loss and heme positive stool. MEDICATIONS: Residual sedation present and Versed 1 mg IV TOPICAL ANESTHETIC: Cetacaine Spray DESCRIPTION OF PROCEDURE: After the risks benefits and alternatives of the procedure were thoroughly explained, informed consent was obtained.  The Pentax Gastroscope [id number] endoscope was introduced through the mouth and advanced to the second portion of the duodenum , Without limitations.  The instrument was slowly withdrawn as the mucosa was fully examined.    STOMACH: An oozing arteriovenous malformation measuring 10mm in size was found in the gastric body. Argon plasma coagulation was applied to the site with complete hemostasis achieved.   The stomach otherwise appeared normal. ESOPHAGUS: There was a benign appearing stricture at the gastroesophageal junction.  The stricture was easily traversable. The esophagus otherwise appeared normal. DUODENUM: The duodenal mucosa showed no abnormalities in the bulb and 2nd part of the duodenum.  Retroflexed views revealed no abnormalities.     The scope was then withdrawn from the patient and the procedure completed.  COMPLICATIONS: There were no immediate complications.  ENDOSCOPIC IMPRESSION: 1.   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   Stricture at the gastroesophageal junction  RECOMMENDATIONS: 1.  Avoid NSAIDS for two weeks 2.  Continue PPI daily 3.  Can resume Eliquis in 2 days as clinically indicated  eSigned:  Ladene Artist, MD,  [facility] 04/23/2015 11:00 AM

## 2015-04-24 ENCOUNTER — Encounter (HOSPITAL_COMMUNITY): Payer: Self-pay | Admitting: Gastroenterology

## 2015-04-24 ENCOUNTER — Inpatient Hospital Stay (HOSPITAL_COMMUNITY): Payer: PPO

## 2015-04-24 DIAGNOSIS — I10 Essential (primary) hypertension: Secondary | ICD-10-CM

## 2015-04-24 DIAGNOSIS — Z7901 Long term (current) use of anticoagulants: Secondary | ICD-10-CM

## 2015-04-24 DIAGNOSIS — K2990 Gastroduodenitis, unspecified, without bleeding: Secondary | ICD-10-CM

## 2015-04-24 DIAGNOSIS — K2970 Gastritis, unspecified, without bleeding: Secondary | ICD-10-CM

## 2015-04-24 LAB — COMPREHENSIVE METABOLIC PANEL
ALK PHOS: 58 U/L (ref 38–126)
ALT: 16 U/L — AB (ref 17–63)
ANION GAP: 3 — AB (ref 5–15)
AST: 17 U/L (ref 15–41)
Albumin: 3 g/dL — ABNORMAL LOW (ref 3.5–5.0)
BUN: 9 mg/dL (ref 6–20)
CALCIUM: 8.4 mg/dL — AB (ref 8.9–10.3)
CHLORIDE: 111 mmol/L (ref 101–111)
CO2: 24 mmol/L (ref 22–32)
CREATININE: 1 mg/dL (ref 0.61–1.24)
Glucose, Bld: 100 mg/dL — ABNORMAL HIGH (ref 65–99)
Potassium: 4.4 mmol/L (ref 3.5–5.1)
SODIUM: 138 mmol/L (ref 135–145)
Total Bilirubin: 0.9 mg/dL (ref 0.3–1.2)
Total Protein: 5.3 g/dL — ABNORMAL LOW (ref 6.5–8.1)

## 2015-04-24 LAB — CBC
HCT: 23.5 % — ABNORMAL LOW (ref 39.0–52.0)
HCT: 27.6 % — ABNORMAL LOW (ref 39.0–52.0)
HCT: 29.4 % — ABNORMAL LOW (ref 39.0–52.0)
HEMOGLOBIN: 8.5 g/dL — AB (ref 13.0–17.0)
Hemoglobin: 7.6 g/dL — ABNORMAL LOW (ref 13.0–17.0)
Hemoglobin: 9.7 g/dL — ABNORMAL LOW (ref 13.0–17.0)
MCH: 28 pg (ref 26.0–34.0)
MCH: 27.3 pg (ref 26.0–34.0)
MCH: 28.4 pg (ref 26.0–34.0)
MCHC: 32.3 g/dL (ref 30.0–36.0)
MCHC: 30.8 g/dL (ref 30.0–36.0)
MCHC: 33 g/dL (ref 30.0–36.0)
MCV: 86.7 fL (ref 78.0–100.0)
MCV: 88.7 fL (ref 78.0–100.0)
MCV: 86.2 fL (ref 78.0–100.0)
PLATELETS: 242 10*3/uL (ref 150–400)
PLATELETS: 273 10*3/uL (ref 150–400)
PLATELETS: 301 10*3/uL (ref 150–400)
RBC: 2.71 MIL/uL — ABNORMAL LOW (ref 4.22–5.81)
RBC: 3.11 MIL/uL — AB (ref 4.22–5.81)
RBC: 3.41 MIL/uL — ABNORMAL LOW (ref 4.22–5.81)
RDW: 14.1 % (ref 11.5–15.5)
RDW: 13.9 % (ref 11.5–15.5)
RDW: 14 % (ref 11.5–15.5)
WBC: 8.1 10*3/uL (ref 4.0–10.5)
WBC: 6.9 10*3/uL (ref 4.0–10.5)
WBC: 9.3 10*3/uL (ref 4.0–10.5)

## 2015-04-24 LAB — PREPARE RBC (CROSSMATCH)

## 2015-04-24 MED ORDER — SODIUM CHLORIDE 0.9 % IV SOLN: Freq: Once | INTRAVENOUS | Status: DC

## 2015-04-24 NOTE — Progress Notes (Signed)
Patient self administered CPAP without issue. RT advised to call if any questions or issues.

## 2015-04-24 NOTE — Progress Notes (Signed)
  Echocardiogram 2D Echocardiogram has been performed.  Donald [name] 04/24/2015, 10:08 AM

## 2015-04-24 NOTE — Care Management Important Message (Signed)
Important Message  Patient Details  Name: Donald [name] MRN: [id number] Date of Birth: [birth year]   Medicare Important Message Given:  Yes    Jaeven Wanzer P [place] 04/24/2015, 1:35 PM

## 2015-04-24 NOTE — Progress Notes (Signed)
       Patient Name: Donald [name] Date of Encounter: 04/24/2015    SUBJECTIVE: Patient had endoscopic procedures yesterday. AV malformation and stomach was noted. Colonic polyp was excised. No apparent active bleeding. Hemoglobin however has drifted back down into the mid 7 range.  TELEMETRY:  Sinus rhythm Filed Vitals:   04/23/15 1343 04/23/15 2009 04/23/15 2120 04/24/15 0523  BP: 140/72 155/68 145/72 130/79  Pulse: 82 64 71 74  Temp: 97.6 F (36.4 C) 97.6 F (36.4 C)  98.1 F (36.7 C)  TempSrc: Oral Oral  Oral  Resp: 18 18  18   Height:      Weight:      SpO2: 100% 100%  98%    Intake/Output Summary (Last 24 hours) at 04/24/15 1107 Last data filed at 04/24/15 0900  Gross per 24 hour  Intake   1200 ml  Output    600 ml  Net    600 ml   LABS: Basic Metabolic Panel:  Recent Labs  04/22/15 0237 04/23/15 0332 04/24/15 0312  NA 139 142 138  K 3.8 4.5 4.4  CL 110 111 111  CO2 23 24 24   GLUCOSE 91 97 100*  BUN 24* 13 9  CREATININE 1.17 1.02 1.00  CALCIUM 8.3* 8.9 8.4*  MG 2.0  --   --    CBC:  Recent Labs  04/21/15 2018  04/23/15 0332 04/24/15 0312  WBC 7.3  < > 8.4 8.1  NEUTROABS 4.4  --   --   --   HGB 7.1*  < > 8.9* 7.6*  HCT 22.8*  < > 28.6* 23.5*  MCV 87.7  < > 87.2 86.7  PLT 305  < > 304 242  < > = values in this interval not displayed. Cardiac Enzymes:  Recent Labs  04/21/15 2248 04/23/15 1419  TROPONINI 0.07* 0.07*   Echocardiogram: Performed but not yet interpreted   Radiology/Studies:  No new data  Physical Exam: Blood pressure 130/79, pulse 74, temperature 98.1 F (36.7 C), temperature source Oral, resp. rate 18, height 5\' 9"  (1.753 m), weight 216 lb (97.977 kg), SpO2 98 %. Weight change:   Wt Readings from Last 3 Encounters:  04/23/15 216 lb (97.977 kg)  04/21/15 222 lb 9.6 oz (100.971 kg)  04/13/15 225 lb (102.059 kg)    Cardiac exam is unremarkable  ASSESSMENT:  1. Continued severe anemia. Potential sources of blood  loss have been identified. 2. Angina pectoris presumed secondary to severe anemia on top of potentially noncritical coronary disease. If patient continues to have angina after hemoglobin stable above 9.0, ischemic evaluation will be needed. 3. Paroxysmal atrial fibrillation  Plan:  1. Patient his hemoglobin greater than 9. If he still has ambulatory angina, will need to have an ischemic evaluation. If angina has resolved, no further ischemic workup will be necessary 2. Needs potential additional transfusions. 3. Eventually resume anticoagulation therapy after hemoglobin is up and stable above 9. Please see GI note. 4. We will follow  Signed, Sinclair Grooms 04/24/2015, 11:07 AM

## 2015-04-24 NOTE — Progress Notes (Addendum)
Daily Rounding Note  04/24/2015, 9:44 AM  LOS: 3 days   SUBJECTIVE:       Feels well.  No BMs.  No chest pain, no dyspnea  OBJECTIVE:         Vital signs in last 24 hours:    Temp:  [97.6 F (36.4 C)-98.1 F (36.7 C)] 98.1 F (36.7 C) (12/09 0523) Pulse Rate:  [64-82] 74 (12/09 0523) Resp:  [14-23] 18 (12/09 0523) BP: (121-170)/(68-104) 130/79 mmHg (12/09 0523) SpO2:  [88 %-100 %] 98 % (12/09 0523) Last BM Date: 04/23/15 Filed Weights   04/21/15 1956 04/22/15 0017 04/23/15 0915  Weight: 100.699 kg (222 lb) 98.3 kg (216 lb 11.4 oz) 97.977 kg (216 lb)   General: looks well.  NAD   Heart: RRR.   Chest: clear bil.  No dyspnea Abdomen: soft, NT, ND, active BS  Extremities: no CCE Neuro/Psych:  Oriented x 3.  Alert.  No gross deficits.   Intake/Output from previous day: 12/08 0701 - 12/09 0700 In: 720 [P.O.:720] Out: 300 [Urine:300]  Intake/Output this shift:    Lab Results:  Recent Labs  04/22/15 0237 04/22/15 1858 04/23/15 0332 04/24/15 0312  WBC 6.4  --  8.4 8.1  HGB 6.4* 8.4* 8.9* 7.6*  HCT 20.8* 26.6* 28.6* 23.5*  PLT 262  --  304 242   BMET  Recent Labs  04/22/15 0237 04/23/15 0332 04/24/15 0312  NA 139 142 138  K 3.8 4.5 4.4  CL 110 111 111  CO2 23 24 24   GLUCOSE 91 97 100*  BUN 24* 13 9  CREATININE 1.17 1.02 1.00  CALCIUM 8.3* 8.9 8.4*   LFT  Recent Labs  04/23/15 0332 04/24/15 0312  PROT 6.7 5.3*  ALBUMIN 3.4* 3.0*  AST 22 17  ALT 19 16*  ALKPHOS 67 58  BILITOT 1.2 0.9   PT/INR  Recent Labs  04/21/15 1003 04/21/15 2018  LABPROT 17.2* 16.3*  INR 1.39 1.29   Ferritin=11, Fe=13, Fe sat=3%  Scheduled Meds: . atorvastatin  40 mg Oral Daily  . losartan  100 mg Oral Daily  . pantoprazole  40 mg Oral Q0600   Continuous Infusions: . sodium chloride 50 mL/hr at 04/23/15 2336   PRN Meds:.   ASSESMENT:   * Symptomatic iron deficiency anemia in pt on Eliquis.  FOBT + but no overt bleeding.  Colonoscopy 12/8: Incomplete to hepatic flexure due to tortuous colon and inadequate sedation/pt discomfort.  Descending polyp removed (path pending), sigmoid tics, grade 1 int/ext hemorrhoids.  EGD 12/8: non-obstructing GEjx stricture not dilated, oozing gastric body AVM with hemostasis post APC ablation.   Hgb drifting down since PRBCs x 2, 12/7.  Per cardiologist, Dr Tamala Julian, would like Hgb at or above 9.  * Hx gastritis, on low dose maintenance Protonix PTA.Marland Kitchen Continue standard dose Protonix 40 mg daily as outpt.   * Recent exertional angina, set for cath on 12/14 now cxl'd.  Dr Tamala Julian will only pursue if pt having angina when hgb at or above 9.   * Daily ETOH, somewhat heavy (at least 4 oz daily) but no evidence liver disease and highly functional   * OSA on CPAP.   * Chronic Eliquis on hold. For hx a fib, s/p ablation and DCCV. Currently in NSR.     PLAN   *  MD will repeat the Hgb this afternoon and decide then re: more PRBCs.  *  Await polyp pathology. Consider  repeat colonoscopy with MAC sedation, defer decision to Dr Hilarie Fredrickson his GI PMD.   *  Stay on daily  PPI for hx gastritis.  *  Hold restarting  Eliquis until Hgb heading in upwards direction.   *  Cut back on ETOH.     Azucena Freed  04/24/2015, 9:44 AM Pager: [phone]      Attending physician's note   I have taken an interval history, reviewed the chart and examined the patient. I agree with the Advanced Practitioner's note, impression and recommendations. Begin Fe replacement. Consider IV Fe in hospital. Hb drifited down to 7.6, likely equilibrating. Consider transfusion, defer to primary service. GI signing off-available if needed. Outpatient GI follow up with Dr. Hilarie Fredrickson in 3-4 weeks.   Lucio Edward, MD [facility] [phone] Mon-Fri 8a-5p [phone] after 5p, weekends, holidays

## 2015-04-24 NOTE — Progress Notes (Signed)
[facility] PROGRESS NOTE  Donald [name] [id number] DOB: 1932/08/04 DOA: 04/21/2015 PCP: [name], MD  Length of stay: 3   Assessment/Plan: Principal Problem:   Anemia due to blood loss Active Problems:   Essential hypertension   OSA (obstructive sleep apnea)   PAF (paroxysmal atrial fibrillation) (HCC)   Gastritis and gastroduodenitis   UGIB (upper gastrointestinal bleed)   Anticoagulant long-term use   Guaiac positive stools    1. Blood loss anemia:  He has noted stools have been darker than normal.  hemoglobin dropped from  8.9> 7.6 overnight, status post transfusion of 2 units of packed red blood cells, hemoglobin now 8.9. 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 Internal and external hemorrhoids GI recommends  Outpatient follow up with Dr. Pyrtle-consider repeat colonoscopy with MAC or ACBE after 4 weeks to allow polypectomy site healing  GI to consider repeat colonoscopy? Continue PPI  continue to hold Eliquis , repeat CBC, continue to transfuse to keep hemoglobin greater than 9.0 as advised by cardiology   2. Angina:  This is new. Suspect this is acutely worsening because of anemia rather than unstable angina.  -Recommended transfusion to patient,Cardiology to make further recommendations about  Ischemic workup. Situation complicated by severe anemia requiring transfusion. GI recommends to resume eliquis if hemoglobin remains stable with no need for further transfusions  2-D echo shows EF of 55-60% with normal regional wall motion, grade 2 diastolic dysfunction  No ischemic workup until hemoglobin greater than 9.0.  3. Paroxysmal A. fib: tachycardia mediated cardiomyopathy, functionally bicuspid aortic  valve CHADS2-VASc 4. This corresponds to ~5-7% risk of TIA/stroke per year.  Hold apixaban 2 more days  4. OSA:  Continue home CPAP  5. CKD stage III:  Stable. , Baseline creatinine around 1.3.  6. Chronic diastolic CHF and HTN:  Stable. Euvolemic. Hold furosemide unless transfused Continue ARB  7.  History of EtOH abuse -INR 1.1  DVT prophylaxsis  holding  eliquis  given GI bleeding   Code Status:      Code Status Orders        Start     Ordered   04/22/15 0024  Do not attempt resuscitation (DNR)   Continuous    Question Answer Comment  In the event of cardiac or respiratory ARREST Do not call a "code blue"   In the event of cardiac or respiratory ARREST Do not perform Intubation, CPR, defibrillation or ACLS   In the event of cardiac or respiratory ARREST Use medication by any route, position, wound care, and other measures to relive pain and suffering. May use oxygen, suction and manual treatment of airway obstruction as needed for comfort.      04/22/15 0023    Advance Directive Documentation        Most Recent Value   Type of Advance Directive  Healthcare Power of Attorney, Living will [facility] [name]]   Pre-existing out of facility DNR order (yellow form or pink MOST form)     "MOST" Form in Place?        Family Communication: Discussed in detail with the patient, all imaging results, lab results explained to the patient   Disposition Plan:   Remains inpatient, for further transfusions  Brief narrative: 81 y.o. male with a past medical history significant for pAF s/p ablation on Eliquis, OSA on CPAP, HTN, and chronic diastolic CHF who presents with worsening exertional dyspnea, found at his Cardiologist to have new anemia.  The patient was in his usual state of health until about 3 weeks ago when he started to develop exertional intolerance and chest pressure. Gradually he has noticed more of this dyspnea and chest pressure while working out at [employer], working for [employer], volunteering putting back books at school and so he has had to limit these activities.The chest discomfort he describes as a pressure that radiates into the throat and is similar to anginal pain he has had with A. fib in the past.  For these reasons he went to see his cardiologist's office today, who recommended cardiac catheterization within the next 2 weeks (given a NM stress this past summer showing reversible defect). On routine preprocedure blood work the patient was noted to have hemoglobin of 7 g/dL so he was called and referred to the ER.  In the ED, he was FOBT positive but denied frank bleeding and was hemodynamically stable. TRH were asked to admit for anemia and GI bleeding  Consultants:  Gastroenterology  Cardiology  Procedures:  None  Antibiotics: Anti-infectives    None         HPI/Subjective:  I again repeated the results of the EGD and colonoscopy as the patient did not recall receiving the results yesterday after receiving sedation for the procedure , no obvious bleeding overnight   Objective: Filed Vitals:   04/23/15 1343 04/23/15 2009 04/23/15 2120 04/24/15 0523  BP: 140/72 155/68 145/72 130/79  Pulse: 82 64 71 74  Temp: 97.6 F (36.4 C) 97.6 F (36.4 C)  98.1 F (36.7 C)  TempSrc: Oral Oral  Oral  Resp: 18 18  18   Height:      Weight:      SpO2: 100% 100%  98%    Intake/Output Summary (Last 24 hours) at 04/24/15 1211 Last data filed at 04/24/15 0900  Gross per 24 hour  Intake   1200 ml  Output    600 ml  Net    600 ml    Exam:  General appearance: Well-developed, older adult male, alert and in no acute distress.  Eyes: Anicteric, lids and lashes normal.  ENT: No nasal deformity, discharge, or epistaxis. OP moist without lesions.  Skin: Warm and dry. Mild pallor. No flank bruising. Cardiac: RRR, nl S1-S2, 1/6 SEM at LUSB. Capillary refill is brisk. 1+ pretibial LE edema. Radial pulses 2+ and  symmetric. No carotid bruits. Respiratory: Normal respiratory rate and rhythm. CTAB without rales or wheezes. Abdomen: Abdomen soft without rigidity. No TTP.No ascites, distension.  MSK: No deformities or effusions. Neuro: Sensorium intact and responding to questions, attention normal. Speech is fluent. Moves all extremities equally and with normal coordination.    Data Review   Micro Results No results found for this or any previous visit (from the past 240 hour(s)).  Radiology Reports Dg Chest Port 1 View  04/21/2015  CLINICAL DATA:  81 year old male with shortness of breath EXAM: PORTABLE CHEST 1 VIEW COMPARISON:  Chest radiograph dated 07/17/2012 and read radiograph dated 06/26/2014 FINDINGS: Single portable view of the chest does not demonstrate any focal consolidation. There is minimal bibasilar atelectatic changes. Trace fluid may be present in the right minor fissure. There is no pneumothorax. Stable cardiac silhouette. The osseous structures are grossly unremarkable. IMPRESSION: No  focal consolidation. Electronically Signed   By: Anner Crete M.D.   On: 04/21/2015 23:01     CBC  Recent Labs Lab 04/21/15 1003 04/21/15 2018 04/22/15 0237 04/22/15 1858 04/23/15 0332 04/24/15 0312  WBC 7.2 7.3 6.4  --  8.4 8.1  HGB 7.0* 7.1* 6.4* 8.4* 8.9* 7.6*  HCT 22.3* 22.8* 20.8* 26.6* 28.6* 23.5*  PLT 330 305 262  --  304 242  MCV 86.4 87.7 87.8  --  87.2 86.7  MCH 27.1 27.3 26.6  --  27.1 28.0  MCHC 31.4 31.1 30.3  --  31.1 32.3  RDW 14.4 13.8 13.9  --  13.9 14.1  LYMPHSABS 1.7 2.0  --   --   --   --   MONOABS 0.9 0.5  --   --   --   --   EOSABS 0.4 0.3  --   --   --   --   BASOSABS 0.1 0.1  --   --   --   --     Chemistries   Recent Labs Lab 04/21/15 1003 04/21/15 2018 04/22/15 0237 04/23/15 0332 04/24/15 0312  NA 136 140 139 142 138  K 4.5 4.7 3.8 4.5 4.4  CL 105 109 110 111 111  CO2 24 22 23 24 24   GLUCOSE 98 107* 91 97 100*  BUN 26* 26* 24* 13 9   CREATININE 1.13* 1.36* 1.17 1.02 1.00  CALCIUM 8.8 8.9 8.3* 8.9 8.4*  MG  --   --  2.0  --   --   AST  --   --   --  22 17  ALT  --   --   --  19 16*  ALKPHOS  --   --   --  67 58  BILITOT  --   --   --  1.2 0.9   ------------------------------------------------------------------------------------------------------------------ estimated creatinine clearance is 68 mL/min (by C-G formula based on Cr of 1). ------------------------------------------------------------------------------------------------------------------ No results for input(s): HGBA1C in the last 72 hours. ------------------------------------------------------------------------------------------------------------------ No results for input(s): CHOL, HDL, LDLCALC, TRIG, CHOLHDL, LDLDIRECT in the last 72 hours. ------------------------------------------------------------------------------------------------------------------ No results for input(s): TSH, T4TOTAL, T3FREE, THYROIDAB in the last 72 hours.  Invalid input(s): FREET3 ------------------------------------------------------------------------------------------------------------------  Recent Labs  04/23/15 1200  FERRITIN 11*  TIBC 386  IRON 13*    Coagulation profile  Recent Labs Lab 04/21/15 1003 04/21/15 2018  INR 1.39 1.29    No results for input(s): DDIMER in the last 72 hours.  Cardiac Enzymes  Recent Labs Lab 04/21/15 2248 04/23/15 1419  TROPONINI 0.07* 0.07*   ------------------------------------------------------------------------------------------------------------------ Invalid input(s): POCBNP   CBG: No results for input(s): GLUCAP in the last 168 hours.     Studies: No results found.    No results found for: HGBA1C Lab Results  Component Value Date   LDLCALC 53 01/20/2015   CREATININE 1.00 04/24/2015       Scheduled Meds: . sodium chloride   Intravenous Once  . atorvastatin  40 mg Oral Daily  . losartan  100 mg  Oral Daily  . pantoprazole  40 mg Oral Q0600   Continuous Infusions: . sodium chloride 50 mL/hr at 04/23/15 2336    Principal Problem:   Anemia due to blood loss Active Problems:   Essential hypertension   OSA (obstructive sleep apnea)   PAF (paroxysmal atrial fibrillation) (HCC)   Gastritis and gastroduodenitis   UGIB (upper gastrointestinal bleed)   Anticoagulant long-term use   Guaiac positive stools  Time spent: 45 minutes   [facility] Pager [phone]. If 7PM-7AM, please contact night-coverage at [email or web address], password [facility] 04/24/2015, 12:11 PM  LOS: 3 days

## 2015-04-25 DIAGNOSIS — I208 Other forms of angina pectoris: Secondary | ICD-10-CM

## 2015-04-25 DIAGNOSIS — Z8679 Personal history of other diseases of the circulatory system: Secondary | ICD-10-CM

## 2015-04-25 DIAGNOSIS — G4733 Obstructive sleep apnea (adult) (pediatric): Secondary | ICD-10-CM

## 2015-04-25 LAB — TYPE AND SCREEN
ABO/RH(D): O NEG
Antibody Screen: NEGATIVE
UNIT DIVISION: 0
UNIT DIVISION: 0
UNIT DIVISION: 0
Unit division: 0

## 2015-04-25 LAB — CBC
HCT: 27.1 % — ABNORMAL LOW (ref 39.0–52.0)
Hemoglobin: 8.8 g/dL — ABNORMAL LOW (ref 13.0–17.0)
MCH: 27.9 pg (ref 26.0–34.0)
MCHC: 32.5 g/dL (ref 30.0–36.0)
MCV: 86 fL (ref 78.0–100.0)
PLATELETS: 259 10*3/uL (ref 150–400)
RBC: 3.15 MIL/uL — AB (ref 4.22–5.81)
RDW: 14.1 % (ref 11.5–15.5)
WBC: 8.5 10*3/uL (ref 4.0–10.5)

## 2015-04-25 LAB — COMPREHENSIVE METABOLIC PANEL
ALT: 15 U/L — AB (ref 17–63)
ANION GAP: 5 (ref 5–15)
AST: 16 U/L (ref 15–41)
Albumin: 3 g/dL — ABNORMAL LOW (ref 3.5–5.0)
Alkaline Phosphatase: 63 U/L (ref 38–126)
BUN: 10 mg/dL (ref 6–20)
CALCIUM: 8.7 mg/dL — AB (ref 8.9–10.3)
CHLORIDE: 111 mmol/L (ref 101–111)
CO2: 24 mmol/L (ref 22–32)
CREATININE: 0.99 mg/dL (ref 0.61–1.24)
Glucose, Bld: 103 mg/dL — ABNORMAL HIGH (ref 65–99)
Potassium: 4.3 mmol/L (ref 3.5–5.1)
SODIUM: 140 mmol/L (ref 135–145)
Total Bilirubin: 1.2 mg/dL (ref 0.3–1.2)
Total Protein: 5.6 g/dL — ABNORMAL LOW (ref 6.5–8.1)

## 2015-04-25 MED ORDER — FERROUS SULFATE 325 (65 FE) MG PO TABS
325.0000 mg | ORAL_TABLET | Freq: Every day | ORAL | Status: DC
Start: 2015-04-25 — End: 2021-08-13

## 2015-04-25 MED ORDER — PANTOPRAZOLE SODIUM 40 MG PO TBEC
40.0000 mg | DELAYED_RELEASE_TABLET | Freq: Two times a day (BID) | ORAL | Status: DC
Start: 2015-04-25 — End: 2015-06-29

## 2015-04-25 MED ORDER — FUROSEMIDE 40 MG PO TABS: 40.0000 mg | ORAL_TABLET | Freq: Every day | ORAL | Status: DC

## 2015-04-25 MED ORDER — PANTOPRAZOLE SODIUM 40 MG PO TBEC: 40.0000 mg | DELAYED_RELEASE_TABLET | Freq: Every day | ORAL | Status: DC

## 2015-04-25 MED ORDER — SODIUM CHLORIDE 0.9 % IV SOLN
125.0000 mg | Freq: Once | INTRAVENOUS | Status: AC
  Administered 2015-04-25: 125 mg via INTRAVENOUS
  Filled 2015-04-25: qty 10

## 2015-04-25 NOTE — Discharge Summary (Signed)
Physician Discharge Summary  Donald [name] MRN: [id number] DOB/AGE: 79-12-1932 79 y.o.  PCP: [name], MD   Admit date: 04/21/2015 Discharge date: 04/25/2015  Discharge Diagnoses:   Principal Problem:   Anemia due to blood loss Active Problems:   Essential hypertension   OSA (obstructive sleep apnea)   PAF (paroxysmal atrial fibrillation) (HCC)   Gastritis and gastroduodenitis   UGIB (upper gastrointestinal bleed)   Anticoagulant long-term use   Guaiac positive stools    Follow-up recommendations Follow-up with PCP in 3-5 days , including all  additional recommended appointments as below Follow-up CBC, CMP in 3-5 days Follow-up with cardiology next week before restarting Eliquis, and to discuss further ischemic testing Outpatient GI follow up with Dr. Hilarie Fredrickson in 3-4 weeks.Await polyp pathology. Consider repeat colonoscopy with MAC sedation Follow CBC closely to keep hemoglobin greater than 9.0 before restarting anticoagulation     Medication List    STOP taking these medications        apixaban 5 MG Tabs tablet  Commonly known as:  ELIQUIS      TAKE these medications        atorvastatin 40 MG tablet  Commonly known as:  LIPITOR  TAKE 1 TABLET ONCE DAILY.     diltiazem 240 MG 24 hr capsule  Commonly known as:  DILACOR XR  Take 240 mg by mouth daily as needed (increased heart rate).     EYE VITAMINS PO  Take 1 tablet by mouth 2 (two) times daily. Macular Supplement     ferrous sulfate 325 (65 FE) MG tablet  Take 1 tablet (325 mg total) by mouth daily with breakfast.     fluticasone 50 MCG/ACT nasal spray  Commonly known as:  FLONASE  Place 1 spray into both nostrils daily as needed for allergies or rhinitis. **SHAKE GENTLY**     furosemide 40 MG tablet  Commonly known as:  LASIX  Take 1 tablet (40 mg total) by mouth daily.  Start taking on:  05/01/2015     loratadine 10 MG tablet  Commonly known as:  CLARITIN  Take 10 mg by mouth daily as needed  for allergies.     losartan 100 MG tablet  Commonly known as:  COZAAR  Take 1 tablet (100 mg total) by mouth daily.     nitroGLYCERIN 0.4 MG SL tablet  Commonly known as:  NITROSTAT  Place 1 tablet (0.4 mg total) under the tongue every 5 (five) minutes as needed for chest pain.     pantoprazole 40 MG tablet  Commonly known as:  PROTONIX  Take 1 tablet (40 mg total) by mouth 2 (two) times daily.         Discharge Condition: Stable   Discharge Instructions       Discharge Instructions    Diet - low sodium heart healthy    Complete by:  As directed      Increase activity slowly    Complete by:  As directed            No Known Allergies    Disposition: 01-Home or Self Care   Consults:  Cardiology Gastroenterology     Significant Diagnostic Studies:  Dg Chest Port 1 View  04/21/2015  CLINICAL DATA:  81 year old male with shortness of breath EXAM: PORTABLE CHEST 1 VIEW COMPARISON:  Chest radiograph dated 07/17/2012 and read radiograph dated 06/26/2014 FINDINGS: Single portable view of the chest does not demonstrate any focal consolidation. There is minimal bibasilar atelectatic changes. Trace  fluid may be present in the right minor fissure. There is no pneumothorax. Stable cardiac silhouette. The osseous structures are grossly unremarkable. IMPRESSION: No focal consolidation. Electronically Signed   By: Anner Crete M.D.   On: 04/21/2015 23:01      Filed Weights   04/21/15 1956 04/22/15 0017 04/23/15 0915  Weight: 100.699 kg (222 lb) 98.3 kg (216 lb 11.4 oz) 97.977 kg (216 lb)     Microbiology: No results found for this or any previous visit (from the past 240 hour(s)).     Blood Culture No results found for: SDES, SPECREQUEST, CULT, REPTSTATUS    Labs: Results for orders placed or performed during the hospital encounter of 04/21/15 (from the past 48 hour(s))  Ferritin     Status: Abnormal   Collection Time: 04/23/15 12:00 PM  Result Value  Ref Range   Ferritin 11 (L) 24 - 336 ng/mL  Iron and TIBC     Status: Abnormal   Collection Time: 04/23/15 12:00 PM  Result Value Ref Range   Iron 13 (L) 45 - 182 ug/dL   TIBC 386 250 - 450 ug/dL   Saturation Ratios 3 (L) 17.9 - 39.5 %   UIBC 373 ug/dL  Troponin I     Status: Abnormal   Collection Time: 04/23/15  2:19 PM  Result Value Ref Range   Troponin I 0.07 (H) <0.031 ng/mL    Comment:        PERSISTENTLY INCREASED TROPONIN VALUES IN THE RANGE OF 0.04-0.49 ng/mL CAN BE SEEN IN:       -UNSTABLE ANGINA       -CONGESTIVE HEART FAILURE       -MYOCARDITIS       -CHEST TRAUMA       -ARRYHTHMIAS       -LATE PRESENTING MYOCARDIAL INFARCTION       -COPD   CLINICAL FOLLOW-UP RECOMMENDED.   CBC     Status: Abnormal   Collection Time: 04/24/15  3:12 AM  Result Value Ref Range   WBC 8.1 4.0 - 10.5 K/uL   RBC 2.71 (L) 4.22 - 5.81 MIL/uL   Hemoglobin 7.6 (L) 13.0 - 17.0 g/dL   HCT 23.5 (L) 39.0 - 52.0 %   MCV 86.7 78.0 - 100.0 fL   MCH 28.0 26.0 - 34.0 pg   MCHC 32.3 30.0 - 36.0 g/dL   RDW 14.1 11.5 - 15.5 %   Platelets 242 150 - 400 K/uL  Comprehensive metabolic panel     Status: Abnormal   Collection Time: 04/24/15  3:12 AM  Result Value Ref Range   Sodium 138 135 - 145 mmol/L   Potassium 4.4 3.5 - 5.1 mmol/L   Chloride 111 101 - 111 mmol/L   CO2 24 22 - 32 mmol/L   Glucose, Bld 100 (H) 65 - 99 mg/dL   BUN 9 6 - 20 mg/dL   Creatinine, Ser 1.00 0.61 - 1.24 mg/dL   Calcium 8.4 (L) 8.9 - 10.3 mg/dL   Total Protein 5.3 (L) 6.5 - 8.1 g/dL   Albumin 3.0 (L) 3.5 - 5.0 g/dL   AST 17 15 - 41 U/L   ALT 16 (L) 17 - 63 U/L   Alkaline Phosphatase 58 38 - 126 U/L   Total Bilirubin 0.9 0.3 - 1.2 mg/dL   GFR calc non Af Amer >60 >60 mL/min   GFR calc Af Amer >60 >60 mL/min    Comment: (NOTE) The eGFR has been calculated using the CKD EPI  equation. This calculation has not been validated in all clinical situations. eGFR's persistently <60 mL/min signify possible Chronic  Kidney Disease.    Anion gap 3 (L) 5 - 15  Prepare RBC     Status: None   Collection Time: 04/24/15 12:20 PM  Result Value Ref Range   Order Confirmation ORDER PROCESSED BY BLOOD BANK   CBC     Status: Abnormal   Collection Time: 04/24/15  1:00 PM  Result Value Ref Range   WBC 6.9 4.0 - 10.5 K/uL   RBC 3.11 (L) 4.22 - 5.81 MIL/uL   Hemoglobin 8.5 (L) 13.0 - 17.0 g/dL   HCT 27.6 (L) 39.0 - 52.0 %   MCV 88.7 78.0 - 100.0 fL   MCH 27.3 26.0 - 34.0 pg   MCHC 30.8 30.0 - 36.0 g/dL   RDW 13.9 11.5 - 15.5 %   Platelets 273 150 - 400 K/uL  CBC     Status: Abnormal   Collection Time: 04/24/15  6:36 PM  Result Value Ref Range   WBC 9.3 4.0 - 10.5 K/uL    Comment: REPEATED TO VERIFY WHITE COUNT CONFIRMED ON SMEAR    RBC 3.41 (L) 4.22 - 5.81 MIL/uL   Hemoglobin 9.7 (L) 13.0 - 17.0 g/dL   HCT 29.4 (L) 39.0 - 52.0 %   MCV 86.2 78.0 - 100.0 fL   MCH 28.4 26.0 - 34.0 pg   MCHC 33.0 30.0 - 36.0 g/dL   RDW 14.0 11.5 - 15.5 %   Platelets 301 150 - 400 K/uL    Comment: SPECIMEN CHECKED FOR CLOTS REPEATED TO VERIFY PLATELET COUNT CONFIRMED BY SMEAR   CBC     Status: Abnormal   Collection Time: 04/25/15  2:20 AM  Result Value Ref Range   WBC 8.5 4.0 - 10.5 K/uL   RBC 3.15 (L) 4.22 - 5.81 MIL/uL   Hemoglobin 8.8 (L) 13.0 - 17.0 g/dL   HCT 27.1 (L) 39.0 - 52.0 %   MCV 86.0 78.0 - 100.0 fL   MCH 27.9 26.0 - 34.0 pg   MCHC 32.5 30.0 - 36.0 g/dL   RDW 14.1 11.5 - 15.5 %   Platelets 259 150 - 400 K/uL  Comprehensive metabolic panel     Status: Abnormal   Collection Time: 04/25/15  2:20 AM  Result Value Ref Range   Sodium 140 135 - 145 mmol/L   Potassium 4.3 3.5 - 5.1 mmol/L   Chloride 111 101 - 111 mmol/L   CO2 24 22 - 32 mmol/L   Glucose, Bld 103 (H) 65 - 99 mg/dL   BUN 10 6 - 20 mg/dL   Creatinine, Ser 0.99 0.61 - 1.24 mg/dL   Calcium 8.7 (L) 8.9 - 10.3 mg/dL   Total Protein 5.6 (L) 6.5 - 8.1 g/dL   Albumin 3.0 (L) 3.5 - 5.0 g/dL   AST 16 15 - 41 U/L   ALT 15 (L) 17 - 63 U/L    Alkaline Phosphatase 63 38 - 126 U/L   Total Bilirubin 1.2 0.3 - 1.2 mg/dL   GFR calc non Af Amer >60 >60 mL/min   GFR calc Af Amer >60 >60 mL/min    Comment: (NOTE) The eGFR has been calculated using the CKD EPI equation. This calculation has not been validated in all clinical situations. eGFR's persistently <60 mL/min signify possible Chronic Kidney Disease.    Anion gap 5 5 - 15     Lipid Panel     Component Value  Date/Time   CHOL 121 01/20/2015 1621   TRIG 84.0 01/20/2015 1621   HDL 51.10 01/20/2015 1621   CHOLHDL 2 01/20/2015 1621   VLDL 16.8 01/20/2015 1621   LDLCALC 53 01/20/2015 1621   LDLDIRECT 138.8 04/18/2011 1020     No results found for: HGBA1C   Lab Results  Component Value Date   LDLCALC 53 01/20/2015   CREATININE 0.99 04/25/2015     HPI :81 y/o male, followed by Dr. Caryl Comes, with a h/o paroxsymal atrial flutter with associated tachycardia mediated cardiomyopathy. He has been on chronic anticoagulation with Eliquis. He underwent RFCA by Dr. Caryl Comes 03/2014 and his LV function improved once in normal sinus rthythm. His most recent 2D echo 02/2014 showed normal EF at 55-60%. Also with h/o functionally bicuspid aortic valve, diastolic HF, HTN, HLD, ETOH abuse and skin CA. In 03/2014, he also underwent an EGD to evaluate possible gastric neoplasm. Biopsy demonstrated chronic active gastritis only.In June 2016, the patient complained of chest pain and Dr. Caryl Comes ordered a myoview NST, which was intermediate risk with mild ischemia in the LCx/OM territory. It was decided to pursue medical therapy with PRN nitroglycerin prior to exercise and avoid invasive workup at that time. He was last seen by Dr. Caryl Comes 12/23/14. He noted improved exercise tolerance. He was no longer having chest discomfort.  He presented back to the office 04/20/15 with a complaint of decreased exercise tolerance and chest discomfort with associated dyspnea with minimal activity. He was seen by Richardson Dopp, PA-C. Given his worsening symptoms and abnormal stress test earlier in the year, he recommended a LHC to assess his coronary anatomy. As part of pre-procedural w/u, basic labs including a CBC was ordered. He was found to have anemia with a hgb of 7.0. This was in comparision to 14.0 , 7 months ago and 13.0 3 months ago. He was instructed to report to the ED for admission. He was admitted by IM. Plan is for GI consult. He has had further reduction in Hgb, now at 6.4. FOBT is positive. He is on IV Protonix. Eliquis discontinued. Gastroenterology was consulted for further workup of his anemia. Cardiology has been consulted for CP.   The patient notes a 3 week h/o melena, which correlates to the onset of his CP and decreased exercise tolerance. No symptoms at rest. EKG shows NSR w/o ischemic changes. BP is stable.   HOSPITAL COURSE:    1. Blood loss anemia:  Hemoglobin drop to 6.4, after transfusion of 3 units of pack red blood cells and intermittent post transfusion dips, hemoglobin was 8.8 on the day of discharge. 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 Internal and external hemorrhoids GI recommends Outpatient follow up with Dr. Pyrtle-consider repeat colonoscopy with MAC or ACBE after 4 weeks to allow polypectomy site healing Continue PPI, patient received IV iron prior to discharge and started on ferrous sulfate 325 mg twice a day continue to hold Eliquis until seen by cardiology and hemoglobin stays greater than 9.0 ,    2. Angina: Troponin I  0.07 2. ECG without acute ST segment changes, 2-D echo without wall motion abnormalities Suspect this is acutely worsening because of anemia rather than unstable  angina.  -Recommended transfusion to keep hemoglobin greater than  9.0, Cardiology to make further recommendations about Ischemic workup as outpatient. Situation complicated by severe anemia requiring transfusion. No further inpatient workup recommended at this time GI recommends to resume eliquis if hemoglobin remains stable with no need for further transfusions 2-D echo shows EF of 55-60% with normal regional wall motion, grade 2 diastolic dysfunction No ischemic workup until hemoglobin greater than 9.0.    3. Paroxysmal A. fib: tachycardia mediated cardiomyopathy, functionally bicuspid aortic valve CHADS2-VASc 4. This corresponds to ~5-7% risk of TIA/stroke per year.  Hold apixaban 2 more days  4. OSA:  Continue home CPAP  5. CKD stage III:  Stable. , Baseline creatinine around 1.3. 0.99 prior to discharge  6. Chronic diastolic CHF and HTN:  Stable. Euvolemic. Hold furosemide unless transfused Continue ARB  7. History of EtOH abuse -INR 1.1  Discharge Exam:  Blood pressure 149/73, pulse 62, temperature 97.8 F (36.6 C), temperature source Axillary, resp. rate 18, height $RemoveBe'5\' 9"'tJBAXSsOo$  (1.753 m), weight 97.977 kg (216 lb), SpO2 98 %.   General appearance: Well-developed, older adult male, alert and in no acute distress.  Eyes: Anicteric, lids and lashes normal.  ENT: No nasal deformity, discharge, or epistaxis. OP moist without lesions.  Skin: Warm and dry. Mild pallor. No flank bruising. Cardiac: RRR, nl S1-S2, 1/6 SEM at LUSB. Capillary refill is brisk. 1+ pretibial LE edema. Radial pulses 2+ and symmetric. No carotid bruits. Respiratory: Normal respiratory rate and rhythm. CTAB without rales or wheezes. Abdomen: Abdomen soft without rigidity. No TTP.No ascites, distension.  MSK: No deformities or effusions. Neuro: Sensorium intact and responding to questions, attention normal. Speech is fluent. Moves all extremities equally and with normal  coordination   Follow-up Information    Follow up with Jerene Bears, MD On 06/18/2015.   Specialty:  Gastroenterology   Why:  11AM for GI follow up.    Contact information:   [address] [place] [address] [phone]       Follow up with [name], MD. Schedule an appointment as soon as possible for a visit in 3 days.   Specialty:  Internal Medicine   Why:  repeat cbc , resume eliquis for hg >9.0   Contact information:   [address] [address] [phone]       Follow up with Sinclair Grooms, MD. Schedule an appointment as soon as possible for a visit in 1 week.   Specialty:  Cardiology   Contact information:   [address] [place] [address] [phone]       Signed: Reyne Dumas 04/25/2015, 11:26 AM        Time spent >45 mins

## 2015-04-25 NOTE — Progress Notes (Signed)
Discharge- Pt was given information about follow-up appointments, current medications, prescriptions, and discharge instructions. Pt's IV and telemetry box were removed. Pt was discharged off the unit via wheelchair and was accompanied by an Therapist, sports. Pt was in no distress.  -Grant Fontana RN, BSN

## 2015-04-25 NOTE — Progress Notes (Signed)
Primary cardiologist: Dr. Virl Axe  Seen for followup: Angina, anemia with GI bleed, PAF  Subjective:    Up in chair this morning. Reports no chest pain on ambulating around the room. He has not noticed any dark stools or hematochezia. Still on clears. No abdominal pain. Eager to go home.  Objective:   Temp:  [97.8 F (36.6 C)-98.8 F (37.1 C)] 97.8 F (36.6 C) (12/10 0446) Pulse Rate:  [62-80] 62 (12/10 0446) Resp:  [18] 18 (12/10 0446) BP: (135-160)/(71-86) 149/73 mmHg (12/10 0446) SpO2:  [97 %-100 %] 98 % (12/10 0446) Last BM Date: 04/23/15  Filed Weights   04/21/15 1956 04/22/15 0017 04/23/15 0915  Weight: 222 lb (100.699 kg) 216 lb 11.4 oz (98.3 kg) 216 lb (97.977 kg)    Intake/Output Summary (Last 24 hours) at 04/25/15 0950 Last data filed at 04/24/15 1630  Gross per 24 hour  Intake    647 ml  Output    700 ml  Net    -53 ml    Telemetry: Sinus rhythm with PACs.  Exam:  General: Patient appears comfortable at rest.  HEENT: Conjunctiva and lids normal, oropharynx clear.  Lungs: Clear, nonlabored.  Cardiac: RRR without gallop.  Abdomen: NABS.  Extremities: No pitting edema.  Lab Results:  Basic Metabolic Panel:  Recent Labs Lab 04/22/15 0237 04/23/15 0332 04/24/15 0312 04/25/15 0220  NA 139 142 138 140  K 3.8 4.5 4.4 4.3  CL 110 111 111 111  CO2 23 24 24 24   GLUCOSE 91 97 100* 103*  BUN 24* 13 9 10   CREATININE 1.17 1.02 1.00 0.99  CALCIUM 8.3* 8.9 8.4* 8.7*  MG 2.0  --   --   --     Liver Function Tests:  Recent Labs Lab 04/23/15 0332 04/24/15 0312 04/25/15 0220  AST 22 17 16   ALT 19 16* 15*  ALKPHOS 67 58 63  BILITOT 1.2 0.9 1.2  PROT 6.7 5.3* 5.6*  ALBUMIN 3.4* 3.0* 3.0*    CBC:  Recent Labs Lab 04/24/15 1300 04/24/15 1836 04/25/15 0220  WBC 6.9 9.3 8.5  HGB 8.5* 9.7* 8.8*  HCT 27.6* 29.4* 27.1*  MCV 88.7 86.2 86.0  PLT 273 301 259    Cardiac Enzymes:  Recent Labs Lab 04/21/15 2248 04/23/15 1419    TROPONINI 0.07* 0.07*    Echocardiogram 04/24/2015: Study Conclusions  - Left ventricle: The cavity size was normal. There was moderate concentric hypertrophy. Systolic function was normal. The estimated ejection fraction was in the range of 55% to 60%. Wall motion was normal; there were no regional wall motion abnormalities. Features are consistent with a pseudonormal left ventricular filling pattern, with concomitant abnormal relaxation and increased filling pressure (grade 2 diastolic dysfunction). Doppler parameters are consistent with elevated ventricular end-diastolic filling pressure. - Aortic valve: A thickening and calcification of the right coronary leaflet that is uncahnged from the prior study. There was mild stenosis. There was mild to moderate regurgitation. - Aortic root: The aortic root was normal in size. - Ascending aorta: The ascending aorta was normal in size. - Mitral valve: Structurally normal valve. There was mild regurgitation. - Left atrium: The atrium was moderately dilated. - Right ventricle: Systolic function was normal. - Right atrium: The atrium was normal in size. - Tricuspid valve: There was mild regurgitation. - Pulmonary arteries: The main pulmonary artery was normal-sized. Systolic pressure was mildly increased. PA peak pressure: 35 mm Hg (S). - Line: A venous catheter was visualized in  the superior vena cava, with its tip in the right atrium. No abnormal features noted. - Inferior vena cava: The vessel was dilated. The respirophasic diameter changes were blunted (< 50%), consistent with elevated central venous pressure. - Pericardium, extracardiac: There was no pericardial effusion.   Medications:   Scheduled Medications: . sodium chloride   Intravenous Once  . atorvastatin  40 mg Oral Daily  . ferric gluconate (FERRLECIT/NULECIT) IV  125 mg Intravenous Once  . losartan  100 mg Oral Daily  . pantoprazole   40 mg Oral Q0600     Infusions: . sodium chloride 50 mL/hr at 04/24/15 2114      Assessment:   1. Exertional angina in the setting of severe anemia. Troponin I 0.07. ECG without acute ST segment changes.  2. PAF, maintaining sinus rhythm. CHADSVASC score is 4. Eliquis is currently on hold.  3. Iron deficiency anemia. EGD on 12/8 showed oozing gastric body AVM which was treated with ablation. Also had descending colonic polyp removed by colonoscopy. Recommendation is to hold off on resuming Eliquis until hemoglobin is stable at or above 9.  4. OSA on CPAP.  5. CKD, stage 3. Creatinine currently normal.  6. Essential hypertension, on Cozaar.  7. History of tachycardia-mediated cardiomyopathy with subsequent normalization of LVEF, now 55-60% by recent echocardiogram. Patient had normal rest only Myoview in October 2015 (stress images were cancelled due to rapid atrial fibrillation).  Plan/Discussion:    Discussed with patient. Do not anticipate any further ischemic testing as an inpatient. This can be reconsidered depending on how he does with more consistent stability in his hemoglobin. Would not resume Eliquis as yet until hemoglobin is documented to be stable around 9 or higher - maybe over the next few days. Not clear that he will need another PRBC transfusion now, although his hemoglobin did drop compared to yesterday, currently at 8.8. If he does go home, would arrange a follow-up visit in our office in the next week so that we can get him back on Eliquis in the short-term presuming hemoglobin remains stable and he has no obvious bleeding.   Satira Sark, M.D., F.A.C.C.

## 2015-04-27 ENCOUNTER — Telehealth: Payer: Self-pay | Admitting: *Deleted

## 2015-04-27 ENCOUNTER — Telehealth: Payer: Self-pay | Admitting: Interventional Cardiology

## 2015-04-27 NOTE — Telephone Encounter (Signed)
New Message   Pt wants to cancel the [facility] LAB  04/29/15

## 2015-04-27 NOTE — Telephone Encounter (Signed)
Spoke with patient who was recently hospitalized secondary to anemia/+FOBT.  States that cardiac cath was to be cancelled for now and rescheduled at at later date if still needed.   Patient explains that Dr. Tamala Julian stated that cath would be cancelled and reevaluated at a later time. Dr. Tamala Julian wants Hgb to be at/above 9.0 before cath to be performed.  Hgb level to be re-drawn on Wednesday. Patient states that he will not be there Wednesday for cath and will see PA in office next week to determine if cath is still needed. I called cath lab and cancelled procedure for this Wednesday. Will forward this to Dr. Tamala Julian and Dr. Caryl Comes as Juluis Rainier.

## 2015-04-27 NOTE — Telephone Encounter (Signed)
Transition Care Management Follow-up Telephone Call   Date discharged? 04/25/15   How have you been since you were released from the hospital? Pt states he is doing ok   Do you understand why you were in the hospital? YES   Do you understand the discharge instructions? YES   Where were you discharged to? Home   Items Reviewed:  Medications reviewed: YES  Allergies reviewed: YES  Dietary changes reviewed: YES  Referrals reviewed:    Functional Questionnaire:   Activities of Daily Living (ADLs):   He states they are independent in the following: ambulation, bathing and hygiene, feeding, continence, grooming, toileting and dressing States he really don't  require assistance    Any transportation issues/concerns?: YES   Any patient concerns? NO   Confirmed importance and date/time of follow-up visits scheduled  YES, appt made 1214/16  Provider Appointment booked with Dr. Ronnald Ramp  Confirmed with patient if condition begins to worsen call PCP or go to the ER.  Patient was given the office number and encouraged to call back with question or concerns.  : YES

## 2015-04-27 NOTE — Telephone Encounter (Signed)
Thanks for the note. I agree with his statement. If he has not having angina with improved hemoglobin, ischemic evaluation with cath is not necessary.

## 2015-04-27 NOTE — Telephone Encounter (Signed)
This is Dr.Klein's pt

## 2015-04-29 ENCOUNTER — Other Ambulatory Visit (INDEPENDENT_AMBULATORY_CARE_PROVIDER_SITE_OTHER): Payer: PPO

## 2015-04-29 ENCOUNTER — Ambulatory Visit (HOSPITAL_COMMUNITY): Admission: RE | Admit: 2015-04-29 | Payer: PPO | Source: Ambulatory Visit | Admitting: Interventional Cardiology

## 2015-04-29 ENCOUNTER — Encounter (HOSPITAL_COMMUNITY): Admission: RE | Payer: Self-pay | Source: Ambulatory Visit

## 2015-04-29 ENCOUNTER — Encounter: Payer: Self-pay | Admitting: Internal Medicine

## 2015-04-29 ENCOUNTER — Ambulatory Visit (INDEPENDENT_AMBULATORY_CARE_PROVIDER_SITE_OTHER): Payer: PPO | Admitting: Internal Medicine

## 2015-04-29 VITALS — BP 124/70 | HR 68 | Temp 98.0°F | Resp 16 | Ht 69.0 in | Wt 219.0 lb

## 2015-04-29 DIAGNOSIS — D500 Iron deficiency anemia secondary to blood loss (chronic): Secondary | ICD-10-CM

## 2015-04-29 DIAGNOSIS — I483 Typical atrial flutter: Secondary | ICD-10-CM | POA: Diagnosis not present

## 2015-04-29 DIAGNOSIS — D509 Iron deficiency anemia, unspecified: Secondary | ICD-10-CM | POA: Insufficient documentation

## 2015-04-29 DIAGNOSIS — Z09 Encounter for follow-up examination after completed treatment for conditions other than malignant neoplasm: Secondary | ICD-10-CM | POA: Diagnosis not present

## 2015-04-29 LAB — CBC WITH DIFFERENTIAL/PLATELET
BASOS ABS: 0.1 10*3/uL (ref 0.0–0.1)
BASOS PCT: 0.7 % (ref 0.0–3.0)
EOS PCT: 3.3 % (ref 0.0–5.0)
Eosinophils Absolute: 0.3 10*3/uL (ref 0.0–0.7)
HEMATOCRIT: 32.2 % — AB (ref 39.0–52.0)
Hemoglobin: 10.3 g/dL — ABNORMAL LOW (ref 13.0–17.0)
LYMPHS ABS: 1.8 10*3/uL (ref 0.7–4.0)
LYMPHS PCT: 21.1 % (ref 12.0–46.0)
MCHC: 32.1 g/dL (ref 30.0–36.0)
MCV: 86.3 fl (ref 78.0–100.0)
MONOS PCT: 6.9 % (ref 3.0–12.0)
Monocytes Absolute: 0.6 10*3/uL (ref 0.1–1.0)
NEUTROS ABS: 5.8 10*3/uL (ref 1.4–7.7)
NEUTROS PCT: 68 % (ref 43.0–77.0)
PLATELETS: 334 10*3/uL (ref 150.0–400.0)
RBC: 3.73 Mil/uL — ABNORMAL LOW (ref 4.22–5.81)
RDW: 15.4 % (ref 11.5–15.5)
WBC: 8.5 10*3/uL (ref 4.0–10.5)

## 2015-04-29 SURGERY — LEFT HEART CATH AND CORONARY ANGIOGRAPHY
Anesthesia: LOCAL

## 2015-04-29 NOTE — Progress Notes (Signed)
Pre visit review using our clinic review tool, if applicable. No additional management support is needed unless otherwise documented below in the visit note. 

## 2015-04-29 NOTE — Patient Instructions (Signed)
Iron Deficiency Anemia, Adult Anemia is a condition in which there are less red blood cells or hemoglobin in the blood than normal. Hemoglobin is the part of red blood cells that carries oxygen. Iron deficiency anemia is anemia caused by too little iron. It is the most common type of anemia. It may leave you tired and short of breath. CAUSES   Lack of iron in the diet.  Poor absorption of iron, as seen with intestinal disorders.  Intestinal bleeding.  Heavy periods. SIGNS AND SYMPTOMS  Mild anemia may not be noticeable. Symptoms may include:  Fatigue.  Headache.  Pale skin.  Weakness.  Tiredness.  Shortness of breath.  Dizziness.  Cold hands and feet.  Fast or irregular heartbeat. DIAGNOSIS  Diagnosis requires a thorough evaluation and physical exam by your health care provider. Blood tests are generally used to confirm iron deficiency anemia. Additional tests may be done to find the underlying cause of your anemia. These may include:  Testing for blood in the stool (fecal occult blood test).  A procedure to see inside the colon and rectum (colonoscopy).  A procedure to see inside the esophagus and stomach (endoscopy). TREATMENT  Iron deficiency anemia is treated by correcting the cause of the deficiency. Treatment may involve:  Adding iron-rich foods to your diet.  Taking iron supplements. Pregnant or breastfeeding women need to take extra iron because their normal diet usually does not provide the required amount.  Taking vitamins. Vitamin C improves the absorption of iron. Your health care provider may recommend that you take your iron tablets with a glass of orange juice or vitamin C supplement.  Medicines to make heavy menstrual flow lighter.  Surgery. HOME CARE INSTRUCTIONS   Take iron as directed by your health care provider.  If you cannot tolerate taking iron supplements by mouth, talk to your health care provider about taking them through a vein  (intravenously) or an injection into a muscle.  For the best iron absorption, iron supplements should be taken on an empty stomach. If you cannot tolerate them on an empty stomach, you may need to take them with food.  Do not drink milk or take antacids at the same time as your iron supplements. Milk and antacids may interfere with the absorption of iron.  Iron supplements can cause constipation. Make sure to include fiber in your diet to prevent constipation. A stool softener may also be recommended.  Take vitamins as directed by your health care provider.  Eat a diet rich in iron. Foods high in iron include liver, lean beef, whole-grain bread, eggs, dried fruit, and dark green leafy vegetables. SEEK IMMEDIATE MEDICAL CARE IF:   You faint. If this happens, do not drive. Call your local emergency services (911 in [place] if no other help is available.  You have chest pain.  You feel nauseous or vomit.  You have severe or increased shortness of breath with activity.  You feel weak.  You have a rapid heartbeat.  You have unexplained sweating.  You become light-headed when getting up from a chair or bed. MAKE SURE YOU:   Understand these instructions.  Will watch your condition.  Will get help right away if you are not doing well or get worse.   This information is not intended to replace advice given to you by your health care provider. Make sure you discuss any questions you have with your health care provider.   Document Released: 04/29/2000 Document Revised: 05/23/2014 Document Reviewed: 01/07/2013 Elsevier   Interactive Patient Education 2016 Elsevier Inc.  

## 2015-04-29 NOTE — Progress Notes (Signed)
Subjective:  Patient ID: Donald [name], male    DOB: 01-29-1933  Age: 81 y.o. MRN: [id number]  CC: Anemia   HPI Donald [name] presents for follow up after a recent admission for anemia that required transfusion, he was found to have an UGI source of blood loss. He feels much better today.  Principal Problem:  Anemia due to blood loss Active Problems:  Essential hypertension  OSA (obstructive sleep apnea)  PAF (paroxysmal atrial fibrillation) (HCC)  Gastritis and gastroduodenitis  UGIB (upper gastrointestinal bleed)  Anticoagulant long-term use  Guaiac positive stools  Outpatient Prescriptions Prior to Visit  Medication Sig Dispense Refill  . atorvastatin (LIPITOR) 40 MG tablet TAKE 1 TABLET ONCE DAILY. 90 tablet 3  . diltiazem (DILACOR XR) 240 MG 24 hr capsule Take 240 mg by mouth daily as needed (increased heart rate).    . ferrous sulfate 325 (65 FE) MG tablet Take 1 tablet (325 mg total) by mouth daily with breakfast. 60 tablet 3  . fluticasone (FLONASE) 50 MCG/ACT nasal spray Place 1 spray into both nostrils daily as needed for allergies or rhinitis. **SHAKE GENTLY**    . [START ON 05/01/2015] furosemide (LASIX) 40 MG tablet Take 1 tablet (40 mg total) by mouth daily. 30 tablet 5  . loratadine (CLARITIN) 10 MG tablet Take 10 mg by mouth daily as needed for allergies.     Marland Kitchen losartan (COZAAR) 100 MG tablet Take 1 tablet (100 mg total) by mouth daily. 90 tablet 3  . Multiple Vitamins-Minerals (EYE VITAMINS PO) Take 1 tablet by mouth 2 (two) times daily. Macular Supplement    . nitroGLYCERIN (NITROSTAT) 0.4 MG SL tablet Place 1 tablet (0.4 mg total) under the tongue every 5 (five) minutes as needed for chest pain. 25 tablet 1  . pantoprazole (PROTONIX) 40 MG tablet Take 1 tablet (40 mg total) by mouth 2 (two) times daily. 60 tablet 1   No facility-administered medications prior to visit.    ROS Review of Systems  Constitutional: Negative.  Negative for fever, chills,  diaphoresis, appetite change and fatigue.  HENT: Negative.  Negative for trouble swallowing.   Eyes: Negative.   Respiratory: Negative.  Negative for cough, choking, chest tightness, shortness of breath and stridor.   Cardiovascular: Negative.  Negative for chest pain, palpitations and leg swelling.  Gastrointestinal: Negative.  Negative for nausea, vomiting, abdominal pain, diarrhea and blood in stool.  Endocrine: Negative.   Genitourinary: Negative.   Musculoskeletal: Negative.   Skin: Negative.  Negative for color change, pallor and rash.  Allergic/Immunologic: Negative.   Neurological: Negative.  Negative for dizziness, syncope, weakness and light-headedness.  Hematological: Negative.  Negative for adenopathy. Does not bruise/bleed easily.  Psychiatric/Behavioral: Negative.     Objective:  BP 124/70 mmHg  Pulse 68  Temp(Src) 98 F (36.7 C) (Oral)  Resp 16  Ht 5\' 9"  (1.753 m)  Wt 219 lb (99.338 kg)  BMI 32.33 kg/m2  SpO2 98%  BP Readings from Last 3 Encounters:  04/29/15 124/70  04/25/15 149/73  04/21/15 110/60    Wt Readings from Last 3 Encounters:  04/29/15 219 lb (99.338 kg)  04/23/15 216 lb (97.977 kg)  04/21/15 222 lb 9.6 oz (100.971 kg)    Physical Exam  Constitutional: He is oriented to person, place, and time. No distress.  HENT:  Head: Normocephalic and atraumatic.  Mouth/Throat: Oropharynx is clear and moist. No oropharyngeal exudate.  Eyes: Conjunctivae are normal. Right eye exhibits no discharge. Left eye exhibits no  discharge. No scleral icterus.  Neck: Normal range of motion. Neck supple. No JVD present. No tracheal deviation present. No thyromegaly present.  Cardiovascular: Normal rate, normal heart sounds and intact distal pulses.  An irregularly irregular rhythm present. Exam reveals no gallop.   No murmur heard. Pulmonary/Chest: Effort normal and breath sounds normal. No stridor. No respiratory distress. He has no wheezes. He has no rales. He  exhibits no tenderness.  Abdominal: Soft. Bowel sounds are normal. He exhibits no distension and no mass. There is no tenderness. There is no rebound and no guarding.  Musculoskeletal: Normal range of motion. He exhibits no edema or tenderness.  Lymphadenopathy:    He has no cervical adenopathy.  Neurological: He is oriented to person, place, and time.  Skin: Skin is warm and dry. No rash noted. He is not diaphoretic. No erythema. No pallor.  Vitals reviewed.   Lab Results  Component Value Date   WBC 8.5 04/29/2015   HGB 10.3* 04/29/2015   HCT 32.2* 04/29/2015   PLT 334.0 04/29/2015   GLUCOSE 103* 04/25/2015   CHOL 121 01/20/2015   TRIG 84.0 01/20/2015   HDL 51.10 01/20/2015   LDLDIRECT 138.8 04/18/2011   LDLCALC 53 01/20/2015   ALT 15* 04/25/2015   AST 16 04/25/2015   NA 140 04/25/2015   K 4.3 04/25/2015   CL 111 04/25/2015   CREATININE 0.99 04/25/2015   BUN 10 04/25/2015   CO2 24 04/25/2015   TSH 0.57 01/20/2015   PSA 0.83 11/20/2013   INR 1.29 04/21/2015    Dg Chest Port 1 View  04/21/2015  CLINICAL DATA:  81 year old male with shortness of breath EXAM: PORTABLE CHEST 1 VIEW COMPARISON:  Chest radiograph dated 07/17/2012 and read radiograph dated 06/26/2014 FINDINGS: Single portable view of the chest does not demonstrate any focal consolidation. There is minimal bibasilar atelectatic changes. Trace fluid may be present in the right minor fissure. There is no pneumothorax. Stable cardiac silhouette. The osseous structures are grossly unremarkable. IMPRESSION: No focal consolidation. Electronically Signed   By: Anner Crete M.D.   On: 04/21/2015 23:01    Assessment & Plan:   Donald [name] was seen today for anemia.  Diagnoses and all orders for this visit:  Anemia due to blood loss- his hemoglobin and hematocrit are up significantly. The upper GI bleeding appears to have ceased. -     CBC with Differential/Platelet; Future  Iron deficiency anemia due to chronic blood  loss- his hemoglobin and hematocrit are improved, I encouraged him to continue with the iron replacement therapy. -     CBC with Differential/Platelet; Future  Typical atrial flutter ([place])- the anemia and blood loss is stabilized. The source of upper GI bleeding appears to have been adequately treated. He will restart Eliquis. -     apixaban (ELIQUIS) 5 MG TABS tablet; Take 1 tablet (5 mg total) by mouth 2 (two) times daily.  I am having Mr. Fouse start on apixaban. I am also having him maintain his loratadine, Multiple Vitamins-Minerals (EYE VITAMINS PO), losartan, fluticasone, diltiazem, nitroGLYCERIN, atorvastatin, ferrous sulfate, furosemide, and pantoprazole.  Meds ordered this encounter  Medications  . apixaban (ELIQUIS) 5 MG TABS tablet    Sig: Take 1 tablet (5 mg total) by mouth 2 (two) times daily.    Dispense:  60 tablet    Refill:  3     Follow-up: Return in about 3 months (around 07/28/2015).  [name], MD

## 2015-04-30 MED ORDER — APIXABAN 5 MG PO TABS: 5.0000 mg | ORAL_TABLET | Freq: Two times a day (BID) | ORAL | Status: DC

## 2015-05-03 ENCOUNTER — Other Ambulatory Visit: Payer: Self-pay | Admitting: Internal Medicine

## 2015-05-04 ENCOUNTER — Encounter: Payer: Self-pay | Admitting: Nurse Practitioner

## 2015-05-04 ENCOUNTER — Ambulatory Visit (INDEPENDENT_AMBULATORY_CARE_PROVIDER_SITE_OTHER): Payer: PPO | Admitting: Nurse Practitioner

## 2015-05-04 ENCOUNTER — Telehealth: Payer: Self-pay | Admitting: *Deleted

## 2015-05-04 VITALS — BP 120/72 | HR 63 | Ht 69.0 in | Wt 219.0 lb

## 2015-05-04 DIAGNOSIS — I483 Typical atrial flutter: Secondary | ICD-10-CM | POA: Diagnosis not present

## 2015-05-04 DIAGNOSIS — I10 Essential (primary) hypertension: Secondary | ICD-10-CM

## 2015-05-04 DIAGNOSIS — R001 Bradycardia, unspecified: Secondary | ICD-10-CM

## 2015-05-04 DIAGNOSIS — I700 Atherosclerosis of aorta: Secondary | ICD-10-CM | POA: Diagnosis not present

## 2015-05-04 DIAGNOSIS — I480 Paroxysmal atrial fibrillation: Secondary | ICD-10-CM

## 2015-05-04 DIAGNOSIS — Z7901 Long term (current) use of anticoagulants: Secondary | ICD-10-CM

## 2015-05-04 LAB — CBC
HCT: 32.5 % — ABNORMAL LOW (ref 39.0–52.0)
Hemoglobin: 10.3 g/dL — ABNORMAL LOW (ref 13.0–17.0)
MCH: 27.8 pg (ref 26.0–34.0)
MCHC: 31.7 g/dL (ref 30.0–36.0)
MCV: 87.6 fL (ref 78.0–100.0)
MPV: 9.9 fL (ref 8.6–12.4)
Platelets: 276 10*3/uL (ref 150–400)
RBC: 3.71 MIL/uL — ABNORMAL LOW (ref 4.22–5.81)
RDW: 15.6 % — ABNORMAL HIGH (ref 11.5–15.5)
WBC: 8.3 10*3/uL (ref 4.0–10.5)

## 2015-05-04 LAB — BASIC METABOLIC PANEL
BUN: 21 mg/dL (ref 7–25)
CO2: 27 mmol/L (ref 20–31)
Calcium: 9.1 mg/dL (ref 8.6–10.3)
Chloride: 106 mmol/L (ref 98–110)
Creat: 1.17 mg/dL — ABNORMAL HIGH (ref 0.70–1.11)
Glucose, Bld: 90 mg/dL (ref 65–99)
Potassium: 5 mmol/L (ref 3.5–5.3)
Sodium: 139 mmol/L (ref 135–146)

## 2015-05-04 NOTE — Telephone Encounter (Signed)
Per Truitt Merle, NP, called pt to let him know that his blood count hasn't changed and it is recommended that pt come back 05/18/14 and have it rechecked.  Order has been put in for CBC w/diff, per Cecille Rubin , and pt verbalized understanding.

## 2015-05-04 NOTE — Telephone Encounter (Signed)
-----   Message from Burtis Junes, NP sent at 05/04/2015  3:40 PM EST ----- Ok to report. Labs are stable. Blood count is the same.  Would favor rechecking CBC in 2 weeks.

## 2015-05-04 NOTE — Progress Notes (Signed)
CARDIOLOGY OFFICE NOTE  Date:  05/04/2015    Donald [name] Date of Birth: 07/03/1932 Medical Record [id number]  PCP:  [name], MD  Cardiologist:  Caryl Comes    Chief Complaint  Patient presents with  . Post hospital for anemia/GI bleed with history of PAF    Seen for Dr. Caryl Comes  . Atrial Fibrillation    History of Present Illness: Donald [name] is a 81 y.o. male who presents today for a post hospital visit. Seen for Dr. Caryl Comes.   He has a h/o paroxsymal atrial flutter with associated tachycardia mediated cardiomyopathy. He has been on chronic anticoagulation with Eliquis. He underwent RFCA by Dr. Caryl Comes 03/2014 and his LV function improved once in normal sinus rthythm. His most recent 2D echo 02/2014 showed normal EF at 55-60%. Also with h/o functionally bicuspid aortic valve, diastolic HF, HTN, HLD, ETOH abuse and skin CA. In 03/2014, he also underwent an EGD to evaluate possible gastric neoplasm. Biopsy demonstrated chronic active gastritis only.In June 2016, the patient complained of chest pain and Dr. Caryl Comes ordered a myoview NST, which was intermediate risk with mild ischemia in the LCx/OM territory. It was decided to pursue medical therapy with PRN nitroglycerin prior to exercise and avoid invasive workup at that time.   He was last seen by Dr. Caryl Comes 12/23/14. He noted improved exercise tolerance. He was no longer having chest discomfort.  Seen by Richardson Dopp earlier this month - endorsed more fatigue and chest pain - cardiac cath was arranged - however - due to pre-procedural lab - noted to have HGB of 7.0. His cath was cancelled. He was admitted for GI consult - HGB dropped to 6.4. FOBT was positive.   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  GI recommended avoidance of NSAIDs for 2 weeks, continue PPI, resume eliquis in 2 days. Colonoscopy showedsemi-pedunculated polyp in the descending colon;  polypectomy was performed with a cold snare. Recommended to see Dr. Pyrtle-consider repeat colonoscopy with MAC or ACBE after 4 weeks to allow polypectomy site healing and then to hold Eliquis until seen back by Cardiology.   Seen by cardiology who recommended keeping HGB above 9. No further inpatient work up needed until HGB above 9. Echo with EF of 55 to 60% and grade 2 diastolic dysfunction.   Comes in today. Here alone. He has restarted his Eliquis. His lab was checked last week - HGB is up to 10.3. No chest pain. Not short of breath. Has gone back to the gym - no problems. Stools are still dark but he remains on iron therapy. He is to see GI in February. He is happy with how he is doing.   Past Medical History  Diagnosis Date  . Hypertension   . Sinus bradycardia   . ED (erectile dysfunction)   . Arthropathy, unspecified, site unspecified   . Prostatitis, unspecified   . Allergic rhinitis due to pollen   . Diverticulosis     severe in descending, sigmoid colon.   . Skin cancer     basal and squamous cell  . Abnormal CT scan, stomach 02/2014    Thickening of gastric fundus and cardia.  gastritis on EGD 03/2014  . Atherosclerosis     a. Noted by abdominal CT 02/2014 (h/o normal nuc 2011).  . Habitual alcohol use   . OSA on CPAP   . Hyperlipidemia   . Obesity   .  Atrial flutter ([place]) 02/24/14    s/p ablation 11/15  . GERD (gastroesophageal reflux disease) 03/2014    small HH and gastritis on EGD  . PAF (paroxysmal atrial fibrillation) ([place])   . Cardiomyopathy ([place])     tachy mediated - a. TEE (10/15):  EF 30%;  b. Echo after NSR restored (10/15):  mild LVH, EF 55-60%, mild AS, mild AI, mild MR, mild to mod LAE, mild RAE  . Esophageal stricture 03/2014    traversable with endoscope. not dilated.     Past Surgical History  Procedure Laterality Date  . Inguinal hernia repair      right  . Inguinal hernia repair      left  . Tonsillectomy    . Orif fracture of the elbow      . Squamous cell carcinoma excision    . Skin cancer excision  05/21/12    Squamous cell ca  . Hernia repair    . Tee without cardioversion N/A 02/24/2014    Procedure: TRANSESOPHAGEAL ECHOCARDIOGRAM (TEE);  Surgeon: Lelon Perla, MD;  Location: [facility] ENDOSCOPY;  Service: Cardiovascular;  Laterality: N/A;  . Cardioversion N/A 02/24/2014    Procedure: CARDIOVERSION;  Surgeon: Lelon Perla, MD;  Location: [facility] ENDOSCOPY;  Service: Cardiovascular;  Laterality: N/A;  . Ablation  03/19/2014    RFCA of atrial flutter by Dr Caryl Comes  . Esophagogastroduodenoscopy N/A 04/09/2014    Procedure: ESOPHAGOGASTRODUODENOSCOPY (EGD);  Surgeon: Inda Castle, MD;  Location: [facility];  Service: Endoscopy;  Laterality: N/A;  . Atrial flutter ablation N/A 03/19/2014    Procedure: ATRIAL FLUTTER ABLATION;  Surgeon: Deboraha Sprang, MD;  Location[facility] CATH LAB;  Service: Cardiovascular;  Laterality: N/A;  . Cardioversion Right 09/10/2014    Procedure: CARDIOVERSION;  Surgeon: Deboraha Sprang, MD;  Location: [facility] CATH LAB;  Service: Cardiovascular;  Laterality: Right;  . Colonoscopy N/A 04/23/2015    Procedure: COLONOSCOPY;  Surgeon: Ladene Artist, MD;  Location: [facility] ENDOSCOPY;  Service: Endoscopy;  Laterality: N/A;  . Esophagogastroduodenoscopy N/A 04/23/2015    Procedure: ESOPHAGOGASTRODUODENOSCOPY (EGD);  Surgeon: Ladene Artist, MD;  Location: [facility] ENDOSCOPY;  Service: Endoscopy;  Laterality: N/A;     Medications: Current Outpatient Prescriptions  Medication Sig Dispense Refill  . apixaban (ELIQUIS) 5 MG TABS tablet Take 1 tablet (5 mg total) by mouth 2 (two) times daily. 60 tablet 3  . atorvastatin (LIPITOR) 40 MG tablet TAKE 1 TABLET ONCE DAILY. 90 tablet 3  . diltiazem (DILACOR XR) 240 MG 24 hr capsule Take 240 mg by mouth daily as needed (increased heart rate).    . ferrous sulfate 325 (65 FE) MG tablet Take 1 tablet (325 mg total) by mouth daily with breakfast. 60 tablet 3  . fluticasone (FLONASE) 50  MCG/ACT nasal spray Place 1 spray into both nostrils daily as needed for allergies or rhinitis. **SHAKE GENTLY**    . furosemide (LASIX) 40 MG tablet Take 1 tablet (40 mg total) by mouth daily. 30 tablet 5  . loratadine (CLARITIN) 10 MG tablet Take 10 mg by mouth daily as needed for allergies.     Marland Kitchen losartan (COZAAR) 100 MG tablet Take 1 tablet (100 mg total) by mouth daily. 90 tablet 3  . Multiple Vitamins-Minerals (EYE VITAMINS PO) Take 1 tablet by mouth 2 (two) times daily. Macular Supplement    . nitroGLYCERIN (NITROSTAT) 0.4 MG SL tablet Place 1 tablet (0.4 mg total) under the tongue every 5 (five) minutes as needed for chest pain.  25 tablet 1  . pantoprazole (PROTONIX) 40 MG tablet Take 1 tablet (40 mg total) by mouth 2 (two) times daily. 60 tablet 1   No current facility-administered medications for this visit.    Allergies: No Known Allergies  Social History: The patient  reports that he quit smoking about 6 years ago. His smoking use included Cigarettes and Cigars. He smoked 0.00 packs per day for 40 years. His smokeless tobacco use includes Chew. He reports that he drinks about 12.6 oz of alcohol per week. He reports that he does not use illicit drugs.   Family History: The patient's family history includes Cancer in his brother and mother; Other in his father; Pulmonary fibrosis in his brother. There is no history of Diabetes, Coronary artery disease, Colon cancer, Stomach cancer, or Rectal cancer.   Review of Systems: Please see the history of present illness.   Otherwise, the review of systems is positive for none.   All other systems are reviewed and negative.   Physical Exam: VS:  BP 120/72 mmHg  Pulse 63  Ht 5\' 9"  (1.753 m)  Wt 219 lb (99.338 kg)  BMI 32.33 kg/m2  SpO2 99% .  BMI Body mass index is 32.33 kg/(m^2).  Wt Readings from Last 3 Encounters:  05/04/15 219 lb (99.338 kg)  04/29/15 219 lb (99.338 kg)  04/23/15 216 lb (97.977 kg)    General: Pleasant. Well  developed, well nourished and in no acute distress.  HEENT: Normal. Neck: Supple, no JVD, carotid bruits, or masses noted.  Cardiac: Regular rate and rhythm. Very soft outflow murmur. No edema.  Respiratory:  Lungs are clear to auscultation bilaterally with normal work of breathing.  GI: Soft and nontender.  MS: No deformity or atrophy. Gait and ROM intact. Skin: Warm and dry. Color is normal.  Neuro:  Strength and sensation are intact and no gross focal deficits noted.  Psych: Alert, appropriate and with normal affect.   LABORATORY DATA:  EKG:  EKG is ordered today. This demonstrates NSR with a rate of 63.   Lab Results  Component Value Date   WBC 8.5 04/29/2015   HGB 10.3* 04/29/2015   HCT 32.2* 04/29/2015   PLT 334.0 04/29/2015   GLUCOSE 103* 04/25/2015   CHOL 121 01/20/2015   TRIG 84.0 01/20/2015   HDL 51.10 01/20/2015   LDLDIRECT 138.8 04/18/2011   LDLCALC 53 01/20/2015   ALT 15* 04/25/2015   AST 16 04/25/2015   NA 140 04/25/2015   K 4.3 04/25/2015   CL 111 04/25/2015   CREATININE 0.99 04/25/2015   BUN 10 04/25/2015   CO2 24 04/25/2015   TSH 0.57 01/20/2015   PSA 0.83 11/20/2013   INR 1.29 04/21/2015    BNP (last 3 results) No results for input(s): BNP in the last 8760 hours.  ProBNP (last 3 results) No results for input(s): PROBNP in the last 8760 hours.   Other Studies Reviewed Today:  Echocardiogram 04/24/2015: Study Conclusions  - Left ventricle: The cavity size was normal. There was moderate concentric hypertrophy. Systolic function was normal. The estimated ejection fraction was in the range of 55% to 60%. Wall motion was normal; there were no regional wall motion abnormalities. Features are consistent with a pseudonormal left ventricular filling pattern, with concomitant abnormal relaxation and increased filling pressure (grade 2 diastolic dysfunction). Doppler parameters are consistent with elevated ventricular end-diastolic  filling pressure. - Aortic valve: A thickening and calcification of the right coronary leaflet that is uncahnged from the prior  study. There was mild stenosis. There was mild to moderate regurgitation. - Aortic root: The aortic root was normal in size. - Ascending aorta: The ascending aorta was normal in size. - Mitral valve: Structurally normal valve. There was mild regurgitation. - Left atrium: The atrium was moderately dilated. - Right ventricle: Systolic function was normal. - Right atrium: The atrium was normal in size. - Tricuspid valve: There was mild regurgitation. - Pulmonary arteries: The main pulmonary artery was normal-sized. Systolic pressure was mildly increased. PA peak pressure: 35 mm Hg (S). - Line: A venous catheter was visualized in the superior vena cava, with its tip in the right atrium. No abnormal features noted. - Inferior vena cava: The vessel was dilated. The respirophasic diameter changes were blunted (< 50%), consistent with elevated central venous pressure. - Pericardium, extracardiac: There was no pericardial effusion.   Myoview Study Highlights from 10/2014     This is an intermediate risk study with a small area, mild severity reversible defect in the LCX/OM territory (SDS 3).    Assessment/Plan: 1. Exertional angina in the setting of severe anemia. Troponin I 0.07. ECG without acute ST segment changes. His symptoms are now totally resolved with treatment of his anemia. Would continue with medical management for now.   2. PAF/flutter, maintaining sinus rhythm. Prior ablation. History of tachycardia-mediated cardiomyopathy with subsequent normalization of LVEFCHADSVASC score is 4. Eliquis has been restarted.   3. Iron deficiency anemia. EGD on 12/8 showed oozing gastric body AVM which was treated with ablation. Also had descending colonic polyp removed by colonoscopy. To consider repeat colonoscopy after healing - defer to GI.  Recommendation is to hold off on resuming Eliquis until hemoglobin is stable at or above 9.  4. OSA on CPAP.  5. CKD, stage 3. Creatinine currently normal.  6. Essential hypertension, on Cozaar.  7. Abnormal Myoview - would favor medical management for now - keep close watch on his blood count.   8. Aortic Stenosis: Functionally bicuspid AV - recent echo noted - no cardinal symptoms noted.      Current medicines are reviewed with the patient today.  The patient does not have concerns regarding medicines other than what has been noted above.  The following changes have been made:  See above.  Labs/ tests ordered today include:    Orders Placed This Encounter  Procedures  . Basic metabolic panel  . CBC  . EKG 12-Lead     Disposition:   FU with Dr. Caryl Comes as planned.   Patient is agreeable to this plan and will call if any problems develop in the interim.   Signed: Burtis Junes, RN, ANP-C 05/04/2015 9:52 AM  [place] [address] [place], [place]  [address] Phone: [phone] Fax: [phone]

## 2015-05-04 NOTE — Patient Instructions (Addendum)
We will be checking the following labs today - BMET, CBC    Medication Instructions:    Continue with your current medicines.     Testing/Procedures To Be Arranged:  N/A  Follow-Up:   See Dr. Caryl Comes in February as planned.     Other Special Instructions:   N/A    If you need a refill on your cardiac medications before your next appointment, please call your pharmacy.   Call the [place] office at [phone] if you have any questions, problems or concerns.

## 2015-05-19 ENCOUNTER — Other Ambulatory Visit (INDEPENDENT_AMBULATORY_CARE_PROVIDER_SITE_OTHER): Payer: PPO | Admitting: *Deleted

## 2015-05-19 ENCOUNTER — Other Ambulatory Visit: Payer: PPO

## 2015-05-19 DIAGNOSIS — I10 Essential (primary) hypertension: Secondary | ICD-10-CM | POA: Diagnosis not present

## 2015-05-19 DIAGNOSIS — I700 Atherosclerosis of aorta: Secondary | ICD-10-CM | POA: Diagnosis not present

## 2015-05-19 DIAGNOSIS — R001 Bradycardia, unspecified: Secondary | ICD-10-CM | POA: Diagnosis not present

## 2015-05-19 LAB — CBC WITH DIFFERENTIAL/PLATELET
Basophils Absolute: 0 10*3/uL (ref 0.0–0.1)
Basophils Relative: 0 % (ref 0–1)
Eosinophils Absolute: 0.2 10*3/uL (ref 0.0–0.7)
Eosinophils Relative: 2 % (ref 0–5)
HCT: 35.7 % — ABNORMAL LOW (ref 39.0–52.0)
Hemoglobin: 11.7 g/dL — ABNORMAL LOW (ref 13.0–17.0)
Lymphocytes Relative: 24 % (ref 12–46)
Lymphs Abs: 2 10*3/uL (ref 0.7–4.0)
MCH: 28.8 pg (ref 26.0–34.0)
MCHC: 32.8 g/dL (ref 30.0–36.0)
MCV: 87.9 fL (ref 78.0–100.0)
MPV: 10.2 fL (ref 8.6–12.4)
Monocytes Absolute: 0.9 10*3/uL (ref 0.1–1.0)
Monocytes Relative: 11 % (ref 3–12)
Neutro Abs: 5.2 10*3/uL (ref 1.7–7.7)
Neutrophils Relative %: 63 % (ref 43–77)
Platelets: 238 10*3/uL (ref 150–400)
RBC: 4.06 MIL/uL — ABNORMAL LOW (ref 4.22–5.81)
RDW: 17.1 % — ABNORMAL HIGH (ref 11.5–15.5)
WBC: 8.2 10*3/uL (ref 4.0–10.5)

## 2015-05-19 NOTE — Addendum Note (Signed)
Addended by: Eulis Foster on: 05/19/2015 01:08 PM   Modules accepted: Orders

## 2015-05-21 ENCOUNTER — Telehealth: Payer: Self-pay | Admitting: *Deleted

## 2015-05-21 ENCOUNTER — Encounter: Payer: Self-pay | Admitting: *Deleted

## 2015-05-21 ENCOUNTER — Encounter: Payer: Self-pay | Admitting: Internal Medicine

## 2015-05-21 NOTE — Telephone Encounter (Signed)
-----   Message from Burtis Junes, NP sent at 05/20/2015  7:36 AM EST ----- Ok to report. Blood count continues to improve.  Recheck CBC in one month.

## 2015-05-21 NOTE — Telephone Encounter (Signed)
Called pt, per Truitt Merle, NP, to advise the pt that his blood count was improving and that we just needed to recheck it in 1 month.  Pt advised that he has a f/u apt with his Gastroenterologist 06/18/15 and will have them recheck CBC at that time.  Pt was advised that if they needed any information, to have them call us.  Pt verbalized understanding.

## 2015-05-28 ENCOUNTER — Ambulatory Visit (INDEPENDENT_AMBULATORY_CARE_PROVIDER_SITE_OTHER): Payer: PPO | Admitting: Internal Medicine

## 2015-05-28 ENCOUNTER — Encounter: Payer: Self-pay | Admitting: Internal Medicine

## 2015-05-28 VITALS — BP 140/74 | HR 75 | Temp 98.3°F | Ht 69.0 in | Wt 219.0 lb

## 2015-05-28 DIAGNOSIS — I10 Essential (primary) hypertension: Secondary | ICD-10-CM | POA: Diagnosis not present

## 2015-05-28 DIAGNOSIS — I483 Typical atrial flutter: Secondary | ICD-10-CM

## 2015-05-28 DIAGNOSIS — J019 Acute sinusitis, unspecified: Secondary | ICD-10-CM

## 2015-05-28 MED ORDER — LEVOFLOXACIN 250 MG PO TABS: 250.0000 mg | ORAL_TABLET | Freq: Every day | ORAL | Status: DC

## 2015-05-28 MED ORDER — HYDROCODONE-HOMATROPINE 5-1.5 MG/5ML PO SYRP: 5.0000 mL | ORAL_SOLUTION | Freq: Four times a day (QID) | ORAL | Status: DC | PRN

## 2015-05-28 NOTE — Progress Notes (Signed)
Subjective:    Patient ID: Donald [name], male    DOB: [birth year], 82 y.o.   MRN: [id number]  HPI   Here with 2-3 days acute onset fever, facial pain, pressure, headache, general weakness and malaise, and greenish d/c, with mild ST and cough, but pt denies chest pain, wheezing, increased sob or doe, orthopnea, PND, increased LE swelling, palpitations, dizziness or syncope. Pt denies new neurological symptoms such as new headache, or facial or extremity weakness or numbness   Pt denies polydipsia, polyuria.   Past Medical History  Diagnosis Date  . Hypertension   . Sinus bradycardia   . ED (erectile dysfunction)   . Arthropathy, unspecified, site unspecified   . Prostatitis, unspecified   . Allergic rhinitis due to pollen   . Diverticulosis     severe in descending, sigmoid colon.   . Skin cancer     basal and squamous cell  . Abnormal CT scan, stomach 02/2014    Thickening of gastric fundus and cardia.  gastritis on EGD 03/2014  . Atherosclerosis     a. Noted by abdominal CT 02/2014 (h/o normal nuc 2011).  . Habitual alcohol use   . OSA on CPAP   . Hyperlipidemia   . Obesity   . Atrial flutter ([place]) 02/24/14    s/p ablation 11/15  . GERD (gastroesophageal reflux disease) 03/2014    small HH and gastritis on EGD  . PAF (paroxysmal atrial fibrillation) ([place])   . Cardiomyopathy ([place])     tachy mediated - a. TEE (10/15):  EF 30%;  b. Echo after NSR restored (10/15):  mild LVH, EF 55-60%, mild AS, mild AI, mild MR, mild to mod LAE, mild RAE  . Esophageal stricture 03/2014    traversable with endoscope. not dilated.   . Gastric AVM   . Esophageal stricture   . Hemorrhoids   . GI bleed    Past Surgical History  Procedure Laterality Date  . Inguinal hernia repair      right  . Inguinal hernia repair      left  . Tonsillectomy    . Orif fracture of the elbow    . Squamous cell carcinoma excision    . Skin cancer excision  05/21/12    Squamous cell ca  . Hernia repair    .  Tee without cardioversion N/A 02/24/2014    Procedure: TRANSESOPHAGEAL ECHOCARDIOGRAM (TEE);  Surgeon: Lelon Perla, MD;  Location: [facility] ENDOSCOPY;  Service: Cardiovascular;  Laterality: N/A;  . Cardioversion N/A 02/24/2014    Procedure: CARDIOVERSION;  Surgeon: Lelon Perla, MD;  Location: [facility] ENDOSCOPY;  Service: Cardiovascular;  Laterality: N/A;  . Ablation  03/19/2014    RFCA of atrial flutter by Dr Caryl Comes  . Esophagogastroduodenoscopy N/A 04/09/2014    Procedure: ESOPHAGOGASTRODUODENOSCOPY (EGD);  Surgeon: Inda Castle, MD;  Location: [place];  Service: Endoscopy;  Laterality: N/A;  . Atrial flutter ablation N/A 03/19/2014    Procedure: ATRIAL FLUTTER ABLATION;  Surgeon: Deboraha Sprang, MD;  Location: [facility] CATH LAB;  Service: Cardiovascular;  Laterality: N/A;  . Cardioversion Right 09/10/2014    Procedure: CARDIOVERSION;  Surgeon: Deboraha Sprang, MD;  Location: [facility] CATH LAB;  Service: Cardiovascular;  Laterality: Right;  . Colonoscopy N/A 04/23/2015    Procedure: COLONOSCOPY;  Surgeon: Ladene Artist, MD;  Location: [facility] ENDOSCOPY;  Service: Endoscopy;  Laterality: N/A;  . Esophagogastroduodenoscopy N/A 04/23/2015    Procedure: ESOPHAGOGASTRODUODENOSCOPY (EGD);  Surgeon: Ladene Artist, MD;  Location:  [place] ENDOSCOPY;  Service: Endoscopy;  Laterality: N/A;    reports that he quit smoking about 6 years ago. His smoking use included Cigarettes and Cigars. He smoked 0.00 packs per day for 40 years. His smokeless tobacco use includes Chew. He reports that he drinks about 12.6 oz of alcohol per week. He reports that he does not use illicit drugs. family history includes Cancer in his brother and mother; Other in his father; Pulmonary fibrosis in his brother. There is no history of Diabetes, Coronary artery disease, Colon cancer, Stomach cancer, or Rectal cancer. No Known Allergies Current Outpatient Prescriptions on File Prior to Visit  Medication Sig Dispense Refill  . apixaban (ELIQUIS) 5  MG TABS tablet Take 1 tablet (5 mg total) by mouth 2 (two) times daily. 60 tablet 3  . atorvastatin (LIPITOR) 40 MG tablet TAKE 1 TABLET ONCE DAILY. 90 tablet 3  . diltiazem (DILACOR XR) 240 MG 24 hr capsule Take 240 mg by mouth daily as needed (increased heart rate).    . ferrous sulfate 325 (65 FE) MG tablet Take 1 tablet (325 mg total) by mouth daily with breakfast. 60 tablet 3  . fluticasone (FLONASE) 50 MCG/ACT nasal spray Place 1 spray into both nostrils daily as needed for allergies or rhinitis. **SHAKE GENTLY**    . furosemide (LASIX) 40 MG tablet Take 1 tablet (40 mg total) by mouth daily. 30 tablet 5  . loratadine (CLARITIN) 10 MG tablet Take 10 mg by mouth daily as needed for allergies.     Marland Kitchen losartan (COZAAR) 100 MG tablet TAKE 1 TABLET DAILY. 90 tablet 3  . Multiple Vitamins-Minerals (EYE VITAMINS PO) Take 1 tablet by mouth 2 (two) times daily. Macular Supplement    . nitroGLYCERIN (NITROSTAT) 0.4 MG SL tablet Place 1 tablet (0.4 mg total) under the tongue every 5 (five) minutes as needed for chest pain. 25 tablet 1  . pantoprazole (PROTONIX) 40 MG tablet Take 1 tablet (40 mg total) by mouth 2 (two) times daily. 60 tablet 1   No current facility-administered medications on file prior to visit.   Review of Systems  Constitutional: Negative for unusual diaphoresis or night sweats HENT: Negative for ringing in ear or discharge Eyes: Negative for double vision or worsening visual disturbance.  Respiratory: Negative for choking and stridor.   Gastrointestinal: Negative for vomiting or other signifcant bowel change Genitourinary: Negative for hematuria or change in urine volume.  Musculoskeletal: Negative for other MSK pain or swelling Skin: Negative for color change and worsening wound.  Neurological: Negative for tremors and numbness other than noted  Psychiatric/Behavioral: Negative for decreased concentration or agitation other than above       Objective:   Physical Exam BP  140/74 mmHg  Pulse 75  Temp(Src) 98.3 F (36.8 C) (Oral)  Ht 5\' 9"  (1.753 m)  Wt 219 lb (99.338 kg)  BMI 32.33 kg/m2  SpO2 97% VS noted, mild ill Constitutional: Pt appears in no significant distress HENT: Head: NCAT.  Right Ear: External ear normal.  Left Ear: External ear normal.  Bilat tm's with mild erythema.  Max sinus areas mild tender left maxillary > right.  Pharynx with mild erythema, no exudate Eyes: . Pupils are equal, round, and reactive to light. Conjunctivae and EOM are normal Neck: Normal range of motion. Neck supple.  Cardiovascular: Normal rate and regular rhythm.   Pulmonary/Chest: Effort normal and breath sounds without rales or wheezing.  Neurological: Pt is alert. Not confused , motor grossly intact Skin:  Skin is warm. No rash, no LE edema Psychiatric: Pt behavior is normal. No agitation.     Assessment & Plan:

## 2015-05-28 NOTE — Assessment & Plan Note (Signed)
Borderline elev today likely situatoinal, ok to follow, o/w stable overall by history and exam, recent data reviewed with pt, and pt to continue medical treatment as before,  to f/u any worsening symptoms or concerns BP Readings from Last 3 Encounters:  05/28/15 140/74  05/04/15 120/72  04/29/15 124/70

## 2015-05-28 NOTE — Assessment & Plan Note (Signed)
Mild to mod, for antibx course,  to f/u any worsening symptoms or concerns, for cough med prn,  to f/u any worsening symptoms or concerns

## 2015-05-28 NOTE — Assessment & Plan Note (Signed)
Asympt, RRR today, volume stable, cont eliquis and current tx,  to f/u any worsening symptoms or concerns

## 2015-05-28 NOTE — Patient Instructions (Signed)
Please take all new medication as prescribed - the antibiotic, and cough medicine as needed  Please continue all other medications as before, and refills have been done if requested.  Please have the pharmacy call with any other refills you may need.  Please keep your appointments with your specialists as you may have planned   

## 2015-05-28 NOTE — Progress Notes (Signed)
Pre visit review using our clinic review tool, if applicable. No additional management support is needed unless otherwise documented below in the visit note. 

## 2015-05-29 DIAGNOSIS — Z85828 Personal history of other malignant neoplasm of skin: Secondary | ICD-10-CM | POA: Diagnosis not present

## 2015-05-29 DIAGNOSIS — L821 Other seborrheic keratosis: Secondary | ICD-10-CM | POA: Diagnosis not present

## 2015-05-29 DIAGNOSIS — L918 Other hypertrophic disorders of the skin: Secondary | ICD-10-CM | POA: Diagnosis not present

## 2015-05-29 DIAGNOSIS — L910 Hypertrophic scar: Secondary | ICD-10-CM | POA: Diagnosis not present

## 2015-05-29 DIAGNOSIS — L814 Other melanin hyperpigmentation: Secondary | ICD-10-CM | POA: Diagnosis not present

## 2015-05-29 DIAGNOSIS — D485 Neoplasm of uncertain behavior of skin: Secondary | ICD-10-CM | POA: Diagnosis not present

## 2015-05-29 DIAGNOSIS — L570 Actinic keratosis: Secondary | ICD-10-CM | POA: Diagnosis not present

## 2015-05-29 DIAGNOSIS — L718 Other rosacea: Secondary | ICD-10-CM | POA: Diagnosis not present

## 2015-05-29 DIAGNOSIS — D225 Melanocytic nevi of trunk: Secondary | ICD-10-CM | POA: Diagnosis not present

## 2015-06-03 DIAGNOSIS — G4733 Obstructive sleep apnea (adult) (pediatric): Secondary | ICD-10-CM | POA: Diagnosis not present

## 2015-06-10 ENCOUNTER — Ambulatory Visit (INDEPENDENT_AMBULATORY_CARE_PROVIDER_SITE_OTHER): Payer: PPO | Admitting: Cardiology

## 2015-06-10 ENCOUNTER — Inpatient Hospital Stay (HOSPITAL_COMMUNITY)
Admission: EM | Admit: 2015-06-10 | Discharge: 2015-06-13 | DRG: 378 | Disposition: A | Discharge status: Home / Self Care | Payer: PPO | Attending: Internal Medicine | Admitting: Internal Medicine

## 2015-06-10 ENCOUNTER — Encounter: Payer: Self-pay | Admitting: Cardiology

## 2015-06-10 VITALS — BP 110/62 | HR 74 | Ht 69.0 in | Wt 218.1 lb

## 2015-06-10 DIAGNOSIS — R195 Other fecal abnormalities: Secondary | ICD-10-CM | POA: Diagnosis not present

## 2015-06-10 DIAGNOSIS — K921 Melena: Secondary | ICD-10-CM | POA: Diagnosis present

## 2015-06-10 DIAGNOSIS — I200 Unstable angina: Secondary | ICD-10-CM | POA: Diagnosis not present

## 2015-06-10 DIAGNOSIS — Z85828 Personal history of other malignant neoplasm of skin: Secondary | ICD-10-CM | POA: Diagnosis not present

## 2015-06-10 DIAGNOSIS — Z8042 Family history of malignant neoplasm of prostate: Secondary | ICD-10-CM | POA: Diagnosis not present

## 2015-06-10 DIAGNOSIS — M129 Arthropathy, unspecified: Secondary | ICD-10-CM | POA: Diagnosis present

## 2015-06-10 DIAGNOSIS — I471 Supraventricular tachycardia: Secondary | ICD-10-CM | POA: Diagnosis not present

## 2015-06-10 DIAGNOSIS — D508 Other iron deficiency anemias: Secondary | ICD-10-CM | POA: Diagnosis present

## 2015-06-10 DIAGNOSIS — E669 Obesity, unspecified: Secondary | ICD-10-CM | POA: Diagnosis present

## 2015-06-10 DIAGNOSIS — K449 Diaphragmatic hernia without obstruction or gangrene: Secondary | ICD-10-CM | POA: Diagnosis not present

## 2015-06-10 DIAGNOSIS — Z8041 Family history of malignant neoplasm of ovary: Secondary | ICD-10-CM

## 2015-06-10 DIAGNOSIS — Z7901 Long term (current) use of anticoagulants: Secondary | ICD-10-CM | POA: Diagnosis not present

## 2015-06-10 DIAGNOSIS — I480 Paroxysmal atrial fibrillation: Secondary | ICD-10-CM | POA: Diagnosis not present

## 2015-06-10 DIAGNOSIS — G4733 Obstructive sleep apnea (adult) (pediatric): Secondary | ICD-10-CM | POA: Diagnosis not present

## 2015-06-10 DIAGNOSIS — E785 Hyperlipidemia, unspecified: Secondary | ICD-10-CM | POA: Diagnosis present

## 2015-06-10 DIAGNOSIS — D649 Anemia, unspecified: Secondary | ICD-10-CM | POA: Diagnosis not present

## 2015-06-10 DIAGNOSIS — K219 Gastro-esophageal reflux disease without esophagitis: Secondary | ICD-10-CM | POA: Diagnosis present

## 2015-06-10 DIAGNOSIS — I10 Essential (primary) hypertension: Secondary | ICD-10-CM | POA: Diagnosis not present

## 2015-06-10 DIAGNOSIS — N529 Male erectile dysfunction, unspecified: Secondary | ICD-10-CM | POA: Diagnosis not present

## 2015-06-10 DIAGNOSIS — Z87891 Personal history of nicotine dependence: Secondary | ICD-10-CM | POA: Diagnosis not present

## 2015-06-10 DIAGNOSIS — K2961 Other gastritis with bleeding: Principal | ICD-10-CM | POA: Diagnosis present

## 2015-06-10 DIAGNOSIS — Z86010 Personal history of colonic polyps: Secondary | ICD-10-CM

## 2015-06-10 DIAGNOSIS — D500 Iron deficiency anemia secondary to blood loss (chronic): Secondary | ICD-10-CM

## 2015-06-10 DIAGNOSIS — Z6832 Body mass index (BMI) 32.0-32.9, adult: Secondary | ICD-10-CM

## 2015-06-10 DIAGNOSIS — K922 Gastrointestinal hemorrhage, unspecified: Secondary | ICD-10-CM | POA: Diagnosis not present

## 2015-06-10 DIAGNOSIS — I429 Cardiomyopathy, unspecified: Secondary | ICD-10-CM | POA: Diagnosis present

## 2015-06-10 DIAGNOSIS — Z79899 Other long term (current) drug therapy: Secondary | ICD-10-CM

## 2015-06-10 DIAGNOSIS — R531 Weakness: Secondary | ICD-10-CM | POA: Diagnosis not present

## 2015-06-10 DIAGNOSIS — K222 Esophageal obstruction: Secondary | ICD-10-CM | POA: Diagnosis present

## 2015-06-10 DIAGNOSIS — K648 Other hemorrhoids: Secondary | ICD-10-CM | POA: Diagnosis not present

## 2015-06-10 DIAGNOSIS — D62 Acute posthemorrhagic anemia: Secondary | ICD-10-CM | POA: Diagnosis not present

## 2015-06-10 DIAGNOSIS — I483 Typical atrial flutter: Secondary | ICD-10-CM

## 2015-06-10 LAB — COMPREHENSIVE METABOLIC PANEL
ALT: 16 U/L — AB (ref 17–63)
AST: 23 U/L (ref 15–41)
Albumin: 3.7 g/dL (ref 3.5–5.0)
Alkaline Phosphatase: 64 U/L (ref 38–126)
Anion gap: 10 (ref 5–15)
BUN: 22 mg/dL — ABNORMAL HIGH (ref 6–20)
CHLORIDE: 107 mmol/L (ref 101–111)
CO2: 24 mmol/L (ref 22–32)
CREATININE: 1.43 mg/dL — AB (ref 0.61–1.24)
Calcium: 9 mg/dL (ref 8.9–10.3)
GFR calc non Af Amer: 45 mL/min — ABNORMAL LOW (ref >60)
GFR, EST AFRICAN AMERICAN: 52 mL/min — AB (ref >60)
Glucose, Bld: 112 mg/dL — ABNORMAL HIGH (ref 65–99)
Potassium: 4.4 mmol/L (ref 3.5–5.1)
SODIUM: 141 mmol/L (ref 135–145)
Total Bilirubin: 0.3 mg/dL (ref 0.3–1.2)
Total Protein: 6.4 g/dL — ABNORMAL LOW (ref 6.5–8.1)

## 2015-06-10 LAB — CBC WITH DIFFERENTIAL/PLATELET
BASOS PCT: 0 % (ref 0–1)
Basophils Absolute: 0 10*3/uL (ref 0.0–0.1)
EOS ABS: 0.1 10*3/uL (ref 0.0–0.7)
EOS PCT: 2 % (ref 0–5)
HCT: 23.5 % — ABNORMAL LOW (ref 39.0–52.0)
Hemoglobin: 7.3 g/dL — ABNORMAL LOW (ref 13.0–17.0)
LYMPHS ABS: 1.2 10*3/uL (ref 0.7–4.0)
Lymphocytes Relative: 17 % (ref 12–46)
MCH: 28.2 pg (ref 26.0–34.0)
MCHC: 31.1 g/dL (ref 30.0–36.0)
MCV: 90.7 fL (ref 78.0–100.0)
MONOS PCT: 8 % (ref 3–12)
MPV: 9.1 fL (ref 8.6–12.4)
Monocytes Absolute: 0.6 10*3/uL (ref 0.1–1.0)
Neutro Abs: 5.3 10*3/uL (ref 1.7–7.7)
Neutrophils Relative %: 73 % (ref 43–77)
PLATELETS: 415 10*3/uL — AB (ref 150–400)
RBC: 2.59 MIL/uL — ABNORMAL LOW (ref 4.22–5.81)
RDW: 17.7 % — AB (ref 11.5–15.5)
WBC: 7.2 10*3/uL (ref 4.0–10.5)

## 2015-06-10 LAB — CBC
HEMATOCRIT: 22.3 % — AB (ref 39.0–52.0)
HEMOGLOBIN: 6.8 g/dL — AB (ref 13.0–17.0)
MCH: 28.7 pg (ref 26.0–34.0)
MCHC: 30.5 g/dL (ref 30.0–36.0)
MCV: 94.1 fL (ref 78.0–100.0)
PLATELETS: 376 10*3/uL (ref 150–400)
RBC: 2.37 MIL/uL — AB (ref 4.22–5.81)
RDW: 18.8 % — ABNORMAL HIGH (ref 11.5–15.5)
WBC: 7.3 10*3/uL (ref 4.0–10.5)

## 2015-06-10 LAB — BASIC METABOLIC PANEL
BUN: 21 mg/dL (ref 7–25)
CALCIUM: 8.7 mg/dL (ref 8.6–10.3)
CHLORIDE: 106 mmol/L (ref 98–110)
CO2: 23 mmol/L (ref 20–31)
CREATININE: 1.04 mg/dL (ref 0.70–1.11)
Glucose, Bld: 107 mg/dL — ABNORMAL HIGH (ref 65–99)
Potassium: 4.4 mmol/L (ref 3.5–5.3)
Sodium: 137 mmol/L (ref 135–146)

## 2015-06-10 LAB — ABO/RH: ABO/RH(D): O NEG

## 2015-06-10 LAB — POC OCCULT BLOOD, ED: FECAL OCCULT BLD: POSITIVE — AB

## 2015-06-10 LAB — PREPARE RBC (CROSSMATCH)

## 2015-06-10 MED ORDER — ATORVASTATIN CALCIUM 40 MG PO TABS
40.0000 mg | ORAL_TABLET | Freq: Every evening | ORAL | Status: DC
  Administered 2015-06-11 – 2015-06-12 (×3): 40 mg via ORAL
  Filled 2015-06-10 (×3): qty 1

## 2015-06-10 MED ORDER — PANTOPRAZOLE SODIUM 40 MG IV SOLR
40.0000 mg | Freq: Once | INTRAVENOUS | Status: AC
  Administered 2015-06-10: 40 mg via INTRAVENOUS
  Filled 2015-06-10: qty 40

## 2015-06-10 MED ORDER — ISOSORBIDE MONONITRATE ER 30 MG PO TB24
30.0000 mg | ORAL_TABLET | Freq: Every day | ORAL | Status: DC
  Administered 2015-06-11 – 2015-06-13 (×3): 30 mg via ORAL
  Filled 2015-06-10 (×3): qty 1

## 2015-06-10 MED ORDER — SODIUM CHLORIDE 0.9 % IV BOLUS (SEPSIS)
1000.0000 mL | Freq: Once | INTRAVENOUS | Status: AC
  Administered 2015-06-10: 1000 mL via INTRAVENOUS

## 2015-06-10 MED ORDER — PANTOPRAZOLE SODIUM 40 MG PO TBEC
40.0000 mg | DELAYED_RELEASE_TABLET | Freq: Two times a day (BID) | ORAL | Status: DC
  Administered 2015-06-11 – 2015-06-13 (×5): 40 mg via ORAL
  Filled 2015-06-10 (×5): qty 1

## 2015-06-10 MED ORDER — FLUTICASONE PROPIONATE 50 MCG/ACT NA SUSP: 1.0000 | Freq: Every day | NASAL | Status: DC | PRN

## 2015-06-10 MED ORDER — LORATADINE 10 MG PO TABS: 10.0000 mg | ORAL_TABLET | Freq: Every day | ORAL | Status: DC | PRN

## 2015-06-10 MED ORDER — LOSARTAN POTASSIUM 50 MG PO TABS: 100.0000 mg | ORAL_TABLET | Freq: Every day | ORAL | Status: DC

## 2015-06-10 MED ORDER — SODIUM CHLORIDE 0.9 % IV SOLN: Freq: Once | INTRAVENOUS | Status: DC

## 2015-06-10 MED ORDER — ISOSORBIDE MONONITRATE ER 30 MG PO TB24: 30.0000 mg | ORAL_TABLET | Freq: Every day | ORAL | Status: DC

## 2015-06-10 MED ORDER — FERROUS SULFATE 325 (65 FE) MG PO TABS
325.0000 mg | ORAL_TABLET | Freq: Every day | ORAL | Status: DC
  Administered 2015-06-11 – 2015-06-13 (×3): 325 mg via ORAL
  Filled 2015-06-10 (×3): qty 1

## 2015-06-10 MED ORDER — DILTIAZEM HCL ER COATED BEADS 240 MG PO CP24: 240.0000 mg | ORAL_CAPSULE | Freq: Every day | ORAL | Status: DC | PRN

## 2015-06-10 NOTE — ED Notes (Addendum)
Pt states he had low hgb in December and was found to have a bleeding vein in his bowels. States he has been feeling weaker over the past few days and went to his cardiologist who stated that he has a hgb of 7.3 again and was instructed to come in. Pt is on Eliquis. Alert and oriented.

## 2015-06-10 NOTE — Patient Instructions (Addendum)
Medication Instructions:  Your physician has recommended you make the following change in your medication:   1-START Imdur 30 mg by mouth daily  Labwork: Your physician recommends that you have lab work today BMET and CBC  Testing/Procedures: NONE  Follow-Up: Your physician wants you to follow-up as already scheduled with Dr. Caryl Comes.   If you need a refill on your cardiac medications before your next appointment, please call your pharmacy.

## 2015-06-10 NOTE — ED Provider Notes (Addendum)
CSN: [id number]     Arrival date & time 06/10/15  1836 History   First MD Initiated Contact with Patient 06/10/15 1926     Chief Complaint  Patient presents with  . Abnormal Lab     (Consider location/radiation/quality/duration/timing/severity/associated sxs/prior Treatment) HPI.... Level 5 caveat for urgent need for intervention.  Chief complaint is weakness and low hemoglobin.  According to patient's history, he had a "bleeding vein in his stomach" on December 16 resulting in anemia and a blood transfusion. His hemoglobin had climbed to greater than 10 as a baseline. Today it was checked in the office and found to be low again in the 7 range. He feels weak and low energy. Eliquis was recently restarted. Patient reports black stool.  Past Medical History  Diagnosis Date  . Hypertension   . Sinus bradycardia   . ED (erectile dysfunction)   . Arthropathy, unspecified, site unspecified   . Prostatitis, unspecified   . Allergic rhinitis due to pollen   . Diverticulosis     severe in descending, sigmoid colon.   . Skin cancer     basal and squamous cell  . Abnormal CT scan, stomach 02/2014    Thickening of gastric fundus and cardia.  gastritis on EGD 03/2014  . Atherosclerosis     a. Noted by abdominal CT 02/2014 (h/o normal nuc 2011).  . Habitual alcohol use   . OSA on CPAP   . Hyperlipidemia   . Obesity   . Atrial flutter ([place]) 02/24/14    s/p ablation 11/15  . GERD (gastroesophageal reflux disease) 03/2014    small HH and gastritis on EGD  . PAF (paroxysmal atrial fibrillation) ([place])   . Cardiomyopathy ([place])     tachy mediated - a. TEE (10/15):  EF 30%;  b. Echo after NSR restored (10/15):  mild LVH, EF 55-60%, mild AS, mild AI, mild MR, mild to mod LAE, mild RAE  . Esophageal stricture 03/2014    traversable with endoscope. not dilated.   . Gastric AVM   . Esophageal stricture   . Hemorrhoids   . GI bleed    Past Surgical History  Procedure Laterality Date  . Inguinal  hernia repair      right  . Inguinal hernia repair      left  . Tonsillectomy    . Orif fracture of the elbow    . Squamous cell carcinoma excision    . Skin cancer excision  05/21/12    Squamous cell ca  . Hernia repair    . Tee without cardioversion N/A 02/24/2014    Procedure: TRANSESOPHAGEAL ECHOCARDIOGRAM (TEE);  Surgeon: Lelon Perla, MD;  Location: [facility] ENDOSCOPY;  Service: Cardiovascular;  Laterality: N/A;  . Cardioversion N/A 02/24/2014    Procedure: CARDIOVERSION;  Surgeon: Lelon Perla, MD;  Location: [facility] ENDOSCOPY;  Service: Cardiovascular;  Laterality: N/A;  . Ablation  03/19/2014    RFCA of atrial flutter by Dr Caryl Comes  . Esophagogastroduodenoscopy N/A 04/09/2014    Procedure: ESOPHAGOGASTRODUODENOSCOPY (EGD);  Surgeon: Inda Castle, MD;  Location: [place];  Service: Endoscopy;  Laterality: N/A;  . Atrial flutter ablation N/A 03/19/2014    Procedure: ATRIAL FLUTTER ABLATION;  Surgeon: Deboraha Sprang, MD;  Location: [facility] CATH LAB;  Service: Cardiovascular;  Laterality: N/A;  . Cardioversion Right 09/10/2014    Procedure: CARDIOVERSION;  Surgeon: Deboraha Sprang, MD;  Location: [facility] CATH LAB;  Service: Cardiovascular;  Laterality: Right;  . Colonoscopy N/A 04/23/2015  Procedure: COLONOSCOPY;  Surgeon: Ladene Artist, MD;  Location: [facility] ENDOSCOPY;  Service: Endoscopy;  Laterality: N/A;  . Esophagogastroduodenoscopy N/A 04/23/2015    Procedure: ESOPHAGOGASTRODUODENOSCOPY (EGD);  Surgeon: Ladene Artist, MD;  Location: [facility] ENDOSCOPY;  Service: Endoscopy;  Laterality: N/A;   Family History  Problem Relation Age of Onset  . Cancer Mother     ovarian  . Other Father     renal disease- grief over loss of spouse  . Cancer Brother     prostate- died of hematologic disorder 2nd to chemo  . Diabetes Neg Hx   . Coronary artery disease Neg Hx   . Colon cancer Neg Hx   . Stomach cancer Neg Hx   . Rectal cancer Neg Hx   . Pulmonary fibrosis Brother    Social History   Substance Use Topics  . Smoking status: Former Smoker -- 0.00 packs/day for 40 years    Types: Cigarettes, Cigars    Quit date: 09/18/2008  . Smokeless tobacco: Current User    Types: Chew     Comment: Has Occasional Cigar/ quit   . Alcohol Use: 12.6 oz/week    21 Glasses of wine per week    Review of Systems  Reason unable to perform ROS: Urgent need for intervention.      Allergies  Review of patient's allergies indicates no known allergies.  Home Medications   Prior to Admission medications   Medication Sig Start Date End Date Taking? Authorizing Provider  apixaban (ELIQUIS) 5 MG TABS tablet Take 1 tablet (5 mg total) by mouth 2 (two) times daily. 04/30/15  Yes Janith Lima, MD  atorvastatin (LIPITOR) 40 MG tablet TAKE 1 TABLET ONCE DAILY. Patient taking differently: Take 1 tablet by mouth every evening. 01/29/15  Yes Janith Lima, MD  diltiazem (DILACOR XR) 240 MG 24 hr capsule Take 240 mg by mouth daily as needed (increased heart rate).   Yes Historical Provider, MD  ferrous sulfate 325 (65 FE) MG tablet Take 1 tablet (325 mg total) by mouth daily with breakfast. 04/25/15  Yes Reyne Dumas, MD  fluticasone (FLONASE) 50 MCG/ACT nasal spray Place 1 spray into both nostrils daily as needed for allergies or rhinitis. **SHAKE GENTLY**   Yes Historical Provider, MD  furosemide (LASIX) 40 MG tablet Take 1 tablet (40 mg total) by mouth daily. 05/01/15  Yes Reyne Dumas, MD  isosorbide mononitrate (IMDUR) 30 MG 24 hr tablet Take 1 tablet (30 mg total) by mouth daily. 06/10/15  Yes Isaiah Serge, NP  loratadine (CLARITIN) 10 MG tablet Take 10 mg by mouth daily as needed for allergies.    Yes Historical Provider, MD  losartan (COZAAR) 100 MG tablet TAKE 1 TABLET DAILY. Patient taking differently: Take 1 tablet by mouth daily in the evening. 05/04/15  Yes Deboraha Sprang, MD  MAGNESIUM PO Take 2 tablets by mouth 2 (two) times daily.   Yes Historical Provider, MD  Multiple  Vitamins-Minerals (EYE VITAMINS PO) Take 1 tablet by mouth 2 (two) times daily. Macular Supplement   Yes Historical Provider, MD  nitroGLYCERIN (NITROSTAT) 0.4 MG SL tablet Place 1 tablet (0.4 mg total) under the tongue every 5 (five) minutes as needed for chest pain. 12/23/14  Yes Deboraha Sprang, MD  pantoprazole (PROTONIX) 40 MG tablet Take 1 tablet (40 mg total) by mouth 2 (two) times daily. 04/25/15  Yes Reyne Dumas, MD   BP 111/57 mmHg  Pulse 88  Temp(Src) 98 F (36.7 C) (Oral)  Resp 18  SpO2 96% Physical Exam  Constitutional: He is oriented to person, place, and time.  Pale, alert  HENT:  Head: Normocephalic and atraumatic.  Eyes: Conjunctivae and EOM are normal. Pupils are equal, round, and reactive to light.  Neck: Normal range of motion. Neck supple.  Cardiovascular: Normal rate and regular rhythm.   Pulmonary/Chest: Effort normal and breath sounds normal.  Abdominal: Soft. Bowel sounds are normal.  Genitourinary:  No masses, blackish stool, heme pos  Musculoskeletal: Normal range of motion.  Neurological: He is alert and oriented to person, place, and time.  Skin: Skin is warm and dry.  Psychiatric: He has a normal mood and affect. His behavior is normal.  Nursing note and vitals reviewed.   ED Course  Procedures (including critical care time) Labs Review Labs Reviewed  COMPREHENSIVE METABOLIC PANEL - Abnormal; Notable for the following:    Glucose, Bld 112 (*)    BUN 22 (*)    Creatinine, Ser 1.43 (*)    Total Protein 6.4 (*)    ALT 16 (*)    GFR calc non Af Amer 45 (*)    GFR calc Af Amer 52 (*)    All other components within normal limits  CBC - Abnormal; Notable for the following:    RBC 2.37 (*)    Hemoglobin 6.8 (*)    HCT 22.3 (*)    RDW 18.8 (*)    All other components within normal limits  POC OCCULT BLOOD, ED - Abnormal; Notable for the following:    Fecal Occult Bld POSITIVE (*)    All other components within normal limits  TYPE AND SCREEN   ABO/RH    Imaging Review No results found. I have personally reviewed and evaluated these images and lab results as part of my medical decision-making.   EKG Interpretation None     CRITICAL CARE Performed by: Nat Christen Total critical care time: 30 minutes Critical care time was exclusive of separately billable procedures and treating other patients. Critical care was necessary to treat or prevent imminent or life-threatening deterioration. Critical care was time spent personally by me on the following activities: development of treatment plan with patient and/or surrogate as well as nursing, discussions with consultants, evaluation of patient's response to treatment, examination of patient, obtaining history from patient or surrogate, ordering and performing treatments and interventions, ordering and review of laboratory studies, ordering and review of radiographic studies, pulse oximetry and re-evaluation of patient's condition. MDM   Final diagnoses:  UGI bleed  Anemia, unspecified anemia type    Patient's hemoglobin has dropped to 6.8     We'll discuss with gastroenterology. Admit to general medicine. IV Protonix ordered. Blood ordered.    Nat Christen, MD 06/10/15 [id number]  Nat Christen, MD 06/10/15 [id number]

## 2015-06-10 NOTE — H&P (Addendum)
[facility] History and Physical  [name] [id number] DOB: Nov 12, 1932 DOA: 06/10/2015  Referring physician: EDP PCP: [name], MD   Chief Complaint: Anemia   HPI: Donald [name] is a 82 y.o. male with h/o PAF, on eliquis and PRN cardizem.  Patient presented to his cardiologist today with worsening DOE that had been ongoing for the past week or so.  He notes his stool has been black in color.  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  AVM was cauterized under EGD by Dr. Fuller Plan.  Patient had been restarted on eliquis.  HGB checked at cardiology office today and was 6.8, down from 11 from where it had recovered to after the December GIB.  Review of Systems: Systems reviewed.  As above, otherwise negative  Past Medical History  Diagnosis Date  . Hypertension   . Sinus bradycardia   . ED (erectile dysfunction)   . Arthropathy, unspecified, site unspecified   . Prostatitis, unspecified   . Allergic rhinitis due to pollen   . Diverticulosis     severe in descending, sigmoid colon.   . Skin cancer     basal and squamous cell  . Abnormal CT scan, stomach 02/2014    Thickening of gastric fundus and cardia.  gastritis on EGD 03/2014  . Atherosclerosis     a. Noted by abdominal CT 02/2014 (h/o normal nuc 2011).  . Habitual alcohol use   . OSA on CPAP   . Hyperlipidemia   . Obesity   . Atrial flutter ([place]) 02/24/14    s/p ablation 11/15  . GERD (gastroesophageal reflux disease) 03/2014    small HH and gastritis on EGD  . PAF (paroxysmal atrial fibrillation) ([place])   . Cardiomyopathy ([place])     tachy mediated - a. TEE (10/15):  EF 30%;  b. Echo after NSR restored (10/15):  mild LVH, EF 55-60%, mild AS, mild AI, mild MR, mild to mod LAE, mild RAE  . Esophageal stricture 03/2014    traversable with endoscope. not dilated.   . Gastric AVM   .  Esophageal stricture   . Hemorrhoids   . GI bleed    Past Surgical History  Procedure Laterality Date  . Inguinal hernia repair      right  . Inguinal hernia repair      left  . Tonsillectomy    . Orif fracture of the elbow    . Squamous cell carcinoma excision    . Skin cancer excision  05/21/12    Squamous cell ca  . Hernia repair    . Tee without cardioversion N/A 02/24/2014    Procedure: TRANSESOPHAGEAL ECHOCARDIOGRAM (TEE);  Surgeon: Lelon Perla, MD;  Location: [facility] ENDOSCOPY;  Service: Cardiovascular;  Laterality: N/A;  . Cardioversion N/A 02/24/2014    Procedure: CARDIOVERSION;  Surgeon: Lelon Perla, MD;  Location: [facility] ENDOSCOPY;  Service: Cardiovascular;  Laterality: N/A;  . Ablation  03/19/2014    RFCA of atrial flutter by Dr Caryl Comes  . Esophagogastroduodenoscopy N/A 04/09/2014    Procedure: ESOPHAGOGASTRODUODENOSCOPY (EGD);  Surgeon: Inda Castle, MD;  Location: [place];  Service: Endoscopy;  Laterality: N/A;  . Atrial flutter ablation N/A 03/19/2014    Procedure: ATRIAL FLUTTER ABLATION;  Surgeon: Deboraha Sprang, MD;  Location: [facility] CATH LAB;  Service: Cardiovascular;  Laterality: N/A;  . Cardioversion Right 09/10/2014  Procedure: CARDIOVERSION;  Surgeon: Deboraha Sprang, MD;  Location: [facility] CATH LAB;  Service: Cardiovascular;  Laterality: Right;  . Colonoscopy N/A 04/23/2015    Procedure: COLONOSCOPY;  Surgeon: Ladene Artist, MD;  Location: [facility] ENDOSCOPY;  Service: Endoscopy;  Laterality: N/A;  . Esophagogastroduodenoscopy N/A 04/23/2015    Procedure: ESOPHAGOGASTRODUODENOSCOPY (EGD);  Surgeon: Ladene Artist, MD;  Location: [facility] ENDOSCOPY;  Service: Endoscopy;  Laterality: N/A;   Social History:  reports that he quit smoking about 6 years ago. His smoking use included Cigarettes and Cigars. He smoked 0.00 packs per day for 40 years. His smokeless tobacco use includes Chew. He reports that he drinks about 12.6 oz of alcohol per week. He reports that he does not use  illicit drugs.  No Known Allergies  Family History  Problem Relation Age of Onset  . Cancer Mother     ovarian  . Other Father     renal disease- grief over loss of spouse  . Cancer Brother     prostate- died of hematologic disorder 2nd to chemo  . Diabetes Neg Hx   . Coronary artery disease Neg Hx   . Colon cancer Neg Hx   . Stomach cancer Neg Hx   . Rectal cancer Neg Hx   . Pulmonary fibrosis Brother      Prior to Admission medications   Medication Sig Start Date End Date Taking? Authorizing Provider  apixaban (ELIQUIS) 5 MG TABS tablet Take 1 tablet (5 mg total) by mouth 2 (two) times daily. 04/30/15  Yes Janith Lima, MD  atorvastatin (LIPITOR) 40 MG tablet TAKE 1 TABLET ONCE DAILY. Patient taking differently: Take 1 tablet by mouth every evening. 01/29/15  Yes Janith Lima, MD  diltiazem (DILACOR XR) 240 MG 24 hr capsule Take 240 mg by mouth daily as needed (increased heart rate).   Yes Historical Provider, MD  ferrous sulfate 325 (65 FE) MG tablet Take 1 tablet (325 mg total) by mouth daily with breakfast. 04/25/15  Yes Reyne Dumas, MD  fluticasone (FLONASE) 50 MCG/ACT nasal spray Place 1 spray into both nostrils daily as needed for allergies or rhinitis. **SHAKE GENTLY**   Yes Historical Provider, MD  furosemide (LASIX) 40 MG tablet Take 1 tablet (40 mg total) by mouth daily. 05/01/15  Yes Reyne Dumas, MD  isosorbide mononitrate (IMDUR) 30 MG 24 hr tablet Take 1 tablet (30 mg total) by mouth daily. 06/10/15  Yes Isaiah Serge, NP  loratadine (CLARITIN) 10 MG tablet Take 10 mg by mouth daily as needed for allergies.    Yes Historical Provider, MD  losartan (COZAAR) 100 MG tablet TAKE 1 TABLET DAILY. Patient taking differently: Take 1 tablet by mouth daily in the evening. 05/04/15  Yes Deboraha Sprang, MD  MAGNESIUM PO Take 2 tablets by mouth 2 (two) times daily.   Yes Historical Provider, MD  Multiple Vitamins-Minerals (EYE VITAMINS PO) Take 1 tablet by mouth 2 (two) times  daily. Macular Supplement   Yes Historical Provider, MD  nitroGLYCERIN (NITROSTAT) 0.4 MG SL tablet Place 1 tablet (0.4 mg total) under the tongue every 5 (five) minutes as needed for chest pain. 12/23/14  Yes Deboraha Sprang, MD  pantoprazole (PROTONIX) 40 MG tablet Take 1 tablet (40 mg total) by mouth 2 (two) times daily. 04/25/15  Yes Reyne Dumas, MD   Physical Exam: Filed Vitals:   06/10/15 1850 06/10/15 2235  BP: 111/57 117/63  Pulse: 88 78  Temp: 98 F (36.7 C) 98.4 F (  36.9 C)  Resp: 18 18    BP 117/63 mmHg  Pulse 78  Temp(Src) 98.4 F (36.9 C) (Oral)  Resp 18  SpO2 99%  General Appearance:    Alert, oriented, no distress, appears stated age  Head:    Normocephalic, atraumatic  Eyes:    PERRL, EOMI, sclera non-icteric        Nose:   Nares without drainage or epistaxis. Mucosa, turbinates normal  Throat:   Moist mucous membranes. Oropharynx without erythema or exudate.  Neck:   Supple. No carotid bruits.  No thyromegaly.  No lymphadenopathy.   Back:     No CVA tenderness, no spinal tenderness  Lungs:     Clear to auscultation bilaterally, without wheezes, rhonchi or rales  Chest wall:    No tenderness to palpitation  Heart:    Regular rate and rhythm without murmurs, gallops, rubs  Abdomen:     Soft, non-tender, nondistended, normal bowel sounds, no organomegaly  Genitalia:    deferred  Rectal:    deferred  Extremities:   No clubbing, cyanosis or edema.  Pulses:   2+ and symmetric all extremities  Skin:   Skin color, texture, turgor normal, no rashes or lesions  Lymph nodes:   Cervical, supraclavicular, and axillary nodes normal  Neurologic:   CNII-XII intact. Normal strength, sensation and reflexes      throughout    Labs on Admission:  Basic Metabolic Panel:  Recent Labs Lab 06/10/15 1038 06/10/15 1945  NA 137 141  K 4.4 4.4  CL 106 107  CO2 23 24  GLUCOSE 107* 112*  BUN 21 22*  CREATININE 1.04 1.43*  CALCIUM 8.7 9.0   Liver Function Tests:  Recent  Labs Lab 06/10/15 1945  AST 23  ALT 16*  ALKPHOS 64  BILITOT 0.3  PROT 6.4*  ALBUMIN 3.7   No results for input(s): LIPASE, AMYLASE in the last 168 hours. No results for input(s): AMMONIA in the last 168 hours. CBC:  Recent Labs Lab 06/10/15 1038 06/10/15 1945  WBC 7.2 7.3  NEUTROABS 5.3  --   HGB 7.3* 6.8*  HCT 23.5* 22.3*  MCV 90.7 94.1  PLT 415* 376   Cardiac Enzymes: No results for input(s): CKTOTAL, CKMB, CKMBINDEX, TROPONINI in the last 168 hours.  BNP (last 3 results) No results for input(s): PROBNP in the last 8760 hours. CBG: No results for input(s): GLUCAP in the last 168 hours.  Radiological Exams on Admission: No results found.  EKG: Independently reviewed.  Assessment/Plan Principal Problem:   Anemia due to blood loss Active Problems:   Anticoagulant long-term use   Melena   1. Anemia due to blood loss - 1. 2 unit PRBC ordered for transfusion 2. Repeat CBC in AM 2. Melena - had gastric AVM that was bleeding in December, was cauterized at that time 1. Holding eliquis 2. Conveniently Dr. Hilarie Fredrickson (who actually was scheduled to see patient in Feb and knows patient) happens to be on call tonight for GI 3. Admitting 4. NPO after midnight 5. SCDs only for DVT ppx 6. Protonix IV 7. Patient likely getting upper EGD tomorrow 3. HTN -  1. Continue IMDUR 2. Holding losartan and lasix due to issue #1 and slight bump in creatinine from 1.0 to 1.4 today.    Code Status: Full  Family Communication: No family in room Disposition Plan: Admit to inpatient   Time spent: 68 min  Jerlyn Pain M. [facility] Pager [phone]  If 7AM-7PM, please contact  the day team taking care of the patient [email or web address] Password [facility] 06/10/2015, 10:59 PM

## 2015-06-10 NOTE — ED Notes (Addendum)
Pt being sent by [facility] d/t Hgb 7.3.  [facility] staff unaware of cause.  Denies current GI bleed.  Hx of GI bleed.

## 2015-06-10 NOTE — Progress Notes (Signed)
Cardiology Office Note   Date:  06/10/2015   ID:  Donald [name], DOB 02-Sep-1932, MRN [id number]  PCP:  [name], MD  Cardiologist:  Dr. Caryl Comes   Patient Care Team: Janith Lima, MD as PCP - General (Internal Medicine) Franchot Gallo, MD (Urology) [name] [name], MD (Dermatology) Monna Fam, MD as Consulting Physician (Ophthalmology) Deboraha Sprang, MD as Consulting Physician (Cardiology)  Chief Complaint  Patient presents with  . Shortness of Breath    pt c/o SOB and no energy  . Atrial Fibrillation      History of Present Illness: Donald [name] is a 82 y.o. male who presents for increasing DOE and fatigue much like when his Hgb was 6.  His eliquis was held and he had EGD and colonoscopy found to have AV malformation and polypectomy.  He is now back on Eliquis.   He has a h/o paroxsymal atrial flutter with associated tachycardia mediated cardiomyopathy. He has been on chronic anticoagulation with Eliquis. He underwent RFCA by Dr. Caryl Comes 03/2014 and his LV function improved once in normal sinus rthythm. His most recent 2D echo 02/2014 showed normal EF at 55-60%. Also with h/o functionally bicuspid aortic valve, diastolic HF, HTN, HLD, ETOH abuse and skin CA. In 03/2014, he also underwent an EGD to evaluate possible gastric neoplasm. Biopsy demonstrated chronic active gastritis only.In June 2016, the patient complained of chest pain and Dr. Caryl Comes ordered a myoview NST, which was intermediate risk with mild ischemia in the LCx/OM territory. It was decided to pursue medical therapy with PRN nitroglycerin prior to exercise and avoid invasive workup at that time.    Today pt is complaining of fatigue and DOE.  No chest pain.  And occ heart racing. -over last month he has taken an one NTG SL and one po prn dilt.  His stools ar dark because he is on Iron.  He is to see GI next week.     Past Medical History  Diagnosis Date  . Hypertension   . Sinus bradycardia   . ED (erectile  dysfunction)   . Arthropathy, unspecified, site unspecified   . Prostatitis, unspecified   . Allergic rhinitis due to pollen   . Diverticulosis     severe in descending, sigmoid colon.   . Skin cancer     basal and squamous cell  . Abnormal CT scan, stomach 02/2014    Thickening of gastric fundus and cardia.  gastritis on EGD 03/2014  . Atherosclerosis     a. Noted by abdominal CT 02/2014 (h/o normal nuc 2011).  . Habitual alcohol use   . OSA on CPAP   . Hyperlipidemia   . Obesity   . Atrial flutter ([place]) 02/24/14    s/p ablation 11/15  . GERD (gastroesophageal reflux disease) 03/2014    small HH and gastritis on EGD  . PAF (paroxysmal atrial fibrillation) ([place])   . Cardiomyopathy ([place])     tachy mediated - a. TEE (10/15):  EF 30%;  b. Echo after NSR restored (10/15):  mild LVH, EF 55-60%, mild AS, mild AI, mild MR, mild to mod LAE, mild RAE  . Esophageal stricture 03/2014    traversable with endoscope. not dilated.   . Gastric AVM   . Esophageal stricture   . Hemorrhoids   . GI bleed     Past Surgical History  Procedure Laterality Date  . Inguinal hernia repair      right  . Inguinal hernia repair  left  . Tonsillectomy    . Orif fracture of the elbow    . Squamous cell carcinoma excision    . Skin cancer excision  05/21/12    Squamous cell ca  . Hernia repair    . Tee without cardioversion N/A 02/24/2014    Procedure: TRANSESOPHAGEAL ECHOCARDIOGRAM (TEE);  Surgeon: Lelon Perla, MD;  Location: [facility] ENDOSCOPY;  Service: Cardiovascular;  Laterality: N/A;  . Cardioversion N/A 02/24/2014    Procedure: CARDIOVERSION;  Surgeon: Lelon Perla, MD;  Location: [facility] ENDOSCOPY;  Service: Cardiovascular;  Laterality: N/A;  . Ablation  03/19/2014    RFCA of atrial flutter by Dr Caryl Comes  . Esophagogastroduodenoscopy N/A 04/09/2014    Procedure: ESOPHAGOGASTRODUODENOSCOPY (EGD);  Surgeon: Inda Castle, MD;  Location: [place];  Service: Endoscopy;  Laterality: N/A;    . Atrial flutter ablation N/A 03/19/2014    Procedure: ATRIAL FLUTTER ABLATION;  Surgeon: Deboraha Sprang, MD;  Location: [facility] CATH LAB;  Service: Cardiovascular;  Laterality: N/A;  . Cardioversion Right 09/10/2014    Procedure: CARDIOVERSION;  Surgeon: Deboraha Sprang, MD;  Location: [facility] CATH LAB;  Service: Cardiovascular;  Laterality: Right;  . Colonoscopy N/A 04/23/2015    Procedure: COLONOSCOPY;  Surgeon: Ladene Artist, MD;  Location: [facility] ENDOSCOPY;  Service: Endoscopy;  Laterality: N/A;  . Esophagogastroduodenoscopy N/A 04/23/2015    Procedure: ESOPHAGOGASTRODUODENOSCOPY (EGD);  Surgeon: Ladene Artist, MD;  Location: [facility] ENDOSCOPY;  Service: Endoscopy;  Laterality: N/A;     Current Outpatient Prescriptions  Medication Sig Dispense Refill  . apixaban (ELIQUIS) 5 MG TABS tablet Take 1 tablet (5 mg total) by mouth 2 (two) times daily. 60 tablet 3  . atorvastatin (LIPITOR) 40 MG tablet TAKE 1 TABLET ONCE DAILY. 90 tablet 3  . diltiazem (DILACOR XR) 240 MG 24 hr capsule Take 240 mg by mouth daily as needed (increased heart rate).    . ferrous sulfate 325 (65 FE) MG tablet Take 1 tablet (325 mg total) by mouth daily with breakfast. 60 tablet 3  . fluticasone (FLONASE) 50 MCG/ACT nasal spray Place 1 spray into both nostrils daily as needed for allergies or rhinitis. **SHAKE GENTLY**    . furosemide (LASIX) 40 MG tablet Take 1 tablet (40 mg total) by mouth daily. 30 tablet 5  . isosorbide mononitrate (IMDUR) 30 MG 24 hr tablet Take 1 tablet (30 mg total) by mouth daily. 90 tablet 3  . loratadine (CLARITIN) 10 MG tablet Take 10 mg by mouth daily as needed for allergies.     Marland Kitchen losartan (COZAAR) 100 MG tablet TAKE 1 TABLET DAILY. 90 tablet 3  . Multiple Vitamins-Minerals (EYE VITAMINS PO) Take 1 tablet by mouth 2 (two) times daily. Macular Supplement    . nitroGLYCERIN (NITROSTAT) 0.4 MG SL tablet Place 1 tablet (0.4 mg total) under the tongue every 5 (five) minutes as needed for chest pain. 25 tablet 1   . pantoprazole (PROTONIX) 40 MG tablet Take 1 tablet (40 mg total) by mouth 2 (two) times daily. 60 tablet 1   No current facility-administered medications for this visit.    Allergies:   Review of patient's allergies indicates no known allergies.    Social History:  The patient  reports that he quit smoking about 6 years ago. His smoking use included Cigarettes and Cigars. He smoked 0.00 packs per day for 40 years. His smokeless tobacco use includes Chew. He reports that he drinks about 12.6 oz of alcohol per week. He reports that he  does not use illicit drugs.   Family History:  The patient's family history includes Cancer in his brother and mother; Other in his father; Pulmonary fibrosis in his brother. There is no history of Diabetes, Coronary artery disease, Colon cancer, Stomach cancer, or Rectal cancer.    ROS:  General:no colds or fevers, no weight changes Skin:no rashes or ulcers HEENT:no blurred vision, no congestion CV:see HPI PUL:see HPI GI:no diarrhea constipation or melena, no indigestion GU:no hematuria, no dysuria MS:no joint pain, no claudication Neuro:no syncope, no lightheadedness Endo:no diabetes, no thyroid disease Wt Readings from Last 3 Encounters:  06/10/15 218 lb 1.9 oz (98.939 kg)  05/28/15 219 lb (99.338 kg)  05/04/15 219 lb (99.338 kg)     PHYSICAL EXAM: VS:  BP 110/62 mmHg  Pulse 74  Ht 5\' 9"  (1.753 m)  Wt 218 lb 1.9 oz (98.939 kg)  BMI 32.20 kg/m2  SpO2 99% , BMI Body mass index is 32.2 kg/(m^2). General:Pleasant affect, NAD Skin:Warm and dry, brisk capillary refill HEENT:normocephalic, sclera clear, mucus membranes moist Neck:supple, no JVD, no bruits  Heart:S1S2 RRR without murmur, gallup, rub or click Lungs:clear without rales, rhonchi, or wheezes [id number], non tender, + BS, do not palpate liver spleen or masses Ext:no lower ext edema, 2+ pedal pulses, 2+ radial pulses Neuro:alert and oriented, MAE, follows commands, + facial  symmetry    EKG:  EKG is ordered today. The ekg ordered today demonstrates SR no acute changes from 04/21/15.Marland Kitchen     Recent Labs: 01/20/2015: TSH 0.57 04/22/2015: Magnesium 2.0 04/25/2015: ALT 15* 06/10/2015: BUN 21; Creat 1.04; Hemoglobin 7.3*; Platelets 415*; Potassium 4.4; Sodium 137    Lipid Panel    Component Value Date/Time   CHOL 121 01/20/2015 1621   TRIG 84.0 01/20/2015 1621   HDL 51.10 01/20/2015 1621   CHOLHDL 2 01/20/2015 1621   VLDL 16.8 01/20/2015 1621   LDLCALC 53 01/20/2015 1621   LDLDIRECT 138.8 04/18/2011 1020       Other studies Reviewed: Additional studies/ records that were reviewed today include: reviewed EKGs, endo, colonoscopy, previous nuc.   ASSESSMENT AND PLAN:  1. Exertional angina previously in the setting of severe anemia.  ECG without acute ST segment changes. His symptoms are now returning.  Last HGB was 11.  Will add po imdur to prevent anginal symptoms but will check CBC concern for drop in Hgb.   2. PAF/flutter, maintaining sinus rhythm. Prior ablation. History of tachycardia-mediated cardiomyopathy with subsequent normalization of LVEFCHADSVASC score is 4. Eliquis has been restarted. Again check H/H.    3. Iron deficiency anemia. EGD on 12/8 showed oozing gastric body AVM which was treated with ablation. Also had descending colonic polyp removed by colonoscopy. To consider repeat colonoscopy after healing - defer to GI.   4. OSA on CPAP.  5. CKD, stage 3. Creatinine currently normal.  6. Essential hypertension, on Cozaar.  7. Abnormal Myoview - would favor medical management for now - keep close watch on his blood count. today if H/H is stable will discuss with MD to consider cath.  If due to dropping Hgb will have treated by GI depending on H/H  8. Aortic Stenosis: Functionally bicuspid AV - recent echo noted - no cardinal symptoms noted.             Current medicines are reviewed with the patient today.  The patient Has no  concerns regarding medicines.  The following changes have been made:  See above Labs/ tests ordered today include:see above  Disposition:   FU:  see above  Signed, Isaiah Serge, NP  06/10/2015 5:58 PM    [facility] [address] [place], [place], [place] Phone: [phone]; Fax: [phone]  Addendum:  Pt's hgb now 7.3 a drop from 11.3- have asked pt to go to ER for further eval as he is symptomatic.  Would hold eliquis.

## 2015-06-11 ENCOUNTER — Encounter (HOSPITAL_COMMUNITY): Payer: Self-pay | Admitting: *Deleted

## 2015-06-11 DIAGNOSIS — K921 Melena: Secondary | ICD-10-CM | POA: Diagnosis not present

## 2015-06-11 DIAGNOSIS — R195 Other fecal abnormalities: Secondary | ICD-10-CM | POA: Diagnosis not present

## 2015-06-11 DIAGNOSIS — I10 Essential (primary) hypertension: Secondary | ICD-10-CM | POA: Diagnosis not present

## 2015-06-11 DIAGNOSIS — I471 Supraventricular tachycardia: Secondary | ICD-10-CM | POA: Diagnosis not present

## 2015-06-11 DIAGNOSIS — D500 Iron deficiency anemia secondary to blood loss (chronic): Secondary | ICD-10-CM | POA: Diagnosis not present

## 2015-06-11 DIAGNOSIS — Z7901 Long term (current) use of anticoagulants: Secondary | ICD-10-CM | POA: Diagnosis not present

## 2015-06-11 LAB — CBC
HEMATOCRIT: 26.4 % — AB (ref 39.0–52.0)
Hemoglobin: 8.4 g/dL — ABNORMAL LOW (ref 13.0–17.0)
MCH: 29.3 pg (ref 26.0–34.0)
MCHC: 31.8 g/dL (ref 30.0–36.0)
MCV: 92 fL (ref 78.0–100.0)
Platelets: 302 10*3/uL (ref 150–400)
RBC: 2.87 MIL/uL — ABNORMAL LOW (ref 4.22–5.81)
RDW: 18.1 % — AB (ref 11.5–15.5)
WBC: 6.7 10*3/uL (ref 4.0–10.5)

## 2015-06-11 LAB — BASIC METABOLIC PANEL
ANION GAP: 9 (ref 5–15)
BUN: 20 mg/dL (ref 6–20)
CALCIUM: 8.6 mg/dL — AB (ref 8.9–10.3)
CO2: 23 mmol/L (ref 22–32)
Chloride: 111 mmol/L (ref 101–111)
Creatinine, Ser: 1.12 mg/dL (ref 0.61–1.24)
GFR calc Af Amer: >60 mL/min (ref >60)
GFR calc non Af Amer: >60 mL/min (ref >60)
GLUCOSE: 95 mg/dL (ref 65–99)
Potassium: 4.5 mmol/L (ref 3.5–5.1)
Sodium: 143 mmol/L (ref 135–145)

## 2015-06-11 NOTE — Progress Notes (Signed)
Utilization Review Complete  

## 2015-06-11 NOTE — Progress Notes (Addendum)
   PROGRESS NOTE  Donald [name] [id number] DOB: 03-07-1933 DOA: 06/10/2015 PCP: [name], MD  HPI/Recap of past 24 hours: Denies pain,   Assessment/Plan: Principal Problem:   Anemia due to blood loss Active Problems:   Anticoagulant long-term use   Melena  Melena, anemia due to blood loss, s/p prbc transfusion, GI consulted scope tomorrow, hold eliquis, on ppi  HTN/HLD: continue imdur and lipitor, hold lasix/cozaar  PAF: on prn cardizem at home, eliquis held  Code Status: full  Family Communication: patient   Disposition Plan: not medically ready   Consultants:  GI  Procedures:  egd 1/27  Antibiotics:  none   Objective: BP 135/65 mmHg  Pulse 65  Temp(Src) 98.4 F (36.9 C) (Oral)  Resp 20  Ht 5\' 9"  (1.753 m)  Wt 98.4 kg (216 lb 14.9 oz)  BMI 32.02 kg/m2  SpO2 99%  Intake/Output Summary (Last 24 hours) at 06/11/15 1823 Last data filed at 06/11/15 1330  Gross per 24 hour  Intake   1400 ml  Output   1300 ml  Net    100 ml   Filed Weights   06/11/15 0012  Weight: 98.4 kg (216 lb 14.9 oz)    Exam:   General:  NAD  Cardiovascular: RRR  Respiratory: CTABL  Abdomen: Soft/ND/NT, positive BS  Musculoskeletal: No Edema  Neuro: aaox3  Data Reviewed: Basic Metabolic Panel:  Recent Labs Lab 06/10/15 1038 06/10/15 1945 06/11/15 0635  NA 137 141 143  K 4.4 4.4 4.5  CL 106 107 111  CO2 23 24 23   GLUCOSE 107* 112* 95  BUN 21 22* 20  CREATININE 1.04 1.43* 1.12  CALCIUM 8.7 9.0 8.6*   Liver Function Tests:  Recent Labs Lab 06/10/15 1945  AST 23  ALT 16*  ALKPHOS 64  BILITOT 0.3  PROT 6.4*  ALBUMIN 3.7   No results for input(s): LIPASE, AMYLASE in the last 168 hours. No results for input(s): AMMONIA in the last 168 hours. CBC:  Recent Labs Lab 06/10/15 1038 06/10/15 1945 06/11/15 0635  WBC 7.2 7.3 6.7  NEUTROABS 5.3  --   --   HGB 7.3* 6.8* 8.4*  HCT 23.5* 22.3* 26.4*  MCV 90.7 94.1 92.0  PLT 415* 376 302    Cardiac Enzymes:   No results for input(s): CKTOTAL, CKMB, CKMBINDEX, TROPONINI in the last 168 hours. BNP (last 3 results) No results for input(s): BNP in the last 8760 hours.  ProBNP (last 3 results) No results for input(s): PROBNP in the last 8760 hours.  CBG: No results for input(s): GLUCAP in the last 168 hours.  No results found for this or any previous visit (from the past 240 hour(s)).   Studies: No results found.  Scheduled Meds: . sodium chloride   Intravenous Once  . atorvastatin  40 mg Oral QPM  . ferrous sulfate  325 mg Oral Q breakfast  . isosorbide mononitrate  30 mg Oral Daily  . pantoprazole  40 mg Oral BID    Continuous Infusions:    Time spent: *mins  Graylee Arutyunyan MD, PhD  [facility] Pager [phone]. If 7PM-7AM, please contact night-coverage at [email or web address], password [facility] 06/11/2015, 6:23 PM  LOS: 1 day

## 2015-06-11 NOTE — Consult Note (Signed)
Referring Provider: No ref. provider found Primary Care Physician:  [name], MD Primary Gastroenterologist:  Dr. Hilarie Fredrickson  Reason for Consultation:  Anemia and heme positive stool  HPI: Donald [name] is a 82 y.o. male with h/o PAF, on eliquis (last does 1/25 AM). Patient presented to his cardiologist today with worsening DOE that had been ongoing for the past week or so. He notes his stool has been black in color, but he says that he is on iron as well. 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 AVM was cauterized under EGD by Dr. Fuller Plan. Patient had been restarted on eliquis.  HGB checked at cardiology office today and was 7.3 grams (repeat later was 6.8 grams), down from 11 grams just 3 weeks ago.  Has received 2 units PRBC's with Hgb increase to 8.4 grams.  Colonoscopy 12/8: Incomplete to hepatic flexure due to tortuous colon and inadequate sedation/pt discomfort. Descending polyp removed (sessile serrated polyp), sigmoid tics, grade 1 int/ext hemorrhoids.  EGD 12/8: non-obstructing GEjx stricture not dilated, oozing gastric body AVM with hemostasis post APC ablation.    Past Medical History  Diagnosis Date  . Hypertension   . Sinus bradycardia   . ED (erectile dysfunction)   . Arthropathy, unspecified, site unspecified   . Prostatitis, unspecified   . Allergic rhinitis due to pollen   . Diverticulosis     severe in descending, sigmoid colon.   . Skin cancer     basal and squamous cell  . Abnormal CT scan, stomach 02/2014    Thickening of gastric fundus and cardia.  gastritis on EGD 03/2014  . Atherosclerosis     a. Noted by abdominal CT 02/2014 (h/o normal nuc 2011).  . Habitual alcohol use   . OSA on CPAP   . Hyperlipidemia   . Obesity   . Atrial flutter ([place]) 02/24/14    s/p ablation 11/15  . GERD (gastroesophageal reflux disease) 03/2014      small HH and gastritis on EGD  . PAF (paroxysmal atrial fibrillation) ([place])   . Cardiomyopathy ([place])     tachy mediated - a. TEE (10/15):  EF 30%;  b. Echo after NSR restored (10/15):  mild LVH, EF 55-60%, mild AS, mild AI, mild MR, mild to mod LAE, mild RAE  . Esophageal stricture 03/2014    traversable with endoscope. not dilated.   . Gastric AVM   . Esophageal stricture   . Hemorrhoids   . GI bleed     Past Surgical History  Procedure Laterality Date  . Inguinal hernia repair      right  . Inguinal hernia repair      left  . Tonsillectomy    . Orif fracture of the elbow    . Squamous cell carcinoma excision    . Skin cancer excision  05/21/12    Squamous cell ca  . Hernia repair    . Tee without cardioversion N/A 02/24/2014    Procedure: TRANSESOPHAGEAL ECHOCARDIOGRAM (TEE);  Surgeon: Lelon Perla, MD;  Location: [facility] ENDOSCOPY;  Service: Cardiovascular;  Laterality: N/A;  . Cardioversion N/A 02/24/2014    Procedure: CARDIOVERSION;  Surgeon: Lelon Perla, MD;  Location: [facility] ENDOSCOPY;  Service: Cardiovascular;  Laterality: N/A;  . Ablation  03/19/2014    RFCA of atrial flutter by Dr Caryl Comes  . Esophagogastroduodenoscopy N/A 04/09/2014  Procedure: ESOPHAGOGASTRODUODENOSCOPY (EGD);  Surgeon: Inda Castle, MD;  Location: [place];  Service: Endoscopy;  Laterality: N/A;  . Atrial flutter ablation N/A 03/19/2014    Procedure: ATRIAL FLUTTER ABLATION;  Surgeon: Deboraha Sprang, MD;  Location: [facility] CATH LAB;  Service: Cardiovascular;  Laterality: N/A;  . Cardioversion Right 09/10/2014    Procedure: CARDIOVERSION;  Surgeon: Deboraha Sprang, MD;  Location: [facility] CATH LAB;  Service: Cardiovascular;  Laterality: Right;  . Colonoscopy N/A 04/23/2015    Procedure: COLONOSCOPY;  Surgeon: Ladene Artist, MD;  Location: [facility] ENDOSCOPY;  Service: Endoscopy;  Laterality: N/A;  . Esophagogastroduodenoscopy N/A 04/23/2015    Procedure: ESOPHAGOGASTRODUODENOSCOPY (EGD);  Surgeon: Ladene Artist, MD;  Location: [facility] ENDOSCOPY;  Service: Endoscopy;  Laterality: N/A;    Prior to Admission medications   Medication Sig Start Date End Date Taking? Authorizing Provider  apixaban (ELIQUIS) 5 MG TABS tablet Take 1 tablet (5 mg total) by mouth 2 (two) times daily. 04/30/15  Yes Janith Lima, MD  atorvastatin (LIPITOR) 40 MG tablet TAKE 1 TABLET ONCE DAILY. Patient taking differently: Take 1 tablet by mouth every evening. 01/29/15  Yes Janith Lima, MD  diltiazem (DILACOR XR) 240 MG 24 hr capsule Take 240 mg by mouth daily as needed (increased heart rate).   Yes Historical Provider, MD  ferrous sulfate 325 (65 FE) MG tablet Take 1 tablet (325 mg total) by mouth daily with breakfast. 04/25/15  Yes Reyne Dumas, MD  fluticasone (FLONASE) 50 MCG/ACT nasal spray Place 1 spray into both nostrils daily as needed for allergies or rhinitis. **SHAKE GENTLY**   Yes Historical Provider, MD  furosemide (LASIX) 40 MG tablet Take 1 tablet (40 mg total) by mouth daily. 05/01/15  Yes Reyne Dumas, MD  isosorbide mononitrate (IMDUR) 30 MG 24 hr tablet Take 1 tablet (30 mg total) by mouth daily. 06/10/15  Yes Isaiah Serge, NP  loratadine (CLARITIN) 10 MG tablet Take 10 mg by mouth daily as needed for allergies.    Yes Historical Provider, MD  losartan (COZAAR) 100 MG tablet TAKE 1 TABLET DAILY. Patient taking differently: Take 1 tablet by mouth daily in the evening. 05/04/15  Yes Deboraha Sprang, MD  MAGNESIUM PO Take 2 tablets by mouth 2 (two) times daily.   Yes Historical Provider, MD  Multiple Vitamins-Minerals (EYE VITAMINS PO) Take 1 tablet by mouth 2 (two) times daily. Macular Supplement   Yes Historical Provider, MD  nitroGLYCERIN (NITROSTAT) 0.4 MG SL tablet Place 1 tablet (0.4 mg total) under the tongue every 5 (five) minutes as needed for chest pain. 12/23/14  Yes Deboraha Sprang, MD  pantoprazole (PROTONIX) 40 MG tablet Take 1 tablet (40 mg total) by mouth 2 (two) times daily. 04/25/15  Yes Reyne Dumas, MD    Current Facility-Administered Medications  Medication Dose Route Frequency Provider Last Rate Last Dose  . 0.9 %  sodium chloride infusion   Intravenous Once Nat Christen, MD   Stopped at 06/10/15 2308  . atorvastatin (LIPITOR) tablet 40 mg  40 mg Oral QPM Etta Quill, DO   40 mg at 06/11/15 0043  . diltiazem (CARDIZEM CD) 24 hr capsule 240 mg  240 mg Oral Daily PRN Etta Quill, DO      . ferrous sulfate tablet 325 mg  325 mg Oral Q breakfast Jared M Gardner, DO      . fluticasone (FLONASE) 50 MCG/ACT nasal spray 1 spray  1 spray Each Nare Daily PRN  Etta Quill, DO      . isosorbide mononitrate (IMDUR) 24 hr tablet 30 mg  30 mg Oral Daily Etta Quill, DO      . loratadine (CLARITIN) tablet 10 mg  10 mg Oral Daily PRN Etta Quill, DO      . pantoprazole (PROTONIX) EC tablet 40 mg  40 mg Oral BID Etta Quill, DO   0 mg at 06/11/15 0044    Allergies as of 06/10/2015  . (No Known Allergies)    Family History  Problem Relation Age of Onset  . Cancer Mother     ovarian  . Other Father     renal disease- grief over loss of spouse  . Cancer Brother     prostate- died of hematologic disorder 2nd to chemo  . Diabetes Neg Hx   . Coronary artery disease Neg Hx   . Colon cancer Neg Hx   . Stomach cancer Neg Hx   . Rectal cancer Neg Hx   . Pulmonary fibrosis Brother     Social History   Social History  . Marital Status: Married    Spouse Name: N/A  . Number of Children: 3  . Years of Education: 16   Occupational History  . Land    Social History Main Topics  . Smoking status: Former Smoker -- 0.00 packs/day for 40 years    Types: Cigarettes, Cigars    Quit date: 09/18/2008  . Smokeless tobacco: Current User    Types: Chew     Comment: Has Occasional Cigar/ quit   . Alcohol Use: 12.6 oz/week    21 Glasses of wine per week  . Drug Use: No  . Sexual Activity: Not on file   Other Topics Concern  . 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 married 1961. 2 sons- '64, '63 , I daughter- '66, 4 grandchildren. work: Museum/gallery curator, retired but still consults. 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      No new stressors/     Review of Systems: Ten point ROS is O/W negative except as mentioned in HPI.  Physical Exam: Vital signs in last 24 hours: Temp:  [97.6 F (36.4 C)-98.4 F (36.9 C)] 97.8 F (36.6 C) (01/26 0429) Pulse Rate:  [66-88] 66 (01/26 0429) Resp:  [13-21] 20 (01/26 0429) BP: (99-122)/(57-80) 117/57 mmHg (01/26 0429) SpO2:  [95 %-100 %] 98 % (01/26 0429) Weight:  [216 lb 14.9 oz (98.4 kg)] 216 lb 14.9 oz (98.4 kg) (01/26 0012) Last BM Date: 06/10/15 (black stools, hemocult positive) General:  Alert, Well-developed, well-nourished, pleasant and cooperative in NAD Head:  Normocephalic and atraumatic. Eyes:  Sclera clear, no icterus.  Conjunctiva pink. Ears:  Normal auditory acuity. Mouth:  No deformity or lesions.   Lungs:  Clear throughout to auscultation.  No wheezes, crackles, or rhonchi.  Heart:  Regular rate and rhythm; no murmurs, clicks, rubs, or gallops. Abdomen:  Soft, non-distended.  BS present.  Non-tender. Rectal:  Deferred  Msk:  Symmetrical without gross deformities. Pulses:  Normal pulses noted. Extremities:  Without clubbing or edema. Neurologic:  Alert and  oriented x4;  grossly normal neurologically. Skin:  Intact without significant lesions or rashes. Psych:  Alert and cooperative. Normal mood and affect.  Intake/Output from previous day: 01/25 0701 - 01/26 0700 In: 920 [I.V.:250; Blood:670] Out: 275 [Urine:275]  Intake/Output this shift: Total I/O In: -  Out: 500 [Urine:500]  Lab Results:  Recent Labs  06/10/15 1038 06/10/15 1945 06/11/15 0635  WBC 7.2 7.3 6.7  HGB 7.3* 6.8* 8.4*  HCT 23.5* 22.3* 26.4*  PLT 415* 376 302    BMET  Recent Labs  06/10/15 1038 06/10/15 1945 06/11/15 0635  NA 137 141 143  K 4.4 4.4 4.5  CL 106 107 111  CO2 23 24 23   GLUCOSE 107* 112* 95  BUN 21 22* 20  CREATININE 1.04 1.43* 1.12  CALCIUM 8.7 9.0 8.6*   LFT  Recent Labs  06/10/15 1945  PROT 6.4*  ALBUMIN 3.7  AST 23  ALT 16*  ALKPHOS 64  BILITOT 0.3   IMPRESSION:  * Recurrent anemia in pt on Eliquis. FOBT + but no overt bleeding.  Had oozing gastric AVM on EGD in 04/2015.  Hgb found to be 7.3 grams on outpatient labs.  S/p 2 units PRBC's with increase in hgb to 8.4 grams.   * OSA on CPAP   * Chronic Eliquis for hx a fib, s/p ablation and DCCV. Currently in NSR.  Has not taken Eliquis since 1/25 AM.  PLAN: -Will plan for repeat EGD/enteroscopy 1/27 to allow for 2 days of Eliquis wash-out.  Ok for clear liquids today then NPO after midnight. -Monitor Hgb and transfuse prn to keep Hgb > 8  ZEHR, JESSICA D.  06/11/2015, 10:05 AM  Pager number [phone]      Attending physician's note   I have taken an interval history, reviewed the chart and examined the patient. I agree with the Advanced Practitioner's note, impression and recommendations. Recurrent heme + stool and progressively worsening anemia indicating a slow GI bleed. Suspect he has other AVMs. EGD/enteroscopy tomorrow after 2 days Eliquis wash out. Pending findings may need further evaluation with capsule endoscopy and colonoscopy with MAC-these could be done as outpatient if he remains stable.   Lucio Edward, MD [facility] [phone] Mon-Fri 8a-5p [phone] after 5p, weekends, holidays

## 2015-06-12 ENCOUNTER — Encounter (HOSPITAL_COMMUNITY)
Admission: EM | Disposition: A | Discharge status: Home / Self Care | Payer: Self-pay | Source: Home / Self Care | Attending: Internal Medicine

## 2015-06-12 ENCOUNTER — Encounter (HOSPITAL_COMMUNITY): Payer: Self-pay

## 2015-06-12 DIAGNOSIS — D500 Iron deficiency anemia secondary to blood loss (chronic): Secondary | ICD-10-CM | POA: Diagnosis not present

## 2015-06-12 DIAGNOSIS — R195 Other fecal abnormalities: Secondary | ICD-10-CM | POA: Diagnosis not present

## 2015-06-12 DIAGNOSIS — I10 Essential (primary) hypertension: Secondary | ICD-10-CM | POA: Diagnosis not present

## 2015-06-12 DIAGNOSIS — I471 Supraventricular tachycardia: Secondary | ICD-10-CM | POA: Diagnosis not present

## 2015-06-12 DIAGNOSIS — K921 Melena: Secondary | ICD-10-CM | POA: Diagnosis not present

## 2015-06-12 DIAGNOSIS — Z7901 Long term (current) use of anticoagulants: Secondary | ICD-10-CM | POA: Diagnosis not present

## 2015-06-12 HISTORY — PX: ENTEROSCOPY: SHX5533

## 2015-06-12 LAB — MAGNESIUM: MAGNESIUM: 2 mg/dL (ref 1.7–2.4)

## 2015-06-12 LAB — TYPE AND SCREEN
ABO/RH(D): O NEG
ANTIBODY SCREEN: NEGATIVE
UNIT DIVISION: 0
Unit division: 0

## 2015-06-12 LAB — BASIC METABOLIC PANEL
ANION GAP: 7 (ref 5–15)
BUN: 14 mg/dL (ref 6–20)
CALCIUM: 8.9 mg/dL (ref 8.9–10.3)
CHLORIDE: 110 mmol/L (ref 101–111)
CO2: 24 mmol/L (ref 22–32)
Creatinine, Ser: 0.94 mg/dL (ref 0.61–1.24)
GFR calc non Af Amer: >60 mL/min (ref >60)
Glucose, Bld: 92 mg/dL (ref 65–99)
Potassium: 4.3 mmol/L (ref 3.5–5.1)
SODIUM: 141 mmol/L (ref 135–145)

## 2015-06-12 LAB — CBC
HEMATOCRIT: 27.8 % — AB (ref 39.0–52.0)
HEMOGLOBIN: 8.7 g/dL — AB (ref 13.0–17.0)
MCH: 29 pg (ref 26.0–34.0)
MCHC: 31.3 g/dL (ref 30.0–36.0)
MCV: 92.7 fL (ref 78.0–100.0)
Platelets: 297 10*3/uL (ref 150–400)
RBC: 3 MIL/uL — ABNORMAL LOW (ref 4.22–5.81)
RDW: 17.9 % — AB (ref 11.5–15.5)
WBC: 6.3 10*3/uL (ref 4.0–10.5)

## 2015-06-12 SURGERY — ENTEROSCOPY
Anesthesia: Moderate Sedation

## 2015-06-12 MED ORDER — DIPHENHYDRAMINE HCL 50 MG/ML IJ SOLN
INTRAMUSCULAR | Status: DC | PRN
  Administered 2015-06-12: 50 mg via INTRAVENOUS

## 2015-06-12 MED ORDER — FENTANYL CITRATE (PF) 100 MCG/2ML IJ SOLN
INTRAMUSCULAR | Status: AC
  Filled 2015-06-12: qty 2

## 2015-06-12 MED ORDER — MIDAZOLAM HCL 5 MG/ML IJ SOLN
INTRAMUSCULAR | Status: AC
  Filled 2015-06-12: qty 2

## 2015-06-12 MED ORDER — DIPHENHYDRAMINE HCL 50 MG/ML IJ SOLN
INTRAMUSCULAR | Status: AC
  Filled 2015-06-12: qty 1

## 2015-06-12 MED ORDER — FENTANYL CITRATE (PF) 100 MCG/2ML IJ SOLN
INTRAMUSCULAR | Status: DC | PRN
  Administered 2015-06-12 (×2): 25 ug via INTRAVENOUS

## 2015-06-12 MED ORDER — MIDAZOLAM HCL 10 MG/2ML IJ SOLN
INTRAMUSCULAR | Status: DC | PRN
  Administered 2015-06-12 (×2): 2 mg via INTRAVENOUS

## 2015-06-12 NOTE — Clinical Documentation Improvement (Signed)
Internal Medicine  Can the diagnosis of anemia due to blood loss be further specified in progress notes and discharge summary?   Acute blood loss anemia  Acute on Chronic blood loss anemia  Chronic blood loss anemia  Other  Clinically Undetermined  Document any associated diagnoses/conditions.   Supporting Information:  1/25 H&P HGB checked at cardiology office today and was 6.8, down from 11 from where it had recovered to after the December GIB. Assessment/Plan Principal Problem:  Anemia due to blood loss Active Problems:  Anticoagulant long-term use  Melena Anemia due to blood loss - 1. 2 unit PRBC ordered for transfusion 2. Repeat CBC in AM  1/26 Progress note: Assessment/Plan: Principal Problem:  Anemia due to blood loss Active Problems:  Anticoagulant long-term use  Melena Melena, anemia due to blood loss, s/p prbc transfusion, GI consulted scope tomorrow, hold eliquis, on ppi  Component     Latest Ref Rng 06/10/2015 06/10/2015 06/11/2015 06/12/2015        10:38 AM  7:45 PM    Hemoglobin     13.0 - 17.0 g/dL 7.3 (L) 6.8 (LL) 8.4 (L) 8.7 (L)  HCT     39.0 - 52.0 % 23.5 (L) 22.3 (L) 26.4 (L) 27.8 (L)   Component     Latest Ref Rng 06/10/2015  Fecal Occult Blood, POC     NEGATIVE POSITIVE (A)     Please exercise your independent, professional judgment when responding. A specific answer is not anticipated or expected.   Thank You,  [place] [phone]

## 2015-06-12 NOTE — Progress Notes (Signed)
PROGRESS NOTE  Donald [name] [id number] DOB: 1932-12-03 DOA: 06/10/2015 PCP: [name], MD  HPI/Recap of past 24 hours: Returned from EGD, denies pain, no n/v  Assessment/Plan: Principal Problem:   Anemia due to blood loss Active Problems:   Anticoagulant long-term use   Melena  Melena, anemia due to acute on chronic blood loss, s/p prbc transfusion, EGD on 1/27, result noted, appreciate GI input, hold eliquis, on ppi, advance diet  HTN/HLD: continue imdur and lipitor, hold lasix/cozaar  PAF: on prn cardizem at home, eliquis held  Code Status: full  Family Communication: patient   Disposition Plan: home 1/28   Consultants:  GI  Procedures:  egd 1/27  Antibiotics:  none   Objective: BP 115/59 mmHg  Pulse 62  Temp(Src) 97.9 F (36.6 C) (Oral)  Resp 17  Ht 5\' 9"  (1.753 m)  Wt 97.977 kg (216 lb)  BMI 31.88 kg/m2  SpO2 99%  Intake/Output Summary (Last 24 hours) at 06/12/15 2004 Last data filed at 06/12/15 1700  Gross per 24 hour  Intake    720 ml  Output   1225 ml  Net   -505 ml   Filed Weights   06/11/15 0012 06/12/15 0909  Weight: 98.4 kg (216 lb 14.9 oz) 97.977 kg (216 lb)    Exam:   General:  NAD  Cardiovascular: RRR  Respiratory: CTABL  Abdomen: Soft/ND/NT, positive BS  Musculoskeletal: No Edema  Neuro: aaox3  Data Reviewed: Basic Metabolic Panel:  Recent Labs Lab 06/10/15 1038 06/10/15 1945 06/11/15 0635 06/12/15 0533  NA 137 141 143 141  K 4.4 4.4 4.5 4.3  CL 106 107 111 110  CO2 23 24 23 24   GLUCOSE 107* 112* 95 92  BUN 21 22* 20 14  CREATININE 1.04 1.43* 1.12 0.94  CALCIUM 8.7 9.0 8.6* 8.9  MG  --   --   --  2.0   Liver Function Tests:  Recent Labs Lab 06/10/15 1945  AST 23  ALT 16*  ALKPHOS 64  BILITOT 0.3  PROT 6.4*  ALBUMIN 3.7   No results for input(s): LIPASE, AMYLASE in the last 168 hours. No results for input(s): AMMONIA in the last 168 hours. CBC:  Recent Labs Lab 06/10/15 1038  06/10/15 1945 06/11/15 0635 06/12/15 0533  WBC 7.2 7.3 6.7 6.3  NEUTROABS 5.3  --   --   --   HGB 7.3* 6.8* 8.4* 8.7*  HCT 23.5* 22.3* 26.4* 27.8*  MCV 90.7 94.1 92.0 92.7  PLT 415* 376 302 297   Cardiac Enzymes:   No results for input(s): CKTOTAL, CKMB, CKMBINDEX, TROPONINI in the last 168 hours. BNP (last 3 results) No results for input(s): BNP in the last 8760 hours.  ProBNP (last 3 results) No results for input(s): PROBNP in the last 8760 hours.  CBG: No results for input(s): GLUCAP in the last 168 hours.  No results found for this or any previous visit (from the past 240 hour(s)).   Studies: No results found.  Scheduled Meds: . sodium chloride   Intravenous Once  . atorvastatin  40 mg Oral QPM  . ferrous sulfate  325 mg Oral Q breakfast  . isosorbide mononitrate  30 mg Oral Daily  . pantoprazole  40 mg Oral BID    Continuous Infusions:    Time spent: 15 mins  Mardene Lessig MD, PhD  [facility] Pager [phone]. If 7PM-7AM, please contact night-coverage at [email or web address], password [facility] 06/12/2015, 8:04 PM  LOS: 2 days

## 2015-06-12 NOTE — Interval H&P Note (Signed)
History and Physical Interval Note:  06/12/2015 9:51 AM  Donald [name]  has presented today for surgery, with the diagnosis of Recurrent anemia and heme positive stool on Eliquis  The various methods of treatment have been discussed with the patient and family. After consideration of risks, benefits and other options for treatment, the patient has consented to  Procedure(s): ENTEROSCOPY (N/A) as a surgical intervention .  The patient's history has been reviewed, patient examined, no change in status, stable for surgery.  I have reviewed the patient's chart and labs.  Questions were answered to the patient's satisfaction.     Pricilla Riffle. Fuller Plan

## 2015-06-12 NOTE — Progress Notes (Signed)
Patient has returned from Endo. He is at baseline orientation, VS stable 60,18, 148/64, 100%ra

## 2015-06-12 NOTE — Op Note (Signed)
[facility] [address], [address]   ENDOSCOPY PROCEDURE REPORT  PATIENT: Donald, [name]  MR#: [id number] BIRTHDATE: 1933-01-15 , 82  yrs. old GENDER: male ENDOSCOPIST: Ladene Artist, MD, [facility] REFERRED BY:  [facility] PROCEDURE DATE:  06/12/2015 PROCEDURE:  EGD, diagnostic ASA CLASS:     Class III INDICATIONS:  hemoccult positive stools and anemia. MEDICATIONS: Benadryl 50 mg IV, Fentanyl 50 mcg IV, Versed 4 mg IV, Moderate sedation provided from 0958 to 1012 TOPICAL ANESTHETIC: Cetacaine Spray DESCRIPTION OF PROCEDURE: After the risks benefits and alternatives of the procedure were thoroughly explained, informed consent was obtained.  The [place] [id number] endoscope was introduced through the mouth and advanced to the second portion of the duodenum , Without limitations.  The instrument was slowly withdrawn as the mucosa was fully examined.    ESOPHAGUS: There was a benign appearing stricture at the gastroesophageal junction.  The stricture was easily traversable. The esophagus otherwise appeared normal. STOMACH: Moderate erosive gastritis was found in the gastric fundus. There was adherent blood present on several of the erosions.  The stomach otherwise appeared normal. DUODENUM: The duodenal mucosa showed no abnormalities in the bulb and 2nd part of the duodenum.  Retroflexed views revealed a small hiatal hernia.     The scope was then withdrawn from the patient and the procedure completed.  COMPLICATIONS: There were no immediate complications.  ENDOSCOPIC IMPRESSION: 1.   Stricture at the gastroesophageal junction 2.   Erosive gastritis with adherent blood in the gastric fundus 3.   Small hiatal hernia  RECOMMENDATIONS: 1.  PPI bid 2.  Avoid all ASA/NSAID products 3.  Hold Eliquis for 7 days if possible 4.  Outpatient GI follow up with Dr. Hilarie Fredrickson as scheduled  eSigned:  Ladene Artist, MD, [facility] 06/12/2015 10:20  AM

## 2015-06-12 NOTE — H&P (View-Only) (Signed)
Referring Provider: No ref. provider found Primary Care Physician:  [name], MD Primary Gastroenterologist:  Dr. Hilarie Fredrickson  Reason for Consultation:  Anemia and heme positive stool  HPI: Donald [name] is a 82 y.o. male with h/o PAF, on eliquis (last does 1/25 AM). Patient presented to his cardiologist today with worsening DOE that had been ongoing for the past week or so. He notes his stool has been black in color, but he says that he is on iron as well. 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 AVM was cauterized under EGD by Dr. Fuller Plan. Patient had been restarted on eliquis.  HGB checked at cardiology office today and was 7.3 grams (repeat later was 6.8 grams), down from 11 grams just 3 weeks ago.  Has received 2 units PRBC's with Hgb increase to 8.4 grams.  Colonoscopy 12/8: Incomplete to hepatic flexure due to tortuous colon and inadequate sedation/pt discomfort. Descending polyp removed (sessile serrated polyp), sigmoid tics, grade 1 int/ext hemorrhoids.  EGD 12/8: non-obstructing GEjx stricture not dilated, oozing gastric body AVM with hemostasis post APC ablation.    Past Medical History  Diagnosis Date  . Hypertension   . Sinus bradycardia   . ED (erectile dysfunction)   . Arthropathy, unspecified, site unspecified   . Prostatitis, unspecified   . Allergic rhinitis due to pollen   . Diverticulosis     severe in descending, sigmoid colon.   . Skin cancer     basal and squamous cell  . Abnormal CT scan, stomach 02/2014    Thickening of gastric fundus and cardia.  gastritis on EGD 03/2014  . Atherosclerosis     a. Noted by abdominal CT 02/2014 (h/o normal nuc 2011).  . Habitual alcohol use   . OSA on CPAP   . Hyperlipidemia   . Obesity   . Atrial flutter ([place]) 02/24/14    s/p ablation 11/15  . GERD (gastroesophageal reflux disease) 03/2014      small HH and gastritis on EGD  . PAF (paroxysmal atrial fibrillation) ([place])   . Cardiomyopathy ([place])     tachy mediated - a. TEE (10/15):  EF 30%;  b. Echo after NSR restored (10/15):  mild LVH, EF 55-60%, mild AS, mild AI, mild MR, mild to mod LAE, mild RAE  . Esophageal stricture 03/2014    traversable with endoscope. not dilated.   . Gastric AVM   . Esophageal stricture   . Hemorrhoids   . GI bleed     Past Surgical History  Procedure Laterality Date  . Inguinal hernia repair      right  . Inguinal hernia repair      left  . Tonsillectomy    . Orif fracture of the elbow    . Squamous cell carcinoma excision    . Skin cancer excision  05/21/12    Squamous cell ca  . Hernia repair    . Tee without cardioversion N/A 02/24/2014    Procedure: TRANSESOPHAGEAL ECHOCARDIOGRAM (TEE);  Surgeon: Lelon Perla, MD;  Location: [facility] ENDOSCOPY;  Service: Cardiovascular;  Laterality: N/A;  . Cardioversion N/A 02/24/2014    Procedure: CARDIOVERSION;  Surgeon: Lelon Perla, MD;  Location: [facility] ENDOSCOPY;  Service: Cardiovascular;  Laterality: N/A;  . Ablation  03/19/2014    RFCA of atrial flutter by Dr Caryl Comes  . Esophagogastroduodenoscopy N/A 04/09/2014  Procedure: ESOPHAGOGASTRODUODENOSCOPY (EGD);  Surgeon: Inda Castle, MD;  Location: [place];  Service: Endoscopy;  Laterality: N/A;  . Atrial flutter ablation N/A 03/19/2014    Procedure: ATRIAL FLUTTER ABLATION;  Surgeon: Deboraha Sprang, MD;  Location: [facility] CATH LAB;  Service: Cardiovascular;  Laterality: N/A;  . Cardioversion Right 09/10/2014    Procedure: CARDIOVERSION;  Surgeon: Deboraha Sprang, MD;  Location: [facility] CATH LAB;  Service: Cardiovascular;  Laterality: Right;  . Colonoscopy N/A 04/23/2015    Procedure: COLONOSCOPY;  Surgeon: Ladene Artist, MD;  Location: [facility] ENDOSCOPY;  Service: Endoscopy;  Laterality: N/A;  . Esophagogastroduodenoscopy N/A 04/23/2015    Procedure: ESOPHAGOGASTRODUODENOSCOPY (EGD);  Surgeon: Ladene Artist, MD;  Location: [facility] ENDOSCOPY;  Service: Endoscopy;  Laterality: N/A;    Prior to Admission medications   Medication Sig Start Date End Date Taking? Authorizing Provider  apixaban (ELIQUIS) 5 MG TABS tablet Take 1 tablet (5 mg total) by mouth 2 (two) times daily. 04/30/15  Yes Janith Lima, MD  atorvastatin (LIPITOR) 40 MG tablet TAKE 1 TABLET ONCE DAILY. Patient taking differently: Take 1 tablet by mouth every evening. 01/29/15  Yes Janith Lima, MD  diltiazem (DILACOR XR) 240 MG 24 hr capsule Take 240 mg by mouth daily as needed (increased heart rate).   Yes Historical Provider, MD  ferrous sulfate 325 (65 FE) MG tablet Take 1 tablet (325 mg total) by mouth daily with breakfast. 04/25/15  Yes Reyne Dumas, MD  fluticasone (FLONASE) 50 MCG/ACT nasal spray Place 1 spray into both nostrils daily as needed for allergies or rhinitis. **SHAKE GENTLY**   Yes Historical Provider, MD  furosemide (LASIX) 40 MG tablet Take 1 tablet (40 mg total) by mouth daily. 05/01/15  Yes Reyne Dumas, MD  isosorbide mononitrate (IMDUR) 30 MG 24 hr tablet Take 1 tablet (30 mg total) by mouth daily. 06/10/15  Yes Isaiah Serge, NP  loratadine (CLARITIN) 10 MG tablet Take 10 mg by mouth daily as needed for allergies.    Yes Historical Provider, MD  losartan (COZAAR) 100 MG tablet TAKE 1 TABLET DAILY. Patient taking differently: Take 1 tablet by mouth daily in the evening. 05/04/15  Yes Deboraha Sprang, MD  MAGNESIUM PO Take 2 tablets by mouth 2 (two) times daily.   Yes Historical Provider, MD  Multiple Vitamins-Minerals (EYE VITAMINS PO) Take 1 tablet by mouth 2 (two) times daily. Macular Supplement   Yes Historical Provider, MD  nitroGLYCERIN (NITROSTAT) 0.4 MG SL tablet Place 1 tablet (0.4 mg total) under the tongue every 5 (five) minutes as needed for chest pain. 12/23/14  Yes Deboraha Sprang, MD  pantoprazole (PROTONIX) 40 MG tablet Take 1 tablet (40 mg total) by mouth 2 (two) times daily. 04/25/15  Yes Reyne Dumas, MD    Current Facility-Administered Medications  Medication Dose Route Frequency Provider Last Rate Last Dose  . 0.9 %  sodium chloride infusion   Intravenous Once Nat Christen, MD   Stopped at 06/10/15 2308  . atorvastatin (LIPITOR) tablet 40 mg  40 mg Oral QPM Etta Quill, DO   40 mg at 06/11/15 0043  . diltiazem (CARDIZEM CD) 24 hr capsule 240 mg  240 mg Oral Daily PRN Etta Quill, DO      . ferrous sulfate tablet 325 mg  325 mg Oral Q breakfast Jared M Gardner, DO      . fluticasone (FLONASE) 50 MCG/ACT nasal spray 1 spray  1 spray Each Nare Daily PRN  Etta Quill, DO      . isosorbide mononitrate (IMDUR) 24 hr tablet 30 mg  30 mg Oral Daily Etta Quill, DO      . loratadine (CLARITIN) tablet 10 mg  10 mg Oral Daily PRN Etta Quill, DO      . pantoprazole (PROTONIX) EC tablet 40 mg  40 mg Oral BID Etta Quill, DO   0 mg at 06/11/15 0044    Allergies as of 06/10/2015  . (No Known Allergies)    Family History  Problem Relation Age of Onset  . Cancer Mother     ovarian  . Other Father     renal disease- grief over loss of spouse  . Cancer Brother     prostate- died of hematologic disorder 2nd to chemo  . Diabetes Neg Hx   . Coronary artery disease Neg Hx   . Colon cancer Neg Hx   . Stomach cancer Neg Hx   . Rectal cancer Neg Hx   . Pulmonary fibrosis Brother     Social History   Social History  . Marital Status: Married    Spouse Name: N/A  . Number of Children: 3  . Years of Education: 16   Occupational History  . Land    Social History Main Topics  . Smoking status: Former Smoker -- 0.00 packs/day for 40 years    Types: Cigarettes, Cigars    Quit date: 09/18/2008  . Smokeless tobacco: Current User    Types: Chew     Comment: Has Occasional Cigar/ quit   . Alcohol Use: 12.6 oz/week    21 Glasses of wine per week  . Drug Use: No  . Sexual Activity: Not on file   Other Topics Concern  . 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 married 1961. 2 sons- '64, '63 , I daughter- '66, 4 grandchildren. work: Museum/gallery curator, retired but still consults. 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      No new stressors/     Review of Systems: Ten point ROS is O/W negative except as mentioned in HPI.  Physical Exam: Vital signs in last 24 hours: Temp:  [97.6 F (36.4 C)-98.4 F (36.9 C)] 97.8 F (36.6 C) (01/26 0429) Pulse Rate:  [66-88] 66 (01/26 0429) Resp:  [13-21] 20 (01/26 0429) BP: (99-122)/(57-80) 117/57 mmHg (01/26 0429) SpO2:  [95 %-100 %] 98 % (01/26 0429) Weight:  [216 lb 14.9 oz (98.4 kg)] 216 lb 14.9 oz (98.4 kg) (01/26 0012) Last BM Date: 06/10/15 (black stools, hemocult positive) General:  Alert, Well-developed, well-nourished, pleasant and cooperative in NAD Head:  Normocephalic and atraumatic. Eyes:  Sclera clear, no icterus.  Conjunctiva pink. Ears:  Normal auditory acuity. Mouth:  No deformity or lesions.   Lungs:  Clear throughout to auscultation.  No wheezes, crackles, or rhonchi.  Heart:  Regular rate and rhythm; no murmurs, clicks, rubs, or gallops. Abdomen:  Soft, non-distended.  BS present.  Non-tender. Rectal:  Deferred  Msk:  Symmetrical without gross deformities. Pulses:  Normal pulses noted. Extremities:  Without clubbing or edema. Neurologic:  Alert and  oriented x4;  grossly normal neurologically. Skin:  Intact without significant lesions or rashes. Psych:  Alert and cooperative. Normal mood and affect.  Intake/Output from previous day: 01/25 0701 - 01/26 0700 In: 920 [I.V.:250; Blood:670] Out: 275 [Urine:275]  Intake/Output this shift: Total I/O In: -  Out: 500 [Urine:500]  Lab Results:  Recent Labs  06/10/15 1038 06/10/15 1945 06/11/15 0635  WBC 7.2 7.3 6.7  HGB 7.3* 6.8* 8.4*  HCT 23.5* 22.3* 26.4*  PLT 415* 376 302    BMET  Recent Labs  06/10/15 1038 06/10/15 1945 06/11/15 0635  NA 137 141 143  K 4.4 4.4 4.5  CL 106 107 111  CO2 23 24 23   GLUCOSE 107* 112* 95  BUN 21 22* 20  CREATININE 1.04 1.43* 1.12  CALCIUM 8.7 9.0 8.6*   LFT  Recent Labs  06/10/15 1945  PROT 6.4*  ALBUMIN 3.7  AST 23  ALT 16*  ALKPHOS 64  BILITOT 0.3   IMPRESSION:  * Recurrent anemia in pt on Eliquis. FOBT + but no overt bleeding.  Had oozing gastric AVM on EGD in 04/2015.  Hgb found to be 7.3 grams on outpatient labs.  S/p 2 units PRBC's with increase in hgb to 8.4 grams.   * OSA on CPAP   * Chronic Eliquis for hx a fib, s/p ablation and DCCV. Currently in NSR.  Has not taken Eliquis since 1/25 AM.  PLAN: -Will plan for repeat EGD/enteroscopy 1/27 to allow for 2 days of Eliquis wash-out.  Ok for clear liquids today then NPO after midnight. -Monitor Hgb and transfuse prn to keep Hgb > 8  ZEHR, JESSICA D.  06/11/2015, 10:05 AM  Pager number [phone]      Attending physician's note   I have taken an interval history, reviewed the chart and examined the patient. I agree with the Advanced Practitioner's note, impression and recommendations. Recurrent heme + stool and progressively worsening anemia indicating a slow GI bleed. Suspect he has other AVMs. EGD/enteroscopy tomorrow after 2 days Eliquis wash out. Pending findings may need further evaluation with capsule endoscopy and colonoscopy with MAC-these could be done as outpatient if he remains stable.   Donald Edward, MD [facility] [phone] Mon-Fri 8a-5p [phone] after 5p, weekends, holidays

## 2015-06-13 ENCOUNTER — Encounter: Payer: Self-pay | Admitting: Internal Medicine

## 2015-06-13 DIAGNOSIS — I471 Supraventricular tachycardia: Secondary | ICD-10-CM

## 2015-06-13 DIAGNOSIS — Z7901 Long term (current) use of anticoagulants: Secondary | ICD-10-CM | POA: Diagnosis not present

## 2015-06-13 DIAGNOSIS — I10 Essential (primary) hypertension: Secondary | ICD-10-CM | POA: Diagnosis not present

## 2015-06-13 DIAGNOSIS — K921 Melena: Secondary | ICD-10-CM | POA: Diagnosis not present

## 2015-06-13 DIAGNOSIS — D500 Iron deficiency anemia secondary to blood loss (chronic): Secondary | ICD-10-CM | POA: Diagnosis not present

## 2015-06-13 LAB — CBC
HCT: 28.4 % — ABNORMAL LOW (ref 39.0–52.0)
HEMOGLOBIN: 9 g/dL — AB (ref 13.0–17.0)
MCH: 29.2 pg (ref 26.0–34.0)
MCHC: 31.7 g/dL (ref 30.0–36.0)
MCV: 92.2 fL (ref 78.0–100.0)
Platelets: 290 10*3/uL (ref 150–400)
RBC: 3.08 MIL/uL — AB (ref 4.22–5.81)
RDW: 17.4 % — ABNORMAL HIGH (ref 11.5–15.5)
WBC: 7.3 10*3/uL (ref 4.0–10.5)

## 2015-06-13 LAB — BASIC METABOLIC PANEL
Anion gap: 9 (ref 5–15)
BUN: 13 mg/dL (ref 6–20)
CHLORIDE: 110 mmol/L (ref 101–111)
CO2: 23 mmol/L (ref 22–32)
Calcium: 9.2 mg/dL (ref 8.9–10.3)
Creatinine, Ser: 1.02 mg/dL (ref 0.61–1.24)
GFR calc Af Amer: >60 mL/min (ref >60)
GFR calc non Af Amer: >60 mL/min (ref >60)
GLUCOSE: 96 mg/dL (ref 65–99)
POTASSIUM: 4.4 mmol/L (ref 3.5–5.1)
Sodium: 142 mmol/L (ref 135–145)

## 2015-06-13 MED ORDER — APIXABAN 5 MG PO TABS: 5.0000 mg | ORAL_TABLET | Freq: Two times a day (BID) | ORAL | Status: DC

## 2015-06-13 NOTE — Discharge Summary (Signed)
Discharge Summary  Donald [name] [id number] DOB: 1932/12/30  PCP: [name], MD  Admit date: 06/10/2015 Discharge date: 06/13/2015  Time spent: <60mins  Recommendations for Outpatient Follow-up:  1. F/u with GI on 2/2, GI to advise on resuming eliquis. 2. F/u with cardiology on 2/20 3. F/u with pmd within in two weeks for hospital discharge follow up. Repeat cbc at follow up.  Discharge Diagnoses:  Active Hospital Problems   Diagnosis Date Noted  . Anemia due to blood loss 04/21/2015  . Melena 06/10/2015  . Anticoagulant long-term use     Resolved Hospital Problems   Diagnosis Date Noted Date Resolved  No resolved problems to display.    Discharge Condition: stable  Diet recommendation: heart healthy  Filed Weights   06/11/15 0012 06/12/15 0909  Weight: 98.4 kg (216 lb 14.9 oz) 97.977 kg (216 lb)    History of present illness:  Donald [name] is a 82 y.o. male with h/o PAF, on eliquis and PRN cardizem. Patient presented to his cardiologist today with worsening DOE that had been ongoing for the past week or so. He notes his stool has been black in color. 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 AVM was cauterized under EGD by Dr. Fuller Plan. Patient had been restarted on eliquis.  HGB checked at cardiology office today and was 6.8, down from 11 from where it had recovered to after the December GIB.  Hospital Course:  Principal Problem:   Anemia due to blood loss Active Problems:   Anticoagulant long-term use   Melena  Melena, anemia due to acute on chronic blood loss, s/p prbc transfusion, EGD on 1/27, result noted, appreciate GI input, hold eliquis, on ppi, advance diet  HTN/HLD: continue imdur and lipitor, hold lasix/cozaar  PAF: on prn cardizem at home, eliquis held  Code Status: full  Family Communication: patient    Disposition Plan: home 1/28   Consultants:  GI  Procedures:  egd 1/27  Antibiotics:  None   Discharge Exam: BP 143/62 mmHg  Pulse 62  Temp(Src) 97.4 F (36.3 C) (Oral)  Resp 16  Ht 5\' 9"  (1.753 m)  Wt 97.977 kg (216 lb)  BMI 31.88 kg/m2  SpO2 100%   General: NAD  Cardiovascular: RRR  Respiratory: CTABL  Abdomen: Soft/ND/NT, positive BS  Musculoskeletal: No Edema  Neuro: aaox3   Discharge Instructions You were cared for by a hospitalist during your hospital stay. If you have any questions about your discharge medications or the care you received while you were in the hospital after you are discharged, you can call the unit and asked to speak with the hospitalist on call if the hospitalist that took care of you is not available. Once you are discharged, your primary care physician will handle any further medical issues. Please note that NO REFILLS for any discharge medications will be authorized once you are discharged, as it is imperative that you return to your primary care physician (or establish a relationship with a primary care physician if you do not have one) for your aftercare needs so that they can reassess your need for medications and monitor your lab values.      Discharge Instructions    Diet - low sodium heart healthy    Complete by:  As directed      Increase activity slowly    Complete by:  As directed             Medication List    TAKE these medications        apixaban 5 MG Tabs tablet  Commonly known as:  ELIQUIS  Take 1 tablet (5 mg total) by mouth 2 (two) times daily. Hold for seven days, resume if ok with GI.     atorvastatin 40 MG tablet  Commonly known as:  LIPITOR  TAKE 1 TABLET ONCE DAILY.     diltiazem 240 MG 24 hr capsule  Commonly known as:  DILACOR XR  Take 240 mg by mouth daily as needed (increased heart rate).     EYE VITAMINS PO  Take 1 tablet by mouth 2 (two) times daily. Macular Supplement     ferrous  sulfate 325 (65 FE) MG tablet  Take 1 tablet (325 mg total) by mouth daily with breakfast.     fluticasone 50 MCG/ACT nasal spray  Commonly known as:  FLONASE  Place 1 spray into both nostrils daily as needed for allergies or rhinitis. **SHAKE GENTLY**     furosemide 40 MG tablet  Commonly known as:  LASIX  Take 1 tablet (40 mg total) by mouth daily.     isosorbide mononitrate 30 MG 24 hr tablet  Commonly known as:  IMDUR  Take 1 tablet (30 mg total) by mouth daily.     loratadine 10 MG tablet  Commonly known as:  CLARITIN  Take 10 mg by mouth daily as needed for allergies.     losartan 100 MG tablet  Commonly known as:  COZAAR  TAKE 1 TABLET DAILY.     MAGNESIUM PO  Take 2 tablets by mouth 2 (two) times daily.     nitroGLYCERIN 0.4 MG SL tablet  Commonly known as:  NITROSTAT  Place 1 tablet (0.4 mg total) under the tongue every 5 (five) minutes as needed for chest pain.     pantoprazole 40 MG tablet  Commonly known as:  PROTONIX  Take 1 tablet (40 mg total) by mouth 2 (two) times daily.       No Known Allergies Follow-up Information    Follow up with Jerene Bears, MD On 06/18/2015.   Specialty:  Gastroenterology   Why:  gi follow up   Contact information:   [address] [place] [address] [phone]       Follow up with [name], MD In 2 weeks.   Specialty:  Internal Medicine   Why:  hospital discharge follow up, repeat labs (cbc) at follow up   Contact information:   [address] [address] [phone]        The results of significant diagnostics from this hospitalization (including imaging, microbiology, ancillary and laboratory) are listed below for reference.    Significant Diagnostic Studies: No results found.  Microbiology: No results found for this or any previous visit (from the past 240 hour(s)).   Labs: Basic Metabolic Panel:  Recent Labs Lab 06/10/15 1038 06/10/15 1945 06/11/15 0635  06/12/15 0533 06/13/15 0556  NA 137 141 143 141 142  K 4.4 4.4 4.5 4.3 4.4  CL 106 107 111 110 110  CO2 23 24 23 24 23   GLUCOSE 107* 112* 95 92 96  BUN 21 22* 20 14 13   CREATININE 1.04 1.43* 1.12 0.94 1.02  CALCIUM 8.7 9.0 8.6* 8.9 9.2  MG  --   --   --  2.0  --  Liver Function Tests:  Recent Labs Lab 06/10/15 1945  AST 23  ALT 16*  ALKPHOS 64  BILITOT 0.3  PROT 6.4*  ALBUMIN 3.7   No results for input(s): LIPASE, AMYLASE in the last 168 hours. No results for input(s): AMMONIA in the last 168 hours. CBC:  Recent Labs Lab 06/10/15 1038 06/10/15 1945 06/11/15 0635 06/12/15 0533 06/13/15 0556  WBC 7.2 7.3 6.7 6.3 7.3  NEUTROABS 5.3  --   --   --   --   HGB 7.3* 6.8* 8.4* 8.7* 9.0*  HCT 23.5* 22.3* 26.4* 27.8* 28.4*  MCV 90.7 94.1 92.0 92.7 92.2  PLT 415* 376 302 297 290   Cardiac Enzymes: No results for input(s): CKTOTAL, CKMB, CKMBINDEX, TROPONINI in the last 168 hours. BNP: BNP (last 3 results) No results for input(s): BNP in the last 8760 hours.  ProBNP (last 3 results) No results for input(s): PROBNP in the last 8760 hours.  CBG: No results for input(s): GLUCAP in the last 168 hours.     SignedFlorencia Reasons MD, PhD  [facility] 06/13/2015, 9:19 AM

## 2015-06-13 NOTE — Progress Notes (Signed)
Pt discharged to home. DC instructions given. No concerns voiced. Prescription x 1 given for anticoagulant. Pt left unit in wheelchair pushed by nurse tech. Left in good condition. No concerns voiced. VWilliams,rn.

## 2015-06-15 ENCOUNTER — Encounter (HOSPITAL_COMMUNITY): Payer: Self-pay | Admitting: Gastroenterology

## 2015-06-15 ENCOUNTER — Encounter: Payer: Self-pay | Admitting: Internal Medicine

## 2015-06-15 DIAGNOSIS — G4733 Obstructive sleep apnea (adult) (pediatric): Secondary | ICD-10-CM | POA: Diagnosis not present

## 2015-06-16 ENCOUNTER — Other Ambulatory Visit: Payer: Self-pay

## 2015-06-16 DIAGNOSIS — D62 Acute posthemorrhagic anemia: Secondary | ICD-10-CM

## 2015-06-16 NOTE — Telephone Encounter (Signed)
Please ask patient to come for CBC this Friday after recent hospitalization with heme positive anemia found to have gastritis He should continue oral iron Decision regarding anticoagulation to be based on lab results

## 2015-06-17 NOTE — Telephone Encounter (Signed)
Certainly fine to have labs this Thursday Please notify the patient.

## 2015-06-18 ENCOUNTER — Other Ambulatory Visit (INDEPENDENT_AMBULATORY_CARE_PROVIDER_SITE_OTHER): Payer: PPO

## 2015-06-18 ENCOUNTER — Encounter: Payer: Self-pay | Admitting: Internal Medicine

## 2015-06-18 ENCOUNTER — Other Ambulatory Visit: Payer: Self-pay

## 2015-06-18 ENCOUNTER — Ambulatory Visit (INDEPENDENT_AMBULATORY_CARE_PROVIDER_SITE_OTHER): Payer: PPO | Admitting: Internal Medicine

## 2015-06-18 VITALS — BP 100/50 | HR 72 | Ht 66.75 in | Wt 216.2 lb

## 2015-06-18 DIAGNOSIS — Z7901 Long term (current) use of anticoagulants: Secondary | ICD-10-CM

## 2015-06-18 DIAGNOSIS — D509 Iron deficiency anemia, unspecified: Secondary | ICD-10-CM | POA: Diagnosis not present

## 2015-06-18 DIAGNOSIS — K2900 Acute gastritis without bleeding: Secondary | ICD-10-CM | POA: Diagnosis not present

## 2015-06-18 DIAGNOSIS — K2960 Other gastritis without bleeding: Secondary | ICD-10-CM

## 2015-06-18 DIAGNOSIS — D62 Acute posthemorrhagic anemia: Secondary | ICD-10-CM | POA: Diagnosis not present

## 2015-06-18 LAB — CBC WITH DIFFERENTIAL/PLATELET
BASOS PCT: 0.4 % (ref 0.0–3.0)
Basophils Absolute: 0 10*3/uL (ref 0.0–0.1)
EOS ABS: 0.1 10*3/uL (ref 0.0–0.7)
Eosinophils Relative: 1.8 % (ref 0.0–5.0)
HCT: 32.5 % — ABNORMAL LOW (ref 39.0–52.0)
HEMOGLOBIN: 10.5 g/dL — AB (ref 13.0–17.0)
Lymphocytes Relative: 20.4 % (ref 12.0–46.0)
Lymphs Abs: 1.7 10*3/uL (ref 0.7–4.0)
MCHC: 32.2 g/dL (ref 30.0–36.0)
MCV: 89.9 fl (ref 78.0–100.0)
MONO ABS: 0.8 10*3/uL (ref 0.1–1.0)
Monocytes Relative: 9.7 % (ref 3.0–12.0)
NEUTROS ABS: 5.5 10*3/uL (ref 1.4–7.7)
Neutrophils Relative %: 67.7 % (ref 43.0–77.0)
PLATELETS: 268 10*3/uL (ref 150.0–400.0)
RBC: 3.62 Mil/uL — ABNORMAL LOW (ref 4.22–5.81)
RDW: 18 % — AB (ref 11.5–15.5)
WBC: 8.1 10*3/uL (ref 4.0–10.5)

## 2015-06-18 LAB — IBC PANEL
IRON: 373 ug/dL — AB (ref 42–165)
Saturation Ratios: 86.8 % — ABNORMAL HIGH (ref 20.0–50.0)
TRANSFERRIN: 307 mg/dL (ref 212.0–360.0)

## 2015-06-18 LAB — FERRITIN: FERRITIN: 26 ng/mL (ref 22.0–322.0)

## 2015-06-18 NOTE — Progress Notes (Signed)
Subjective:    Patient ID: Donald [name], male    DOB: [birth year], 82 y.o.   MRN: [id number]  HPI Mr. Vasicek is an 82 year old male with past medical history of iron deficiency anemia and 2 hospitalizations since early December 2016 for symptomatically anemia and heme positive stool, who is seen for hospital follow-up. In early December he was admitted with iron deficiency, heme positive stool requiring transfusion and IV iron. At that time upper endoscopy was performed which showed nonobstructing Schatzki's ring, using gastric body AVM treated with APC. Colonoscopy was performed an incomplete to the hepatic flexure due to tortuosity. A sessile serrated polyp was removed from the descending colon. Sigmoid diverticulosis and grade 1 internal hemorrhoids were seen. He was then readmitted recently again with symptomatically anemia and heme positive stool. He required blood transfusion and repeat upper endoscopy was performed on 06/12/2015 by Dr. Fuller Plan. Erosive gastritis with adherent blood was seen in the gastric fundus. He was discharged on oral iron and follows up today. All of the bleeding was in the setting of Eliquis which she uses for PAF.  Prior to this last admission he had seen black sticky stool different from his dark stool which is been present since he began oral iron in December. Since discharge he seen no further melena stools remain dark but formed. Previously he had had trouble with abdominal bloating and fecal smearing which has resolved. He reports energy levels have improved. He was able to go to [employer] today to work out and he has resumed his volunteer activities with [employer].  Hemoglobin at discharge was 9.0  He has follow-up with his cardiologist, Dr. Caryl Comes, on 07/06/2015. Eliquis has remained on hold. He has taken pantoprazole 40 mg twice a day. He is taking oral iron 325 mg once daily  Review of Systems As per history of present illness, otherwise  negative  Current Medications, Allergies, Past Medical History, Past Surgical History, Family History and Social History were reviewed in [employer] record.     Objective:   Physical Exam BP 100/50 mmHg  Pulse 72  Ht 5' 6.75" (1.695 m)  Wt 216 lb 4 oz (98.09 kg)  BMI 34.14 kg/m2 Constitutional: Well-developed and well-nourished. No distress. HEENT: Normocephalic and atraumatic.  Conjunctivae are normal.  No scleral icterus. Neck: Neck supple. Trachea midline. Cardiovascular: Normal rate, regular rhythm and intact distal pulses. No M/R/G Pulmonary/chest: Effort normal and breath sounds normal. No wheezing, rales or rhonchi. Abdominal: Soft, nontender, nondistended. Bowel sounds active throughout.  Extremities: no clubbing, cyanosis, or edema Neurological: Alert and oriented to person place and time. Skin: Skin is warm and dry.  Psychiatric: Normal mood and affect. Behavior is normal.  CBC Latest Ref Rng 06/13/2015 06/12/2015 06/11/2015  WBC 4.0 - 10.5 K/uL 7.3 6.3 6.7  Hemoglobin 13.0 - 17.0 g/dL 9.0(L) 8.7(L) 8.4(L)  Hematocrit 39.0 - 52.0 % 28.4(L) 27.8(L) 26.4(L)  Platelets 150 - 400 K/uL 290 297 302    Iron/TIBC/Ferritin/ %Sat    Component Value Date/Time   IRON 13* 04/23/2015 1200   TIBC 386 04/23/2015 1200   FERRITIN 11* 04/23/2015 1200   IRONPCTSAT 3* 04/23/2015 1200   CMP     Component Value Date/Time   NA 142 06/13/2015 0556   K 4.4 06/13/2015 0556   CL 110 06/13/2015 0556   CO2 23 06/13/2015 0556   GLUCOSE 96 06/13/2015 0556   BUN 13 06/13/2015 0556   CREATININE 1.02 06/13/2015 0556   CREATININE  1.04 06/10/2015 1038   CALCIUM 9.2 06/13/2015 0556   PROT 6.4* 06/10/2015 1945   ALBUMIN 3.7 06/10/2015 1945   AST 23 06/10/2015 1945   ALT 16* 06/10/2015 1945   ALKPHOS 64 06/10/2015 1945   BILITOT 0.3 06/10/2015 1945   GFRNONAA >60 06/13/2015 0556   GFRAA >60 06/13/2015 0556        Assessment & Plan:  82 year old male with past  medical history of iron deficiency anemia and 2 hospitalizations since early December 2016 for symptomatically anemia and heme positive stool, who is seen for hospital follow-up.  1. Erosive gastritis/IDA/chronic anticoagulation -- 2 hospitalizations since early December 2016 for symptomatically anemia requiring transfusion and heme positive stool in the setting now class. Gastric angiodysplasia treated in December and recently erosive gastritis seen by EGD. He seems to be doing better. I recommended he continue to resolve 40 mg twice a day given gastritis seen recently. Repeat CBC and iron studies today. If I remains low after 2 months of oral therapy, I recommend IV iron. We also discussed consideration of video capsule endoscopy to examine the small bowel thoroughly to exclude other possible bleeding sources to explain anemia and heme-positive stool. Colonoscopy was complete only to the hepatic flexure and thus there is an area of the colon that has been on examine. We discussed this today. Repeat colonoscopy considered but after our discussion deferred for now pending labs and possible capsule. For now he will hold Eliquis though would like to resume this if possible given history of PAF. He will follow-up with Dr. Caryl Comes. I will cc this note to Dr. Caryl Comes.  2. Fecal smearing -- no longer an issue and possibly related to more formed stools now that he is on oral iron therapy.

## 2015-06-18 NOTE — Patient Instructions (Signed)
Your physician has requested that you go to the basement for the following lab work before leaving today: Ferritin, IBC  Continue Protonix twice daily.  Hold eliquis for now.  We may complete a video capsule endoscopy and/or IV iron pending your lab results.  Follow up with Dr Hilarie Fredrickson in 3 months.

## 2015-06-22 ENCOUNTER — Encounter: Payer: Self-pay | Admitting: Internal Medicine

## 2015-06-25 ENCOUNTER — Other Ambulatory Visit (INDEPENDENT_AMBULATORY_CARE_PROVIDER_SITE_OTHER): Payer: PPO

## 2015-06-25 DIAGNOSIS — D509 Iron deficiency anemia, unspecified: Secondary | ICD-10-CM

## 2015-06-25 LAB — CBC WITH DIFFERENTIAL/PLATELET
BASOS ABS: 0 10*3/uL (ref 0.0–0.1)
BASOS PCT: 0.4 % (ref 0.0–3.0)
Eosinophils Absolute: 0.2 10*3/uL (ref 0.0–0.7)
Eosinophils Relative: 2.5 % (ref 0.0–5.0)
HEMATOCRIT: 32.9 % — AB (ref 39.0–52.0)
HEMOGLOBIN: 10.6 g/dL — AB (ref 13.0–17.0)
LYMPHS PCT: 24.8 % (ref 12.0–46.0)
Lymphs Abs: 1.8 10*3/uL (ref 0.7–4.0)
MCHC: 32.3 g/dL (ref 30.0–36.0)
MCV: 90.1 fl (ref 78.0–100.0)
MONOS PCT: 9.3 % (ref 3.0–12.0)
Monocytes Absolute: 0.7 10*3/uL (ref 0.1–1.0)
NEUTROS ABS: 4.6 10*3/uL (ref 1.4–7.7)
Neutrophils Relative %: 63 % (ref 43.0–77.0)
PLATELETS: 241 10*3/uL (ref 150.0–400.0)
RBC: 3.65 Mil/uL — ABNORMAL LOW (ref 4.22–5.81)
RDW: 17.8 % — ABNORMAL HIGH (ref 11.5–15.5)
WBC: 7.4 10*3/uL (ref 4.0–10.5)

## 2015-06-26 ENCOUNTER — Other Ambulatory Visit: Payer: Self-pay

## 2015-06-26 DIAGNOSIS — K921 Melena: Secondary | ICD-10-CM

## 2015-06-29 ENCOUNTER — Other Ambulatory Visit: Payer: Self-pay | Admitting: Internal Medicine

## 2015-07-04 DIAGNOSIS — G4733 Obstructive sleep apnea (adult) (pediatric): Secondary | ICD-10-CM | POA: Diagnosis not present

## 2015-07-06 ENCOUNTER — Encounter: Payer: Self-pay | Admitting: Internal Medicine

## 2015-07-06 ENCOUNTER — Ambulatory Visit (INDEPENDENT_AMBULATORY_CARE_PROVIDER_SITE_OTHER): Payer: PPO | Admitting: Internal Medicine

## 2015-07-06 VITALS — BP 124/62 | HR 57 | Ht 66.75 in | Wt 220.0 lb

## 2015-07-06 DIAGNOSIS — I480 Paroxysmal atrial fibrillation: Secondary | ICD-10-CM | POA: Diagnosis not present

## 2015-07-06 DIAGNOSIS — I483 Typical atrial flutter: Secondary | ICD-10-CM

## 2015-07-06 DIAGNOSIS — I5030 Unspecified diastolic (congestive) heart failure: Secondary | ICD-10-CM

## 2015-07-06 MED ORDER — APIXABAN 5 MG PO TABS: 5.0000 mg | ORAL_TABLET | Freq: Two times a day (BID) | ORAL | Status: DC

## 2015-07-06 NOTE — Progress Notes (Signed)
Patient Care Team: Janith Lima, MD as PCP - General (Internal Medicine) Franchot Gallo, MD (Urology) [name] [name], MD (Dermatology) Monna Fam, MD as Consulting Physician (Ophthalmology) Deboraha Sprang, MD as Consulting Physician (Cardiology)   HPI  Donald [name] is a 82 y.o. male Seem in followup exercise associated chest pain He was seen November 2015 with evidence of rapid atrial fibrillation with a course flutter-like appearance.  This recurred 4/16.  April 16 he was noted to have recurrent atrial fibrillation and underwent cardioversion. He was unaware of his tachycardia. He noted no change in his functional status with the atrial fibrillation.  October 2015 Echo - Left ventricle: The cavity size was normal. Wall thickness was increased in a pattern of mild LVH. Systolic function was normal. The estimated ejection fraction was in the range of 55% to 60%.<<30% (10/15)  Aortic valve: Heavily calcirfied right coronary cusp. There was mild stenosis. There was mild regurgitation.BICUSPID mean gradient was 10.  - Mitral valve: There was mild regurgitation. - Left atrium: The atrium was mildly to moderately dilated. - Right atrium: The atrium was mildly dilated. - Atrial septum: No defect or patent foramen ovale was identified.   He is noted improved exercise tolerance. He is not having chest discomfort when he walks. He did have one episode of neck discomfort this time associated with tachypalpitations. His home monitor demonstrated a heart rate 178. He to Cardizem and it terminated.   myoview 10/15 failed to do exercise portion 2/2 arrhythmia , rest study was normal  stress imaging 6/16 showed  [place] ischemia  He is having no exertional chest pain.   He was hospitalized 1/17 for an upper GI bleed. 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 Fuller Plan (records reviewed). Apixaban was resumed; follow-up with GI as scheduled.  Past  Medical History  Diagnosis Date  . Hypertension   . Sinus bradycardia   . ED (erectile dysfunction)   . Arthropathy, unspecified, site unspecified   . Prostatitis, unspecified   . Allergic rhinitis due to pollen   . Diverticulosis     severe in descending, sigmoid colon.   . Skin cancer     basal and squamous cell  . Abnormal CT scan, stomach 02/2014    Thickening of gastric fundus and cardia.  gastritis on EGD 03/2014  . Atherosclerosis     a. Noted by abdominal CT 02/2014 (h/o normal nuc 2011).  . Habitual alcohol use   . OSA on CPAP   . Hyperlipidemia   . Obesity   . Atrial flutter ([place]) 02/24/14    s/p ablation 11/15  . GERD (gastroesophageal reflux disease) 03/2014    small HH and gastritis on EGD  . PAF (paroxysmal atrial fibrillation) ([place])   . Cardiomyopathy ([place])     tachy mediated - a. TEE (10/15):  EF 30%;  b. Echo after NSR restored (10/15):  mild LVH, EF 55-60%, mild AS, mild AI, mild MR, mild to mod LAE, mild RAE  . Esophageal stricture 03/2014    traversable with endoscope. not dilated.   . Gastric AVM   . Esophageal stricture   . Hemorrhoids   . GI bleed     Past Surgical History  Procedure Laterality Date  . Inguinal hernia repair      right  . Inguinal hernia repair      left  . Tonsillectomy    . Orif fracture of the elbow    .  Squamous cell carcinoma excision    . Skin cancer excision  05/21/12    Squamous cell ca  . Hernia repair    . Tee without cardioversion N/A 02/24/2014    Procedure: TRANSESOPHAGEAL ECHOCARDIOGRAM (TEE);  Surgeon: Lelon Perla, MD;  Location: [facility] ENDOSCOPY;  Service: Cardiovascular;  Laterality: N/A;  . Cardioversion N/A 02/24/2014    Procedure: CARDIOVERSION;  Surgeon: Lelon Perla, MD;  Location: [facility] ENDOSCOPY;  Service: Cardiovascular;  Laterality: N/A;  . Ablation  03/19/2014    RFCA of atrial flutter by Dr Caryl Comes  . Esophagogastroduodenoscopy N/A 04/09/2014    Procedure: ESOPHAGOGASTRODUODENOSCOPY (EGD);   Surgeon: Inda Castle, MD;  Location: [facility];  Service: Endoscopy;  Laterality: N/A;  . Atrial flutter ablation N/A 03/19/2014    Procedure: ATRIAL FLUTTER ABLATION;  Surgeon: Deboraha Sprang, MD;  Location: [facility] CATH LAB;  Service: Cardiovascular;  Laterality: N/A;  . Cardioversion Right 09/10/2014    Procedure: CARDIOVERSION;  Surgeon: Deboraha Sprang, MD;  Location: [facility] CATH LAB;  Service: Cardiovascular;  Laterality: Right;  . Colonoscopy N/A 04/23/2015    Procedure: COLONOSCOPY;  Surgeon: Ladene Artist, MD;  Location: [facility] ENDOSCOPY;  Service: Endoscopy;  Laterality: N/A;  . Esophagogastroduodenoscopy N/A 04/23/2015    Procedure: ESOPHAGOGASTRODUODENOSCOPY (EGD);  Surgeon: Ladene Artist, MD;  Location: [facility] ENDOSCOPY;  Service: Endoscopy;  Laterality: N/A;  . Enteroscopy N/A 06/12/2015    Procedure: ENTEROSCOPY;  Surgeon: Ladene Artist, MD;  Location: [facility] ENDOSCOPY;  Service: Endoscopy;  Laterality: N/A;    Current Outpatient Prescriptions  Medication Sig Dispense Refill  . apixaban (ELIQUIS) 5 MG TABS tablet Take 1 tablet (5 mg total) by mouth 2 (two) times daily. Hold for seven days, resume if ok with GI. 60 tablet 3  . atorvastatin (LIPITOR) 40 MG tablet TAKE 1 TABLET ONCE DAILY. (Patient taking differently: Take 1 tablet by mouth every evening.) 90 tablet 3  . diltiazem (DILACOR XR) 240 MG 24 hr capsule Take 240 mg by mouth daily as needed (increased heart rate).    . ferrous sulfate 325 (65 FE) MG tablet Take 1 tablet (325 mg total) by mouth daily with breakfast. 60 tablet 3  . fluticasone (FLONASE) 50 MCG/ACT nasal spray Place 1 spray into both nostrils daily as needed for allergies or rhinitis. **SHAKE GENTLY**    . furosemide (LASIX) 40 MG tablet Take 1 tablet (40 mg total) by mouth daily. 30 tablet 5  . isosorbide mononitrate (IMDUR) 30 MG 24 hr tablet Take 1 tablet (30 mg total) by mouth daily. 90 tablet 3  . loratadine (CLARITIN) 10 MG tablet Take 10 mg by mouth daily as needed  for allergies.     Marland Kitchen losartan (COZAAR) 100 MG tablet TAKE 1 TABLET DAILY. (Patient taking differently: Take 1 tablet by mouth daily in the evening.) 90 tablet 3  . MAGNESIUM PO Take 2 tablets by mouth 2 (two) times daily.    . Multiple Vitamins-Minerals (EYE VITAMINS PO) Take 1 tablet by mouth 2 (two) times daily. Macular Supplement    . nitroGLYCERIN (NITROSTAT) 0.4 MG SL tablet Place 1 tablet (0.4 mg total) under the tongue every 5 (five) minutes as needed for chest pain. 25 tablet 1  . pantoprazole (PROTONIX) 40 MG tablet TAKE 1 TABLET TWICE DAILY. 60 tablet 2   No current facility-administered medications for this visit.    No Known Allergies  Review of Systems negative except from HPI and PMH  Physical Exam BP 124/62 mmHg  Pulse  57  Ht 5' 6.75" (1.695 m)  Wt 220 lb (99.791 kg)  BMI 34.73 kg/m2 Well developed and well nourished in no acute distress HENT normal E scleral and icterus clear Neck Supple JVP flat carotids brisk and full Clear to ausculation RRR with 2/6  murmurs Soft with active bowel sounds No clubbing cyanosis 1+Edema Alert and oriented, grossly normal motor and sensory function Skin Warm and Dry   ECG demonstrates sinus rhythm at  57 14/09/41 LVH by voltage  Assessment and  Plan  Atrial fibrillation/flutter-paroxysmal  CHADS-VASc score 5 (age-15 hypertension-1 cardiomyopathy-1 vascular disease-1) Tachypalpitations  Aortic Valve Bicuspid  Cardiomyopathy-resolved  Hypertension  Abnormal Myoview scan evidence of ischemia   HFpEF    Mild volume overload;  He will use his diuretic additional dosed as needed   He is much improved. We will continue using when necessary nitroglycerin.  He is better following treatment of his anemia. He is to see GI in a couple of weeks to review ongoing use of anticoagulation.    Discussed at warfarin may have lower GI bleeding risks compared to apixaban to have higher cerebral bleeding risks.  I hope that he  will be able to continue his NOAC.  We reviewed the presence of coronary disease on his Myoview which was associated with modest ischemia. However, currently in the absence of symptoms there would be no Indication for catheterization or intervention

## 2015-07-06 NOTE — Patient Instructions (Signed)
Medication Instructions: - Your physician recommends that you continue on your current medications as directed. Please refer to the Current Medication list given to you today.  Labwork: - none  Procedures/Testing: - none  Follow-Up: - Your physician wants you to follow-up in: 1 year with Dr. Klein. You will receive a reminder letter in the mail two months in advance. If you don't receive a letter, please call our office to schedule the follow-up appointment.   Any Additional Special Instructions Will Be Listed Below (If Applicable).     If you need a refill on your cardiac medications before your next appointment, please call your pharmacy.   

## 2015-07-09 ENCOUNTER — Other Ambulatory Visit (INDEPENDENT_AMBULATORY_CARE_PROVIDER_SITE_OTHER): Payer: PPO

## 2015-07-09 DIAGNOSIS — K921 Melena: Secondary | ICD-10-CM | POA: Diagnosis not present

## 2015-07-09 LAB — CBC WITH DIFFERENTIAL/PLATELET
BASOS PCT: 0.5 % (ref 0.0–3.0)
Basophils Absolute: 0 10*3/uL (ref 0.0–0.1)
EOS ABS: 0.2 10*3/uL (ref 0.0–0.7)
EOS PCT: 2 % (ref 0.0–5.0)
HCT: 32.3 % — ABNORMAL LOW (ref 39.0–52.0)
Hemoglobin: 10.8 g/dL — ABNORMAL LOW (ref 13.0–17.0)
LYMPHS ABS: 2.1 10*3/uL (ref 0.7–4.0)
Lymphocytes Relative: 27.5 % (ref 12.0–46.0)
MCHC: 33.3 g/dL (ref 30.0–36.0)
MCV: 89.5 fl (ref 78.0–100.0)
MONO ABS: 0.7 10*3/uL (ref 0.1–1.0)
Monocytes Relative: 9.5 % (ref 3.0–12.0)
NEUTROS ABS: 4.6 10*3/uL (ref 1.4–7.7)
Neutrophils Relative %: 60.5 % (ref 43.0–77.0)
PLATELETS: 223 10*3/uL (ref 150.0–400.0)
RBC: 3.61 Mil/uL — ABNORMAL LOW (ref 4.22–5.81)
RDW: 17.5 % — AB (ref 11.5–15.5)
WBC: 7.6 10*3/uL (ref 4.0–10.5)

## 2015-07-10 ENCOUNTER — Other Ambulatory Visit: Payer: Self-pay

## 2015-07-10 ENCOUNTER — Encounter: Payer: Self-pay | Admitting: Internal Medicine

## 2015-07-10 DIAGNOSIS — D500 Iron deficiency anemia secondary to blood loss (chronic): Secondary | ICD-10-CM

## 2015-07-10 DIAGNOSIS — D509 Iron deficiency anemia, unspecified: Secondary | ICD-10-CM

## 2015-07-16 DIAGNOSIS — G4733 Obstructive sleep apnea (adult) (pediatric): Secondary | ICD-10-CM | POA: Diagnosis not present

## 2015-07-21 DIAGNOSIS — M1711 Unilateral primary osteoarthritis, right knee: Secondary | ICD-10-CM | POA: Diagnosis not present

## 2015-08-03 ENCOUNTER — Telehealth: Payer: Self-pay

## 2015-08-03 NOTE — Telephone Encounter (Signed)
-----   Message from Algernon Huxley, RN sent at 07/10/2015  2:09 PM EST ----- Regarding: FW: cbc Pt needs labs in 4 weeks, orders in epic.  ----- Message -----    From: Algernon Huxley, RN    Sent: 07/09/2015      To: Algernon Huxley, RN Subject: cbc                                            CBC in 2 mth

## 2015-08-03 NOTE — Telephone Encounter (Signed)
Pt aware.

## 2015-08-04 ENCOUNTER — Encounter: Payer: Self-pay | Admitting: *Deleted

## 2015-08-05 ENCOUNTER — Other Ambulatory Visit (INDEPENDENT_AMBULATORY_CARE_PROVIDER_SITE_OTHER): Payer: PPO

## 2015-08-05 DIAGNOSIS — D509 Iron deficiency anemia, unspecified: Secondary | ICD-10-CM | POA: Diagnosis not present

## 2015-08-05 DIAGNOSIS — D500 Iron deficiency anemia secondary to blood loss (chronic): Secondary | ICD-10-CM

## 2015-08-05 LAB — CBC WITH DIFFERENTIAL/PLATELET
BASOS PCT: 0.6 % (ref 0.0–3.0)
Basophils Absolute: 0 10*3/uL (ref 0.0–0.1)
EOS PCT: 2.2 % (ref 0.0–5.0)
Eosinophils Absolute: 0.2 10*3/uL (ref 0.0–0.7)
HCT: 36.2 % — ABNORMAL LOW (ref 39.0–52.0)
HEMOGLOBIN: 12.1 g/dL — AB (ref 13.0–17.0)
LYMPHS ABS: 1.9 10*3/uL (ref 0.7–4.0)
Lymphocytes Relative: 25.9 % (ref 12.0–46.0)
MCHC: 33.4 g/dL (ref 30.0–36.0)
MCV: 90.2 fl (ref 78.0–100.0)
MONO ABS: 0.8 10*3/uL (ref 0.1–1.0)
MONOS PCT: 11.2 % (ref 3.0–12.0)
NEUTROS PCT: 60.1 % (ref 43.0–77.0)
Neutro Abs: 4.5 10*3/uL (ref 1.4–7.7)
Platelets: 218 10*3/uL (ref 150.0–400.0)
RBC: 4.01 Mil/uL — AB (ref 4.22–5.81)
RDW: 17.5 % — AB (ref 11.5–15.5)
WBC: 7.4 10*3/uL (ref 4.0–10.5)

## 2015-08-05 LAB — FERRITIN: FERRITIN: 21.7 ng/mL — AB (ref 22.0–322.0)

## 2015-08-05 LAB — IBC PANEL
IRON: 121 ug/dL (ref 42–165)
Saturation Ratios: 28.2 % (ref 20.0–50.0)
Transferrin: 307 mg/dL (ref 212.0–360.0)

## 2015-08-07 ENCOUNTER — Other Ambulatory Visit: Payer: Self-pay

## 2015-08-07 DIAGNOSIS — D509 Iron deficiency anemia, unspecified: Secondary | ICD-10-CM

## 2015-08-12 DIAGNOSIS — G4733 Obstructive sleep apnea (adult) (pediatric): Secondary | ICD-10-CM | POA: Diagnosis not present

## 2015-08-17 ENCOUNTER — Encounter: Payer: Self-pay | Admitting: Internal Medicine

## 2015-08-17 ENCOUNTER — Ambulatory Visit (INDEPENDENT_AMBULATORY_CARE_PROVIDER_SITE_OTHER): Payer: PPO | Admitting: Internal Medicine

## 2015-08-17 VITALS — BP 102/60 | HR 68 | Ht 66.75 in | Wt 218.8 lb

## 2015-08-17 DIAGNOSIS — K2970 Gastritis, unspecified, without bleeding: Secondary | ICD-10-CM

## 2015-08-17 DIAGNOSIS — D509 Iron deficiency anemia, unspecified: Secondary | ICD-10-CM

## 2015-08-17 DIAGNOSIS — I25119 Atherosclerotic heart disease of native coronary artery with unspecified angina pectoris: Secondary | ICD-10-CM

## 2015-08-17 DIAGNOSIS — K2990 Gastroduodenitis, unspecified, without bleeding: Secondary | ICD-10-CM

## 2015-08-17 DIAGNOSIS — Z7901 Long term (current) use of anticoagulants: Secondary | ICD-10-CM

## 2015-08-17 NOTE — Progress Notes (Signed)
Subjective:    Patient ID: Donald [name], male    DOB: 13-Feb-1933, 82 y.o.   MRN: [id number]  HPI Donald [name] is an 82 year old male with a history of iron deficiency anemia, heme positive stools with gastritis and history of gastric angiodysplasia here for follow-up. He also has a history of A. fib on chronic anticoagulation, CAD, hypertension.  He was last seen 2 months ago. He reports that he has been feeling better. He recently had blood work and his hemoglobin has improved over 12 g/dL. He is continue pantoprazole 40 mg twice daily. Energy levels have returned to near normal for him. He's having very rare anginal symptoms which are mild. He will occasionally use nitroglycerin. He denies dyspnea on exertion. No lower extremity edema. Bowel movements have been dark with oral iron and he has not seen frank melena or rectal bleeding. He denies abdominal pain. No nausea or vomiting. No early satiety.   Review of Systems As per history of present illness, otherwise negative  Current Medications, Allergies, Past Medical History, Past Surgical History, Family History and Social History were reviewed in [employer] record.     Objective:   Physical Exam BP 102/60 mmHg  Pulse 68  Ht 5' 6.75" (1.695 m)  Wt 218 lb 12.8 oz (99.247 kg)  BMI 34.54 kg/m2 Constitutional: Well-developed and well-nourished. No distress. HEENT: Normocephalic and atraumatic. Conjunctivae are normal.  No scleral icterus.   CBC    Component Value Date/Time   WBC 7.4 08/05/2015 1533   RBC 4.01* 08/05/2015 1533   HGB 12.1* 08/05/2015 1533   HCT 36.2* 08/05/2015 1533   PLT 218.0 08/05/2015 1533   MCV 90.2 08/05/2015 1533   MCH 29.2 06/13/2015 0556   MCHC 33.4 08/05/2015 1533   RDW 17.5* 08/05/2015 1533   LYMPHSABS 1.9 08/05/2015 1533   MONOABS 0.8 08/05/2015 1533   EOSABS 0.2 08/05/2015 1533   BASOSABS 0.0 08/05/2015 1533    CMP     Component Value Date/Time   NA 142 06/13/2015 0556   K 4.4 06/13/2015 0556   CL 110 06/13/2015 0556   CO2 23 06/13/2015 0556   GLUCOSE 96 06/13/2015 0556   BUN 13 06/13/2015 0556   CREATININE 1.02 06/13/2015 0556   CREATININE 1.04 06/10/2015 1038   CALCIUM 9.2 06/13/2015 0556   PROT 6.4* 06/10/2015 1945   ALBUMIN 3.7 06/10/2015 1945   AST 23 06/10/2015 1945   ALT 16* 06/10/2015 1945   ALKPHOS 64 06/10/2015 1945   BILITOT 0.3 06/10/2015 1945   GFRNONAA >60 06/13/2015 0556   GFRAA >60 06/13/2015 0556    Iron/TIBC/Ferritin/ %Sat    Component Value Date/Time   IRON 121 08/05/2015 1533   TIBC 386 04/23/2015 1200   FERRITIN 21.7* 08/05/2015 1533   IRONPCTSAT 28.2 08/05/2015 1533   CBC Latest Ref Rng 08/05/2015 07/09/2015 06/25/2015  WBC 4.0 - 10.5 K/uL 7.4 7.6 7.4  Hemoglobin 13.0 - 17.0 g/dL 12.1(L) 10.8(L) 10.6(L)  Hematocrit 39.0 - 52.0 % 36.2(L) 32.3(L) 32.9(L)  Platelets 150.0 - 400.0 K/uL 218.0 223.0 241.0        Assessment & Plan:  82 year old male with a history of iron deficiency anemia, heme positive stools with gastritis and history of gastric angiodysplasia here for follow-up.  1. IDA with erosive gastritis and chronic anticoagulation -- he has done well and is feeling better. Blood counts to come up very nicely and iron stores are also improved. Percent saturation of iron is now normal. Ferritin very  slightly low. TIBC is normal. I recommend we continue oral iron in the form of ferrous sulfate 325 mg once daily going forward. Repeat CBC and iron studies in 3 months. Decrease pantoprazole to 40 mg once daily at this point given improvement. No role for VCE at this time given improvement.  2. History of colon polyps -- exam to hepatic flexure with Dr. start due to colonic tortuosity. We have discussed repeat colonoscopy which he elected to defer for now. Discuss again at follow-up.  3. CAD/A. Fib -- on anticoagulation therapy and even with this blood counts have improved. This is reassuring. He asked about the possibility of  needing PCI in the future and if a choice exists, I would recommend bare metal stent over drug-eluting stent to lower the necessary antiplatelet time after intervention. He will discuss this with his cardiologist.  Six-month follow-up, sooner if necessary

## 2015-08-17 NOTE — Patient Instructions (Signed)
Decrease your pantoprazole to 1 capsule daily.  Please come for your labs in June as previously scheduled.  Please follow up with Dr Hilarie Fredrickson in 1 year.

## 2015-09-03 ENCOUNTER — Other Ambulatory Visit: Payer: Self-pay

## 2015-09-03 ENCOUNTER — Telehealth: Payer: Self-pay

## 2015-09-03 DIAGNOSIS — N281 Cyst of kidney, acquired: Secondary | ICD-10-CM

## 2015-09-03 NOTE — Telephone Encounter (Signed)
-----   Message from Algernon Huxley, RN sent at 08/26/2014  1:39 PM EDT ----- Regarding: MRI Abdomen Pt needs repeat MRI of abdomen for complex cyst on Left kidney

## 2015-09-03 NOTE — Telephone Encounter (Signed)
Pt to come for labs tomorrow. Pt scheduled for MR of abdomen at [facility] 09/10/15@9am , pt to arrive there at 8:45am. Pt aware of appt.

## 2015-09-04 ENCOUNTER — Other Ambulatory Visit (INDEPENDENT_AMBULATORY_CARE_PROVIDER_SITE_OTHER): Payer: PPO

## 2015-09-04 DIAGNOSIS — N281 Cyst of kidney, acquired: Secondary | ICD-10-CM | POA: Diagnosis not present

## 2015-09-04 LAB — BASIC METABOLIC PANEL
BUN: 23 mg/dL (ref 6–23)
CALCIUM: 9.8 mg/dL (ref 8.4–10.5)
CO2: 29 mEq/L (ref 19–32)
CREATININE: 1.1 mg/dL (ref 0.40–1.50)
Chloride: 105 mEq/L (ref 96–112)
GFR: 68.35 mL/min (ref >60.00)
GLUCOSE: 98 mg/dL (ref 70–99)
Potassium: 4.1 mEq/L (ref 3.5–5.1)
Sodium: 141 mEq/L (ref 135–145)

## 2015-09-04 LAB — CBC WITH DIFFERENTIAL/PLATELET
BASOS ABS: 0 10*3/uL (ref 0.0–0.1)
Basophils Relative: 0.2 % (ref 0.0–3.0)
EOS ABS: 0.2 10*3/uL (ref 0.0–0.7)
Eosinophils Relative: 2.5 % (ref 0.0–5.0)
HCT: 34.7 % — ABNORMAL LOW (ref 39.0–52.0)
HEMOGLOBIN: 11.6 g/dL — AB (ref 13.0–17.0)
LYMPHS ABS: 1.9 10*3/uL (ref 0.7–4.0)
Lymphocytes Relative: 21.3 % (ref 12.0–46.0)
MCHC: 33.5 g/dL (ref 30.0–36.0)
MCV: 91.4 fl (ref 78.0–100.0)
MONO ABS: 0.8 10*3/uL (ref 0.1–1.0)
Monocytes Relative: 8.7 % (ref 3.0–12.0)
NEUTROS PCT: 67.3 % (ref 43.0–77.0)
Neutro Abs: 5.9 10*3/uL (ref 1.4–7.7)
Platelets: 198 10*3/uL (ref 150.0–400.0)
RBC: 3.8 Mil/uL — AB (ref 4.22–5.81)
RDW: 16.3 % — ABNORMAL HIGH (ref 11.5–15.5)
WBC: 8.8 10*3/uL (ref 4.0–10.5)

## 2015-09-07 ENCOUNTER — Other Ambulatory Visit: Payer: Self-pay

## 2015-09-07 DIAGNOSIS — D649 Anemia, unspecified: Secondary | ICD-10-CM

## 2015-09-09 DIAGNOSIS — G4733 Obstructive sleep apnea (adult) (pediatric): Secondary | ICD-10-CM | POA: Diagnosis not present

## 2015-09-10 ENCOUNTER — Ambulatory Visit (HOSPITAL_COMMUNITY)
Admission: RE | Admit: 2015-09-10 | Discharge: 2015-09-10 | Disposition: A | Discharge status: Home / Self Care | Payer: PPO | Source: Ambulatory Visit | Attending: Internal Medicine | Admitting: Internal Medicine

## 2015-09-10 DIAGNOSIS — N281 Cyst of kidney, acquired: Secondary | ICD-10-CM | POA: Insufficient documentation

## 2015-09-10 DIAGNOSIS — K7689 Other specified diseases of liver: Secondary | ICD-10-CM | POA: Insufficient documentation

## 2015-09-10 DIAGNOSIS — I2510 Atherosclerotic heart disease of native coronary artery without angina pectoris: Secondary | ICD-10-CM | POA: Diagnosis not present

## 2015-09-10 MED ORDER — GADOBENATE DIMEGLUMINE 529 MG/ML IV SOLN
20.0000 mL | Freq: Once | INTRAVENOUS | Status: AC | PRN
  Administered 2015-09-10: 20 mL via INTRAVENOUS

## 2015-09-13 ENCOUNTER — Other Ambulatory Visit: Payer: Self-pay | Admitting: Internal Medicine

## 2015-09-16 ENCOUNTER — Other Ambulatory Visit: Payer: Self-pay | Admitting: *Deleted

## 2015-09-16 ENCOUNTER — Encounter: Payer: Self-pay | Admitting: Internal Medicine

## 2015-09-16 MED ORDER — PANTOPRAZOLE SODIUM 40 MG PO TBEC
40.0000 mg | DELAYED_RELEASE_TABLET | Freq: Every day | ORAL | Status: DC
Start: 2015-09-16 — End: 2015-12-16

## 2015-10-06 DIAGNOSIS — H26492 Other secondary cataract, left eye: Secondary | ICD-10-CM | POA: Diagnosis not present

## 2015-10-06 DIAGNOSIS — H35031 Hypertensive retinopathy, right eye: Secondary | ICD-10-CM | POA: Diagnosis not present

## 2015-10-06 DIAGNOSIS — H353122 Nonexudative age-related macular degeneration, left eye, intermediate dry stage: Secondary | ICD-10-CM | POA: Diagnosis not present

## 2015-10-06 DIAGNOSIS — H35032 Hypertensive retinopathy, left eye: Secondary | ICD-10-CM | POA: Diagnosis not present

## 2015-10-06 DIAGNOSIS — H26491 Other secondary cataract, right eye: Secondary | ICD-10-CM | POA: Diagnosis not present

## 2015-10-06 DIAGNOSIS — H353112 Nonexudative age-related macular degeneration, right eye, intermediate dry stage: Secondary | ICD-10-CM | POA: Diagnosis not present

## 2015-10-13 DIAGNOSIS — G4733 Obstructive sleep apnea (adult) (pediatric): Secondary | ICD-10-CM | POA: Diagnosis not present

## 2015-10-16 DIAGNOSIS — H26492 Other secondary cataract, left eye: Secondary | ICD-10-CM | POA: Diagnosis not present

## 2015-10-27 ENCOUNTER — Telehealth: Payer: Self-pay

## 2015-10-27 ENCOUNTER — Other Ambulatory Visit (INDEPENDENT_AMBULATORY_CARE_PROVIDER_SITE_OTHER): Payer: PPO

## 2015-10-27 DIAGNOSIS — D509 Iron deficiency anemia, unspecified: Secondary | ICD-10-CM

## 2015-10-27 DIAGNOSIS — D649 Anemia, unspecified: Secondary | ICD-10-CM | POA: Diagnosis not present

## 2015-10-27 LAB — CBC WITH DIFFERENTIAL/PLATELET
BASOS ABS: 0 10*3/uL (ref 0.0–0.1)
Basophils Relative: 0.5 % (ref 0.0–3.0)
EOS PCT: 1.4 % (ref 0.0–5.0)
Eosinophils Absolute: 0.1 10*3/uL (ref 0.0–0.7)
HCT: 36.5 % — ABNORMAL LOW (ref 39.0–52.0)
HEMOGLOBIN: 12.3 g/dL — AB (ref 13.0–17.0)
LYMPHS PCT: 27.7 % (ref 12.0–46.0)
Lymphs Abs: 2.2 10*3/uL (ref 0.7–4.0)
MCHC: 33.7 g/dL (ref 30.0–36.0)
MCV: 93.3 fl (ref 78.0–100.0)
MONOS PCT: 9.2 % (ref 3.0–12.0)
Monocytes Absolute: 0.7 10*3/uL (ref 0.1–1.0)
Neutro Abs: 4.9 10*3/uL (ref 1.4–7.7)
Neutrophils Relative %: 61.2 % (ref 43.0–77.0)
Platelets: 223 10*3/uL (ref 150.0–400.0)
RBC: 3.92 Mil/uL — AB (ref 4.22–5.81)
RDW: 15 % (ref 11.5–15.5)
WBC: 8 10*3/uL (ref 4.0–10.5)

## 2015-10-27 LAB — BASIC METABOLIC PANEL
BUN: 24 mg/dL — ABNORMAL HIGH (ref 6–23)
CALCIUM: 9.3 mg/dL (ref 8.4–10.5)
CHLORIDE: 105 meq/L (ref 96–112)
CO2: 27 meq/L (ref 19–32)
CREATININE: 1.19 mg/dL (ref 0.40–1.50)
GFR: 62.39 mL/min (ref >60.00)
GLUCOSE: 76 mg/dL (ref 70–99)
Potassium: 4.1 mEq/L (ref 3.5–5.1)
Sodium: 140 mEq/L (ref 135–145)

## 2015-10-27 LAB — IBC PANEL
Iron: 89 ug/dL (ref 42–165)
SATURATION RATIOS: 22.5 % (ref 20.0–50.0)
Transferrin: 282 mg/dL (ref 212.0–360.0)

## 2015-10-27 LAB — FERRITIN: FERRITIN: 21 ng/mL — AB (ref 22.0–322.0)

## 2015-10-27 NOTE — Telephone Encounter (Signed)
-----   Message from Algernon Huxley, RN sent at 08/07/2015 10:21 AM EDT ----- Regarding: Labs Pt needs labs in 3 mths, orders in epic.

## 2015-10-27 NOTE — Telephone Encounter (Signed)
Pt aware.

## 2015-10-29 ENCOUNTER — Other Ambulatory Visit: Payer: Self-pay

## 2015-10-29 DIAGNOSIS — D509 Iron deficiency anemia, unspecified: Secondary | ICD-10-CM

## 2015-11-03 ENCOUNTER — Other Ambulatory Visit: Payer: Self-pay | Admitting: Physician Assistant

## 2015-11-10 DIAGNOSIS — G4733 Obstructive sleep apnea (adult) (pediatric): Secondary | ICD-10-CM | POA: Diagnosis not present

## 2015-11-30 DIAGNOSIS — L918 Other hypertrophic disorders of the skin: Secondary | ICD-10-CM | POA: Diagnosis not present

## 2015-11-30 DIAGNOSIS — D2261 Melanocytic nevi of right upper limb, including shoulder: Secondary | ICD-10-CM | POA: Diagnosis not present

## 2015-11-30 DIAGNOSIS — L720 Epidermal cyst: Secondary | ICD-10-CM | POA: Diagnosis not present

## 2015-11-30 DIAGNOSIS — Z85828 Personal history of other malignant neoplasm of skin: Secondary | ICD-10-CM | POA: Diagnosis not present

## 2015-11-30 DIAGNOSIS — D225 Melanocytic nevi of trunk: Secondary | ICD-10-CM | POA: Diagnosis not present

## 2015-11-30 DIAGNOSIS — L570 Actinic keratosis: Secondary | ICD-10-CM | POA: Diagnosis not present

## 2015-11-30 DIAGNOSIS — D692 Other nonthrombocytopenic purpura: Secondary | ICD-10-CM | POA: Diagnosis not present

## 2015-11-30 DIAGNOSIS — C44319 Basal cell carcinoma of skin of other parts of face: Secondary | ICD-10-CM | POA: Diagnosis not present

## 2015-11-30 DIAGNOSIS — D224 Melanocytic nevi of scalp and neck: Secondary | ICD-10-CM | POA: Diagnosis not present

## 2015-11-30 DIAGNOSIS — L821 Other seborrheic keratosis: Secondary | ICD-10-CM | POA: Diagnosis not present

## 2015-12-09 ENCOUNTER — Other Ambulatory Visit (INDEPENDENT_AMBULATORY_CARE_PROVIDER_SITE_OTHER): Payer: PPO

## 2015-12-09 DIAGNOSIS — D509 Iron deficiency anemia, unspecified: Secondary | ICD-10-CM | POA: Diagnosis not present

## 2015-12-09 LAB — CBC WITH DIFFERENTIAL/PLATELET
BASOS PCT: 0.4 % (ref 0.0–3.0)
Basophils Absolute: 0 10*3/uL (ref 0.0–0.1)
EOS PCT: 2.6 % (ref 0.0–5.0)
Eosinophils Absolute: 0.2 10*3/uL (ref 0.0–0.7)
HCT: 38 % — ABNORMAL LOW (ref 39.0–52.0)
Hemoglobin: 12.8 g/dL — ABNORMAL LOW (ref 13.0–17.0)
LYMPHS ABS: 2 10*3/uL (ref 0.7–4.0)
Lymphocytes Relative: 26.1 % (ref 12.0–46.0)
MCHC: 33.8 g/dL (ref 30.0–36.0)
MCV: 95.1 fl (ref 78.0–100.0)
MONO ABS: 0.7 10*3/uL (ref 0.1–1.0)
Monocytes Relative: 10 % (ref 3.0–12.0)
NEUTROS PCT: 60.9 % (ref 43.0–77.0)
Neutro Abs: 4.6 10*3/uL (ref 1.4–7.7)
Platelets: 189 10*3/uL (ref 150.0–400.0)
RBC: 3.99 Mil/uL — ABNORMAL LOW (ref 4.22–5.81)
RDW: 14.5 % (ref 11.5–15.5)
WBC: 7.5 10*3/uL (ref 4.0–10.5)

## 2015-12-09 LAB — IBC PANEL
Iron: 54 ug/dL (ref 42–165)
Saturation Ratios: 14.2 % — ABNORMAL LOW (ref 20.0–50.0)
Transferrin: 271 mg/dL (ref 212.0–360.0)

## 2015-12-09 LAB — FERRITIN: FERRITIN: 26.2 ng/mL (ref 22.0–322.0)

## 2015-12-10 DIAGNOSIS — G4733 Obstructive sleep apnea (adult) (pediatric): Secondary | ICD-10-CM | POA: Diagnosis not present

## 2015-12-16 ENCOUNTER — Other Ambulatory Visit: Payer: Self-pay | Admitting: Internal Medicine

## 2015-12-18 ENCOUNTER — Other Ambulatory Visit: Payer: Self-pay

## 2015-12-18 DIAGNOSIS — D509 Iron deficiency anemia, unspecified: Secondary | ICD-10-CM

## 2015-12-21 DIAGNOSIS — C44319 Basal cell carcinoma of skin of other parts of face: Secondary | ICD-10-CM | POA: Diagnosis not present

## 2015-12-21 DIAGNOSIS — Z85828 Personal history of other malignant neoplasm of skin: Secondary | ICD-10-CM | POA: Diagnosis not present

## 2015-12-24 ENCOUNTER — Other Ambulatory Visit: Payer: Self-pay | Admitting: Internal Medicine

## 2016-01-11 DIAGNOSIS — M7672 Peroneal tendinitis, left leg: Secondary | ICD-10-CM | POA: Diagnosis not present

## 2016-01-11 DIAGNOSIS — M7732 Calcaneal spur, left foot: Secondary | ICD-10-CM | POA: Diagnosis not present

## 2016-01-11 DIAGNOSIS — M79672 Pain in left foot: Secondary | ICD-10-CM | POA: Diagnosis not present

## 2016-01-11 DIAGNOSIS — M722 Plantar fascial fibromatosis: Secondary | ICD-10-CM | POA: Diagnosis not present

## 2016-01-12 DIAGNOSIS — G4733 Obstructive sleep apnea (adult) (pediatric): Secondary | ICD-10-CM | POA: Diagnosis not present

## 2016-01-13 ENCOUNTER — Other Ambulatory Visit: Payer: Self-pay | Admitting: Internal Medicine

## 2016-02-03 ENCOUNTER — Encounter: Payer: Self-pay | Admitting: Internal Medicine

## 2016-02-03 ENCOUNTER — Other Ambulatory Visit: Payer: Self-pay | Admitting: Internal Medicine

## 2016-02-11 DIAGNOSIS — G4733 Obstructive sleep apnea (adult) (pediatric): Secondary | ICD-10-CM | POA: Diagnosis not present

## 2016-02-14 ENCOUNTER — Encounter (HOSPITAL_COMMUNITY): Payer: Self-pay

## 2016-02-14 ENCOUNTER — Inpatient Hospital Stay (HOSPITAL_COMMUNITY)
Admission: EM | Admit: 2016-02-14 | Discharge: 2016-02-16 | DRG: 243 | Disposition: A | Discharge status: Home / Self Care | Payer: PPO | Attending: Internal Medicine | Admitting: Internal Medicine

## 2016-02-14 ENCOUNTER — Emergency Department (HOSPITAL_COMMUNITY): Payer: PPO

## 2016-02-14 DIAGNOSIS — Z809 Family history of malignant neoplasm, unspecified: Secondary | ICD-10-CM | POA: Diagnosis not present

## 2016-02-14 DIAGNOSIS — Z85828 Personal history of other malignant neoplasm of skin: Secondary | ICD-10-CM | POA: Diagnosis not present

## 2016-02-14 DIAGNOSIS — K219 Gastro-esophageal reflux disease without esophagitis: Secondary | ICD-10-CM | POA: Diagnosis present

## 2016-02-14 DIAGNOSIS — F17220 Nicotine dependence, chewing tobacco, uncomplicated: Secondary | ICD-10-CM | POA: Diagnosis not present

## 2016-02-14 DIAGNOSIS — I4892 Unspecified atrial flutter: Secondary | ICD-10-CM | POA: Diagnosis present

## 2016-02-14 DIAGNOSIS — E785 Hyperlipidemia, unspecified: Secondary | ICD-10-CM | POA: Diagnosis present

## 2016-02-14 DIAGNOSIS — I4891 Unspecified atrial fibrillation: Secondary | ICD-10-CM | POA: Diagnosis present

## 2016-02-14 DIAGNOSIS — Z79899 Other long term (current) drug therapy: Secondary | ICD-10-CM

## 2016-02-14 DIAGNOSIS — I110 Hypertensive heart disease with heart failure: Secondary | ICD-10-CM | POA: Diagnosis not present

## 2016-02-14 DIAGNOSIS — Z8719 Personal history of other diseases of the digestive system: Secondary | ICD-10-CM

## 2016-02-14 DIAGNOSIS — G4733 Obstructive sleep apnea (adult) (pediatric): Secondary | ICD-10-CM | POA: Diagnosis present

## 2016-02-14 DIAGNOSIS — J449 Chronic obstructive pulmonary disease, unspecified: Secondary | ICD-10-CM | POA: Diagnosis not present

## 2016-02-14 DIAGNOSIS — R7989 Other specified abnormal findings of blood chemistry: Secondary | ICD-10-CM

## 2016-02-14 DIAGNOSIS — I5032 Chronic diastolic (congestive) heart failure: Secondary | ICD-10-CM | POA: Diagnosis present

## 2016-02-14 DIAGNOSIS — Q231 Congenital insufficiency of aortic valve: Secondary | ICD-10-CM | POA: Diagnosis not present

## 2016-02-14 DIAGNOSIS — I2510 Atherosclerotic heart disease of native coronary artery without angina pectoris: Secondary | ICD-10-CM | POA: Diagnosis present

## 2016-02-14 DIAGNOSIS — I495 Sick sinus syndrome: Secondary | ICD-10-CM | POA: Diagnosis present

## 2016-02-14 DIAGNOSIS — Z9889 Other specified postprocedural states: Secondary | ICD-10-CM | POA: Diagnosis not present

## 2016-02-14 DIAGNOSIS — R001 Bradycardia, unspecified: Secondary | ICD-10-CM | POA: Diagnosis present

## 2016-02-14 DIAGNOSIS — R778 Other specified abnormalities of plasma proteins: Secondary | ICD-10-CM | POA: Diagnosis present

## 2016-02-14 DIAGNOSIS — Z7951 Long term (current) use of inhaled steroids: Secondary | ICD-10-CM | POA: Diagnosis not present

## 2016-02-14 DIAGNOSIS — H9190 Unspecified hearing loss, unspecified ear: Secondary | ICD-10-CM | POA: Diagnosis present

## 2016-02-14 DIAGNOSIS — Z950 Presence of cardiac pacemaker: Secondary | ICD-10-CM

## 2016-02-14 DIAGNOSIS — Z7901 Long term (current) use of anticoagulants: Secondary | ICD-10-CM | POA: Diagnosis not present

## 2016-02-14 DIAGNOSIS — I360 Nonrheumatic tricuspid (valve) stenosis: Secondary | ICD-10-CM | POA: Diagnosis not present

## 2016-02-14 DIAGNOSIS — R748 Abnormal levels of other serum enzymes: Secondary | ICD-10-CM | POA: Diagnosis present

## 2016-02-14 DIAGNOSIS — I480 Paroxysmal atrial fibrillation: Principal | ICD-10-CM | POA: Diagnosis present

## 2016-02-14 DIAGNOSIS — R079 Chest pain, unspecified: Secondary | ICD-10-CM | POA: Diagnosis not present

## 2016-02-14 DIAGNOSIS — F1010 Alcohol abuse, uncomplicated: Secondary | ICD-10-CM | POA: Diagnosis not present

## 2016-02-14 DIAGNOSIS — I429 Cardiomyopathy, unspecified: Secondary | ICD-10-CM | POA: Diagnosis present

## 2016-02-14 DIAGNOSIS — I10 Essential (primary) hypertension: Secondary | ICD-10-CM | POA: Diagnosis not present

## 2016-02-14 DIAGNOSIS — F1090 Alcohol use, unspecified, uncomplicated: Secondary | ICD-10-CM | POA: Diagnosis present

## 2016-02-14 DIAGNOSIS — Z7289 Other problems related to lifestyle: Secondary | ICD-10-CM | POA: Diagnosis present

## 2016-02-14 LAB — BASIC METABOLIC PANEL
Anion gap: 9 (ref 5–15)
BUN: 22 mg/dL — AB (ref 6–20)
CHLORIDE: 107 mmol/L (ref 101–111)
CO2: 24 mmol/L (ref 22–32)
Calcium: 9.3 mg/dL (ref 8.9–10.3)
Creatinine, Ser: 1.03 mg/dL (ref 0.61–1.24)
GFR calc Af Amer: >60 mL/min (ref >60)
GLUCOSE: 106 mg/dL — AB (ref 65–99)
POTASSIUM: 4.3 mmol/L (ref 3.5–5.1)
Sodium: 140 mmol/L (ref 135–145)

## 2016-02-14 LAB — I-STAT TROPONIN, ED: Troponin i, poc: 0 ng/mL (ref 0.00–0.08)

## 2016-02-14 LAB — BRAIN NATRIURETIC PEPTIDE: B NATRIURETIC PEPTIDE 5: 128.1 pg/mL — AB (ref 0.0–100.0)

## 2016-02-14 LAB — CBC
HEMATOCRIT: 40.6 % (ref 39.0–52.0)
Hemoglobin: 13.7 g/dL (ref 13.0–17.0)
MCH: 32.6 pg (ref 26.0–34.0)
MCHC: 33.7 g/dL (ref 30.0–36.0)
MCV: 96.7 fL (ref 78.0–100.0)
Platelets: 190 10*3/uL (ref 150–400)
RBC: 4.2 MIL/uL — ABNORMAL LOW (ref 4.22–5.81)
RDW: 13.9 % (ref 11.5–15.5)
WBC: 6.9 10*3/uL (ref 4.0–10.5)

## 2016-02-14 LAB — TROPONIN I
TROPONIN I: 0.08 ng/mL — AB (ref <0.03)
Troponin I: 0.06 ng/mL (ref <0.03)
Troponin I: 0.08 ng/mL (ref <0.03)

## 2016-02-14 LAB — PHOSPHORUS: Phosphorus: 2.7 mg/dL (ref 2.5–4.6)

## 2016-02-14 LAB — MAGNESIUM: MAGNESIUM: 2 mg/dL (ref 1.7–2.4)

## 2016-02-14 LAB — MRSA PCR SCREENING: MRSA by PCR: NEGATIVE

## 2016-02-14 LAB — TSH: TSH: 0.532 u[IU]/mL (ref 0.350–4.500)

## 2016-02-14 MED ORDER — ACETAMINOPHEN 325 MG PO TABS: 650.0000 mg | ORAL_TABLET | Freq: Four times a day (QID) | ORAL | Status: DC | PRN

## 2016-02-14 MED ORDER — DILTIAZEM HCL ER 240 MG PO CP24
240.0000 mg | ORAL_CAPSULE | Freq: Every day | ORAL | Status: DC | PRN
  Filled 2016-02-14: qty 1

## 2016-02-14 MED ORDER — ACETAMINOPHEN 650 MG RE SUPP: 650.0000 mg | Freq: Four times a day (QID) | RECTAL | Status: DC | PRN

## 2016-02-14 MED ORDER — PANTOPRAZOLE SODIUM 40 MG PO TBEC
40.0000 mg | DELAYED_RELEASE_TABLET | Freq: Every day | ORAL | Status: DC
  Administered 2016-02-14 – 2016-02-16 (×3): 40 mg via ORAL
  Filled 2016-02-14 (×3): qty 1

## 2016-02-14 MED ORDER — SODIUM CHLORIDE 0.9% FLUSH
3.0000 mL | Freq: Two times a day (BID) | INTRAVENOUS | Status: DC
  Administered 2016-02-14 – 2016-02-16 (×4): 3 mL via INTRAVENOUS

## 2016-02-14 MED ORDER — NITROGLYCERIN 0.4 MG SL SUBL: 0.4000 mg | SUBLINGUAL_TABLET | SUBLINGUAL | Status: DC | PRN

## 2016-02-14 MED ORDER — DILTIAZEM HCL-DEXTROSE 100-5 MG/100ML-% IV SOLN (PREMIX)
5.0000 mg/h | INTRAVENOUS | Status: DC
  Administered 2016-02-14: 2.5 mg/h via INTRAVENOUS
  Administered 2016-02-14: 5 mg/h via INTRAVENOUS
  Filled 2016-02-14: qty 100

## 2016-02-14 MED ORDER — APIXABAN 5 MG PO TABS
5.0000 mg | ORAL_TABLET | Freq: Once | ORAL | Status: AC
  Administered 2016-02-14: 5 mg via ORAL

## 2016-02-14 MED ORDER — MAGNESIUM OXIDE 400 (241.3 MG) MG PO TABS
200.0000 mg | ORAL_TABLET | Freq: Two times a day (BID) | ORAL | Status: DC
  Administered 2016-02-14 – 2016-02-16 (×5): 200 mg via ORAL
  Filled 2016-02-14 (×5): qty 1

## 2016-02-14 MED ORDER — ISOSORBIDE MONONITRATE ER 30 MG PO TB24
30.0000 mg | ORAL_TABLET | Freq: Every day | ORAL | Status: DC
Start: 2016-02-14 — End: 2016-02-16
  Administered 2016-02-14 – 2016-02-16 (×3): 30 mg via ORAL
  Filled 2016-02-14 (×3): qty 1

## 2016-02-14 MED ORDER — APIXABAN 5 MG PO TABS
5.0000 mg | ORAL_TABLET | Freq: Two times a day (BID) | ORAL | Status: DC
  Filled 2016-02-14: qty 1

## 2016-02-14 MED ORDER — DILTIAZEM HCL ER 240 MG PO CP24
240.0000 mg | ORAL_CAPSULE | Freq: Every day | ORAL | Status: DC
  Filled 2016-02-14: qty 1
  Filled 2016-02-14: qty 2
  Filled 2016-02-14: qty 1

## 2016-02-14 MED ORDER — DILTIAZEM LOAD VIA INFUSION
10.0000 mg | Freq: Once | INTRAVENOUS | Status: AC
  Administered 2016-02-14: 10 mg via INTRAVENOUS
  Filled 2016-02-14: qty 10

## 2016-02-14 MED ORDER — FERROUS SULFATE 325 (65 FE) MG PO TABS
325.0000 mg | ORAL_TABLET | Freq: Every day | ORAL | Status: DC
  Administered 2016-02-14 – 2016-02-16 (×3): 325 mg via ORAL
  Filled 2016-02-14 (×3): qty 1

## 2016-02-14 MED ORDER — DIGOXIN 0.25 MG/ML IJ SOLN
0.2500 mg | Freq: Once | INTRAMUSCULAR | Status: AC
  Administered 2016-02-14: 0.25 mg via INTRAVENOUS
  Filled 2016-02-14: qty 1

## 2016-02-14 MED ORDER — METOPROLOL TARTRATE 5 MG/5ML IV SOLN
5.0000 mg | Freq: Once | INTRAVENOUS | Status: DC
  Filled 2016-02-14 (×2): qty 5

## 2016-02-14 MED ORDER — FUROSEMIDE 40 MG PO TABS
40.0000 mg | ORAL_TABLET | Freq: Every day | ORAL | Status: DC
  Administered 2016-02-15 – 2016-02-16 (×2): 40 mg via ORAL
  Filled 2016-02-14 (×3): qty 1

## 2016-02-14 MED ORDER — DILTIAZEM HCL-DEXTROSE 100-5 MG/100ML-% IV SOLN (PREMIX)
INTRAVENOUS | Status: AC
  Filled 2016-02-14: qty 100

## 2016-02-14 MED ORDER — LOSARTAN POTASSIUM 50 MG PO TABS
100.0000 mg | ORAL_TABLET | Freq: Every evening | ORAL | Status: DC
  Administered 2016-02-14 – 2016-02-15 (×2): 100 mg via ORAL
  Filled 2016-02-14 (×2): qty 2

## 2016-02-14 MED ORDER — ATORVASTATIN CALCIUM 40 MG PO TABS
40.0000 mg | ORAL_TABLET | Freq: Every day | ORAL | Status: DC
  Administered 2016-02-14 – 2016-02-16 (×3): 40 mg via ORAL
  Filled 2016-02-14 (×2): qty 1

## 2016-02-14 NOTE — Progress Notes (Signed)
CRITICAL VALUE ALERT  Critical value received: Troponin 0.08  Date of notification:  02-14-16  Time of notification:  1503  Critical value read back:Yes  Nurse who received alert:  Yehuda Budd  MD notified (1st page):  Wynetta Emery MD  Time of first page:  1505

## 2016-02-14 NOTE — H&P (Addendum)
History and Physical  Donald [name] [id number] DOB: [birth year] DOA: 02/14/2016  Referring physician: Margretta Sidle PCP: [name], MD  Outpatient Specialists:  1. [facility] cardiologist [facility] 2. Billie Lade - GI  Chief Complaint: chest pain  HPI: Donald [name] is a 82 y.o. male with a history of atrial fibrillation/atrial flutter s/p ablation, cardiomyopathy, CAD, and other medical comorbidities detailed below who presents to the Emergency Department complaining of chest pain and an irregular HR beginning tonight.  He reports SOB stating he "feels like he's been running" and adds that he measured his heart rate at home and it was fluctuating between 40 and 180.  Pt reports taking nitroglycerin with some relief.  No additional injury or complaint.    ED course: He was noted to have atrial fibrillation with RVR and started on IV diltiazem and admitted to stepdown unit for care.    Review of Systems: All systems reviewed and apart from history of presenting illness, are negative.  Past Medical History:  Diagnosis Date  . Abnormal CT scan, stomach 02/2014   Thickening of gastric fundus and cardia.  gastritis on EGD 03/2014  . Allergic rhinitis due to pollen   . Arthropathy, unspecified, site unspecified   . Atherosclerosis    a. Noted by abdominal CT 02/2014 (h/o normal nuc 2011).  . Atrial flutter ([place]) 02/24/14   s/p ablation 11/15  . Cardiomyopathy ([place])    tachy mediated - a. TEE (10/15):  EF 30%;  b. Echo after NSR restored (10/15):  mild LVH, EF 55-60%, mild AS, mild AI, mild MR, mild to mod LAE, mild RAE  . Diverticulosis    severe in descending, sigmoid colon.   . ED (erectile dysfunction)   . Esophageal stricture 03/2014   traversable with endoscope. not dilated.   . Esophageal stricture   . Gastric AVM   . GERD (gastroesophageal reflux disease) 03/2014   small HH and gastritis on EGD  . GI bleed   . Habitual alcohol use   . Hemorrhoids   . Hiatal hernia   .  Hyperlipidemia   . Hypertension   . Obesity   . OSA on CPAP   . PAF (paroxysmal atrial fibrillation) ([place])   . Prostatitis, unspecified   . Sinus bradycardia   . Skin cancer    basal and squamous cell   Past Surgical History:  Procedure Laterality Date  . ABLATION  03/19/2014   RFCA of atrial flutter by Dr Caryl Comes  . ATRIAL FLUTTER ABLATION N/A 03/19/2014   Procedure: ATRIAL FLUTTER ABLATION;  Surgeon: Deboraha Sprang, MD;  Location: [facility] CATH LAB;  Service: Cardiovascular;  Laterality: N/A;  . CARDIOVERSION N/A 02/24/2014   Procedure: CARDIOVERSION;  Surgeon: Lelon Perla, MD;  Location: [place];  Service: Cardiovascular;  Laterality: N/A;  . CARDIOVERSION Right 09/10/2014   Procedure: CARDIOVERSION;  Surgeon: Deboraha Sprang, MD;  Location: [facility] CATH LAB;  Service: Cardiovascular;  Laterality: Right;  . COLONOSCOPY N/A 04/23/2015   Procedure: COLONOSCOPY;  Surgeon: Ladene Artist, MD;  Location: [facility] ENDOSCOPY;  Service: Endoscopy;  Laterality: N/A;  . ENTEROSCOPY N/A 06/12/2015   Procedure: ENTEROSCOPY;  Surgeon: Ladene Artist, MD;  Location: [facility] ENDOSCOPY;  Service: Endoscopy;  Laterality: N/A;  . ESOPHAGOGASTRODUODENOSCOPY N/A 04/09/2014   Procedure: ESOPHAGOGASTRODUODENOSCOPY (EGD);  Surgeon: Inda Castle, MD;  Location: [place];  Service: Endoscopy;  Laterality: N/A;  . ESOPHAGOGASTRODUODENOSCOPY N/A 04/23/2015   Procedure: ESOPHAGOGASTRODUODENOSCOPY (EGD);  Surgeon: Ladene Artist, MD;  Location: [facility] ENDOSCOPY;  Service: Endoscopy;  Laterality: N/A;  . HERNIA REPAIR    . INGUINAL HERNIA REPAIR     right  . INGUINAL HERNIA REPAIR     left  . ORIF fracture of the elbow    . SKIN CANCER EXCISION  05/21/12   Squamous cell ca  . SQUAMOUS CELL CARCINOMA EXCISION    . TEE WITHOUT CARDIOVERSION N/A 02/24/2014   Procedure: TRANSESOPHAGEAL ECHOCARDIOGRAM (TEE);  Surgeon: Lelon Perla, MD;  Location: [facility] ENDOSCOPY;  Service: Cardiovascular;  Laterality: N/A;  .  TONSILLECTOMY     Social History:  reports that he quit smoking about 7 years ago. His smoking use included Cigarettes and Cigars. He smoked 0.00 packs per day for 40.00 years. His smokeless tobacco use includes Chew. He reports that he drinks about 12.6 oz of alcohol per week . He reports that he does not use drugs.  No Known Allergies  Family History  Problem Relation Age of Onset  . Cancer Mother     ovarian  . Other Father     renal disease- grief over loss of spouse  . Cancer Brother     prostate- died of hematologic disorder 2nd to chemo  . Pulmonary fibrosis Brother   . Diabetes Neg Hx   . Coronary artery disease Neg Hx   . Colon cancer Neg Hx   . Stomach cancer Neg Hx   . Rectal cancer Neg Hx    Prior to Admission medications   Medication Sig Start Date End Date Taking? Authorizing Provider  apixaban (ELIQUIS) 5 MG TABS tablet Take 1 tablet (5 mg total) by mouth 2 (two) times daily. 07/06/15  Yes Deboraha Sprang, MD  atorvastatin (LIPITOR) 40 MG tablet Take 1 tablet (40 mg total) by mouth daily. Yearly physical w/labs are due must see MD for future refills 02/03/16  Yes Janith Lima, MD  diltiazem (DILACOR XR) 240 MG 24 hr capsule Take 240 mg by mouth daily as needed (increased heart rate).   Yes Historical Provider, MD  ferrous sulfate 325 (65 FE) MG tablet Take 1 tablet (325 mg total) by mouth daily with breakfast. 04/25/15  Yes Reyne Dumas, MD  fluticasone (FLONASE) 50 MCG/ACT nasal spray USE 1 SPRAY EACH NOSTRIL ONCE DAILY.    SHAKE GENTLY  Patient taking differently: 1 spray in each nostril daily as needed for stuffiness 12/24/15  Yes Janith Lima, MD  furosemide (LASIX) 40 MG tablet TAKE 1 TABLET EACH DAY. 01/14/16  Yes Janith Lima, MD  isosorbide mononitrate (IMDUR) 30 MG 24 hr tablet Take 1 tablet (30 mg total) by mouth daily. 06/10/15  Yes Isaiah Serge, NP  loratadine (CLARITIN) 10 MG tablet Take 10 mg by mouth daily as needed for allergies.    Yes Historical  Provider, MD  losartan (COZAAR) 100 MG tablet TAKE 1 TABLET DAILY. Patient taking differently: Take 1 tablet by mouth daily in the evening. 05/04/15  Yes Deboraha Sprang, MD  MAGNESIUM PO Take 4 tablets by mouth 2 (two) times daily.    Yes Historical Provider, MD  Multiple Vitamins-Minerals (EYE VITAMINS PO) Take 1 tablet by mouth 2 (two) times daily. Macular Supplement   Yes Historical Provider, MD  nitroGLYCERIN (NITROSTAT) 0.4 MG SL tablet Place 1 tablet (0.4 mg total) under the tongue every 5 (five) minutes as needed for chest pain. 12/23/14  Yes Deboraha Sprang, MD  NON FORMULARY c pap   Yes Historical Provider, MD  pantoprazole (  PROTONIX) 40 MG tablet TAKE 1 TABLET EACH DAY. 12/16/15  Yes Jerene Bears, MD   Physical Exam: Vitals:   02/14/16 0521 02/14/16 0615 02/14/16 0622 02/14/16 0700  BP: 130/86 132/84  128/87  Pulse:  (!) 131  (!) 133  Resp: 19 16  16   Temp:  97.8 F (36.6 C)    TempSrc:  Oral    SpO2: 95% 98%  94%  Weight:   94.8 kg (208 lb 15.9 oz)   Height:   5' 8.5" (1.74 m)    Constitutional: He is oriented to person, place, and time. He appears well-developed and well-nourished. No distress.  HENT:Head: Normocephalic and atraumatic.  Nose: Nose normal.  Mouth/Throat: Oropharynx is clear and moist and mucous membranes are normal.  Eyes: Conjunctivae and EOM are normal. Pupils are equal, round, and reactive to light.  Neck: Normal range of motion. Neck supple.  Cardiovascular: S1 normal and S2 normal.  An irregularly irregular rhythm present. Tachycardia present.  Exam reveals no gallop and no friction rub.   No murmur heard. Pulmonary/Chest: Effort normal and breath sounds normal. No respiratory distress. He exhibits no tenderness.  Abdominal: Soft. Normal appearance and bowel sounds are normal. There is no hepatosplenomegaly. There is no tenderness. There is no rebound, no guarding, no tenderness at McBurney's point and negative Murphy's sign. No hernia.  Musculoskeletal:  Normal range of motion.  Neurological: He is alert and oriented to person, place, and time. He has normal strength. No cranial nerve deficit or sensory deficit. Coordination normal.  Skin: Skin is warm, dry and intact. No rash noted. No cyanosis.  Psychiatric: He has a normal mood and affect. His speech is normal and behavior is normal. Thought content normal.   Labs on Admission:  Basic Metabolic Panel:  Recent Labs Lab 02/14/16 0348  NA 140  K 4.3  CL 107  CO2 24  GLUCOSE 106*  BUN 22*  CREATININE 1.03  CALCIUM 9.3   Liver Function Tests: No results for input(s): AST, ALT, ALKPHOS, BILITOT, PROT, ALBUMIN in the last 168 hours. No results for input(s): LIPASE, AMYLASE in the last 168 hours. No results for input(s): AMMONIA in the last 168 hours. CBC:  Recent Labs Lab 02/14/16 0348  WBC 6.9  HGB 13.7  HCT 40.6  MCV 96.7  PLT 190   Cardiac Enzymes: No results for input(s): CKTOTAL, CKMB, CKMBINDEX, TROPONINI in the last 168 hours.  BNP (last 3 results) No results for input(s): PROBNP in the last 8760 hours. CBG: No results for input(s): GLUCAP in the last 168 hours.  Radiological Exams on Admission: Dg Chest 2 View  Result Date: 02/14/2016 CLINICAL DATA:  Chest pain beginning tonight. Irregular heart rates. Former smoker. EXAM: CHEST  2 VIEW COMPARISON:  04/21/2015 FINDINGS: Emphysematous changes in the lungs. Normal heart size and pulmonary vascularity. No focal airspace disease or consolidation in the lungs. No blunting of costophrenic angles. No pneumothorax. Mediastinal contours appear intact. Degenerative changes in the spine and shoulders. Calcified and tortuous aorta. IMPRESSION: Emphysematous changes in the lungs. No evidence of active pulmonary disease. Electronically Signed   By: Lucienne Capers M.D.   On: 02/14/2016 04:20   EKG: Independently reviewed. Afib with RVR  Assessment/Plan Principal Problem:   Atrial fibrillation with RVR (HCC) Active  Problems:   Essential hypertension   Hyperlipidemia with target LDL less than 100   OSA (obstructive sleep apnea)   Habitual alcohol use   Hearing loss  1. Atrial fibrillation with RVR -  Pt was admitted to SDU on diltiazem drip.  His HR is difficult to control on max dose of IV diltiazem, IV digoxin was also given to no significant effect.  Will consult cardiology service for management recommendations.  Cycle troponin.  See orders. Pt is taking apixaban for anticoagulation. CHADS2VASC of  4 2. Essential Hypertension - resume home oral medications. 3. OSA - nightly CPAP ordered.  4. Cardiomyopathy - ordered 2 D echocardiogram.   5. CAD - given initial chest pain complaints will cycle troponin.   6. GERD - protonix ordered for GI protection.     DVT Prophylaxis: eliquis Code Status: full  Family Communication:   Disposition Plan: TBD   Critical Care Time spent: 76 mins  Irwin Brakeman, MD [facility] Pager [phone]  If 7PM-7AM, please contact night-coverage [email or web address] Password [email or web address] 02/14/2016, 7:39 AM

## 2016-02-14 NOTE — Progress Notes (Signed)
Patient brought home CPAP machine. Setting is per home setting on Patients home machine. Checked cord. Cord is fine with no problems. RT will continue to monitor.

## 2016-02-14 NOTE — ED Notes (Signed)
Heart rate in triage 184 informed Charge RN patient to room.

## 2016-02-14 NOTE — Consult Note (Addendum)
CONSULT NOTE  Date: 02/14/2016               Patient Name:  Donald [name] MRN: [id number]  DOB: 1933/05/06 Age / Sex: 82 y.o., male        PCP: [name] Primary Cardiologist: Caryl Comes            Referring Physician: Irwin Brakeman              Reason for Consult: Atrial fib            History of Present Illness: Patient is a 82 y.o. male with a PMHx of PAF ( on Eliquis ) , tachycardia mediated cardiomyopathy, functionally bicuspid AV, chronic diastolic CHF, HTN, HLD  , who was admitted to [facility] on 02/14/2016 for evaluation of CP and atrial fib  He had a myoview in June, 2016 that showed mild ischemi in the lateral well.   Was set up for cath but then was found to have a Hb of 7. The cath was cancelled.  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  Marland Kitchen  He presented with rapid A-fib. He converted to NSR and has been having some pauses 3-6 seconds this am      Medications: Outpatient medications: Prescriptions Prior to Admission  Medication Sig Dispense Refill Last Dose  . apixaban (ELIQUIS) 5 MG TABS tablet Take 1 tablet (5 mg total) by mouth 2 (two) times daily. 60 tablet 11 02/13/2016 at 2130  . atorvastatin (LIPITOR) 40 MG tablet Take 1 tablet (40 mg total) by mouth daily. Yearly physical w/labs are due must see MD for future refills 90 tablet 0 02/13/2016 at Unknown time  . diltiazem (DILACOR XR) 240 MG 24 hr capsule Take 240 mg by mouth daily as needed (increased heart rate).   02/14/2016 at Unknown time  . ferrous sulfate 325 (65 FE) MG tablet Take 1 tablet (325 mg total) by mouth daily with breakfast. 60 tablet 3 02/13/2016 at Unknown time  . fluticasone (FLONASE) 50 MCG/ACT nasal spray USE 1 SPRAY EACH NOSTRIL ONCE DAILY.    SHAKE GENTLY   (Patient taking differently: 1 spray in each nostril daily as needed for stuffiness) 16 g 11 02/13/2016 at Unknown time  . furosemide (LASIX) 40 MG tablet TAKE 1 TABLET EACH DAY. 30 tablet 3 02/13/2016 at  Unknown time  . isosorbide mononitrate (IMDUR) 30 MG 24 hr tablet Take 1 tablet (30 mg total) by mouth daily. 90 tablet 3 02/13/2016 at Unknown time  . loratadine (CLARITIN) 10 MG tablet Take 10 mg by mouth daily as needed for allergies.    Past Week at Unknown time  . losartan (COZAAR) 100 MG tablet TAKE 1 TABLET DAILY. (Patient taking differently: Take 1 tablet by mouth daily in the evening.) 90 tablet 3 02/13/2016 at Unknown time  . MAGNESIUM PO Take 4 tablets by mouth 2 (two) times daily.    02/13/2016 at Unknown time  . Multiple Vitamins-Minerals (EYE VITAMINS PO) Take 1 tablet by mouth 2 (two) times daily. Macular Supplement   02/13/2016 at Unknown time  . nitroGLYCERIN (NITROSTAT) 0.4 MG SL tablet Place 1 tablet (0.4 mg total) under the tongue every 5 (five) minutes as needed for chest pain. 25 tablet 1 02/14/2016 at Unknown time  . NON FORMULARY c pap   02/13/2016 at Unknown time  . pantoprazole (PROTONIX) 40 MG tablet TAKE 1 TABLET EACH DAY. 30 tablet 3 02/13/2016 at Unknown  time    Current medications: Current Facility-Administered Medications  Medication Dose Route Frequency Provider Last Rate Last Dose  . acetaminophen (TYLENOL) tablet 650 mg  650 mg Oral Q6H PRN Clanford Marisa Hua, MD       Or  . acetaminophen (TYLENOL) suppository 650 mg  650 mg Rectal Q6H PRN Clanford Marisa Hua, MD      . apixaban (ELIQUIS) tablet 5 mg  5 mg Oral BID Clanford Marisa Hua, MD      . atorvastatin (LIPITOR) tablet 40 mg  40 mg Oral Daily Clanford L Johnson, MD      . diltiazem (CARDIZEM) 100 mg in dextrose 5% 120mL (1 mg/mL) infusion  5-15 mg/hr Intravenous Continuous Orpah Greek, MD 10 mL/hr at 02/14/16 0723 10 mg/hr at 02/14/16 0723  . diltiazem (DILACOR XR) 24 hr capsule 240 mg  240 mg Oral Daily Clanford L Johnson, MD      . ferrous sulfate tablet 325 mg  325 mg Oral Q breakfast Clanford Marisa Hua, MD      . furosemide (LASIX) tablet 40 mg  40 mg Oral Daily Clanford Marisa Hua, MD      .  isosorbide mononitrate (IMDUR) 24 hr tablet 30 mg  30 mg Oral Daily Clanford Marisa Hua, MD      . losartan (COZAAR) tablet 100 mg  100 mg Oral QPM Clanford L Johnson, MD      . magnesium oxide (MAG-OX) tablet 200 mg  200 mg Oral BID Clanford Marisa Hua, MD      . metoprolol (LOPRESSOR) injection 5 mg  5 mg Intravenous Once Clanford Marisa Hua, MD      . nitroGLYCERIN (NITROSTAT) SL tablet 0.4 mg  0.4 mg Sublingual Q5 min PRN Clanford L Johnson, MD      . pantoprazole (PROTONIX) EC tablet 40 mg  40 mg Oral Daily Clanford L Johnson, MD      . sodium chloride flush (NS) 0.9 % injection 3 mL  3 mL Intravenous Q12H Clanford Marisa Hua, MD         No Known Allergies   Past Medical History:  Diagnosis Date  . Abnormal CT scan, stomach 02/2014   Thickening of gastric fundus and cardia.  gastritis on EGD 03/2014  . Allergic rhinitis due to pollen   . Arthropathy, unspecified, site unspecified   . Atherosclerosis    a. Noted by abdominal CT 02/2014 (h/o normal nuc 2011).  . Atrial flutter ([place]) 02/24/14   s/p ablation 11/15  . Cardiomyopathy ([place])    tachy mediated - a. TEE (10/15):  EF 30%;  b. Echo after NSR restored (10/15):  mild LVH, EF 55-60%, mild AS, mild AI, mild MR, mild to mod LAE, mild RAE  . Diverticulosis    severe in descending, sigmoid colon.   . ED (erectile dysfunction)   . Esophageal stricture 03/2014   traversable with endoscope. not dilated.   . Esophageal stricture   . Gastric AVM   . GERD (gastroesophageal reflux disease) 03/2014   small HH and gastritis on EGD  . GI bleed   . Habitual alcohol use   . Hemorrhoids   . Hiatal hernia   . Hyperlipidemia   . Hypertension   . Obesity   . OSA on CPAP   . PAF (paroxysmal atrial fibrillation) ([place])   . Prostatitis, unspecified   . Sinus bradycardia   . Skin cancer    basal and squamous cell    Past Surgical History:  Procedure Laterality Date  . ABLATION  03/19/2014   RFCA of atrial flutter by Dr Caryl Comes  . ATRIAL  FLUTTER ABLATION N/A 03/19/2014   Procedure: ATRIAL FLUTTER ABLATION;  Surgeon: Deboraha Sprang, MD;  Location[facility] CATH LAB;  Service: Cardiovascular;  Laterality: N/A;  . CARDIOVERSION N/A 02/24/2014   Procedure: CARDIOVERSION;  Surgeon: Lelon Perla, MD;  Location: [place];  Service: Cardiovascular;  Laterality: N/A;  . CARDIOVERSION Right 09/10/2014   Procedure: CARDIOVERSION;  Surgeon: Deboraha Sprang, MD;  Location: [facility] CATH LAB;  Service: Cardiovascular;  Laterality: Right;  . COLONOSCOPY N/A 04/23/2015   Procedure: COLONOSCOPY;  Surgeon: Ladene Artist, MD;  Location: [facility] ENDOSCOPY;  Service: Endoscopy;  Laterality: N/A;  . ENTEROSCOPY N/A 06/12/2015   Procedure: ENTEROSCOPY;  Surgeon: Ladene Artist, MD;  Location: [facility] ENDOSCOPY;  Service: Endoscopy;  Laterality: N/A;  . ESOPHAGOGASTRODUODENOSCOPY N/A 04/09/2014   Procedure: ESOPHAGOGASTRODUODENOSCOPY (EGD);  Surgeon: Inda Castle, MD;  Location: [place];  Service: Endoscopy;  Laterality: N/A;  . ESOPHAGOGASTRODUODENOSCOPY N/A 04/23/2015   Procedure: ESOPHAGOGASTRODUODENOSCOPY (EGD);  Surgeon: Ladene Artist, MD;  Location: [facility] ENDOSCOPY;  Service: Endoscopy;  Laterality: N/A;  . HERNIA REPAIR    . INGUINAL HERNIA REPAIR     right  . INGUINAL HERNIA REPAIR     left  . ORIF fracture of the elbow    . SKIN CANCER EXCISION  05/21/12   Squamous cell ca  . SQUAMOUS CELL CARCINOMA EXCISION    . TEE WITHOUT CARDIOVERSION N/A 02/24/2014   Procedure: TRANSESOPHAGEAL ECHOCARDIOGRAM (TEE);  Surgeon: Lelon Perla, MD;  Location: [facility] ENDOSCOPY;  Service: Cardiovascular;  Laterality: N/A;  . TONSILLECTOMY      Family History  Problem Relation Age of Onset  . Cancer Mother     ovarian  . Other Father     renal disease- grief over loss of spouse  . Cancer Brother     prostate- died of hematologic disorder 2nd to chemo  . Pulmonary fibrosis Brother   . Diabetes Neg Hx   . Coronary artery disease Neg Hx   . Colon cancer Neg  Hx   . Stomach cancer Neg Hx   . Rectal cancer Neg Hx     Social History:  reports that he quit smoking about 7 years ago. His smoking use included Cigarettes and Cigars. He smoked 0.00 packs per day for 40.00 years. His smokeless tobacco use includes Chew. He reports that he drinks about 12.6 oz of alcohol per week . He reports that he does not use drugs.   Review of Systems: Constitutional:  denies fever, chills, diaphoresis, appetite change and fatigue.  HEENT: denies photophobia, eye pain, redness, hearing loss, ear pain, congestion, sore throat, rhinorrhea, sneezing, neck pain, neck stiffness and tinnitus.  Respiratory: denies SOB, DOE, cough, chest tightness, and wheezing.  Cardiovascular: admits to chest pain, palpitations and leg swelling.  Gastrointestinal: denies nausea, vomiting, abdominal pain, diarrhea, constipation, blood in stool.  Genitourinary: denies dysuria, urgency, frequency, hematuria, flank pain and difficulty urinating.  Musculoskeletal: denies  myalgias, back pain, joint swelling, arthralgias and gait problem.   Skin: denies pallor, rash and wound.  Neurological: denies dizziness, seizures, syncope, weakness, light-headedness, numbness and headaches.   Hematological: denies adenopathy, easy bruising, personal or family bleeding history.  Psychiatric/ Behavioral: denies suicidal ideation, mood changes, confusion, nervousness, sleep disturbance and agitation.    Physical Exam: BP 128/87   Pulse (!) 133   Temp 98.1 F (36.7 C) (Oral)  Resp 16   Ht 5' 8.5" (1.74 m)   Wt 208 lb 15.9 oz (94.8 kg)   SpO2 94%   BMI 31.32 kg/m   Wt Readings from Last 3 Encounters:  02/14/16 208 lb 15.9 oz (94.8 kg)  08/17/15 218 lb 12.8 oz (99.2 kg)  07/06/15 220 lb (99.8 kg)    General: Vital signs reviewed and noted. Well-developed, well-nourished, in no acute distress; alert,   Head: Normocephalic, atraumatic, sclera anicteric,   Neck: Supple. Negative for carotid  bruits. No JVD   Lungs:  Clear bilaterally, no  wheezes, rales, or rhonchi. Breathing is normal   Heart: Jirreg. Irreg.  with S1 S2.  Soft systolic  Murmur, no  rubs, or gallops   Abdomen/ GI :  Soft, non-tender, non-distended with normoactive bowel sounds. No hepatomegaly. No rebound/guarding. No obvious abdominal masses   MSK: Strength and the appear normal for age.   Extremities: No clubbing or cyanosis. No edema.  Distal pedal pulses are 2+ and equal   Neurologic:  CN are grossly intact,  No obvious motor or sensory defect.  Alert and oriented X 3. Moves all extremities spontaneously.  Psych: Responds to questions appropriately with a normal affect.     Lab results: Basic Metabolic Panel:  Recent Labs Lab 02/14/16 0348 02/14/16 0753  NA 140  --   K 4.3  --   CL 107  --   CO2 24  --   GLUCOSE 106*  --   BUN 22*  --   CREATININE 1.03  --   CALCIUM 9.3  --   MG  --  2.0  PHOS  --  2.7    Liver Function Tests: No results for input(s): AST, ALT, ALKPHOS, BILITOT, PROT, ALBUMIN in the last 168 hours. No results for input(s): LIPASE, AMYLASE in the last 168 hours. No results for input(s): AMMONIA in the last 168 hours.  CBC:  Recent Labs Lab 02/14/16 0348  WBC 6.9  HGB 13.7  HCT 40.6  MCV 96.7  PLT 190    Cardiac Enzymes:  Recent Labs Lab 02/14/16 0753  TROPONINI 0.06*    BNP: Invalid input(s): POCBNP  CBG: No results for input(s): GLUCAP in the last 168 hours.  Coagulation Studies: No results for input(s): LABPROT, INR in the last 72 hours.   Other results:  Personal review of EKG shows : atrial fib with RVR    Imaging: Dg Chest 2 View  Result Date: 02/14/2016 CLINICAL DATA:  Chest pain beginning tonight. Irregular heart rates. Former smoker. EXAM: CHEST  2 VIEW COMPARISON:  04/21/2015 FINDINGS: Emphysematous changes in the lungs. Normal heart size and pulmonary vascularity. No focal airspace disease or consolidation in the lungs. No blunting of  costophrenic angles. No pneumothorax. Mediastinal contours appear intact. Degenerative changes in the spine and shoulders. Calcified and tortuous aorta. IMPRESSION: Emphysematous changes in the lungs. No evidence of active pulmonary disease. Electronically Signed   By: Lucienne Capers M.D.   On: 02/14/2016 04:20           Assessment & Plan:   1.  Sick sinus syndrome:  CHADS2VASC of at least  4  ( CAD, age 35, HTN)   Has rapid atrial fib and has long pauses when converting sinus rhythm on IV Dilt. I think he will need a pacer. Will arrange for pacer tomorrow, transport via [facility]   2. CAD :   Had a myoview that revealed a basal inferior defect . Was scheduled for cath  but it was cancelled due to profound anemia.   The anemia issue has been resolved ( cauterization of an AVM in the stomach)  May need a cath He is not having significant CP at present but does have some episodes of tightness.   3.       Thayer Headings, Brooke Bonito., MD, [facility] 02/14/2016, 9:03 AM Office - [phone] Pager [phone]

## 2016-02-14 NOTE — Progress Notes (Signed)
Patient had 5 second asystole pause on EKG and then converted to sinus Loletha Grayer. Diltiazem paused and MD at bedside. MD requested turn Cadizem back on at 5. RN to continue to monitor.

## 2016-02-14 NOTE — Progress Notes (Signed)
10/1 @ 0555 Pt admitted from ED, Dr. Myna Hidalgo paged and notified of pts arrival on unit.  AQlso notifed Dr. Myna Hidalgo of pt's heart rate of 140-150 with Cardizem gtt already at 15mg /hr.  He advised possibly won't be able to see patient until after 0700, and that he would look through patient's chart and might order some Digoxin to help with the heart rate.  I advised "sounds good, will be looking for order".  Francia Greaves Allure Greaser,RN,BSN,CCRN

## 2016-02-14 NOTE — Progress Notes (Signed)
Pt heart rate less than 60 Cardizem gtt. paused and MD paged per protocol.

## 2016-02-14 NOTE — ED Triage Notes (Signed)
Patient c/o chest pain that began tonight.  Patient states that heart rate has been up and down all night. Heart rate anywhere from 40-180.  Patient states that took nitro at 2 am with some relief.  Patient c/o chest discomfort in triage.

## 2016-02-14 NOTE — Progress Notes (Signed)
CRITICAL VALUE ALERT  Critical value received:  Troponin 0.06  Date of notification:  02-14-16  Time of notification:  0854  Critical value read back:Yes  Nurse who received alert:  Yehuda Budd  MD notified (1st page):  Wynetta Emery  Time of first page:  [phone]  MD also made aware of the pauses on EKG last 3-6 seconds, RN to continue to monitor.

## 2016-02-14 NOTE — ED Provider Notes (Addendum)
[facility] DEPT Provider Note   CSN: [id number] Arrival date & time: 02/14/16  [id number]  By signing my name below, I, Royce Macadamia, attest that this documentation has been prepared under the direction and in the presence of Orpah Greek, MD . Electronically Signed: Royce Macadamia, Scribe. 02/14/2016. 3:45 AM.  History   Chief Complaint Chief Complaint  Patient presents with  . Chest Pain   The history is provided by the patient and medical records. No language interpreter was used.    HPI Comments:  Donald [name] is a 82 y.o. male with a history of atrial flutter who presents to the Emergency Department complaining of chest pain and an irregular HR beginning tonight.  He reports SOB stating he "feels like he's been running" and adds that he measured his heart rate at home and it was fluctuating between 40 and 180.  Pt reports taking nitroglycerin with some relief.  No additional injury or complaint.    Past Medical History:  Diagnosis Date  . Abnormal CT scan, stomach 02/2014   Thickening of gastric fundus and cardia.  gastritis on EGD 03/2014  . Allergic rhinitis due to pollen   . Arthropathy, unspecified, site unspecified   . Atherosclerosis    a. Noted by abdominal CT 02/2014 (h/o normal nuc 2011).  . Atrial flutter ([place]) 02/24/14   s/p ablation 11/15  . Cardiomyopathy ([place])    tachy mediated - a. TEE (10/15):  EF 30%;  b. Echo after NSR restored (10/15):  mild LVH, EF 55-60%, mild AS, mild AI, mild MR, mild to mod LAE, mild RAE  . Diverticulosis    severe in descending, sigmoid colon.   . ED (erectile dysfunction)   . Esophageal stricture 03/2014   traversable with endoscope. not dilated.   . Esophageal stricture   . Gastric AVM   . GERD (gastroesophageal reflux disease) 03/2014   small HH and gastritis on EGD  . GI bleed   . Habitual alcohol use   . Hemorrhoids   . Hiatal hernia   . Hyperlipidemia   . Hypertension   . Obesity   . OSA on CPAP     . PAF (paroxysmal atrial fibrillation) ([place])   . Prostatitis, unspecified   . Sinus bradycardia   . Skin cancer    basal and squamous cell    Patient Active Problem List   Diagnosis Date Noted  . Melena 06/10/2015  . Acute sinus infection 05/28/2015  . Iron deficiency anemia due to chronic blood loss 04/29/2015  . Anticoagulant long-term use   . Anemia due to blood loss 04/21/2015  . UGIB (upper gastrointestinal bleed) 04/21/2015  . Hearing loss 01/20/2015  . Esophageal stricture 04/09/2014  . Gastritis and gastroduodenitis 04/09/2014  . PAF (paroxysmal atrial fibrillation) ([place]) 04/08/2014  . Atrial flutter ([place]) 03/19/2014  . Habitual alcohol use   . Obesity   . OSA (obstructive sleep apnea) 02/03/2014  . Hyperlipidemia with target LDL less than 100 05/07/2013  . Routine health maintenance 04/19/2011  . Sinus bradycardia 05/21/2009  . Essential hypertension 03/10/2008  . ERECTILE DYSFUNCTION 03/20/2007  . ATHEROSCLEROSIS, AORTIC 01/24/2007  . Allergic rhinitis due to pollen 01/24/2007    Past Surgical History:  Procedure Laterality Date  . ABLATION  03/19/2014   RFCA of atrial flutter by Dr Caryl Comes  . ATRIAL FLUTTER ABLATION N/A 03/19/2014   Procedure: ATRIAL FLUTTER ABLATION;  Surgeon: Deboraha Sprang, MD;  Location: [facility] CATH LAB;  Service: Cardiovascular;  Laterality: N/A;  .  CARDIOVERSION N/A 02/24/2014   Procedure: CARDIOVERSION;  Surgeon: Lelon Perla, MD;  Location: [facility];  Service: Cardiovascular;  Laterality: N/A;  . CARDIOVERSION Right 09/10/2014   Procedure: CARDIOVERSION;  Surgeon: Deboraha Sprang, MD;  Location: [facility] CATH LAB;  Service: Cardiovascular;  Laterality: Right;  . COLONOSCOPY N/A 04/23/2015   Procedure: COLONOSCOPY;  Surgeon: Ladene Artist, MD;  Location: [facility] ENDOSCOPY;  Service: Endoscopy;  Laterality: N/A;  . ENTEROSCOPY N/A 06/12/2015   Procedure: ENTEROSCOPY;  Surgeon: Ladene Artist, MD;  Location: [facility] ENDOSCOPY;  Service: Endoscopy;   Laterality: N/A;  . ESOPHAGOGASTRODUODENOSCOPY N/A 04/09/2014   Procedure: ESOPHAGOGASTRODUODENOSCOPY (EGD);  Surgeon: Inda Castle, MD;  Location: [facility];  Service: Endoscopy;  Laterality: N/A;  . ESOPHAGOGASTRODUODENOSCOPY N/A 04/23/2015   Procedure: ESOPHAGOGASTRODUODENOSCOPY (EGD);  Surgeon: Ladene Artist, MD;  Location: [facility] ENDOSCOPY;  Service: Endoscopy;  Laterality: N/A;  . HERNIA REPAIR    . INGUINAL HERNIA REPAIR     right  . INGUINAL HERNIA REPAIR     left  . ORIF fracture of the elbow    . SKIN CANCER EXCISION  05/21/12   Squamous cell ca  . SQUAMOUS CELL CARCINOMA EXCISION    . TEE WITHOUT CARDIOVERSION N/A 02/24/2014   Procedure: TRANSESOPHAGEAL ECHOCARDIOGRAM (TEE);  Surgeon: Lelon Perla, MD;  Location: [facility] ENDOSCOPY;  Service: Cardiovascular;  Laterality: N/A;  . TONSILLECTOMY         Home Medications    Prior to Admission medications   Medication Sig Start Date End Date Taking? Authorizing Provider  apixaban (ELIQUIS) 5 MG TABS tablet Take 1 tablet (5 mg total) by mouth 2 (two) times daily. 07/06/15   Deboraha Sprang, MD  atorvastatin (LIPITOR) 40 MG tablet Take 1 tablet (40 mg total) by mouth daily. Yearly physical w/labs are due must see MD for future refills 02/03/16   Janith Lima, MD  diltiazem [facility] CD) 240 MG 24 hr capsule TAKE 1 CAPSULE EVERY DAY. 11/04/15   Liliane Shi, PA-C  diltiazem (DILACOR XR) 240 MG 24 hr capsule Take 240 mg by mouth daily as needed (increased heart rate).    Historical Provider, MD  ferrous sulfate 325 (65 FE) MG tablet Take 1 tablet (325 mg total) by mouth daily with breakfast. 04/25/15   Reyne Dumas, MD  fluticasone (FLONASE) 50 MCG/ACT nasal spray USE 1 SPRAY EACH NOSTRIL ONCE DAILY.    SHAKE GENTLY  12/24/15   Janith Lima, MD  furosemide (LASIX) 40 MG tablet TAKE 1 TABLET EACH DAY. 01/14/16   Janith Lima, MD  isosorbide mononitrate (IMDUR) 30 MG 24 hr tablet Take 1 tablet (30 mg total) by mouth daily. 06/10/15    Isaiah Serge, NP  loratadine (CLARITIN) 10 MG tablet Take 10 mg by mouth daily as needed for allergies.     Historical Provider, MD  losartan (COZAAR) 100 MG tablet TAKE 1 TABLET DAILY. Patient taking differently: Take 1 tablet by mouth daily in the evening. 05/04/15   Deboraha Sprang, MD  MAGNESIUM PO Take 2 tablets by mouth 2 (two) times daily.    Historical Provider, MD  Multiple Vitamins-Minerals (EYE VITAMINS PO) Take 1 tablet by mouth 2 (two) times daily. Macular Supplement    Historical Provider, MD  nitroGLYCERIN (NITROSTAT) 0.4 MG SL tablet Place 1 tablet (0.4 mg total) under the tongue every 5 (five) minutes as needed for chest pain. 12/23/14   Deboraha Sprang, MD  pantoprazole (PROTONIX) 40 MG tablet  TAKE 1 TABLET EACH DAY. 12/16/15   Jerene Bears, MD    Family History Family History  Problem Relation Age of Onset  . Cancer Mother     ovarian  . Other Father     renal disease- grief over loss of spouse  . Cancer Brother     prostate- died of hematologic disorder 2nd to chemo  . Pulmonary fibrosis Brother   . Diabetes Neg Hx   . Coronary artery disease Neg Hx   . Colon cancer Neg Hx   . Stomach cancer Neg Hx   . Rectal cancer Neg Hx     Social History Social History  Substance Use Topics  . Smoking status: Former Smoker    Packs/day: 0.00    Years: 40.00    Types: Cigarettes, Cigars    Quit date: 09/18/2008  . Smokeless tobacco: Current User    Types: Chew     Comment: Has Occasional Cigar/ quit   . Alcohol use 12.6 oz/week    21 Glasses of wine per week     Allergies   Review of patient's allergies indicates no known allergies.   Review of Systems Review of Systems  Respiratory: Positive for shortness of breath.   Cardiovascular: Positive for chest pain.  All other systems reviewed and are negative.    Physical Exam Updated Vital Signs BP 117/79   Pulse (!) 48   Temp 97.9 F (36.6 C) (Oral)   Resp 19   Ht 5' 8.5" (1.74 m)   Wt 208 lb (94.3 kg)    SpO2 96%   BMI 31.17 kg/m   Physical Exam  Constitutional: He is oriented to person, place, and time. He appears well-developed and well-nourished. No distress.  HENT:  Head: Normocephalic and atraumatic.  Right Ear: Hearing normal.  Left Ear: Hearing normal.  Nose: Nose normal.  Mouth/Throat: Oropharynx is clear and moist and mucous membranes are normal.  Eyes: Conjunctivae and EOM are normal. Pupils are equal, round, and reactive to light.  Neck: Normal range of motion. Neck supple.  Cardiovascular: S1 normal and S2 normal.  An irregularly irregular rhythm present. Tachycardia present.  Exam reveals no gallop and no friction rub.   No murmur heard. Pulmonary/Chest: Effort normal and breath sounds normal. No respiratory distress. He exhibits no tenderness.  Abdominal: Soft. Normal appearance and bowel sounds are normal. There is no hepatosplenomegaly. There is no tenderness. There is no rebound, no guarding, no tenderness at McBurney's point and negative Murphy's sign. No hernia.  Musculoskeletal: Normal range of motion.  Neurological: He is alert and oriented to person, place, and time. He has normal strength. No cranial nerve deficit or sensory deficit. Coordination normal. GCS eye subscore is 4. GCS verbal subscore is 5. GCS motor subscore is 6.  Skin: Skin is warm, dry and intact. No rash noted. No cyanosis.  Psychiatric: He has a normal mood and affect. His speech is normal and behavior is normal. Thought content normal.  Nursing note and vitals reviewed.    ED Treatments / Results   DIAGNOSTIC STUDIES:  Oxygen Saturation is 99% on RA, NML by my interpretation.    COORDINATION OF CARE:  3:45 AM Discussed treatment plan with pt at bedside and pt agreed to plan.  Labs (all labs ordered are listed, but only abnormal results are displayed) Labs Reviewed  BASIC METABOLIC PANEL - Abnormal; Notable for the following:       Result Value   Glucose, Bld 106 (*)  BUN 22 (*)     All other components within normal limits  CBC - Abnormal; Notable for the following:    RBC 4.20 (*)    All other components within normal limits  I-STAT TROPOININ, ED    EKG  EKG Interpretation  Date/Time:  Sunday February 14 2016 03:39:07 EDT Ventricular Rate:  167 PR Interval:    QRS Duration: 89 QT Interval:  287 QTC Calculation: 446 R Axis:   36 Text Interpretation:  Supraventricular tachycardia Repolarization abnormality, prob rate related Baseline wander in lead(s) V5 Confirmed by Betsey Holiday  MD, Elasia Furnish [phone]) on 02/14/2016 4:04:20 AM       Radiology Dg Chest 2 View  Result Date: 02/14/2016 CLINICAL DATA:  Chest pain beginning tonight. Irregular heart rates. Former smoker. EXAM: CHEST  2 VIEW COMPARISON:  04/21/2015 FINDINGS: Emphysematous changes in the lungs. Normal heart size and pulmonary vascularity. No focal airspace disease or consolidation in the lungs. No blunting of costophrenic angles. No pneumothorax. Mediastinal contours appear intact. Degenerative changes in the spine and shoulders. Calcified and tortuous aorta. IMPRESSION: Emphysematous changes in the lungs. No evidence of active pulmonary disease. Electronically Signed   By: Lucienne Capers M.D.   On: 02/14/2016 04:20    Procedures Procedures (including critical care time)  Medications Ordered in ED Medications  diltiazem (CARDIZEM) 1 mg/mL load via infusion 10 mg (10 mg Intravenous Bolus from Bag 02/14/16 0400)    And  diltiazem (CARDIZEM) 100 mg in dextrose 5% 130mL (1 mg/mL) infusion (15 mg/hr Intravenous Rate/Dose Change 02/14/16 0458)     Initial Impression / Assessment and Plan / ED Course  I have reviewed the triage vital signs and the nursing notes.  Pertinent labs & imaging results that were available during my care of the patient were reviewed by me and considered in my medical decision making (see chart for details).  Clinical Course    Patient with previous history of atrial  fibrillation and atrial flutter presents to the emergency part for with chest discomfort. Patient reports that he has been experiencing a fast and irregular heartbeat. He feels short of breath and slight discomfortIn the chest. At arrival he was found to be in atrial fibrillation with rapid ventricular response. He is anticoagulated on Eliquis.  EKG does not show obvious ischemia and there is no evidence of acute infarct. Patient placed on Cardizem drip and was given a bolus. He is having only partial response to the Cardizem.  Further discussion with the patient reveals that he has been cardioverted in the past, but was only partially successful and ultimately needed an ablation. Based on this, it is felt the patient would be best served to be admitted to the hospital rather than attempt ER cardioversion. Will admit to stepdown unit on Cardizem drip.  CRITICAL CARE Performed by: Orpah Greek   Total critical care time: 30 minutes  Critical care time was exclusive of separately billable procedures and treating other patients.  Critical care was necessary to treat or prevent imminent or life-threatening deterioration.  Critical care was time spent personally by me on the following activities: development of treatment plan with patient and/or surrogate as well as nursing, discussions with consultants, evaluation of patient's response to treatment, examination of patient, obtaining history from patient or surrogate, ordering and performing treatments and interventions, ordering and review of laboratory studies, ordering and review of radiographic studies, pulse oximetry and re-evaluation of patient's condition.  CHA2DS2-VASc Score for Atrial Fibrillation Stroke Risk from [email or web address]  on 02/14/2016 ** All calculations should be rechecked by clinician prior to use **  RESULT SUMMARY: 5 points Stroke risk was 7.2% per year in >90,000 patients (the [place] Atrial Fibrillation Cohort Study)  and 10.0% risk of stroke/TIA/systemic embolism.  One recommendation suggests a 0 score is "low" risk and may not require anticoagulation; a 1 score is "low-moderate" risk and should consider antiplatelet or anticoagulation, and score 2 or greater is "moderate-high" risk and should otherwise be an anticoagulation candidate.   INPUTS: Age -> 2 = =75 Sex -> 0 = Male <abbr title='Congestive heart failure'>CHF</abbr> history -> 1 = Yes Hypertension history -> 1 = Yes Stroke/TIA/Thromboembolism history -> 0 = No Vascular disease history -> 1 = Yes Diabetes history -> 0 = No   CRITICAL CARE Performed by: Orpah Greek.   Total critical care time: 30 minutes  Critical care time was exclusive of separately billable procedures and treating other patients.  Critical care was necessary to treat or prevent imminent or life-threatening deterioration.  Critical care was time spent personally by me on the following activities: development of treatment plan with patient and/or surrogate as well as nursing, discussions with consultants, evaluation of patient's response to treatment, examination of patient, obtaining history from patient or surrogate, ordering and performing treatments and interventions, ordering and review of laboratory studies, ordering and review of radiographic studies, pulse oximetry and re-evaluation of patient's condition.   Final Clinical Impressions(s) / ED Diagnoses   Final diagnoses:  Atrial fibrillation with RVR (HCC)    New Prescriptions New Prescriptions   No medications on file   I personally performed the services described in this documentation, which was scribed in my presence. The recorded information has been reviewed and is accurate.    Orpah Greek, MD 02/14/16 [id number]    Orpah Greek, MD 02/14/16 [phone]

## 2016-02-15 ENCOUNTER — Inpatient Hospital Stay (HOSPITAL_COMMUNITY): Payer: PPO

## 2016-02-15 ENCOUNTER — Encounter (HOSPITAL_COMMUNITY): Payer: Self-pay | Admitting: Internal Medicine

## 2016-02-15 ENCOUNTER — Encounter: Payer: Self-pay | Admitting: Internal Medicine

## 2016-02-15 ENCOUNTER — Encounter (HOSPITAL_COMMUNITY)
Admission: EM | Disposition: A | Discharge status: Home / Self Care | Payer: Self-pay | Source: Home / Self Care | Attending: Internal Medicine

## 2016-02-15 DIAGNOSIS — I495 Sick sinus syndrome: Secondary | ICD-10-CM

## 2016-02-15 DIAGNOSIS — F1010 Alcohol abuse, uncomplicated: Secondary | ICD-10-CM | POA: Diagnosis not present

## 2016-02-15 DIAGNOSIS — Z7901 Long term (current) use of anticoagulants: Secondary | ICD-10-CM

## 2016-02-15 DIAGNOSIS — R7989 Other specified abnormal findings of blood chemistry: Secondary | ICD-10-CM

## 2016-02-15 DIAGNOSIS — R778 Other specified abnormalities of plasma proteins: Secondary | ICD-10-CM | POA: Diagnosis present

## 2016-02-15 DIAGNOSIS — I360 Nonrheumatic tricuspid (valve) stenosis: Secondary | ICD-10-CM | POA: Diagnosis not present

## 2016-02-15 DIAGNOSIS — I10 Essential (primary) hypertension: Secondary | ICD-10-CM | POA: Diagnosis not present

## 2016-02-15 DIAGNOSIS — I4891 Unspecified atrial fibrillation: Secondary | ICD-10-CM | POA: Diagnosis not present

## 2016-02-15 HISTORY — PX: EP IMPLANTABLE DEVICE: SHX172B

## 2016-02-15 LAB — COMPREHENSIVE METABOLIC PANEL
ALT: 16 U/L — ABNORMAL LOW (ref 17–63)
ANION GAP: 6 (ref 5–15)
AST: 15 U/L (ref 15–41)
Albumin: 3.6 g/dL (ref 3.5–5.0)
Alkaline Phosphatase: 62 U/L (ref 38–126)
BILIRUBIN TOTAL: 0.9 mg/dL (ref 0.3–1.2)
BUN: 21 mg/dL — AB (ref 6–20)
CHLORIDE: 108 mmol/L (ref 101–111)
CO2: 27 mmol/L (ref 22–32)
Calcium: 8.9 mg/dL (ref 8.9–10.3)
Creatinine, Ser: 0.97 mg/dL (ref 0.61–1.24)
Glucose, Bld: 106 mg/dL — ABNORMAL HIGH (ref 65–99)
POTASSIUM: 4.6 mmol/L (ref 3.5–5.1)
Sodium: 141 mmol/L (ref 135–145)
TOTAL PROTEIN: 6.4 g/dL — AB (ref 6.5–8.1)

## 2016-02-15 LAB — ECHOCARDIOGRAM LIMITED
Height: 68.5 in
Weight: 3343.94 oz

## 2016-02-15 LAB — CBC
HEMATOCRIT: 36.9 % — AB (ref 39.0–52.0)
Hemoglobin: 12.5 g/dL — ABNORMAL LOW (ref 13.0–17.0)
MCH: 32.8 pg (ref 26.0–34.0)
MCHC: 33.9 g/dL (ref 30.0–36.0)
MCV: 96.9 fL (ref 78.0–100.0)
Platelets: 175 10*3/uL (ref 150–400)
RBC: 3.81 MIL/uL — AB (ref 4.22–5.81)
RDW: 14 % (ref 11.5–15.5)
WBC: 9.5 10*3/uL (ref 4.0–10.5)

## 2016-02-15 SURGERY — PACEMAKER IMPLANT
Anesthesia: LOCAL

## 2016-02-15 MED ORDER — FENTANYL CITRATE (PF) 100 MCG/2ML IJ SOLN
INTRAMUSCULAR | Status: DC | PRN
  Administered 2016-02-15: 25 ug via INTRAVENOUS
  Administered 2016-02-15 (×2): 12.5 ug via INTRAVENOUS

## 2016-02-15 MED ORDER — ONDANSETRON HCL 4 MG/2ML IJ SOLN: 4.0000 mg | Freq: Four times a day (QID) | INTRAMUSCULAR | Status: DC | PRN

## 2016-02-15 MED ORDER — FENTANYL CITRATE (PF) 100 MCG/2ML IJ SOLN
INTRAMUSCULAR | Status: AC
  Filled 2016-02-15: qty 2

## 2016-02-15 MED ORDER — MIDAZOLAM HCL 5 MG/5ML IJ SOLN
INTRAMUSCULAR | Status: DC | PRN
  Administered 2016-02-15 (×4): 1 mg via INTRAVENOUS

## 2016-02-15 MED ORDER — CEFAZOLIN SODIUM-DEXTROSE 2-4 GM/100ML-% IV SOLN
INTRAVENOUS | Status: AC
  Filled 2016-02-15: qty 100

## 2016-02-15 MED ORDER — LIDOCAINE HCL (PF) 1 % IJ SOLN
INTRAMUSCULAR | Status: AC
  Filled 2016-02-15: qty 60

## 2016-02-15 MED ORDER — LIDOCAINE HCL (PF) 1 % IJ SOLN
INTRAMUSCULAR | Status: DC | PRN
Start: 2016-02-15 — End: 2016-02-15
  Administered 2016-02-15: 36 mL via INTRADERMAL

## 2016-02-15 MED ORDER — SODIUM CHLORIDE 0.9 % IV SOLN: INTRAVENOUS | Status: DC

## 2016-02-15 MED ORDER — HEPARIN (PORCINE) IN NACL 2-0.9 UNIT/ML-% IJ SOLN
INTRAMUSCULAR | Status: AC
  Filled 2016-02-15: qty 500

## 2016-02-15 MED ORDER — CEFAZOLIN SODIUM-DEXTROSE 2-4 GM/100ML-% IV SOLN
2.0000 g | INTRAVENOUS | Status: AC
  Administered 2016-02-15: 2 g via INTRAVENOUS
  Filled 2016-02-15: qty 100

## 2016-02-15 MED ORDER — SODIUM CHLORIDE 0.9 % IR SOLN
Status: AC
  Filled 2016-02-15: qty 2

## 2016-02-15 MED ORDER — SODIUM CHLORIDE 0.9 % IV SOLN: 250.0000 mL | INTRAVENOUS | Status: DC

## 2016-02-15 MED ORDER — CEFAZOLIN IN D5W 1 GM/50ML IV SOLN
1.0000 g | Freq: Four times a day (QID) | INTRAVENOUS | Status: AC
  Administered 2016-02-15 – 2016-02-16 (×3): 1 g via INTRAVENOUS
  Filled 2016-02-15 (×3): qty 50

## 2016-02-15 MED ORDER — MIDAZOLAM HCL 5 MG/5ML IJ SOLN
INTRAMUSCULAR | Status: AC
  Filled 2016-02-15: qty 5

## 2016-02-15 MED ORDER — CHLORHEXIDINE GLUCONATE 4 % EX LIQD
60.0000 mL | Freq: Once | CUTANEOUS | Status: DC
  Filled 2016-02-15: qty 60

## 2016-02-15 MED ORDER — SODIUM CHLORIDE 0.9% FLUSH: 3.0000 mL | Freq: Two times a day (BID) | INTRAVENOUS | Status: DC

## 2016-02-15 MED ORDER — ACETAMINOPHEN 325 MG PO TABS
325.0000 mg | ORAL_TABLET | ORAL | Status: DC | PRN
Start: 2016-02-15 — End: 2016-02-15

## 2016-02-15 MED ORDER — HEPARIN (PORCINE) IN NACL 2-0.9 UNIT/ML-% IJ SOLN
INTRAMUSCULAR | Status: DC | PRN
  Administered 2016-02-15: 12:00:00

## 2016-02-15 MED ORDER — SODIUM CHLORIDE 0.9 % IR SOLN
80.0000 mg | Status: AC
  Administered 2016-02-15: 80 mg
  Filled 2016-02-15: qty 2

## 2016-02-15 MED ORDER — METOPROLOL TARTRATE 25 MG/10 ML ORAL SUSPENSION
25.0000 mg | Freq: Two times a day (BID) | ORAL | Status: DC
  Administered 2016-02-15 – 2016-02-16 (×3): 25 mg via ORAL
  Filled 2016-02-15 (×4): qty 10

## 2016-02-15 MED ORDER — SODIUM CHLORIDE 0.9% FLUSH: 3.0000 mL | INTRAVENOUS | Status: DC | PRN

## 2016-02-15 SURGICAL SUPPLY — 7 items
CABLE SURGICAL S-101-97-12 (CABLE) ×1 IMPLANT
LEAD TENDRIL MRI 52CM LPA1200M (Lead) ×1 IMPLANT
LEAD TENDRIL MRI 58CM LPA1200M (Lead) ×1 IMPLANT
PACEMAKER ASSURITY DR-RF (Pacemaker) ×1 IMPLANT
PAD DEFIB LIFELINK (PAD) ×1 IMPLANT
SHEATH CLASSIC 8F (SHEATH) ×2 IMPLANT
TRAY PACEMAKER INSERTION (PACKS) ×1 IMPLANT

## 2016-02-15 NOTE — Care Management Note (Signed)
Case Management Note  Patient Details  Name: Donald [name] MRN: [id number] Date of Birth: [birth year]  Subjective/Objective:          A.fib with IV Cardizem Drip          Action/Plan: home3  Expected Discharge Date:                  Expected Discharge Plan:  Home/Self Care  In-House Referral:     Discharge planning Services     Post Acute Care Choice:    Choice offered to:     DME Arranged:    DME Agency:     HH Arranged:    HH Agency:     Status of Service:  In process, will continue to follow  If discussed at Long Length of Stay Meetings, dates discussed:    Additional Comments:Date:  February 15, 2016 Chart reviewed for concurrent status and case management needs. Will continue to follow the patient for status change: Discharge Planning: following for needs Expected discharge date: [id number] Velva Harman, BSN, [place], [place]  Leeroy Cha, RN 02/15/2016, 10:10 AM

## 2016-02-15 NOTE — Progress Notes (Signed)
Orthopedic Tech Progress Note Patient Details:  Donald [name] 12/01/1932 [id number]  Ortho Devices Type of Ortho Device: Arm sling   [name] 02/15/2016, 2:35 PM

## 2016-02-15 NOTE — Progress Notes (Signed)
Pharmacy called and asked about resuming Eliquis tonight. The pt did have some chest pain post pacemaker, echo done and is pending. I have instructed pharmacy to hold Eliquis this PM- will need to review review timing of Eliquis restart in AM.  Kerin Ransom PA-C 02/15/2016 5:08 PM

## 2016-02-15 NOTE — Progress Notes (Signed)
  Echocardiogram 2D Echocardiogram has been performed.  Donald [name] 02/15/2016, 2:34 PM

## 2016-02-15 NOTE — Progress Notes (Signed)
PROGRESS NOTE    Donald [name]  [id number] DOB: 1933/04/24 DOA: 02/14/2016 PCP: [name], MD    Brief Narrative:  82 y.o. male with a history of atrial fibrillation/atrial flutter s/p ablation, cardiomyopathy, CAD, and other medical comorbidities detailed below who presents to the Emergency Department complaining of chest pain and an irregular HR beginning tonight. He reports SOB stating he "feels like he's been running"and addsthat he measured his heart rate at home and it was fluctuating between 40 and 180. Pt reports taking nitroglycerin with some relief. No additional injury or complaint.   Assessment & Plan:   Principal Problem:   Atrial fibrillation with RVR (HCC) Active Problems:   Essential hypertension   Sinus bradycardia   Hyperlipidemia with target LDL less than 100   OSA (obstructive sleep apnea)   Habitual alcohol use   PAF (paroxysmal atrial fibrillation) (HCC)   Hearing loss   History of GI bleed   Anticoagulant long-term use   Troponin level elevated  1. Atrial fibrillation with RVR - Pt was admitted to SDU continued on diltiazem drip.   Cardizem drip has since been discontinued. Discussed case with cardiology who plans to transfer to [facility] for pacemaker placement 2. Essential Hypertension - stable at present. 3. OSA - CPAP continued 4. Cardiomyopathy - ordered 2 D echocardiogram, remains pending.   5. CAD - chest pain-free currently. Patient to continue when necessary nitroglycerin. Cardiology following, consideration for heart cath?.   troponin mildly elevated, stable at 0.08 and reviewed 6. GERD - will continue protonix     DVT prophylaxis: Eliquis Code Status: Full code Family Communication: Patient room, family not at bedside Disposition Plan: Plan transfer [facility], cardiology service  Consultants:   Cardiology  Procedures:     Antimicrobials: Anti-infectives    Start     Dose/Rate Route Frequency Ordered Stop   02/15/16  1730  ceFAZolin (ANCEF) IVPB 1 g/50 mL premix     1 g 100 mL/hr over 30 Minutes Intravenous Every 6 hours 02/15/16 1327 02/16/16 1129   02/15/16 1100  gentamicin (GARAMYCIN) 80 mg in sodium chloride irrigation 0.9 % 500 mL irrigation     80 mg Irrigation On call 02/15/16 1051 02/15/16 1228   02/15/16 1100  ceFAZolin (ANCEF) IVPB 2g/100 mL premix     2 g 200 mL/hr over 30 Minutes Intravenous On call 02/15/16 1051 02/15/16 1150       Subjective: No chest pain currently  Objective: Vitals:   02/15/16 1242 02/15/16 1247 02/15/16 1248 02/15/16 1400  BP:    (!) 147/83  Pulse: 60 (!) 186  64  Resp: 14 16  18   Temp:    97.8 F (36.6 C)  TempSrc:    Oral  SpO2: 97% 96% 95% 98%  Weight:    93.4 kg (205 lb 14.4 oz)  Height:    5\' 9"  (1.753 m)    Intake/Output Summary (Last 24 hours) at 02/15/16 1723 Last data filed at 02/15/16 1523  Gross per 24 hour  Intake              444 ml  Output              845 ml  Net             -401 ml   Filed Weights   02/14/16 0350 02/14/16 0622 02/15/16 1400  Weight: 94.3 kg (208 lb) 94.8 kg (208 lb 15.9 oz) 93.4 kg (205 lb 14.4 oz)  Examination:  General exam: Appears calm and comfortable  Respiratory system: Clear to auscultation. Respiratory effort normal. Cardiovascular system: S1 & S2 heard, RRR. No JVD, murmurs, rubs, gallops or clicks. No pedal edema. Gastrointestinal system: Abdomen is nondistended, soft and nontender. No organomegaly or masses felt. Normal bowel sounds heard. Central nervous system: Alert and oriented. No focal neurological deficits. Extremities: Symmetric 5 x 5 power. Skin: No rashes, lesions or ulcers Psychiatry: Judgement and insight appear normal. Mood & affect appropriate.   Data Reviewed: I have personally reviewed following labs and imaging studies  CBC:  Recent Labs Lab 02/14/16 0348 02/15/16 0314  WBC 6.9 9.5  HGB 13.7 12.5*  HCT 40.6 36.9*  MCV 96.7 96.9  PLT 190 0000000   Basic Metabolic  Panel:  Recent Labs Lab 02/14/16 0348 02/14/16 0753 02/15/16 0314  NA 140  --  141  K 4.3  --  4.6  CL 107  --  108  CO2 24  --  27  GLUCOSE 106*  --  106*  BUN 22*  --  21*  CREATININE 1.03  --  0.97  CALCIUM 9.3  --  8.9  MG  --  2.0  --   PHOS  --  2.7  --    GFR: Estimated Creatinine Clearance: 67.4 mL/min (by C-G formula based on SCr of 0.97 mg/dL). Liver Function Tests:  Recent Labs Lab 02/15/16 0314  AST 15  ALT 16*  ALKPHOS 62  BILITOT 0.9  PROT 6.4*  ALBUMIN 3.6   No results for input(s): LIPASE, AMYLASE in the last 168 hours. No results for input(s): AMMONIA in the last 168 hours. Coagulation Profile: No results for input(s): INR, PROTIME in the last 168 hours. Cardiac Enzymes:  Recent Labs Lab 02/14/16 0753 02/14/16 1347 02/14/16 1934  TROPONINI 0.06* 0.08* 0.08*   BNP (last 3 results) No results for input(s): PROBNP in the last 8760 hours. HbA1C: No results for input(s): HGBA1C in the last 72 hours. CBG: No results for input(s): GLUCAP in the last 168 hours. Lipid Profile: No results for input(s): CHOL, HDL, LDLCALC, TRIG, CHOLHDL, LDLDIRECT in the last 72 hours. Thyroid Function Tests:  Recent Labs  02/14/16 0753  TSH 0.532   Anemia Panel: No results for input(s): VITAMINB12, FOLATE, FERRITIN, TIBC, IRON, RETICCTPCT in the last 72 hours. Sepsis Labs: No results for input(s): PROCALCITON, LATICACIDVEN in the last 168 hours.  Recent Results (from the past 240 hour(s))  MRSA PCR Screening     Status: None   Collection Time: 02/14/16  5:52 AM  Result Value Ref Range Status   MRSA by PCR NEGATIVE NEGATIVE Final    Comment:        The GeneXpert MRSA Assay (FDA approved for NASAL specimens only), is one component of a comprehensive MRSA colonization surveillance program. It is not intended to diagnose MRSA infection nor to guide or monitor treatment for MRSA infections.      Radiology Studies: Dg Chest 2 View  Result Date:  02/14/2016 CLINICAL DATA:  Chest pain beginning tonight. Irregular heart rates. Former smoker. EXAM: CHEST  2 VIEW COMPARISON:  04/21/2015 FINDINGS: Emphysematous changes in the lungs. Normal heart size and pulmonary vascularity. No focal airspace disease or consolidation in the lungs. No blunting of costophrenic angles. No pneumothorax. Mediastinal contours appear intact. Degenerative changes in the spine and shoulders. Calcified and tortuous aorta. IMPRESSION: Emphysematous changes in the lungs. No evidence of active pulmonary disease. Electronically Signed   By: Oren Beckmann.D.  On: 02/14/2016 04:20    Scheduled Meds: . atorvastatin  40 mg Oral Daily  .  ceFAZolin (ANCEF) IV  1 g Intravenous Q6H  . ferrous sulfate  325 mg Oral Q breakfast  . furosemide  40 mg Oral Daily  . isosorbide mononitrate  30 mg Oral Daily  . losartan  100 mg Oral QPM  . magnesium oxide  200 mg Oral BID  . metoprolol  5 mg Intravenous Once  . metoprolol tartrate  25 mg Oral BID  . pantoprazole  40 mg Oral Daily  . sodium chloride flush  3 mL Intravenous Q12H   Continuous Infusions:    LOS: 1 day   CHIU, Orpah Melter, MD [facility] Pager [phone]  If 7PM-7AM, please contact night-coverage [email or web address] Password [facility] 02/15/2016, 5:23 PM

## 2016-02-15 NOTE — Progress Notes (Signed)
Subjective:  No chest pain. He has converted to NSR, SB  Objective:  Vital Signs in the last 24 hours: Temp:  [97.4 F (36.3 C)-99.1 F (37.3 C)] 97.4 F (36.3 C) (10/02 0400) Pulse Rate:  [45-135] 54 (10/02 0700) Resp:  [4-24] 21 (10/02 0700) BP: (87-143)/(26-88) 143/88 (10/02 0700) SpO2:  [88 %-98 %] 97 % (10/02 0700)  Intake/Output from previous day:  Intake/Output Summary (Last 24 hours) at 02/15/16 0821 Last data filed at 02/14/16 2321  Gross per 24 hour  Intake            31.45 ml  Output              875 ml  Net          -843.55 ml    Physical Exam: General appearance: alert, cooperative and no distress Lungs: bilateral velcro crackles Heart: regular rate and rhythm and soft systolic murmur Extremities: extremities normal, atraumatic, no cyanosis or edema Neurologic: Grossly normal   Rate: 50  Rhythm: normal sinus rhythm and sinus bradycardia  Lab Results:  Recent Labs  02/14/16 0348 02/15/16 0314  WBC 6.9 9.5  HGB 13.7 12.5*  PLT 190 175    Recent Labs  02/14/16 0348 02/15/16 0314  NA 140 141  K 4.3 4.6  CL 107 108  CO2 24 27  GLUCOSE 106* 106*  BUN 22* 21*  CREATININE 1.03 0.97    Recent Labs  02/14/16 1347 02/14/16 1934  TROPONINI 0.08* 0.08*   No results for input(s): INR in the last 72 hours.  Scheduled Meds: . atorvastatin  40 mg Oral Daily  . ferrous sulfate  325 mg Oral Q breakfast  . furosemide  40 mg Oral Daily  . isosorbide mononitrate  30 mg Oral Daily  . losartan  100 mg Oral QPM  . magnesium oxide  200 mg Oral BID  . metoprolol  5 mg Intravenous Once  . pantoprazole  40 mg Oral Daily  . sodium chloride flush  3 mL Intravenous Q12H   Continuous Infusions: . diltiazem (CARDIZEM) infusion Stopped (02/14/16 1054)   PRN Meds:.acetaminophen **OR** acetaminophen, nitroGLYCERIN   Imaging: Dg Chest 2 View  Result Date: 02/14/2016 CLINICAL DATA:  Chest pain beginning tonight. Irregular heart rates. Former smoker.  EXAM: CHEST  2 VIEW COMPARISON:  04/21/2015 FINDINGS: Emphysematous changes in the lungs. Normal heart size and pulmonary vascularity. No focal airspace disease or consolidation in the lungs. No blunting of costophrenic angles. No pneumothorax. Mediastinal contours appear intact. Degenerative changes in the spine and shoulders. Calcified and tortuous aorta. IMPRESSION: Emphysematous changes in the lungs. No evidence of active pulmonary disease. Electronically Signed   By: Lucienne Capers M.D.   On: 02/14/2016 04:20     Assessment/Plan:  82 y.o. male followed by Dr Caryl Comes with a PMHx of PAF/SSS ( on Eliquis ) , tachycardia mediated cardiomyopathy-last EF 55-60% by echo Dec 2016, functionally bicuspid AV, chronic diastolic CHF, HTN, HLD, COPD, and past abnormal Myoview June 2016.   He had chest pain in June 2016- Myoview suggested CFX ischemia and cath set up but pt was found to be anemic. This was treated and his symptoms improved so medical Rx was continued. Adm now with AF with RVR which converted with Diltiazem and Lopressor but with significant pauses. He admits he had SSCP-"pressure" but that was in the setting of tachycardia. Troponin elevation could be attributed to tachycardia.   Principal Problem:   Atrial fibrillation with RVR (HCC) Active  Problems:   Essential hypertension   Sinus bradycardia   Hyperlipidemia with target LDL less than 100   OSA (obstructive sleep apnea)   Habitual alcohol use   PAF (paroxysmal atrial fibrillation) (HCC)   Hearing loss   History of GI bleed   Anticoagulant long-term use   Troponin level elevated   PLAN: Pt placed on schedule for a pacemaker by Dr Acie Fredrickson. Question of cath was brought up but the pt received Eliquis yesterday AM.   Pt for pacemaker this am. Discussed with Dr Wyline Copas, will keep at [facility] on cardiology service after pacemaker. I did hold Diltiazem 240 mg PO this am as his HR is 55. I also held Eliquis this am till question of cath can be  sorted out, if no cath resume Eliquis this pm.   Kerin Ransom PA-C 02/15/2016, 8:21 AM [phone]  EP Attending  Patient seen and examined. Agree with the findings as noted above. He has symptomatic sinus node dysfunction and PAF with a RVR. I have discussed the treatment options with the patient. The risks/benefits/goals/expectations of the procedure were discussed and he wishes to proceed.  Mikle Bosworth.D.

## 2016-02-15 NOTE — Progress Notes (Signed)
Patient ID: Donald [name], male   DOB: [birth year], 82 y.o.   MRN: [id number]  EP Attending  Called to see patient for chest pain after PPM. His vitals are stable and his pain has resolved and is not pleuritic. Will start beta blocker. Will follow. A 2D echo has been ordered. No rub on exam. He looks and feels well.  Mikle Bosworth.D.

## 2016-02-15 NOTE — Discharge Instructions (Signed)
° ° °  Supplemental Discharge Instructions for  Pacemaker/Defibrillator Patients  Activity No heavy lifting or vigorous activity with your left/right arm for 6 to 8 weeks.  Do not raise your left/right arm above your head for one week.  Gradually raise your affected arm as drawn below.             02/19/16                     02/20/16                     02/21/16                  02/22/16 __  NO DRIVING for 1 week    ; you may begin driving on  [id number]    .  WOUND CARE - Keep the wound area clean and dry.  Do not get this area wet for one week. No showers for one week; you may shower on 02/22/16     . - The tape/steri-strips on your wound will fall off; do not pull them off.  No bandage is needed on the site.  DO  NOT apply any creams, oils, or ointments to the wound area. - If you notice any drainage or discharge from the wound, any swelling or bruising at the site, or you develop a fever > 101? F after you are discharged home, call the office at once.  Special Instructions - You are still able to use cellular telephones; use the ear opposite the side where you have your pacemaker/defibrillator.  Avoid carrying your cellular phone near your device. - When traveling through airports, show security personnel your identification card to avoid being screened in the metal detectors.  Ask the security personnel to use the hand wand. - Avoid arc welding equipment, MRI testing (magnetic resonance imaging), TENS units (transcutaneous nerve stimulators).  Call the office for questions about other devices. - Avoid electrical appliances that are in poor condition or are not properly grounded. - Microwave ovens are safe to be near or to operate.  Additional information for defibrillator patients should your device go off: - If your device goes off ONCE and you feel fine afterward, notify the device clinic nurses. - If your device goes off ONCE and you do not feel well afterward, call 911. - If your device  goes off TWICE, call 911. - If your device goes off THREE times in one day, call 911.  DO NOT DRIVE YOURSELF OR A FAMILY MEMBER WITH A DEFIBRILLATOR TO THE HOSPITAL--CALL 911.

## 2016-02-16 ENCOUNTER — Inpatient Hospital Stay (HOSPITAL_COMMUNITY): Payer: PPO

## 2016-02-16 DIAGNOSIS — I429 Cardiomyopathy, unspecified: Secondary | ICD-10-CM | POA: Diagnosis not present

## 2016-02-16 DIAGNOSIS — I495 Sick sinus syndrome: Secondary | ICD-10-CM | POA: Diagnosis not present

## 2016-02-16 DIAGNOSIS — I110 Hypertensive heart disease with heart failure: Secondary | ICD-10-CM | POA: Diagnosis not present

## 2016-02-16 DIAGNOSIS — I4892 Unspecified atrial flutter: Secondary | ICD-10-CM | POA: Diagnosis not present

## 2016-02-16 DIAGNOSIS — Z950 Presence of cardiac pacemaker: Secondary | ICD-10-CM | POA: Diagnosis not present

## 2016-02-16 DIAGNOSIS — F17220 Nicotine dependence, chewing tobacco, uncomplicated: Secondary | ICD-10-CM | POA: Diagnosis not present

## 2016-02-16 DIAGNOSIS — Q231 Congenital insufficiency of aortic valve: Secondary | ICD-10-CM | POA: Diagnosis not present

## 2016-02-16 DIAGNOSIS — I5032 Chronic diastolic (congestive) heart failure: Secondary | ICD-10-CM | POA: Diagnosis not present

## 2016-02-16 DIAGNOSIS — I2510 Atherosclerotic heart disease of native coronary artery without angina pectoris: Secondary | ICD-10-CM | POA: Diagnosis not present

## 2016-02-16 DIAGNOSIS — I480 Paroxysmal atrial fibrillation: Secondary | ICD-10-CM | POA: Diagnosis not present

## 2016-02-16 DIAGNOSIS — Z85828 Personal history of other malignant neoplasm of skin: Secondary | ICD-10-CM | POA: Diagnosis not present

## 2016-02-16 DIAGNOSIS — K219 Gastro-esophageal reflux disease without esophagitis: Secondary | ICD-10-CM | POA: Diagnosis not present

## 2016-02-16 DIAGNOSIS — Z9889 Other specified postprocedural states: Secondary | ICD-10-CM | POA: Diagnosis not present

## 2016-02-16 DIAGNOSIS — G4733 Obstructive sleep apnea (adult) (pediatric): Secondary | ICD-10-CM | POA: Diagnosis not present

## 2016-02-16 MED ORDER — METOPROLOL TARTRATE 50 MG PO TABS: 50.0000 mg | ORAL_TABLET | Freq: Two times a day (BID) | ORAL | 3 refills | Status: DC

## 2016-02-16 MED FILL — Heparin Sodium (Porcine) 2 Unit/ML in Sodium Chloride 0.9%: INTRAMUSCULAR | Qty: 500 | Status: AC

## 2016-02-16 NOTE — Progress Notes (Addendum)
SUBJECTIVE: The patient is doing well today.  At this time, he denies chest pain, shortness of breath, or any new concerns.  Marland Kitchen atorvastatin  40 mg Oral Daily  . ferrous sulfate  325 mg Oral Q breakfast  . furosemide  40 mg Oral Daily  . isosorbide mononitrate  30 mg Oral Daily  . losartan  100 mg Oral QPM  . magnesium oxide  200 mg Oral BID  . metoprolol  5 mg Intravenous Once  . metoprolol tartrate  25 mg Oral BID  . pantoprazole  40 mg Oral Daily  . sodium chloride flush  3 mL Intravenous Q12H      OBJECTIVE: Physical Exam: Vitals:   02/15/16 1400 02/15/16 2045 02/15/16 2324 02/16/16 0639  BP: (!) 147/83 128/71 (!) 146/84 132/74  Pulse: 64 64  64  Resp: 18 17  18   Temp: 97.8 F (36.6 C) 98.5 F (36.9 C)  97.9 F (36.6 C)  TempSrc: Oral Oral  Oral  SpO2: 98% 97%  97%  Weight: 205 lb 14.4 oz (93.4 kg)   204 lb 12.8 oz (92.9 kg)  Height: 5\' 9"  (1.753 m)       Intake/Output Summary (Last 24 hours) at 02/16/16 0851 Last data filed at 02/16/16 [id number]  Gross per 24 hour  Intake             1074 ml  Output              900 ml  Net              174 ml    Telemetry reveals SR, occ APaced  GEN- The patient is well appearing, alert and oriented x 3 today.   Head- normocephalic, atraumatic Eyes-  Sclera clear, conjunctiva pink Ears- hearing intact Oropharynx- clear Neck- supple, no JVP Lungs- Clear to ausculation bilaterally, normal work of breathing Heart- Regular rate and rhythm, no significant murmurs, no rubs or gallops GI- soft, NT, ND Extremities- no clubbing, cyanosis, or edema Skin- no rash or lesion Psych- euthymic mood, full affect Neuro- no gross deficits appreciated  PPM implant site is dry, no hematoma  LABS: Basic Metabolic Panel:  Recent Labs  02/14/16 0348 02/14/16 0753 02/15/16 0314  NA 140  --  141  K 4.3  --  4.6  CL 107  --  108  CO2 24  --  27  GLUCOSE 106*  --  106*  BUN 22*  --  21*  CREATININE 1.03  --  0.97  CALCIUM 9.3  --   8.9  MG  --  2.0  --   PHOS  --  2.7  --    Liver Function Tests:  Recent Labs  02/15/16 0314  AST 15  ALT 16*  ALKPHOS 62  BILITOT 0.9  PROT 6.4*  ALBUMIN 3.6   CBC:  Recent Labs  02/14/16 0348 02/15/16 0314  WBC 6.9 9.5  HGB 13.7 12.5*  HCT 40.6 36.9*  MCV 96.7 96.9  PLT 190 175   Cardiac Enzymes:  Recent Labs  02/14/16 0753 02/14/16 1347 02/14/16 1934  TROPONINI 0.06* 0.08* 0.08*    Recent Labs  02/14/16 0753  TSH 0.532    RADIOLOGY: Dg Chest 2 View Result Date: 02/16/2016 CLINICAL DATA:  82 year old male with pacemaker. Subsequent encounter. EXAM: CHEST  2 VIEW COMPARISON:  02/14/2016 and 05/07/2013 chest x-ray. FINDINGS: Sequential pacemaker has been placed from left. Leads are in the region of the right atrium and right  ventricle. No pneumothorax. Heart size top-normal. Chronic lung changes similar to prior exam. Calcified minimally tortuous aorta. Right carotid bifurcation calcifications. Mild thoracic kyphosis without focal compression deformity. IMPRESSION: Sequential pacemaker placed with leads in the region of the right atrium and right ventricle. No pneumothorax. Remainder of findings unchanged. Electronically Signed   By: Genia Del M.D.   On: 02/16/2016 07:23   02/15/16: TTE Study Conclusions - Left ventricle: The cavity size was normal. There was mild   concentric hypertrophy. Systolic function was normal. The   estimated ejection fraction was in the range of 60% to 65%. Wall   motion was normal; there were no regional wall motion   abnormalities. Doppler parameters are consistent with abnormal   left ventricular relaxation (grade 1 diastolic dysfunction). - Aortic valve: Valve mobility was restricted. Transvalvular   velocity was within the normal range. There was no stenosis.   There was moderate regurgitation. - Mitral valve: Calcified annulus. Transvalvular velocity was   within the normal range. There was no evidence for stenosis.    There was no regurgitation. - Right ventricle: The cavity size was normal. Wall thickness was   normal. Systolic function was normal. - Tricuspid valve: There was mild regurgitation. - Pulmonary arteries: Systolic pressure was within the normal   range. PA peak pressure: 28 mm Hg (S).  ASSESSMENT AND PLAN:   1. CP     CAD     Not an ongoing c/o and associated with RAFib, atypical, no exertional CP or exertional intolerances     Minimal Trop elevation, likely secondary to demand ischemia     No further w/u planned     F/u with Dr. Acie Fredrickson out patient  2. PAFib, tachy-brady syndrome     S/p PPM implant yesterday with Dr. Lovena Le     Site is stable     Device check this morning with intact/normal function     cxr this morning is without ptx     Wound care and activity instructions reviewed with the patient     F/u has been arranged Metoprolol resumed at 25mg  BID, to be increased to 50mg  BID after 1 week CHA2DS2Vasc is at least 3, (4 with hx of CM that has resolved) Resume Eliquis this evening  3. HTN     stable   EP remains available, please recall if needed.   Tommye Standard, PA-C 02/16/2016 8:51 AM  EP Attending  Patient seen and examined. Agree with above. His device is working normally and cxr is ok. He is stable for DC from my perspective. We will arrange followup. Ongoing uptitration of beta blocker to 50 mg bid over the next week. If he develops exertional chest pain, or pain with rapid atrial fib, then we will recommend left heart catheterization.   Mikle Bosworth.D.

## 2016-02-16 NOTE — Progress Notes (Signed)
Patient alert and oriented, denies pain, with a restful uneventful night.

## 2016-02-16 NOTE — Consult Note (Signed)
   [facility] CM Inpatient Consult   02/16/2016  COE ANGELOS 04-25-1933 [id number]   Patient screened for potential [facility] services. Patient is eligible for [facility] Care Management services under patient's [employer] Medicare plan.  Chart review reveals the patient is an 82 y.o.malewith a history of atrial fibrillation/atrial flutter s/p ablation, cardiomyopathy, CAD, and other medical comorbidities detailed below who presents to the Emergency Department complaining of chest pain and an irregular HR. He reports SOB stating he "feels like he's been running"and addsthat he measured his heart rate at home and it was fluctuating between 40 and 180. Pt reports taking nitroglycerin with some relief.  Patient underwent a Pacemaker insertion.  Met with the patient and wife regarding [facility] Care Management benefits.  They felt that they had no post hospital needs for follow up since they have their appointments and follow up. No other needs identified.  Patient denies medication needs and  no transportation issues   For questions contact:   Natividad Brood, RN BSN [facility] Liaison  [phone] business mobile phone Toll free office [phone]

## 2016-02-16 NOTE — Discharge Summary (Signed)
ELECTROPHYSIOLOGY PROCEDURE DISCHARGE SUMMARY    Patient ID: Donald [name],  MRN: [id number], DOB/AGE: 10-13-1932 82 y.o.  Admit date: 02/14/2016 Discharge date: 02/16/2016  Primary Care Physician: [name], MD  Primary Cardiologist/Electrophysiologist: Dr. Caryl Comes   Primary Discharge Diagnosis:  1. CP 2. PAFib     CHA2DS2Vasc is at least 3 on Eliquis 3. Tachy-brady syndrome      S/p PPM implant this admission  Secondary Discharge Diagnosis:  1. HTN 2. VHD     Bicuspid AV      Not described on current echo, no AS, mod AI  No Known Allergies   Procedures This Admission:  1.  Implantation of a STJ dual chamber PPM on 02/15/16 by Dr Lovena Le.  The patient received aSt. Jude (serial number [id number]) pacemaker with St.Jude (serial number [id number] right atrial lead and a St. Jude (serial number [id number]) right ventricular lead There were no immediate post procedure complications. 2.  CXR on 02/16/16 demonstrated no pneumothorax status post device implantation.   Brief HPI: [name] is a 82 y.o. male was initially admitted to [facility] with c/o CP, noted to be in RAFib.   Hospital Course:  The patientwith a PMHx of PAF ( on Eliquis ) , tachycardia mediated cardiomyopathy, functionally bicuspid AV, chronic diastolic CHF, HTN, HLD  , who was admitted to [facility] on 02/14/2016 for evaluation of CP and atrial fib.  Historically he had a myoview in June, 2016 that showed mild ischemia in the lateral well.   Was set up for cath but then was found to have a Hb of 7. The cath was cancelled. 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 He presented now as noted with c/ocCP in the setting of RAFib, treated with IV BB and diltiazem he converted to NSR and has been having some pauses 3-6 seconds.  Felt to have tachy-brady syndrome, was transferred to [facility] to undergo PPM implant.  He did have mild trop elevation to 0.08, though this felt to be secondary to RAFib, given  atypical CP c/o, Dr. Lovena Le did not feel further evaluation with cath was warranted at this time.  He did relate to an RN post implant of some CP, though this apparently was very fleeting, an echo was obtained with no description of pericardial effusion.   He underwent implantation of a PPM with details as outlined above.  He was monitored on telemetry overnight which demonstrated A pacing, V sensing.  Left chest was without hematoma or ecchymosis.  The device was interrogated and found to be functioning normally.  CXR was obtained and demonstrated no pneumothorax status post device implantation.  Wound care, arm mobility, and restrictions were reviewed with the patient.  The patient remained CP free, has ambulated without difficulty or symptoms, was examined by Dr. Lovena Le and considered stable for discharge to home.  If he has recurrent CP outside of RAFib, would revisit cardiac cath.  He was started on Metoprolol with plans to up-titrate this after 1 week.  He will resume his Eliquis tomorrow AM rather then tonight.   Physical Exam: Vitals:   02/15/16 1400 02/15/16 2045 02/15/16 2324 02/16/16 0639  BP: (!) 147/83 128/71 (!) 146/84 132/74  Pulse: 64 64  64  Resp: 18 17  18   Temp: 97.8 F (36.6 C) 98.5 F (36.9 C)  97.9 F (36.6 C)  TempSrc: Oral Oral  Oral  SpO2: 98% 97%  97%  Weight: 205 lb 14.4  oz (93.4 kg)   204 lb 12.8 oz (92.9 kg)  Height: 5\' 9"  (1.753 m)        Labs:   Lab Results  Component Value Date   WBC 9.5 02/15/2016   HGB 12.5 (L) 02/15/2016   HCT 36.9 (L) 02/15/2016   MCV 96.9 02/15/2016   PLT 175 02/15/2016     Recent Labs Lab 02/15/16 0314  NA 141  K 4.6  CL 108  CO2 27  BUN 21*  CREATININE 0.97  CALCIUM 8.9  PROT 6.4*  BILITOT 0.9  ALKPHOS 62  ALT 16*  AST 15  GLUCOSE 106*    Discharge Medications:    Medication List    STOP taking these medications   diltiazem 240 MG 24 hr capsule Commonly known as:  DILACOR XR     TAKE these medications    apixaban 5 MG Tabs tablet Commonly known as:  ELIQUIS Take 1 tablet (5 mg total) by mouth 2 (two) times daily. Notes to patient:  Do not resume until Wed. 02/17/16.   atorvastatin 40 MG tablet Commonly known as:  LIPITOR Take 1 tablet (40 mg total) by mouth daily. Yearly physical w/labs are due must see MD for future refills   EYE VITAMINS PO Take 1 tablet by mouth 2 (two) times daily. Macular Supplement   ferrous sulfate 325 (65 FE) MG tablet Take 1 tablet (325 mg total) by mouth daily with breakfast.   fluticasone 50 MCG/ACT nasal spray Commonly known as:  FLONASE USE 1 SPRAY EACH NOSTRIL ONCE DAILY.    SHAKE GENTLY  What changed:  See the new instructions. Notes to patient:  Continue as you have been instructed by the prescribing doctor   furosemide 40 MG tablet Commonly known as:  LASIX TAKE 1 TABLET EACH DAY.   isosorbide mononitrate 30 MG 24 hr tablet Commonly known as:  IMDUR Take 1 tablet (30 mg total) by mouth daily.   loratadine 10 MG tablet Commonly known as:  CLARITIN Take 10 mg by mouth daily as needed for allergies.   losartan 100 MG tablet Commonly known as:  COZAAR TAKE 1 TABLET DAILY. What changed:  See the new instructions.   MAGNESIUM PO Take 4 tablets by mouth 2 (two) times daily.   metoprolol 50 MG tablet Commonly known as:  LOPRESSOR Take 1 tablet (50 mg total) by mouth 2 (two) times daily. To start, take 1/2 tab (25mg ) by mouth twice daily for one week, then increase to 1 tab (50mg ) by mouth twice daily Notes to patient:  Start by taking 1/2 tablet (25mg ) twice daily for one week, then increase to one tablet (50mg ) twice daily   nitroGLYCERIN 0.4 MG SL tablet Commonly known as:  NITROSTAT Place 1 tablet (0.4 mg total) under the tongue every 5 (five) minutes as needed for chest pain.   NON FORMULARY c pap   pantoprazole 40 MG tablet Commonly known as:  PROTONIX TAKE 1 TABLET EACH DAY.       Disposition: Home Discharge Instructions      Diet - low sodium heart healthy    Complete by:  As directed    Increase activity slowly    Complete by:  As directed      Follow-up Information    [place] [facility] Follow up on 02/25/2016.   Specialty:  Cardiology Why:  4:00PM, wound check Contact information: [address] [place]  Virl Axe, MD .   Specialty:  Cardiology Why:  you will be called to schedule a 3 month follow up appointment with Dr. Margurite Auerbach information: [id number] [id number] [phone]           Duration of Discharge Encounter: Greater than 30 minutes including physician time.  Venetia Night, PA-C 02/16/2016 4:09 PM  EP Attending  Patient seen and examined. Agree with above. See my note as well. He is stable for DC home. Usual followup.  Mikle Bosworth.D.

## 2016-02-22 ENCOUNTER — Other Ambulatory Visit (INDEPENDENT_AMBULATORY_CARE_PROVIDER_SITE_OTHER): Payer: PPO

## 2016-02-22 DIAGNOSIS — D509 Iron deficiency anemia, unspecified: Secondary | ICD-10-CM

## 2016-02-22 LAB — IBC PANEL
Iron: 107 ug/dL (ref 42–165)
Saturation Ratios: 29 % (ref 20.0–50.0)
Transferrin: 264 mg/dL (ref 212.0–360.0)

## 2016-02-22 LAB — FERRITIN: FERRITIN: 29 ng/mL (ref 22.0–322.0)

## 2016-02-25 ENCOUNTER — Ambulatory Visit (INDEPENDENT_AMBULATORY_CARE_PROVIDER_SITE_OTHER): Payer: PPO | Admitting: *Deleted

## 2016-02-25 DIAGNOSIS — R001 Bradycardia, unspecified: Secondary | ICD-10-CM

## 2016-02-25 DIAGNOSIS — I480 Paroxysmal atrial fibrillation: Secondary | ICD-10-CM

## 2016-02-25 DIAGNOSIS — Z950 Presence of cardiac pacemaker: Secondary | ICD-10-CM

## 2016-02-25 LAB — CUP PACEART INCLINIC DEVICE CHECK
Battery Remaining Longevity: 82.8
Battery Voltage: 3.05 V
Date Time Interrogation Session: 20171012172023
Implantable Lead Implant Date: 20171002
Implantable Lead Location: 753859
Lead Channel Impedance Value: 487.5 Ohm
Lead Channel Pacing Threshold Pulse Width: 0.5 ms
Lead Channel Pacing Threshold Pulse Width: 0.5 ms
Lead Channel Setting Pacing Amplitude: 0.875
Lead Channel Setting Pacing Amplitude: 3.5 V
MDC IDC LEAD IMPLANT DT: 20171002
MDC IDC LEAD LOCATION: 753860
MDC IDC MSMT LEADCHNL RA PACING THRESHOLD AMPLITUDE: 1 V
MDC IDC MSMT LEADCHNL RA SENSING INTR AMPL: 3.3 mV
MDC IDC MSMT LEADCHNL RV IMPEDANCE VALUE: 662.5 Ohm
MDC IDC MSMT LEADCHNL RV PACING THRESHOLD AMPLITUDE: 0.625 V
MDC IDC MSMT LEADCHNL RV SENSING INTR AMPL: 12 mV
MDC IDC PG SERIAL: 7951215
MDC IDC SET LEADCHNL RV PACING PULSEWIDTH: 0.5 ms
MDC IDC SET LEADCHNL RV SENSING SENSITIVITY: 2 mV
MDC IDC STAT BRADY RA PERCENT PACED: 86 %
MDC IDC STAT BRADY RV PERCENT PACED: 0.16 %
Pulse Gen Model: 2272

## 2016-02-25 NOTE — Progress Notes (Signed)
Wound check appointment. Steri-strips removed. Wound without redness or edema. Incision edges approximated, wound well healed. Normal device function. Thresholds, sensing, and impedances consistent with implant measurements. Device programmed at 3.5V with RV auto capture programmed on for extra safety margin until 3 month visit. Histogram distribution appropriate for patient and level of activity. 1 mode switch (<1%), 4 sec duration, EGM suggests brief competitive pacing due to [employer]. No high ventricular rates noted. Patient educated about wound care, arm mobility, lifting restrictions. ROV with SK on 05/23/16.  Eliquis samples given to patient per his request.

## 2016-03-07 ENCOUNTER — Ambulatory Visit (INDEPENDENT_AMBULATORY_CARE_PROVIDER_SITE_OTHER): Payer: PPO | Admitting: Internal Medicine

## 2016-03-07 ENCOUNTER — Other Ambulatory Visit (INDEPENDENT_AMBULATORY_CARE_PROVIDER_SITE_OTHER): Payer: PPO

## 2016-03-07 ENCOUNTER — Encounter: Payer: Self-pay | Admitting: Internal Medicine

## 2016-03-07 VITALS — BP 102/62 | HR 60 | Temp 98.1°F | Resp 16 | Ht 69.0 in | Wt 215.5 lb

## 2016-03-07 DIAGNOSIS — R10814 Left lower quadrant abdominal tenderness: Secondary | ICD-10-CM | POA: Insufficient documentation

## 2016-03-07 DIAGNOSIS — E669 Obesity, unspecified: Secondary | ICD-10-CM

## 2016-03-07 DIAGNOSIS — Z23 Encounter for immunization: Secondary | ICD-10-CM

## 2016-03-07 DIAGNOSIS — D539 Nutritional anemia, unspecified: Secondary | ICD-10-CM | POA: Insufficient documentation

## 2016-03-07 DIAGNOSIS — N281 Cyst of kidney, acquired: Secondary | ICD-10-CM

## 2016-03-07 DIAGNOSIS — I700 Atherosclerosis of aorta: Secondary | ICD-10-CM

## 2016-03-07 DIAGNOSIS — E785 Hyperlipidemia, unspecified: Secondary | ICD-10-CM | POA: Diagnosis not present

## 2016-03-07 LAB — LIPID PANEL
CHOLESTEROL: 140 mg/dL (ref 0–200)
HDL: 51.9 mg/dL (ref >39.00)
LDL Cholesterol: 70 mg/dL (ref 0–99)
NONHDL: 88.45
TRIGLYCERIDES: 90 mg/dL (ref 0.0–149.0)
Total CHOL/HDL Ratio: 3
VLDL: 18 mg/dL (ref 0.0–40.0)

## 2016-03-07 LAB — URINALYSIS, ROUTINE W REFLEX MICROSCOPIC
Bilirubin Urine: NEGATIVE
Hgb urine dipstick: NEGATIVE
Ketones, ur: NEGATIVE
Leukocytes, UA: NEGATIVE
Nitrite: NEGATIVE
PH: 6 (ref 5.0–8.0)
RBC / HPF: NONE SEEN (ref 0–?)
Total Protein, Urine: NEGATIVE
Urine Glucose: NEGATIVE
Urobilinogen, UA: 0.2 (ref 0.0–1.0)
WBC, UA: NONE SEEN (ref 0–?)

## 2016-03-07 LAB — CBC WITH DIFFERENTIAL/PLATELET
BASOS PCT: 0.3 % (ref 0.0–3.0)
Basophils Absolute: 0 10*3/uL (ref 0.0–0.1)
EOS ABS: 0.2 10*3/uL (ref 0.0–0.7)
Eosinophils Relative: 1.9 % (ref 0.0–5.0)
HEMATOCRIT: 38.4 % — AB (ref 39.0–52.0)
HEMOGLOBIN: 13.2 g/dL (ref 13.0–17.0)
LYMPHS PCT: 17.5 % (ref 12.0–46.0)
Lymphs Abs: 1.6 10*3/uL (ref 0.7–4.0)
MCHC: 34.3 g/dL (ref 30.0–36.0)
MCV: 96 fl (ref 78.0–100.0)
Monocytes Absolute: 0.5 10*3/uL (ref 0.1–1.0)
Monocytes Relative: 5.8 % (ref 3.0–12.0)
Neutro Abs: 6.9 10*3/uL (ref 1.4–7.7)
Neutrophils Relative %: 74.5 % (ref 43.0–77.0)
Platelets: 193 10*3/uL (ref 150.0–400.0)
RBC: 4 Mil/uL — AB (ref 4.22–5.81)
RDW: 14.3 % (ref 11.5–15.5)
WBC: 9.3 10*3/uL (ref 4.0–10.5)

## 2016-03-07 LAB — GAMMA GT: GGT: 18 U/L (ref 7–51)

## 2016-03-07 LAB — FOLATE: Folate: >23.4 ng/mL (ref >5.9)

## 2016-03-07 LAB — AMYLASE: AMYLASE: 40 U/L (ref 27–131)

## 2016-03-07 LAB — LIPASE: LIPASE: 31 U/L (ref 11.0–59.0)

## 2016-03-07 LAB — TSH: TSH: 0.56 u[IU]/mL (ref 0.35–4.50)

## 2016-03-07 LAB — C-REACTIVE PROTEIN: CRP: 0.3 mg/dL — ABNORMAL LOW (ref 0.5–20.0)

## 2016-03-07 LAB — VITAMIN B12: VITAMIN B 12: 580 pg/mL (ref 211–911)

## 2016-03-07 NOTE — Progress Notes (Signed)
Subjective:  Patient ID: Donald [name], male    DOB: Nov 22, 1932  Age: 82 y.o. MRN: [id number]  CC: Hyperlipidemia; Anemia; and Abdominal Pain   HPI Donald [name] presents for f/up. He complains of left-sided abdominal pain for about 3 weeks. He describes an achy, intermittently sharp, and intermittently bloating sensation in his left flank, his left mid abdomen and his left lower quadrant. He had an MRI done earlier this year that really revealed 2 cystic lesions on his left kidney. He also has a history of diverticulitis. He has his stable level of dyspnea on exertion and fatigue but denies any recent episodes of loss of appetite, nausea, vomiting, fever, chills, dysuria, hematuria, diarrhea, or constipation.    ROS Review of Systems  Constitutional: Positive for fatigue. Negative for activity change, appetite change, chills, diaphoresis, fever and unexpected weight change.  HENT: Negative.  Negative for facial swelling, sore throat and trouble swallowing.   Eyes: Negative.  Negative for visual disturbance.  Respiratory: Positive for shortness of breath. Negative for apnea, cough, choking, chest tightness, wheezing and stridor.   Cardiovascular: Negative for chest pain, palpitations and leg swelling.  Gastrointestinal: Positive for abdominal pain. Negative for blood in stool, constipation, diarrhea, nausea, rectal pain and vomiting.  Genitourinary: Positive for flank pain. Negative for difficulty urinating, dysuria, frequency, hematuria, penile swelling, scrotal swelling, testicular pain and urgency.  Skin: Negative.   Neurological: Negative.  Negative for dizziness, weakness, numbness and headaches.  Hematological: Negative.  Negative for adenopathy. Does not bruise/bleed easily.  Psychiatric/Behavioral: Negative.     Objective:  BP 102/62 (BP Location: Left Arm, Patient Position: Sitting, Cuff Size: Normal)   Pulse 60   Temp 98.1 F (36.7 C) (Oral)   Resp 16   Ht 5\' 9"  (1.753  m)   Wt 215 lb 8 oz (97.8 kg)   SpO2 96%   BMI 31.82 kg/m   BP Readings from Last 3 Encounters:  03/07/16 102/62  02/16/16 132/74  08/17/15 102/60    Wt Readings from Last 3 Encounters:  03/07/16 215 lb 8 oz (97.8 kg)  02/16/16 204 lb 12.8 oz (92.9 kg)  08/17/15 218 lb 12.8 oz (99.2 kg)    Physical Exam  Constitutional: He is oriented to person, place, and time.  Non-toxic appearance. He does not have a sickly appearance. He does not appear ill. No distress.  HENT:  Mouth/Throat: Oropharynx is clear and moist. No oropharyngeal exudate.  Eyes: Conjunctivae are normal. Right eye exhibits no discharge. Left eye exhibits no discharge. No scleral icterus.  Neck: Normal range of motion. Neck supple. No JVD present. No tracheal deviation present. No thyromegaly present.  Cardiovascular: Normal rate, regular rhythm and intact distal pulses.  Exam reveals no gallop and no friction rub.   No murmur heard. Pulmonary/Chest: Effort normal and breath sounds normal. No stridor. No respiratory distress. He has no wheezes. He has no rales. He exhibits no tenderness.  Abdominal: Soft. Normal appearance and bowel sounds are normal. He exhibits no shifting dullness, no distension, no pulsatile liver, no fluid wave, no abdominal bruit, no ascites, no pulsatile midline mass and no mass. There is no hepatosplenomegaly, splenomegaly or hepatomegaly. There is tenderness in the left lower quadrant. There is no rigidity, no rebound, no guarding, no CVA tenderness, no tenderness at McBurney's point and negative Murphy's sign. No hernia. Hernia confirmed negative in the ventral area, confirmed negative in the right inguinal area and confirmed negative in the left inguinal area.  Musculoskeletal:  Normal range of motion. He exhibits no edema, tenderness or deformity.  Lymphadenopathy:    He has no cervical adenopathy.  Neurological: He is oriented to person, place, and time.  Skin: Skin is warm and dry. No rash  noted. He is not diaphoretic. No erythema. No pallor.  Vitals reviewed.   Lab Results  Component Value Date   WBC 9.3 03/07/2016   HGB 13.2 03/07/2016   HCT 38.4 (L) 03/07/2016   PLT 193.0 03/07/2016   GLUCOSE 106 (H) 02/15/2016   CHOL 140 03/07/2016   TRIG 90.0 03/07/2016   HDL 51.90 03/07/2016   LDLDIRECT 138.8 04/18/2011   LDLCALC 70 03/07/2016   ALT 16 (L) 02/15/2016   AST 15 02/15/2016   NA 141 02/15/2016   K 4.6 02/15/2016   CL 108 02/15/2016   CREATININE 0.97 02/15/2016   BUN 21 (H) 02/15/2016   CO2 27 02/15/2016   TSH 0.56 03/07/2016   PSA 0.83 11/20/2013   INR 1.29 04/21/2015    Dg Chest 2 View  Result Date: 02/14/2016 CLINICAL DATA:  Chest pain beginning tonight. Irregular heart rates. Former smoker. EXAM: CHEST  2 VIEW COMPARISON:  04/21/2015 FINDINGS: Emphysematous changes in the lungs. Normal heart size and pulmonary vascularity. No focal airspace disease or consolidation in the lungs. No blunting of costophrenic angles. No pneumothorax. Mediastinal contours appear intact. Degenerative changes in the spine and shoulders. Calcified and tortuous aorta. IMPRESSION: Emphysematous changes in the lungs. No evidence of active pulmonary disease. Electronically Signed   By: Lucienne Capers M.D.   On: 02/14/2016 04:20    Assessment & Plan:   [name] was seen today for hyperlipidemia, anemia and abdominal pain.  Diagnoses and all orders for this visit:  Need for prophylactic vaccination and inoculation against influenza -     Flu vaccine HIGH DOSE PF (Fluzone High dose)  ATHEROSCLEROSIS, AORTIC- will continue to mitigate his risk factors with blood pressure control and statin therapy, -     Lipid panel; Future  Obesity (BMI 30.0-34.9)- he is working on his lifestyle modifications to lose weight.  Deficiency anemia- he has had no recent episodes of blood loss and his anemia has improved, his vitamin levels are normal and his word take count is normal indicating  normal bone marrow function. -     CBC with Differential/Platelet; Future -     Folate; Future -     Vitamin B12; Future -     Reticulocytes; Future  Hyperlipidemia with target LDL less than 100- he has achieved his LDL goal is doing well on the statin. -     Lipid panel; Future -     TSH; Future  Left lower quadrant abdominal tenderness without rebound tenderness- his labs are all normal, I will get a CT scan with contrast to screen for abscess, hemorrhage into one of the renal cysts, section in renal cyst, diverticulitis, mass. -     Lipase; Future -     Amylase; Future -     Urinalysis, Routine w reflex microscopic (not at [facility]); Future -     C-reactive protein; Future -     Gamma GT; Future -     Cancel: MR Abdomen W Wo Contrast; Future -     CT ABDOMEN PELVIS W CONTRAST; Future  Need for prophylactic vaccination against Streptococcus pneumoniae (pneumococcus) -     Pneumococcal polysaccharide vaccine 23-valent greater than or equal to 2yo subcutaneous/IM  Renal cyst, left- he has new and worsening symptoms so  I need to rescan his kidneys to see if there is been no malignant transformation or complications such as hemorrhage into one of the cysts, infection, or abscess. He cannot undergo an MRI because he has recently received a pacemaker so I will order a CT scan with contrast. -     Cancel: MR Abdomen W Wo Contrast; Future -     CT ABDOMEN PELVIS W CONTRAST; Future   I have discontinued Mr. Haar Multiple Vitamins-Minerals (EYE VITAMINS PO). I have also changed his fluticasone. Additionally, I am having him maintain his loratadine, nitroGLYCERIN, ferrous sulfate, losartan, isosorbide mononitrate, MAGNESIUM PO, apixaban, pantoprazole, furosemide, atorvastatin, NON FORMULARY, and metoprolol.  Meds ordered this encounter  Medications  . fluticasone (FLONASE) 50 MCG/ACT nasal spray    Sig: 1 spray in each nostril daily as needed for stuffiness    Dispense:  16 g    Refill:  11      Follow-up: Return in about 3 weeks (around 03/28/2016).  [name], MD

## 2016-03-07 NOTE — Progress Notes (Signed)
Pre visit review using our clinic review tool, if applicable. No additional management support is needed unless otherwise documented below in the visit note. 

## 2016-03-07 NOTE — Patient Instructions (Signed)

## 2016-03-08 ENCOUNTER — Encounter: Payer: Self-pay | Admitting: Internal Medicine

## 2016-03-08 ENCOUNTER — Telehealth: Payer: Self-pay | Admitting: Internal Medicine

## 2016-03-08 DIAGNOSIS — N281 Cyst of kidney, acquired: Secondary | ICD-10-CM | POA: Insufficient documentation

## 2016-03-08 LAB — RETICULOCYTES
ABS Retic: 51350 cells/uL (ref 25000–90000)
RBC.: 3.95 MIL/uL — AB (ref 4.20–5.80)
RETIC CT PCT: 1.3 %

## 2016-03-08 NOTE — Telephone Encounter (Signed)
[facility] called to inform our office that Dr. Ronnald Ramp order MRI abdomin, they are unable to do this because pt has pacemaker. FYI.

## 2016-03-08 NOTE — Telephone Encounter (Signed)
Changed to CT

## 2016-03-09 MED ORDER — FLUTICASONE PROPIONATE 50 MCG/ACT NA SUSP: NASAL | 11 refills | Status: DC

## 2016-03-09 NOTE — Telephone Encounter (Signed)
Can you please sign office note so I can do the pre [place]

## 2016-03-09 NOTE — Telephone Encounter (Signed)
done

## 2016-03-10 ENCOUNTER — Encounter: Payer: Self-pay | Admitting: Adult Health

## 2016-03-10 ENCOUNTER — Inpatient Hospital Stay: Admission: RE | Admit: 2016-03-10 | Payer: PPO | Source: Ambulatory Visit

## 2016-03-11 ENCOUNTER — Encounter: Payer: Self-pay | Admitting: Internal Medicine

## 2016-03-11 ENCOUNTER — Ambulatory Visit (INDEPENDENT_AMBULATORY_CARE_PROVIDER_SITE_OTHER)
Admission: RE | Admit: 2016-03-11 | Discharge: 2016-03-11 | Disposition: A | Discharge status: Home / Self Care | Payer: PPO | Source: Ambulatory Visit | Attending: Internal Medicine | Admitting: Internal Medicine

## 2016-03-11 DIAGNOSIS — K5730 Diverticulosis of large intestine without perforation or abscess without bleeding: Secondary | ICD-10-CM | POA: Diagnosis not present

## 2016-03-11 DIAGNOSIS — R10814 Left lower quadrant abdominal tenderness: Secondary | ICD-10-CM

## 2016-03-11 DIAGNOSIS — N281 Cyst of kidney, acquired: Secondary | ICD-10-CM

## 2016-03-11 MED ORDER — IOPAMIDOL (ISOVUE-300) INJECTION 61%
100.0000 mL | Freq: Once | INTRAVENOUS | Status: AC | PRN
  Administered 2016-03-11: 100 mL via INTRAVENOUS

## 2016-03-12 DIAGNOSIS — G4733 Obstructive sleep apnea (adult) (pediatric): Secondary | ICD-10-CM | POA: Diagnosis not present

## 2016-03-14 ENCOUNTER — Ambulatory Visit (INDEPENDENT_AMBULATORY_CARE_PROVIDER_SITE_OTHER): Payer: PPO | Admitting: Adult Health

## 2016-03-14 ENCOUNTER — Encounter: Payer: Self-pay | Admitting: Adult Health

## 2016-03-14 VITALS — BP 118/64 | HR 80 | Temp 98.1°F | Ht 69.5 in | Wt 216.2 lb

## 2016-03-14 DIAGNOSIS — G4733 Obstructive sleep apnea (adult) (pediatric): Secondary | ICD-10-CM

## 2016-03-14 NOTE — Patient Instructions (Signed)
Continue on C Pap at bedtime. Work on weight loss.  Do not drive if sleepy Follow with Dr. Elsworth Soho in 1 year and as needed Order for nasal pillows and chin strap to DME .

## 2016-03-14 NOTE — Assessment & Plan Note (Signed)
Well controlled on CPAP  Trial of nasal pillows w/ chin strap   Plan  Patient Instructions  Continue on C Pap at bedtime. Work on weight loss.  Do not drive if sleepy Follow with Dr. Elsworth Soho in 1 year and as needed Order for nasal pillows and chin strap to DME .

## 2016-03-14 NOTE — Progress Notes (Signed)
Subjective:    Patient ID: Donald [name], male    DOB: [birth year], 82 y.o.   MRN: [id number]  HPI 82year-old ex-smoker followed for  Moderate OSA on CPAP . He used dental appliances without any benefit before getting on CPAP  03/14/2016 Follow up :  Pt presents for a 1 year follow up for sleep apnea.  Says he has been doing well on CPAP . Would like to see if there is other mask options.  His mask leaks when he turns over a lot . Feels it makes too much noise.  Wear every night for ~8 hr  Download shows excellent compliance with avg usage at 8hr with AHI at 5.1 on set pressure at [place].  Min leaks.  Feels rested without significant daytime sleepiness.  Past Medical History:  Diagnosis Date  . Abnormal CT scan, stomach 02/2014   Thickening of gastric fundus and cardia.  gastritis on EGD 03/2014  . Allergic rhinitis due to pollen   . Arthropathy, unspecified, site unspecified   . Atherosclerosis    a. Noted by abdominal CT 02/2014 (h/o normal nuc 2011).  . Atrial flutter ([place]) 02/24/14   s/p ablation 11/15  . Cardiomyopathy ([place])    tachy mediated - a. TEE (10/15):  EF 30%;  b. Echo after NSR restored (10/15):  mild LVH, EF 55-60%, mild AS, mild AI, mild MR, mild to mod LAE, mild RAE  . Diverticulosis    severe in descending, sigmoid colon.   . ED (erectile dysfunction)   . Esophageal stricture 03/2014   traversable with endoscope. not dilated.   . Esophageal stricture   . Gastric AVM   . GERD (gastroesophageal reflux disease) 03/2014   small HH and gastritis on EGD  . GI bleed   . Habitual alcohol use   . Hemorrhoids   . Hiatal hernia   . Hyperlipidemia   . Hypertension   . Obesity   . OSA on CPAP   . PAF (paroxysmal atrial fibrillation) ([place])   . Prostatitis, unspecified   . Sinus bradycardia   . Skin cancer    basal and squamous cell   Current Outpatient Prescriptions on File Prior to Visit  Medication Sig Dispense Refill  . apixaban (ELIQUIS) 5 MG TABS  tablet Take 1 tablet (5 mg total) by mouth 2 (two) times daily. 60 tablet 11  . atorvastatin (LIPITOR) 40 MG tablet Take 1 tablet (40 mg total) by mouth daily. Yearly physical w/labs are due must see MD for future refills (Patient taking differently: Take 40 mg by mouth daily. ) 90 tablet 0  . ferrous sulfate 325 (65 FE) MG tablet Take 1 tablet (325 mg total) by mouth daily with breakfast. 60 tablet 3  . fluticasone (FLONASE) 50 MCG/ACT nasal spray 1 spray in each nostril daily as needed for stuffiness 16 g 11  . furosemide (LASIX) 40 MG tablet TAKE 1 TABLET EACH DAY. 30 tablet 3  . isosorbide mononitrate (IMDUR) 30 MG 24 hr tablet Take 1 tablet (30 mg total) by mouth daily. 90 tablet 3  . loratadine (CLARITIN) 10 MG tablet Take 10 mg by mouth daily as needed for allergies.     Marland Kitchen losartan (COZAAR) 100 MG tablet TAKE 1 TABLET DAILY. (Patient taking differently: Take 1 tablet by mouth daily in the evening.) 90 tablet 3  . MAGNESIUM PO Take 4 tablets by mouth 2 (two) times daily.     . metoprolol tartrate (LOPRESSOR) 50 MG tablet Take 1 tablet (  50 mg total) by mouth 2 (two) times daily. To start, take 1/2 tab (25mg ) by mouth twice daily for one week, then increase to 1 tab (50mg ) by mouth twice daily 60 tablet 3  . nitroGLYCERIN (NITROSTAT) 0.4 MG SL tablet Place 1 tablet (0.4 mg total) under the tongue every 5 (five) minutes as needed for chest pain. 25 tablet 1  . NON FORMULARY c pap    . pantoprazole (PROTONIX) 40 MG tablet TAKE 1 TABLET EACH DAY. 30 tablet 3   No current facility-administered medications on file prior to visit.      Significant tests/ events  Spirometry -  FEV1 ratio of 75, with FEV1 of 77% and FVC of 77% suggesting mild restriction CT chest in 04/2013 showed mild emphysema PSG 04/2014 - 206 lbs-Moderate OSA-stopped breathing 24 times an hour.- corrected by C Pap 13 cm, large full facemask    Review of Systems neg for any significant sore throat, dysphagia, itching,  sneezing, nasal congestion or excess/ purulent secretions, fever, chills, sweats, unintended wt loss, pleuritic or exertional cp, hempoptysis, orthopnea pnd or change in chronic leg swelling. Also denies presyncope, palpitations, heartburn, abdominal pain, nausea, vomiting, diarrhea or change in bowel or urinary habits, dysuria,hematuria, rash, arthralgias, visual complaints, headache, numbness weakness or ataxia.     Objective:   Physical Exam Vitals:   03/14/16 0859  BP: 118/64  Pulse: 80  Temp: 98.1 F (36.7 C)  TempSrc: Oral  SpO2: 96%  Weight: 216 lb 3.2 oz (98.1 kg)  Height: 5' 9.5" (1.765 m)   Body mass index is 31.47 kg/m.  Gen. Pleasant, well-nourished, in no distress ENT - no lesions, no post nasal drip, class 2 MP airway  Neck: No JVD, no thyromegaly, no carotid bruits Lungs: no use of accessory muscles, no dullness to percussion, clear without rales or rhonchi  Cardiovascular: Rhythm regular, heart sounds  normal, no murmurs or gallops, no peripheral edema Musculoskeletal: No deformities, no cyanosis or clubbing    Tammy Parrett NP-C  [facility]  03/14/2016

## 2016-03-29 DIAGNOSIS — B079 Viral wart, unspecified: Secondary | ICD-10-CM | POA: Diagnosis not present

## 2016-03-29 DIAGNOSIS — C44319 Basal cell carcinoma of skin of other parts of face: Secondary | ICD-10-CM | POA: Diagnosis not present

## 2016-03-29 DIAGNOSIS — L570 Actinic keratosis: Secondary | ICD-10-CM | POA: Diagnosis not present

## 2016-03-29 DIAGNOSIS — Z85828 Personal history of other malignant neoplasm of skin: Secondary | ICD-10-CM | POA: Diagnosis not present

## 2016-03-29 DIAGNOSIS — D485 Neoplasm of uncertain behavior of skin: Secondary | ICD-10-CM | POA: Diagnosis not present

## 2016-03-30 NOTE — Progress Notes (Signed)
Reviewed & agree with plan  

## 2016-04-05 DIAGNOSIS — C44319 Basal cell carcinoma of skin of other parts of face: Secondary | ICD-10-CM | POA: Diagnosis not present

## 2016-04-05 DIAGNOSIS — Z85828 Personal history of other malignant neoplasm of skin: Secondary | ICD-10-CM | POA: Diagnosis not present

## 2016-04-13 ENCOUNTER — Telehealth: Payer: Self-pay

## 2016-04-13 ENCOUNTER — Other Ambulatory Visit: Payer: Self-pay | Admitting: Internal Medicine

## 2016-04-13 DIAGNOSIS — G4733 Obstructive sleep apnea (adult) (pediatric): Secondary | ICD-10-CM | POA: Diagnosis not present

## 2016-04-13 DIAGNOSIS — I10 Essential (primary) hypertension: Secondary | ICD-10-CM

## 2016-04-13 MED ORDER — FUROSEMIDE 40 MG PO TABS: ORAL_TABLET | ORAL | 1 refills | Status: DC

## 2016-04-13 NOTE — Telephone Encounter (Signed)
Pt is rq 90 day supply to [facility]. pls advise

## 2016-04-14 DIAGNOSIS — G4733 Obstructive sleep apnea (adult) (pediatric): Secondary | ICD-10-CM | POA: Diagnosis not present

## 2016-04-18 DIAGNOSIS — H35342 Macular cyst, hole, or pseudohole, left eye: Secondary | ICD-10-CM | POA: Diagnosis not present

## 2016-04-18 DIAGNOSIS — H353132 Nonexudative age-related macular degeneration, bilateral, intermediate dry stage: Secondary | ICD-10-CM | POA: Diagnosis not present

## 2016-04-18 DIAGNOSIS — H35033 Hypertensive retinopathy, bilateral: Secondary | ICD-10-CM | POA: Diagnosis not present

## 2016-04-18 DIAGNOSIS — Z961 Presence of intraocular lens: Secondary | ICD-10-CM | POA: Diagnosis not present

## 2016-05-03 ENCOUNTER — Telehealth: Payer: Self-pay | Admitting: Internal Medicine

## 2016-05-03 NOTE — Telephone Encounter (Signed)
Notified.  Told to call back if she does not have form by Monday.

## 2016-05-03 NOTE — Telephone Encounter (Signed)
Faxed medical assesment form over a week ago.  Would like to know if Dr. Ronnald Ramp received this and where in the process of being complete.  Will fax again to [facility].

## 2016-05-03 NOTE — Telephone Encounter (Signed)
I have the form. Filling out.

## 2016-05-04 NOTE — Telephone Encounter (Signed)
LVM for pt to call back as soon as possible.   RE: Please get me when pt calls back.  

## 2016-05-04 NOTE — Telephone Encounter (Signed)
Pt called back and states that he and spouse are going into Assisted Living.   Form faxed back to [facility].

## 2016-05-04 NOTE — Telephone Encounter (Signed)
Forms given to PCP to sign

## 2016-05-07 ENCOUNTER — Other Ambulatory Visit: Payer: Self-pay | Admitting: Internal Medicine

## 2016-05-13 DIAGNOSIS — G4733 Obstructive sleep apnea (adult) (pediatric): Secondary | ICD-10-CM | POA: Diagnosis not present

## 2016-05-17 ENCOUNTER — Other Ambulatory Visit: Payer: Self-pay | Admitting: Internal Medicine

## 2016-05-23 ENCOUNTER — Encounter: Payer: Self-pay | Admitting: Internal Medicine

## 2016-05-23 ENCOUNTER — Ambulatory Visit (INDEPENDENT_AMBULATORY_CARE_PROVIDER_SITE_OTHER): Payer: PPO | Admitting: Internal Medicine

## 2016-05-23 VITALS — BP 122/70 | HR 74 | Ht 69.0 in | Wt 222.0 lb

## 2016-05-23 DIAGNOSIS — R001 Bradycardia, unspecified: Secondary | ICD-10-CM

## 2016-05-23 DIAGNOSIS — I495 Sick sinus syndrome: Secondary | ICD-10-CM

## 2016-05-23 DIAGNOSIS — Z950 Presence of cardiac pacemaker: Secondary | ICD-10-CM

## 2016-05-23 DIAGNOSIS — I483 Typical atrial flutter: Secondary | ICD-10-CM | POA: Diagnosis not present

## 2016-05-23 DIAGNOSIS — I480 Paroxysmal atrial fibrillation: Secondary | ICD-10-CM | POA: Diagnosis not present

## 2016-05-23 DIAGNOSIS — R079 Chest pain, unspecified: Secondary | ICD-10-CM

## 2016-05-23 LAB — CUP PACEART INCLINIC DEVICE CHECK
Battery Voltage: 2.99 V
Date Time Interrogation Session: 20180108085442
Implantable Lead Implant Date: 20171002
Implantable Lead Location: 753859
Implantable Lead Location: 753860
Implantable Pulse Generator Implant Date: 20171002
Lead Channel Impedance Value: 387.5 Ohm
Lead Channel Pacing Threshold Amplitude: 0.5 V
Lead Channel Pacing Threshold Amplitude: 0.5 V
Lead Channel Pacing Threshold Amplitude: 0.5 V
Lead Channel Pacing Threshold Pulse Width: 0.5 ms
Lead Channel Pacing Threshold Pulse Width: 0.5 ms
Lead Channel Pacing Threshold Pulse Width: 0.5 ms
Lead Channel Sensing Intrinsic Amplitude: 4.2 mV
Lead Channel Setting Pacing Amplitude: 0.75 V
Lead Channel Setting Pacing Pulse Width: 0.5 ms
MDC IDC LEAD IMPLANT DT: 20171002
MDC IDC MSMT LEADCHNL RA PACING THRESHOLD AMPLITUDE: 0.5 V
MDC IDC MSMT LEADCHNL RA PACING THRESHOLD PULSEWIDTH: 0.5 ms
MDC IDC MSMT LEADCHNL RV IMPEDANCE VALUE: 612.5 Ohm
MDC IDC MSMT LEADCHNL RV SENSING INTR AMPL: 12 mV
MDC IDC SET LEADCHNL RA PACING AMPLITUDE: 2 V
MDC IDC SET LEADCHNL RV SENSING SENSITIVITY: 2 mV
MDC IDC STAT BRADY RA PERCENT PACED: 98 %
MDC IDC STAT BRADY RV PERCENT PACED: 0.46 %
Pulse Gen Serial Number: 7951215

## 2016-05-23 MED ORDER — NITROGLYCERIN 0.4 MG SL SUBL: 0.4000 mg | SUBLINGUAL_TABLET | SUBLINGUAL | 3 refills | Status: DC | PRN

## 2016-05-23 MED ORDER — ISOSORBIDE MONONITRATE ER 60 MG PO TB24: 60.0000 mg | ORAL_TABLET | Freq: Every day | ORAL | 3 refills | Status: DC

## 2016-05-23 MED ORDER — LOSARTAN POTASSIUM 50 MG PO TABS: 50.0000 mg | ORAL_TABLET | Freq: Every day | ORAL | 3 refills | Status: DC

## 2016-05-23 NOTE — Patient Instructions (Addendum)
Medication Instructions: - Your physician has recommended you make the following change in your medication:  1) Increase imdur (isosorbide) to 60 mg- take one tablet by mouth once daily      (you may take isosorbide 30 mg- two tablets once daily until you use them up)  2) Decrease cozaar (losartan) to 50 mg- take one tablet by mouth once daily     (you make take losartan 100 mg- 1/2 tablet once daily until you use them up)  Labwork: - none ordered  Procedures/Testing: - Your physician has requested that you have an exercise tolerance test- in 4-6 weeks with Dr. Caryl Comes. For further information please visit [email or web address]. Please also follow instruction sheet, as given.   Follow-Up: -Remote monitoring is used to monitor your Pacemaker of ICD from home. This monitoring reduces the number of office visits required to check your device to one time per year. It allows [place] to keep an eye on the functioning of your device to ensure it is working properly. You are scheduled for a device check from home on 08/22/16. You may send your transmission at any time that day. If you have a wireless device, the transmission will be sent automatically. After your physician reviews your transmission, you will receive a postcard with your next transmission date.  - Your physician wants you to follow-up in: 9 months with Dr. Caryl Comes. You will receive a reminder letter in the mail two months in advance. If you don't receive a letter, please call our office to schedule the follow-up appointment.    Any Additional Special Instructions Will Be Listed Below (If Applicable).     If you need a refill on your cardiac medications before your next appointment, please call your pharmacy.

## 2016-05-23 NOTE — Progress Notes (Signed)
Patient Care Team: Janith Lima, MD as PCP - General (Internal Medicine) Franchot Gallo, MD (Urology) [name] [name], MD (Dermatology) Monna Fam, MD as Consulting Physician (Ophthalmology) Deboraha Sprang, MD as Consulting Physician (Cardiology)   HPI  Donald [name] is a 83 y.o. 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 ([employer]). He had presented with chest pain. This has been a recurring issue. Myoview was mildly abnormal catheterization recommended but preprocedure labs have demonstrated a hemoglobin of 7 and it was deferred.    October 2015 Echo - Left ventricle: The cavity size was normal. Wall thickness was increased in a pattern of mild LVH. Systolic function was normal. The estimated ejection fraction was in the range of 55% to 60%.<<30% (10/15)  Aortic valve: Heavily calcirfied right coronary cusp. There was mild stenosis. There was mild regurgitation.BICUSPID mean gradient was 10.  - Mitral valve: There was mild regurgitation. - Left atrium: The atrium was mildly to moderately dilated. - Right atrium: The atrium was mildly dilated. - Atrial septum: No defect or patent foramen ovale was identified.   DATE TEST    10/15    Echo   EF 55 %  mild AS ( mean gradient 10) --? Bicuspid aortic valve  9/16 myoview EF 60-65%   mild ischemia  10/17 Echo EF 60-65%  No comment on bicuspid valve//mild - Mod  AI     Date      10/17    Cr  0.97      K   4.6 Hgb 13.2  TSH 0.56   He continues to complain of chest discomfort. This is provoked by exercise but also is apparent on awaking in the morning. Typically lasts minutes and response to nitroglycerin. It could be accompanied by shortness of breath. He also notes dyspnea on exertion. There has been no peripheral edema    Past Medical History:  Diagnosis Date  . Abnormal CT scan, stomach 02/2014   Thickening of gastric fundus and cardia.   gastritis on EGD 03/2014  . Allergic rhinitis due to pollen   . Arthropathy, unspecified, site unspecified   . Atherosclerosis    a. Noted by abdominal CT 02/2014 (h/o normal nuc 2011).  . Atrial flutter ([place]) 02/24/14   s/p ablation 11/15  . Cardiomyopathy ([place])    tachy mediated - a. TEE (10/15):  EF 30%;  b. Echo after NSR restored (10/15):  mild LVH, EF 55-60%, mild AS, mild AI, mild MR, mild to mod LAE, mild RAE  . Diverticulosis    severe in descending, sigmoid colon.   . ED (erectile dysfunction)   . Esophageal stricture 03/2014   traversable with endoscope. not dilated.   . Esophageal stricture   . Gastric AVM   . GERD (gastroesophageal reflux disease) 03/2014   small HH and gastritis on EGD  . GI bleed   . Habitual alcohol use   . Hemorrhoids   . Hiatal hernia   . Hyperlipidemia   . Hypertension   . Obesity   . OSA on CPAP   . PAF (paroxysmal atrial fibrillation) ([place])   . Prostatitis, unspecified   . Sinus bradycardia   . Skin cancer    basal and squamous cell    Past Surgical History:  Procedure Laterality Date  . ABLATION  03/19/2014   RFCA of atrial flutter by Dr Caryl Comes  . ATRIAL FLUTTER ABLATION N/A 03/19/2014   Procedure: ATRIAL FLUTTER  ABLATION;  Surgeon: Deboraha Sprang, MD;  Location: [facility] CATH LAB;  Service: Cardiovascular;  Laterality: N/A;  . CARDIOVERSION N/A 02/24/2014   Procedure: CARDIOVERSION;  Surgeon: Lelon Perla, MD;  Location: [place];  Service: Cardiovascular;  Laterality: N/A;  . CARDIOVERSION Right 09/10/2014   Procedure: CARDIOVERSION;  Surgeon: Deboraha Sprang, MD;  Location: [facility] CATH LAB;  Service: Cardiovascular;  Laterality: Right;  . COLONOSCOPY N/A 04/23/2015   Procedure: COLONOSCOPY;  Surgeon: Ladene Artist, MD;  Location: [facility] ENDOSCOPY;  Service: Endoscopy;  Laterality: N/A;  . ENTEROSCOPY N/A 06/12/2015   Procedure: ENTEROSCOPY;  Surgeon: Ladene Artist, MD;  Location: [facility] ENDOSCOPY;  Service: Endoscopy;  Laterality: N/A;  .  EP IMPLANTABLE DEVICE N/A 02/15/2016   Procedure: Pacemaker Implant;  Surgeon: Evans Lance, MD;  Location: [place] CV LAB;  Service: Cardiovascular;  Laterality: N/A;  . ESOPHAGOGASTRODUODENOSCOPY N/A 04/09/2014   Procedure: ESOPHAGOGASTRODUODENOSCOPY (EGD);  Surgeon: Inda Castle, MD;  Location: [place];  Service: Endoscopy;  Laterality: N/A;  . ESOPHAGOGASTRODUODENOSCOPY N/A 04/23/2015   Procedure: ESOPHAGOGASTRODUODENOSCOPY (EGD);  Surgeon: Ladene Artist, MD;  Location: [facility] ENDOSCOPY;  Service: Endoscopy;  Laterality: N/A;  . HERNIA REPAIR    . INGUINAL HERNIA REPAIR     right  . INGUINAL HERNIA REPAIR     left  . ORIF fracture of the elbow    . SKIN CANCER EXCISION  05/21/12   Squamous cell ca  . SQUAMOUS CELL CARCINOMA EXCISION    . TEE WITHOUT CARDIOVERSION N/A 02/24/2014   Procedure: TRANSESOPHAGEAL ECHOCARDIOGRAM (TEE);  Surgeon: Lelon Perla, MD;  Location: [facility] ENDOSCOPY;  Service: Cardiovascular;  Laterality: N/A;  . TONSILLECTOMY      Current Outpatient Prescriptions  Medication Sig Dispense Refill  . atorvastatin (LIPITOR) 40 MG tablet TAKE 1 TABLET ONCE DAILY. 90 tablet 3  . ELIQUIS 5 MG TABS tablet TAKE 1 TABLET TWICE DAILY. 60 tablet 6  . ferrous sulfate 325 (65 FE) MG tablet Take 1 tablet (325 mg total) by mouth daily with breakfast. 60 tablet 3  . fluticasone (FLONASE) 50 MCG/ACT nasal spray 1 spray in each nostril daily as needed for stuffiness 16 g 11  . furosemide (LASIX) 40 MG tablet TAKE 1 TABLET EACH DAY. 90 tablet 1  . isosorbide mononitrate (IMDUR) 30 MG 24 hr tablet Take 1 tablet (30 mg total) by mouth daily. 90 tablet 3  . loratadine (CLARITIN) 10 MG tablet Take 10 mg by mouth daily as needed for allergies.     Marland Kitchen losartan (COZAAR) 100 MG tablet Take 1 tablet (100 mg total) by mouth daily. 30 tablet 0  . MAGNESIUM PO Take 4 tablets by mouth 2 (two) times daily.     . metoprolol tartrate (LOPRESSOR) 50 MG tablet Take 1 tablet (50 mg total) by  mouth 2 (two) times daily. To start, take 1/2 tab (25mg ) by mouth twice daily for one week, then increase to 1 tab (50mg ) by mouth twice daily 60 tablet 3  . nitroGLYCERIN (NITROSTAT) 0.4 MG SL tablet Place 1 tablet (0.4 mg total) under the tongue every 5 (five) minutes as needed for chest pain. 25 tablet 1  . NON FORMULARY c pap    . pantoprazole (PROTONIX) 40 MG tablet TAKE 1 TABLET EACH DAY. 30 tablet 2   No current facility-administered medications for this visit.     No Known Allergies  Review of Systems negative except from HPI and PMH  Physical Exam BP 122/70  Pulse 74   Ht 5\' 9"  (1.753 m)   Wt 222 lb (100.7 kg)   SpO2 95%   BMI 32.78 kg/m  Well developed and well nourished in no acute distress HENT normal E scleral and icterus clear Neck Supple Device pocket well healed; without hematoma or erythema.  There is no tethering .dp JVP 5-6 carotids brisk and full Clear to ausculation RRR with 2/6  murmurs Soft with active bowel sounds No clubbing cyanosis  Edema Alert and oriented, grossly normal motor and sensory function Skin Warm and Dry   ECG demonstrates atrial  pacing at 60  18/09/39 Otherwise normal   Assessment and  Plan  Atrial fibrillation/flutter-paroxysmal  CHADS-VASc score 5 (age-25 hypertension-1 cardiomyopathy-1 vascular disease-1) Tachypalpitations  Aortic Valve Bicuspid previously described but no longer evident  Cardiomyopathy-resolved  Pacemaker-[employer]  Hypertension  Exertional chest discomfort  Abnormal Myoview scan evidence of ischemia   HFpEF/dyspnea on exertion   It is not clear what is contributing to his dyspnea and chest discomfort. The fact that he will chest discomfort. Nonischemic Myoview was abnormal. We discussed invasive as well as a medical approach; for now we will increase his Imdur from 30--60 and use pre-exercise sublingual nitroglycerin. Given the fact that his blood pressure is well-controlled we will decrease his  losartan 100--50  I have told him the data suggests that catheterization intervention is to attenuate symptoms but not on long-term heart endpoints  His heart rate excursion on his pacemaker seems reasonable. It may be more or less than what he is used to and so we will anticipate exercise testing in a month or so with reprogramming.  There is no evidence of volume overload.  More than 50% of 45 min was spent in counseling related to the above

## 2016-06-04 NOTE — Telephone Encounter (Signed)
Left message with [facility].   RE: wanted to make sure that they received all of the forms they requested.

## 2016-06-06 DIAGNOSIS — L821 Other seborrheic keratosis: Secondary | ICD-10-CM | POA: Diagnosis not present

## 2016-06-06 DIAGNOSIS — Z85828 Personal history of other malignant neoplasm of skin: Secondary | ICD-10-CM | POA: Diagnosis not present

## 2016-06-06 DIAGNOSIS — D225 Melanocytic nevi of trunk: Secondary | ICD-10-CM | POA: Diagnosis not present

## 2016-06-06 DIAGNOSIS — L570 Actinic keratosis: Secondary | ICD-10-CM | POA: Diagnosis not present

## 2016-06-06 DIAGNOSIS — C44319 Basal cell carcinoma of skin of other parts of face: Secondary | ICD-10-CM | POA: Diagnosis not present

## 2016-06-13 DIAGNOSIS — G4733 Obstructive sleep apnea (adult) (pediatric): Secondary | ICD-10-CM | POA: Diagnosis not present

## 2016-06-24 ENCOUNTER — Ambulatory Visit (INDEPENDENT_AMBULATORY_CARE_PROVIDER_SITE_OTHER): Payer: PPO

## 2016-06-24 ENCOUNTER — Encounter: Payer: PPO | Admitting: Internal Medicine

## 2016-06-24 ENCOUNTER — Other Ambulatory Visit: Payer: Self-pay | Admitting: Physician Assistant

## 2016-06-24 DIAGNOSIS — R079 Chest pain, unspecified: Secondary | ICD-10-CM | POA: Diagnosis not present

## 2016-06-27 ENCOUNTER — Other Ambulatory Visit: Payer: Self-pay | Admitting: Internal Medicine

## 2016-06-30 ENCOUNTER — Encounter: Payer: Self-pay | Admitting: Internal Medicine

## 2016-07-04 ENCOUNTER — Encounter: Payer: Self-pay | Admitting: Internal Medicine

## 2016-07-07 ENCOUNTER — Encounter: Payer: Self-pay | Admitting: Internal Medicine

## 2016-07-08 LAB — EXERCISE TOLERANCE TEST
CHL CUP MPHR: 139 {beats}/min
CHL CUP RESTING HR STRESS: 60 {beats}/min
CHL CUP STRESS STAGE 1 DBP: 70 mmHg
CHL CUP STRESS STAGE 1 SBP: 117 mmHg
CHL CUP STRESS STAGE 2 HR: 88 {beats}/min
CHL CUP STRESS STAGE 3 GRADE: 0.1 %
CHL CUP STRESS STAGE 3 SPEED: 1 mph
CHL CUP STRESS STAGE 4 DBP: 67 mmHg
CHL CUP STRESS STAGE 4 HR: 110 {beats}/min
CHL CUP STRESS STAGE 4 SBP: 166 mmHg
CHL CUP STRESS STAGE 5 SPEED: 2.5 mph
CHL CUP STRESS STAGE 6 DBP: 73 mmHg
CHL CUP STRESS STAGE 6 SPEED: 0 mph
CHL CUP STRESS STAGE 7 GRADE: 0 %
CHL CUP STRESS STAGE 7 SBP: 130 mmHg
CHL CUP STRESS STAGE 7 SPEED: 0 mph
CHL RATE OF PERCEIVED EXERTION: 14
CSEPPHR: 110 {beats}/min
Estimated workload: 7 METS
Exercise duration (min): 5 min
Exercise duration (sec): 21 s
Percent HR: 87 %
Percent of predicted max HR: 79 %
Stage 1 Grade: 0 %
Stage 1 HR: 83 {beats}/min
Stage 1 Speed: 0 mph
Stage 2 Grade: 0 %
Stage 2 Speed: 1 mph
Stage 3 HR: 110 {beats}/min
Stage 4 Grade: 10 %
Stage 4 Speed: 1.7 mph
Stage 5 Grade: 12 %
Stage 5 HR: 110 {beats}/min
Stage 6 Grade: 0 %
Stage 6 HR: 88 {beats}/min
Stage 6 SBP: 173 mmHg
Stage 7 DBP: 79 mmHg
Stage 7 HR: 60 {beats}/min

## 2016-07-13 DIAGNOSIS — G4733 Obstructive sleep apnea (adult) (pediatric): Secondary | ICD-10-CM | POA: Diagnosis not present

## 2016-07-20 ENCOUNTER — Other Ambulatory Visit: Payer: Self-pay | Admitting: Internal Medicine

## 2016-07-26 ENCOUNTER — Telehealth: Payer: Self-pay | Admitting: Internal Medicine

## 2016-07-26 MED ORDER — PANTOPRAZOLE SODIUM 40 MG PO TBEC: DELAYED_RELEASE_TABLET | ORAL | 1 refills | Status: DC

## 2016-07-26 NOTE — Telephone Encounter (Signed)
Rx sent 

## 2016-08-09 ENCOUNTER — Ambulatory Visit (INDEPENDENT_AMBULATORY_CARE_PROVIDER_SITE_OTHER): Payer: PPO | Admitting: Internal Medicine

## 2016-08-09 ENCOUNTER — Encounter: Payer: Self-pay | Admitting: Internal Medicine

## 2016-08-09 VITALS — BP 128/82 | HR 67 | Temp 97.8°F | Ht 69.0 in | Wt 225.0 lb

## 2016-08-09 DIAGNOSIS — I10 Essential (primary) hypertension: Secondary | ICD-10-CM | POA: Diagnosis not present

## 2016-08-09 DIAGNOSIS — R05 Cough: Secondary | ICD-10-CM | POA: Diagnosis not present

## 2016-08-09 DIAGNOSIS — R059 Cough, unspecified: Secondary | ICD-10-CM

## 2016-08-09 MED ORDER — AZITHROMYCIN 250 MG PO TABS: ORAL_TABLET | ORAL | 1 refills | Status: DC

## 2016-08-09 MED ORDER — HYDROCODONE-HOMATROPINE 5-1.5 MG/5ML PO SYRP: 5.0000 mL | ORAL_SOLUTION | Freq: Four times a day (QID) | ORAL | 0 refills | Status: AC | PRN

## 2016-08-09 NOTE — Patient Instructions (Signed)
Please take all new medication as prescribed - the antibiotic, and cough medicine ° °Please continue all other medications as before, and refills have been done if requested. ° °Please have the pharmacy call with any other refills you may need. ° °Please continue your efforts at being more active, low cholesterol diet, and weight control. ° °Please keep your appointments with your specialists as you may have planned ° ° ° °

## 2016-08-09 NOTE — Progress Notes (Signed)
Pre visit review using our clinic review tool, if applicable. No additional management support is needed unless otherwise documented below in the visit note. 

## 2016-08-09 NOTE — Progress Notes (Signed)
Subjective:    Patient ID: Donald [name], male    DOB: [birth year], 83 y.o.   MRN: [id number]  HPI  Here with acute onset mild to mod 2-3 days ST, HA, general weakness and malaise, with prod cough greenish sputum, but Pt denies chest pain, increased sob or doe, wheezing, orthopnea, PND, increased LE swelling, palpitations, dizziness or syncope.  Pt denies new neurological symptoms such as new headache, or facial or extremity weakness or numbness   Pt denies polydipsia, polyuria,  Past Medical History:  Diagnosis Date  . Abnormal CT scan, stomach 02/2014   Thickening of gastric fundus and cardia.  gastritis on EGD 03/2014  . Allergic rhinitis due to pollen   . Arthropathy, unspecified, site unspecified   . Atherosclerosis    a. Noted by abdominal CT 02/2014 (h/o normal nuc 2011).  . Atrial flutter ([place]) 02/24/14   s/p ablation 11/15  . Cardiomyopathy ([place])    tachy mediated - a. TEE (10/15):  EF 30%;  b. Echo after NSR restored (10/15):  mild LVH, EF 55-60%, mild AS, mild AI, mild MR, mild to mod LAE, mild RAE  . Diverticulosis    severe in descending, sigmoid colon.   . ED (erectile dysfunction)   . Esophageal stricture 03/2014   traversable with endoscope. not dilated.   . Esophageal stricture   . Gastric AVM   . GERD (gastroesophageal reflux disease) 03/2014   small HH and gastritis on EGD  . GI bleed   . Habitual alcohol use   . Hemorrhoids   . Hiatal hernia   . Hyperlipidemia   . Hypertension   . Obesity   . OSA on CPAP   . PAF (paroxysmal atrial fibrillation) ([place])   . Prostatitis, unspecified   . Sinus bradycardia   . Skin cancer    basal and squamous cell   Past Surgical History:  Procedure Laterality Date  . ABLATION  03/19/2014   RFCA of atrial flutter by Dr Caryl Comes  . ATRIAL FLUTTER ABLATION N/A 03/19/2014   Procedure: ATRIAL FLUTTER ABLATION;  Surgeon: Deboraha Sprang, MD;  [facility] CATH LAB;  Service: Cardiovascular;  Laterality: N/A;  . CARDIOVERSION N/A  02/24/2014   Procedure: CARDIOVERSION;  Surgeon: Lelon Perla, MD;  Location: [place];  Service: Cardiovascular;  Laterality: N/A;  . CARDIOVERSION Right 09/10/2014   Procedure: CARDIOVERSION;  Surgeon: Deboraha Sprang, MD;  Location: [facility] CATH LAB;  Service: Cardiovascular;  Laterality: Right;  . COLONOSCOPY N/A 04/23/2015   Procedure: COLONOSCOPY;  Surgeon: Ladene Artist, MD;  Location: [facility] ENDOSCOPY;  Service: Endoscopy;  Laterality: N/A;  . ENTEROSCOPY N/A 06/12/2015   Procedure: ENTEROSCOPY;  Surgeon: Ladene Artist, MD;  Location: [facility] ENDOSCOPY;  Service: Endoscopy;  Laterality: N/A;  . EP IMPLANTABLE DEVICE N/A 02/15/2016   Procedure: Pacemaker Implant;  Surgeon: Evans Lance, MD;  Location: [place] CV LAB;  Service: Cardiovascular;  Laterality: N/A;  . ESOPHAGOGASTRODUODENOSCOPY N/A 04/09/2014   Procedure: ESOPHAGOGASTRODUODENOSCOPY (EGD);  Surgeon: Inda Castle, MD;  Location: [place];  Service: Endoscopy;  Laterality: N/A;  . ESOPHAGOGASTRODUODENOSCOPY N/A 04/23/2015   Procedure: ESOPHAGOGASTRODUODENOSCOPY (EGD);  Surgeon: Ladene Artist, MD;  Location: [facility] ENDOSCOPY;  Service: Endoscopy;  Laterality: N/A;  . HERNIA REPAIR    . INGUINAL HERNIA REPAIR     right  . INGUINAL HERNIA REPAIR     left  . ORIF fracture of the elbow    . SKIN CANCER EXCISION  05/21/12  Squamous cell ca  . SQUAMOUS CELL CARCINOMA EXCISION    . TEE WITHOUT CARDIOVERSION N/A 02/24/2014   Procedure: TRANSESOPHAGEAL ECHOCARDIOGRAM (TEE);  Surgeon: Lelon Perla, MD;  Location: [facility] ENDOSCOPY;  Service: Cardiovascular;  Laterality: N/A;  . TONSILLECTOMY      reports that he quit smoking about 7 years ago. His smoking use included Cigarettes and Cigars. He smoked 0.00 packs per day for 40.00 years. His smokeless tobacco use includes Chew. He reports that he drinks about 12.6 oz of alcohol per week . He reports that he does not use drugs. family history includes Cancer in his brother and  mother; Other in his father; Pulmonary fibrosis in his brother. No Known Allergies Current Outpatient Prescriptions on File Prior to Visit  Medication Sig Dispense Refill  . atorvastatin (LIPITOR) 40 MG tablet TAKE 1 TABLET ONCE DAILY. 90 tablet 3  . ELIQUIS 5 MG TABS tablet TAKE 1 TABLET TWICE DAILY. 60 tablet 6  . ferrous sulfate 325 (65 FE) MG tablet Take 1 tablet (325 mg total) by mouth daily with breakfast. 60 tablet 3  . fluticasone (FLONASE) 50 MCG/ACT nasal spray 1 spray in each nostril daily as needed for stuffiness 16 g 11  . furosemide (LASIX) 40 MG tablet TAKE 1 TABLET EACH DAY. 90 tablet 1  . isosorbide mononitrate (IMDUR) 60 MG 24 hr tablet Take 1 tablet (60 mg total) by mouth daily. 90 tablet 3  . loratadine (CLARITIN) 10 MG tablet Take 10 mg by mouth daily as needed for allergies.     Marland Kitchen losartan (COZAAR) 50 MG tablet Take 1 tablet (50 mg total) by mouth daily. 90 tablet 3  . MAGNESIUM PO Take 4 tablets by mouth 2 (two) times daily.     . metoprolol (LOPRESSOR) 50 MG tablet Take 1 tablet (50 mg total) by mouth 2 (two) times daily. 180 tablet 1  . nitroGLYCERIN (NITROSTAT) 0.4 MG SL tablet Place 1 tablet (0.4 mg total) under the tongue every 5 (five) minutes as needed for chest pain. 25 tablet 3  . NON FORMULARY c pap    . pantoprazole (PROTONIX) 40 MG tablet TAKE 1 TABLET EACH DAY. 30 tablet 1   No current facility-administered medications on file prior to visit.    Review of Systems All otherwise neg per pt    Objective:   Physical Exam BP 128/82   Pulse 67   Temp 97.8 F (36.6 C) (Oral)   Ht 5\' 9"  (1.753 m)   Wt 225 lb (102.1 kg)   SpO2 98%   BMI 33.23 kg/m  VS noted, mild ill Constitutional: Pt appears in NAD HENT: Head: NCAT.  Right Ear: External ear normal.  Left Ear: External ear normal.  Eyes: . Pupils are equal, round, and reactive to light. Conjunctivae and EOM are normal Nose: without d/c or deformity Bilat tm's with mild erythema.  Max sinus areas  non tender.  Pharynx with mild erythema, no exudate Neck: Neck supple. Gross normal ROM Cardiovascular: Normal rate and regular rhythm.   Pulmonary/Chest: Effort normal and breath sounds decreased without rales or wheezing.  Neurological: Pt is alert. At baseline orientation, motor grossly intact Skin: Skin is warm. No rashes, other new lesions, no LE edema Psychiatric: Pt behavior is normal without agitation  No other exam findings    Assessment & Plan:

## 2016-08-12 DIAGNOSIS — G4733 Obstructive sleep apnea (adult) (pediatric): Secondary | ICD-10-CM | POA: Diagnosis not present

## 2016-08-13 NOTE — Assessment & Plan Note (Signed)
stable overall by history and exam, recent data reviewed with pt, and pt to continue medical treatment as before,  to f/u any worsening symptoms or concerns BP Readings from Last 3 Encounters:  08/09/16 128/82  05/23/16 122/70  03/14/16 118/64

## 2016-08-13 NOTE — Assessment & Plan Note (Signed)
Mild to mod, for antibx course,  to f/u any worsening symptoms or concerns 

## 2016-08-19 ENCOUNTER — Encounter: Payer: Self-pay | Admitting: *Deleted

## 2016-08-22 ENCOUNTER — Ambulatory Visit (INDEPENDENT_AMBULATORY_CARE_PROVIDER_SITE_OTHER): Payer: PPO | Admitting: *Deleted

## 2016-08-22 DIAGNOSIS — I495 Sick sinus syndrome: Secondary | ICD-10-CM | POA: Diagnosis not present

## 2016-08-23 NOTE — Progress Notes (Signed)
Remote pacemaker transmission.   

## 2016-08-24 ENCOUNTER — Encounter: Payer: Self-pay | Admitting: Cardiology

## 2016-08-26 LAB — CUP PACEART REMOTE DEVICE CHECK
Battery Remaining Longevity: 112 mo
Battery Remaining Percentage: 95.5 %
Battery Voltage: 3.01 V
Brady Statistic AS VP Percent: 1 %
Brady Statistic RA Percent Paced: 99 %
Date Time Interrogation Session: 20180409060017
Implantable Lead Implant Date: 20171002
Implantable Lead Location: 753860
Implantable Pulse Generator Implant Date: 20171002
Lead Channel Impedance Value: 390 Ohm
Lead Channel Pacing Threshold Amplitude: 0.5 V
Lead Channel Pacing Threshold Amplitude: 0.5 V
Lead Channel Pacing Threshold Pulse Width: 0.5 ms
Lead Channel Sensing Intrinsic Amplitude: 3.1 mV
Lead Channel Setting Pacing Amplitude: 2 V
MDC IDC LEAD IMPLANT DT: 20171002
MDC IDC LEAD LOCATION: 753859
MDC IDC MSMT LEADCHNL RV IMPEDANCE VALUE: 590 Ohm
MDC IDC MSMT LEADCHNL RV PACING THRESHOLD PULSEWIDTH: 0.5 ms
MDC IDC MSMT LEADCHNL RV SENSING INTR AMPL: 12 mV
MDC IDC PG SERIAL: 7951215
MDC IDC SET LEADCHNL RV PACING AMPLITUDE: 0.75 V
MDC IDC SET LEADCHNL RV PACING PULSEWIDTH: 0.5 ms
MDC IDC SET LEADCHNL RV SENSING SENSITIVITY: 2 mV
MDC IDC STAT BRADY AP VP PERCENT: 1 %
MDC IDC STAT BRADY AP VS PERCENT: 99 %
MDC IDC STAT BRADY AS VS PERCENT: 1 %
MDC IDC STAT BRADY RV PERCENT PACED: 1 %
Pulse Gen Model: 2272

## 2016-09-06 ENCOUNTER — Ambulatory Visit (INDEPENDENT_AMBULATORY_CARE_PROVIDER_SITE_OTHER): Payer: PPO | Admitting: Internal Medicine

## 2016-09-06 ENCOUNTER — Other Ambulatory Visit (INDEPENDENT_AMBULATORY_CARE_PROVIDER_SITE_OTHER): Payer: PPO

## 2016-09-06 ENCOUNTER — Encounter: Payer: Self-pay | Admitting: Internal Medicine

## 2016-09-06 VITALS — BP 94/58 | HR 64 | Ht 67.0 in | Wt 223.5 lb

## 2016-09-06 DIAGNOSIS — R143 Flatulence: Secondary | ICD-10-CM

## 2016-09-06 DIAGNOSIS — R151 Fecal smearing: Secondary | ICD-10-CM

## 2016-09-06 DIAGNOSIS — R140 Abdominal distension (gaseous): Secondary | ICD-10-CM

## 2016-09-06 DIAGNOSIS — K648 Other hemorrhoids: Secondary | ICD-10-CM

## 2016-09-06 DIAGNOSIS — K589 Irritable bowel syndrome without diarrhea: Secondary | ICD-10-CM | POA: Diagnosis not present

## 2016-09-06 DIAGNOSIS — D509 Iron deficiency anemia, unspecified: Secondary | ICD-10-CM

## 2016-09-06 LAB — COMPREHENSIVE METABOLIC PANEL
ALBUMIN: 4.3 g/dL (ref 3.5–5.2)
ALT: 14 U/L (ref 0–53)
AST: 18 U/L (ref 0–37)
Alkaline Phosphatase: 69 U/L (ref 39–117)
BUN: 27 mg/dL — ABNORMAL HIGH (ref 6–23)
CHLORIDE: 104 meq/L (ref 96–112)
CO2: 31 meq/L (ref 19–32)
Calcium: 9.8 mg/dL (ref 8.4–10.5)
Creatinine, Ser: 1.07 mg/dL (ref 0.40–1.50)
GFR: 70.38 mL/min (ref >60.00)
Glucose, Bld: 87 mg/dL (ref 70–99)
Potassium: 5.2 mEq/L — ABNORMAL HIGH (ref 3.5–5.1)
Sodium: 140 mEq/L (ref 135–145)
Total Bilirubin: 0.7 mg/dL (ref 0.2–1.2)
Total Protein: 7.1 g/dL (ref 6.0–8.3)

## 2016-09-06 LAB — CBC WITH DIFFERENTIAL/PLATELET
BASOS PCT: 0.7 % (ref 0.0–3.0)
Basophils Absolute: 0 10*3/uL (ref 0.0–0.1)
EOS PCT: 2.6 % (ref 0.0–5.0)
Eosinophils Absolute: 0.2 10*3/uL (ref 0.0–0.7)
HEMATOCRIT: 41.4 % (ref 39.0–52.0)
Hemoglobin: 14.1 g/dL (ref 13.0–17.0)
LYMPHS PCT: 24.4 % (ref 12.0–46.0)
Lymphs Abs: 1.7 10*3/uL (ref 0.7–4.0)
MCHC: 34 g/dL (ref 30.0–36.0)
MCV: 98.5 fl (ref 78.0–100.0)
MONOS PCT: 9.8 % (ref 3.0–12.0)
Monocytes Absolute: 0.7 10*3/uL (ref 0.1–1.0)
NEUTROS ABS: 4.3 10*3/uL (ref 1.4–7.7)
Neutrophils Relative %: 62.5 % (ref 43.0–77.0)
Platelets: 185 10*3/uL (ref 150.0–400.0)
RBC: 4.2 Mil/uL — ABNORMAL LOW (ref 4.22–5.81)
RDW: 13.6 % (ref 11.5–15.5)
WBC: 6.9 10*3/uL (ref 4.0–10.5)

## 2016-09-06 LAB — IBC PANEL
IRON: 121 ug/dL (ref 42–165)
SATURATION RATIOS: 31.5 % (ref 20.0–50.0)
TRANSFERRIN: 274 mg/dL (ref 212.0–360.0)

## 2016-09-06 LAB — FERRITIN: Ferritin: 29.8 ng/mL (ref 22.0–322.0)

## 2016-09-06 MED ORDER — RIFAXIMIN 550 MG PO TABS: 550.0000 mg | ORAL_TABLET | Freq: Three times a day (TID) | ORAL | 0 refills | Status: DC

## 2016-09-06 MED ORDER — PANTOPRAZOLE SODIUM 40 MG PO TBEC: DELAYED_RELEASE_TABLET | ORAL | 1 refills | Status: DC

## 2016-09-06 NOTE — Patient Instructions (Addendum)
We have sent the following medications to your pharmacy for you to pick up at your convenience: Pantoprazole 40 mg daily Xifaxan 550 mg three times daily x 14 days  Please purchase the following medications over the counter and take as directed: Ferrous sulfate 325 mg daily  Your physician has requested that you go to the basement for the following lab work before leaving today: CBC, CMP, IBC, Ferritin  Please follow up with Dr Hilarie Fredrickson in 3 months.  If you are age 63 or older, your body mass index should be between 23-30. Your Body mass index is 35.01 kg/m. If this is out of the aforementioned range listed, please consider follow up with your Primary Care Provider.  If you are age 80 or younger, your body mass index should be between 19-25. Your Body mass index is 35.01 kg/m. If this is out of the aformentioned range listed, please consider follow up with your Primary Care Provider.

## 2016-09-06 NOTE — Progress Notes (Signed)
Subjective:    Patient ID: Donald [name], male    DOB: 02/15/1933, 83 y.o.   MRN: [id number]  HPI Donald [name] is an 83 year old male with history of iron deficiency anemia, heme positive stools with gastritis, history of gastric angiodysplasias, history of colon polyps, history of diverticulosis, history of internal hemorrhoids who is here for follow-up. He was last seen one year ago. He has history of A. fib on Eliquis, pacemaker in place, CAD, hypertension.  He reports that he has been feeling overall fairly well. Energy levels are much better than when he was anemic but still remain low. He is gaining weight and he feels that this is due to inactivity. He and his wife are moving to [place] at the end of May and he is hopeful to use their workout facilities and also use a more healthy diet.  From a GI perspective he has remained on pantoprazole and denies abdominal pain. Denies nausea, vomiting, early satiety, dysphagia and odynophagia. No heartburn. He does report frequent gas and flatulence. This is not painful but more annoying to him. Stools have varied slightly and at times can be loose. This depends on his diet. He is not constipated. He's having 3 sometimes 4 stools per day. Always in the morning, occasionally again in the morning and then possibly 1-2 times in the evening. He does drink 2-4 alcoholic drinks per day either wine or bourbon. He denies chest pain, shortness of breath and increasing lower extremity edema. His stools are dark with iron but he has not seen any blood or obvious melanotic stool. He does have some fecal smearing as well as prolapsing internal hemorrhoids which of been an issue for him in the past.  His blood counts and iron studies were checked in October and had normalized.  Review of Systems As per HPI, otherwise negative  Current Medications, Allergies, Past Medical History, Past Surgical History, Family History and Social History were reviewed in  [employer] record.     Objective:   Physical Exam BP (!) 94/58 (BP Location: Left Arm, Patient Position: Sitting, Cuff Size: Normal)   Pulse 64   Ht 5\' 7"  (1.702 m) Comment: height measured without shoes  Wt 223 lb 8 oz (101.4 kg)   BMI 35.01 kg/m  Constitutional: Well-developed and well-nourished. No distress. HEENT: Normocephalic and atraumatic No scleral icterus. Neck: Neck supple. Trachea midline. Cardiovascular: Normal rate, regular rhythm and intact distal pulses. 2/6 SEM, pacemaker in place Pulmonary/chest: Effort normal and breath sounds normal.  Abdominal: Soft, nontender, nondistended. Bowel sounds active throughout.  Extremities: no clubbing, cyanosis, or edema Neurological: Alert and oriented to person place and time. Skin: Skin is warm and dry. Psychiatric: Normal mood and affect. Behavior is normal.  CBC    Component Value Date/Time   WBC 9.3 03/07/2016 1408   RBC 4.00 (L) 03/07/2016 1408   RBC 3.95 (L) 03/07/2016 1408   HGB 13.2 03/07/2016 1408   HCT 38.4 (L) 03/07/2016 1408   PLT 193.0 03/07/2016 1408   MCV 96.0 03/07/2016 1408   MCH 32.8 02/15/2016 0314   MCHC 34.3 03/07/2016 1408   RDW 14.3 03/07/2016 1408   LYMPHSABS 1.6 03/07/2016 1408   MONOABS 0.5 03/07/2016 1408   EOSABS 0.2 03/07/2016 1408   BASOSABS 0.0 03/07/2016 1408   Iron/TIBC/Ferritin/ %Sat    Component Value Date/Time   IRON 107 02/22/2016 1218   TIBC 386 04/23/2015 1200   FERRITIN 29.0 02/22/2016 1218   IRONPCTSAT 29.0  02/22/2016 1218        Assessment & Plan:  83 year old male with history of iron deficiency anemia, heme positive stools with gastritis, history of gastric angiodysplasias, history of colon polyps, history of diverticulosis, history of internal hemorrhoids who is here for follow-up.   1. Iron deficiency anemia -- history of erosive gastritis, gastric angiodysplasia. He remains on chronic anticoagulation. I recommend we continue the  pantoprazole 40 mg daily given his history of gastritis, angiodysplasias and chronic anticoagulation. We will continue Protonix 40 mg daily. Repeat blood counts and iron studies today. He has responded well to oral iron to this point.  2. Abdominal bloating/flatulence and intermittent loose stool -- likely irritable bowel versus bacterial overgrowth. I recommend we try rifaximin 550 mg 3 times a day 14 days. See below for hemorrhoidal symptoms.  3. Internal hemorrhoids with fecal smearing -- exacerbated likely by bowel habits and flatulence. We are treating with rifaximin. We discussed hemorrhoidal banding at length today including the elevated risk of bleeding in the setting of Eliquis. If symptoms do not respond rifaximin he is inclined to proceed with banding and except the small but increased risk of bleeding post banding.  He will return in 3 months, sooner if necessary 25 minutes spent with the patient today. Greater than 50% was spent in counseling and coordination of care with the patient

## 2016-09-09 ENCOUNTER — Encounter: Payer: Self-pay | Admitting: Internal Medicine

## 2016-09-11 DIAGNOSIS — G4733 Obstructive sleep apnea (adult) (pediatric): Secondary | ICD-10-CM | POA: Diagnosis not present

## 2016-09-19 ENCOUNTER — Other Ambulatory Visit: Payer: Self-pay | Admitting: Internal Medicine

## 2016-09-24 ENCOUNTER — Other Ambulatory Visit: Payer: Self-pay | Admitting: Internal Medicine

## 2016-10-11 DIAGNOSIS — G4733 Obstructive sleep apnea (adult) (pediatric): Secondary | ICD-10-CM | POA: Diagnosis not present

## 2016-10-15 ENCOUNTER — Encounter: Payer: Self-pay | Admitting: Internal Medicine

## 2016-10-18 ENCOUNTER — Other Ambulatory Visit: Payer: Self-pay | Admitting: Internal Medicine

## 2016-10-18 DIAGNOSIS — I10 Essential (primary) hypertension: Secondary | ICD-10-CM

## 2016-11-07 DIAGNOSIS — G4733 Obstructive sleep apnea (adult) (pediatric): Secondary | ICD-10-CM | POA: Diagnosis not present

## 2016-11-18 ENCOUNTER — Encounter: Payer: Self-pay | Admitting: Internal Medicine

## 2016-11-21 ENCOUNTER — Other Ambulatory Visit: Payer: Self-pay | Admitting: Internal Medicine

## 2016-11-21 ENCOUNTER — Ambulatory Visit (INDEPENDENT_AMBULATORY_CARE_PROVIDER_SITE_OTHER): Payer: PPO | Admitting: *Deleted

## 2016-11-21 DIAGNOSIS — I495 Sick sinus syndrome: Secondary | ICD-10-CM | POA: Diagnosis not present

## 2016-11-21 NOTE — Telephone Encounter (Signed)
Pt last saw Dr Caryl Comes 05/23/16, last labs on 09/06/16 Creat 1.07, age 83, weight 101.4kg, based on specified criteria pt is on appropriate dosage of Eliquis 5mg  BID.  Will refill rx.

## 2016-11-22 NOTE — Progress Notes (Signed)
Remote pacemaker transmission.   

## 2016-11-23 LAB — CUP PACEART REMOTE DEVICE CHECK
Battery Remaining Longevity: 111 mo
Battery Remaining Percentage: 95.5 %
Battery Voltage: 3.01 V
Brady Statistic AS VS Percent: 1 %
Brady Statistic RA Percent Paced: 99 %
Implantable Lead Implant Date: 20171002
Implantable Lead Implant Date: 20171002
Implantable Lead Location: 753860
Implantable Pulse Generator Implant Date: 20171002
Lead Channel Impedance Value: 410 Ohm
Lead Channel Impedance Value: 580 Ohm
Lead Channel Pacing Threshold Amplitude: 0.5 V
Lead Channel Pacing Threshold Pulse Width: 0.5 ms
Lead Channel Pacing Threshold Pulse Width: 0.5 ms
Lead Channel Sensing Intrinsic Amplitude: 12 mV
Lead Channel Sensing Intrinsic Amplitude: 2.6 mV
Lead Channel Setting Pacing Amplitude: 0.75 V
Lead Channel Setting Pacing Amplitude: 2 V
MDC IDC LEAD LOCATION: 753859
MDC IDC MSMT LEADCHNL RV PACING THRESHOLD AMPLITUDE: 0.5 V
MDC IDC PG SERIAL: 7951215
MDC IDC SESS DTM: 20180709085730
MDC IDC SET LEADCHNL RV PACING PULSEWIDTH: 0.5 ms
MDC IDC SET LEADCHNL RV SENSING SENSITIVITY: 2 mV
MDC IDC STAT BRADY AP VP PERCENT: 1 %
MDC IDC STAT BRADY AP VS PERCENT: 98 %
MDC IDC STAT BRADY AS VP PERCENT: 1 %
MDC IDC STAT BRADY RV PERCENT PACED: 1 %

## 2016-11-28 ENCOUNTER — Encounter: Payer: Self-pay | Admitting: Cardiology

## 2016-12-07 DIAGNOSIS — G4733 Obstructive sleep apnea (adult) (pediatric): Secondary | ICD-10-CM | POA: Diagnosis not present

## 2016-12-07 IMAGING — CR DG RIBS 2V*R*
4 series · 4 of 4 positions shown · non-contrast
Comparison: Chest radiograph - 07/17/2013; 05/07/2013; chest
CT-05/07/2013

CLINICAL DATA: Rib injury 4 days ago

EXAM:
RIGHT RIBS - 2 VIEW

[view not recorded (1 of 4)]
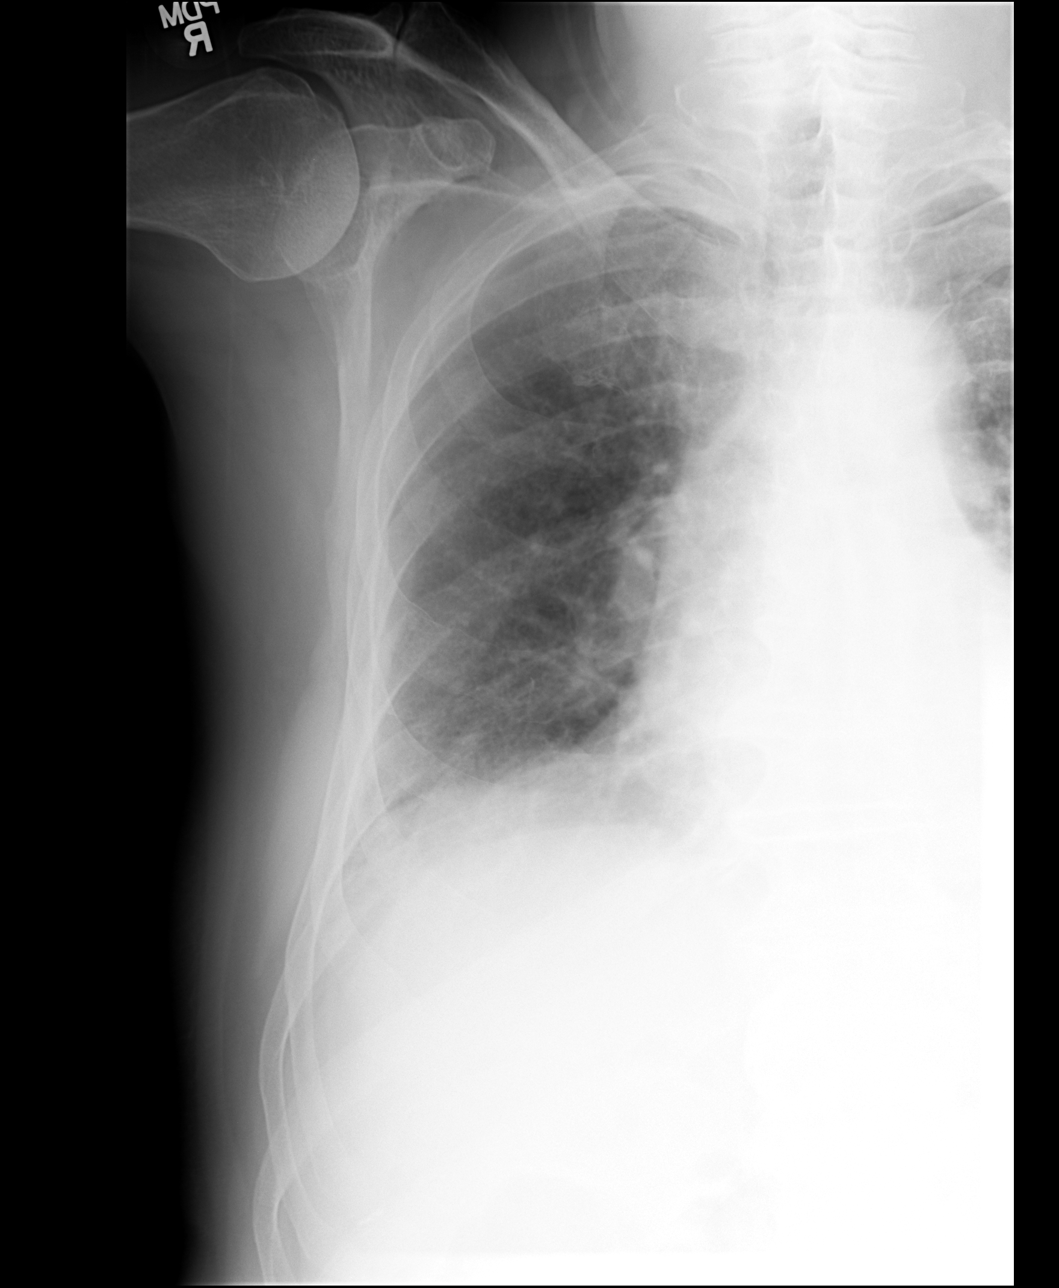

[view not recorded (2 of 4)]
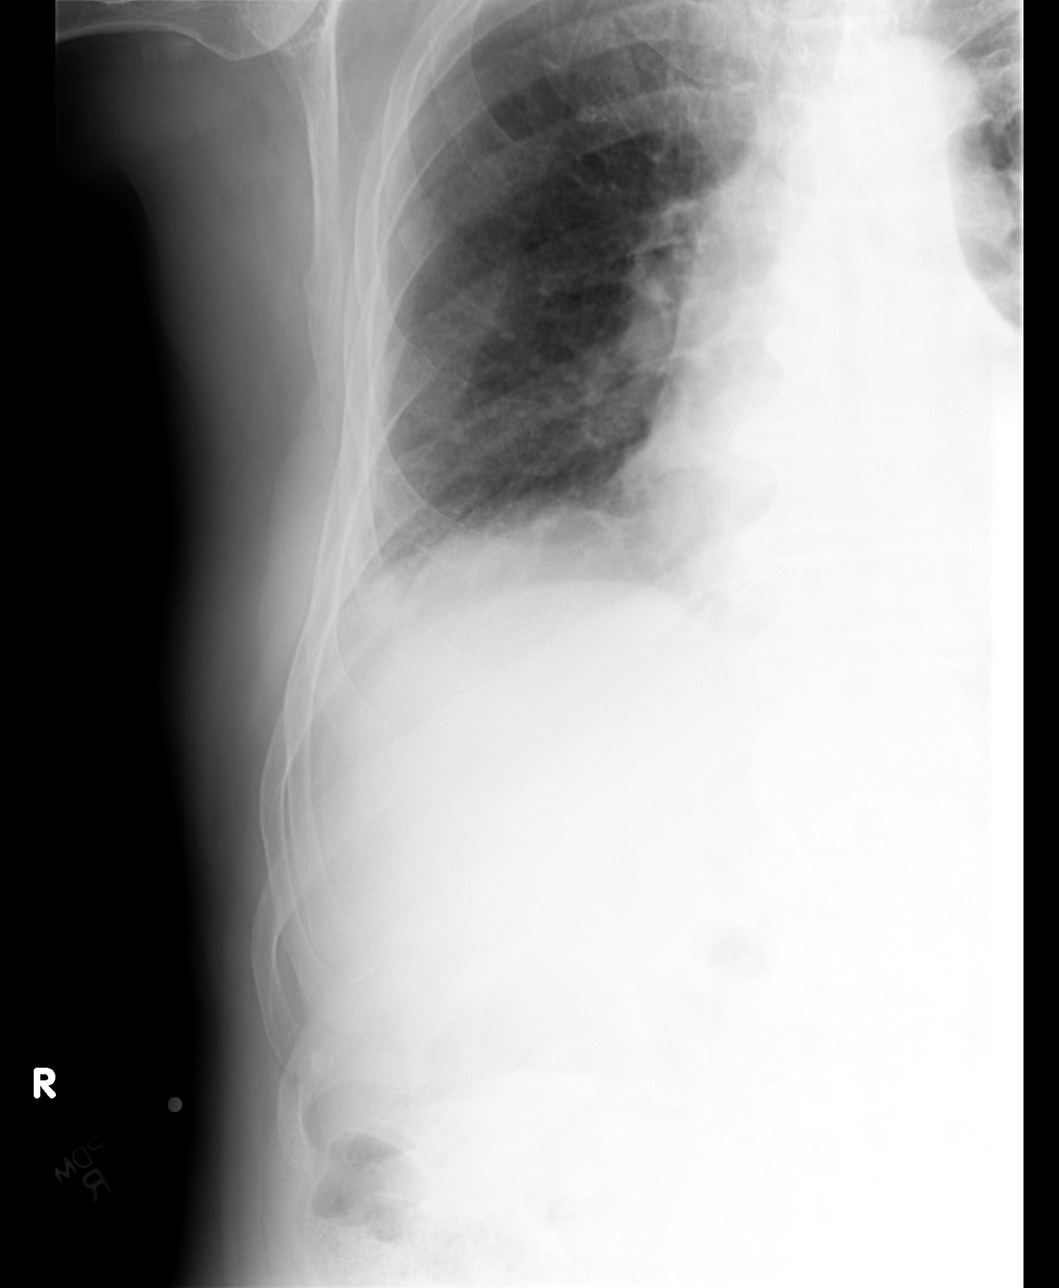

[view not recorded (3 of 4)]
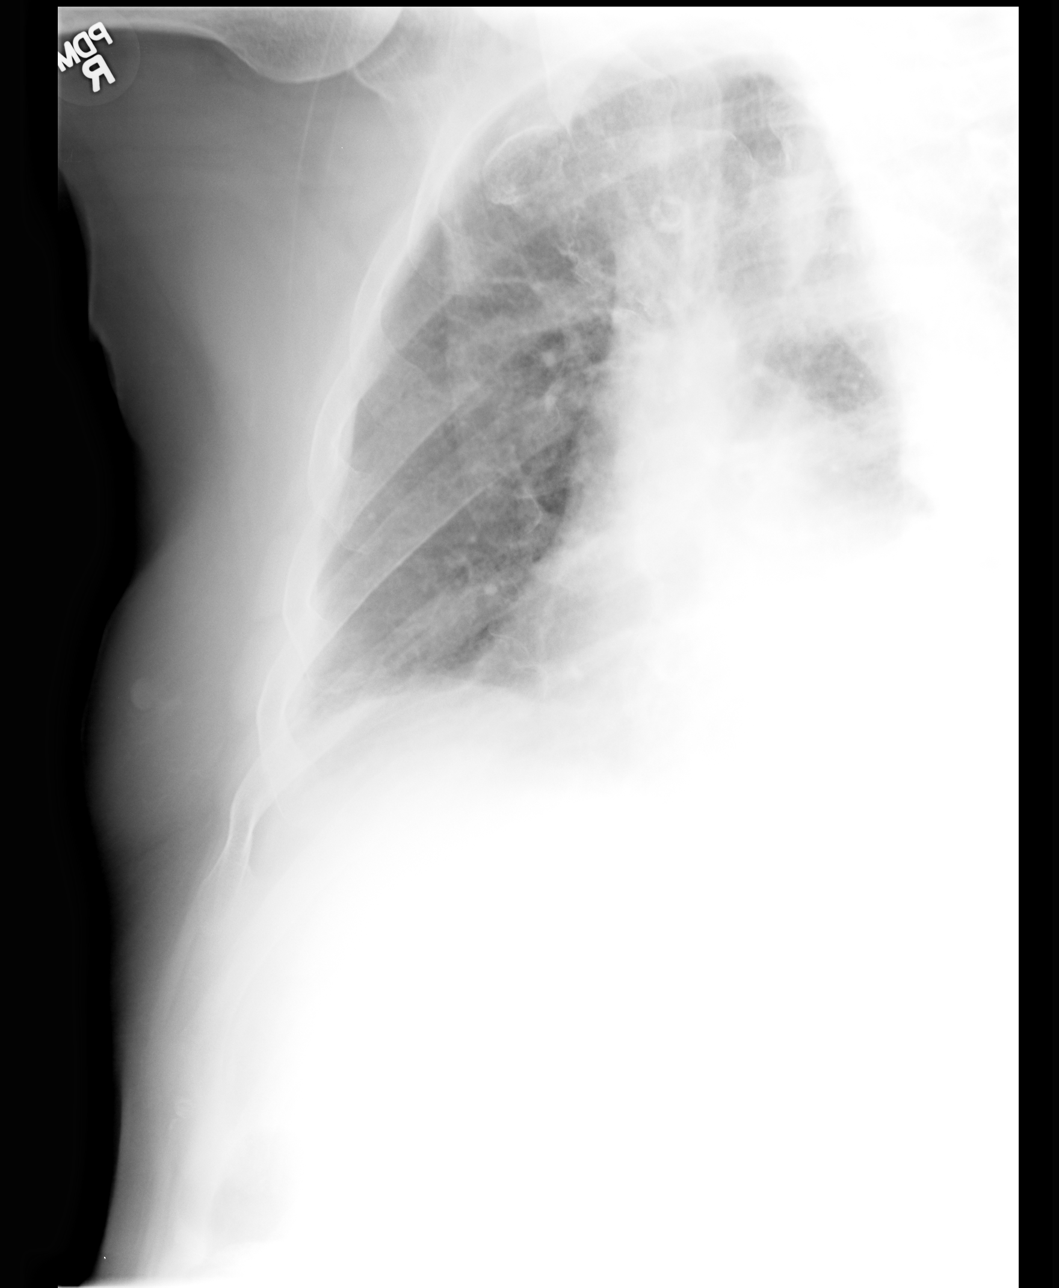

[view not recorded (4 of 4)]
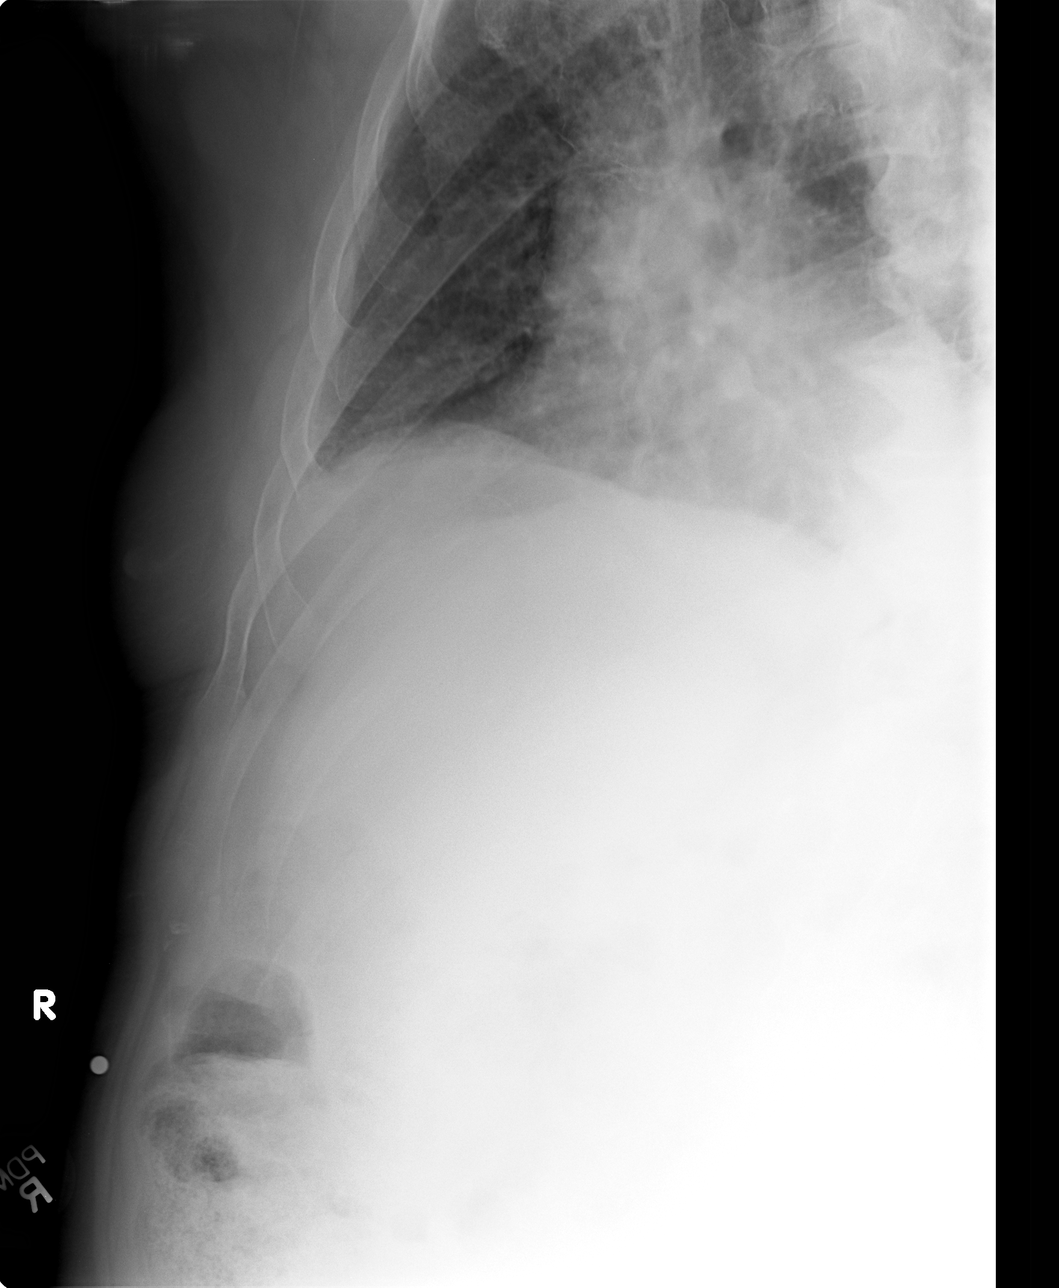

[4 of 4 positions shown; findings below may reference images not displayed]

FINDINGS: Query mildly displaced fracture involving the anterior aspect of the
right 4th rib. Otherwise, no definite displaced right-sided rib
fractures. Regional soft tissues appear normal. No radiopaque
foreign body.

Limited visualization adjacent thorax demonstrates right basilar
heterogeneous opacities favored to represent atelectasis. No
definite pleural effusion or pneumothorax. Grossly unchanged
enlarged cardiac silhouette and mediastinal contours.
IMPRESSION: Potential minimally displaced fracture involving the anterior aspect
of the right fourth rib. Correlation for point tenderness at this
location is recommended. No definite associated pleural effusion or
pneumothorax.

## 2016-12-08 ENCOUNTER — Other Ambulatory Visit (INDEPENDENT_AMBULATORY_CARE_PROVIDER_SITE_OTHER): Payer: PPO

## 2016-12-08 ENCOUNTER — Encounter: Payer: Self-pay | Admitting: Internal Medicine

## 2016-12-08 ENCOUNTER — Ambulatory Visit (INDEPENDENT_AMBULATORY_CARE_PROVIDER_SITE_OTHER): Payer: PPO | Admitting: Internal Medicine

## 2016-12-08 VITALS — BP 120/68 | HR 62 | Ht 68.0 in | Wt 223.5 lb

## 2016-12-08 DIAGNOSIS — Z86010 Personal history of colonic polyps: Secondary | ICD-10-CM

## 2016-12-08 DIAGNOSIS — Z1211 Encounter for screening for malignant neoplasm of colon: Secondary | ICD-10-CM

## 2016-12-08 DIAGNOSIS — D509 Iron deficiency anemia, unspecified: Secondary | ICD-10-CM

## 2016-12-08 DIAGNOSIS — R140 Abdominal distension (gaseous): Secondary | ICD-10-CM

## 2016-12-08 DIAGNOSIS — K580 Irritable bowel syndrome with diarrhea: Secondary | ICD-10-CM | POA: Diagnosis not present

## 2016-12-08 DIAGNOSIS — R143 Flatulence: Secondary | ICD-10-CM

## 2016-12-08 DIAGNOSIS — K219 Gastro-esophageal reflux disease without esophagitis: Secondary | ICD-10-CM | POA: Diagnosis not present

## 2016-12-08 LAB — CBC WITH DIFFERENTIAL/PLATELET
BASOS PCT: 0.5 % (ref 0.0–3.0)
Basophils Absolute: 0 10*3/uL (ref 0.0–0.1)
EOS PCT: 2.5 % (ref 0.0–5.0)
Eosinophils Absolute: 0.2 10*3/uL (ref 0.0–0.7)
HCT: 40.6 % (ref 39.0–52.0)
HEMOGLOBIN: 13.9 g/dL (ref 13.0–17.0)
LYMPHS ABS: 1.9 10*3/uL (ref 0.7–4.0)
Lymphocytes Relative: 25.9 % (ref 12.0–46.0)
MCHC: 34.3 g/dL (ref 30.0–36.0)
MCV: 99.6 fl (ref 78.0–100.0)
MONO ABS: 0.7 10*3/uL (ref 0.1–1.0)
Monocytes Relative: 9.7 % (ref 3.0–12.0)
Neutro Abs: 4.4 10*3/uL (ref 1.4–7.7)
Neutrophils Relative %: 61.4 % (ref 43.0–77.0)
Platelets: 179 10*3/uL (ref 150.0–400.0)
RBC: 4.08 Mil/uL — ABNORMAL LOW (ref 4.22–5.81)
RDW: 13.5 % (ref 11.5–15.5)
WBC: 7.2 10*3/uL (ref 4.0–10.5)

## 2016-12-08 LAB — IBC PANEL
IRON: 74 ug/dL (ref 42–165)
Saturation Ratios: 19.2 % — ABNORMAL LOW (ref 20.0–50.0)
Transferrin: 276 mg/dL (ref 212.0–360.0)

## 2016-12-08 LAB — FERRITIN: Ferritin: 40.5 ng/mL (ref 22.0–322.0)

## 2016-12-08 MED ORDER — BENEFIBER PO POWD: ORAL | 0 refills | Status: DC

## 2016-12-08 MED ORDER — RIFAXIMIN 550 MG PO TABS: 550.0000 mg | ORAL_TABLET | Freq: Three times a day (TID) | ORAL | 0 refills | Status: AC

## 2016-12-08 MED ORDER — PANTOPRAZOLE SODIUM 40 MG PO TBEC: DELAYED_RELEASE_TABLET | ORAL | 1 refills | Status: DC

## 2016-12-08 NOTE — Telephone Encounter (Addendum)
Dr Hilarie Fredrickson- This patient received a full xifaxan course given entirerly through samples back in May. His insurance again is going to charge 500 dollars out of pocket for this medication. We do not have that supply to continue to give full courses of xifaxan samples. Any advice?

## 2016-12-08 NOTE — Progress Notes (Signed)
Subjective:    Patient ID: Donald [name], male    DOB: [birth year], 83 y.o.   MRN: [id number]  HPI Donald [name] is an 83 yo male with PMH of IDA, heme + stools with gastritis, hx of gastric angiodysplasia, hx of colon polyps, History of diverticulosis, history of internal hemorrhoids, and IBS and possible SIBO here for follow-up. He was last seen in the office on 09/06/2016. He has history of atrial fibrillation on Eliquis, CAD with pacemaker and hypertension.  After his last visit he was treated with rifaximin 550 mg 3 times a day 14 days. He reports that for 1 week after therapy his abdominal bloating and flatulence was significantly better. Slowly the symptoms have recurred but are not to the severity as they were prior to treatment. His stools have varied and are still loose at times. He's had some fecal smearing which comes and goes. There is been no change in his stool characteristics. On 2 occasions since last visit he is seen since Blood with wiping but no blood on or in his stool or in the toilet. He denies abdominal pain. He continues oral iron and with this his stools are dark. Appetite has remained good. Energy levels fluctuate and when he overexerts himself he can feel chest pressure and shortness of breath.   Review of Systems As per history of present illness, otherwise negative  Current Medications, Allergies, Past Medical History, Past Surgical History, Family History and Social History were reviewed in [employer] record.     Objective:   Physical Exam BP 120/68   Pulse 62   Ht 5\' 8"  (1.727 m)   Wt 223 lb 8 oz (101.4 kg)   BMI 33.98 kg/m  Constitutional: Well-developed and well-nourished. No distress. HEENT: Normocephalic and atraumatic. Oropharynx is clear and moist. Conjunctivae are normal.  No scleral icterus. Neck: Neck supple. Trachea midline. Cardiovascular: Normal rate, regular rhythm and intact distal pulses.  Pulmonary/chest: Effort  normal and breath sounds normal. No wheezing, rales or rhonchi. Abdominal: Soft, nontender, nondistended. Bowel sounds active throughout.  Extremities: no clubbing, cyanosis, trace b/l LE edema Neurological: Alert and oriented to person place and time. Skin: Skin is warm and dry. Psychiatric: Normal mood and affect. Behavior is normal.  CBC    Component Value Date/Time   WBC 7.2 12/08/2016 0945   RBC 4.08 (L) 12/08/2016 0945   HGB 13.9 12/08/2016 0945   HCT 40.6 12/08/2016 0945   PLT 179.0 12/08/2016 0945   MCV 99.6 12/08/2016 0945   MCH 32.8 02/15/2016 0314   MCHC 34.3 12/08/2016 0945   RDW 13.5 12/08/2016 0945   LYMPHSABS 1.9 12/08/2016 0945   MONOABS 0.7 12/08/2016 0945   EOSABS 0.2 12/08/2016 0945   BASOSABS 0.0 12/08/2016 0945   CMP     Component Value Date/Time   NA 140 09/06/2016 0927   K 5.2 (H) 09/06/2016 0927   CL 104 09/06/2016 0927   CO2 31 09/06/2016 0927   GLUCOSE 87 09/06/2016 0927   BUN 27 (H) 09/06/2016 0927   CREATININE 1.07 09/06/2016 0927   CREATININE 1.04 06/10/2015 1038   CALCIUM 9.8 09/06/2016 0927   PROT 7.1 09/06/2016 0927   ALBUMIN 4.3 09/06/2016 0927   AST 18 09/06/2016 0927   ALT 14 09/06/2016 0927   ALKPHOS 69 09/06/2016 0927   BILITOT 0.7 09/06/2016 0927   GFRNONAA >60 02/15/2016 0314   GFRAA >60 02/15/2016 0314   Iron/TIBC/Ferritin/ %Sat    Component Value Date/Time  IRON 74 12/08/2016 0945   TIBC 386 04/23/2015 1200   FERRITIN 40.5 12/08/2016 0945   IRONPCTSAT 19.2 (L) 12/08/2016 0945      Assessment & Plan:  83 yo male with PMH of IDA, heme + stools with gastritis, hx of gastric angiodysplasia, hx of colon polyps, History of diverticulosis, history of internal hemorrhoids, and IBS and possible SIBO here for follow-up.   1. IDA -- History of erosive gastritis and angiodysplastic lesions. He also has history of colonic polyps and had an incomplete colonoscopy in December 2016, see below. Repeat CBC and iron studies performed  today. Continue oral iron supplementation. Continue Protonix 40 mg once daily.   2. IBS with loose stool/abdominal bloating and flatulence/possible SIBO -- favorable response to rifaximin though duration of response was somewhat limited. I recommended retreatment with rifaximin 550 mg 3 times a day 14 days. In my experience retreatment can lead to longer-lasting effect than initial treatment alone. I'm also going to have him begin Benefiber 1 tablespoon daily to help bulk the stool which will hopefully help with fecal smearing and also improve his loose stools.  3. Internal hemorrhoids -- causing scant intermittent red blood per rectum and fecal smearing. We are treating with Benefiber as above. He would be a candidate for hemorrhoidal banding, but chronic Eliquis would be an issue.  We would likely need to hold this for some length of time post-banding to lower the risk of bleeding.  We would need to discuss this with Dr. Caryl Comes regarding Eliquis if we pursue banding.  4. History of incomplete colonoscopy -- he has a personal history of colon polyps, and adenomatous polyp was removed 10 mm at last colonoscopy. This exam was incomplete due to tortuosity. Dr. Fuller Plan noted that he was able to reach the hepatic flexure. We discussed repeat colonoscopy versus another screening/surveillance test. After this discussion we will pursue virtual colonoscopy which will allow him to continue Eliquis without interruption. He understands that if this shows a polypoid lesion he would need colonoscopy.  6 month follow-up, sooner if needed 25 minutes spent with the patient today. Greater than 50% was spent in counseling and coordination of care with the patient

## 2016-12-08 NOTE — Telephone Encounter (Signed)
Please contact our 3 drug reps for rifaximin and obtain samples for Donald [name] 2 week treatment course Thanks JMP

## 2016-12-08 NOTE — Patient Instructions (Addendum)
You have been scheduled for a virtual colonoscopy at [facility] on August 3rd at 8:00am.  Please arrive at 7:40am.  At least four days prior to the procedure, you will need to pick up the prep for this procedure at [address] If you have any questions, please call [phone].  We have sent the following medications to your pharmacy for you to pick up at your convenience: Xifaxan  Continue taking your prontonix  Please purchase the following medications over the counter and take as directed: Iron Benefiber  (take 1 teaspoon daily. If tolerated increase to 1 tablespoon daily).  Your physician has requested that you go to the basement for lab work before leaving today.  Please follow up with Dr. Raquel James in 6 months.

## 2016-12-12 DIAGNOSIS — L738 Other specified follicular disorders: Secondary | ICD-10-CM | POA: Diagnosis not present

## 2016-12-12 DIAGNOSIS — Z85828 Personal history of other malignant neoplasm of skin: Secondary | ICD-10-CM | POA: Diagnosis not present

## 2016-12-12 DIAGNOSIS — L570 Actinic keratosis: Secondary | ICD-10-CM | POA: Diagnosis not present

## 2016-12-12 DIAGNOSIS — L821 Other seborrheic keratosis: Secondary | ICD-10-CM | POA: Diagnosis not present

## 2016-12-12 DIAGNOSIS — L918 Other hypertrophic disorders of the skin: Secondary | ICD-10-CM | POA: Diagnosis not present

## 2016-12-12 DIAGNOSIS — D225 Melanocytic nevi of trunk: Secondary | ICD-10-CM | POA: Diagnosis not present

## 2016-12-12 DIAGNOSIS — C44519 Basal cell carcinoma of skin of other part of trunk: Secondary | ICD-10-CM | POA: Diagnosis not present

## 2016-12-16 ENCOUNTER — Ambulatory Visit
Admission: RE | Admit: 2016-12-16 | Discharge: 2016-12-16 | Disposition: A | Discharge status: Home / Self Care | Payer: PPO | Source: Ambulatory Visit | Attending: Internal Medicine | Admitting: Internal Medicine

## 2016-12-16 DIAGNOSIS — Z1211 Encounter for screening for malignant neoplasm of colon: Secondary | ICD-10-CM

## 2016-12-16 DIAGNOSIS — N281 Cyst of kidney, acquired: Secondary | ICD-10-CM | POA: Diagnosis not present

## 2016-12-16 DIAGNOSIS — Z86010 Personal history of colonic polyps: Secondary | ICD-10-CM

## 2017-01-06 DIAGNOSIS — G4733 Obstructive sleep apnea (adult) (pediatric): Secondary | ICD-10-CM | POA: Diagnosis not present

## 2017-02-02 ENCOUNTER — Encounter: Payer: Self-pay | Admitting: Pulmonary Disease

## 2017-02-17 NOTE — Progress Notes (Signed)
Pre visit review using our clinic review tool, if applicable. No additional management support is needed unless otherwise documented below in the visit note. 

## 2017-02-17 NOTE — Progress Notes (Signed)
Subjective:   Donald [name] is a 83 y.o. male who presents for Medicare Annual/Subsequent preventive examination.  Review of Systems:  No ROS.  Medicare Wellness Visit. Additional risk factors are reflected in the social history.  Cardiac Risk Factors include: advanced age (>62men, >18 women);dyslipidemia;hypertension;male gender Sleep patterns: feels rested on waking, gets up 1-2 times nightly to void and sleeps 8 hours nightly.    Home Safety/Smoke Alarms: Feels safe in home. Smoke alarms in place.  Living environment; residence and Firearm Safety: 1-story house/ trailer, no firearms, firearms stored safely. Lives with wife, no needs for DME, good support system  Seat Belt Safety/Bike Helmet: Wears seat belt.      Objective:    Vitals: BP 126/60   Pulse (!) 59   Resp 20   Ht 5\' 8"  (1.727 m)   Wt 220 lb (99.8 kg)   SpO2 98%   BMI 33.45 kg/m   Body mass index is 33.45 kg/m.  Tobacco History  Smoking Status  . Former Smoker  . Packs/day: 0.00  . Years: 40.00  . Types: Cigarettes, Cigars  . Quit date: 09/18/2008  Smokeless Tobacco  . Former Systems developer  . Types: Chew    Comment: Has Occasional Cigar/ quit      Counseling given: Not Answered   Past Medical History:  Diagnosis Date  . Abnormal CT scan, stomach 02/2014   Thickening of gastric fundus and cardia.  gastritis on EGD 03/2014  . Allergic rhinitis due to pollen   . Arthropathy, unspecified, site unspecified   . Atherosclerosis    a. Noted by abdominal CT 02/2014 (h/o normal nuc 2011).  . Atrial flutter ([place]) 02/24/14   s/p ablation 11/15  . Cardiomyopathy ([place])    tachy mediated - a. TEE (10/15):  EF 30%;  b. Echo after NSR restored (10/15):  mild LVH, EF 55-60%, mild AS, mild AI, mild MR, mild to mod LAE, mild RAE  . Diverticulosis    severe in descending, sigmoid colon.   . ED (erectile dysfunction)   . Esophageal stricture 03/2014   traversable with endoscope. not dilated.   . Gastric AVM   . GERD  (gastroesophageal reflux disease) 03/2014   small HH and gastritis on EGD  . GI bleed   . Habitual alcohol use   . Hemorrhoids   . Hiatal hernia   . Hyperlipidemia   . Hypertension   . Obesity   . OSA on CPAP   . PAF (paroxysmal atrial fibrillation) ([place])   . Prostatitis, unspecified   . Sinus bradycardia   . Skin cancer    basal and squamous cell   Past Surgical History:  Procedure Laterality Date  . ABLATION  03/19/2014   RFCA of atrial flutter by Dr Caryl Comes  . ATRIAL FLUTTER ABLATION N/A 03/19/2014   Procedure: ATRIAL FLUTTER ABLATION;  Surgeon: Deboraha Sprang, MD;  Location: [facility] CATH LAB;  Service: Cardiovascular;  Laterality: N/A;  . CARDIOVERSION N/A 02/24/2014   Procedure: CARDIOVERSION;  Surgeon: Lelon Perla, MD;  Location: [place];  Service: Cardiovascular;  Laterality: N/A;  . CARDIOVERSION Right 09/10/2014   Procedure: CARDIOVERSION;  Surgeon: Deboraha Sprang, MD;  Location: [facility] CATH LAB;  Service: Cardiovascular;  Laterality: Right;  . COLONOSCOPY N/A 04/23/2015   Procedure: COLONOSCOPY;  Surgeon: Ladene Artist, MD;  Location: [facility] ENDOSCOPY;  Service: Endoscopy;  Laterality: N/A;  . ENTEROSCOPY N/A 06/12/2015   Procedure: ENTEROSCOPY;  Surgeon: Ladene Artist, MD;  Location: [facility] ENDOSCOPY;  Service: Endoscopy;  Laterality: N/A;  . EP IMPLANTABLE DEVICE N/A 02/15/2016   Procedure: Pacemaker Implant;  Surgeon: Evans Lance, MD;  Location: [place] CV LAB;  Service: Cardiovascular;  Laterality: N/A;  . ESOPHAGOGASTRODUODENOSCOPY N/A 04/09/2014   Procedure: ESOPHAGOGASTRODUODENOSCOPY (EGD);  Surgeon: Inda Castle, MD;  Location: [place];  Service: Endoscopy;  Laterality: N/A;  . ESOPHAGOGASTRODUODENOSCOPY N/A 04/23/2015   Procedure: ESOPHAGOGASTRODUODENOSCOPY (EGD);  Surgeon: Ladene Artist, MD;  Location: [facility] ENDOSCOPY;  Service: Endoscopy;  Laterality: N/A;  . HERNIA REPAIR    . INGUINAL HERNIA REPAIR     right  . INGUINAL HERNIA REPAIR     left  . ORIF  fracture of the elbow    . SKIN CANCER EXCISION  05/21/12   Squamous cell ca  . SQUAMOUS CELL CARCINOMA EXCISION    . TEE WITHOUT CARDIOVERSION N/A 02/24/2014   Procedure: TRANSESOPHAGEAL ECHOCARDIOGRAM (TEE);  Surgeon: Lelon Perla, MD;  Location: [facility] ENDOSCOPY;  Service: Cardiovascular;  Laterality: N/A;  . TONSILLECTOMY     Family History  Problem Relation Age of Onset  . Cancer Mother        ovarian  . Other Father        renal disease- grief over loss of spouse  . Cancer Brother        prostate- died of hematologic disorder 2nd to chemo  . Pulmonary fibrosis Brother   . Diabetes Neg Hx   . Coronary artery disease Neg Hx   . Colon cancer Neg Hx   . Stomach cancer Neg Hx   . Rectal cancer Neg Hx    History  Sexual Activity  . Sexual activity: Not on file    Outpatient Encounter Prescriptions as of 02/20/2017  Medication Sig  . atorvastatin (LIPITOR) 40 MG tablet TAKE 1 TABLET ONCE DAILY.  Marland Kitchen ELIQUIS 5 MG TABS tablet TAKE 1 TABLET TWICE DAILY.  . ferrous sulfate 325 (65 FE) MG tablet Take 1 tablet (325 mg total) by mouth daily with breakfast.  . fluticasone (FLONASE) 50 MCG/ACT nasal spray 1 spray in each nostril daily as needed for stuffiness  . furosemide (LASIX) 40 MG tablet TAKE 1 TABLET EACH DAY.  Marland Kitchen loratadine (CLARITIN) 10 MG tablet Take 10 mg by mouth daily as needed for allergies.   Marland Kitchen MAGNESIUM PO Take 4 tablets by mouth 2 (two) times daily.   . metoprolol (LOPRESSOR) 50 MG tablet TAKE 1 TABLET TWICE DAILY.  . nitroGLYCERIN (NITROSTAT) 0.4 MG SL tablet Place 1 tablet (0.4 mg total) under the tongue every 5 (five) minutes as needed for chest pain.  . NON FORMULARY c pap  . pantoprazole (PROTONIX) 40 MG tablet TAKE 1 TABLET EACH DAY.  Marland Kitchen Wheat Dextrin (BENEFIBER) POWD Take 1 teaspoon daily.  If tolerated, increase to 1 tablespoon daily  . isosorbide mononitrate (IMDUR) 60 MG 24 hr tablet Take 1 tablet (60 mg total) by mouth daily.  Marland Kitchen losartan (COZAAR) 50 MG tablet  Take 1 tablet (50 mg total) by mouth daily.   No facility-administered encounter medications on file as of 02/20/2017.     Activities of Daily Living In your present state of health, do you have any difficulty performing the following activities: 02/20/2017  Hearing? N  Vision? N  Difficulty concentrating or making decisions? N  Walking or climbing stairs? N  Dressing or bathing? N  Doing errands, shopping? N  Preparing Food and eating ? N  Using the Toilet? N  In the past six months,  have you accidently leaked urine? N  Do you have problems with loss of bowel control? N  Managing your Medications? N  Managing your Finances? N  Housekeeping or managing your Housekeeping? N  Some recent data might be hidden    Patient Care Team: Janith Lima, MD as PCP - General (Internal Medicine) Franchot Gallo, MD (Urology) [name], [name], MD (Dermatology) Monna Fam, MD as Consulting Physician (Ophthalmology) Deboraha Sprang, MD as Consulting Physician (Cardiology)   Assessment:    Physical assessment deferred to PCP.  Exercise Activities and Dietary recommendations Current Exercise Habits: Home exercise routine, Type of exercise: walking (water aerobics), Time (Minutes): 50, Frequency (Times/Week): 6, Weekly Exercise (Minutes/Week): 300, Intensity: Mild, Exercise limited by: None identified  Diet (meal preparation, eat out, water intake, caffeinated beverages, dairy products, fruits and vegetables): in general, a "healthy" diet  , well balanced, eats a variety of fruits and vegetables daily, limits salt, fat/cholesterol, sugar, caffeine, drinks 6 glasses of water daily.  Reviewed heart healthy diet, encouraged patient to increase daily water intake. Diet education was attached to patient's AVS.   Goals    . lose weight          Continue to increase the amount of physical activity, eat healthy, enjoy life and family.     . Weight < 200 lb (90.719 kg)          1. Will make apt  to Dr in GI to fup on GERDs 2. Would like to lose weight to help heart; help  Would like to have more energy; Portion control; Eat a big breakfast; lunch; big supper.       Fall Risk Fall Risk  02/20/2017 08/09/2016 09/15/2014  Falls in the past year? No No No   Depression Screen PHQ 2/9 Scores 02/20/2017 08/09/2016 09/15/2014  PHQ - 2 Score 0 0 0  PHQ- 9 Score 0 - -    Cognitive Function MMSE - Mini Mental State Exam 02/20/2017 09/15/2014  Not completed: - Unable to complete  Orientation to time 5 -  Orientation to Place 5 -  Registration 3 -  Attention/ Calculation 5 -  Recall 2 -  Language- name 2 objects 2 -  Language- repeat 1 -  Language- follow 3 step command 3 -  Language- read & follow direction 1 -  Write a sentence 1 -  Copy design 1 -  Total score 29 -        Immunization History  Administered Date(s) Administered  . H1N1 04/28/2008  . Influenza Split 02/23/2011, 02/07/2012  . Influenza Whole 02/07/2008, 02/04/2009  . Influenza, High Dose Seasonal PF 01/20/2015, 03/07/2016, 02/20/2017  . Influenza,inj,Quad PF,6+ Mos 02/07/2013, 02/03/2014  . Pneumococcal Conjugate-13 05/07/2013  . Pneumococcal Polysaccharide-23 03/20/2007, 03/07/2016  . Td 04/11/2008  . Zoster 01/27/2010   Screening Tests Health Maintenance  Topic Date Due  . INFLUENZA VACCINE  12/14/2016  . TETANUS/TDAP  04/11/2018  . PNA vac Low Risk Adult  Completed      Plan:    Continue doing brain stimulating activities (puzzles, reading, adult coloring books, staying active) to keep memory sharp.   Continue to eat heart healthy diet (full of fruits, vegetables, whole grains, lean protein, water--limit salt, fat, and sugar intake) and increase physical activity as tolerated.  I have personally reviewed and noted the following in the patient's chart:   . Medical and social history . Use of alcohol, tobacco or illicit drugs  . Current medications and supplements . Functional  ability and  status . Nutritional status . Physical activity . Advanced directives . List of other physicians . Vitals . Screenings to include cognitive, depression, and falls . Referrals and appointments  In addition, I have reviewed and discussed with patient certain preventive protocols, quality metrics, and best practice recommendations. A written personalized care plan for preventive services as well as general preventive health recommendations were provided to patient.     Michiel Cowboy, RN  02/20/2017

## 2017-02-20 ENCOUNTER — Ambulatory Visit (INDEPENDENT_AMBULATORY_CARE_PROVIDER_SITE_OTHER): Payer: PPO | Admitting: *Deleted

## 2017-02-20 ENCOUNTER — Telehealth: Payer: Self-pay | Admitting: Internal Medicine

## 2017-02-20 ENCOUNTER — Telehealth: Payer: Self-pay | Admitting: Cardiology

## 2017-02-20 VITALS — BP 126/60 | HR 59 | Resp 20 | Ht 68.0 in | Wt 220.0 lb

## 2017-02-20 DIAGNOSIS — I495 Sick sinus syndrome: Secondary | ICD-10-CM | POA: Diagnosis not present

## 2017-02-20 DIAGNOSIS — Z23 Encounter for immunization: Secondary | ICD-10-CM | POA: Diagnosis not present

## 2017-02-20 DIAGNOSIS — Z0000 Encounter for general adult medical examination without abnormal findings: Secondary | ICD-10-CM

## 2017-02-20 NOTE — Telephone Encounter (Signed)
Spoke w/ pt and attempted to help him send a manual transmission w/ his home monitor. After one unsuccessful attempt I instructed him to call tech support. Pt verbalized understanding.

## 2017-02-20 NOTE — Progress Notes (Signed)
Medical screening examination/treatment/procedure(s) were performed by non-physician practitioner and as supervising physician I was immediately available for consultation/collaboration. I agree with above. Elizabeth A Crawford, MD 

## 2017-02-20 NOTE — Telephone Encounter (Signed)
New message      Calling to see if you received his remote transmission

## 2017-02-20 NOTE — Telephone Encounter (Signed)
LMOVM reminding pt to send remote transmission.   

## 2017-02-20 NOTE — Patient Instructions (Addendum)
Continue doing brain stimulating activities (puzzles, reading, adult coloring books, staying active) to keep memory sharp.   Continue to eat heart healthy diet (full of fruits, vegetables, whole grains, lean protein, water--limit salt, fat, and sugar intake) and increase physical activity as tolerated.   [name] , Thank you for taking time to come for your Medicare Wellness Visit. I appreciate your ongoing commitment to your health goals. Please review the following plan we discussed and let me know if I can assist you in the future.   These are the goals we discussed: Goals    . lose weight          Continue to increase the amount of physical activity, eat healthy, enjoy life and family.     . Weight < 200 lb (90.719 kg)          1. Will make apt to Dr in GI to fup on GERDs 2. Would like to lose weight to help heart; help  Would like to have more energy; Portion control; Eat a big breakfast; lunch; big supper.        This is a list of the screening recommended for you and due dates:  Health Maintenance  Topic Date Due  . Flu Shot  12/14/2016  . Tetanus Vaccine  04/11/2018  . Pneumonia vaccines  Completed   Influenza Virus Vaccine injection What is this medicine? INFLUENZA VIRUS VACCINE (in floo EN zuh VAHY ruhs vak SEEN) helps to reduce the risk of getting influenza also known as the flu. The vaccine only helps protect you against some strains of the flu. This medicine may be used for other purposes; ask your health care provider or pharmacist if you have questions. COMMON BRAND NAME(S): Afluria, Agriflu, Alfuria, FLUAD, Fluarix, Fluarix Quadrivalent, Flublok, Flublok Quadrivalent, FLUCELVAX, Flulaval, Fluvirin, Fluzone, Fluzone High-Dose, Fluzone Intradermal What should I tell my health care provider before I take this medicine? They need to know if you have any of these conditions: -bleeding disorder like hemophilia -fever or infection -Guillain-Barre syndrome or other  neurological problems -immune system problems -infection with the human immunodeficiency virus (HIV) or AIDS -low blood platelet counts -multiple sclerosis -an unusual or allergic reaction to influenza virus vaccine, latex, other medicines, foods, dyes, or preservatives. Different brands of vaccines contain different allergens. Some may contain latex or eggs. Talk to your doctor about your allergies to make sure that you get the right vaccine. -pregnant or trying to get pregnant -breast-feeding How should I use this medicine? This vaccine is for injection into a muscle or under the skin. It is given by a health care professional. A copy of Vaccine Information Statements will be given before each vaccination. Read this sheet carefully each time. The sheet may change frequently. Talk to your healthcare provider to see which vaccines are right for you. Some vaccines should not be used in all age groups. Overdosage: If you think you have taken too much of this medicine contact a poison control center or emergency room at once. NOTE: This medicine is only for you. Do not share this medicine with others. What if I miss a dose? This does not apply. What may interact with this medicine? -chemotherapy or radiation therapy -medicines that lower your immune system like etanercept, anakinra, infliximab, and adalimumab -medicines that treat or prevent blood clots like warfarin -phenytoin -steroid medicines like prednisone or cortisone -theophylline -vaccines This list may not describe all possible interactions. Give your health care provider a list of all the  medicines, herbs, non-prescription drugs, or dietary supplements you use. Also tell them if you smoke, drink alcohol, or use illegal drugs. Some items may interact with your medicine. What should I watch for while using this medicine? Report any side effects that do not go away within 3 days to your doctor or health care professional. Call your  health care provider if any unusual symptoms occur within 6 weeks of receiving this vaccine. You may still catch the flu, but the illness is not usually as bad. You cannot get the flu from the vaccine. The vaccine will not protect against colds or other illnesses that may cause fever. The vaccine is needed every year. What side effects may I notice from receiving this medicine? Side effects that you should report to your doctor or health care professional as soon as possible: -allergic reactions like skin rash, itching or hives, swelling of the face, lips, or tongue Side effects that usually do not require medical attention (report to your doctor or health care professional if they continue or are bothersome): -fever -headache -muscle aches and pains -pain, tenderness, redness, or swelling at the injection site -tiredness This list may not describe all possible side effects. Call your doctor for medical advice about side effects. You may report side effects to FDA at 1-800-FDA-1088. Where should I keep my medicine? The vaccine will be given by a health care professional in a clinic, pharmacy, doctor's office, or other health care setting. You will not be given vaccine doses to store at home. NOTE: This sheet is a summary. It may not cover all possible information. If you have questions about this medicine, talk to your doctor, pharmacist, or health care provider.  2018 Elsevier/Gold Standard (2014-11-21 10:07:28)  Protein Content in Foods Generally, most healthy people need around 50 grams of protein each day. Depending on your overall health, you may need more or less protein in your diet. Talk to your health care provider or dietitian about how much protein you need. See the following list for the protein content of some common foods. High-protein foods High-protein foods contain 4 grams (4 g) or more of protein per serving. They include:  Beef, ground sirloin (cooked) - 3 oz have 24 g of  protein.  Cheese (hard) - 1 oz has 7 g of protein.  Chicken breast, boneless and skinless (cooked) - 3 oz have 13.4 g of protein.  Cottage cheese - 1/2 cup has 13.4 g of protein.  Egg - 1 egg has 6 g of protein.  Fish, filet (cooked) - 1 oz has 6-7 g of protein.  Garbanzo beans (canned or cooked) - 1/2 cup has 6-7 g of protein.  Kidney beans (canned or cooked) - 1/2 cup has 6-7 g of protein.  Lamb (cooked) - 3 oz has 24 g of protein.  Milk - 1 cup (8 oz) has 8 g of protein.  Nuts (peanuts, pistachios, almonds) - 1 oz has 6 g of protein.  Peanut butter - 1 oz has 7-8 g of protein.  Pork tenderloin (cooked) - 3 oz has 18.4 g of protein.  Pumpkin seeds - 1 oz has 8.5 g of protein.  Soybeans (roasted) - 1 oz has 8 g of protein.  Soybeans (cooked) - 1/2 cup has 11 g of protein.  Soy milk - 1 cup (8 oz) has 5-10 g of protein.  Soy or vegetable patty - 1 patty has 11 g of protein.  Sunflower seeds - 1 oz has 5.5 g  of protein.  Tofu (firm) - 1/2 cup has 20 g of protein.  Tuna (canned in water) - 3 oz has 20 g of protein.  Yogurt - 6 oz has 8 g of protein.  Low-protein foods Low-protein foods contain 3 grams (3 g) or less of protein per serving. They include:  Beets (raw or cooked) - 1/2 cup has 1.5 g of protein.  Bran cereal - 1/2 cup has 2-3 g of protein.  Bread - 1 slice has 2.5 g of protein.  Broccoli (raw or cooked) - 1/2 cup has 2 g of protein.  Collard greens (raw or cooked) - 1/2 cup has 2 g of protein.  Corn (fresh or cooked) - 1/2 cup has 2 g of protein.  Cream cheese - 1 oz has 2 g of protein.  Creamer (half-and-half) - 1 oz has 1 g of protein.  Flour tortilla - 1 tortilla has 2.5 g of protein  Frozen yogurt - 1/2 cup has 3 g of protein.  Fruit or vegetable juice - 1/2 cup has 1 g of protein.  Green beans (raw or cooked) - 1/2 cup has 1 g of protein.  Green peas (canned) - 1/2 cup has 3.5 g of protein.  Muffins - 1 small muffin (2 oz) has 3 g  of protein.  Oatmeal (cooked) - 1/2 cup has 3 g of protein.  Potato (baked with skin) - 1 medium potato has 3 g of protein.  Rice (cooked) - 1/2 cup has 2.5-3.5 g of protein.  Sour cream - 1/2 cup has 2.5 g of protein.  Spinach (cooked) - 1/2 cup has 3 g of protein.  Squash (cooked) - 1/2 cup has 1.5 g of protein.  Actual amounts of protein may be different depending on processing. Talk with your health care provider or dietitian about what foods are recommended for you. This information is not intended to replace advice given to you by your health care provider. Make sure you discuss any questions you have with your health care provider. Document Released: 08/01/2015 Document Revised: 01/11/2016 Document Reviewed: 01/11/2016 Elsevier Interactive Patient Education  [employer].

## 2017-02-21 ENCOUNTER — Encounter: Payer: PPO | Admitting: Internal Medicine

## 2017-02-21 ENCOUNTER — Encounter: Payer: Self-pay | Admitting: Internal Medicine

## 2017-02-21 NOTE — Progress Notes (Signed)
Remote pacemaker transmission.   

## 2017-02-24 ENCOUNTER — Encounter: Payer: Self-pay | Admitting: Cardiology

## 2017-02-24 ENCOUNTER — Encounter: Payer: Self-pay | Admitting: Pulmonary Disease

## 2017-02-24 ENCOUNTER — Ambulatory Visit (INDEPENDENT_AMBULATORY_CARE_PROVIDER_SITE_OTHER): Payer: PPO | Admitting: Pulmonary Disease

## 2017-02-24 DIAGNOSIS — G4733 Obstructive sleep apnea (adult) (pediatric): Secondary | ICD-10-CM

## 2017-02-24 DIAGNOSIS — I480 Paroxysmal atrial fibrillation: Secondary | ICD-10-CM

## 2017-02-24 NOTE — Progress Notes (Signed)
   Subjective:    Patient ID: Donald [name], male    DOB: January 31, 1933, 83 y.o.   MRN: [id number]  HPI  9 ex-smoker With atrial fibrillation status post PPM for FU of OSA. He used dental appliances without any benefit before getting on CPAP   Smoked a pack per day until he quit in 2000, about 40 pack years.   He denies daytime somnolence or fatigue. CPAP has really helped him, he has been using this for 3 years. He denies any problems with fullface mask or pressure. No nasal congestion or mouth dryness. CPAP download check October the last 2 years and has not shown significant leak or residual events  He has been getting his supply is on time from [facility]. His brother died of pulmonary fibrosis and he is concerned about that, we reviewed his CT abdomen that he had in 2017 and again in 2018, lung bases appeared clear  Significant tests/ events  Spirometry -  FEV1 ratio of 75, with FEV1 of 77% and FVC of 77% suggesting mild restriction CT chest in 04/2013 showed mild emphysema PSG 04/2014 - 206 lbs-Moderate OSA-stopped breathing 24 times an hour.- corrected by C Pap 13 cm, large full facemask   Review of Systems Patient denies significant dyspnea,cough, hemoptysis,  chest pain, palpitations, pedal edema, orthopnea, paroxysmal nocturnal dyspnea, lightheadedness, nausea, vomiting, abdominal or  leg pains      Objective:   Physical Exam   Gen. Pleasant, well-nourished, in no distress ENT - no thrush, no post nasal drip Neck: No JVD, no thyromegaly, no carotid bruits Lungs: no use of accessory muscles, no dullness to percussion, clear without rales or rhonchi  Cardiovascular: Rhythm regular, heart sounds  normal, no murmurs or gallops, no peripheral edema Musculoskeletal: No deformities, no cyanosis or clubbing         Assessment & Plan:

## 2017-02-24 NOTE — Assessment & Plan Note (Signed)
CPAP supplies will be renewed for a year  Weight loss encouraged, compliance with goal of at least 4-6 hrs every night is the expectation. Advised against medications with sedative side effects Cautioned against driving when sleepy - understanding that sleepiness will vary on a day to day basis  

## 2017-02-24 NOTE — Patient Instructions (Signed)
You do not have any evidence of pulmonary fibrosis.  CPAP supplies will be renewed for a year

## 2017-02-24 NOTE — Addendum Note (Signed)
Addended by: Valerie Salts on: 02/24/2017 09:57 AM   Modules accepted: Orders

## 2017-02-24 NOTE — Assessment & Plan Note (Signed)
Appears controlled.   You do not have any evidence of pulmonary fibrosis.

## 2017-03-03 DIAGNOSIS — G4733 Obstructive sleep apnea (adult) (pediatric): Secondary | ICD-10-CM | POA: Diagnosis not present

## 2017-03-10 ENCOUNTER — Encounter: Payer: Self-pay | Admitting: Internal Medicine

## 2017-03-10 ENCOUNTER — Ambulatory Visit (INDEPENDENT_AMBULATORY_CARE_PROVIDER_SITE_OTHER): Payer: PPO | Admitting: Internal Medicine

## 2017-03-10 ENCOUNTER — Encounter: Payer: Self-pay | Admitting: *Deleted

## 2017-03-10 DIAGNOSIS — Z950 Presence of cardiac pacemaker: Secondary | ICD-10-CM

## 2017-03-10 DIAGNOSIS — R079 Chest pain, unspecified: Secondary | ICD-10-CM | POA: Diagnosis not present

## 2017-03-10 DIAGNOSIS — I495 Sick sinus syndrome: Secondary | ICD-10-CM | POA: Diagnosis not present

## 2017-03-10 DIAGNOSIS — I480 Paroxysmal atrial fibrillation: Secondary | ICD-10-CM

## 2017-03-10 DIAGNOSIS — Z01812 Encounter for preprocedural laboratory examination: Secondary | ICD-10-CM | POA: Diagnosis not present

## 2017-03-10 LAB — CBC WITH DIFFERENTIAL/PLATELET
BASOS ABS: 0 10*3/uL (ref 0.0–0.2)
Basos: 0 %
EOS (ABSOLUTE): 0.2 10*3/uL (ref 0.0–0.4)
EOS: 3 %
HEMATOCRIT: 36.9 % — AB (ref 37.5–51.0)
HEMOGLOBIN: 12.7 g/dL — AB (ref 13.0–17.7)
IMMATURE GRANS (ABS): 0 10*3/uL (ref 0.0–0.1)
IMMATURE GRANULOCYTES: 0 %
LYMPHS: 25 %
Lymphocytes Absolute: 1.7 10*3/uL (ref 0.7–3.1)
MCH: 33.2 pg — ABNORMAL HIGH (ref 26.6–33.0)
MCHC: 34.4 g/dL (ref 31.5–35.7)
MCV: 97 fL (ref 79–97)
MONOCYTES: 9 %
Monocytes Absolute: 0.6 10*3/uL (ref 0.1–0.9)
NEUTROS PCT: 63 %
Neutrophils Absolute: 4.3 10*3/uL (ref 1.4–7.0)
Platelets: 169 10*3/uL (ref 150–379)
RBC: 3.82 x10E6/uL — AB (ref 4.14–5.80)
RDW: 13.7 % (ref 12.3–15.4)
WBC: 6.9 10*3/uL (ref 3.4–10.8)

## 2017-03-10 LAB — BASIC METABOLIC PANEL
BUN / CREAT RATIO: 21 (ref 10–24)
BUN: 22 mg/dL (ref 8–27)
CALCIUM: 9.3 mg/dL (ref 8.6–10.2)
CO2: 26 mmol/L (ref 20–29)
CREATININE: 1.03 mg/dL (ref 0.76–1.27)
Chloride: 101 mmol/L (ref 96–106)
GFR calc Af Amer: 78 mL/min/{1.73_m2} (ref >59)
GFR calc non Af Amer: 67 mL/min/{1.73_m2} (ref >59)
Glucose: 95 mg/dL (ref 65–99)
Potassium: 4.9 mmol/L (ref 3.5–5.2)
Sodium: 138 mmol/L (ref 134–144)

## 2017-03-10 LAB — CUP PACEART INCLINIC DEVICE CHECK
Date Time Interrogation Session: 20181026140543
Implantable Lead Implant Date: 20171002
Implantable Lead Location: 753859
Implantable Lead Location: 753860
MDC IDC LEAD IMPLANT DT: 20171002
MDC IDC PG IMPLANT DT: 20171002
Pulse Gen Model: 2272
Pulse Gen Serial Number: 7951215

## 2017-03-10 NOTE — Patient Instructions (Signed)
Medication Instructions:  Your physician has recommended you make the following change in your medication:  1. Hold your Eliquis on 10/30 & 10/31 for catheterization  Labwork: Pre procedure labs today: BMET & CBC w/ diff  Testing/Procedures: Your physician has requested that you have a cardiac catheterization. Cardiac catheterization is used to diagnose and/or treat various heart conditions. Doctors may recommend this procedure for a number of different reasons. The most common reason is to evaluate chest pain. Chest pain can be a symptom of coronary artery disease (CAD), and cardiac catheterization can show whether plaque is narrowing or blocking your heart's arteries. This procedure is also used to evaluate the valves, as well as measure the blood flow and oxygen levels in different parts of your heart. For further information please visit [email or web address]. Please follow instruction sheet, as given.  Follow-Up: Your physician recommends that you schedule a follow-up appointment in: 2 months with Donald Marshall, NP.   -- If you need a refill on your cardiac medications before your next appointment, please call your pharmacy. --  Thank you for choosing [facility]!!     Any Other Special Instructions Will Be Listed Below (If Applicable).  Coronary Angiogram A coronary angiogram is an X-ray procedure that is used to examine the arteries in the heart. In this procedure, a dye (contrast dye) is injected through a long, thin tube (catheter). The catheter is inserted through the groin, wrist, or arm. The dye is injected into each artery, then X-rays are taken to show if there is a blockage in the arteries of the heart. This procedure can also show if you have valve disease or a disease of the aorta, and it can be used to check the overall function of your heart muscle. You may have a coronary angiogram if:  You are having chest pain, or other symptoms of angina, and you are at risk for heart  disease.  You have an abnormal electrocardiogram (ECG) or stress test.  You have chest pain and heart failure.  You are having irregular heart rhythms.  You and your health care provider determine that the benefits of the test information outweigh the risks of the procedure.  Let your health care provider know about:  Any allergies you have, including allergies to contrast dye.  All medicines you are taking, including vitamins, herbs, eye drops, creams, and over-the-counter medicines.  Any problems you or family members have had with anesthetic medicines.  Any blood disorders you have.  Any surgeries you have had.  History of kidney problems or kidney failure.  Any medical conditions you have.  Whether you are pregnant or may be pregnant. What are the risks? Generally, this is a safe procedure. However, problems may occur, including:  Infection.  Allergic reaction to medicines or dyes that are used.  Bleeding from the access site or other locations.  Kidney injury, especially in people with impaired kidney function.  Stroke (rare).  Heart attack (rare).  Damage to other structures or organs.  What happens before the procedure? Staying hydrated Follow instructions from your health care provider about hydration, which may include:  Up to 2 hours before the procedure - you may continue to drink clear liquids, such as water, clear fruit juice, black coffee, and plain tea.  Eating and drinking restrictions Follow instructions from your health care provider about eating and drinking, which may include:  8 hours before the procedure - stop eating heavy meals or foods such as meat, fried foods, or fatty  foods.  6 hours before the procedure - stop eating light meals or foods, such as toast or cereal.  2 hours before the procedure - stop drinking clear liquids.  General instructions  Ask your health care provider about: ? Changing or stopping your regular  medicines. This is especially important if you are taking diabetes medicines or blood thinners. ? Taking medicines such as ibuprofen. These medicines can thin your blood. Do not take these medicines before your procedure if your health care provider instructs you not to, though aspirin may be recommended prior to coronary angiograms.  Plan to have someone take you home from the hospital or clinic.  You may need to have blood tests or X-rays done. What happens during the procedure?  An IV tube will be inserted into one of your veins.  You will be given one or more of the following: ? A medicine to help you relax (sedative). ? A medicine to numb the area where the catheter will be inserted into an artery (local anesthetic).  To reduce your risk of infection: ? Your health care team will wash or sanitize their hands. ? Your skin will be washed with soap. ? Hair may be removed from the area where the catheter will be inserted.  You will be connected to a continuous ECG monitor.  The catheter will be inserted into an artery. The location may be in your groin, in your wrist, or in the fold of your arm (near your elbow).  A type of X-ray (fluoroscopy) will be used to help guide the catheter to the opening of the blood vessel that is being examined.  A dye will be injected into the catheter, and X-rays will be taken. The dye will help to show where any narrowing or blockages are located in the heart arteries.  Tell your health care provider if you have any chest pain or trouble breathing during the procedure.  If blockages are found, your health care provider may perform another procedure, such as inserting a coronary stent. The procedure may vary among health care providers and hospitals. What happens after the procedure?  After the procedure, you will need to keep the area still for a few hours, or for as long as told by your health care provider. If the procedure is done through the  groin, you will be instructed to not bend and not cross your legs.  The insertion site will be checked frequently.  The pulse in your foot or wrist will be checked frequently.  You may have additional blood tests, X-rays, and a test that records the electrical activity of your heart (ECG).  Do not drive for 24 hours if you were given a sedative. Summary  A coronary angiogram is an X-ray procedure that is used to look into the arteries in the heart.  During the procedure, a dye (contrast dye) is injected through a long, thin tube (catheter). The catheter is inserted through the groin, wrist, or arm.  Tell your health care provider about any allergies you have, including allergies to contrast dye.  After the procedure, you will need to keep the area still for a few hours, or for as long as told by your health care provider. This information is not intended to replace advice given to you by your health care provider. Make sure you discuss any questions you have with your health care provider. Document Released: 11/06/2002 Document Revised: 02/12/2016 Document Reviewed: 02/12/2016 Elsevier Interactive Patient Education  [employer].

## 2017-03-10 NOTE — Progress Notes (Signed)
Patient Care Team: Janith Lima, MD as PCP - General (Internal Medicine) Franchot Gallo, MD (Urology) [name], [name], MD (Dermatology) Monna Fam, MD as Consulting Physician (Ophthalmology) Deboraha Sprang, MD as Consulting Physician (Cardiology)   HPI  Donald [name] is a 83 y.o. 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 (St Jude ).   This has been a recurring issue. Myoview was mildly abnormal catheterization recommended but preprocedure labs have demonstrated a hemoglobin of 7 and it was deferred.    October 2015 Echo - Left ventricle: The cavity size was normal. Wall thickness was increased in a pattern of mild LVH. Systolic function was normal. The estimated ejection fraction was in the range of 55% to 60%.<<30% (10/15)  Aortic valve: Heavily calcirfied right coronary cusp. There was mild stenosis. There was mild regurgitation.BICUSPID mean gradient was 10.  - Mitral valve: There was mild regurgitation. - Left atrium: The atrium was mildly to moderately dilated. - Right atrium: The atrium was mildly dilated. - Atrial septum: No defect or patent foramen ovale was identified.   DATE TEST    10/15    Echo   EF 55 %  mild AS ( mean gradient 10) --? Bicuspid aortic valve  9/16 myoview EF 60-65%   mild ischemia  10/17 Echo EF 60-65%  No comment on bicuspid valve//mild - Mod  AI       Date CR K hgb TSH  10/17 0.97  4.6 13.2   0.56  4/18 1.07 5.2 13.9    Still wi complaints of SOB with exertion particularly on hill, no edema and recurring throat pain   Relieved by rest and assoc with Dyspnea but not diaphoresis.  Non radiating   Past Medical History:  Diagnosis Date  . Abnormal CT scan, stomach 02/2014   Thickening of gastric fundus and cardia.  gastritis on EGD 03/2014  . Allergic rhinitis due to pollen   . Arthropathy, unspecified, site unspecified   . Atherosclerosis    a.  Noted by abdominal CT 02/2014 (h/o normal nuc 2011).  . Atrial flutter ([place]) 02/24/14   s/p ablation 11/15  . Cardiomyopathy ([place])    tachy mediated - a. TEE (10/15):  EF 30%;  b. Echo after NSR restored (10/15):  mild LVH, EF 55-60%, mild AS, mild AI, mild MR, mild to mod LAE, mild RAE  . Diverticulosis    severe in descending, sigmoid colon.   . ED (erectile dysfunction)   . Esophageal stricture 03/2014   traversable with endoscope. not dilated.   . Gastric AVM   . GERD (gastroesophageal reflux disease) 03/2014   small HH and gastritis on EGD  . GI bleed   . Habitual alcohol use   . Hemorrhoids   . Hiatal hernia   . Hyperlipidemia   . Hypertension   . Obesity   . OSA on CPAP   . PAF (paroxysmal atrial fibrillation) ([place])   . Prostatitis, unspecified   . Sinus bradycardia   . Skin cancer    basal and squamous cell    Past Surgical History:  Procedure Laterality Date  . ABLATION  03/19/2014   RFCA of atrial flutter by Dr Caryl Comes  . ATRIAL FLUTTER ABLATION N/A 03/19/2014   Procedure: ATRIAL FLUTTER ABLATION;  Surgeon: Deboraha Sprang, MD;  Location: [facility] CATH LAB;  Service: Cardiovascular;  Laterality: N/A;  . CARDIOVERSION N/A 02/24/2014   Procedure: CARDIOVERSION;  Surgeon: Aaron Edelman  Jacalyn Lefevre, MD;  Location: [place];  Service: Cardiovascular;  Laterality: N/A;  . CARDIOVERSION Right 09/10/2014   Procedure: CARDIOVERSION;  Surgeon: Deboraha Sprang, MD;  Location: [facility] CATH LAB;  Service: Cardiovascular;  Laterality: Right;  . COLONOSCOPY N/A 04/23/2015   Procedure: COLONOSCOPY;  Surgeon: Ladene Artist, MD;  Location: [facility] ENDOSCOPY;  Service: Endoscopy;  Laterality: N/A;  . ENTEROSCOPY N/A 06/12/2015   Procedure: ENTEROSCOPY;  Surgeon: Ladene Artist, MD;  Location: [facility] ENDOSCOPY;  Service: Endoscopy;  Laterality: N/A;  . EP IMPLANTABLE DEVICE N/A 02/15/2016   Procedure: Pacemaker Implant;  Surgeon: Evans Lance, MD;  Location: [place] CV LAB;  Service: Cardiovascular;   Laterality: N/A;  . ESOPHAGOGASTRODUODENOSCOPY N/A 04/09/2014   Procedure: ESOPHAGOGASTRODUODENOSCOPY (EGD);  Surgeon: Inda Castle, MD;  Location: [place];  Service: Endoscopy;  Laterality: N/A;  . ESOPHAGOGASTRODUODENOSCOPY N/A 04/23/2015   Procedure: ESOPHAGOGASTRODUODENOSCOPY (EGD);  Surgeon: Ladene Artist, MD;  Location: [facility] ENDOSCOPY;  Service: Endoscopy;  Laterality: N/A;  . HERNIA REPAIR    . INGUINAL HERNIA REPAIR     right  . INGUINAL HERNIA REPAIR     left  . ORIF fracture of the elbow    . SKIN CANCER EXCISION  05/21/12   Squamous cell ca  . SQUAMOUS CELL CARCINOMA EXCISION    . TEE WITHOUT CARDIOVERSION N/A 02/24/2014   Procedure: TRANSESOPHAGEAL ECHOCARDIOGRAM (TEE);  Surgeon: Lelon Perla, MD;  Location: [facility] ENDOSCOPY;  Service: Cardiovascular;  Laterality: N/A;  . TONSILLECTOMY      Current Outpatient Prescriptions  Medication Sig Dispense Refill  . atorvastatin (LIPITOR) 40 MG tablet TAKE 1 TABLET ONCE DAILY. 90 tablet 3  . ELIQUIS 5 MG TABS tablet TAKE 1 TABLET TWICE DAILY. 180 tablet 1  . ferrous sulfate 325 (65 FE) MG tablet Take 1 tablet (325 mg total) by mouth daily with breakfast. 60 tablet 3  . fluticasone (FLONASE) 50 MCG/ACT nasal spray 1 spray in each nostril daily as needed for stuffiness 16 g 11  . furosemide (LASIX) 40 MG tablet TAKE 1 TABLET EACH DAY. 90 tablet 1  . isosorbide mononitrate (IMDUR) 60 MG 24 hr tablet Take 1 tablet (60 mg total) by mouth daily. 90 tablet 3  . loratadine (CLARITIN) 10 MG tablet Take 10 mg by mouth daily as needed for allergies.     Marland Kitchen MAGNESIUM PO Take 4 tablets by mouth 2 (two) times daily.     . metoprolol (LOPRESSOR) 50 MG tablet TAKE 1 TABLET TWICE DAILY. 180 tablet 0  . nitroGLYCERIN (NITROSTAT) 0.4 MG SL tablet Place 1 tablet (0.4 mg total) under the tongue every 5 (five) minutes as needed for chest pain. 25 tablet 3  . NON FORMULARY c pap    . pantoprazole (PROTONIX) 40 MG tablet TAKE 1 TABLET EACH DAY. 90  tablet 1  . Wheat Dextrin (BENEFIBER) POWD Take 1 teaspoon daily.  If tolerated, increase to 1 tablespoon daily  0  . losartan (COZAAR) 50 MG tablet Take 1 tablet (50 mg total) by mouth daily. 90 tablet 3   No current facility-administered medications for this visit.     No Known Allergies  Review of Systems negative except from HPI and PMH  Physical Exam BP 136/68   Pulse 80   Ht 5\' 9"  (1.753 m)   Wt 224 lb (101.6 kg)   SpO2 99%   BMI 33.08 kg/m  Well developed and nourished in no acute distress HENT normal Neck supple with JVP-flat Clear  Regular rate and rhythm, 2/6 murmur Device pocket well healed; without hematoma or erythema.  There is no tethering  Abd-soft with active BS No Clubbing cyanosis edema Skin-warm and dry A & Oriented  Grossly normal sensory and motor function    ECG demonstrates atrial  pacing at 60  18/10/39 Otherwise normal   Assessment and  Plan  Atrial fibrillation/flutter-paroxysmal  CHADS-VASc score 5 (age-58 hypertension-1 cardiomyopathy-1 vascular disease-1)  Aortic Valve Bicuspid previously described but no longer evident  Cardiomyopathy-resolved  Pacemaker-St. Jude  Hypertension  Exertional chest discomfort  Abnormal Myoview scan evidence of ischemia   HFpEF/dyspnea on exertion  With ongoing issue we have discussed the role of catheterization which had been referred previously because of anemia. We will check blood work and then proceed with repeat catheterization  In the interim, we have reprogrammed his device to try to stay below a ischemic threshold; we walked him on the stairs and there are some functional improvement with our initial change. There was recurrent chest pain and so we further decreased slow 10--9--8 and maximum 120--110  Euvolemic continue current meds  BP will controlled

## 2017-03-13 LAB — CUP PACEART REMOTE DEVICE CHECK
Battery Remaining Longevity: 117 mo
Battery Remaining Percentage: 95.5 %
Battery Voltage: 2.99 V
Brady Statistic AS VS Percent: 1 %
Brady Statistic RV Percent Paced: 1 %
Date Time Interrogation Session: 20181008060017
Implantable Lead Implant Date: 20171002
Implantable Pulse Generator Implant Date: 20171002
Lead Channel Pacing Threshold Amplitude: 0.5 V
Lead Channel Pacing Threshold Pulse Width: 0.5 ms
Lead Channel Sensing Intrinsic Amplitude: 3.6 mV
Lead Channel Setting Pacing Amplitude: 1 V
Lead Channel Setting Sensing Sensitivity: 2 mV
MDC IDC LEAD IMPLANT DT: 20171002
MDC IDC LEAD LOCATION: 753859
MDC IDC LEAD LOCATION: 753860
MDC IDC MSMT LEADCHNL RA IMPEDANCE VALUE: 430 Ohm
MDC IDC MSMT LEADCHNL RA PACING THRESHOLD PULSEWIDTH: 0.5 ms
MDC IDC MSMT LEADCHNL RV IMPEDANCE VALUE: 530 Ohm
MDC IDC MSMT LEADCHNL RV PACING THRESHOLD AMPLITUDE: 0.75 V
MDC IDC MSMT LEADCHNL RV SENSING INTR AMPL: 12 mV
MDC IDC PG SERIAL: 7951215
MDC IDC SET LEADCHNL RA PACING AMPLITUDE: 2 V
MDC IDC SET LEADCHNL RV PACING PULSEWIDTH: 0.5 ms
MDC IDC STAT BRADY AP VP PERCENT: 1 %
MDC IDC STAT BRADY AP VS PERCENT: 99 %
MDC IDC STAT BRADY AS VP PERCENT: 1 %
MDC IDC STAT BRADY RA PERCENT PACED: 99 %

## 2017-03-14 ENCOUNTER — Telehealth: Payer: Self-pay

## 2017-03-14 NOTE — Telephone Encounter (Signed)
Patient contacted pre-catheterization at [facility] scheduled for:  03/16/2017 @ 1330 Verified arrival time and place:  NT @ 1130 Confirmed AM meds to be taken pre-cath with sip of water: Take ASA Hold lasix Hold Eliquis-last dose 03/13/2017 Confirmed patient has responsible person to drive home post procedure and observe patient for 24 hours:  yes Addl concerns:  Pt concerned about stent, taking antiplatelet with history of gi bleed.  Encouraged Pt to inform GI doctor if he does need a stent.  Pt indicates understanding.

## 2017-03-16 ENCOUNTER — Ambulatory Visit (HOSPITAL_COMMUNITY)
Admission: RE | Admit: 2017-03-16 | Discharge: 2017-03-16 | Disposition: A | Discharge status: Home / Self Care | Payer: PPO | Source: Ambulatory Visit | Attending: Cardiovascular Disease | Admitting: Cardiovascular Disease

## 2017-03-16 ENCOUNTER — Encounter (HOSPITAL_COMMUNITY)
Admission: RE | Disposition: A | Discharge status: Home / Self Care | Payer: Self-pay | Source: Ambulatory Visit | Attending: Cardiovascular Disease

## 2017-03-16 DIAGNOSIS — Z7951 Long term (current) use of inhaled steroids: Secondary | ICD-10-CM | POA: Diagnosis not present

## 2017-03-16 DIAGNOSIS — Z950 Presence of cardiac pacemaker: Secondary | ICD-10-CM | POA: Diagnosis not present

## 2017-03-16 DIAGNOSIS — K219 Gastro-esophageal reflux disease without esophagitis: Secondary | ICD-10-CM | POA: Insufficient documentation

## 2017-03-16 DIAGNOSIS — E785 Hyperlipidemia, unspecified: Secondary | ICD-10-CM | POA: Diagnosis not present

## 2017-03-16 DIAGNOSIS — I481 Persistent atrial fibrillation: Secondary | ICD-10-CM | POA: Insufficient documentation

## 2017-03-16 DIAGNOSIS — G4733 Obstructive sleep apnea (adult) (pediatric): Secondary | ICD-10-CM | POA: Insufficient documentation

## 2017-03-16 DIAGNOSIS — Z6833 Body mass index (BMI) 33.0-33.9, adult: Secondary | ICD-10-CM | POA: Diagnosis not present

## 2017-03-16 DIAGNOSIS — R0789 Other chest pain: Secondary | ICD-10-CM | POA: Insufficient documentation

## 2017-03-16 DIAGNOSIS — I2510 Atherosclerotic heart disease of native coronary artery without angina pectoris: Secondary | ICD-10-CM | POA: Diagnosis not present

## 2017-03-16 DIAGNOSIS — Z7901 Long term (current) use of anticoagulants: Secondary | ICD-10-CM | POA: Insufficient documentation

## 2017-03-16 DIAGNOSIS — E669 Obesity, unspecified: Secondary | ICD-10-CM | POA: Diagnosis not present

## 2017-03-16 DIAGNOSIS — I10 Essential (primary) hypertension: Secondary | ICD-10-CM | POA: Insufficient documentation

## 2017-03-16 DIAGNOSIS — R079 Chest pain, unspecified: Secondary | ICD-10-CM

## 2017-03-16 HISTORY — PX: LEFT HEART CATH AND CORONARY ANGIOGRAPHY: CATH118249

## 2017-03-16 SURGERY — LEFT HEART CATH AND CORONARY ANGIOGRAPHY
Anesthesia: LOCAL

## 2017-03-16 MED ORDER — SODIUM CHLORIDE 0.9% FLUSH: 3.0000 mL | INTRAVENOUS | Status: DC | PRN

## 2017-03-16 MED ORDER — SODIUM CHLORIDE 0.9 % WEIGHT BASED INFUSION: 1.0000 mL/kg/h | INTRAVENOUS | Status: DC

## 2017-03-16 MED ORDER — HEPARIN (PORCINE) IN NACL 2-0.9 UNIT/ML-% IJ SOLN
INTRAMUSCULAR | Status: AC | PRN
  Administered 2017-03-16: 1000 mL via INTRA_ARTERIAL

## 2017-03-16 MED ORDER — SODIUM CHLORIDE 0.9% FLUSH
3.0000 mL | Freq: Two times a day (BID) | INTRAVENOUS | Status: DC
Start: 2017-03-16 — End: 2017-03-16

## 2017-03-16 MED ORDER — HEPARIN (PORCINE) IN NACL 2-0.9 UNIT/ML-% IJ SOLN
INTRAMUSCULAR | Status: AC
  Filled 2017-03-16: qty 1000

## 2017-03-16 MED ORDER — MIDAZOLAM HCL 2 MG/2ML IJ SOLN
INTRAMUSCULAR | Status: AC
  Filled 2017-03-16: qty 2

## 2017-03-16 MED ORDER — SODIUM CHLORIDE 0.9 % IV SOLN: INTRAVENOUS | Status: DC

## 2017-03-16 MED ORDER — SODIUM CHLORIDE 0.9 % IV SOLN: 250.0000 mL | INTRAVENOUS | Status: DC | PRN

## 2017-03-16 MED ORDER — HEPARIN SODIUM (PORCINE) 1000 UNIT/ML IJ SOLN
INTRAMUSCULAR | Status: DC | PRN
  Administered 2017-03-16: 4500 [IU] via INTRAVENOUS

## 2017-03-16 MED ORDER — HEPARIN SODIUM (PORCINE) 1000 UNIT/ML IJ SOLN
INTRAMUSCULAR | Status: AC
  Filled 2017-03-16: qty 1

## 2017-03-16 MED ORDER — FENTANYL CITRATE (PF) 100 MCG/2ML IJ SOLN
INTRAMUSCULAR | Status: DC | PRN
  Administered 2017-03-16: 25 ug via INTRAVENOUS

## 2017-03-16 MED ORDER — MIDAZOLAM HCL 2 MG/2ML IJ SOLN
INTRAMUSCULAR | Status: DC | PRN
  Administered 2017-03-16: 1 mg via INTRAVENOUS

## 2017-03-16 MED ORDER — LIDOCAINE HCL (PF) 1 % IJ SOLN
INTRAMUSCULAR | Status: AC
  Filled 2017-03-16: qty 30

## 2017-03-16 MED ORDER — FENTANYL CITRATE (PF) 100 MCG/2ML IJ SOLN
INTRAMUSCULAR | Status: AC
  Filled 2017-03-16: qty 2

## 2017-03-16 MED ORDER — ASPIRIN 81 MG PO CHEW: 81.0000 mg | CHEWABLE_TABLET | ORAL | Status: DC

## 2017-03-16 MED ORDER — VERAPAMIL HCL 2.5 MG/ML IV SOLN
INTRAVENOUS | Status: AC
  Filled 2017-03-16: qty 2

## 2017-03-16 MED ORDER — SODIUM CHLORIDE 0.9% FLUSH: 3.0000 mL | Freq: Two times a day (BID) | INTRAVENOUS | Status: DC

## 2017-03-16 MED ORDER — SODIUM CHLORIDE 0.9 % WEIGHT BASED INFUSION
3.0000 mL/kg/h | INTRAVENOUS | Status: AC
  Administered 2017-03-16: 3 mL/kg/h via INTRAVENOUS

## 2017-03-16 MED ORDER — LIDOCAINE HCL (PF) 1 % IJ SOLN
INTRAMUSCULAR | Status: DC | PRN
  Administered 2017-03-16: 2 mL

## 2017-03-16 MED ORDER — IOPAMIDOL (ISOVUE-370) INJECTION 76%
INTRAVENOUS | Status: AC
  Filled 2017-03-16: qty 100

## 2017-03-16 MED ORDER — VERAPAMIL HCL 2.5 MG/ML IV SOLN
INTRAVENOUS | Status: DC | PRN
  Administered 2017-03-16: 10 mL via INTRA_ARTERIAL

## 2017-03-16 SURGICAL SUPPLY — 12 items
CATH INFINITI 5 FR JL3.5 (CATHETERS) ×1 IMPLANT
CATH INFINITI 5FR ANG PIGTAIL (CATHETERS) ×1 IMPLANT
CATH INFINITI JR4 5F (CATHETERS) ×1 IMPLANT
DEVICE RAD COMP TR BAND LRG (VASCULAR PRODUCTS) ×1 IMPLANT
GLIDESHEATH SLEND SS 6F .021 (SHEATH) ×1 IMPLANT
GUIDEWIRE INQWIRE 1.5J.035X260 (WIRE) IMPLANT
INQWIRE 1.5J .035X260CM (WIRE) ×2
KIT HEART LEFT (KITS) ×2 IMPLANT
PACK CARDIAC CATHETERIZATION (CUSTOM PROCEDURE TRAY) ×2 IMPLANT
SYR MEDRAD MARK V 150ML (SYRINGE) ×2 IMPLANT
TRANSDUCER W/STOPCOCK (MISCELLANEOUS) ×2 IMPLANT
TUBING CIL FLEX 10 FLL-RA (TUBING) ×2 IMPLANT

## 2017-03-16 NOTE — H&P (View-Only) (Signed)
Patient Care Team: Janith Lima, MD as PCP - General (Internal Medicine) Franchot Gallo, MD (Urology) [name], [name], MD (Dermatology) Monna Fam, MD as Consulting Physician (Ophthalmology) Deboraha Sprang, MD as Consulting Physician (Cardiology)   HPI  Donald [name] is a 83 y.o. 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 (St Jude ).   This has been a recurring issue. Myoview was mildly abnormal catheterization recommended but preprocedure labs have demonstrated a hemoglobin of 7 and it was deferred.    October 2015 Echo - Left ventricle: The cavity size was normal. Wall thickness was increased in a pattern of mild LVH. Systolic function was normal. The estimated ejection fraction was in the range of 55% to 60%.<<30% (10/15)  Aortic valve: Heavily calcirfied right coronary cusp. There was mild stenosis. There was mild regurgitation.BICUSPID mean gradient was 10.  - Mitral valve: There was mild regurgitation. - Left atrium: The atrium was mildly to moderately dilated. - Right atrium: The atrium was mildly dilated. - Atrial septum: No defect or patent foramen ovale was identified.   DATE TEST    10/15    Echo   EF 55 %  mild AS ( mean gradient 10) --? Bicuspid aortic valve  9/16 myoview EF 60-65%   mild ischemia  10/17 Echo EF 60-65%  No comment on bicuspid valve//mild - Mod  AI       Date CR K hgb TSH  10/17 0.97  4.6 13.2   0.56  4/18 1.07 5.2 13.9    Still wi complaints of SOB with exertion particularly on hill, no edema and recurring throat pain   Relieved by rest and assoc with Dyspnea but not diaphoresis.  Non radiating   Past Medical History:  Diagnosis Date  . Abnormal CT scan, stomach 02/2014   Thickening of gastric fundus and cardia.  gastritis on EGD 03/2014  . Allergic rhinitis due to pollen   . Arthropathy, unspecified, site unspecified   . Atherosclerosis    a.  Noted by abdominal CT 02/2014 (h/o normal nuc 2011).  . Atrial flutter ([place]) 02/24/14   s/p ablation 11/15  . Cardiomyopathy ([place])    tachy mediated - a. TEE (10/15):  EF 30%;  b. Echo after NSR restored (10/15):  mild LVH, EF 55-60%, mild AS, mild AI, mild MR, mild to mod LAE, mild RAE  . Diverticulosis    severe in descending, sigmoid colon.   . ED (erectile dysfunction)   . Esophageal stricture 03/2014   traversable with endoscope. not dilated.   . Gastric AVM   . GERD (gastroesophageal reflux disease) 03/2014   small HH and gastritis on EGD  . GI bleed   . Habitual alcohol use   . Hemorrhoids   . Hiatal hernia   . Hyperlipidemia   . Hypertension   . Obesity   . OSA on CPAP   . PAF (paroxysmal atrial fibrillation) ([place])   . Prostatitis, unspecified   . Sinus bradycardia   . Skin cancer    basal and squamous cell    Past Surgical History:  Procedure Laterality Date  . ABLATION  03/19/2014   RFCA of atrial flutter by Dr Caryl Comes  . ATRIAL FLUTTER ABLATION N/A 03/19/2014   Procedure: ATRIAL FLUTTER ABLATION;  Surgeon: Deboraha Sprang, MD;  Location: [facility] CATH LAB;  Service: Cardiovascular;  Laterality: N/A;  . CARDIOVERSION N/A 02/24/2014   Procedure: CARDIOVERSION;  Surgeon: Aaron Edelman  Jacalyn Lefevre, MD;  Location: [place];  Service: Cardiovascular;  Laterality: N/A;  . CARDIOVERSION Right 09/10/2014   Procedure: CARDIOVERSION;  Surgeon: Deboraha [place]ang, MD;  Location: [facility] CATH LAB;  Service: Cardiovascular;  Laterality: Right;  . COLONOSCOPY N/A 04/23/2015   Procedure: COLONOSCOPY;  Surgeon: Ladene Artist, MD;  Location: [facility] ENDOSCOPY;  Service: Endoscopy;  Laterality: N/A;  . ENTEROSCOPY N/A 06/12/2015   Procedure: ENTEROSCOPY;  Surgeon: Ladene Artist, MD;  Location: [facility] ENDOSCOPY;  Service: Endoscopy;  Laterality: N/A;  . EP IMPLANTABLE DEVICE N/A 02/15/2016   Procedure: Pacemaker Implant;  Surgeon: Evans Lance, MD;  Location: [place] CV LAB;  Service: Cardiovascular;   Laterality: N/A;  . ESOPHAGOGASTRODUODENOSCOPY N/A 04/09/2014   Procedure: ESOPHAGOGASTRODUODENOSCOPY (EGD);  Surgeon: Inda Castle, MD;  Location: [place];  Service: Endoscopy;  Laterality: N/A;  . ESOPHAGOGASTRODUODENOSCOPY N/A 04/23/2015   Procedure: ESOPHAGOGASTRODUODENOSCOPY (EGD);  Surgeon: Ladene Artist, MD;  Location: [facility] ENDOSCOPY;  Service: Endoscopy;  Laterality: N/A;  . HERNIA REPAIR    . INGUINAL HERNIA REPAIR     right  . INGUINAL HERNIA REPAIR     left  . ORIF fracture of the elbow    . SKIN CANCER EXCISION  05/21/12   Squamous cell ca  . SQUAMOUS CELL CARCINOMA EXCISION    . TEE WITHOUT CARDIOVERSION N/A 02/24/2014   Procedure: TRANSESOPHAGEAL ECHOCARDIOGRAM (TEE);  Surgeon: Lelon Perla, MD;  Location: [facility] ENDOSCOPY;  Service: Cardiovascular;  Laterality: N/A;  . TONSILLECTOMY      Current Outpatient Prescriptions  Medication Sig Dispense Refill  . atorvastatin (LIPITOR) 40 MG tablet TAKE 1 TABLET ONCE DAILY. 90 tablet 3  . ELIQUIS 5 MG TABS tablet TAKE 1 TABLET TWICE DAILY. 180 tablet 1  . ferrous sulfate 325 (65 FE) MG tablet Take 1 tablet (325 mg total) by mouth daily with breakfast. 60 tablet 3  . fluticasone (FLONASE) 50 MCG/ACT nasal spray 1 spray in each nostril daily as needed for stuffiness 16 g 11  . furosemide (LASIX) 40 MG tablet TAKE 1 TABLET EACH DAY. 90 tablet 1  . isosorbide mononitrate (IMDUR) 60 MG 24 hr tablet Take 1 tablet (60 mg total) by mouth daily. 90 tablet 3  . loratadine (CLARITIN) 10 MG tablet Take 10 mg by mouth daily as needed for allergies.     Marland Kitchen MAGNESIUM PO Take 4 tablets by mouth 2 (two) times daily.     . metoprolol (LOPRESSOR) 50 MG tablet TAKE 1 TABLET TWICE DAILY. 180 tablet 0  . nitroGLYCERIN (NITROSTAT) 0.4 MG SL tablet Place 1 tablet (0.4 mg total) under the tongue every 5 (five) minutes as needed for chest pain. 25 tablet 3  . NON FORMULARY c pap    . pantoprazole (PROTONIX) 40 MG tablet TAKE 1 TABLET EACH DAY. 90  tablet 1  . Wheat Dextrin (BENEFIBER) POWD Take 1 teaspoon daily.  If tolerated, increase to 1 tablespoon daily  0  . losartan (COZAAR) 50 MG tablet Take 1 tablet (50 mg total) by mouth daily. 90 tablet 3   No current facility-administered medications for this visit.     No Known Allergies  Review of Systems negative except from HPI and PMH  Physical Exam BP 136/68   Pulse 80   Ht 5\' 9"  (1.753 m)   Wt 224 lb (101.6 kg)   SpO2 99%   BMI 33.08 kg/m  Well developed and nourished in no acute distress HENT normal Neck supple with JVP-flat Clear  Regular rate and rhythm, 2/6 murmur Device pocket well healed; without hematoma or erythema.  There is no tethering  Abd-soft with active BS No Clubbing cyanosis edema Skin-warm and dry A & Oriented  Grossly normal sensory and motor function    ECG demonstrates atrial  pacing at 60  18/10/39 Otherwise normal   Assessment and  Plan  Atrial fibrillation/flutter-paroxysmal  CHADS-VASc score 5 (age-17 hypertension-1 cardiomyopathy-1 vascular disease-1)  Aortic Valve Bicuspid previously described but no longer evident  Cardiomyopathy-resolved  Pacemaker-St. Jude  Hypertension  Exertional chest discomfort  Abnormal Myoview scan evidence of ischemia   HFpEF/dyspnea on exertion  With ongoing issue we have discussed the role of catheterization which had been referred previously because of anemia. We will check blood work and then proceed with repeat catheterization  In the interim, we have reprogrammed his device to try to stay below a ischemic threshold; we walked him on the stairs and there are some functional improvement with our initial change. There was recurrent chest pain and so we further decreased slow 10--9--8 and maximum 120--110  Euvolemic continue current meds  BP will controlled

## 2017-03-16 NOTE — Interval H&P Note (Signed)
History and Physical Interval Note:  03/16/2017 12:40 PM  Donald [name]  has presented today for cardiac cath with the diagnosis of chest pain/abnormal stress test. The various methods of treatment have been discussed with the patient and family. After consideration of risks, benefits and other options for treatment, the patient has consented to  Procedure(s): LEFT HEART CATH AND CORONARY ANGIOGRAPHY (N/A) as a surgical intervention .  The patient's history has been reviewed, patient examined, no change in status, stable for surgery.  I have reviewed the patient's chart and labs.  Questions were answered to the patient's satisfaction.    Cath Lab Visit (complete for each Cath Lab visit)  Clinical Evaluation Leading to the Procedure:   ACS: No.  Non-ACS:    Anginal Classification: CCS III  Anti-ischemic medical therapy: Maximal Therapy (2 or more classes of medications)  Non-Invasive Test Results: No non-invasive testing performed  Prior CABG: No previous CABG         Lauree Chandler

## 2017-03-16 NOTE — Discharge Instructions (Signed)

## 2017-03-17 ENCOUNTER — Other Ambulatory Visit: Payer: Self-pay | Admitting: Internal Medicine

## 2017-03-17 ENCOUNTER — Encounter (HOSPITAL_COMMUNITY): Payer: Self-pay | Admitting: Cardiovascular Disease

## 2017-03-26 ENCOUNTER — Other Ambulatory Visit: Payer: Self-pay | Admitting: Internal Medicine

## 2017-03-29 DIAGNOSIS — G4733 Obstructive sleep apnea (adult) (pediatric): Secondary | ICD-10-CM | POA: Diagnosis not present

## 2017-04-20 ENCOUNTER — Other Ambulatory Visit: Payer: Self-pay | Admitting: Internal Medicine

## 2017-04-20 DIAGNOSIS — I10 Essential (primary) hypertension: Secondary | ICD-10-CM

## 2017-04-26 DIAGNOSIS — L723 Sebaceous cyst: Secondary | ICD-10-CM | POA: Diagnosis not present

## 2017-04-26 DIAGNOSIS — L918 Other hypertrophic disorders of the skin: Secondary | ICD-10-CM | POA: Diagnosis not present

## 2017-04-26 DIAGNOSIS — Z85828 Personal history of other malignant neoplasm of skin: Secondary | ICD-10-CM | POA: Diagnosis not present

## 2017-04-26 DIAGNOSIS — L821 Other seborrheic keratosis: Secondary | ICD-10-CM | POA: Diagnosis not present

## 2017-04-26 DIAGNOSIS — L570 Actinic keratosis: Secondary | ICD-10-CM | POA: Diagnosis not present

## 2017-04-26 DIAGNOSIS — C44719 Basal cell carcinoma of skin of left lower limb, including hip: Secondary | ICD-10-CM | POA: Diagnosis not present

## 2017-05-04 DIAGNOSIS — G4733 Obstructive sleep apnea (adult) (pediatric): Secondary | ICD-10-CM | POA: Diagnosis not present

## 2017-05-04 NOTE — Progress Notes (Signed)
Electrophysiology Office Note Date: 05/05/2017  ID:  Donald [name], DOB 19-Jun-1932, MRN [id number]  PCP: Janith Lima, MD Electrophysiologist: Caryl Comes  CC: Pacemaker and cath follow-up  Donald [name] is a 83 y.o. male seen today for Dr Caryl Comes.  He presents today for routine electrophysiology followup.  Since last being seen in our clinic, the patient reports doing reasonably well.  He has stable exertional angina and shortness of breath.  This is unchanged from prior to cath.  He denies palpitations, PND, orthopnea, nausea, vomiting, dizziness, syncope, edema, weight gain, or early satiety.  Device History: STJ dual chamber PPM implanted 2017 for SSS   Past Medical History:  Diagnosis Date  . Abnormal CT scan, stomach 02/2014   Thickening of gastric fundus and cardia.  gastritis on EGD 03/2014  . Allergic rhinitis due to pollen   . Arthropathy, unspecified, site unspecified   . Atherosclerosis    a. Noted by abdominal CT 02/2014 (h/o normal nuc 2011).  . Atrial flutter ([place]) 02/24/14   s/p ablation 11/15  . Cardiomyopathy ([place])    tachy mediated - a. TEE (10/15):  EF 30%;  b. Echo after NSR restored (10/15):  mild LVH, EF 55-60%, mild AS, mild AI, mild MR, mild to mod LAE, mild RAE  . Diverticulosis    severe in descending, sigmoid colon.   . ED (erectile dysfunction)   . Esophageal stricture 03/2014   traversable with endoscope. not dilated.   . Gastric AVM   . GERD (gastroesophageal reflux disease) 03/2014   small HH and gastritis on EGD  . GI bleed   . Habitual alcohol use   . Hemorrhoids   . Hiatal hernia   . Hyperlipidemia   . Hypertension   . Obesity   . OSA on CPAP   . PAF (paroxysmal atrial fibrillation) ([place])   . Prostatitis, unspecified   . 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 Skin cancer    basal and squamous cell   Past Surgical History:  Procedure Laterality Date  . ATRIAL FLUTTER ABLATION N/A 03/19/2014   RFCA of atrial flutter  by Dr Caryl Comes  . CARDIOVERSION N/A 02/24/2014   Procedure: CARDIOVERSION;  Surgeon: Lelon Perla, MD;  Location: [place];  Service: Cardiovascular;  Laterality: N/A;  . CARDIOVERSION Right 09/10/2014   Procedure: CARDIOVERSION;  Surgeon: Deboraha Sprang, MD;  Location: [facility] CATH LAB;  Service: Cardiovascular;  Laterality: Right;  . COLONOSCOPY N/A 04/23/2015   Procedure: COLONOSCOPY;  Surgeon: Ladene Artist, MD;  Location: [facility] ENDOSCOPY;  Service: Endoscopy;  Laterality: N/A;  . ENTEROSCOPY N/A 06/12/2015   Procedure: ENTEROSCOPY;  Surgeon: Ladene Artist, MD;  Location: [facility] ENDOSCOPY;  Service: Endoscopy;  Laterality: N/A;  . EP IMPLANTABLE DEVICE N/A 02/15/2016   Procedure: Pacemaker Implant;  Surgeon: Evans Lance, MD;  Location: [place] CV LAB;  Service: Cardiovascular;  Laterality: N/A;  . ESOPHAGOGASTRODUODENOSCOPY N/A 04/09/2014   Procedure: ESOPHAGOGASTRODUODENOSCOPY (EGD);  Surgeon: Inda Castle, MD;  Location: [place];  Service: Endoscopy;  Laterality: N/A;  . ESOPHAGOGASTRODUODENOSCOPY N/A 04/23/2015   Procedure: ESOPHAGOGASTRODUODENOSCOPY (EGD);  Surgeon: Ladene Artist, MD;  Location: [facility] ENDOSCOPY;  Service: Endoscopy;  Laterality: N/A;  . INGUINAL HERNIA REPAIR     right  . INGUINAL HERNIA REPAIR     left  . LEFT HEART CATH AND CORONARY ANGIOGRAPHY N/A 03/16/2017   Procedure: LEFT HEART CATH AND CORONARY ANGIOGRAPHY;  Surgeon: Lauree Chandler  D, MD;  Location: [place] CV LAB;  Service: Cardiovascular;  Laterality: N/A;  . ORIF fracture of the elbow    . SKIN CANCER EXCISION  05/21/12   Squamous cell ca  . TEE WITHOUT CARDIOVERSION N/A 02/24/2014   Procedure: TRANSESOPHAGEAL ECHOCARDIOGRAM (TEE);  Surgeon: Lelon Perla, MD;  Location: [facility] ENDOSCOPY;  Service: Cardiovascular;  Laterality: N/A;  . TONSILLECTOMY      Current Outpatient Medications  Medication Sig Dispense Refill  . atorvastatin (LIPITOR) 40 MG tablet TAKE 1 TABLET ONCE DAILY.  (Patient taking differently: Take 40 mg by mouth once daily) 90 tablet 3  . ELIQUIS 5 MG TABS tablet TAKE 1 TABLET TWICE DAILY. (Patient taking differently: Take 5 mg by mouth twice daily) 180 tablet 1  . ferrous sulfate 325 (65 FE) MG tablet Take 1 tablet (325 mg total) by mouth daily with breakfast. 60 tablet 3  . fluticasone (FLONASE) 50 MCG/ACT nasal spray 1 spray in each nostril daily as needed for stuffiness (Patient taking differently: Place 1 spray into both nostrils daily as needed for allergies. ) 16 g 11  . furosemide (LASIX) 40 MG tablet TAKE 1 TABLET EACH DAY. 90 tablet 0  . loratadine (CLARITIN) 10 MG tablet Take 10 mg by mouth daily as needed for allergies.     Marland Kitchen MAGNESIUM PO Take 6 tablets by mouth at bedtime.     . metoprolol tartrate (LOPRESSOR) 50 MG tablet TAKE 1 TABLET BY MOUTH TWICE DAILY. 180 tablet 3  . Multiple Vitamins-Minerals (MULTIVITAMIN PO) Take 1 tablet by mouth 2 (two) times daily.    . nitroGLYCERIN (NITROSTAT) 0.4 MG SL tablet Place 1 tablet (0.4 mg total) under the tongue every 5 (five) minutes as needed for chest pain. 25 tablet 3  . pantoprazole (PROTONIX) 40 MG tablet TAKE 1 TABLET EACH DAY. (Patient taking differently: Take 40 mg by mouth daily. ) 90 tablet 1  . isosorbide mononitrate (IMDUR) 60 MG 24 hr tablet Take 1 tablet (60 mg total) by mouth daily. 90 tablet 3  . losartan (COZAAR) 50 MG tablet Take 1 tablet (50 mg total) by mouth daily. 90 tablet 3   No current facility-administered medications for this visit.     Allergies:   Patient has no known allergies.   Social History: Social History   Socioeconomic History  . Marital status: Married    Spouse name: Not on file  . Number of children: 3  . Years of education: 47  . Highest education level: Not on file  Social Needs  . Financial resource strain: Not on file  . Food insecurity - worry: Not on file  . Food insecurity - inability: Not on file  . Transportation needs - medical: Not on  file  . Transportation needs - non-medical: Not on file  Occupational History  . Occupation: Therapist, sports: RETIRED  Tobacco Use  . Smoking status: Former Smoker    Packs/day: 0.00    Years: 40.00    Pack years: 0.00    Types: Cigarettes, Cigars    Last attempt to quit: 09/18/2008    Years since quitting: 8.6  . Smokeless tobacco: Former Systems developer    Types: Chew  . Tobacco comment: Has Occasional Cigar/ quit   Substance and Sexual Activity  . Alcohol use: Yes    Alcohol/week: 12.6 oz    Types: 21 Glasses of wine per week  . Drug use: No  . Sexual activity: Not on file  Other  Topics Concern  . Not on file  Social History Narrative   HSG, [employer]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 married 1961. 2 sons- '64, '63 , I daughter- '66, 4 grandchildren. work: Museum/gallery curator, retired but still consults. 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      No new stressors/     Family History: Family History  Problem Relation Age of Onset  . Cancer Mother        ovarian  . Other Father        renal disease- grief over loss of spouse  . Cancer Brother        prostate- died of hematologic disorder 2nd to chemo  . Pulmonary fibrosis Brother   . Diabetes Neg Hx   . Coronary artery disease Neg Hx   . Colon cancer Neg Hx   . Stomach cancer Neg Hx   . Rectal cancer Neg Hx      Review of Systems: All other systems reviewed and are otherwise negative except as noted above.   Physical Exam: VS:  BP 118/66   Pulse 77   Ht 5\' 9"  (1.753 m)   Wt 225 lb (102.1 kg)   BMI 33.23 kg/m  , BMI Body mass index is 33.23 kg/m.  GEN- The patient is elderly appearing, alert and oriented x 3 today.   HEENT: normocephalic, atraumatic; sclera clear, conjunctiva pink; hearing intact; oropharynx clear; neck supple  Lungs- Clear to ausculation bilaterally, normal work of breathing.  No wheezes, rales, rhonchi Heart-  Regular rate and rhythm GI- soft, non-tender, non-distended, bowel sounds present  Extremities- no clubbing, cyanosis, +trace BLE edema  MS- no significant deformity or atrophy Skin- warm and dry, no rash or lesion; PPM pocket well healed Psych- euthymic mood, full affect Neuro- strength and sensation are intact  PPM Interrogation- reviewed in detail today,  See PACEART report  EKG:  EKG is not ordered today.  Recent Labs: 09/06/2016: ALT 14 03/10/2017: BUN 22; Creatinine, Ser 1.03; Hemoglobin 12.7; Platelets 169; Potassium 4.9; Sodium 138   Wt Readings from Last 3 Encounters:  05/05/17 225 lb (102.1 kg)  03/16/17 198 lb (89.8 kg)  03/10/17 224 lb (101.6 kg)     Other studies Reviewed: Additional studies/ records that were reviewed today include: Dr Olin Pia office notes, cath notes  Assessment and Plan:  1.  Symptomatic bradycardia Normal PPM function See Pace Art report No changes today  2.  CAD Moderate non-obstructive CAD by cath Medical therapy recommended Continue statin, BB. No ASA with need for anticoagulation  3.  Paroxysmal atrial fibrillation Burden by device interrogation <1% Continue Eliquis for CHADS2VASC of 4  4.  HTN Stable No change required today   Current medicines are reviewed at length with the patient today.   The patient does not have concerns regarding his medicines.  The following changes were made today:  none  Labs/ tests ordered today include: none Orders Placed This Encounter  Procedures  . CUP PACEART INCLINIC DEVICE CHECK     Disposition:   Follow up with Delilah Shan, Dr Caryl Comes 1 year    Signed, Chanetta Marshall, NP 05/05/2017 8:12 AM  [facility] [address] [place] [address] [phone] (office) [phone] (fax)

## 2017-05-05 ENCOUNTER — Encounter: Payer: Self-pay | Admitting: Nurse Practitioner

## 2017-05-05 ENCOUNTER — Ambulatory Visit: Payer: PPO | Admitting: Nurse Practitioner

## 2017-05-05 VITALS — BP 118/66 | HR 77 | Ht 69.0 in | Wt 225.0 lb

## 2017-05-05 DIAGNOSIS — I2510 Atherosclerotic heart disease of native coronary artery without angina pectoris: Secondary | ICD-10-CM

## 2017-05-05 DIAGNOSIS — H353132 Nonexudative age-related macular degeneration, bilateral, intermediate dry stage: Secondary | ICD-10-CM | POA: Diagnosis not present

## 2017-05-05 DIAGNOSIS — H26491 Other secondary cataract, right eye: Secondary | ICD-10-CM | POA: Diagnosis not present

## 2017-05-05 DIAGNOSIS — H35342 Macular cyst, hole, or pseudohole, left eye: Secondary | ICD-10-CM | POA: Diagnosis not present

## 2017-05-05 DIAGNOSIS — I10 Essential (primary) hypertension: Secondary | ICD-10-CM

## 2017-05-05 DIAGNOSIS — I495 Sick sinus syndrome: Secondary | ICD-10-CM | POA: Diagnosis not present

## 2017-05-05 DIAGNOSIS — I480 Paroxysmal atrial fibrillation: Secondary | ICD-10-CM | POA: Diagnosis not present

## 2017-05-05 DIAGNOSIS — Z961 Presence of intraocular lens: Secondary | ICD-10-CM | POA: Diagnosis not present

## 2017-05-05 LAB — CUP PACEART INCLINIC DEVICE CHECK
Implantable Lead Implant Date: 20171002
Implantable Lead Location: 753860
MDC IDC LEAD IMPLANT DT: 20171002
MDC IDC LEAD LOCATION: 753859
MDC IDC PG IMPLANT DT: 20171002
MDC IDC PG SERIAL: 7951215
MDC IDC SESS DTM: 20181221081049

## 2017-05-05 NOTE — Patient Instructions (Addendum)
Medication Instructions:   Your physician recommends that you continue on your current medications as directed. Please refer to the Current Medication list given to you today.   If you need a refill on your cardiac medications before your next appointment, please call your pharmacy.  Labwork: NONE ORDERED  TODAY    Testing/Procedures: NONE ORDERED  TODAY    Follow-Up: Your physician wants you to follow-up in: [place] will receive a reminder letter in the mail two months in advance. If you don't receive a letter, please call our office to schedule the follow-up appointment.    Remote monitoring is used to monitor your Pacemaker of ICD from home. This monitoring reduces the number of office visits required to check your device to one time per year. It allows [place] to keep an eye on the functioning of your device to ensure it is working properly. You are scheduled for a device check from home on . 05-22-17 You may send your transmission at any time that day. If you have a wireless device, the transmission will be sent automatically. After your physician reviews your transmission, you will receive a postcard with your next transmission date.    Any Other Special Instructions Will Be Listed Below (If Applicable).

## 2017-05-06 ENCOUNTER — Other Ambulatory Visit: Payer: Self-pay | Admitting: Internal Medicine

## 2017-05-17 ENCOUNTER — Other Ambulatory Visit: Payer: Self-pay | Admitting: Internal Medicine

## 2017-05-17 NOTE — Telephone Encounter (Signed)
Pt last saw Dr Caryl Comes 03/10/17, last labs 03/10/17 Creat 1.03, age 84, weight 102.1kg, based on specified criteria pt is on appropriate dosage of Eliquis 5mg  BID.  Will refill rx.

## 2017-05-22 ENCOUNTER — Ambulatory Visit (INDEPENDENT_AMBULATORY_CARE_PROVIDER_SITE_OTHER): Payer: PPO | Admitting: *Deleted

## 2017-05-22 DIAGNOSIS — I495 Sick sinus syndrome: Secondary | ICD-10-CM

## 2017-05-22 NOTE — Progress Notes (Signed)
Remote pacemaker transmission.   

## 2017-05-23 ENCOUNTER — Encounter: Payer: Self-pay | Admitting: Cardiology

## 2017-05-27 ENCOUNTER — Other Ambulatory Visit: Payer: Self-pay | Admitting: Internal Medicine

## 2017-05-29 LAB — CUP PACEART REMOTE DEVICE CHECK
Battery Remaining Longevity: 116 mo
Brady Statistic AP VP Percent: 1 %
Brady Statistic AP VS Percent: 99 %
Brady Statistic AS VP Percent: 1 %
Brady Statistic RA Percent Paced: 99 %
Brady Statistic RV Percent Paced: 1 %
Date Time Interrogation Session: 20190107070014
Implantable Lead Implant Date: 20171002
Implantable Lead Location: 753859
Lead Channel Impedance Value: 390 Ohm
Lead Channel Impedance Value: 510 Ohm
Lead Channel Pacing Threshold Amplitude: 0.625 V
Lead Channel Pacing Threshold Pulse Width: 0.5 ms
Lead Channel Sensing Intrinsic Amplitude: 12 mV
Lead Channel Setting Pacing Amplitude: 2 V
Lead Channel Setting Pacing Pulse Width: 0.5 ms
MDC IDC LEAD IMPLANT DT: 20171002
MDC IDC LEAD LOCATION: 753860
MDC IDC MSMT BATTERY REMAINING PERCENTAGE: 95.5 %
MDC IDC MSMT BATTERY VOLTAGE: 3.01 V
MDC IDC MSMT LEADCHNL RA PACING THRESHOLD AMPLITUDE: 0.5 V
MDC IDC MSMT LEADCHNL RA SENSING INTR AMPL: 2.9 mV
MDC IDC MSMT LEADCHNL RV PACING THRESHOLD PULSEWIDTH: 0.5 ms
MDC IDC PG IMPLANT DT: 20171002
MDC IDC SET LEADCHNL RV PACING AMPLITUDE: 0.875
MDC IDC SET LEADCHNL RV SENSING SENSITIVITY: 2 mV
MDC IDC STAT BRADY AS VS PERCENT: 1 %
Pulse Gen Model: 2272
Pulse Gen Serial Number: 7951215

## 2017-06-01 DIAGNOSIS — G4733 Obstructive sleep apnea (adult) (pediatric): Secondary | ICD-10-CM | POA: Diagnosis not present

## 2017-06-08 ENCOUNTER — Other Ambulatory Visit: Payer: Self-pay

## 2017-06-08 ENCOUNTER — Other Ambulatory Visit (INDEPENDENT_AMBULATORY_CARE_PROVIDER_SITE_OTHER): Payer: PPO

## 2017-06-08 ENCOUNTER — Emergency Department (HOSPITAL_COMMUNITY): Payer: PPO

## 2017-06-08 ENCOUNTER — Encounter: Payer: Self-pay | Admitting: Internal Medicine

## 2017-06-08 ENCOUNTER — Encounter (HOSPITAL_COMMUNITY): Payer: Self-pay

## 2017-06-08 ENCOUNTER — Observation Stay (HOSPITAL_COMMUNITY)
Admission: EM | Admit: 2017-06-08 | Discharge: 2017-06-09 | Disposition: A | Discharge status: Home / Self Care | Payer: PPO | Attending: Emergency Medicine | Admitting: Emergency Medicine

## 2017-06-08 ENCOUNTER — Ambulatory Visit (INDEPENDENT_AMBULATORY_CARE_PROVIDER_SITE_OTHER): Payer: PPO | Admitting: Internal Medicine

## 2017-06-08 VITALS — BP 120/60 | HR 134 | Temp 98.7°F | Ht 69.0 in | Wt 226.0 lb

## 2017-06-08 DIAGNOSIS — I2510 Atherosclerotic heart disease of native coronary artery without angina pectoris: Secondary | ICD-10-CM | POA: Insufficient documentation

## 2017-06-08 DIAGNOSIS — R748 Abnormal levels of other serum enzymes: Secondary | ICD-10-CM | POA: Diagnosis not present

## 2017-06-08 DIAGNOSIS — Z85828 Personal history of other malignant neoplasm of skin: Secondary | ICD-10-CM | POA: Insufficient documentation

## 2017-06-08 DIAGNOSIS — Z23 Encounter for immunization: Secondary | ICD-10-CM

## 2017-06-08 DIAGNOSIS — R0789 Other chest pain: Secondary | ICD-10-CM | POA: Diagnosis not present

## 2017-06-08 DIAGNOSIS — I480 Paroxysmal atrial fibrillation: Secondary | ICD-10-CM

## 2017-06-08 DIAGNOSIS — I200 Unstable angina: Secondary | ICD-10-CM | POA: Diagnosis not present

## 2017-06-08 DIAGNOSIS — Z87891 Personal history of nicotine dependence: Secondary | ICD-10-CM | POA: Diagnosis not present

## 2017-06-08 DIAGNOSIS — D500 Iron deficiency anemia secondary to blood loss (chronic): Secondary | ICD-10-CM | POA: Diagnosis not present

## 2017-06-08 DIAGNOSIS — Z7901 Long term (current) use of anticoagulants: Secondary | ICD-10-CM | POA: Insufficient documentation

## 2017-06-08 DIAGNOSIS — R000 Tachycardia, unspecified: Secondary | ICD-10-CM | POA: Diagnosis not present

## 2017-06-08 DIAGNOSIS — D539 Nutritional anemia, unspecified: Secondary | ICD-10-CM

## 2017-06-08 DIAGNOSIS — I700 Atherosclerosis of aorta: Secondary | ICD-10-CM | POA: Diagnosis not present

## 2017-06-08 DIAGNOSIS — R0602 Shortness of breath: Secondary | ICD-10-CM | POA: Diagnosis not present

## 2017-06-08 DIAGNOSIS — M7989 Other specified soft tissue disorders: Secondary | ICD-10-CM | POA: Diagnosis not present

## 2017-06-08 DIAGNOSIS — N289 Disorder of kidney and ureter, unspecified: Secondary | ICD-10-CM | POA: Diagnosis not present

## 2017-06-08 DIAGNOSIS — Z79899 Other long term (current) drug therapy: Secondary | ICD-10-CM | POA: Insufficient documentation

## 2017-06-08 DIAGNOSIS — I4891 Unspecified atrial fibrillation: Secondary | ICD-10-CM

## 2017-06-08 DIAGNOSIS — Z950 Presence of cardiac pacemaker: Secondary | ICD-10-CM | POA: Diagnosis not present

## 2017-06-08 DIAGNOSIS — I10 Essential (primary) hypertension: Secondary | ICD-10-CM | POA: Diagnosis not present

## 2017-06-08 DIAGNOSIS — R079 Chest pain, unspecified: Secondary | ICD-10-CM | POA: Insufficient documentation

## 2017-06-08 DIAGNOSIS — R7989 Other specified abnormal findings of blood chemistry: Secondary | ICD-10-CM | POA: Diagnosis not present

## 2017-06-08 DIAGNOSIS — R778 Other specified abnormalities of plasma proteins: Secondary | ICD-10-CM

## 2017-06-08 LAB — CBC WITH DIFFERENTIAL/PLATELET
BASOS ABS: 0 10*3/uL (ref 0.0–0.1)
BASOS PCT: 0.5 % (ref 0.0–3.0)
EOS ABS: 0.2 10*3/uL (ref 0.0–0.7)
Eosinophils Relative: 2.9 % (ref 0.0–5.0)
HEMATOCRIT: 41.1 % (ref 39.0–52.0)
Hemoglobin: 14.1 g/dL (ref 13.0–17.0)
LYMPHS ABS: 1.6 10*3/uL (ref 0.7–4.0)
LYMPHS PCT: 21.1 % (ref 12.0–46.0)
MCHC: 34.2 g/dL (ref 30.0–36.0)
MCV: 99.3 fl (ref 78.0–100.0)
MONO ABS: 0.8 10*3/uL (ref 0.1–1.0)
Monocytes Relative: 9.9 % (ref 3.0–12.0)
NEUTROS ABS: 5.1 10*3/uL (ref 1.4–7.7)
NEUTROS PCT: 65.6 % (ref 43.0–77.0)
PLATELETS: 183 10*3/uL (ref 150.0–400.0)
RBC: 4.14 Mil/uL — ABNORMAL LOW (ref 4.22–5.81)
RDW: 13.2 % (ref 11.5–15.5)
WBC: 7.8 10*3/uL (ref 4.0–10.5)

## 2017-06-08 LAB — CARDIAC PANEL
CK MB: 2.2 ng/mL (ref 0.3–4.0)
CK TOTAL: 54 U/L (ref 7–232)
RELATIVE INDEX: 4.1 calc — AB (ref 0.0–2.5)

## 2017-06-08 LAB — BASIC METABOLIC PANEL
ANION GAP: 11 (ref 5–15)
BUN: 30 mg/dL — ABNORMAL HIGH (ref 6–20)
CHLORIDE: 105 mmol/L (ref 101–111)
CO2: 24 mmol/L (ref 22–32)
Calcium: 9.4 mg/dL (ref 8.9–10.3)
Creatinine, Ser: 1.26 mg/dL — ABNORMAL HIGH (ref 0.61–1.24)
GFR calc non Af Amer: 51 mL/min — ABNORMAL LOW (ref >60)
GFR, EST AFRICAN AMERICAN: 60 mL/min — AB (ref >60)
GLUCOSE: 110 mg/dL — AB (ref 65–99)
Potassium: 5.1 mmol/L (ref 3.5–5.1)
Sodium: 140 mmol/L (ref 135–145)

## 2017-06-08 LAB — LIPID PANEL
CHOL/HDL RATIO: 3
CHOLESTEROL: 107 mg/dL (ref 0–200)
HDL: 40.8 mg/dL (ref >39.00)
LDL Cholesterol: 49 mg/dL (ref 0–99)
NonHDL: 66.36
TRIGLYCERIDES: 85 mg/dL (ref 0.0–149.0)
VLDL: 17 mg/dL (ref 0.0–40.0)

## 2017-06-08 LAB — CBC
HEMATOCRIT: 42.4 % (ref 39.0–52.0)
HEMOGLOBIN: 14 g/dL (ref 13.0–17.0)
MCH: 33.1 pg (ref 26.0–34.0)
MCHC: 33 g/dL (ref 30.0–36.0)
MCV: 100.2 fL — AB (ref 78.0–100.0)
Platelets: 189 10*3/uL (ref 150–400)
RBC: 4.23 MIL/uL (ref 4.22–5.81)
RDW: 13.7 % (ref 11.5–15.5)
WBC: 8.3 10*3/uL (ref 4.0–10.5)

## 2017-06-08 LAB — FERRITIN: Ferritin: 34.4 ng/mL (ref 22.0–322.0)

## 2017-06-08 LAB — IBC PANEL
IRON: 78 ug/dL (ref 42–165)
SATURATION RATIOS: 19.8 % — AB (ref 20.0–50.0)
TRANSFERRIN: 282 mg/dL (ref 212.0–360.0)

## 2017-06-08 LAB — I-STAT TROPONIN, ED: Troponin i, poc: 0 ng/mL (ref 0.00–0.08)

## 2017-06-08 LAB — TROPONIN I
TNIDX: 0.08 ug/l — ABNORMAL HIGH (ref 0.00–0.06)
Troponin I: 0.08 ng/mL (ref <0.03)

## 2017-06-08 LAB — PROTIME-INR
INR: 1.52
PROTHROMBIN TIME: 18.2 s — AB (ref 11.4–15.2)

## 2017-06-08 LAB — BRAIN NATRIURETIC PEPTIDE: Pro B Natriuretic peptide (BNP): 265 pg/mL — ABNORMAL HIGH (ref 0.0–100.0)

## 2017-06-08 MED ORDER — DILTIAZEM HCL 25 MG/5ML IV SOLN
10.0000 mg | Freq: Once | INTRAVENOUS | Status: AC
  Administered 2017-06-08: 10 mg via INTRAVENOUS

## 2017-06-08 MED ORDER — DILTIAZEM HCL-DEXTROSE 100-5 MG/100ML-% IV SOLN (PREMIX)
5.0000 mg/h | Freq: Once | INTRAVENOUS | Status: AC
  Administered 2017-06-08: 5 mg/h via INTRAVENOUS
  Filled 2017-06-08: qty 100

## 2017-06-08 MED ORDER — FERROUS SULFATE 325 (65 FE) MG PO TABS
325.0000 mg | ORAL_TABLET | Freq: Every day | ORAL | Status: DC
  Administered 2017-06-09: 325 mg via ORAL
  Filled 2017-06-08: qty 1

## 2017-06-08 MED ORDER — ZOSTER VAC RECOMB ADJUVANTED 50 MCG/0.5ML IM SUSR: 0.5000 mL | Freq: Once | INTRAMUSCULAR | 1 refills | Status: DC

## 2017-06-08 MED ORDER — MAGNESIUM OXIDE 400 (241.3 MG) MG PO TABS
ORAL_TABLET | Freq: Every day | ORAL | Status: DC
  Administered 2017-06-08: 400 mg via ORAL
  Filled 2017-06-08: qty 1

## 2017-06-08 MED ORDER — APIXABAN 5 MG PO TABS
5.0000 mg | ORAL_TABLET | Freq: Two times a day (BID) | ORAL | Status: DC
  Administered 2017-06-08 – 2017-06-09 (×2): 5 mg via ORAL
  Filled 2017-06-08 (×2): qty 1

## 2017-06-08 MED ORDER — PANTOPRAZOLE SODIUM 40 MG PO TBEC
40.0000 mg | DELAYED_RELEASE_TABLET | Freq: Every day | ORAL | Status: DC
  Administered 2017-06-08 – 2017-06-09 (×2): 40 mg via ORAL
  Filled 2017-06-08 (×2): qty 1

## 2017-06-08 MED ORDER — ATORVASTATIN CALCIUM 40 MG PO TABS
40.0000 mg | ORAL_TABLET | Freq: Every day | ORAL | Status: DC
  Administered 2017-06-09: 40 mg via ORAL
  Filled 2017-06-08: qty 1

## 2017-06-08 MED ORDER — ACETAMINOPHEN 325 MG PO TABS: 650.0000 mg | ORAL_TABLET | ORAL | Status: DC | PRN

## 2017-06-08 MED ORDER — ISOSORBIDE MONONITRATE ER 60 MG PO TB24
60.0000 mg | ORAL_TABLET | Freq: Every day | ORAL | Status: DC
  Administered 2017-06-09: 60 mg via ORAL
  Filled 2017-06-08: qty 1

## 2017-06-08 MED ORDER — METOPROLOL TARTRATE 50 MG PO TABS
50.0000 mg | ORAL_TABLET | Freq: Two times a day (BID) | ORAL | Status: DC
  Administered 2017-06-08 – 2017-06-09 (×2): 50 mg via ORAL
  Filled 2017-06-08 (×2): qty 1

## 2017-06-08 MED ORDER — NITROGLYCERIN 0.4 MG SL SUBL: 0.4000 mg | SUBLINGUAL_TABLET | SUBLINGUAL | Status: DC | PRN

## 2017-06-08 NOTE — ED Notes (Signed)
Cards at the bedside

## 2017-06-08 NOTE — ED Notes (Signed)
Ordered meal tray for pt 

## 2017-06-08 NOTE — ED Triage Notes (Signed)
Per Pt, Pt is coming from PCP with reports of tachycardia. Denies any CP or SOB. Has hx of Afib.

## 2017-06-08 NOTE — Patient Instructions (Signed)
Nonspecific Chest Pain °Chest pain can be caused by many different conditions. There is always a chance that your pain could be related to something serious, such as a heart attack or a blood clot in your lungs. Chest pain can also be caused by conditions that are not life-threatening. If you have chest pain, it is very important to follow up with your health care provider. °What are the causes? °Causes of this condition include: °· Heartburn. °· Pneumonia or bronchitis. °· Anxiety or stress. °· Inflammation around your heart (pericarditis) or lung (pleuritis or pleurisy). °· A blood clot in your lung. °· A collapsed lung (pneumothorax). This can develop suddenly on its own (spontaneous pneumothorax) or from trauma to the chest. °· Shingles infection (varicella-zoster virus). °· Heart attack. °· Damage to the bones, muscles, and cartilage that make up your chest wall. This can include: °? Bruised bones due to injury. °? Strained muscles or cartilage due to frequent or repeated coughing or overwork. °? Fracture to one or more ribs. °? Sore cartilage due to inflammation (costochondritis). ° °What increases the risk? °Risk factors for this condition may include: °· Activities that increase your risk for trauma or injury to your chest. °· Respiratory infections or conditions that cause frequent coughing. °· Medical conditions or overeating that can cause heartburn. °· Heart disease or family history of heart disease. °· Conditions or health behaviors that increase your risk of developing a blood clot. °· Having had chicken pox (varicella zoster). ° °What are the signs or symptoms? °Chest pain can feel like: °· Burning or tingling on the surface of your chest or deep in your chest. °· Crushing, pressure, aching, or squeezing pain. °· Dull or sharp pain that is worse when you move, cough, or take a deep breath. °· Pain that is also felt in your back, neck, shoulder, or arm, or pain that spreads to any of these  areas. ° °Your chest pain may come and go, or it may stay constant. °How is this diagnosed? °Lab tests or other studies may be needed to find the cause of your pain. Your health care provider may have you take a test called an ECG (electrocardiogram). An ECG records your heartbeat patterns at the time the test is performed. You may also have other tests, such as: °· Transthoracic echocardiogram (TTE). In this test, sound waves are used to create a picture of the heart structures and to look at how blood flows through your heart. °· Transesophageal echocardiogram (TEE). This is a more advanced imaging test that takes images from inside your body. It allows your health care provider to see your heart in finer detail. °· Cardiac monitoring. This allows your health care provider to monitor your heart rate and rhythm in real time. °· Holter monitor. This is a portable device that records your heartbeat and can help to diagnose abnormal heartbeats. It allows your health care provider to track your heart activity for several days, if needed. °· Stress tests. These can be done through exercise or by taking medicine that makes your heart beat more quickly. °· Blood tests. °· Other imaging tests. ° °How is this treated? °Treatment depends on what is causing your chest pain. Treatment may include: °· Medicines. These may include: °? Acid blockers for heartburn. °? Anti-inflammatory medicine. °? Pain medicine for inflammatory conditions. °? Antibiotic medicine, if an infection is present. °? Medicines to dissolve blood clots. °? Medicines to treat coronary artery disease (CAD). °· Supportive care for conditions that   do not require medicines. This may include: °? Resting. °? Applying heat or cold packs to injured areas. °? Limiting activities until pain decreases. ° °Follow these instructions at home: °Medicines °· If you were prescribed an antibiotic, take it as told by your health care provider. Do not stop taking the  antibiotic even if you start to feel better. °· Take over-the-counter and prescription medicines only as told by your health care provider. °Lifestyle °· Do not use any products that contain nicotine or tobacco, such as cigarettes and e-cigarettes. If you need help quitting, ask your health care provider. °· Do not drink alcohol. °· Make lifestyle changes as directed by your health care provider. These may include: °? Getting regular exercise. Ask your health care provider to suggest some activities that are safe for you. °? Eating a heart-healthy diet. A registered dietitian can help you to learn healthy eating options. °? Maintaining a healthy weight. °? Managing diabetes, if necessary. °? Reducing stress, such as with yoga or relaxation techniques. °General instructions °· Avoid any activities that bring on chest pain. °· If heartburn is the cause for your chest pain, raise (elevate) the head of your bed about 6 inches (15 cm) by putting blocks under the legs. Sleeping with more pillows does not effectively relieve heartburn because it only changes the position of your head. °· Keep all follow-up visits as told by your health care provider. This is important. This includes any further testing if your chest pain does not go away. °Contact a health care provider if: °· Your chest pain does not go away. °· You have a rash with blisters on your chest. °· You have a fever. °· You have chills. °Get help right away if: °· Your chest pain is worse. °· You have a cough that gets worse, or you cough up blood. °· You have severe pain in your abdomen. °· You have severe weakness. °· You faint. °· You have sudden, unexplained chest discomfort. °· You have sudden, unexplained discomfort in your arms, back, neck, or jaw. °· You have shortness of breath at any time. °· You suddenly start to sweat, or your skin gets clammy. °· You feel nauseous or you vomit. °· You suddenly feel light-headed or dizzy. °· Your heart begins to beat  quickly, or it feels like it is skipping beats. °These symptoms may represent a serious problem that is an emergency. Do not wait to see if the symptoms will go away. Get medical help right away. Call your local emergency services (911 in the [place] Do not drive yourself to the hospital. °This information is not intended to replace advice given to you by your health care provider. Make sure you discuss any questions you have with your health care provider. °Document Released: 02/09/2005 Document Revised: 01/25/2016 Document Reviewed: 01/25/2016 °Elsevier Interactive Patient Education © 2017 [employer] ° °

## 2017-06-08 NOTE — ED Notes (Signed)
Paged admitting per RN  

## 2017-06-08 NOTE — Progress Notes (Signed)
Subjective:  Patient ID: Donald [name], male    DOB: 1932/11/20  Age: 84 y.o. MRN: [id number]  CC: Anemia   HPI Donald [name] presents for a one-week history of chest pain that he describes as tightness with exertion and DOE.  He used nitroglycerin for the chest tightness and says that his pain resolved.  He had a cardiac cath about 2 or 3 months ago that showed a moderate amount of CAD.  Outpatient Medications Prior to Visit  Medication Sig Dispense Refill  . atorvastatin (LIPITOR) 40 MG tablet TAKE 1 TABLET ONCE DAILY. 90 tablet 0  . ELIQUIS 5 MG TABS tablet TAKE 1 TABLET BY MOUTH TWICE DAILY. 60 tablet 9  . ferrous sulfate 325 (65 FE) MG tablet Take 1 tablet (325 mg total) by mouth daily with breakfast. 60 tablet 3  . fluticasone (FLONASE) 50 MCG/ACT nasal spray 1 spray in each nostril daily as needed for stuffiness (Patient taking differently: Place 1 spray into both nostrils daily as needed for allergies. ) 16 g 11  . furosemide (LASIX) 40 MG tablet TAKE 1 TABLET EACH DAY. 90 tablet 0  . isosorbide mononitrate (IMDUR) 60 MG 24 hr tablet TAKE 1 TABLET ONCE DAILY. 90 tablet 3  . loratadine (CLARITIN) 10 MG tablet Take 10 mg by mouth daily as needed for allergies.     Marland Kitchen MAGNESIUM PO Take 6 tablets by mouth at bedtime.     . metoprolol tartrate (LOPRESSOR) 50 MG tablet TAKE 1 TABLET BY MOUTH TWICE DAILY. 180 tablet 3  . Multiple Vitamins-Minerals (MULTIVITAMIN PO) Take 1 tablet by mouth 2 (two) times daily.    . nitroGLYCERIN (NITROSTAT) 0.4 MG SL tablet Place 1 tablet (0.4 mg total) under the tongue every 5 (five) minutes as needed for chest pain. 25 tablet 3  . pantoprazole (PROTONIX) 40 MG tablet TAKE 1 TABLET EACH DAY. (Patient taking differently: Take 40 mg by mouth daily. ) 90 tablet 1  . losartan (COZAAR) 50 MG tablet Take 1 tablet (50 mg total) by mouth daily. 90 tablet 3   No facility-administered medications prior to visit.     ROS Review of Systems  Constitutional:  Positive for fatigue. Negative for diaphoresis and unexpected weight change.  HENT: Negative.   Eyes: Negative for visual disturbance.  Respiratory: Positive for chest tightness and shortness of breath. Negative for cough and wheezing.   Cardiovascular: Positive for chest pain and leg swelling. Negative for palpitations.  Gastrointestinal: Negative for abdominal pain and diarrhea.  Endocrine: Negative.   Genitourinary: Negative.  Negative for difficulty urinating and hematuria.  Musculoskeletal: Negative.   Skin: Negative.   Allergic/Immunologic: Negative.   Neurological: Negative.  Negative for dizziness, weakness, light-headedness and headaches.  Hematological: Negative for adenopathy. Does not bruise/bleed easily.  Psychiatric/Behavioral: Negative.     Objective:  BP 120/60 (BP Location: Right Arm, Patient Position: Sitting, Cuff Size: Large)   Pulse (!) 134   Temp 98.7 F (37.1 C) (Oral)   Ht 5\' 9"  (1.753 m)   Wt 226 lb (102.5 kg)   SpO2 96%   BMI 33.37 kg/m   BP Readings from Last 3 Encounters:  06/08/17 120/60  05/05/17 118/66  03/16/17 113/61    Wt Readings from Last 3 Encounters:  06/08/17 226 lb (102.5 kg)  05/05/17 225 lb (102.1 kg)  03/16/17 198 lb (89.8 kg)    Physical Exam  Constitutional: He is oriented to person, place, and time. No distress.  HENT:  Mouth/Throat: Oropharynx  is clear and moist. No oropharyngeal exudate.  Eyes: Conjunctivae are normal. Left eye exhibits no discharge. No scleral icterus.  Neck: Normal range of motion. Neck supple. No JVD present. No thyromegaly present.  Cardiovascular: Intact distal pulses. An irregularly irregular rhythm present. Tachycardia present. Exam reveals no gallop.  No murmur heard. EKG --  Atrial fibrillation  ABNORMAL RHYTHM- this is a new finding  Pulmonary/Chest: Effort normal. No respiratory distress. He has no decreased breath sounds. He has no wheezes. He has no rhonchi. He has rales in the right lower  field and the left lower field.  Abdominal: Soft. Bowel sounds are normal. He exhibits no mass. There is no tenderness.  Musculoskeletal: He exhibits edema (trace pitting edema in BLE). He exhibits no tenderness or deformity.  Neurological: He is alert and oriented to person, place, and time.  Skin: Skin is warm and dry. No rash noted. He is not diaphoretic. No erythema. No pallor.  Vitals reviewed.   Lab Results  Component Value Date   WBC 6.9 03/10/2017   HGB 12.7 (L) 03/10/2017   HCT 36.9 (L) 03/10/2017   PLT 169 03/10/2017   GLUCOSE 95 03/10/2017   CHOL 140 03/07/2016   TRIG 90.0 03/07/2016   HDL 51.90 03/07/2016   LDLDIRECT 138.8 04/18/2011   LDLCALC 70 03/07/2016   ALT 14 09/06/2016   AST 18 09/06/2016   NA 138 03/10/2017   K 4.9 03/10/2017   CL 101 03/10/2017   CREATININE 1.03 03/10/2017   BUN 22 03/10/2017   CO2 26 03/10/2017   TSH 0.56 03/07/2016   PSA 0.83 11/20/2013   INR 1.29 04/21/2015    No results found.  Assessment & Plan:   Donald [name] was seen today for anemia.  Diagnoses and all orders for this visit:  Deficiency anemia-I will recheck his H&H. -     CBC with Differential/Platelet; Future  Need for shingles vaccine -     Zoster Vaccine Adjuvanted [facility]) injection; Inject 0.5 mLs into the muscle once for 1 dose.  Tachycardia -     EKG 12-Lead -     Thyroid Panel With TSH; Future  Other chest pain- His symptoms are very suspicious for angina.  He has a slightly elevated troponin.  I have advised him to go straight to the ED for urgent evaluation and treatment. -     Brain natriuretic peptide; Future -     Cardiac panel; Future -     Cancel: Troponin I; Future -     Troponin I; Future  PAF (paroxysmal atrial fibrillation) ([place])- He has atrial fibrillation with rapid ventricular response.  This may be contributing to the ischemia.  He has been referred to the ED. -     Thyroid Panel With TSH; Future  ATHEROSCLEROSIS, AORTIC- I will treat his  risk factors to prevent complications related to this. -     Lipid panel; Future  Iron deficiency anemia due to chronic blood loss- I will recheck his H&H and his iron level. -     IBC panel; Future -     Ferritin; Future  Coronary artery disease involving native coronary artery of native heart without angina pectoris- He is symptomatic with relation to this.  He will be seen in the ED today. -     Cardiac panel; Future -     Cancel: Troponin I; Future -     Troponin I; Future   I am having Donald [name] start on Zoster Vaccine Adjuvanted.  I am also having him maintain his loratadine, ferrous sulfate, MAGNESIUM PO, fluticasone, nitroGLYCERIN, losartan, pantoprazole, Multiple Vitamins-Minerals (MULTIVITAMIN PO), metoprolol tartrate, furosemide, atorvastatin, ELIQUIS, and isosorbide mononitrate.  Meds ordered this encounter  Medications  . Zoster Vaccine Adjuvanted [facility] injection    Sig: Inject 0.5 mLs into the muscle once for 1 dose.    Dispense:  0.5 mL    Refill:  1     Follow-up: Return in about 1 day (around 06/09/2017).  [name], MD

## 2017-06-08 NOTE — H&P (Signed)
Cardiology Admission History and Physical:   Patient ID: Donald [name]; MRN: [id number]; DOB: 1933/04/01   Admission date: 06/08/2017  Primary Care Provider: Janith Lima, MD Primary Cardiologist: Virl Axe, MD   Chief Complaint:  Sent from PCP office for Afib w/ RVR  Patient Profile:   Donald [name] is a 84 y.o. male with a history of atrial flutter s/p aflutter ablation in 2015, atrial fibrillation, SSS s/p PPM, nonobstructive CAD by recent cath, chronic diastolic HF, HTN, HLD and ETOH abuse, sent to ED from PCP office for management of atrial fibrillation w/ RVR.  History of Present Illness:   84 y/o male, followed by Dr. Caryl Comes. He has a h/o paroxsymal atrial flutter with associated tachycardia mediated cardiomyopathy. He has been on chronic anticoagulation with Eliquis. He underwent RFCA (flutter ablation) by Dr. Caryl Comes 03/2014 and his LV function improved once in normal sinus rthythm. His most recent 2D echo 02/2016  showed normal EF at 60-65%. He has also had issues with atrial fibrillation. In 2017, he underwent PPM implant for SSS/ symptomatic bradycardia. In Nov 2018, he had a LHC that showed moderate non-obstructive CAD with normal LV systolic function and normal LV filling pressures. Medical management recommended. Also with h/o functionally bicuspid aortic valve (no stenosis noted on last echo), diastolic HF, HTN, HLD, ETOH abuse and skin CA.  Pt presented to his PCP office, Dr. Ronnald Ramp, today mainly for shingles vaccine. Routine vital signs checked and he was noted to be tachycardiac with an irregular pulse, leading to EKG which confirmed atrial fibrillation w/ RVR. Upon further questioning, he noted an episode of substernal chest pain that occurred about 1 week ago. Occurred at rest. Heaviness in chest. Lasted ~2 hrs and resolved with SL NTG. He denies any recurrence since that day last week. He also notes exertional dyspnea with moderate activities such as walking up steep  hills/inclines. He denies dyspnea with basic ADLS. Dr. Ronnald Ramp checked labs. Office troponin was mildly elevated at 0.08. BNP was 265. Pt sent to ED.    Hospital labs in ED show normal H/H. BMP with renal insufficiency. SCr 1.26/BUN 30. K is WNL at 5.1 TSH pending. POC troponin in the ED is negative. EKG shows afib w/ RVR in the 140s. Pt has been started on IV Cardizem. HR improved but still tach. Rhythm appears regular, ? Atrial flutter. He is comfortable at rest. He is typically compliant with meds but admits that he missed a morning dose of all of his meds, including Eliquis, last week. He drinks a moderate amount of caffeine and ETOH ~3 cups of coffee a day and a "couple of mixed drinks and wine a day".    Past Medical History:  Diagnosis Date  . Abnormal CT scan, stomach 02/2014   Thickening of gastric fundus and cardia.  gastritis on EGD 03/2014  . Allergic rhinitis due to pollen   . Arthropathy, unspecified, site unspecified   . Atherosclerosis    a. Noted by abdominal CT 02/2014 (h/o normal nuc 2011).  . Atrial flutter ([place]) 02/24/14   s/p ablation 11/15  . Cardiomyopathy ([place])    tachy mediated - a. TEE (10/15):  EF 30%;  b. Echo after NSR restored (10/15):  mild LVH, EF 55-60%, mild AS, mild AI, mild MR, mild to mod LAE, mild RAE  . Diverticulosis    severe in descending, sigmoid colon.   . ED (erectile dysfunction)   . Esophageal stricture 03/2014   traversable with endoscope. not dilated.   Marland Kitchen  Gastric AVM   . GERD (gastroesophageal reflux disease) 03/2014   small HH and gastritis on EGD  . GI bleed   . Habitual alcohol use   . Hemorrhoids   . Hiatal hernia   . Hyperlipidemia   . Hypertension   . Obesity   . OSA on CPAP   . PAF (paroxysmal atrial fibrillation) ([place])   . Prostatitis, unspecified   . 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 Skin cancer    basal and squamous cell    Past Surgical History:  Procedure Laterality Date  . ATRIAL FLUTTER ABLATION  N/A 03/19/2014   RFCA of atrial flutter by Dr Caryl Comes  . CARDIOVERSION N/A 02/24/2014   Procedure: CARDIOVERSION;  Surgeon: Lelon Perla, MD;  Location: [facility];  Service: Cardiovascular;  Laterality: N/A;  . CARDIOVERSION Right 09/10/2014   Procedure: CARDIOVERSION;  Surgeon: Deboraha Sprang, MD;  Location: [facility] CATH LAB;  Service: Cardiovascular;  Laterality: Right;  . COLONOSCOPY N/A 04/23/2015   Procedure: COLONOSCOPY;  Surgeon: Ladene Artist, MD;  Location: [facility] ENDOSCOPY;  Service: Endoscopy;  Laterality: N/A;  . ENTEROSCOPY N/A 06/12/2015   Procedure: ENTEROSCOPY;  Surgeon: Ladene Artist, MD;  Location: [facility] ENDOSCOPY;  Service: Endoscopy;  Laterality: N/A;  . EP IMPLANTABLE DEVICE N/A 02/15/2016   Procedure: Pacemaker Implant;  Surgeon: Evans Lance, MD;  Location: [place] CV LAB;  Service: Cardiovascular;  Laterality: N/A;  . ESOPHAGOGASTRODUODENOSCOPY N/A 04/09/2014   Procedure: ESOPHAGOGASTRODUODENOSCOPY (EGD);  Surgeon: Inda Castle, MD;  Location: [facility];  Service: Endoscopy;  Laterality: N/A;  . ESOPHAGOGASTRODUODENOSCOPY N/A 04/23/2015   Procedure: ESOPHAGOGASTRODUODENOSCOPY (EGD);  Surgeon: Ladene Artist, MD;  Location: [facility] ENDOSCOPY;  Service: Endoscopy;  Laterality: N/A;  . INGUINAL HERNIA REPAIR     right  . INGUINAL HERNIA REPAIR     left  . LEFT HEART CATH AND CORONARY ANGIOGRAPHY N/A 03/16/2017   Procedure: LEFT HEART CATH AND CORONARY ANGIOGRAPHY;  Surgeon: Burnell Blanks, MD;  Location: [place] CV LAB;  Service: Cardiovascular;  Laterality: N/A;  . ORIF fracture of the elbow    . SKIN CANCER EXCISION  05/21/12   Squamous cell ca  . TEE WITHOUT CARDIOVERSION N/A 02/24/2014   Procedure: TRANSESOPHAGEAL ECHOCARDIOGRAM (TEE);  Surgeon: Lelon Perla, MD;  Location: [facility] ENDOSCOPY;  Service: Cardiovascular;  Laterality: N/A;  . TONSILLECTOMY       Medications Prior to Admission: Prior to Admission medications   Medication Sig Start Date  End Date Taking? Authorizing Provider  atorvastatin (LIPITOR) 40 MG tablet TAKE 1 TABLET ONCE DAILY. Patient taking differently: TAKE 1 TABLET (40mg ) ONCE DAILY. 05/06/17  Yes Janith Lima, MD  ELIQUIS 5 MG TABS tablet TAKE 1 TABLET BY MOUTH TWICE DAILY. Patient taking differently: TAKE 1 TABLET (5mg ) BY MOUTH TWICE DAILY. 05/17/17  Yes Deboraha Sprang, MD  ferrous sulfate 325 (65 FE) MG tablet Take 1 tablet (325 mg total) by mouth daily with breakfast. 04/25/15  Yes Reyne Dumas, MD  fluticasone (FLONASE) 50 MCG/ACT nasal spray 1 spray in each nostril daily as needed for stuffiness Patient taking differently: Place 1 spray into both nostrils daily as needed for allergies.  03/09/16  Yes Janith Lima, MD  furosemide (LASIX) 40 MG tablet TAKE 1 TABLET EACH DAY. Patient taking differently: TAKE 1 TABLET (40mg )  EACH DAY. 04/20/17  Yes Janith Lima, MD  isosorbide mononitrate (IMDUR) 60 MG 24 hr tablet TAKE 1  TABLET ONCE DAILY. Patient taking differently: TAKE 1 TABLET (60mg ) ONCE DAILY. 05/29/17  Yes Deboraha Sprang, MD  loratadine (CLARITIN) 10 MG tablet Take 10 mg by mouth daily as needed for allergies.    Yes [provider]  MAGNESIUM PO Take 6 tablets by mouth at bedtime.    Yes [provider]  metoprolol tartrate (LOPRESSOR) 50 MG tablet TAKE 1 TABLET BY MOUTH TWICE DAILY. Patient taking differently: TAKE 1 TABLET (50 mg) BY MOUTH TWICE DAILY. 03/28/17  Yes Deboraha Sprang, MD  Multiple Vitamins-Minerals (MULTIVITAMIN PO) Take 1 tablet by mouth 2 (two) times daily.   Yes [provider]  nitroGLYCERIN (NITROSTAT) 0.4 MG SL tablet Place 1 tablet (0.4 mg total) under the tongue every 5 (five) minutes as needed for chest pain. 05/23/16  Yes Deboraha Sprang, MD  pantoprazole (PROTONIX) 40 MG tablet TAKE 1 TABLET EACH DAY. Patient taking differently: Take 40 mg by mouth daily.  12/08/16  Yes Pyrtle, Lajuan Lines, MD  losartan (COZAAR) 50 MG tablet Take 1 tablet (50 mg  total) by mouth daily. 05/23/16 03/10/17  Deboraha Sprang, MD  Zoster Vaccine Adjuvanted [facility]) injection Inject 0.5 mLs into the muscle once for 1 dose. 06/08/17 06/08/17  Janith Lima, MD     Allergies:   No Known Allergies  Social History:   Social History   Socioeconomic History  . Marital status: Married    Spouse name: Not on file  . Number of children: 3  . Years of education: 43  . Highest education level: Not on file  Social Needs  . Financial resource strain: Not on file  . Food insecurity - worry: Not on file  . Food insecurity - inability: Not on file  . Transportation needs - medical: Not on file  . Transportation needs - non-medical: Not on file  Occupational History  . Occupation: Therapist, sports: RETIRED  Tobacco Use  . Smoking status: Former Smoker    Packs/day: 0.00    Years: 40.00    Pack years: 0.00    Types: Cigarettes, Cigars    Last attempt to quit: 09/18/2008    Years since quitting: 8.7  . Smokeless tobacco: Former Systems developer    Types: Chew  . Tobacco comment: Has Occasional Cigar/ quit   Substance and Sexual Activity  . Alcohol use: Yes    Alcohol/week: 16.8 oz    Types: 21 Glasses of wine, 7 Shots of liquor per week  . Drug use: No  . Sexual activity: Not on file  Other Topics Concern  . 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 married 1961. 2 sons- '64, '63 , I daughter- '66, 4 grandchildren. work: Museum/gallery curator, retired but still consults. 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      No new stressors/     Family History:   The patient's family history includes Cancer in his brother and mother; Other in his father; Pulmonary fibrosis in his brother. There is no history of Diabetes, Coronary artery disease, Colon cancer, Stomach cancer, or Rectal cancer.    ROS:  Please see the history of present illness.    All other ROS reviewed and negative.     Physical Exam/Data:   Vitals:   06/08/17 1324 06/08/17 1345 06/08/17 1400 06/08/17 1415  BP: 115/66 112/77 112/82 100/84  Pulse:  Marland Kitchen)  139 (!) 117 (!) 122  Resp:  19 (!) 22 (!) 22  Temp:      TempSrc:      SpO2:  97% 97% 95%  Weight:      Height:       No intake or output data in the 24 hours ending 06/08/17 1426 Filed Weights   06/08/17 1202  Weight: 226 lb (102.5 kg)   Body mass index is 33.37 kg/m.  General:  Well nourished, well developed, in no acute distress HEENT: normal Lymph: no adenopathy Neck: no JVD Endocrine:  No thryomegaly Vascular: No carotid bruits; FA pulses 2+ bilaterally without bruits  Cardiac:  Irregular rhythm, tachy rate  Lungs:  Decreased BS at the bases. No crackles. Abd: soft, nontender, no hepatomegaly  Ext: no edema Musculoskeletal:  No deformities, BUE and BLE strength normal and equal Skin: warm and dry  Neuro:  CNs 2-12 intact, no focal abnormalities noted Psych:  Normal affect    EKG:  The ECG that was done 06/08/2017 was personally reviewed and demonstrates atrial fibrillation 141 bpm   Relevant CV Studies: Procedures   LEFT HEART CATH AND CORONARY ANGIOGRAPHY 03/2017  Conclusion     Prox Cx lesion, 50 %stenosed.  Mid LAD lesion, 50 %stenosed.  Mid RCA lesion, 20 %stenosed.  Dist RCA lesion, 30 %stenosed.  The left ventricular systolic function is normal.  LV end diastolic pressure is normal.  The left ventricular ejection fraction is 55-65% by visual estimate.  There is no mitral valve regurgitation.   1. Moderate non-obstructive CAD 2. Normal LV systolic function 3. Normal LV filling pressures  Recommendations: Medical management of CAD. Aggressive risk factor reduction.    Limited 2D Echo 02/2016 Study Conclusions  - Left ventricle: The cavity size was normal. There was mild   concentric hypertrophy. Systolic function was normal. The   estimated ejection fraction  was in the range of 60% to 65%. Wall   motion was normal; there were no regional wall motion   abnormalities. Doppler parameters are consistent with abnormal   left ventricular relaxation (grade 1 diastolic dysfunction). - Aortic valve: Valve mobility was restricted. Transvalvular   velocity was within the normal range. There was no stenosis.   There was moderate regurgitation. - Mitral valve: Calcified annulus. Transvalvular velocity was   within the normal range. There was no evidence for stenosis.   There was no regurgitation. - Right ventricle: The cavity size was normal. Wall thickness was   normal. Systolic function was normal. - Tricuspid valve: There was mild regurgitation. - Pulmonary arteries: Systolic pressure was within the normal   range. PA peak pressure: 28 mm Hg (S).  Laboratory Data:  Chemistry Recent Labs  Lab 06/08/17 1201  NA 140  K 5.1  CL 105  CO2 24  GLUCOSE 110*  BUN 30*  CREATININE 1.26*  CALCIUM 9.4  GFRNONAA 51*  GFRAA 60*  ANIONGAP 11    No results for input(s): PROT, ALBUMIN, AST, ALT, ALKPHOS, BILITOT in the last 168 hours. Hematology Recent Labs  Lab 06/08/17 0927 06/08/17 1201  WBC 7.8 8.3  RBC 4.14* 4.23  HGB 14.1 14.0  HCT 41.1 42.4  MCV 99.3 100.2*  MCH  --  33.1  MCHC 34.2 33.0  RDW 13.2 13.7  PLT 183.0 189   Cardiac EnzymesNo results for input(s): TROPONINI in the last 168 hours. No results for input(s): TROPIPOC in the last 168 hours.  BNP Recent Labs  Lab 06/08/17 0927  PROBNP  265.0*    DDimer No results for input(s): DDIMER in the last 168 hours.  Radiology/Studies:  Dg Chest Port 1 View  Result Date: 06/08/2017 CLINICAL DATA:  Shortness of breath and chest pain EXAM: PORTABLE CHEST 1 VIEW COMPARISON:  chest radiograph 02/16/2016 FINDINGS: Heart size is unchanged. Left chest wall pacemaker leads are unchanged. No focal airspace consolidation. No pleural effusion or pneumothorax. Mild interstitial prominence without  overt pulmonary edema. IMPRESSION: No focal airspace disease. Electronically Signed   By: Ulyses Jarred M.D.   On: 06/08/2017 14:17    Assessment and Plan:    1. Atrial Fibrillation w/ RVR: known h/o atrial fibrillation and h/o atrial flutter s/p aflutter ablation in 2015. On chronic anticoagulation with Eliquis. TSH pending. K WNL. H/H stable. Mild renal insufficieny noted SCr/BUN 1.26/30 (baseline SCr ~1). Will order light hydration. Update echo once rate is better controlled. Continue IV Cardizem for now along with home metoprolol dose for rate control. Unable to cardiovert w/o TEE given pt has missed a dose of Eliquis within the last 3 weeks. If able to adequately control HR, can consider outpatient DCCV after 3 weeks of uninterrupted anticoagulation.   2. Chest Pain: may be secondary to afib w/ RVR. However he has known coronary disease, nonobstructive by recent cath 03/2017 and had a recent episode of SSCP relieved with SL NTG.  We will cycle troponin's to ensure no plaque rupture/ACS. Update 2D echo to assess of interval change in EF/ wall motion since previous study in 2017.   3. Abnormal Troponin: troponin checked at PCP office @0927  was 0.08, however f/u POC troponin in the ED is negative. We will continue enzyme observation. Cycle Troponin I x 3.    4. CAD: recent [facility] 03/2017 showed nonobstructive CAD% prox LCx, 50% mid LAD, 20% mid RCA and 30% distal RCA. Currently CP free but had an episode of SSCP last week, relieved with SL NTG. Now back in atrial fibrillation w/ RVR. Check echo and cycle troponins.    5. PPM: STJ dual Chamber, implanted 2017 for SSS. Followed by Dr. Caryl Comes   6. Renal Insuffiencey: SCr/BUN 1.26/30. Baseline SCr ~1.0. Light hydration. Monitor. F/u BMP in the AM.    Severity of Illness: The appropriate patient status for this patient is OBSERVATION. Observation status is judged to be reasonable and necessary in order to provide the required intensity of service to  ensure the patient's safety. The patient's presenting symptoms, physical exam findings, and initial radiographic and laboratory data in the context of their medical condition is felt to place them at decreased risk for further clinical deterioration. Furthermore, it is anticipated that the patient will be medically stable for discharge from the hospital within 2 midnights of admission. The following factors support the patient status of observation.   " The patient's presenting symptoms include exertional dyspnea. " The physical exam findings include irregular rhythm, tachy rate. " The initial radiographic and laboratory data are atrial fibrillation w/ RVR.     For questions or updates, please contact [facility] Please consult [email or web address] for contact info under Cardiology/STEMI.    Signed, Lyda Jester, PA-C  06/08/2017 2:26 PM

## 2017-06-08 NOTE — ED Provider Notes (Signed)
[place] EMERGENCY DEPARTMENT Provider Note   CSN: [id number] Arrival date & time: 06/08/17  1150     History   Chief Complaint Chief Complaint  Patient presents with  . Tachycardia    HPI Donald [name] is a 84 y.o. male with history of CAD, atrial flutter, cardiomyopathy, diverticulosis, GERD, HLD, HTN, and OSA presents today for evaluation of acutely elevated troponin.  He was at his primary care office earlier today for a shingles vaccination and mentioned that he had chest pain last week.  EKG performed in the office shows A. fib with rapid ventricular response and he had a mildly elevated troponin in the office and was advised to come to the emergency department for further evaluation.  He is on Eliquis and states that he has missed only 1 dose last week in the recent past.  He has also been taking the rest of his home medications as prescribed.  He describes one episode of mild substernal chest pressure 1 week ago but states that this resolved and has not returned.  He endorses dyspnea on exertion but states this is chronic and unchanged for him.  He denies fevers, chills, cough, abdominal pain, nausea, vomiting, diaphoresis, or syncope.  No prior history of DVT or PE.  He denies any new leg swelling and has been taking his Lasix as prescribed.  The history is provided by the patient.    Past Medical History:  Diagnosis Date  . Abnormal CT scan, stomach 02/2014   Thickening of gastric fundus and cardia.  gastritis on EGD 03/2014  . Allergic rhinitis due to pollen   . Arthropathy, unspecified, site unspecified   . Atherosclerosis    a. Noted by abdominal CT 02/2014 (h/o normal nuc 2011).  . Atrial flutter ([place]) 02/24/14   s/p ablation 11/15  . Cardiomyopathy ([place])    tachy mediated - a. TEE (10/15):  EF 30%;  b. Echo after NSR restored (10/15):  mild LVH, EF 55-60%, mild AS, mild AI, mild MR, mild to mod LAE, mild RAE  . Diverticulosis    severe in  descending, sigmoid colon.   . ED (erectile dysfunction)   . Esophageal stricture 03/2014   traversable with endoscope. not dilated.   . Gastric AVM   . GERD (gastroesophageal reflux disease) 03/2014   small HH and gastritis on EGD  . GI bleed   . Habitual alcohol use   . Hemorrhoids   . Hiatal hernia   . Hyperlipidemia   . Hypertension   . Obesity   . OSA on CPAP   . PAF (paroxysmal atrial fibrillation) ([place])   . Prostatitis, unspecified   . 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 Skin cancer    basal and squamous cell    Patient Active Problem List   Diagnosis Date Noted  . Coronary artery disease involving native coronary artery of native heart without angina pectoris 06/08/2017  . Tachycardia 06/08/2017  . Renal cyst, left 03/08/2016  . Deficiency anemia 03/07/2016  . Iron deficiency anemia due to chronic blood loss 04/29/2015  . Anticoagulant long-term use   . History of GI bleed 04/21/2015  . Hearing loss 01/20/2015  . Esophageal stricture 04/09/2014  . Gastritis and gastroduodenitis 04/09/2014  . PAF (paroxysmal atrial fibrillation) ([place]) 04/08/2014  . Atrial flutter ([place]) 03/19/2014  . Habitual alcohol use   . Obesity (BMI 30.0-34.9)   . Chest pain 02/03/2014  . OSA (obstructive sleep  apnea) 02/03/2014  . Hyperlipidemia with target LDL less than 100 05/07/2013  . Routine health maintenance 04/19/2011  . Essential hypertension 03/10/2008  . ERECTILE DYSFUNCTION 03/20/2007  . ATHEROSCLEROSIS, AORTIC 01/24/2007  . Allergic rhinitis due to pollen 01/24/2007    Past Surgical History:  Procedure Laterality Date  . ATRIAL FLUTTER ABLATION N/A 03/19/2014   RFCA of atrial flutter by Dr Caryl Comes  . CARDIOVERSION N/A 02/24/2014   Procedure: CARDIOVERSION;  Surgeon: Lelon Perla, MD;  Location: [place];  Service: Cardiovascular;  Laterality: N/A;  . CARDIOVERSION Right 09/10/2014   Procedure: CARDIOVERSION;  Surgeon: Deboraha Sprang, MD;  Location:  [facility] CATH LAB;  Service: Cardiovascular;  Laterality: Right;  . COLONOSCOPY N/A 04/23/2015   Procedure: COLONOSCOPY;  Surgeon: Ladene Artist, MD;  Location: [facility] ENDOSCOPY;  Service: Endoscopy;  Laterality: N/A;  . ENTEROSCOPY N/A 06/12/2015   Procedure: ENTEROSCOPY;  Surgeon: Ladene Artist, MD;  Location: [facility] ENDOSCOPY;  Service: Endoscopy;  Laterality: N/A;  . EP IMPLANTABLE DEVICE N/A 02/15/2016   Procedure: Pacemaker Implant;  Surgeon: Evans Lance, MD;  Location: [place] CV LAB;  Service: Cardiovascular;  Laterality: N/A;  . ESOPHAGOGASTRODUODENOSCOPY N/A 04/09/2014   Procedure: ESOPHAGOGASTRODUODENOSCOPY (EGD);  Surgeon: Inda Castle, MD;  Location: [place];  Service: Endoscopy;  Laterality: N/A;  . ESOPHAGOGASTRODUODENOSCOPY N/A 04/23/2015   Procedure: ESOPHAGOGASTRODUODENOSCOPY (EGD);  Surgeon: Ladene Artist, MD;  Location: [facility] ENDOSCOPY;  Service: Endoscopy;  Laterality: N/A;  . INGUINAL HERNIA REPAIR     right  . INGUINAL HERNIA REPAIR     left  . LEFT HEART CATH AND CORONARY ANGIOGRAPHY N/A 03/16/2017   Procedure: LEFT HEART CATH AND CORONARY ANGIOGRAPHY;  Surgeon: Burnell Blanks, MD;  Location: [place] CV LAB;  Service: Cardiovascular;  Laterality: N/A;  . ORIF fracture of the elbow    . SKIN CANCER EXCISION  05/21/12   Squamous cell ca  . TEE WITHOUT CARDIOVERSION N/A 02/24/2014   Procedure: TRANSESOPHAGEAL ECHOCARDIOGRAM (TEE);  Surgeon: Lelon Perla, MD;  Location: [facility] ENDOSCOPY;  Service: Cardiovascular;  Laterality: N/A;  . TONSILLECTOMY         Home Medications    Prior to Admission medications   Medication Sig Start Date End Date Taking? Authorizing Provider  atorvastatin (LIPITOR) 40 MG tablet TAKE 1 TABLET ONCE DAILY. Patient taking differently: TAKE 1 TABLET (40mg ) ONCE DAILY. 05/06/17  Yes Janith Lima, MD  ELIQUIS 5 MG TABS tablet TAKE 1 TABLET BY MOUTH TWICE DAILY. Patient taking differently: TAKE 1 TABLET (5mg ) BY MOUTH TWICE  DAILY. 05/17/17  Yes Deboraha Sprang, MD  ferrous sulfate 325 (65 FE) MG tablet Take 1 tablet (325 mg total) by mouth daily with breakfast. 04/25/15  Yes Reyne Dumas, MD  fluticasone (FLONASE) 50 MCG/ACT nasal spray 1 spray in each nostril daily as needed for stuffiness Patient taking differently: Place 1 spray into both nostrils daily as needed for allergies.  03/09/16  Yes Janith Lima, MD  furosemide (LASIX) 40 MG tablet TAKE 1 TABLET EACH DAY. Patient taking differently: TAKE 1 TABLET (40mg )  EACH DAY. 04/20/17  Yes Janith Lima, MD  isosorbide mononitrate (IMDUR) 60 MG 24 hr tablet TAKE 1 TABLET ONCE DAILY. Patient taking differently: TAKE 1 TABLET (60mg ) ONCE DAILY. 05/29/17  Yes Deboraha Sprang, MD  loratadine (CLARITIN) 10 MG tablet Take 10 mg by mouth daily as needed for allergies.    Yes [provider]  MAGNESIUM PO Take 6 tablets by  mouth at bedtime.    Yes [provider]  metoprolol tartrate (LOPRESSOR) 50 MG tablet TAKE 1 TABLET BY MOUTH TWICE DAILY. Patient taking differently: TAKE 1 TABLET (50 mg) BY MOUTH TWICE DAILY. 03/28/17  Yes Deboraha Sprang, MD  Multiple Vitamins-Minerals (MULTIVITAMIN PO) Take 1 tablet by mouth 2 (two) times daily.   Yes [provider]  nitroGLYCERIN (NITROSTAT) 0.4 MG SL tablet Place 1 tablet (0.4 mg total) under the tongue every 5 (five) minutes as needed for chest pain. 05/23/16  Yes Deboraha Sprang, MD  pantoprazole (PROTONIX) 40 MG tablet TAKE 1 TABLET EACH DAY. Patient taking differently: Take 40 mg by mouth daily.  12/08/16  Yes Pyrtle, Lajuan Lines, MD  losartan (COZAAR) 50 MG tablet Take 1 tablet (50 mg total) by mouth daily. 05/23/16 03/10/17  Deboraha Sprang, MD  Zoster Vaccine Adjuvanted [facility]) injection Inject 0.5 mLs into the muscle once for 1 dose. 06/08/17 06/08/17  Janith Lima, MD    Family History Family History  Problem Relation Age of Onset  . Cancer Mother        ovarian  . Other Father        renal  disease- grief over loss of spouse  . Cancer Brother        prostate- died of hematologic disorder 2nd to chemo  . Pulmonary fibrosis Brother   . Diabetes Neg Hx   . Coronary artery disease Neg Hx   . Colon cancer Neg Hx   . Stomach cancer Neg Hx   . Rectal cancer Neg Hx     Social History Social History   Tobacco Use  . Smoking status: Former Smoker    Packs/day: 0.00    Years: 40.00    Pack years: 0.00    Types: Cigarettes, Cigars    Last attempt to quit: 09/18/2008    Years since quitting: 8.7  . Smokeless tobacco: Former Systems developer    Types: Chew  . Tobacco comment: Has Occasional Cigar/ quit   Substance Use Topics  . Alcohol use: Yes    Alcohol/week: 16.8 oz    Types: 21 Glasses of wine, 7 Shots of liquor per week  . Drug use: No     Allergies   Patient has no known allergies.   Review of Systems Review of Systems  Constitutional: Negative for chills, diaphoresis and fever.  Respiratory: Positive for shortness of breath.   Cardiovascular: Positive for chest pain and leg swelling.  Gastrointestinal: Negative for abdominal pain, nausea and vomiting.  All other systems reviewed and are negative.    Physical Exam Updated Vital Signs BP 100/84   Pulse (!) 122   Temp 97.6 F (36.4 C) (Oral)   Resp (!) 22   Ht 5\' 9"  (1.753 m)   Wt 102.5 kg (226 lb)   SpO2 95%   BMI 33.37 kg/m   Physical Exam  Constitutional: He is oriented to person, place, and time. He appears well-developed and well-nourished. No distress.  HENT:  Head: Normocephalic and atraumatic.  Eyes: Conjunctivae are normal. Right eye exhibits no discharge. Left eye exhibits no discharge.  Neck: Normal range of motion. Neck supple. No JVD present. No tracheal deviation present.  Cardiovascular:  Irregularly irregular rhythm, 1+ nonpitting edema of the bilateral lower extremities, 2+ radial and DP/PT pulses bl, negative Homan's bl   Pulmonary/Chest: Effort normal and breath sounds normal. No stridor.  No respiratory distress. He has no wheezes. He has no rales. He exhibits no  tenderness.  Abdominal: Soft. Bowel sounds are normal. He exhibits no distension. There is no tenderness. There is no guarding.  Musculoskeletal: He exhibits no edema.  Neurological: He is alert and oriented to person, place, and time. No sensory deficit. He exhibits normal muscle tone.  Skin: Skin is warm and dry. No erythema.  Psychiatric: He has a normal mood and affect. His behavior is normal.  Nursing note and vitals reviewed.    ED Treatments / Results  Labs (all labs ordered are listed, but only abnormal results are displayed) Labs Reviewed  BASIC METABOLIC PANEL - Abnormal; Notable for the following components:      Result Value   Glucose, Bld 110 (*)    BUN 30 (*)    Creatinine, Ser 1.26 (*)    GFR calc non Af Amer 51 (*)    GFR calc Af Amer 60 (*)    All other components within normal limits  CBC - Abnormal; Notable for the following components:   MCV 100.2 (*)    All other components within normal limits  PROTIME-INR - Abnormal; Notable for the following components:   Prothrombin Time 18.2 (*)    All other components within normal limits  I-STAT TROPONIN, ED    EKG  EKG Interpretation  Date/Time:  Thursday June 08 2017 12:05:05 EST Ventricular Rate:  141 PR Interval:    QRS Duration: 90 QT Interval:  310 QTC Calculation: 474 R Axis:   11 Text Interpretation:  Atrial fibrillation with rapid ventricular response Nonspecific ST abnormality Abnormal ECG previous EKG showed afib  Confirmed by Wandra Arthurs [phone]) on 06/08/2017 1:49:01 PM       Radiology Dg Chest Port 1 View  Result Date: 06/08/2017 CLINICAL DATA:  Shortness of breath and chest pain EXAM: PORTABLE CHEST 1 VIEW COMPARISON:  chest radiograph 02/16/2016 FINDINGS: Heart size is unchanged. Left chest wall pacemaker leads are unchanged. No focal airspace consolidation. No pleural effusion or pneumothorax. Mild interstitial  prominence without overt pulmonary edema. IMPRESSION: No focal airspace disease. Electronically Signed   By: Ulyses Jarred M.D.   On: 06/08/2017 14:17    Procedures Procedures (including critical care time)  Medications Ordered in ED Medications  diltiazem (CARDIZEM) injection 10 mg (10 mg Intravenous Bolus 06/08/17 1515)  diltiazem (CARDIZEM) 100 mg in dextrose 5% 165mL (1 mg/mL) infusion (5 mg/hr Intravenous New Bag/Given 06/08/17 1514)     Initial Impression / Assessment and Plan / ED Course  I have reviewed the triage vital signs and the nursing notes.  Pertinent labs & imaging results that were available during my care of the patient were reviewed by me and considered in my medical decision making (see chart for details).     Patient presents sent over by his primary care physician for evaluation of abnormal EKG and elevated troponin.  He had a mildly elevated troponin of 0.08 in the primary care office earlier today, repeat troponin is normal in the ED.  EKG shows A. fib with RVR.  Chest x-ray shows no focal airspace disease, is mild lower extremity edema and mildly elevated BNP but does not appear to be in the midst of CHF exacerbation. Diltiazem bolus and drip were initiated. Doubt PE. Remainder of labwork reassuring.  Awaiting cardiology recommendations.  4:10 PM Spoke with Ellen Henri, cardiology PA. Plan will be to admit for observation under cardiology service. Will increase diltiazem drip at their request.    CRITICAL CARE Performed by: Renita Papa   Total  critical care time: 35 minutes  Critical care time was exclusive of separately billable procedures and treating other patients.  Critical care was necessary to treat or prevent imminent or life-threatening deterioration.  Critical care was time spent personally by me on the following activities: development of treatment plan with patient and/or surrogate as well as nursing, discussions with consultants,  evaluation of patient's response to treatment, examination of patient, obtaining history from patient or surrogate, ordering and performing treatments and interventions, ordering and review of laboratory studies, ordering and review of radiographic studies, pulse oximetry and re-evaluation of patient's condition.  Final Clinical Impressions(s) / ED Diagnoses   Final diagnoses:  Atrial fibrillation with rapid ventricular response [facility])  Elevated troponin    ED Discharge Orders    None       Renita Papa, PA-C 06/08/17 1611    Drenda Freeze, MD 06/09/17 [phone]

## 2017-06-08 NOTE — Progress Notes (Signed)
Patient converted to NSR ; EKG done and shows A paced. Current HR 64. Cardizem drip titrated down to 5 mg/hr.  MD on call Dr Radford Pax text paged. Waiting for response.

## 2017-06-09 ENCOUNTER — Inpatient Hospital Stay (HOSPITAL_COMMUNITY): Payer: PPO

## 2017-06-09 DIAGNOSIS — R748 Abnormal levels of other serum enzymes: Secondary | ICD-10-CM | POA: Diagnosis not present

## 2017-06-09 DIAGNOSIS — I361 Nonrheumatic tricuspid (valve) insufficiency: Secondary | ICD-10-CM

## 2017-06-09 DIAGNOSIS — I4891 Unspecified atrial fibrillation: Secondary | ICD-10-CM | POA: Diagnosis not present

## 2017-06-09 LAB — ECHOCARDIOGRAM COMPLETE
HEIGHTINCHES: 68 in
WEIGHTICAEL: 3462.4 [oz_av]

## 2017-06-09 LAB — THYROID PANEL WITH TSH
FREE THYROXINE INDEX: 2.2 (ref 1.4–3.8)
T3 UPTAKE: 37 % — AB (ref 22–35)
T4 TOTAL: 5.9 ug/dL (ref 4.9–10.5)
TSH: 0.46 m[IU]/L (ref 0.40–4.50)

## 2017-06-09 LAB — BASIC METABOLIC PANEL
ANION GAP: 11 (ref 5–15)
BUN: 28 mg/dL — ABNORMAL HIGH (ref 6–20)
CHLORIDE: 105 mmol/L (ref 101–111)
CO2: 24 mmol/L (ref 22–32)
Calcium: 9.1 mg/dL (ref 8.9–10.3)
Creatinine, Ser: 1.31 mg/dL — ABNORMAL HIGH (ref 0.61–1.24)
GFR calc non Af Amer: 49 mL/min — ABNORMAL LOW (ref >60)
GFR, EST AFRICAN AMERICAN: 57 mL/min — AB (ref >60)
Glucose, Bld: 108 mg/dL — ABNORMAL HIGH (ref 65–99)
POTASSIUM: 5.1 mmol/L (ref 3.5–5.1)
Sodium: 140 mmol/L (ref 135–145)

## 2017-06-09 LAB — TROPONIN I
Troponin I: 0.07 ng/mL (ref <0.03)
Troponin I: 0.07 ng/mL (ref <0.03)

## 2017-06-09 MED ORDER — DILTIAZEM HCL ER COATED BEADS 120 MG PO CP24: 120.0000 mg | ORAL_CAPSULE | Freq: Every day | ORAL | 3 refills | Status: DC

## 2017-06-09 MED ORDER — DILTIAZEM HCL ER COATED BEADS 120 MG PO CP24
120.0000 mg | ORAL_CAPSULE | Freq: Every day | ORAL | Status: DC
  Administered 2017-06-09: 120 mg via ORAL
  Filled 2017-06-09: qty 1

## 2017-06-09 MED ORDER — ISOSORBIDE MONONITRATE ER 30 MG PO TB24: 30.0000 mg | ORAL_TABLET | Freq: Every day | ORAL | 3 refills | Status: DC

## 2017-06-09 MED ORDER — FUROSEMIDE 40 MG PO TABS: 20.0000 mg | ORAL_TABLET | Freq: Every day | ORAL | 2 refills | Status: DC

## 2017-06-09 NOTE — Care Management CC44 (Signed)
Condition Code 44 Documentation Completed  Patient Details  Name: Donald [name] MRN: [id number] Date of Birth: July 21, 1932   Condition Code 44 given:  Yes Patient signature on Condition Code 44 notice:  Yes Documentation of 2 MD's agreement:  Yes Code 44 added to claim:  Yes    Carles Collet, RN 06/09/2017, 4:53 PM

## 2017-06-09 NOTE — Discharge Summary (Signed)
Discharge Summary    Patient ID: Donald [name],  MRN: [id number], DOB/AGE: [birth year] 84 y.o.  Admit date: 06/08/2017 Discharge date: 06/09/2017  Primary Care Provider: Janith Lima Primary Cardiologist: Virl Axe, MD  Discharge Diagnoses    Active Problems:   Elevated troponin   Atrial fibrillation with RVR [facility])   Allergies No Known Allergies  Diagnostic Studies/Procedures    Echocardiogram 06/09/2017 Study Conclusions  - Left ventricle: The cavity size was normal. Systolic function was   normal. The estimated ejection fraction was in the range of 55%   to 60%. Wall motion was normal; there were no regional wall   motion abnormalities. There was a reduced contribution of atrial   contraction to ventricular filling, due to atrial contractile   dysfunction (e.g. stunning after recent atrial fibrillation)..   Doppler parameters are consistent with elevated mean left atrial   filling pressure. - Aortic valve: Right coronary cusp mobility was mildly restricted.   There was mild to moderate regurgitation directed centrally in   the LVOT. - Mitral valve: There was mild to moderate regurgitation directed   centrally. - Left atrium: The atrium was moderately dilated. - Right ventricle: The cavity size was mildly dilated. Wall   thickness was normal. - Right atrium: The atrium was moderately dilated. - Pulmonary arteries: PA peak pressure: 31 mm Hg (S). _____________   History of Present Illness     Donald [name] is a 84 y.o. male with a history of atrial flutter s/p aflutter ablation in 2015, atrial fibrillation, SSS s/p PPM, nonobstructive CAD by recent cath, chronic diastolic HF, HTN, HLD and ETOH abuse, sent to ED from PCP office for management of atrial fibrillation w/ RVR.  He has a h/o paroxsymal atrial flutter with associated tachycardia mediated cardiomyopathy. He has been on chronic anticoagulation with Eliquis. He underwent RFCA (flutter ablation) by  Dr. Caryl Comes 03/2014 and his LV function improved once in normal sinus rthythm. His most recent 2D echo 02/2016  showed normal EF at 60-65%. He has also had issues with atrial fibrillation. In 2017, he underwent PPM implant for SSS/ symptomatic bradycardia. In Nov 2018, he had a LHC that showed moderate non-obstructive CAD with normal LV systolic function and normal LV filling pressures. Medical management recommended. Also with h/o functionally bicuspid aortic valve (no stenosis noted on last echo), diastolic HF, HTN, HLD, ETOH abuse and skin CA.  Pt presented to his PCP office, Dr. Ronnald Ramp, today mainly for shingles vaccine. Routine vital signs checked and he was noted to be tachycardiac with an irregular pulse, leading to EKG which confirmed atrial fibrillation w/ RVR. Upon further questioning, he noted an episode of substernal chest pain that occurred about 1 week ago. Occurred at rest. Heaviness in chest. Lasted ~2 hrs and resolved with SL NTG. He denies any recurrence since that day last week. He also notes exertional dyspnea with moderate activities such as walking up steep hills/inclines. He denies dyspnea with basic ADLS. Dr. Ronnald Ramp checked labs. Office troponin was mildly elevated at 0.08. BNP was 265. Pt sent to ED.     Hospital Course     Consultants: none  IV cardizem started and patient converted to sinus rhythm. SCr was mildly elevated at 1.31. ARB is on hold.  He maintained SR over night and was transitioned to oral cardizem this am. He has continued to maintain sinus rhythm today. He feels well. His BP is a little soft. He is asymptomatic. Will continue to  hold losartan and reduce lasix and Imdur. Reviewed symptoms of hypotension with pt. He is very cognizant and verbalizes understanding of safety precautions and when to call the office.   Will arrange for close follow up with Dr. Caryl Comes and repeat labs at office visit to follow up on renal function.   Patient has been seen by Dr. Oval Linsey  today and deemed ready for discharge home. All follow up appointments have been scheduled. Discharge medications are listed below. _____________  Discharge Vitals Blood pressure 94/60, pulse 60, temperature 97.6 F (36.4 C), temperature source Oral, resp. rate 16, height 5\' 8"  (1.727 m), weight 216 lb 6.4 oz (98.2 kg), SpO2 96 %.  Filed Weights   06/08/17 1202 06/08/17 2018 06/09/17 0612  Weight: 226 lb (102.5 kg) 214 lb 8 oz (97.3 kg) 216 lb 6.4 oz (98.2 kg)    Labs & Radiologic Studies    CBC Recent Labs    06/08/17 0927 06/08/17 1201  WBC 7.8 8.3  NEUTROABS 5.1  --   HGB 14.1 14.0  HCT 41.1 42.4  MCV 99.3 100.2*  PLT 183.0 500   Basic Metabolic Panel Recent Labs    06/08/17 1201 06/09/17 0803  NA 140 140  K 5.1 5.1  CL 105 105  CO2 24 24  GLUCOSE 110* 108*  BUN 30* 28*  CREATININE 1.26* 1.31*  CALCIUM 9.4 9.1   Liver Function Tests No results for input(s): AST, ALT, ALKPHOS, BILITOT, PROT, ALBUMIN in the last 72 hours. No results for input(s): LIPASE, AMYLASE in the last 72 hours. Cardiac Enzymes Recent Labs    06/08/17 0927 06/08/17 2152 06/09/17 0210 06/09/17 0803  CKTOTAL 54  --   --   --   CKMB 2.2  --   --   --   TROPONINI  --  0.08* 0.07* 0.07*   BNP Invalid input(s): POCBNP D-Dimer No results for input(s): DDIMER in the last 72 hours. Hemoglobin A1C No results for input(s): HGBA1C in the last 72 hours. Fasting Lipid Panel Recent Labs    06/08/17 0927  CHOL 107  HDL 40.80  LDLCALC 49  TRIG 85.0  CHOLHDL 3   Thyroid Function Tests Recent Labs    06/08/17 0927  TSH 0.46  T4TOTAL 5.9   _____________  Dg Chest Port 1 View  Result Date: 06/08/2017 CLINICAL DATA:  Shortness of breath and chest pain EXAM: PORTABLE CHEST 1 VIEW COMPARISON:  chest radiograph 02/16/2016 FINDINGS: Heart size is unchanged. Left chest wall pacemaker leads are unchanged. No focal airspace consolidation. No pleural effusion or pneumothorax. Mild interstitial  prominence without overt pulmonary edema. IMPRESSION: No focal airspace disease. Electronically Signed   By: Ulyses Jarred M.D.   On: 06/08/2017 14:17   Disposition   Pt is being discharged home today in good condition.  Follow-up Plans & Appointments    Follow-up Information    Deboraha Sprang, MD Follow up.   Specialty:  Cardiology Why:  Follow up with Dr. Caryl Comes on 06/20/2017 at 1:15. Labs to be checked at appointment.  Contact information: [address] [place] [address] [phone]          Discharge Instructions    Diet - low sodium heart healthy   Complete by:  As directed    Increase activity slowly   Complete by:  As directed       Discharge Medications   Allergies as of 06/09/2017   No Known Allergies     Medication List  STOP taking these medications   losartan 50 MG tablet Commonly known as:  COZAAR   Zoster Vaccine Adjuvanted injection Commonly known as:  SHINGRIX     TAKE these medications   atorvastatin 40 MG tablet Commonly known as:  LIPITOR TAKE 1 TABLET ONCE DAILY. What changed:    how much to take  how to take this  when to take this   diltiazem 120 MG 24 hr capsule Commonly known as:  CARDIZEM CD Take 1 capsule (120 mg total) by mouth daily. Start taking on:  06/10/2017   ELIQUIS 5 MG Tabs tablet Generic drug:  apixaban TAKE 1 TABLET BY MOUTH TWICE DAILY. What changed:    how much to take  how to take this  when to take this   ferrous sulfate 325 (65 FE) MG tablet Take 1 tablet (325 mg total) by mouth daily with breakfast.   fluticasone 50 MCG/ACT nasal spray Commonly known as:  FLONASE 1 spray in each nostril daily as needed for stuffiness What changed:    how much to take  how to take this  when to take this  reasons to take this  additional instructions   furosemide 40 MG tablet Commonly known as:  LASIX Take 0.5 tablets (20 mg total) by mouth daily. What changed:  See the new  instructions.   isosorbide mononitrate 30 MG 24 hr tablet Commonly known as:  IMDUR Take 1 tablet (30 mg total) by mouth daily. What changed:    medication strength  how much to take   loratadine 10 MG tablet Commonly known as:  CLARITIN Take 10 mg by mouth daily as needed for allergies.   MAGNESIUM PO Take 6 tablets by mouth at bedtime.   metoprolol tartrate 50 MG tablet Commonly known as:  LOPRESSOR TAKE 1 TABLET BY MOUTH TWICE DAILY. What changed:    how much to take  how to take this  when to take this   MULTIVITAMIN PO Take 1 tablet by mouth 2 (two) times daily.   nitroGLYCERIN 0.4 MG SL tablet Commonly known as:  NITROSTAT Place 1 tablet (0.4 mg total) under the tongue every 5 (five) minutes as needed for chest pain.   pantoprazole 40 MG tablet Commonly known as:  PROTONIX TAKE 1 TABLET EACH DAY. What changed:    how much to take  how to take this  when to take this  additional instructions         Outstanding Labs/Studies   BMet for renal function at follow up.   Duration of Discharge Encounter   Greater than 30 minutes including physician time.  Signed, Daune Perch NP 06/09/2017, 5:29 PM

## 2017-06-09 NOTE — Care Management Obs Status (Signed)
[place] NOTIFICATION   Patient Details  Name: Donald [name] MRN: [id number] Date of Birth: [birth year]   Medicare Observation Status Notification Given:  Yes    Carles Collet, RN 06/09/2017, 4:53 PM

## 2017-06-09 NOTE — Progress Notes (Signed)
Troponin 0.08. MD on call Dr Radford Pax made aware. No new orders at this time.  Will continue to monitor patient.

## 2017-06-09 NOTE — Progress Notes (Signed)
   06/09/17 1600  Clinical Encounter Type  Visited With Patient  Visit Type Initial  Referral From Chaplain  Consult/Referral To Chaplain  Spiritual Encounters  Spiritual Needs Emotional  Stress Factors  Patient Stress Factors Exhausted  Family Stress Factors None identified    Patient was standing getting water from sink. No family member but patient talked of good family dynamics. Family had visited and left. Pt looking forward to discharge.  Donald [name] a Medical sales representative, [employer]

## 2017-06-09 NOTE — Progress Notes (Signed)
Progress Note  Patient Name: Donald [name] Date of Encounter: 06/09/2017  Primary Cardiologist: Virl Axe, MD   Subjective   Pt is feeling well. No chest discomfort or shortness of breath. No palpitations, he never had any.   Inpatient Medications    Scheduled Meds: . apixaban  5 mg Oral BID  . atorvastatin  40 mg Oral Daily  . ferrous sulfate  325 mg Oral Q breakfast  . isosorbide mononitrate  60 mg Oral Daily  . magnesium oxide   Oral QHS  . metoprolol tartrate  50 mg Oral BID  . pantoprazole  40 mg Oral Daily   Continuous Infusions:  PRN Meds: acetaminophen, nitroGLYCERIN   Vital Signs    Vitals:   06/09/17 0219 06/09/17 0236 06/09/17 0612 06/09/17 0700  BP: 102/67 106/72 103/67 114/62  Pulse: 60 60 61 62  Resp: (!) 23 13 16    Temp:   98.1 F (36.7 C) 97.6 F (36.4 C)  TempSrc:   Oral Oral  SpO2: 96% 98% 98% 97%  Weight:   216 lb 6.4 oz (98.2 kg)   Height:        Intake/Output Summary (Last 24 hours) at 06/09/2017 0837 Last data filed at 06/09/2017 9518 Gross per 24 hour  Intake 103.5 ml  Output 475 ml  Net -371.5 ml   Filed Weights   06/08/17 1202 06/08/17 2018 06/09/17 0612  Weight: 226 lb (102.5 kg) 214 lb 8 oz (97.3 kg) 216 lb 6.4 oz (98.2 kg)    Telemetry    Converted to sinus rhythm and maintaining through the night in the low 60's.  - Personally Reviewed  ECG    At 2142 on 06/08/17: atrial pacing, sinus at 60 bpm with non-specific ST changes in V1-2 - Personally Reviewed  Physical Exam   GEN: No acute distress.   Neck: No JVD Cardiac: RRR, no murmurs, rubs, or gallops.  Respiratory: Clear to auscultation bilaterally. GI: Soft, nontender, non-distended  MS: No edema; No deformity. Neuro:  Nonfocal  Psych: Normal affect   Labs    Chemistry Recent Labs  Lab 06/08/17 1201  NA 140  K 5.1  CL 105  CO2 24  GLUCOSE 110*  BUN 30*  CREATININE 1.26*  CALCIUM 9.4  GFRNONAA 51*  GFRAA 60*  ANIONGAP 11     Hematology Recent  Labs  Lab 06/08/17 0927 06/08/17 1201  WBC 7.8 8.3  RBC 4.14* 4.23  HGB 14.1 14.0  HCT 41.1 42.4  MCV 99.3 100.2*  MCH  --  33.1  MCHC 34.2 33.0  RDW 13.2 13.7  PLT 183.0 189    Cardiac Enzymes Recent Labs  Lab 06/08/17 2152 06/09/17 0210  TROPONINI 0.08* 0.07*    Recent Labs  Lab 06/08/17 1419  TROPIPOC 0.00     BNP Recent Labs  Lab 06/08/17 0927  PROBNP 265.0*     DDimer No results for input(s): DDIMER in the last 168 hours.   Radiology    Dg Chest Port 1 View  Result Date: 06/08/2017 CLINICAL DATA:  Shortness of breath and chest pain EXAM: PORTABLE CHEST 1 VIEW COMPARISON:  chest radiograph 02/16/2016 FINDINGS: Heart size is unchanged. Left chest wall pacemaker leads are unchanged. No focal airspace consolidation. No pleural effusion or pneumothorax. Mild interstitial prominence without overt pulmonary edema. IMPRESSION: No focal airspace disease. Electronically Signed   By: Ulyses Jarred M.D.   On: 06/08/2017 14:17    Cardiac Studies   LEFT HEART CATH AND CORONARY  ANGIOGRAPHY 03/2017  Conclusion     Prox Cx lesion, 50 %stenosed.  Mid LAD lesion, 50 %stenosed.  Mid RCA lesion, 20 %stenosed.  Dist RCA lesion, 30 %stenosed.  The left ventricular systolic function is normal.  LV end diastolic pressure is normal.  The left ventricular ejection fraction is 55-65% by visual estimate.  There is no mitral valve regurgitation.  1. Moderate non-obstructive CAD 2. Normal LV systolic function 3. Normal LV filling pressures  Recommendations: Medical management of CAD. Aggressive risk factor reduction.    Limited 2D Echo 02/2016 Study Conclusions  - Left ventricle: The cavity size was normal. There was mild concentric hypertrophy. Systolic function was normal. The estimated ejection fraction was in the range of 60% to 65%. Wall motion was normal; there were no regional wall motion abnormalities. Doppler parameters are consistent with  abnormal left ventricular relaxation (grade 1 diastolic dysfunction). - Aortic valve: Valve mobility was restricted. Transvalvular velocity was within the normal range. There was no stenosis. There was moderate regurgitation. - Mitral valve: Calcified annulus. Transvalvular velocity was within the normal range. There was no evidence for stenosis. There was no regurgitation. - Right ventricle: The cavity size was normal. Wall thickness was normal. Systolic function was normal. - Tricuspid valve: There was mild regurgitation. - Pulmonary arteries: Systolic pressure was within the normal range. PA peak pressure: 28 mm Hg (S).      Patient Profile     84 y.o. male with paroxysmal atrial flutter s/p ablation in 2015, SSS s/p PPM, diabetes, moderate non-obstructive CAD, and chronic diastolic heart failure here with atrial fibrillation with RVR.  Completely asymptomatic, elevated heart rate noted at PCP office visit.  Assessment & Plan    Atrial fibrillation with RVR -Hx of afib and aflutter ablation in 2015. Has been maintaining SR on BB with last PPM interrogation showing <1% afib. Found to be in afib with RVR yesterday at MD office. Pt asymptomatic. No recent illness or know provocation.  -On Eliquis for stroke risk reduction.  -Started on diltiazem infusion. Metoprolol resumed. Pt converted to NSR in the 60s and has maintained over night. Diltiazem weaned down to 5 mg/hr. -TSH at low end of normal, 0.46. T4 and free thyroxine in normal range.  -Pt likely can be discharged today. Will discuss with Dr. Oval Linsey. Will need to either switch IV cardizem to oral form, low dose as pt converted fairly easily and maintained over night with 5 mg/hr, 120 mg. Or could increase metoprolol.   Chest pain - In setting of afib with RVR. Has known coronary disease, nonobstructive by recent cath 03/2017. -Troponins mildly elevated in flat trend consistent with demand ischemia related to  tachyarrhythmia.  -No chest pain associated with this episode.  -Echo planned but not order. I placed order to be done prior to discharge or could be done as outpatient. Will discuss with Dr Oval Linsey.   CAD -nonobstructive by recent cath 03/2017. prox LCx, 50% mid LAD, 20% mid RCA and 30% distal RCA.  -Continue BB, Imdur, statin, ARB (currenlty on hold for increased Scr)  Hyperlipidemia  -LDL 49 at goal of <70. Continue current statin.   Renal insufficiency -SCr/BUN 1.26/30 on admission. Baseline creatinine ~1.0.  -ARB currently on hold.   -Awaiting BMet this am.    For questions or updates, please contact [place] Please consult [email or web address] for contact info under Cardiology/STEMI.      Signed, Daune Perch, NP  06/09/2017, 8:37 AM

## 2017-06-12 NOTE — Consult Note (Signed)
           [facility] CM Primary Care Navigator  06/12/2017  Donald [name] [birth year] [id number]   Attempt to seepatient at the bedsideto identify possible discharge needs but he was alreadydischargedper staff report.  Per chart review, patient was sent to the emergency room from primary care provider's office for management of atrial fibrillation with RVR (rapid ventricular response); was noted with elevated troponin level.  Patient was discharged home over the weekend.  Primary care provider's officeis listed asprovidingtransition of care (TOC).  Patient has discharge instruction to follow-up with cardiology on 06/20/17 at 1:15 pm.   For questions, please contact:  Dannielle Huh, BSN, RN- [facility] Primary Care Navigator  Telephone: [phone] [place]

## 2017-06-19 NOTE — Progress Notes (Signed)
Patient Care Team: Janith Lima, MD as PCP - General (Internal Medicine) Deboraha Sprang, MD as PCP - Cardiology (Cardiology) Franchot Gallo, MD (Urology) [name], [name], MD (Dermatology) Monna Fam, MD as Consulting Physician (Ophthalmology) Deboraha Sprang, MD as Consulting Physician (Cardiology)   HPI  Donald [name] is a 84 y.o. 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 (St Jude ).  He has had problems with chest pain    This has been a recurring issue. Myoview was mildly abnormal catheterization recommended but preprocedure labs have demonstrated a hemoglobin of 7 and it was deferred.    October 2015 Echo - Left ventricle: The cavity size was normal. Wall thickness was increased in a pattern of mild LVH. Systolic function was normal. The estimated ejection fraction was in the range of 55% to 60%.<<30% (10/15)  Aortic valve: Heavily calcirfied right coronary cusp. There was mild stenosis. There was mild regurgitation.BICUSPID mean gradient was 10.  - Mitral valve: There was mild regurgitation. - Left atrium: The atrium was mildly to moderately dilated. - Right atrium: The atrium was mildly dilated. - Atrial septum: No defect or patent foramen ovale was identified.   DATE TEST    10/15    Echo   EF 55 %  mild AS ( mean gradient 10) --? Bicuspid aortic valve  9/16 myoview EF 60-65%   mild ischemia  10/17 Echo EF 60-65%  No comment on bicuspid valve//mild - Mod  AI   11/18 Cath EF 55-65% Mod non obstructive CAD       Date CR K hgb TSH  10/17 0.97  4.6 13.2   0.56  4/18 1.07 5.2 13.9   1/19 1.26 5.1 14 0.46    \ He was admitted 1/24 because of atrial fibrillation with a rapid rate.  Device interrogation notes that had been present for about a week.  He had no associated symptoms.  He denies chest pain or shortness of breath or edema.  He has minor nuisance bleeding  Donald [name]  is going to go Past Medical History:  Diagnosis Date  . Abnormal CT scan, stomach 02/2014   Thickening of gastric fundus and cardia.  gastritis on EGD 03/2014  . Allergic rhinitis due to pollen   . Arthropathy, unspecified, site unspecified   . Atherosclerosis    a. Noted by abdominal CT 02/2014 (h/o normal nuc 2011).  . Atrial flutter ([place]) 02/24/14   s/p ablation 11/15  . Cardiomyopathy ([place])    tachy mediated - a. TEE (10/15):  EF 30%;  b. Echo after NSR restored (10/15):  mild LVH, EF 55-60%, mild AS, mild AI, mild MR, mild to mod LAE, mild RAE  . Diverticulosis    severe in descending, sigmoid colon.   . ED (erectile dysfunction)   . Esophageal stricture 03/2014   traversable with endoscope. not dilated.   . Gastric AVM   . GERD (gastroesophageal reflux disease) 03/2014   small HH and gastritis on EGD  . GI bleed   . Habitual alcohol use   . Hemorrhoids   . Hiatal hernia   . Hyperlipidemia   . Hypertension   . Obesity   . OSA on CPAP   . PAF (paroxysmal atrial fibrillation) ([place])   . Prostatitis, unspecified   . 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 Skin cancer    basal and squamous cell  Past Surgical History:  Procedure Laterality Date  . ATRIAL FLUTTER ABLATION N/A 03/19/2014   RFCA of atrial flutter by Dr Caryl Comes  . CARDIOVERSION N/A 02/24/2014   Procedure: CARDIOVERSION;  Surgeon: Lelon Perla, MD;  Location: [place];  Service: Cardiovascular;  Laterality: N/A;  . CARDIOVERSION Right 09/10/2014   Procedure: CARDIOVERSION;  Surgeon: Deboraha Sprang, MD;  Location: [facility] CATH LAB;  Service: Cardiovascular;  Laterality: Right;  . COLONOSCOPY N/A 04/23/2015   Procedure: COLONOSCOPY;  Surgeon: Ladene Artist, MD;  Location: [facility] ENDOSCOPY;  Service: Endoscopy;  Laterality: N/A;  . ENTEROSCOPY N/A 06/12/2015   Procedure: ENTEROSCOPY;  Surgeon: Ladene Artist, MD;  Location: [facility] ENDOSCOPY;  Service: Endoscopy;  Laterality: N/A;  . EP IMPLANTABLE  DEVICE N/A 02/15/2016   Procedure: Pacemaker Implant;  Surgeon: Evans Lance, MD;  Location: [place] CV LAB;  Service: Cardiovascular;  Laterality: N/A;  . ESOPHAGOGASTRODUODENOSCOPY N/A 04/09/2014   Procedure: ESOPHAGOGASTRODUODENOSCOPY (EGD);  Surgeon: Inda Castle, MD;  Location: [place];  Service: Endoscopy;  Laterality: N/A;  . ESOPHAGOGASTRODUODENOSCOPY N/A 04/23/2015   Procedure: ESOPHAGOGASTRODUODENOSCOPY (EGD);  Surgeon: Ladene Artist, MD;  Location: [facility] ENDOSCOPY;  Service: Endoscopy;  Laterality: N/A;  . INGUINAL HERNIA REPAIR     right  . INGUINAL HERNIA REPAIR     left  . LEFT HEART CATH AND CORONARY ANGIOGRAPHY N/A 03/16/2017   Procedure: LEFT HEART CATH AND CORONARY ANGIOGRAPHY;  Surgeon: Burnell Blanks, MD;  Location: [place] CV LAB;  Service: Cardiovascular;  Laterality: N/A;  . ORIF fracture of the elbow    . SKIN CANCER EXCISION  05/21/12   Squamous cell ca  . TEE WITHOUT CARDIOVERSION N/A 02/24/2014   Procedure: TRANSESOPHAGEAL ECHOCARDIOGRAM (TEE);  Surgeon: Lelon Perla, MD;  Location: [facility] ENDOSCOPY;  Service: Cardiovascular;  Laterality: N/A;  . TONSILLECTOMY      Current Outpatient Medications  Medication Sig Dispense Refill  . atorvastatin (LIPITOR) 40 MG tablet TAKE 1 TABLET ONCE DAILY. (Patient taking differently: TAKE 1 TABLET (40mg ) ONCE DAILY.) 90 tablet 0  . diltiazem (CARDIZEM CD) 120 MG 24 hr capsule Take 1 capsule (120 mg total) by mouth daily. 90 capsule 3  . ELIQUIS 5 MG TABS tablet TAKE 1 TABLET BY MOUTH TWICE DAILY. (Patient taking differently: TAKE 1 TABLET (5mg ) BY MOUTH TWICE DAILY.) 60 tablet 9  . ferrous sulfate 325 (65 FE) MG tablet Take 1 tablet (325 mg total) by mouth daily with breakfast. 60 tablet 3  . fluticasone (FLONASE) 50 MCG/ACT nasal spray 1 spray in each nostril daily as needed for stuffiness (Patient taking differently: Place 1 spray into both nostrils daily as needed for allergies. ) 16 g 11  . furosemide  (LASIX) 40 MG tablet Take 0.5 tablets (20 mg total) by mouth daily. 90 tablet 2  . isosorbide mononitrate (IMDUR) 30 MG 24 hr tablet Take 1 tablet (30 mg total) by mouth daily. 30 tablet 3  . loratadine (CLARITIN) 10 MG tablet Take 10 mg by mouth daily as needed for allergies.     Marland Kitchen MAGNESIUM PO Take 6 tablets by mouth at bedtime.     . metoprolol tartrate (LOPRESSOR) 50 MG tablet TAKE 1 TABLET BY MOUTH TWICE DAILY. (Patient taking differently: TAKE 1 TABLET (50 mg) BY MOUTH TWICE DAILY.) 180 tablet 3  . Multiple Vitamins-Minerals (MULTIVITAMIN PO) Take 1 tablet by mouth 2 (two) times daily.    . nitroGLYCERIN (NITROSTAT) 0.4 MG SL tablet Place 1 tablet (0.4 mg total)  under the tongue every 5 (five) minutes as needed for chest pain. 25 tablet 3  . pantoprazole (PROTONIX) 40 MG tablet TAKE 1 TABLET EACH DAY. (Patient taking differently: Take 40 mg by mouth daily. ) 90 tablet 1   No current facility-administered medications for this visit.     No Known Allergies  Review of Systems negative except from HPI and PMH  Physical Exam BP 112/68   Pulse 60   Ht 5\' 8"  (1.727 m)   Wt 220 lb 12.8 oz (100.2 kg)   BMI 33.57 kg/m  Well developed and nourished in no acute distress HENT normal Neck supple with JVP-flat Carotids brisk and full without bruits Clear Regular rate and rhythm, 2/6 murmur  Abd-soft with active BS without hepatomegaly No Clubbing cyanosis edema Skin-warm and dry A & Oriented  Grossly normal sensory and motor function  EcG atrial pacing 60 20/10/42  Assessment and  Plan  Atrial fibrillation/flutter-paroxysmal  CHADS-VASc score 5 (age-60 hypertension-1 cardiomyopathy-1 vascular disease-1)  Aortic Valve Bicuspid previously described but no longer evident  Cardiomyopathy-resolved  Pacemaker-St. Jude  Hypertension  Exertional chest discomfort  Cath non obstructive disease    Blood pressure well controlled  No significant bleeding  Recurrent atrial  arrhythmias.  In the event that his episodes last  longer such that they are present on 2 consecutive mornings he is to call the  atrial fibrillation clinic consideration of cardioversion.  If the burden increases significantly we will have to discuss antiarrhythmic therapy  We spent more than 50% of our >25 min visit in face to face counseling regarding the above

## 2017-06-20 ENCOUNTER — Encounter: Payer: Self-pay | Admitting: Internal Medicine

## 2017-06-20 ENCOUNTER — Ambulatory Visit: Payer: PPO | Admitting: Internal Medicine

## 2017-06-20 VITALS — BP 112/68 | HR 60 | Ht 68.0 in | Wt 220.8 lb

## 2017-06-20 DIAGNOSIS — I480 Paroxysmal atrial fibrillation: Secondary | ICD-10-CM

## 2017-06-20 NOTE — Patient Instructions (Signed)
Medication Instructions:  Your physician recommends that you continue on your current medications as directed. Please refer to the Current Medication list given to you today.  Labwork: None ordered.  Testing/Procedures: None ordered.  Follow-Up: Your physician recommends that you schedule a follow-up appointment in:6 months with Dr Caryl Comes   Remote monitoring is used to monitor your Pacemaker from home. This monitoring reduces the number of office visits required to check your device to one time per year. It allows [place] to keep an eye on the functioning of your device to ensure it is working properly. You are scheduled for a device check from home on 08/21/2017. You may send your transmission at any time that day. If you have a wireless device, the transmission will be sent automatically. After your physician reviews your transmission, you will receive a postcard with your next transmission date.   Any Other Special Instructions Will Be Listed Below (If Applicable).  Please call the Afib clinic for additional information regarding their program.   If you need a refill on your cardiac medications before your next appointment, please call your pharmacy.

## 2017-06-22 LAB — CUP PACEART INCLINIC DEVICE CHECK
Brady Statistic RA Percent Paced: 80 %
Brady Statistic RV Percent Paced: 0.35 %
Implantable Lead Implant Date: 20171002
Implantable Lead Location: 753859
Implantable Pulse Generator Implant Date: 20171002
Lead Channel Impedance Value: 387.5 Ohm
Lead Channel Impedance Value: 512.5 Ohm
Lead Channel Pacing Threshold Amplitude: 0.75 V
Lead Channel Pacing Threshold Pulse Width: 0.5 ms
Lead Channel Sensing Intrinsic Amplitude: 12 mV
Lead Channel Setting Pacing Amplitude: 0.875
Lead Channel Setting Pacing Amplitude: 2 V
Lead Channel Setting Pacing Pulse Width: 0.5 ms
Lead Channel Setting Sensing Sensitivity: 2 mV
MDC IDC LEAD IMPLANT DT: 20171002
MDC IDC LEAD LOCATION: 753860
MDC IDC MSMT BATTERY REMAINING LONGEVITY: 103 mo
MDC IDC MSMT BATTERY VOLTAGE: 3.01 V
MDC IDC MSMT LEADCHNL RA PACING THRESHOLD AMPLITUDE: 0.5 V
MDC IDC MSMT LEADCHNL RA PACING THRESHOLD AMPLITUDE: 0.5 V
MDC IDC MSMT LEADCHNL RA PACING THRESHOLD PULSEWIDTH: 0.5 ms
MDC IDC MSMT LEADCHNL RA PACING THRESHOLD PULSEWIDTH: 0.5 ms
MDC IDC MSMT LEADCHNL RA SENSING INTR AMPL: 3.7 mV
MDC IDC MSMT LEADCHNL RV PACING THRESHOLD AMPLITUDE: 0.75 V
MDC IDC MSMT LEADCHNL RV PACING THRESHOLD PULSEWIDTH: 0.5 ms
MDC IDC SESS DTM: 20190205184254
Pulse Gen Model: 2272
Pulse Gen Serial Number: 7951215

## 2017-07-24 DIAGNOSIS — G4733 Obstructive sleep apnea (adult) (pediatric): Secondary | ICD-10-CM | POA: Diagnosis not present

## 2017-08-14 ENCOUNTER — Other Ambulatory Visit: Payer: Self-pay | Admitting: Internal Medicine

## 2017-08-14 DIAGNOSIS — L918 Other hypertrophic disorders of the skin: Secondary | ICD-10-CM | POA: Diagnosis not present

## 2017-08-14 DIAGNOSIS — C44222 Squamous cell carcinoma of skin of right ear and external auricular canal: Secondary | ICD-10-CM | POA: Diagnosis not present

## 2017-08-14 DIAGNOSIS — L720 Epidermal cyst: Secondary | ICD-10-CM | POA: Diagnosis not present

## 2017-08-14 DIAGNOSIS — C44212 Basal cell carcinoma of skin of right ear and external auricular canal: Secondary | ICD-10-CM | POA: Diagnosis not present

## 2017-08-14 DIAGNOSIS — C44311 Basal cell carcinoma of skin of nose: Secondary | ICD-10-CM | POA: Diagnosis not present

## 2017-08-14 DIAGNOSIS — Z85828 Personal history of other malignant neoplasm of skin: Secondary | ICD-10-CM | POA: Diagnosis not present

## 2017-08-14 DIAGNOSIS — C44319 Basal cell carcinoma of skin of other parts of face: Secondary | ICD-10-CM | POA: Diagnosis not present

## 2017-08-14 DIAGNOSIS — L570 Actinic keratosis: Secondary | ICD-10-CM | POA: Diagnosis not present

## 2017-08-14 DIAGNOSIS — L821 Other seborrheic keratosis: Secondary | ICD-10-CM | POA: Diagnosis not present

## 2017-08-14 DIAGNOSIS — D485 Neoplasm of uncertain behavior of skin: Secondary | ICD-10-CM | POA: Diagnosis not present

## 2017-08-21 ENCOUNTER — Ambulatory Visit (INDEPENDENT_AMBULATORY_CARE_PROVIDER_SITE_OTHER): Payer: PPO | Admitting: *Deleted

## 2017-08-21 DIAGNOSIS — I495 Sick sinus syndrome: Secondary | ICD-10-CM

## 2017-08-22 NOTE — Progress Notes (Signed)
Remote pacemaker transmission.   

## 2017-08-23 DIAGNOSIS — G4733 Obstructive sleep apnea (adult) (pediatric): Secondary | ICD-10-CM | POA: Diagnosis not present

## 2017-08-24 ENCOUNTER — Encounter: Payer: Self-pay | Admitting: Cardiology

## 2017-09-06 ENCOUNTER — Other Ambulatory Visit: Payer: Self-pay | Admitting: Internal Medicine

## 2017-09-07 DIAGNOSIS — C44222 Squamous cell carcinoma of skin of right ear and external auricular canal: Secondary | ICD-10-CM | POA: Diagnosis not present

## 2017-09-07 DIAGNOSIS — C44311 Basal cell carcinoma of skin of nose: Secondary | ICD-10-CM | POA: Diagnosis not present

## 2017-09-07 DIAGNOSIS — Z85828 Personal history of other malignant neoplasm of skin: Secondary | ICD-10-CM | POA: Diagnosis not present

## 2017-09-11 DIAGNOSIS — G4733 Obstructive sleep apnea (adult) (pediatric): Secondary | ICD-10-CM | POA: Diagnosis not present

## 2017-09-13 DIAGNOSIS — C44321 Squamous cell carcinoma of skin of nose: Secondary | ICD-10-CM | POA: Diagnosis not present

## 2017-09-13 DIAGNOSIS — Z85828 Personal history of other malignant neoplasm of skin: Secondary | ICD-10-CM | POA: Diagnosis not present

## 2017-09-13 LAB — CUP PACEART REMOTE DEVICE CHECK
Battery Remaining Percentage: 95.5 %
Battery Voltage: 3.01 V
Brady Statistic AP VP Percent: 1 %
Brady Statistic AP VS Percent: 95 %
Brady Statistic AS VS Percent: 4.9 %
Brady Statistic RV Percent Paced: 1 %
Implantable Lead Implant Date: 20171002
Implantable Lead Location: 753859
Implantable Pulse Generator Implant Date: 20171002
Lead Channel Impedance Value: 480 Ohm
Lead Channel Pacing Threshold Amplitude: 0.5 V
Lead Channel Pacing Threshold Amplitude: 0.875 V
Lead Channel Pacing Threshold Pulse Width: 0.5 ms
Lead Channel Sensing Intrinsic Amplitude: 3.6 mV
Lead Channel Setting Pacing Amplitude: 1.125
Lead Channel Setting Pacing Pulse Width: 0.5 ms
MDC IDC LEAD IMPLANT DT: 20171002
MDC IDC LEAD LOCATION: 753860
MDC IDC MSMT BATTERY REMAINING LONGEVITY: 115 mo
MDC IDC MSMT LEADCHNL RA IMPEDANCE VALUE: 360 Ohm
MDC IDC MSMT LEADCHNL RA PACING THRESHOLD PULSEWIDTH: 0.5 ms
MDC IDC MSMT LEADCHNL RV SENSING INTR AMPL: 12 mV
MDC IDC PG SERIAL: 7951215
MDC IDC SESS DTM: 20190408060038
MDC IDC SET LEADCHNL RA PACING AMPLITUDE: 2 V
MDC IDC SET LEADCHNL RV SENSING SENSITIVITY: 2 mV
MDC IDC STAT BRADY AS VP PERCENT: 1 %
MDC IDC STAT BRADY RA PERCENT PACED: 95 %

## 2017-09-23 ENCOUNTER — Other Ambulatory Visit: Payer: Self-pay | Admitting: Internal Medicine

## 2017-09-25 ENCOUNTER — Telehealth: Payer: Self-pay | Admitting: Internal Medicine

## 2017-09-25 MED ORDER — PANTOPRAZOLE SODIUM 40 MG PO TBEC: 40.0000 mg | DELAYED_RELEASE_TABLET | Freq: Every day | ORAL | 0 refills | Status: DC

## 2017-09-25 NOTE — Telephone Encounter (Signed)
Patient states he needs medication pantoprazole refilled, and schedule follow up with Dr.Pyrtle when he has an opening.

## 2017-10-02 IMAGING — CR DG CHEST 1V PORT
1 series · 1 of 1 positions shown · non-contrast
Comparison: Chest radiograph dated 07/17/2012 and read radiograph
dated 06/26/2014

CLINICAL DATA: 82-year-old male with shortness of breath

EXAM:
PORTABLE CHEST 1 VIEW

[AP]
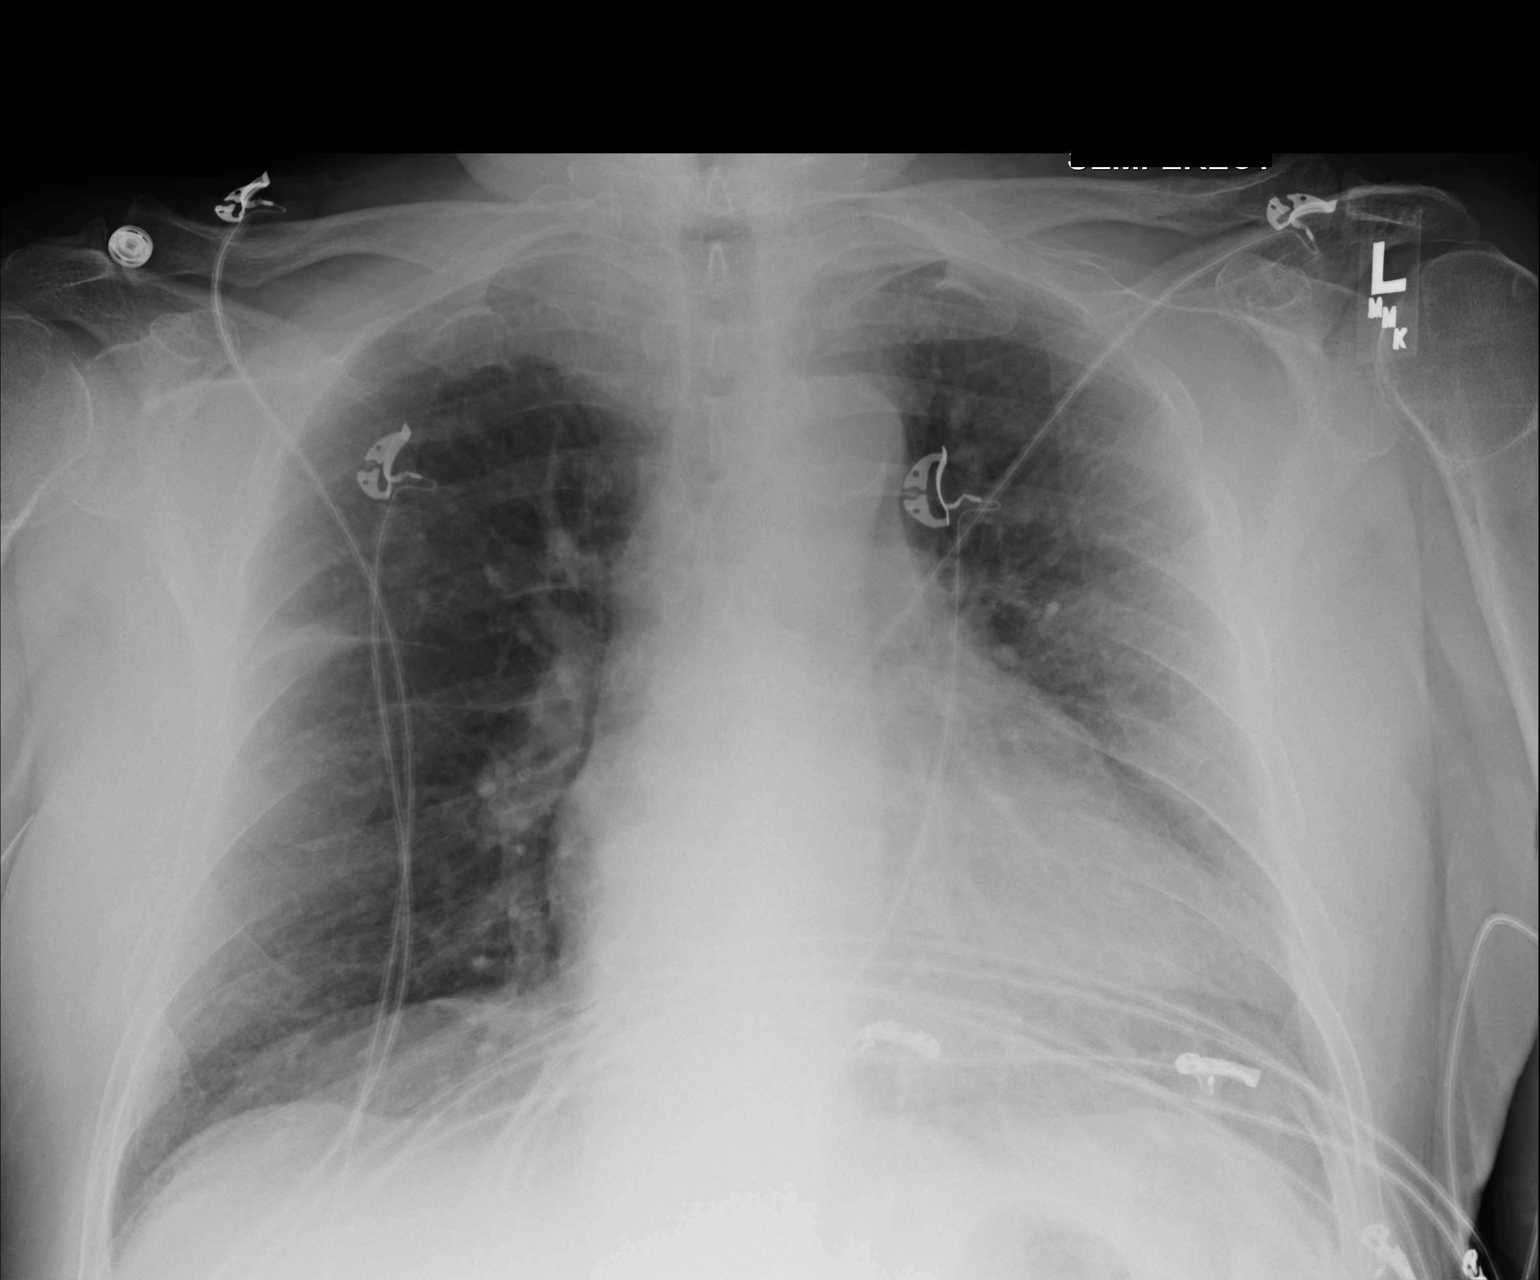

[1 of 1 positions shown; findings below may reference images not displayed]

FINDINGS: Single portable view of the chest does not demonstrate any focal
consolidation. There is minimal bibasilar atelectatic changes. Trace
fluid may be present in the right minor fissure. There is no
pneumothorax. Stable cardiac silhouette. The osseous structures are
grossly unremarkable.
IMPRESSION: No focal consolidation.

## 2017-10-06 DIAGNOSIS — G4733 Obstructive sleep apnea (adult) (pediatric): Secondary | ICD-10-CM | POA: Diagnosis not present

## 2017-10-31 ENCOUNTER — Telehealth: Payer: Self-pay | Admitting: Emergency Medicine

## 2017-10-31 NOTE — Telephone Encounter (Signed)
Called patient to schedule AWV. Patient will call back to schedule appointment at a later date.

## 2017-11-10 ENCOUNTER — Other Ambulatory Visit: Payer: Self-pay | Admitting: Internal Medicine

## 2017-11-13 DIAGNOSIS — L218 Other seborrheic dermatitis: Secondary | ICD-10-CM | POA: Diagnosis not present

## 2017-11-13 DIAGNOSIS — C44219 Basal cell carcinoma of skin of left ear and external auricular canal: Secondary | ICD-10-CM | POA: Diagnosis not present

## 2017-11-13 DIAGNOSIS — C44519 Basal cell carcinoma of skin of other part of trunk: Secondary | ICD-10-CM | POA: Diagnosis not present

## 2017-11-13 DIAGNOSIS — L570 Actinic keratosis: Secondary | ICD-10-CM | POA: Diagnosis not present

## 2017-11-13 DIAGNOSIS — D485 Neoplasm of uncertain behavior of skin: Secondary | ICD-10-CM | POA: Diagnosis not present

## 2017-11-13 DIAGNOSIS — C44319 Basal cell carcinoma of skin of other parts of face: Secondary | ICD-10-CM | POA: Diagnosis not present

## 2017-11-13 DIAGNOSIS — C44619 Basal cell carcinoma of skin of left upper limb, including shoulder: Secondary | ICD-10-CM | POA: Diagnosis not present

## 2017-11-13 DIAGNOSIS — C44719 Basal cell carcinoma of skin of left lower limb, including hip: Secondary | ICD-10-CM | POA: Diagnosis not present

## 2017-11-13 DIAGNOSIS — Z85828 Personal history of other malignant neoplasm of skin: Secondary | ICD-10-CM | POA: Diagnosis not present

## 2017-11-20 ENCOUNTER — Ambulatory Visit (INDEPENDENT_AMBULATORY_CARE_PROVIDER_SITE_OTHER): Payer: PPO | Admitting: *Deleted

## 2017-11-20 DIAGNOSIS — I495 Sick sinus syndrome: Secondary | ICD-10-CM

## 2017-11-21 NOTE — Progress Notes (Signed)
Remote pacemaker transmission.   

## 2017-11-27 DIAGNOSIS — G4733 Obstructive sleep apnea (adult) (pediatric): Secondary | ICD-10-CM | POA: Diagnosis not present

## 2017-11-28 LAB — CUP PACEART REMOTE DEVICE CHECK
Battery Remaining Longevity: 119 mo
Battery Remaining Percentage: 95.5 %
Battery Voltage: 3.01 V
Brady Statistic AP VS Percent: 96 %
Brady Statistic AS VS Percent: 3.3 %
Brady Statistic RV Percent Paced: 1 %
Implantable Lead Implant Date: 20171002
Implantable Lead Location: 753859
Implantable Pulse Generator Implant Date: 20171002
Lead Channel Impedance Value: 430 Ohm
Lead Channel Pacing Threshold Amplitude: 0.5 V
Lead Channel Pacing Threshold Amplitude: 0.625 V
Lead Channel Pacing Threshold Pulse Width: 0.5 ms
Lead Channel Sensing Intrinsic Amplitude: 3.9 mV
Lead Channel Setting Pacing Amplitude: 0.875
Lead Channel Setting Pacing Amplitude: 2 V
Lead Channel Setting Pacing Pulse Width: 0.5 ms
Lead Channel Setting Sensing Sensitivity: 2 mV
MDC IDC LEAD IMPLANT DT: 20171002
MDC IDC LEAD LOCATION: 753860
MDC IDC MSMT LEADCHNL RA PACING THRESHOLD PULSEWIDTH: 0.5 ms
MDC IDC MSMT LEADCHNL RV IMPEDANCE VALUE: 510 Ohm
MDC IDC MSMT LEADCHNL RV SENSING INTR AMPL: 12 mV
MDC IDC PG SERIAL: 7951215
MDC IDC SESS DTM: 20190708060012
MDC IDC STAT BRADY AP VP PERCENT: 1 %
MDC IDC STAT BRADY AS VP PERCENT: 1 %
MDC IDC STAT BRADY RA PERCENT PACED: 96 %

## 2017-12-24 ENCOUNTER — Other Ambulatory Visit: Payer: Self-pay | Admitting: Internal Medicine

## 2017-12-25 ENCOUNTER — Telehealth: Payer: Self-pay | Admitting: Internal Medicine

## 2017-12-25 MED ORDER — PANTOPRAZOLE SODIUM 40 MG PO TBEC: 40.0000 mg | DELAYED_RELEASE_TABLET | Freq: Every day | ORAL | 0 refills | Status: DC

## 2017-12-25 NOTE — Telephone Encounter (Signed)
Rx sent. Must keep 02/15/18 appt for further refills.

## 2017-12-25 NOTE — Telephone Encounter (Signed)
Pt just made appt with Dr. Hilarie Fredrickson for 02/15/18 and he is requesting rf for pantoprazole sent to [facility].

## 2017-12-27 DIAGNOSIS — G4733 Obstructive sleep apnea (adult) (pediatric): Secondary | ICD-10-CM | POA: Diagnosis not present

## 2018-01-02 ENCOUNTER — Encounter: Payer: Self-pay | Admitting: Internal Medicine

## 2018-01-02 ENCOUNTER — Ambulatory Visit: Payer: PPO | Admitting: Internal Medicine

## 2018-01-02 VITALS — BP 128/78 | HR 128 | Ht 69.0 in | Wt 223.2 lb

## 2018-01-02 DIAGNOSIS — Z950 Presence of cardiac pacemaker: Secondary | ICD-10-CM

## 2018-01-02 DIAGNOSIS — I10 Essential (primary) hypertension: Secondary | ICD-10-CM

## 2018-01-02 DIAGNOSIS — I480 Paroxysmal atrial fibrillation: Secondary | ICD-10-CM

## 2018-01-02 NOTE — Patient Instructions (Signed)
Medication Instructions:  Your physician has recommended you make the following change in your medication:   1. Increase your Diltiazem to 120mg , two times per day. So, take another dose this evening. Take one dose in the morning and one in the evening on Wed and Thurs.   Labwork: You will have labs drawn today: CBC and BMP   Testing/Procedures: Your physician has recommended that you have a Cardioversion (DCCV). Electrical Cardioversion uses a jolt of electricity to your heart either through paddles or wired patches attached to your chest. This is a controlled, usually prescheduled, procedure. Defibrillation is done under light anesthesia in the hospital, and you usually go home the day of the procedure. This is done to get your heart back into a normal rhythm. You are not awake for the procedure. Please see the instruction sheet given to you today.    Follow-Up: Your physician recommends that you schedule a follow-up appointment in: One Year with Dr Caryl Comes.  Remote monitoring is used to monitor your Pacemaker of ICD from home. This monitoring reduces the number of office visits required to check your device to one time per year. It allows [place] to keep an eye on the functioning of your device to ensure it is working properly. You are scheduled for a device check from home on 02/20/2018. You may send your transmission at any time that day. If you have a wireless device, the transmission will be sent automatically. After your physician reviews your transmission, you will receive a postcard with your next transmission date.    Any Other Special Instructions Will Be Listed Below (If Applicable).  Dear Donald [name],  You are scheduled for a Cardioversion on Friday Sept 23 with Dr. Johnsie Cancel.   Please arrive at the [facility] (Main Entrance A) at [facility]: [address] [place], [place] [address] at 7:00am.   DIET: Nothing to eat or drink after midnight except a sip of water with  medications (see medication instructions below)  Medication Instructions:  Hold your Lasix the morning of you procedure. Take your normal dose of Diltiazem the morning of your procedure. You may take the remainder of your medications with a sip of water.  Continue your anticoagulant: Eliquis. Do not miss a dose. You will need to continue your anticoagulant after your procedure until you are told by your provider that it is safe to stop.   Labs: You had labs drawn in the office on 8/20  You must have a responsible person to drive you home and stay in the waiting area during your procedure. Failure to do so could result in cancellation.  Bring your insurance cards.  *Special Note: Every effort is made to have your procedure done on time. Occasionally there are emergencies that occur at the hospital that may cause delays. Please be patient if a delay does occur.     If you need a refill on your cardiac medications before your next appointment, please call your pharmacy.

## 2018-01-02 NOTE — Progress Notes (Signed)
Patient Care Team: Janith Lima, MD as PCP - General (Internal Medicine) Deboraha Sprang, MD as PCP - Cardiology (Cardiology) Franchot Gallo, MD (Urology) [name], [name], MD (Dermatology) Monna Fam, MD as Consulting Physician (Ophthalmology) Deboraha Sprang, MD as Consulting Physician (Cardiology)   HPI  Donald [name] is a 84 y.o. 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 (St Jude ).  Anticoagulated with apixaban   DATE TEST    10/15    Echo   EF 55 %  mild AS ( mean gradient 10) --? Bicuspid aortic valve  9/16 myoview EF 60-65%   mild ischemia  10/17 Echo EF 60-65%  No comment on bicuspid valve//mild - Mod  AI   11/18 Cath EF 55-65% Mod non obstructive CAD      Date CR K hgb TSH  10/17 0.97  4.6 13.2   0.56  4/18 1.07 5.2 13.9   1/19 1.26 5.1 14 0.46         He comes in today without complaints.  There is no edema.  He has chronic mild stable shortness of breath particularly with climbing hills.  He does not have chest discomfort although sometimes retrospectively he thinks he does when he is been told he has atrial fibrillation   thromboembolic risk factors ( age  -2, HTN-1, ) for a CHADSVASc Score of 3   On Anticoagulation;  No bleeding issues     Past Medical History:  Diagnosis Date  . Abnormal CT scan, stomach 02/2014   Thickening of gastric fundus and cardia.  gastritis on EGD 03/2014  . Allergic rhinitis due to pollen   . Arthropathy, unspecified, site unspecified   . Atherosclerosis    a. Noted by abdominal CT 02/2014 (h/o normal nuc 2011).  . Atrial flutter ([place]) 02/24/14   s/p ablation 11/15  . Cardiomyopathy ([place])    tachy mediated - a. TEE (10/15):  EF 30%;  b. Echo after NSR restored (10/15):  mild LVH, EF 55-60%, mild AS, mild AI, mild MR, mild to mod LAE, mild RAE  . Diverticulosis    severe in descending, sigmoid colon.   . ED (erectile dysfunction)   .  Esophageal stricture 03/2014   traversable with endoscope. not dilated.   . Gastric AVM   . GERD (gastroesophageal reflux disease) 03/2014   small HH and gastritis on EGD  . GI bleed   . Habitual alcohol use   . Hemorrhoids   . Hiatal hernia   . Hyperlipidemia   . Hypertension   . Obesity   . OSA on CPAP   . PAF (paroxysmal atrial fibrillation) ([place])   . Prostatitis, unspecified   . 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 Skin cancer    basal and squamous cell    Past Surgical History:  Procedure Laterality Date  . ATRIAL FLUTTER ABLATION N/A 03/19/2014   RFCA of atrial flutter by Dr Caryl Comes  . CARDIOVERSION N/A 02/24/2014   Procedure: CARDIOVERSION;  Surgeon: Lelon Perla, MD;  Location: [facility];  Service: Cardiovascular;  Laterality: N/A;  . CARDIOVERSION Right 09/10/2014   Procedure: CARDIOVERSION;  Surgeon: Deboraha Sprang, MD;  Location: [facility] CATH LAB;  Service: Cardiovascular;  Laterality: Right;  . COLONOSCOPY N/A 04/23/2015   Procedure: COLONOSCOPY;  Surgeon: Ladene Artist, MD;  Location: [facility] ENDOSCOPY;  Service: Endoscopy;  Laterality: N/A;  . ENTEROSCOPY N/A  06/12/2015   Procedure: ENTEROSCOPY;  Surgeon: Ladene Artist, MD;  Location: [facility] ENDOSCOPY;  Service: Endoscopy;  Laterality: N/A;  . EP IMPLANTABLE DEVICE N/A 02/15/2016   Procedure: Pacemaker Implant;  Surgeon: Evans Lance, MD;  Location: [place] CV LAB;  Service: Cardiovascular;  Laterality: N/A;  . ESOPHAGOGASTRODUODENOSCOPY N/A 04/09/2014   Procedure: ESOPHAGOGASTRODUODENOSCOPY (EGD);  Surgeon: Inda Castle, MD;  Location: [place];  Service: Endoscopy;  Laterality: N/A;  . ESOPHAGOGASTRODUODENOSCOPY N/A 04/23/2015   Procedure: ESOPHAGOGASTRODUODENOSCOPY (EGD);  Surgeon: Ladene Artist, MD;  Location: [facility] ENDOSCOPY;  Service: Endoscopy;  Laterality: N/A;  . INGUINAL HERNIA REPAIR     right  . INGUINAL HERNIA REPAIR     left  . LEFT HEART CATH AND CORONARY ANGIOGRAPHY N/A  03/16/2017   Procedure: LEFT HEART CATH AND CORONARY ANGIOGRAPHY;  Surgeon: Burnell Blanks, MD;  Location: [place] CV LAB;  Service: Cardiovascular;  Laterality: N/A;  . ORIF fracture of the elbow    . SKIN CANCER EXCISION  05/21/12   Squamous cell ca  . TEE WITHOUT CARDIOVERSION N/A 02/24/2014   Procedure: TRANSESOPHAGEAL ECHOCARDIOGRAM (TEE);  Surgeon: Lelon Perla, MD;  Location: [facility] ENDOSCOPY;  Service: Cardiovascular;  Laterality: N/A;  . TONSILLECTOMY      Current Outpatient Medications  Medication Sig Dispense Refill  . apixaban (ELIQUIS) 5 MG TABS tablet Take 5 mg by mouth 2 (two) times daily.    Marland Kitchen atorvastatin (LIPITOR) 40 MG tablet TAKE 1 TABLET ONCE DAILY. 90 tablet 0  . diltiazem (CARDIZEM CD) 120 MG 24 hr capsule Take 1 capsule (120 mg total) by mouth daily. 90 capsule 3  . ferrous sulfate 325 (65 FE) MG tablet Take 1 tablet (325 mg total) by mouth daily with breakfast. 60 tablet 3  . fluticasone (FLONASE) 50 MCG/ACT nasal spray 1 spray in each nostril daily as needed for stuffiness 16 g 11  . furosemide (LASIX) 20 MG tablet Take 20 mg by mouth daily.    . isosorbide mononitrate (IMDUR) 30 MG 24 hr tablet Take 1 tablet (30 mg total) by mouth daily. 30 tablet 3  . loratadine (CLARITIN) 10 MG tablet Take 10 mg by mouth daily as needed for allergies.     Marland Kitchen MAGNESIUM PO Take 6 tablets by mouth at bedtime.     . metoprolol tartrate (LOPRESSOR) 50 MG tablet Take 50 mg by mouth daily.    . Multiple Vitamins-Minerals (MULTIVITAMIN PO) Take 1 tablet by mouth 2 (two) times daily.    . nitroGLYCERIN (NITROSTAT) 0.4 MG SL tablet PLACE 1 TABLET UNDER THE TONGUE EVERY 5 MINUTES AS NEEDED FOR CHEST PAIN, MAY REPEAT FOR 3 DOSES. 25 tablet 5  . pantoprazole (PROTONIX) 40 MG tablet Take 1 tablet (40 mg total) by mouth daily. MUST KEEP 02/15/18 APPT FOR FURTHER REFILLS 90 tablet 0   No current facility-administered medications for this visit.     No Known Allergies  Review of  Systems negative except from HPI and PMH  Physical Exam BP 128/78   Pulse (!) 128   Ht 5\' 9"  (1.753 m)   Wt 223 lb 3.2 oz (101.2 kg)   BMI 32.96 kg/m  Well developed and nourished in no acute distress HENT normal Neck supple with JVP-flat Carotids brisk and full without bruits Clear Irregularly irregular rate and rhythm with a rapid ventricular response, no murmurs or gallops Abd-soft with active BS without hepatomegaly No Clubbing cyanosis edema Skin-warm and dry A & Oriented  Grossly normal  sensory and motor function     EcG afib at 114   Assessment and  Plan  Atrial fibrillation/flutter-paroxysmal  CHADS-VASc score 5 (age-73 hypertension-1 cardiomyopathy-1 vascular disease-1)  Aortic Valve Bicuspid previously described but no longer evident  Cardiomyopathy-resolved  Pacemaker-St. Jude  Hypertension  Exertional chest discomfort  Cath non obstructive disease    Persistent atrial fibrillation.  Some chest discomfort associated with it.  We will undertake cardioversion.  He has been compliant with his Eliquis.  We will have him increase his diltiazem to twice daily for the next 3 days

## 2018-01-03 ENCOUNTER — Telehealth: Payer: Self-pay | Admitting: Internal Medicine

## 2018-01-03 LAB — BASIC METABOLIC PANEL
BUN / CREAT RATIO: 18 (ref 10–24)
BUN: 23 mg/dL (ref 8–27)
CHLORIDE: 104 mmol/L (ref 96–106)
CO2: 24 mmol/L (ref 20–29)
Calcium: 9.8 mg/dL (ref 8.6–10.2)
Creatinine, Ser: 1.26 mg/dL (ref 0.76–1.27)
GFR calc Af Amer: 61 mL/min/{1.73_m2} (ref >59)
GFR calc non Af Amer: 53 mL/min/{1.73_m2} — ABNORMAL LOW (ref >59)
GLUCOSE: 101 mg/dL — AB (ref 65–99)
POTASSIUM: 5.5 mmol/L — AB (ref 3.5–5.2)
SODIUM: 143 mmol/L (ref 134–144)

## 2018-01-03 LAB — CUP PACEART INCLINIC DEVICE CHECK
Battery Remaining Longevity: 116 mo
Battery Voltage: 3.01 V
Brady Statistic RV Percent Paced: 0.35 %
Implantable Lead Implant Date: 20171002
Implantable Lead Location: 753859
Implantable Pulse Generator Implant Date: 20171002
Lead Channel Impedance Value: 412.5 Ohm
Lead Channel Impedance Value: 512.5 Ohm
Lead Channel Pacing Threshold Amplitude: 0.75 V
Lead Channel Pacing Threshold Pulse Width: 0.5 ms
Lead Channel Setting Pacing Amplitude: 2 V
Lead Channel Setting Pacing Amplitude: 2.5 V
Lead Channel Setting Pacing Pulse Width: 0.5 ms
Lead Channel Setting Sensing Sensitivity: 2 mV
MDC IDC LEAD IMPLANT DT: 20171002
MDC IDC LEAD LOCATION: 753860
MDC IDC MSMT LEADCHNL RA SENSING INTR AMPL: 1.1 mV
MDC IDC MSMT LEADCHNL RV PACING THRESHOLD AMPLITUDE: 0.75 V
MDC IDC MSMT LEADCHNL RV PACING THRESHOLD PULSEWIDTH: 0.5 ms
MDC IDC MSMT LEADCHNL RV SENSING INTR AMPL: 12 mV
MDC IDC SESS DTM: 20190820192619
MDC IDC STAT BRADY RA PERCENT PACED: 96 %
Pulse Gen Serial Number: 7951215

## 2018-01-03 LAB — CBC
Hematocrit: 43.5 % (ref 37.5–51.0)
Hemoglobin: 14.6 g/dL (ref 13.0–17.7)
MCH: 33.2 pg — AB (ref 26.6–33.0)
MCHC: 33.6 g/dL (ref 31.5–35.7)
MCV: 99 fL — ABNORMAL HIGH (ref 79–97)
PLATELETS: 215 10*3/uL (ref 150–450)
RBC: 4.4 x10E6/uL (ref 4.14–5.80)
RDW: 14.5 % (ref 12.3–15.4)
WBC: 8.2 10*3/uL (ref 3.4–10.8)

## 2018-01-03 NOTE — Telephone Encounter (Signed)
New Message:   Pt has questions about his appointment 01/02/18

## 2018-01-04 ENCOUNTER — Encounter (HOSPITAL_COMMUNITY): Payer: Self-pay | Admitting: Certified Registered"

## 2018-01-04 NOTE — Telephone Encounter (Signed)
Pt contacted through [place].

## 2018-01-04 NOTE — Anesthesia Preprocedure Evaluation (Deleted)
Anesthesia Evaluation  Patient identified by MRN, date of birth, ID band Patient awake    Reviewed: Allergy & Precautions, NPO status , Patient's Chart, lab work & pertinent test results, reviewed documented beta blocker date and time   Airway        Dental   Pulmonary sleep apnea and Continuous Positive Airway Pressure Ventilation , former smoker,           Cardiovascular hypertension, + CAD  + dysrhythmias (on eliquis) Atrial Fibrillation + pacemaker (for tachy/brady syndrome)   TTE 05/2017 Left ventricle: ejection fraction was in the range of 55% to 60%. Wall motion was normal; there were no regional wall motion abnormalities. - Aortic valve: Right coronary cusp mobility was mildly restricted.There was mild to moderate regurgitation directed centrally in the LVOT. - Mitral valve: There was mild to moderate regurgitation directed centrally. - Left atrium: The atrium was moderately dilated. - Right ventricle: The cavity size was mildly dilated. Wall   thickness was normal. - Right atrium: The atrium was moderately dilated. - Pulmonary arteries: PA peak pressure: 31 mm Hg (S).   Neuro/Psych negative neurological ROS  negative psych ROS   GI/Hepatic Neg liver ROS, hiatal hernia, GERD  Medicated,  Endo/Other  negative endocrine ROS  Renal/GU negative Renal ROS  negative genitourinary   Musculoskeletal negative musculoskeletal ROS (+)   Abdominal   Peds  Hematology negative hematology ROS (+)   Anesthesia Other Findings   Reproductive/Obstetrics                             Anesthesia Physical Anesthesia Plan Anesthesia Quick Evaluation

## 2018-01-05 ENCOUNTER — Ambulatory Visit (HOSPITAL_COMMUNITY)
Admission: RE | Admit: 2018-01-05 | Discharge: 2018-01-05 | Disposition: A | Discharge status: Home / Self Care | Payer: PPO | Source: Ambulatory Visit | Attending: Cardiovascular Disease | Admitting: Cardiovascular Disease

## 2018-01-05 ENCOUNTER — Encounter (HOSPITAL_COMMUNITY)
Admission: RE | Disposition: A | Discharge status: Home / Self Care | Payer: Self-pay | Source: Ambulatory Visit | Attending: Cardiovascular Disease

## 2018-01-05 DIAGNOSIS — Z538 Procedure and treatment not carried out for other reasons: Secondary | ICD-10-CM | POA: Insufficient documentation

## 2018-01-05 DIAGNOSIS — Z7901 Long term (current) use of anticoagulants: Secondary | ICD-10-CM | POA: Diagnosis not present

## 2018-01-05 SURGERY — CANCELLED PROCEDURE

## 2018-01-05 NOTE — Progress Notes (Signed)
Upon patient arrival to Endoscopy and reviewing last medicine times, he informed us that he on 8/10 missed a morning dose of his Eliquis but has been taking it regularly since then. Paged MD Johnsie Cancel, he said since that missed dose he would not feel comfortable performing the cardioversion. Informed patient, to be discharged.

## 2018-01-16 ENCOUNTER — Telehealth: Payer: Self-pay

## 2018-01-16 DIAGNOSIS — E875 Hyperkalemia: Secondary | ICD-10-CM

## 2018-01-16 NOTE — Telephone Encounter (Signed)
Spoke with patient who agrees to have BMP redrawn tomorrow. He will check to see if he can have it drawn in the Afib clinic, if not, he will come to our lab. Pt had no additional questions.

## 2018-01-16 NOTE — Telephone Encounter (Signed)
-----   Message from Deboraha Sprang, MD sent at 01/12/2018  5:55 PM EDT ----- Please Inform Patient that labs are normal x K elevated  Please recheck   If it comes back elevated will ask renal, but would benefit from furosemide as a potassium waster   The other thing we saw the other day was whether he might be using a salt substitute Thanks

## 2018-01-17 ENCOUNTER — Ambulatory Visit (HOSPITAL_COMMUNITY)
Admission: RE | Admit: 2018-01-17 | Discharge: 2018-01-17 | Disposition: A | Discharge status: Home / Self Care | Payer: PPO | Source: Ambulatory Visit | Attending: Nurse Practitioner | Admitting: Nurse Practitioner

## 2018-01-17 ENCOUNTER — Encounter (HOSPITAL_COMMUNITY): Payer: Self-pay | Admitting: Nurse Practitioner

## 2018-01-17 VITALS — BP 134/84 | HR 133 | Ht 69.0 in | Wt 228.6 lb

## 2018-01-17 DIAGNOSIS — Z9889 Other specified postprocedural states: Secondary | ICD-10-CM | POA: Diagnosis not present

## 2018-01-17 DIAGNOSIS — I429 Cardiomyopathy, unspecified: Secondary | ICD-10-CM | POA: Insufficient documentation

## 2018-01-17 DIAGNOSIS — E669 Obesity, unspecified: Secondary | ICD-10-CM | POA: Diagnosis not present

## 2018-01-17 DIAGNOSIS — Z87891 Personal history of nicotine dependence: Secondary | ICD-10-CM | POA: Diagnosis not present

## 2018-01-17 DIAGNOSIS — Z79899 Other long term (current) drug therapy: Secondary | ICD-10-CM | POA: Diagnosis not present

## 2018-01-17 DIAGNOSIS — E785 Hyperlipidemia, unspecified: Secondary | ICD-10-CM | POA: Diagnosis not present

## 2018-01-17 DIAGNOSIS — I484 Atypical atrial flutter: Secondary | ICD-10-CM | POA: Diagnosis not present

## 2018-01-17 DIAGNOSIS — K219 Gastro-esophageal reflux disease without esophagitis: Secondary | ICD-10-CM | POA: Insufficient documentation

## 2018-01-17 DIAGNOSIS — R000 Tachycardia, unspecified: Secondary | ICD-10-CM | POA: Insufficient documentation

## 2018-01-17 DIAGNOSIS — K449 Diaphragmatic hernia without obstruction or gangrene: Secondary | ICD-10-CM | POA: Insufficient documentation

## 2018-01-17 DIAGNOSIS — I4892 Unspecified atrial flutter: Secondary | ICD-10-CM | POA: Diagnosis not present

## 2018-01-17 DIAGNOSIS — I4891 Unspecified atrial fibrillation: Secondary | ICD-10-CM | POA: Insufficient documentation

## 2018-01-17 DIAGNOSIS — Z7289 Other problems related to lifestyle: Secondary | ICD-10-CM | POA: Insufficient documentation

## 2018-01-17 DIAGNOSIS — G4733 Obstructive sleep apnea (adult) (pediatric): Secondary | ICD-10-CM | POA: Insufficient documentation

## 2018-01-17 DIAGNOSIS — Z7901 Long term (current) use of anticoagulants: Secondary | ICD-10-CM | POA: Diagnosis not present

## 2018-01-17 DIAGNOSIS — Z85828 Personal history of other malignant neoplasm of skin: Secondary | ICD-10-CM | POA: Insufficient documentation

## 2018-01-17 DIAGNOSIS — I10 Essential (primary) hypertension: Secondary | ICD-10-CM | POA: Insufficient documentation

## 2018-01-17 DIAGNOSIS — I480 Paroxysmal atrial fibrillation: Secondary | ICD-10-CM | POA: Diagnosis not present

## 2018-01-17 LAB — CBC
HEMATOCRIT: 42.3 % (ref 39.0–52.0)
Hemoglobin: 14.3 g/dL (ref 13.0–17.0)
MCH: 33.2 pg (ref 26.0–34.0)
MCHC: 33.8 g/dL (ref 30.0–36.0)
MCV: 98.1 fL (ref 78.0–100.0)
PLATELETS: 180 10*3/uL (ref 150–400)
RBC: 4.31 MIL/uL (ref 4.22–5.81)
RDW: 13.3 % (ref 11.5–15.5)
WBC: 8.8 10*3/uL (ref 4.0–10.5)

## 2018-01-17 LAB — BASIC METABOLIC PANEL
Anion gap: 8 (ref 5–15)
BUN: 18 mg/dL (ref 8–23)
CALCIUM: 9.4 mg/dL (ref 8.9–10.3)
CO2: 25 mmol/L (ref 22–32)
Chloride: 107 mmol/L (ref 98–111)
Creatinine, Ser: 1.17 mg/dL (ref 0.61–1.24)
GFR calc Af Amer: >60 mL/min (ref >60)
GFR, EST NON AFRICAN AMERICAN: 56 mL/min — AB (ref >60)
GLUCOSE: 106 mg/dL — AB (ref 70–99)
Potassium: 4.5 mmol/L (ref 3.5–5.1)
Sodium: 140 mmol/L (ref 135–145)

## 2018-01-17 NOTE — H&P (View-Only) (Signed)
Primary Care Physician: Janith Lima, MD Referring Physician: Dr. Jerrell Belfast Donald [name] is a 84 y.o. male with a h/o atrial flutter,afib, PPM, for tachy/brady syndrome, s/p ablation, OSA on cpap, alcohol use, cardiomyopathy, that was set up for cardioversion by Dr. Caryl Comes, but reported to the endo group that he had missed a dose of Eliquis, so DCCV was cancelled. He is now back in the afib clinic to get set up for cardioversion. He will be eligible for cardioversion the first of the week, not have missing any anticoagulation x 3 weeks. He remains in atrial flutter with RVR.  Today, he denies symptoms of palpitations, chest pain, shortness of breath, orthopnea, PND, lower extremity edema, dizziness, presyncope, syncope, or neurologic sequela. The patient is tolerating medications without difficulties and is otherwise without complaint today.   Past Medical History:  Diagnosis Date  . Abnormal CT scan, stomach 02/2014   Thickening of gastric fundus and cardia.  gastritis on EGD 03/2014  . Allergic rhinitis due to pollen   . Arthropathy, unspecified, site unspecified   . Atherosclerosis    a. Noted by abdominal CT 02/2014 (h/o normal nuc 2011).  . Atrial flutter ([place]) 02/24/14   s/p ablation 11/15  . Cardiomyopathy ([place])    tachy mediated - a. TEE (10/15):  EF 30%;  b. Echo after NSR restored (10/15):  mild LVH, EF 55-60%, mild AS, mild AI, mild MR, mild to mod LAE, mild RAE  . Diverticulosis    severe in descending, sigmoid colon.   . ED (erectile dysfunction)   . Esophageal stricture 03/2014   traversable with endoscope. not dilated.   . Gastric AVM   . GERD (gastroesophageal reflux disease) 03/2014   small HH and gastritis on EGD  . GI bleed   . Habitual alcohol use   . Hemorrhoids   . Hiatal hernia   . Hyperlipidemia   . Hypertension   . Obesity   . OSA on CPAP   . PAF (paroxysmal atrial fibrillation) ([place])   . Prostatitis, unspecified   . Sinus bradycardia    a. s/p  STJ dual chamber PPM  . Skin cancer    basal and squamous cell   Past Surgical History:  Procedure Laterality Date  . ATRIAL FLUTTER ABLATION N/A 03/19/2014   RFCA of atrial flutter by Dr Caryl Comes  . CARDIOVERSION N/A 02/24/2014   Procedure: CARDIOVERSION;  Surgeon: Lelon Perla, MD;  Location: [place];  Service: Cardiovascular;  Laterality: N/A;  . CARDIOVERSION Right 09/10/2014   Procedure: CARDIOVERSION;  Surgeon: Deboraha Sprang, MD;  Location: [facility] CATH LAB;  Service: Cardiovascular;  Laterality: Right;  . COLONOSCOPY N/A 04/23/2015   Procedure: COLONOSCOPY;  Surgeon: Ladene Artist, MD;  Location: [facility] ENDOSCOPY;  Service: Endoscopy;  Laterality: N/A;  . ENTEROSCOPY N/A 06/12/2015   Procedure: ENTEROSCOPY;  Surgeon: Ladene Artist, MD;  Location: [facility] ENDOSCOPY;  Service: Endoscopy;  Laterality: N/A;  . EP IMPLANTABLE DEVICE N/A 02/15/2016   Procedure: Pacemaker Implant;  Surgeon: Evans Lance, MD;  Location: [place] CV LAB;  Service: Cardiovascular;  Laterality: N/A;  . ESOPHAGOGASTRODUODENOSCOPY N/A 04/09/2014   Procedure: ESOPHAGOGASTRODUODENOSCOPY (EGD);  Surgeon: Inda Castle, MD;  Location: [place];  Service: Endoscopy;  Laterality: N/A;  . ESOPHAGOGASTRODUODENOSCOPY N/A 04/23/2015   Procedure: ESOPHAGOGASTRODUODENOSCOPY (EGD);  Surgeon: Ladene Artist, MD;  Location: [facility] ENDOSCOPY;  Service: Endoscopy;  Laterality: N/A;  . INGUINAL HERNIA REPAIR     right  . INGUINAL  HERNIA REPAIR     left  . LEFT HEART CATH AND CORONARY ANGIOGRAPHY N/A 03/16/2017   Procedure: LEFT HEART CATH AND CORONARY ANGIOGRAPHY;  Surgeon: Burnell Blanks, MD;  Location: [place] CV LAB;  Service: Cardiovascular;  Laterality: N/A;  . ORIF fracture of the elbow    . SKIN CANCER EXCISION  05/21/12   Squamous cell ca  . TEE WITHOUT CARDIOVERSION N/A 02/24/2014   Procedure: TRANSESOPHAGEAL ECHOCARDIOGRAM (TEE);  Surgeon: Lelon Perla, MD;  Location: [facility] ENDOSCOPY;  Service:  Cardiovascular;  Laterality: N/A;  . TONSILLECTOMY      Current Outpatient Medications  Medication Sig Dispense Refill  . apixaban (ELIQUIS) 5 MG TABS tablet Take 5 mg by mouth 2 (two) times daily.    Marland Kitchen atorvastatin (LIPITOR) 40 MG tablet TAKE 1 TABLET ONCE DAILY. (Patient taking differently: Take 40 mg by mouth daily. ) 90 tablet 0  . diltiazem (CARDIZEM CD) 120 MG 24 hr capsule Take 1 capsule (120 mg total) by mouth daily. (Patient taking differently: Take 120 mg by mouth 2 (two) times daily. ) 90 capsule 3  . ferrous sulfate 325 (65 FE) MG tablet Take 1 tablet (325 mg total) by mouth daily with breakfast. 60 tablet 3  . fluticasone (FLONASE) 50 MCG/ACT nasal spray 1 spray in each nostril daily as needed for stuffiness 16 g 11  . furosemide (LASIX) 20 MG tablet Take 20 mg by mouth daily.    . isosorbide mononitrate (IMDUR) 30 MG 24 hr tablet Take 1 tablet (30 mg total) by mouth daily. 30 tablet 3  . loratadine (CLARITIN) 10 MG tablet Take 10 mg by mouth daily as needed for allergies.     Marland Kitchen MAGNESIUM PO Take 6 tablets by mouth at bedtime.     . metoprolol tartrate (LOPRESSOR) 50 MG tablet Take 50 mg by mouth daily.    . metroNIDAZOLE (METROGEL) 0.75 % gel Apply 1 application topically daily as needed (rosacea).    . Multiple Vitamins-Minerals (MULTIVITAMIN PO) Take 1 tablet by mouth 2 (two) times daily.    . nitroGLYCERIN (NITROSTAT) 0.4 MG SL tablet PLACE 1 TABLET UNDER THE TONGUE EVERY 5 MINUTES AS NEEDED FOR CHEST PAIN, MAY REPEAT FOR 3 DOSES. (Patient taking differently: Place 0.4 mg under the tongue every 5 (five) minutes as needed for chest pain. ) 25 tablet 5  . pantoprazole (PROTONIX) 40 MG tablet Take 1 tablet (40 mg total) by mouth daily. MUST KEEP 02/15/18 APPT FOR FURTHER REFILLS 90 tablet 0   No current facility-administered medications for this encounter.     No Known Allergies  Social History   Socioeconomic History  . Marital status: Married    Spouse name: Not on file    . Number of children: 3  . Years of education: 70  . Highest education level: Not on file  Occupational History  . Occupation: Therapist, sports: RETIRED  Social Needs  . Financial resource strain: Not on file  . Food insecurity:    Worry: Not on file    Inability: Not on file  . Transportation needs:    Medical: Not on file    Non-medical: Not on file  Tobacco Use  . Smoking status: Former Smoker    Packs/day: 0.00    Years: 40.00    Pack years: 0.00    Types: Cigarettes, Cigars    Last attempt to quit: 09/18/2008    Years since quitting: 9.3  . Smokeless tobacco: Former Systems developer  Types: Chew  . Tobacco comment: Has Occasional Cigar/ quit   Substance and Sexual Activity  . Alcohol use: Yes    Alcohol/week: 28.0 standard drinks    Types: 21 Glasses of wine, 7 Shots of liquor per week  . Drug use: No  . Sexual activity: Not on file  Lifestyle  . Physical activity:    Days per week: Not on file    Minutes per session: Not on file  . Stress: Not on file  Relationships  . Social connections:    Talks on phone: Not on file    Gets together: Not on file    Attends religious service: Not on file    Active member of club or organization: Not on file    Attends meetings of clubs or organizations: Not on file    Relationship status: Not on file  . Intimate partner violence:    Fear of current or ex partner: Not on file    Emotionally abused: Not on file    Physically abused: Not on file    Forced sexual activity: Not on file  Other Topics Concern  . 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 married 1961. 2 sons- '64, '63 , I daughter- '66, 4 grandchildren. work: Museum/gallery curator, retired but still consults. 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      No new stressors/     Family History  Problem Relation Age of Onset  . Cancer Mother         ovarian  . Other Father        renal disease- grief over loss of spouse  . Cancer Brother        prostate- died of hematologic disorder 2nd to chemo  . Pulmonary fibrosis Brother   . Diabetes Neg Hx   . Coronary artery disease Neg Hx   . Colon cancer Neg Hx   . Stomach cancer Neg Hx   . Rectal cancer Neg Hx     ROS- All systems are reviewed and negative except as per the HPI above  Physical Exam: Vitals:   01/17/18 1020  BP: 134/84  Pulse: (!) 133  Weight: 103.7 kg  Height: 5\' 9"  (1.753 m)   Wt Readings from Last 3 Encounters:  01/17/18 103.7 kg  01/02/18 101.2 kg  06/20/17 100.2 kg    Labs: Lab Results  Component Value Date   NA 143 01/02/2018   K 5.5 (H) 01/02/2018   CL 104 01/02/2018   CO2 24 01/02/2018   GLUCOSE 101 (H) 01/02/2018   BUN 23 01/02/2018   CREATININE 1.26 01/02/2018   CALCIUM 9.8 01/02/2018   PHOS 2.7 02/14/2016   MG 2.0 02/14/2016   Lab Results  Component Value Date   INR 1.52 06/08/2017   Lab Results  Component Value Date   CHOL 107 06/08/2017   HDL 40.80 06/08/2017   LDLCALC 49 06/08/2017   TRIG 85.0 06/08/2017     GEN- The patient is well appearing, alert and oriented x 3 today.   Head- normocephalic, atraumatic Eyes-  Sclera clear, conjunctiva pink Ears- hearing intact Oropharynx- clear Neck- supple, no JVP Lymph- no cervical lymphadenopathy Lungs- Clear to ausculation bilaterally, normal work of breathing Heart- Rapid regular rate and rhythm, no murmurs, rubs or gallops, PMI not laterally displaced GI- soft, NT, ND, + BS Extremities- no clubbing, cyanosis, or edema  MS- no significant deformity or atrophy Skin- no rash or lesion Psych- euthymic mood, full affect Neuro- strength and sensation are intact  EKG-atrial flutter at 133 bpm, pr int 128 ms, qrs int 86 ms, qtc 461 ms    Assessment and Plan: 1. Atrial fib/ flutter Pt now has had uninterrupted anticoagulation x 3 weeks.  Continue eliquis 5 mg bid for  CHA2DS2VASc score of 5 and reminded not to miss doses He will be set up for cardioversion Bmet/cbc He was advised to decrease alcohol use to help discourage return of afib/flutter Continue to use Cpap to treat  OSA  2. PPM Per Dr. Caryl Comes and device clinc  3. HTN  Stable   F/u in afib clinic in one week  Butch Penny C. Nickalous Stingley, [facility] [address] [place], [place] [address] [phone]

## 2018-01-17 NOTE — Patient Instructions (Addendum)
Cardioversion scheduled for Monday, September 9th  - Arrive at the [facility] and go to admitting at [facility] not eat or drink anything after midnight the night prior to your procedure.  - Take all your morning medication with a sip of water prior to arrival.  - Do NOT miss any doses of Eliquis.  - You will not be able to drive home after your procedure.

## 2018-01-17 NOTE — Progress Notes (Signed)
Primary Care Physician: Janith Lima, MD Referring Physician: Dr. Jerrell Belfast Donald [name] is a 84 y.o. male with a h/o atrial flutter,afib, PPM, for tachy/brady syndrome, s/p ablation, OSA on cpap, alcohol use, cardiomyopathy, that was set up for cardioversion by Dr. Caryl Comes, but reported to the endo group that he had missed a dose of Eliquis, so DCCV was cancelled. He is now back in the afib clinic to get set up for cardioversion. He will be eligible for cardioversion the first of the week, not have missing any anticoagulation x 3 weeks. He remains in atrial flutter with RVR.  Today, he denies symptoms of palpitations, chest pain, shortness of breath, orthopnea, PND, lower extremity edema, dizziness, presyncope, syncope, or neurologic sequela. The patient is tolerating medications without difficulties and is otherwise without complaint today.   Past Medical History:  Diagnosis Date  . Abnormal CT scan, stomach 02/2014   Thickening of gastric fundus and cardia.  gastritis on EGD 03/2014  . Allergic rhinitis due to pollen   . Arthropathy, unspecified, site unspecified   . Atherosclerosis    a. Noted by abdominal CT 02/2014 (h/o normal nuc 2011).  . Atrial flutter ([place]) 02/24/14   s/p ablation 11/15  . Cardiomyopathy ([place])    tachy mediated - a. TEE (10/15):  EF 30%;  b. Echo after NSR restored (10/15):  mild LVH, EF 55-60%, mild AS, mild AI, mild MR, mild to mod LAE, mild RAE  . Diverticulosis    severe in descending, sigmoid colon.   . ED (erectile dysfunction)   . Esophageal stricture 03/2014   traversable with endoscope. not dilated.   . Gastric AVM   . GERD (gastroesophageal reflux disease) 03/2014   small HH and gastritis on EGD  . GI bleed   . Habitual alcohol use   . Hemorrhoids   . Hiatal hernia   . Hyperlipidemia   . Hypertension   . Obesity   . OSA on CPAP   . PAF (paroxysmal atrial fibrillation) ([place])   . Prostatitis, unspecified   . Sinus bradycardia    a. s/p  STJ dual chamber PPM  . Skin cancer    basal and squamous cell   Past Surgical History:  Procedure Laterality Date  . ATRIAL FLUTTER ABLATION N/A 03/19/2014   RFCA of atrial flutter by Dr Caryl Comes  . CARDIOVERSION N/A 02/24/2014   Procedure: CARDIOVERSION;  Surgeon: Lelon Perla, MD;  Location: [place];  Service: Cardiovascular;  Laterality: N/A;  . CARDIOVERSION Right 09/10/2014   Procedure: CARDIOVERSION;  Surgeon: Deboraha Sprang, MD;  Location: [facility] CATH LAB;  Service: Cardiovascular;  Laterality: Right;  . COLONOSCOPY N/A 04/23/2015   Procedure: COLONOSCOPY;  Surgeon: Ladene Artist, MD;  Location: [facility] ENDOSCOPY;  Service: Endoscopy;  Laterality: N/A;  . ENTEROSCOPY N/A 06/12/2015   Procedure: ENTEROSCOPY;  Surgeon: Ladene Artist, MD;  Location: [facility] ENDOSCOPY;  Service: Endoscopy;  Laterality: N/A;  . EP IMPLANTABLE DEVICE N/A 02/15/2016   Procedure: Pacemaker Implant;  Surgeon: Evans Lance, MD;  Location: [place] CV LAB;  Service: Cardiovascular;  Laterality: N/A;  . ESOPHAGOGASTRODUODENOSCOPY N/A 04/09/2014   Procedure: ESOPHAGOGASTRODUODENOSCOPY (EGD);  Surgeon: Inda Castle, MD;  Location: [place];  Service: Endoscopy;  Laterality: N/A;  . ESOPHAGOGASTRODUODENOSCOPY N/A 04/23/2015   Procedure: ESOPHAGOGASTRODUODENOSCOPY (EGD);  Surgeon: Ladene Artist, MD;  Location: [facility] ENDOSCOPY;  Service: Endoscopy;  Laterality: N/A;  . INGUINAL HERNIA REPAIR     right  . INGUINAL  HERNIA REPAIR     left  . LEFT HEART CATH AND CORONARY ANGIOGRAPHY N/A 03/16/2017   Procedure: LEFT HEART CATH AND CORONARY ANGIOGRAPHY;  Surgeon: Burnell Blanks, MD;  Location: [place] CV LAB;  Service: Cardiovascular;  Laterality: N/A;  . ORIF fracture of the elbow    . SKIN CANCER EXCISION  05/21/12   Squamous cell ca  . TEE WITHOUT CARDIOVERSION N/A 02/24/2014   Procedure: TRANSESOPHAGEAL ECHOCARDIOGRAM (TEE);  Surgeon: Lelon Perla, MD;  Location: [facility] ENDOSCOPY;  Service:  Cardiovascular;  Laterality: N/A;  . TONSILLECTOMY      Current Outpatient Medications  Medication Sig Dispense Refill  . apixaban (ELIQUIS) 5 MG TABS tablet Take 5 mg by mouth 2 (two) times daily.    Marland Kitchen atorvastatin (LIPITOR) 40 MG tablet TAKE 1 TABLET ONCE DAILY. (Patient taking differently: Take 40 mg by mouth daily. ) 90 tablet 0  . diltiazem (CARDIZEM CD) 120 MG 24 hr capsule Take 1 capsule (120 mg total) by mouth daily. (Patient taking differently: Take 120 mg by mouth 2 (two) times daily. ) 90 capsule 3  . ferrous sulfate 325 (65 FE) MG tablet Take 1 tablet (325 mg total) by mouth daily with breakfast. 60 tablet 3  . fluticasone (FLONASE) 50 MCG/ACT nasal spray 1 spray in each nostril daily as needed for stuffiness 16 g 11  . furosemide (LASIX) 20 MG tablet Take 20 mg by mouth daily.    . isosorbide mononitrate (IMDUR) 30 MG 24 hr tablet Take 1 tablet (30 mg total) by mouth daily. 30 tablet 3  . loratadine (CLARITIN) 10 MG tablet Take 10 mg by mouth daily as needed for allergies.     Marland Kitchen MAGNESIUM PO Take 6 tablets by mouth at bedtime.     . metoprolol tartrate (LOPRESSOR) 50 MG tablet Take 50 mg by mouth daily.    . metroNIDAZOLE (METROGEL) 0.75 % gel Apply 1 application topically daily as needed (rosacea).    . Multiple Vitamins-Minerals (MULTIVITAMIN PO) Take 1 tablet by mouth 2 (two) times daily.    . nitroGLYCERIN (NITROSTAT) 0.4 MG SL tablet PLACE 1 TABLET UNDER THE TONGUE EVERY 5 MINUTES AS NEEDED FOR CHEST PAIN, MAY REPEAT FOR 3 DOSES. (Patient taking differently: Place 0.4 mg under the tongue every 5 (five) minutes as needed for chest pain. ) 25 tablet 5  . pantoprazole (PROTONIX) 40 MG tablet Take 1 tablet (40 mg total) by mouth daily. MUST KEEP 02/15/18 APPT FOR FURTHER REFILLS 90 tablet 0   No current facility-administered medications for this encounter.     No Known Allergies  Social History   Socioeconomic History  . Marital status: Married    Spouse name: Not on file    . Number of children: 3  . Years of education: 33  . Highest education level: Not on file  Occupational History  . Occupation: Therapist, sports: RETIRED  Social Needs  . Financial resource strain: Not on file  . Food insecurity:    Worry: Not on file    Inability: Not on file  . Transportation needs:    Medical: Not on file    Non-medical: Not on file  Tobacco Use  . Smoking status: Former Smoker    Packs/day: 0.00    Years: 40.00    Pack years: 0.00    Types: Cigarettes, Cigars    Last attempt to quit: 09/18/2008    Years since quitting: 9.3  . Smokeless tobacco: Former Systems developer  Types: Chew  . Tobacco comment: Has Occasional Cigar/ quit   Substance and Sexual Activity  . Alcohol use: Yes    Alcohol/week: 28.0 standard drinks    Types: 21 Glasses of wine, 7 Shots of liquor per week  . Drug use: No  . Sexual activity: Not on file  Lifestyle  . Physical activity:    Days per week: Not on file    Minutes per session: Not on file  . Stress: Not on file  Relationships  . Social connections:    Talks on phone: Not on file    Gets together: Not on file    Attends religious service: Not on file    Active member of club or organization: Not on file    Attends meetings of clubs or organizations: Not on file    Relationship status: Not on file  . Intimate partner violence:    Fear of current or ex partner: Not on file    Emotionally abused: Not on file    Physically abused: Not on file    Forced sexual activity: Not on file  Other Topics Concern  . 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 married 1961. 2 sons- '64, '63 , I daughter- '66, 4 grandchildren. work: Museum/gallery curator, retired but still consults. 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      No new stressors/     Family History  Problem Relation Age of Onset  . Cancer Mother         ovarian  . Other Father        renal disease- grief over loss of spouse  . Cancer Brother        prostate- died of hematologic disorder 2nd to chemo  . Pulmonary fibrosis Brother   . Diabetes Neg Hx   . Coronary artery disease Neg Hx   . Colon cancer Neg Hx   . Stomach cancer Neg Hx   . Rectal cancer Neg Hx     ROS- All systems are reviewed and negative except as per the HPI above  Physical Exam: Vitals:   01/17/18 1020  BP: 134/84  Pulse: (!) 133  Weight: 103.7 kg  Height: 5\' 9"  (1.753 m)   Wt Readings from Last 3 Encounters:  01/17/18 103.7 kg  01/02/18 101.2 kg  06/20/17 100.2 kg    Labs: Lab Results  Component Value Date   NA 143 01/02/2018   K 5.5 (H) 01/02/2018   CL 104 01/02/2018   CO2 24 01/02/2018   GLUCOSE 101 (H) 01/02/2018   BUN 23 01/02/2018   CREATININE 1.26 01/02/2018   CALCIUM 9.8 01/02/2018   PHOS 2.7 02/14/2016   MG 2.0 02/14/2016   Lab Results  Component Value Date   INR 1.52 06/08/2017   Lab Results  Component Value Date   CHOL 107 06/08/2017   HDL 40.80 06/08/2017   LDLCALC 49 06/08/2017   TRIG 85.0 06/08/2017     GEN- The patient is well appearing, alert and oriented x 3 today.   Head- normocephalic, atraumatic Eyes-  Sclera clear, conjunctiva pink Ears- hearing intact Oropharynx- clear Neck- supple, no JVP Lymph- no cervical lymphadenopathy Lungs- Clear to ausculation bilaterally, normal work of breathing Heart- Rapid regular rate and rhythm, no murmurs, rubs or gallops, PMI not laterally displaced GI- soft, NT, ND, + BS Extremities- no clubbing, cyanosis, or edema  MS- no significant deformity or atrophy Skin- no rash or lesion Psych- euthymic mood, full affect Neuro- strength and sensation are intact  EKG-atrial flutter at 133 bpm, pr int 128 ms, qrs int 86 ms, qtc 461 ms    Assessment and Plan: 1. Atrial fib/ flutter Pt now has had uninterrupted anticoagulation x 3 weeks.  Continue eliquis 5 mg bid for  [id number] score of 5 and reminded not to miss doses He will be set up for cardioversion Bmet/cbc He was advised to decrease alcohol use to help discourage return of afib/flutter Continue to use Cpap to treat  OSA  2. PPM Per Dr. Caryl Comes and device clinc  3. HTN  Stable   F/u in afib clinic in one week  Butch Penny C. Carroll, [facility] [address] [place], [place] [address] [phone]

## 2018-01-22 ENCOUNTER — Encounter (HOSPITAL_COMMUNITY)
Admission: RE | Disposition: A | Discharge status: Home / Self Care | Payer: Self-pay | Source: Ambulatory Visit | Attending: Cardiology

## 2018-01-22 ENCOUNTER — Ambulatory Visit (HOSPITAL_COMMUNITY): Payer: PPO | Admitting: Anesthesiology

## 2018-01-22 ENCOUNTER — Other Ambulatory Visit: Payer: Self-pay

## 2018-01-22 ENCOUNTER — Encounter (HOSPITAL_COMMUNITY): Payer: Self-pay | Admitting: *Deleted

## 2018-01-22 ENCOUNTER — Ambulatory Visit (HOSPITAL_COMMUNITY)
Admission: RE | Admit: 2018-01-22 | Discharge: 2018-01-22 | Disposition: A | Discharge status: Home / Self Care | Payer: PPO | Source: Ambulatory Visit | Attending: Cardiology | Admitting: Cardiology

## 2018-01-22 DIAGNOSIS — I429 Cardiomyopathy, unspecified: Secondary | ICD-10-CM | POA: Diagnosis not present

## 2018-01-22 DIAGNOSIS — K219 Gastro-esophageal reflux disease without esophagitis: Secondary | ICD-10-CM | POA: Insufficient documentation

## 2018-01-22 DIAGNOSIS — I739 Peripheral vascular disease, unspecified: Secondary | ICD-10-CM | POA: Diagnosis not present

## 2018-01-22 DIAGNOSIS — Z7951 Long term (current) use of inhaled steroids: Secondary | ICD-10-CM | POA: Insufficient documentation

## 2018-01-22 DIAGNOSIS — E785 Hyperlipidemia, unspecified: Secondary | ICD-10-CM | POA: Diagnosis not present

## 2018-01-22 DIAGNOSIS — I4892 Unspecified atrial flutter: Secondary | ICD-10-CM | POA: Diagnosis not present

## 2018-01-22 DIAGNOSIS — I4891 Unspecified atrial fibrillation: Secondary | ICD-10-CM | POA: Diagnosis not present

## 2018-01-22 DIAGNOSIS — G4733 Obstructive sleep apnea (adult) (pediatric): Secondary | ICD-10-CM | POA: Insufficient documentation

## 2018-01-22 DIAGNOSIS — Z87891 Personal history of nicotine dependence: Secondary | ICD-10-CM | POA: Diagnosis not present

## 2018-01-22 DIAGNOSIS — K5730 Diverticulosis of large intestine without perforation or abscess without bleeding: Secondary | ICD-10-CM | POA: Diagnosis not present

## 2018-01-22 DIAGNOSIS — Z6832 Body mass index (BMI) 32.0-32.9, adult: Secondary | ICD-10-CM | POA: Diagnosis not present

## 2018-01-22 DIAGNOSIS — I2510 Atherosclerotic heart disease of native coronary artery without angina pectoris: Secondary | ICD-10-CM | POA: Diagnosis not present

## 2018-01-22 DIAGNOSIS — I480 Paroxysmal atrial fibrillation: Secondary | ICD-10-CM | POA: Diagnosis not present

## 2018-01-22 DIAGNOSIS — I10 Essential (primary) hypertension: Secondary | ICD-10-CM | POA: Insufficient documentation

## 2018-01-22 DIAGNOSIS — E669 Obesity, unspecified: Secondary | ICD-10-CM | POA: Insufficient documentation

## 2018-01-22 DIAGNOSIS — Z950 Presence of cardiac pacemaker: Secondary | ICD-10-CM | POA: Diagnosis not present

## 2018-01-22 DIAGNOSIS — Z7901 Long term (current) use of anticoagulants: Secondary | ICD-10-CM | POA: Insufficient documentation

## 2018-01-22 HISTORY — PX: CARDIOVERSION: SHX1299

## 2018-01-22 SURGERY — CARDIOVERSION
Anesthesia: General

## 2018-01-22 MED ORDER — SODIUM CHLORIDE 0.9 % IV SOLN
INTRAVENOUS | Status: DC
  Administered 2018-01-22: 08:00:00 via INTRAVENOUS

## 2018-01-22 MED ORDER — SODIUM CHLORIDE 0.9 % IV SOLN
INTRAVENOUS | Status: DC | PRN
  Administered 2018-01-22: 08:00:00 via INTRAVENOUS

## 2018-01-22 MED ORDER — LIDOCAINE 2% (20 MG/ML) 5 ML SYRINGE
INTRAMUSCULAR | Status: DC | PRN
  Administered 2018-01-22: 50 mg via INTRAVENOUS

## 2018-01-22 MED ORDER — PROPOFOL 10 MG/ML IV BOLUS
INTRAVENOUS | Status: DC | PRN
  Administered 2018-01-22: 20 mg via INTRAVENOUS
  Administered 2018-01-22: 60 mg via INTRAVENOUS

## 2018-01-22 NOTE — Discharge Instructions (Signed)
Electrical Cardioversion, Care After °This sheet gives you information about how to care for yourself after your procedure. Your health care provider may also give you more specific instructions. If you have problems or questions, contact your health care provider. °What can I expect after the procedure? °After the procedure, it is common to have: °· Some redness on the skin where the shocks were given. ° °Follow these instructions at home: °· Do not drive for 24 hours if you were given a medicine to help you relax (sedative). °· Take over-the-counter and prescription medicines only as told by your health care provider. °· Ask your health care provider how to check your pulse. Check it often. °· Rest for 48 hours after the procedure or as told by your health care provider. °· Avoid or limit your caffeine use as told by your health care provider. °Contact a health care provider if: °· You feel like your heart is beating too quickly or your pulse is not regular. °· You have a serious muscle cramp that does not go away. °Get help right away if: °· You have discomfort in your chest. °· You are dizzy or you feel faint. °· You have trouble breathing or you are short of breath. °· Your speech is slurred. °· You have trouble moving an arm or leg on one side of your body. °· Your fingers or toes turn cold or blue. °This information is not intended to replace advice given to you by your health care provider. Make sure you discuss any questions you have with your health care provider. °Document Released: 02/20/2013 Document Revised: 12/04/2015 Document Reviewed: 11/06/2015 °Elsevier Interactive Patient Education © 2018 [employer] Inc[employer] ° °

## 2018-01-22 NOTE — Interval H&P Note (Signed)
History and Physical Interval Note:  01/22/2018 7:43 AM  Donald [name]  has presented today for surgery, with the diagnosis of ATRIAL FIBRILLATION  The various methods of treatment have been discussed with the patient and family. After consideration of risks, benefits and other options for treatment, the patient has consented to  Procedure(s): CARDIOVERSION (N/A) as a surgical intervention .  The patient's history has been reviewed, patient examined, no change in status, stable for surgery.  I have reviewed the patient's chart and labs.  Questions were answered to the patient's satisfaction.     Dorris Carnes

## 2018-01-22 NOTE — Anesthesia Preprocedure Evaluation (Addendum)
Anesthesia Evaluation  Patient identified by MRN, date of birth, ID band Patient awake    Reviewed: Allergy & Precautions, NPO status , Patient's Chart, lab work & pertinent test results, reviewed documented beta blocker date and time   Airway Mallampati: II  TM Distance: >3 FB Neck ROM: Full    Dental no notable dental hx. (+) Teeth Intact   Pulmonary sleep apnea , former smoker,    Pulmonary exam normal breath sounds clear to auscultation       Cardiovascular hypertension, Pt. on medications and Pt. on home beta blockers + CAD and + Peripheral Vascular Disease  + pacemaker  Rhythm:Irregular Rate:Normal  EKG 01/17/2018- Sinus tachycardia  Echo 06/09/2017- Left ventricle: The cavity size was normal. Systolic function was normal. The estimated ejection fraction was in the range of 55% to 60%. Wall motion was normal; there were no regional wall motion abnormalities. There was a reduced contribution of atrial contraction to ventricular filling, due to atrial contractile dysfunction (e.g. stunning after recent atrial fibrillation).. Doppler parameters are consistent with elevated mean left atrial filling pressure. - Aortic valve: Right coronary cusp mobility was mildly restricted.There was mild to moderate regurgitation directed centrally in the LVOT. - Mitral valve: There was mild to moderate regurgitation directed centrally. - Left atrium: The atrium was moderately dilated. - Right ventricle: The cavity size was mildly dilated. Wall   thickness was normal. - Right atrium: The atrium was moderately dilated. - Pulmonary arteries: PA peak pressure: 31 mm Hg (S).    Neuro/Psych negative psych ROS   GI/Hepatic Neg liver ROS, hiatal hernia, GERD  Medicated and Controlled,(+)     substance abuse  alcohol use,   Endo/Other  Hhyperlipidemia  Renal/GU Renal disease   ED    Musculoskeletal negative musculoskeletal ROS (+)   Abdominal (+) + obese,   Peds  Hematology  (+) anemia , Chronic anticoagulant use   Anesthesia Other Findings   Reproductive/Obstetrics                            Anesthesia Physical Anesthesia Plan  ASA: III  Anesthesia Plan: General   Post-op Pain Management:    Induction: Intravenous  PONV Risk Score and Plan: 2 and Ondansetron, Propofol infusion and Treatment may vary due to age or medical condition  Airway Management Planned: Mask  Additional Equipment:   Intra-op Plan:   Post-operative Plan:   Informed Consent: I have reviewed the patients History and Physical, chart, labs and discussed the procedure including the risks, benefits and alternatives for the proposed anesthesia with the patient or authorized representative who has indicated his/her understanding and acceptance.   Dental advisory given  Plan Discussed with: CRNA and Surgeon  Anesthesia Plan Comments:         Anesthesia Quick Evaluation

## 2018-01-22 NOTE — Transfer of Care (Signed)
Immediate Anesthesia Transfer of Care Note  Patient: Donald [name]  Procedure(s) Performed: CARDIOVERSION (N/A )  Patient Location: Endoscopy Unit  Anesthesia Type:General  Level of Consciousness: awake, alert  and oriented  Airway & Oxygen Therapy: Patient Spontanous Breathing  Post-op Assessment: Report given to RN and Post -op Vital signs reviewed and stable  Post vital signs: Reviewed and stable  Last Vitals:  Vitals Value Taken Time  BP 90/66 01/22/2018  8:15 AM  Temp    Pulse 112 01/22/2018  8:18 AM  Resp 22 01/22/2018  8:18 AM  SpO2 97 % 01/22/2018  8:18 AM  Vitals shown include unvalidated device data.  Last Pain:  Vitals:   01/22/18 0810  TempSrc:   PainSc: 0-No pain         Complications: No apparent anesthesia complications

## 2018-01-22 NOTE — Anesthesia Postprocedure Evaluation (Signed)
Anesthesia Post Note  Patient: Donald [name]  Procedure(s) Performed: CARDIOVERSION (N/A )     Patient location during evaluation: PACU Anesthesia Type: General Level of consciousness: awake and alert and oriented Pain management: pain level controlled Vital Signs Assessment: post-procedure vital signs reviewed and stable Respiratory status: spontaneous breathing, nonlabored ventilation and respiratory function stable Cardiovascular status: blood pressure returned to baseline and stable Postop Assessment: no apparent nausea or vomiting Anesthetic complications: no    Last Vitals:  Vitals:   01/22/18 0810 01/22/18 0820  BP: 90/66 103/79  Pulse: (!) 111 (!) 113  Resp: 17 18  Temp:    SpO2: 96% 96%    Last Pain:  Vitals:   01/22/18 0820  TempSrc:   PainSc: 0-No pain                 Omarian Jaquith A.

## 2018-01-22 NOTE — Op Note (Addendum)
Cardioversion  Patient sedated by anesthesia with IV lidocaine and propofol  With pads in the AP position cardioversion attempted with 150 J synchronized biphasic energy.   COnverted to SR for about 6 beats then back to atrial flutter  Next, 200 J synchronized biphasic energy delivered.   Pt converted to Sinus bradycardia    He maintained this for a couple minutes and then reverted to atrial flutter  Procedure was without complication  Pacemaker interrogated by representative  Pt to maintain same meds   Discussed with Dr Caryl Comes who will be in touch with pt     REcomm he keep appt for next with in atrial fib clinic.  Donald [name]

## 2018-01-23 ENCOUNTER — Encounter (HOSPITAL_COMMUNITY): Payer: Self-pay | Admitting: Internal Medicine

## 2018-01-29 ENCOUNTER — Ambulatory Visit (HOSPITAL_COMMUNITY)
Admission: RE | Admit: 2018-01-29 | Discharge: 2018-01-29 | Disposition: A | Discharge status: Home / Self Care | Payer: PPO | Source: Ambulatory Visit | Attending: Nurse Practitioner | Admitting: Nurse Practitioner

## 2018-01-29 ENCOUNTER — Encounter (HOSPITAL_COMMUNITY): Payer: Self-pay | Admitting: Nurse Practitioner

## 2018-01-29 VITALS — BP 122/68 | HR 60 | Ht 69.0 in | Wt 225.0 lb

## 2018-01-29 DIAGNOSIS — R9431 Abnormal electrocardiogram [ECG] [EKG]: Secondary | ICD-10-CM | POA: Diagnosis not present

## 2018-01-29 DIAGNOSIS — E669 Obesity, unspecified: Secondary | ICD-10-CM | POA: Insufficient documentation

## 2018-01-29 DIAGNOSIS — I429 Cardiomyopathy, unspecified: Secondary | ICD-10-CM | POA: Insufficient documentation

## 2018-01-29 DIAGNOSIS — Z85828 Personal history of other malignant neoplasm of skin: Secondary | ICD-10-CM | POA: Diagnosis not present

## 2018-01-29 DIAGNOSIS — I484 Atypical atrial flutter: Secondary | ICD-10-CM

## 2018-01-29 DIAGNOSIS — I10 Essential (primary) hypertension: Secondary | ICD-10-CM | POA: Insufficient documentation

## 2018-01-29 DIAGNOSIS — K219 Gastro-esophageal reflux disease without esophagitis: Secondary | ICD-10-CM | POA: Diagnosis not present

## 2018-01-29 DIAGNOSIS — G4733 Obstructive sleep apnea (adult) (pediatric): Secondary | ICD-10-CM | POA: Diagnosis not present

## 2018-01-29 DIAGNOSIS — Z950 Presence of cardiac pacemaker: Secondary | ICD-10-CM | POA: Insufficient documentation

## 2018-01-29 DIAGNOSIS — Z79899 Other long term (current) drug therapy: Secondary | ICD-10-CM | POA: Diagnosis not present

## 2018-01-29 DIAGNOSIS — Z7901 Long term (current) use of anticoagulants: Secondary | ICD-10-CM | POA: Insufficient documentation

## 2018-01-29 DIAGNOSIS — E785 Hyperlipidemia, unspecified: Secondary | ICD-10-CM | POA: Insufficient documentation

## 2018-01-29 DIAGNOSIS — I4892 Unspecified atrial flutter: Secondary | ICD-10-CM | POA: Diagnosis not present

## 2018-01-29 DIAGNOSIS — K449 Diaphragmatic hernia without obstruction or gangrene: Secondary | ICD-10-CM | POA: Diagnosis not present

## 2018-01-29 DIAGNOSIS — Z87891 Personal history of nicotine dependence: Secondary | ICD-10-CM | POA: Diagnosis not present

## 2018-01-29 DIAGNOSIS — I480 Paroxysmal atrial fibrillation: Secondary | ICD-10-CM | POA: Diagnosis not present

## 2018-01-29 DIAGNOSIS — Z9889 Other specified postprocedural states: Secondary | ICD-10-CM | POA: Diagnosis not present

## 2018-01-29 DIAGNOSIS — I4891 Unspecified atrial fibrillation: Secondary | ICD-10-CM | POA: Diagnosis present

## 2018-01-29 MED ORDER — DILTIAZEM HCL ER COATED BEADS 120 MG PO CP24: 120.0000 mg | ORAL_CAPSULE | Freq: Two times a day (BID) | ORAL | 1 refills | Status: DC

## 2018-01-29 MED ORDER — ATORVASTATIN CALCIUM 40 MG PO TABS: ORAL_TABLET | ORAL | 0 refills | Status: DC

## 2018-01-29 NOTE — Patient Instructions (Signed)
START ATORVASTATIN IN THE EVENING  START METOPROLOL 25 MG TWICE A DAY.  SPLIT THE 50 MG TABLET YOU HAVE ON HAND NOW

## 2018-01-29 NOTE — Progress Notes (Signed)
Primary Care Physician: Janith Lima, MD Referring Physician: Dr. Jerrell Belfast Donald [name] is a 84 y.o. male with a h/o atrial flutter,afib, PPM, for tachy/brady syndrome, s/p ablation, OSA on cpap, alcohol use, cardiomyopathy, that was set up for cardioversion by Dr. Caryl Comes, but reported to the endo group that he had missed a dose of Eliquis, so DCCV was cancelled. He is now back in the afib clinic to get set up for cardioversion. He will be eligible for cardioversion the first of the week, not have missing any anticoagulation x 3 weeks. He remains in atrial flutter with RVR.  F/u from unsuccessful cardioversion, 9/9.He reports that his heart rate became regular and in the 60's 2 hours after cardioversion and he has been staying in SR since then. I discussed with Dr. Caryl Comes and instead of starting antiarrythmic, will see if pt continues in SR for now since he has not had frequent breakthrough episodes in the past. He feels improved.  Today, he denies symptoms of palpitations, chest pain, shortness of breath, orthopnea, PND, lower extremity edema, dizziness, presyncope, syncope, or neurologic sequela. The patient is tolerating medications without difficulties and is otherwise without complaint today.   Past Medical History:  Diagnosis Date  . Abnormal CT scan, stomach 02/2014   Thickening of gastric fundus and cardia.  gastritis on EGD 03/2014  . Allergic rhinitis due to pollen   . Arthropathy, unspecified, site unspecified   . Atherosclerosis    a. Noted by abdominal CT 02/2014 (h/o normal nuc 2011).  . Atrial flutter ([place]) 02/24/14   s/p ablation 11/15  . Cardiomyopathy ([place])    tachy mediated - a. TEE (10/15):  EF 30%;  b. Echo after NSR restored (10/15):  mild LVH, EF 55-60%, mild AS, mild AI, mild MR, mild to mod LAE, mild RAE  . Diverticulosis    severe in descending, sigmoid colon.   . ED (erectile dysfunction)   . Esophageal stricture 03/2014   traversable with endoscope. not  dilated.   . Gastric AVM   . GERD (gastroesophageal reflux disease) 03/2014   small HH and gastritis on EGD  . GI bleed   . Habitual alcohol use   . Hemorrhoids   . Hiatal hernia   . Hyperlipidemia   . Hypertension   . Obesity   . OSA on CPAP   . PAF (paroxysmal atrial fibrillation) ([place])   . Prostatitis, unspecified   . 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 Skin cancer    basal and squamous cell   Past Surgical History:  Procedure Laterality Date  . ATRIAL FLUTTER ABLATION N/A 03/19/2014   RFCA of atrial flutter by Dr Caryl Comes  . CARDIOVERSION N/A 02/24/2014   Procedure: CARDIOVERSION;  Surgeon: Lelon Perla, MD;  Location: [place];  Service: Cardiovascular;  Laterality: N/A;  . CARDIOVERSION Right 09/10/2014   Procedure: CARDIOVERSION;  Surgeon: Deboraha Sprang, MD;  Location: [facility] CATH LAB;  Service: Cardiovascular;  Laterality: Right;  . CARDIOVERSION N/A 01/22/2018   Procedure: CARDIOVERSION;  Surgeon: Fay Records, MD;  Location: [facility] ENDOSCOPY;  Service: Cardiovascular;  Laterality: N/A;  . COLONOSCOPY N/A 04/23/2015   Procedure: COLONOSCOPY;  Surgeon: Ladene Artist, MD;  Location: [facility] ENDOSCOPY;  Service: Endoscopy;  Laterality: N/A;  . ENTEROSCOPY N/A 06/12/2015   Procedure: ENTEROSCOPY;  Surgeon: Ladene Artist, MD;  Location: [facility] ENDOSCOPY;  Service: Endoscopy;  Laterality: N/A;  . EP IMPLANTABLE DEVICE N/A 02/15/2016  Procedure: Pacemaker Implant;  Surgeon: Evans Lance, MD;  Location: [place] CV LAB;  Service: Cardiovascular;  Laterality: N/A;  . ESOPHAGOGASTRODUODENOSCOPY N/A 04/09/2014   Procedure: ESOPHAGOGASTRODUODENOSCOPY (EGD);  Surgeon: Inda Castle, MD;  Location: [place];  Service: Endoscopy;  Laterality: N/A;  . ESOPHAGOGASTRODUODENOSCOPY N/A 04/23/2015   Procedure: ESOPHAGOGASTRODUODENOSCOPY (EGD);  Surgeon: Ladene Artist, MD;  Location: [facility] ENDOSCOPY;  Service: Endoscopy;  Laterality: N/A;  . INGUINAL HERNIA REPAIR     right    . INGUINAL HERNIA REPAIR     left  . LEFT HEART CATH AND CORONARY ANGIOGRAPHY N/A 03/16/2017   Procedure: LEFT HEART CATH AND CORONARY ANGIOGRAPHY;  Surgeon: Burnell Blanks, MD;  Location: [place] CV LAB;  Service: Cardiovascular;  Laterality: N/A;  . ORIF fracture of the elbow    . SKIN CANCER EXCISION  05/21/12   Squamous cell ca  . TEE WITHOUT CARDIOVERSION N/A 02/24/2014   Procedure: TRANSESOPHAGEAL ECHOCARDIOGRAM (TEE);  Surgeon: Lelon Perla, MD;  Location: [facility] ENDOSCOPY;  Service: Cardiovascular;  Laterality: N/A;  . TONSILLECTOMY      Current Outpatient Medications  Medication Sig Dispense Refill  . apixaban (ELIQUIS) 5 MG TABS tablet Take 5 mg by mouth 2 (two) times daily.    Marland Kitchen atorvastatin (LIPITOR) 40 MG tablet TAKE 1 TABLET ONCE EVERY EVENING 90 tablet 0  . diltiazem (CARDIZEM CD) 120 MG 24 hr capsule Take 1 capsule (120 mg total) by mouth 2 (two) times daily. 180 capsule 1  . ferrous sulfate 325 (65 FE) MG tablet Take 1 tablet (325 mg total) by mouth daily with breakfast. 60 tablet 3  . fluticasone (FLONASE) 50 MCG/ACT nasal spray 1 spray in each nostril daily as needed for stuffiness (Patient taking differently: Place 1 spray into both nostrils daily as needed for allergies. ) 16 g 11  . furosemide (LASIX) 20 MG tablet Take 20 mg by mouth daily.    . isosorbide mononitrate (IMDUR) 30 MG 24 hr tablet Take 1 tablet (30 mg total) by mouth daily. 30 tablet 3  . loratadine (CLARITIN) 10 MG tablet Take 10 mg by mouth daily as needed for allergies.     Marland Kitchen MAGNESIUM PO Take 6 tablets by mouth at bedtime.     . metoprolol tartrate (LOPRESSOR) 50 MG tablet Take 25 mg by mouth 2 (two) times daily.    . metroNIDAZOLE (METROGEL) 0.75 % gel Apply 1 application topically daily as needed (for rosacea).     . Multiple Vitamins-Minerals (MULTIVITAMIN PO) Take 1 tablet by mouth 2 (two) times daily.    . nitroGLYCERIN (NITROSTAT) 0.4 MG SL tablet PLACE 1 TABLET UNDER THE TONGUE  EVERY 5 MINUTES AS NEEDED FOR CHEST PAIN, MAY REPEAT FOR 3 DOSES. (Patient taking differently: Place 0.4 mg under the tongue every 5 (five) minutes as needed for chest pain. ) 25 tablet 5  . pantoprazole (PROTONIX) 40 MG tablet Take 1 tablet (40 mg total) by mouth daily. MUST KEEP 02/15/18 APPT FOR FURTHER REFILLS 90 tablet 0   No current facility-administered medications for this encounter.     No Known Allergies  Social History   Socioeconomic History  . Marital status: Married    Spouse name: Not on file  . Number of children: 3  . Years of education: 97  . Highest education level: Not on file  Occupational History  . Occupation: Therapist, sports: RETIRED  Social Needs  . Financial resource strain: Not on file  .  Food insecurity:    Worry: Not on file    Inability: Not on file  . Transportation needs:    Medical: Not on file    Non-medical: Not on file  Tobacco Use  . Smoking status: Former Smoker    Packs/day: 0.00    Years: 40.00    Pack years: 0.00    Types: Cigarettes, Cigars    Last attempt to quit: 09/18/2008    Years since quitting: 9.3  . Smokeless tobacco: Former Systems developer    Types: Chew  . Tobacco comment: Has Occasional Cigar/ quit   Substance and Sexual Activity  . Alcohol use: Yes    Alcohol/week: 28.0 standard drinks    Types: 21 Glasses of wine, 7 Shots of liquor per week  . Drug use: No  . Sexual activity: Not on file  Lifestyle  . Physical activity:    Days per week: Not on file    Minutes per session: Not on file  . Stress: Not on file  Relationships  . Social connections:    Talks on phone: Not on file    Gets together: Not on file    Attends religious service: Not on file    Active member of club or organization: Not on file    Attends meetings of clubs or organizations: Not on file    Relationship status: Not on file  . Intimate partner violence:    Fear of current or ex partner: Not on file    Emotionally abused: Not on file      Physically abused: Not on file    Forced sexual activity: Not on file  Other Topics Concern  . 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 married 1961. 2 sons- '64, '63 , I daughter- '66, 4 grandchildren. work: Museum/gallery curator, retired but still consults. 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      No new stressors/     Family History  Problem Relation Age of Onset  . Cancer Mother        ovarian  . Other Father        renal disease- grief over loss of spouse  . Cancer Brother        prostate- died of hematologic disorder 2nd to chemo  . Pulmonary fibrosis Brother   . Diabetes Neg Hx   . Coronary artery disease Neg Hx   . Colon cancer Neg Hx   . Stomach cancer Neg Hx   . Rectal cancer Neg Hx     ROS- All systems are reviewed and negative except as per the HPI above  Physical Exam: Vitals:   01/29/18 0917  BP: 122/68  Pulse: 60  Weight: 102.1 kg  Height: 5\' 9"  (1.753 m)   Wt Readings from Last 3 Encounters:  01/29/18 102.1 kg  01/22/18 101.2 kg  01/17/18 103.7 kg    Labs: Lab Results  Component Value Date   NA 140 01/17/2018   K 4.5 01/17/2018   CL 107 01/17/2018   CO2 25 01/17/2018   GLUCOSE 106 (H) 01/17/2018   BUN 18 01/17/2018   CREATININE 1.17 01/17/2018   CALCIUM 9.4 01/17/2018   PHOS 2.7 02/14/2016   MG 2.0 02/14/2016   Lab Results  Component Value Date   INR 1.52 06/08/2017   Lab Results  Component Value Date   CHOL 107 06/08/2017   HDL  40.80 06/08/2017   LDLCALC 49 06/08/2017   TRIG 85.0 06/08/2017     GEN- The patient is well appearing, alert and oriented x 3 today.   Head- normocephalic, atraumatic Eyes-  Sclera clear, conjunctiva pink Ears- hearing intact Oropharynx- clear Neck- supple, no JVP Lymph- no cervical lymphadenopathy Lungs- Clear to ausculation bilaterally, normal work of breathing Heart- Rapid  regular rate and rhythm, no murmurs, rubs or gallops, PMI not laterally displaced GI- soft, NT, ND, + BS Extremities- no clubbing, cyanosis, or edema MS- no significant deformity or atrophy Skin- no rash or lesion Psych- euthymic mood, full affect Neuro- strength and sensation are intact  EKG- a paced rhythm, v rate 60 bpm, qrs int 88 ms, qtc 396 ms    Assessment and Plan: 1. Atrial fib/ flutter Pt had unsuccessful cardioversion butr is not back in rhythm as he is a paced and pt feels improved with HR's in the 60's and regular at home Discussed start of AAD with Dr. Caryl Comes and he suggested to see how he is able to maintain SR as this has not been a frequent issues of his to break out of rhythm  Continue eliquis 5 mg bid for CHA2DS2VASc score of 5 and reminded not to miss doses Will continue cardizem 120 mg bid and change metoprolol 50 mg at hs to 25 mg bid for better coverage He was advised to decrease alcohol use to help discourage return of afib/flutter Continue to use Cpap to treat  OSA  2. PPM Per Dr. Caryl Comes and device clinc  3. HTN  Stable   F/u per device clinic check/Dr. Caryl Comes pending 10/8  Geroge Baseman. Timia Casselman, [facility] [address] [place], [place] [address] [phone]

## 2018-02-11 ENCOUNTER — Other Ambulatory Visit: Payer: Self-pay | Admitting: Internal Medicine

## 2018-02-12 DIAGNOSIS — G4733 Obstructive sleep apnea (adult) (pediatric): Secondary | ICD-10-CM | POA: Diagnosis not present

## 2018-02-12 NOTE — Telephone Encounter (Signed)
Eliquis 5mg  refill request received; pt is 84 yrs old, wt-101.2kg, Crea-1.17 on 01/17/18, last seen by Roderic Palau on 01/29/18; will send in refill to requested pharmacy.

## 2018-02-13 DIAGNOSIS — Z85828 Personal history of other malignant neoplasm of skin: Secondary | ICD-10-CM | POA: Diagnosis not present

## 2018-02-13 DIAGNOSIS — L821 Other seborrheic keratosis: Secondary | ICD-10-CM | POA: Diagnosis not present

## 2018-02-13 DIAGNOSIS — L918 Other hypertrophic disorders of the skin: Secondary | ICD-10-CM | POA: Diagnosis not present

## 2018-02-13 DIAGNOSIS — C44519 Basal cell carcinoma of skin of other part of trunk: Secondary | ICD-10-CM | POA: Diagnosis not present

## 2018-02-15 ENCOUNTER — Ambulatory Visit (INDEPENDENT_AMBULATORY_CARE_PROVIDER_SITE_OTHER): Payer: PPO | Admitting: Internal Medicine

## 2018-02-15 ENCOUNTER — Encounter: Payer: Self-pay | Admitting: Internal Medicine

## 2018-02-15 VITALS — BP 120/70 | HR 66 | Ht 69.0 in | Wt 223.1 lb

## 2018-02-15 DIAGNOSIS — K2971 Gastritis, unspecified, with bleeding: Secondary | ICD-10-CM | POA: Diagnosis not present

## 2018-02-15 DIAGNOSIS — D500 Iron deficiency anemia secondary to blood loss (chronic): Secondary | ICD-10-CM | POA: Diagnosis not present

## 2018-02-15 DIAGNOSIS — K589 Irritable bowel syndrome without diarrhea: Secondary | ICD-10-CM

## 2018-02-15 DIAGNOSIS — R195 Other fecal abnormalities: Secondary | ICD-10-CM

## 2018-02-15 MED ORDER — PANTOPRAZOLE SODIUM 40 MG PO TBEC: 40.0000 mg | DELAYED_RELEASE_TABLET | Freq: Every day | ORAL | 3 refills | Status: DC

## 2018-02-15 NOTE — Patient Instructions (Signed)
We have sent the following medications to your pharmacy for you to pick up at your convenience: Pantoprazole 40 mg daily  Continue Benefiber.  If you are age 82 or older, your body mass index should be between 23-30. Your Body mass index is 32.95 kg/m. If this is out of the aforementioned range listed, please consider follow up with your Primary Care Provider.  If you are age 63 or younger, your body mass index should be between 19-25. Your Body mass index is 32.95 kg/m. If this is out of the aformentioned range listed, please consider follow up with your Primary Care Provider.

## 2018-02-15 NOTE — Progress Notes (Signed)
Subjective:    Patient ID: Donald [name], male    DOB: 1932/11/26, 84 y.o.   MRN: [id number]  HPI Donald [name] is an 84 year old male with a history of iron deficiency anemia, heme positive stools with gastritis, history of gastric angiodysplasia, history of colon polyps, history of diverticulosis, history of internal hemorrhoids and IBS along with possible SIBO who is here for follow-up.  He was last seen on 12/08/2016.  He also has history of atrial fibrillation and takes Eliquis, CAD with pacemaker in place and hypertension.  He reports that he is been doing and feeling well.  We started him on Benefiber at his last visit for occasional loose stools and fecal seepage.  This has helped the symptoms tremendously.  Occasionally when he backed off the Benefiber dose he will have loose stool.  He has had no issues with heartburn, dysphagia or odynophagia.  No further black stools or evidence of GI bleed.  Energy levels have remained fairly well.  He is less active with his atrial fibrillation than he was previously.  His weight has been stable and is exactly the same as it was in July 2018   Review of Systems As per HPI, otherwise negative  Current Medications, Allergies, Past Medical History, Past Surgical History, Family History and Social History were reviewed in [employer] [employer] record.      Objective:   Physical Exam  BP 120/70   Pulse 66   Ht 5\' 9"  (1.753 m)   Wt 223 lb 2 oz (101.2 kg)   BMI 32.95 kg/m  Constitutional: Well-developed and well-nourished. No distress. HEENT: Normocephalic and atraumatic. Oropharynx is clear and moist. Conjunctivae are normal.  No scleral icterus. Neck: Neck supple. Trachea midline. Cardiovascular: Regular rate and rhythm, no murmurs rubs or gallops Pulmonary/chest: Effort normal and breath sounds normal. No wheezing, rales or rhonchi. Abdominal: Soft, nontender, nondistended. Bowel sounds active throughout.  Extremities: no clubbing,  cyanosis, or edema Neurological: Alert and oriented to person place and time. Skin: Skin is warm and dry. Psychiatric: Normal mood and affect. Behavior is normal.  CBC    Component Value Date/Time   WBC 8.8 01/17/2018 1055   RBC 4.31 01/17/2018 1055   HGB 14.3 01/17/2018 1055   HGB 14.6 01/02/2018 1622   HCT 42.3 01/17/2018 1055   HCT 43.5 01/02/2018 1622   PLT 180 01/17/2018 1055   PLT 215 01/02/2018 1622   MCV 98.1 01/17/2018 1055   MCV 99 (H) 01/02/2018 1622   MCH 33.2 01/17/2018 1055   MCHC 33.8 01/17/2018 1055   RDW 13.3 01/17/2018 1055   RDW 14.5 01/02/2018 1622   LYMPHSABS 1.6 06/08/2017 0927   LYMPHSABS 1.7 03/10/2017 0946   MONOABS 0.8 06/08/2017 0927   EOSABS 0.2 06/08/2017 0927   EOSABS 0.2 03/10/2017 0946   BASOSABS 0.0 06/08/2017 0927   BASOSABS 0.0 03/10/2017 0946   Iron/TIBC/Ferritin/ %Sat    Component Value Date/Time   IRON 78 06/08/2017 0927   TIBC 386 04/23/2015 1200   FERRITIN 34.4 06/08/2017 0927   IRONPCTSAT 19.8 (L) 06/08/2017 0927       Assessment & Plan:  84 year old male with a history of iron deficiency anemia, heme positive stools with gastritis, history of gastric angiodysplasia, history of colon polyps, history of diverticulosis, history of internal hemorrhoids and IBS along with possible SIBO who is here for follow-up.   1.  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  He has had  no further bleeding and no further anemia.  We discussed the need for pantoprazole at length today.  I feel that it is in his best interest to continue the pantoprazole given his history of erosive gastritis, gastric angiodysplasia and slow GI blood loss.  After this discussion he understands and is in agreement.  We will continue pantoprazole 40 mg daily  2.  IBS with loose stool --improved with the addition of Benefiber.  We will continue Benefiber 1 to 2 tablespoons daily.  3.  History of incomplete colonoscopy --we performed a  virtual colonoscopy after his last office visit which showed no concerning polypoid lesions in the colon.  He will likely not require further colorectal cancer screening/surveillance based on this negative test in his age of 41 years.  He ca follow-up every 1 to 2 years, sooner if needed  25 minutes spent with the patient today. Greater than 50% was spent in counseling and coordination of care with the patient

## 2018-02-20 ENCOUNTER — Ambulatory Visit (INDEPENDENT_AMBULATORY_CARE_PROVIDER_SITE_OTHER): Payer: PPO | Admitting: *Deleted

## 2018-02-20 DIAGNOSIS — I495 Sick sinus syndrome: Secondary | ICD-10-CM

## 2018-02-21 NOTE — Progress Notes (Signed)
Remote pacemaker transmission.   

## 2018-02-26 ENCOUNTER — Ambulatory Visit: Payer: PPO | Admitting: Adult Health

## 2018-02-26 ENCOUNTER — Encounter: Payer: Self-pay | Admitting: Adult Health

## 2018-02-26 ENCOUNTER — Other Ambulatory Visit: Payer: Self-pay | Admitting: Cardiology

## 2018-02-26 DIAGNOSIS — E669 Obesity, unspecified: Secondary | ICD-10-CM

## 2018-02-26 DIAGNOSIS — G4733 Obstructive sleep apnea (adult) (pediatric): Secondary | ICD-10-CM

## 2018-02-26 NOTE — Assessment & Plan Note (Signed)
Wt loss  

## 2018-02-26 NOTE — Progress Notes (Addendum)
Subjective:   Donald [name] is a 84 y.o. male who presents for Medicare Annual/Subsequent preventive examination.  Review of Systems:  No ROS.  Medicare Wellness Visit. Additional risk factors are reflected in the social history.  Cardiac Risk Factors include: advanced age (>4men, >51 women);dyslipidemia;hypertension;male gender Sleep patterns: feels rested on waking, gets up 1-2 times nightly to void and sleeps 7-8 hours nightly.  Wears C-PAP  Home Safety/Smoke Alarms: Feels safe in home. Smoke alarms in place.  Living environment; residence and Firearm Safety: apartment, no firearms. Resides at [facility] independent living. Seat Belt Safety/Bike Helmet: Wears seat belt.    Objective:    Vitals: BP 128/64   Pulse 60   Resp 18   Ht 5\' 9"  (1.753 m)   Wt 225 lb (102.1 kg)   SpO2 98%   BMI 33.23 kg/m   Body mass index is 33.23 kg/m.  Advanced Directives 02/27/2018 01/22/2018 06/08/2017 06/08/2017 03/16/2017 02/20/2017 02/14/2016  Does Patient Have a Medical Advance Directive? Yes Yes - Yes Yes Yes Yes  Type of Advance Directive [place];Living will [place];Living will Healthcare Power of [place];Living will [place];Living will [place];Living will [place];Living will  Does patient want to make changes to medical advance directive? - - - - No - Patient declined - -  Copy of [place] in Chart? No - copy requested No - copy requested No - copy requested - Yes No - copy requested No - copy requested  Would patient like information on creating a medical advance directive? - - - - - - -    Tobacco Social History   Tobacco Use  Smoking Status Former Smoker  . Packs/day: 0.00  . Years: 40.00  . Pack years: 0.00  . Types: Cigarettes, Cigars  . Last attempt to quit: 09/18/2008  . Years since quitting: 9.4  Smokeless Tobacco Former  Systems developer  . Types: Chew  Tobacco Comment   Has Occasional Cigar/ quit      Counseling given: Not Answered Comment: Has Occasional Cigar/ quit   Past Medical History:  Diagnosis Date  . Abnormal CT scan, stomach 02/2014   Thickening of gastric fundus and cardia.  gastritis on EGD 03/2014  . Allergic rhinitis due to pollen   . Arthropathy, unspecified, site unspecified   . Atherosclerosis    a. Noted by abdominal CT 02/2014 (h/o normal nuc 2011).  . Atrial flutter ([place]) 02/24/14   s/p ablation 11/15  . Cardiomyopathy ([place])    tachy mediated - a. TEE (10/15):  EF 30%;  b. Echo after NSR restored (10/15):  mild LVH, EF 55-60%, mild AS, mild AI, mild MR, mild to mod LAE, mild RAE  . Diverticulosis    severe in descending, sigmoid colon.   . ED (erectile dysfunction)   . Esophageal stricture 03/2014   traversable with endoscope. not dilated.   . Gastric AVM   . GERD (gastroesophageal reflux disease) 03/2014   small HH and gastritis on EGD  . GI bleed   . Habitual alcohol use   . Hemorrhoids   . Hiatal hernia   . Hyperlipidemia   . Hypertension   . Obesity   . OSA on CPAP   . PAF (paroxysmal atrial fibrillation) ([place])   . Prostatitis, unspecified   . 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 Skin cancer  basal and squamous cell   Past Surgical History:  Procedure Laterality Date  . ATRIAL FLUTTER ABLATION N/A 03/19/2014   RFCA of atrial flutter by Dr Caryl Comes  . CARDIOVERSION N/A 02/24/2014   Procedure: CARDIOVERSION;  Surgeon: Lelon Perla, MD;  Location: [place];  Service: Cardiovascular;  Laterality: N/A;  . CARDIOVERSION Right 09/10/2014   Procedure: CARDIOVERSION;  Surgeon: Deboraha Sprang, MD;  Location: [facility] CATH LAB;  Service: Cardiovascular;  Laterality: Right;  . CARDIOVERSION N/A 01/22/2018   Procedure: CARDIOVERSION;  Surgeon: Fay Records, MD;  Location: [facility] ENDOSCOPY;  Service: Cardiovascular;  Laterality: N/A;  . COLONOSCOPY N/A 04/23/2015    Procedure: COLONOSCOPY;  Surgeon: Ladene Artist, MD;  Location: [facility] ENDOSCOPY;  Service: Endoscopy;  Laterality: N/A;  . ENTEROSCOPY N/A 06/12/2015   Procedure: ENTEROSCOPY;  Surgeon: Ladene Artist, MD;  Location: [facility] ENDOSCOPY;  Service: Endoscopy;  Laterality: N/A;  . EP IMPLANTABLE DEVICE N/A 02/15/2016   Procedure: Pacemaker Implant;  Surgeon: Evans Lance, MD;  Location: [place] CV LAB;  Service: Cardiovascular;  Laterality: N/A;  . ESOPHAGOGASTRODUODENOSCOPY N/A 04/09/2014   Procedure: ESOPHAGOGASTRODUODENOSCOPY (EGD);  Surgeon: Inda Castle, MD;  Location: [place];  Service: Endoscopy;  Laterality: N/A;  . ESOPHAGOGASTRODUODENOSCOPY N/A 04/23/2015   Procedure: ESOPHAGOGASTRODUODENOSCOPY (EGD);  Surgeon: Ladene Artist, MD;  Location: [facility] ENDOSCOPY;  Service: Endoscopy;  Laterality: N/A;  . INGUINAL HERNIA REPAIR     right  . INGUINAL HERNIA REPAIR     left  . LEFT HEART CATH AND CORONARY ANGIOGRAPHY N/A 03/16/2017   Procedure: LEFT HEART CATH AND CORONARY ANGIOGRAPHY;  Surgeon: Burnell Blanks, MD;  Location: [place] CV LAB;  Service: Cardiovascular;  Laterality: N/A;  . ORIF fracture of the elbow    . SKIN CANCER EXCISION  05/21/12   Squamous cell ca  . TEE WITHOUT CARDIOVERSION N/A 02/24/2014   Procedure: TRANSESOPHAGEAL ECHOCARDIOGRAM (TEE);  Surgeon: Lelon Perla, MD;  Location: [facility] ENDOSCOPY;  Service: Cardiovascular;  Laterality: N/A;  . TONSILLECTOMY     Family History  Problem Relation Age of Onset  . Cancer Mother        ovarian  . Other Father        renal disease- grief over loss of spouse  . Cancer Brother        prostate- died of hematologic disorder 2nd to chemo  . Pulmonary fibrosis Brother   . Diabetes Neg Hx   . Coronary artery disease Neg Hx   . Colon cancer Neg Hx   . Stomach cancer Neg Hx   . Rectal cancer Neg Hx    Social History   Socioeconomic History  . Marital status: Married    Spouse name: Not on file  . Number of  children: 3  . Years of education: 73  . Highest education level: Not on file  Occupational History  . Occupation: Therapist, sports: RETIRED  Social Needs  . Financial resource strain: Not hard at all  . Food insecurity:    Worry: Never true    Inability: Never true  . Transportation needs:    Medical: No    Non-medical: No  Tobacco Use  . Smoking status: Former Smoker    Packs/day: 0.00    Years: 40.00    Pack years: 0.00    Types: Cigarettes, Cigars    Last attempt to quit: 09/18/2008    Years since quitting: 9.4  . Smokeless tobacco: Former Systems developer  Types: Chew  . Tobacco comment: Has Occasional Cigar/ quit   Substance and Sexual Activity  . Alcohol use: Yes    Alcohol/week: 28.0 standard drinks    Types: 21 Glasses of wine, 7 Shots of liquor per week  . Drug use: No  . Sexual activity: Not Currently  Lifestyle  . Physical activity:    Days per week: 4 days    Minutes per session: 50 min  . Stress: Not at all  Relationships  . Social connections:    Talks on phone: More than three times a week    Gets together: More than three times a week    Attends religious service: More than 4 times per year    Active member of club or organization: Yes    Attends meetings of clubs or organizations: More than 4 times per year    Relationship status: Married  Other Topics Concern  . 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 married 1961. 2 sons- '64, '63 , I daughter- '66, 4 grandchildren. work: Museum/gallery curator, retired but still consults. 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      No new stressors/     Outpatient Encounter Medications as of 02/27/2018  Medication Sig  . atorvastatin (LIPITOR) 40 MG tablet TAKE 1 TABLET ONCE EVERY EVENING  . diltiazem (CARDIZEM CD) 120 MG 24 hr capsule Take 1 capsule (120 mg total) by mouth 2 (two) times  daily.  Marland Kitchen ELIQUIS 5 MG TABS tablet TAKE 1 TABLET BY MOUTH TWICE DAILY.  . ferrous sulfate 325 (65 FE) MG tablet Take 1 tablet (325 mg total) by mouth daily with breakfast.  . fluticasone (FLONASE) 50 MCG/ACT nasal spray 1 spray in each nostril daily as needed for stuffiness (Patient taking differently: Place 1 spray into both nostrils daily as needed for allergies. )  . furosemide (LASIX) 20 MG tablet Take 20 mg by mouth daily.  . isosorbide mononitrate (IMDUR) 30 MG 24 hr tablet TAKE 1 TABLET BY MOUTH DAILY.  Marland Kitchen MAGNESIUM PO Take 6 tablets by mouth at bedtime.   . metoprolol tartrate (LOPRESSOR) 50 MG tablet Take 25 mg by mouth 2 (two) times daily.  . metroNIDAZOLE (METROGEL) 0.75 % gel Apply 1 application topically daily as needed (for rosacea).   . Multiple Vitamins-Minerals (EYE VITAMINS) CAPS Take 1 capsule by mouth 2 (two) times daily.  . Multiple Vitamins-Minerals (MULTIVITAMIN PO) Take 1 tablet by mouth 2 (two) times daily.  . nitroGLYCERIN (NITROSTAT) 0.4 MG SL tablet PLACE 1 TABLET UNDER THE TONGUE EVERY 5 MINUTES AS NEEDED FOR CHEST PAIN, MAY REPEAT FOR 3 DOSES. (Patient taking differently: Place 0.4 mg under the tongue every 5 (five) minutes as needed for chest pain. )  . pantoprazole (PROTONIX) 40 MG tablet Take 1 tablet (40 mg total) by mouth daily.  . [DISCONTINUED] isosorbide mononitrate (IMDUR) 30 MG 24 hr tablet Take 1 tablet (30 mg total) by mouth daily.   No facility-administered encounter medications on file as of 02/27/2018.     Activities of Daily Living In your present state of health, do you have any difficulty performing the following activities: 02/27/2018 06/08/2017  Hearing? N -  Vision? N -  Difficulty concentrating or making decisions? N -  Walking or climbing stairs? N -  Dressing or bathing? N -  Doing errands, shopping? N N  Preparing Food and eating ?  N -  Using the Toilet? N -  In the past six months, have you accidently leaked urine? N -  Do you have  problems with loss of bowel control? N -  Managing your Medications? N -  Managing your Finances? N -  Housekeeping or managing your Housekeeping? N -  Some recent data might be hidden    Patient Care Team: Janith Lima, MD as PCP - General (Internal Medicine) Deboraha Sprang, MD as PCP - Cardiology (Cardiology) Franchot Gallo, MD (Urology) [name], [name], MD (Dermatology) Monna Fam, MD as Consulting Physician (Ophthalmology) Deboraha Sprang, MD as Consulting Physician (Cardiology) Milus Banister, MD as Attending Physician (Gastroenterology)   Assessment:   This is a routine wellness examination for Luc. Physical assessment deferred to PCP.   Exercise Activities and Dietary recommendations Current Exercise Habits: Home exercise routine;Structured exercise class, Type of exercise: walking;calisthenics(water aerobics), Time (Minutes): 50, Frequency (Times/Week): 4, Weekly Exercise (Minutes/Week): 200, Intensity: Mild, Exercise limited by: None identified  Diet (meal preparation, eat out, water intake, caffeinated beverages, dairy products, fruits and vegetables): in general, a "healthy" diet  , well balanced   Reviewed heart healthy diet. Encouraged patient to increase daily water and healthy fluid intake. Discussed weight loss strategies.  Goals    . lose weight     Continue to increase the amount of physical activity, eat healthy, enjoy life and family.     . Patient Stated     I want to lose weight by using portion control, watching what I do eat, and cut back on the amount of wine I drink. Continue to stay active and exercise.    . Weight < 200 lb (90.719 kg)     1. Will make apt to Dr in GI to fup on GERDs 2. Would like to lose weight to help heart; help  Would like to have more energy; Portion control; Eat a big breakfast; lunch; big supper.        Fall Risk Fall Risk  02/27/2018 02/20/2017 08/09/2016 09/15/2014  Falls in the past year? No No No No     Depression Screen PHQ 2/9 Scores 02/27/2018 02/20/2017 08/09/2016 09/15/2014  PHQ - 2 Score 0 0 0 0  PHQ- 9 Score - 0 - -    Cognitive Function MMSE - Mini Mental State Exam 02/27/2018 02/20/2017 09/15/2014  Not completed: - - Unable to complete  Orientation to time 5 5 -  Orientation to Place 5 5 -  Registration 3 3 -  Attention/ Calculation 5 5 -  Recall 2 2 -  Language- name 2 objects 2 2 -  Language- repeat 1 1 -  Language- follow 3 step command 3 3 -  Language- read & follow direction 1 1 -  Write a sentence 1 1 -  Copy design 1 1 -  Total score 29 29 -        Immunization History  Administered Date(s) Administered  . H1N1 04/28/2008  . Influenza Split 02/23/2011, 02/07/2012  . Influenza Whole 02/07/2008, 02/04/2009  . Influenza, High Dose Seasonal PF 01/20/2015, 03/07/2016, 02/20/2017, 02/20/2018  . Influenza,inj,Quad PF,6+ Mos 02/07/2013, 02/03/2014  . Pneumococcal Conjugate-13 05/07/2013  . Pneumococcal Polysaccharide-23 03/20/2007, 03/07/2016  . Td 04/11/2008  . Zoster 01/27/2010   Screening Tests Health Maintenance  Topic Date Due  . TETANUS/TDAP  04/11/2018  . INFLUENZA VACCINE  Completed  . PNA vac Low Risk Adult  Completed      Plan:  Continue doing brain stimulating activities (puzzles, reading, adult coloring books, staying active) to keep memory sharp.   Continue to eat heart healthy diet (full of fruits, vegetables, whole grains, lean protein, water--limit salt, fat, and sugar intake) and increase physical activity as tolerated.  I have personally reviewed and noted the following in the patient's chart:   . Medical and social history . Use of alcohol, tobacco or illicit drugs  . Current medications and supplements . Functional ability and status . Nutritional status . Physical activity . Advanced directives . List of other physicians . Vitals . Screenings to include cognitive, depression, and falls . Referrals and appointments  In  addition, I have reviewed and discussed with patient certain preventive protocols, quality metrics, and best practice recommendations. A written personalized care plan for preventive services as well as general preventive health recommendations were provided to patient.   Medical screening examination/treatment/procedure(s) were performed by non-physician practitioner and as supervising physician I was immediately available for consultation/collaboration. I agree with above. [name], MD   Michiel Cowboy, RN  02/27/2018

## 2018-02-26 NOTE — Progress Notes (Signed)
@Patient  ID: Donald [name], male    DOB: 05/01/1933, 84 y.o.   MRN: [id number]  Chief Complaint  Patient presents with  . Follow-up    OSA     Referring provider: Janith Lima, MD  HPI: 84 year old male former smoker followed for obstructive sleep apnea on CPAP Previous trial with dental appliance without perceived benefit Medical history significant for atrial fib, tachybradycardia syndrome status post pacemaker, anticoagulation  TEST /Events  Spirometry - FEV1 ratio of 75, with FEV1 of 77% and FVC of 77% suggesting mild restriction CT chest in 04/2013 showed mild emphysema PSG 04/2014 - 206 lbs-Moderate OSA-stopped breathing 24 times an hour.- corrected by C Pap 13 cm, large full facemask  02/26/2018 Follow up : OSA  Patient presents for a one-year follow-up for sleep apnea.  Patient has underlying moderate sleep apnea he uses CPAP at bedtime.  Patient says he feels rested with no significant daytime sleepiness.  He wears his CPAP every night without any missed nights.  Patient is on CPAP 13 7 was H2O.  AHI 5.3.  Average usage at 8 hours.  Been having trouble with A Fib had recent cardioversion last month, remains on in NSR.  Remains on Eliquis .   Flu shot is utd.   Does water aerobic most days during the week. Discussed healthy weight and diet.     No Known Allergies  Immunization History  Administered Date(s) Administered  . H1N1 04/28/2008  . Influenza Split 02/23/2011, 02/07/2012  . Influenza Whole 02/07/2008, 02/04/2009  . Influenza, High Dose Seasonal PF 01/20/2015, 03/07/2016, 02/20/2017, 02/20/2018  . Influenza,inj,Quad PF,6+ Mos 02/07/2013, 02/03/2014  . Pneumococcal Conjugate-13 05/07/2013  . Pneumococcal Polysaccharide-23 03/20/2007, 03/07/2016  . Td 04/11/2008  . Zoster 01/27/2010    Past Medical History:  Diagnosis Date  . Abnormal CT scan, stomach 02/2014   Thickening of gastric fundus and cardia.  gastritis on EGD 03/2014  . Allergic  rhinitis due to pollen   . Arthropathy, unspecified, site unspecified   . Atherosclerosis    a. Noted by abdominal CT 02/2014 (h/o normal nuc 2011).  . Atrial flutter ([place]) 02/24/14   s/p ablation 11/15  . Cardiomyopathy ([place])    tachy mediated - a. TEE (10/15):  EF 30%;  b. Echo after NSR restored (10/15):  mild LVH, EF 55-60%, mild AS, mild AI, mild MR, mild to mod LAE, mild RAE  . Diverticulosis    severe in descending, sigmoid colon.   . ED (erectile dysfunction)   . Esophageal stricture 03/2014   traversable with endoscope. not dilated.   . Gastric AVM   . GERD (gastroesophageal reflux disease) 03/2014   small HH and gastritis on EGD  . GI bleed   . Habitual alcohol use   . Hemorrhoids   . Hiatal hernia   . Hyperlipidemia   . Hypertension   . Obesity   . OSA on CPAP   . PAF (paroxysmal atrial fibrillation) ([place])   . Prostatitis, unspecified   . 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 Skin cancer    basal and squamous cell    Tobacco History: Social History   Tobacco Use  Smoking Status Former Smoker  . Packs/day: 0.00  . Years: 40.00  . Pack years: 0.00  . Types: Cigarettes, Cigars  . Last attempt to quit: 09/18/2008  . Years since quitting: 9.4  Smokeless Tobacco Former Systems developer  . Types: Chew  Tobacco Comment  Has Occasional Cigar/ quit    Counseling given: Not Answered Comment: Has Occasional Cigar/ quit    Outpatient Medications Prior to Visit  Medication Sig Dispense Refill  . atorvastatin (LIPITOR) 40 MG tablet TAKE 1 TABLET ONCE EVERY EVENING 90 tablet 0  . diltiazem (CARDIZEM CD) 120 MG 24 hr capsule Take 1 capsule (120 mg total) by mouth 2 (two) times daily. 180 capsule 1  . ELIQUIS 5 MG TABS tablet TAKE 1 TABLET BY MOUTH TWICE DAILY. 180 tablet 3  . ferrous sulfate 325 (65 FE) MG tablet Take 1 tablet (325 mg total) by mouth daily with breakfast. 60 tablet 3  . fluticasone (FLONASE) 50 MCG/ACT nasal spray 1 spray in each nostril daily as  needed for stuffiness (Patient taking differently: Place 1 spray into both nostrils daily as needed for allergies. ) 16 g 11  . furosemide (LASIX) 20 MG tablet Take 20 mg by mouth daily.    . isosorbide mononitrate (IMDUR) 30 MG 24 hr tablet Take 1 tablet (30 mg total) by mouth daily. 30 tablet 3  . MAGNESIUM PO Take 6 tablets by mouth at bedtime.     . metoprolol tartrate (LOPRESSOR) 50 MG tablet Take 25 mg by mouth 2 (two) times daily.    . metroNIDAZOLE (METROGEL) 0.75 % gel Apply 1 application topically daily as needed (for rosacea).     . Multiple Vitamins-Minerals (MULTIVITAMIN PO) Take 1 tablet by mouth 2 (two) times daily.    . nitroGLYCERIN (NITROSTAT) 0.4 MG SL tablet PLACE 1 TABLET UNDER THE TONGUE EVERY 5 MINUTES AS NEEDED FOR CHEST PAIN, MAY REPEAT FOR 3 DOSES. (Patient taking differently: Place 0.4 mg under the tongue every 5 (five) minutes as needed for chest pain. ) 25 tablet 5  . pantoprazole (PROTONIX) 40 MG tablet Take 1 tablet (40 mg total) by mouth daily. 90 tablet 3   No facility-administered medications prior to visit.      Review of Systems  Constitutional:   No  weight loss, night sweats,  Fevers, chills, fatigue, or  lassitude.  HEENT:   No headaches,  Difficulty swallowing,  Tooth/dental problems, or  Sore throat,                No sneezing, itching, ear ache,  +nasal congestion, post nasal drip,   CV:  No chest pain,  Orthopnea, PND,   anasarca, dizziness, palpitations, syncope.   GI  No heartburn, indigestion, abdominal pain, nausea, vomiting, diarrhea, change in bowel habits, loss of appetite, bloody stools.   Resp: No shortness of breath with exertion or at rest.  No excess mucus, no productive cough,  No non-productive cough,  No coughing up of blood.  No change in color of mucus.  No wheezing.  No chest wall deformity  Skin: no rash or lesions.  GU: no dysuria, change in color of urine, no urgency or frequency.  No flank pain, no hematuria   MS:  No  joint pain or swelling.  No decreased range of motion.  No back pain.    Physical Exam  BP 138/76   Pulse 61   Ht 5' 8.5" (1.74 m)   Wt 226 lb (102.5 kg)   SpO2 96%   BMI 33.86 kg/m   GEN: A/Ox3; pleasant , NAD, obese    HEENT:  [place]/AT,  EACs-clear, TMs-wnl, NOSE-clear drainage THROAT-clear, no lesions, no postnasal drip or exudate noted. Class 2-3 MP airway   NECK:  Supple w/ fair ROM; no JVD; normal  carotid impulses w/o bruits; no thyromegaly or nodules palpated; no lymphadenopathy.    RESP  Clear  P & A; w/o, wheezes/ rales/ or rhonchi. no accessory muscle use, no dullness to percussion  CARD:  RRR, no m/r/g, tr  peripheral edema, pulses intact, no cyanosis or clubbing.  GI:   Soft & nt; nml bowel sounds; no organomegaly or masses detected.   Musco: Warm bil, no deformities or joint swelling noted.   Neuro: alert, no focal deficits noted.    Skin: Warm, no lesions or rashes    Lab Results:  CBC   BNP  Imaging: No results found.   Assessment & Plan:   OSA (obstructive sleep apnea) Excellent control of sleep apnea  Plan  Patient Instructions  Continue on C Pap at bedtime.  Keep up good work .  Work on healthy weight .   Do not drive if sleepy.  Use saline nasal spray and gel .As needed   Follow with Dr. Elsworth Soho or [place] NP in 1 year and As needed       Obesity (BMI 30.0-34.9) Wt loss      Rexene Edison, NP 02/26/2018

## 2018-02-26 NOTE — Patient Instructions (Signed)
Continue on C Pap at bedtime.  Keep up good work .  Work on healthy weight .   Do not drive if sleepy.  Use saline nasal spray and gel .As needed   Follow with Dr. Elsworth Soho or Jahmya Onofrio NP in 1 year and As needed

## 2018-02-26 NOTE — Assessment & Plan Note (Signed)
Excellent control of sleep apnea  Plan  Patient Instructions  Continue on C Pap at bedtime.  Keep up good work .  Work on healthy weight .   Do not drive if sleepy.  Use saline nasal spray and gel .As needed   Follow with Dr. Elsworth Soho or Yazan Gatling NP in 1 year and As needed

## 2018-02-27 ENCOUNTER — Ambulatory Visit (INDEPENDENT_AMBULATORY_CARE_PROVIDER_SITE_OTHER): Payer: PPO | Admitting: *Deleted

## 2018-02-27 VITALS — BP 128/64 | HR 60 | Resp 18 | Ht 69.0 in | Wt 225.0 lb

## 2018-02-27 DIAGNOSIS — Z0000 Encounter for general adult medical examination without abnormal findings: Secondary | ICD-10-CM

## 2018-02-27 MED FILL — SHINGRIX 50 MCG SUS: 50 | 1 days supply | Qty: 1 | Fill #0

## 2018-02-27 NOTE — Patient Instructions (Addendum)
Continue doing brain stimulating activities (puzzles, reading, adult coloring books, staying active) to keep memory sharp.   Continue to eat heart healthy diet (full of fruits, vegetables, whole grains, lean protein, water--limit salt, fat, and sugar intake) and increase physical activity as tolerated.  [place] at [facility] [place] [phone]  Donald [name] , Thank you for taking time to come for your Medicare Wellness Visit. I appreciate your ongoing commitment to your health goals. Please review the following plan we discussed and let me know if I can assist you in the future.   These are the goals we discussed: Goals    . lose weight     Continue to increase the amount of physical activity, eat healthy, enjoy life and family.     . Patient Stated     I want to lose weight by using portion control, watching what I do eat, and cut back on the amount of wine I drink. Continue to stay active and exercise.    . Weight < 200 lb (90.719 kg)     1. Will make apt to Dr in GI to fup on GERDs 2. Would like to lose weight to help heart; help  Would like to have more energy; Portion control; Eat a big breakfast; lunch; big supper.        This is a list of the screening recommended for you and due dates:  Health Maintenance  Topic Date Due  . Tetanus Vaccine  04/11/2018  . Flu Shot  Completed  . Pneumonia vaccines  Completed     Health Maintenance, Male A healthy lifestyle and preventive care is important for your health and wellness. Ask your health care provider about what schedule of regular examinations is right for you. What should I know about weight and diet? Eat a Healthy Diet  Eat plenty of vegetables, fruits, whole grains, low-fat dairy products, and lean protein.  Do not eat a lot of foods high in solid fats, added sugars, or salt.  Maintain a Healthy Weight Regular exercise can help you achieve or  maintain a healthy weight. You should:  Do at least 150 minutes of exercise each week. The exercise should increase your heart rate and make you sweat (moderate-intensity exercise).  Do strength-training exercises at least twice a week.  Watch Your Levels of Cholesterol and Blood Lipids  Have your blood tested for lipids and cholesterol every 5 years starting at 82 years of age. If you are at high risk for heart disease, you should start having your blood tested when you are 82 years old. You may need to have your cholesterol levels checked more often if: ? Your lipid or cholesterol levels are high. ? You are older than 83 years of age. ? You are at high risk for heart disease.  What should I know about cancer screening? Many types of cancers can be detected early and may often be prevented. Lung Cancer  You should be screened every year for lung cancer if: ? You are a current smoker who has smoked for at least 30 years. ? You are a former smoker who has quit within the past 15 years.  Talk to your health care provider about your screening options, when you should start screening, and how often you should be screened.  Colorectal Cancer  Routine colorectal cancer screening usually begins at 82 years of age and should be repeated every 5-10  years until you are 82 years old. You may need to be screened more often if early forms of precancerous polyps or small growths are found. Your health care provider may recommend screening at an earlier age if you have risk factors for colon cancer.  Your health care provider may recommend using home test kits to check for hidden blood in the stool.  A small camera at the end of a tube can be used to examine your colon (sigmoidoscopy or colonoscopy). This checks for the earliest forms of colorectal cancer.  Prostate and Testicular Cancer  Depending on your age and overall health, your health care provider may do certain tests to screen for prostate  and testicular cancer.  Talk to your health care provider about any symptoms or concerns you have about testicular or prostate cancer.  Skin Cancer  Check your skin from head to toe regularly.  Tell your health care provider about any new moles or changes in moles, especially if: ? There is a change in a mole's size, shape, or color. ? You have a mole that is larger than a pencil eraser.  Always use sunscreen. Apply sunscreen liberally and repeat throughout the day.  Protect yourself by wearing long sleeves, pants, a wide-brimmed hat, and sunglasses when outside.  What should I know about heart disease, diabetes, and high blood pressure?  If you are 59-58 years of age, have your blood pressure checked every 3-5 years. If you are 63 years of age or older, have your blood pressure checked every year. You should have your blood pressure measured twice-once when you are at a hospital or clinic, and once when you are not at a hospital or clinic. Record the average of the two measurements. To check your blood pressure when you are not at a hospital or clinic, you can use: ? An automated blood pressure machine at a pharmacy. ? A home blood pressure monitor.  Talk to your health care provider about your target blood pressure.  If you are between 68-22 years old, ask your health care provider if you should take aspirin to prevent heart disease.  Have regular diabetes screenings by checking your fasting blood sugar level. ? If you are at a normal weight and have a low risk for diabetes, have this test once every three years after the age of 26. ? If you are overweight and have a high risk for diabetes, consider being tested at a younger age or more often.  A one-time screening for abdominal aortic aneurysm (AAA) by ultrasound is recommended for men aged 95-75 years who are current or former smokers. What should I know about preventing infection? Hepatitis B If you have a higher risk for  hepatitis B, you should be screened for this virus. Talk with your health care provider to find out if you are at risk for hepatitis B infection. Hepatitis C Blood testing is recommended for:  Everyone born from [birth year] through 1963.  Anyone with known risk factors for hepatitis C.  Sexually Transmitted Diseases (STDs)  You should be screened each year for STDs including gonorrhea and chlamydia if: ? You are sexually active and are younger than 82 years of age. ? You are older than 82 years of age and your health care provider tells you that you are at risk for this type of infection. ? Your sexual activity has changed since you were last screened and you are at an increased risk for chlamydia or gonorrhea. Ask your health  care provider if you are at risk.  Talk with your health care provider about whether you are at high risk of being infected with HIV. Your health care provider may recommend a prescription medicine to help prevent HIV infection.  What else can I do?  Schedule regular health, dental, and eye exams.  Stay current with your vaccines (immunizations).  Do not use any tobacco products, such as cigarettes, chewing tobacco, and e-cigarettes. If you need help quitting, ask your health care provider.  Limit alcohol intake to no more than 2 drinks per day. One drink equals 12 ounces of beer, 5 ounces of wine, or 1 ounces of hard liquor.  Do not use street drugs.  Do not share needles.  Ask your health care provider for help if you need support or information about quitting drugs.  Tell your health care provider if you often feel depressed.  Tell your health care provider if you have ever been abused or do not feel safe at home. This information is not intended to replace advice given to you by your health care provider. Make sure you discuss any questions you have with your health care provider. Document Released: 10/29/2007 Document Revised: 12/30/2015 Document Reviewed:  02/03/2015 Elsevier Interactive Patient Education  [employer].

## 2018-03-01 ENCOUNTER — Encounter: Payer: Self-pay | Admitting: Cardiology

## 2018-03-10 DIAGNOSIS — G4733 Obstructive sleep apnea (adult) (pediatric): Secondary | ICD-10-CM | POA: Diagnosis not present

## 2018-04-03 DIAGNOSIS — G4733 Obstructive sleep apnea (adult) (pediatric): Secondary | ICD-10-CM | POA: Diagnosis not present

## 2018-04-04 ENCOUNTER — Telehealth (HOSPITAL_COMMUNITY): Payer: Self-pay | Admitting: *Deleted

## 2018-04-04 ENCOUNTER — Encounter (HOSPITAL_COMMUNITY): Payer: Self-pay | Admitting: Nurse Practitioner

## 2018-04-04 ENCOUNTER — Ambulatory Visit (HOSPITAL_COMMUNITY)
Admission: RE | Admit: 2018-04-04 | Discharge: 2018-04-04 | Disposition: A | Discharge status: Home / Self Care | Payer: PPO | Source: Ambulatory Visit | Attending: Nurse Practitioner | Admitting: Nurse Practitioner

## 2018-04-04 VITALS — BP 100/64 | HR 114 | Ht 69.0 in | Wt 221.6 lb

## 2018-04-04 DIAGNOSIS — I480 Paroxysmal atrial fibrillation: Secondary | ICD-10-CM | POA: Diagnosis not present

## 2018-04-04 DIAGNOSIS — Z836 Family history of other diseases of the respiratory system: Secondary | ICD-10-CM | POA: Diagnosis not present

## 2018-04-04 DIAGNOSIS — G4733 Obstructive sleep apnea (adult) (pediatric): Secondary | ICD-10-CM | POA: Diagnosis not present

## 2018-04-04 DIAGNOSIS — Z7901 Long term (current) use of anticoagulants: Secondary | ICD-10-CM | POA: Diagnosis not present

## 2018-04-04 DIAGNOSIS — I429 Cardiomyopathy, unspecified: Secondary | ICD-10-CM | POA: Insufficient documentation

## 2018-04-04 DIAGNOSIS — Z841 Family history of disorders of kidney and ureter: Secondary | ICD-10-CM | POA: Diagnosis not present

## 2018-04-04 DIAGNOSIS — Z87891 Personal history of nicotine dependence: Secondary | ICD-10-CM | POA: Diagnosis not present

## 2018-04-04 DIAGNOSIS — Z8042 Family history of malignant neoplasm of prostate: Secondary | ICD-10-CM | POA: Insufficient documentation

## 2018-04-04 DIAGNOSIS — Z6832 Body mass index (BMI) 32.0-32.9, adult: Secondary | ICD-10-CM | POA: Insufficient documentation

## 2018-04-04 DIAGNOSIS — I4891 Unspecified atrial fibrillation: Secondary | ICD-10-CM | POA: Diagnosis not present

## 2018-04-04 DIAGNOSIS — Z79899 Other long term (current) drug therapy: Secondary | ICD-10-CM | POA: Insufficient documentation

## 2018-04-04 DIAGNOSIS — K219 Gastro-esophageal reflux disease without esophagitis: Secondary | ICD-10-CM | POA: Diagnosis not present

## 2018-04-04 DIAGNOSIS — Z950 Presence of cardiac pacemaker: Secondary | ICD-10-CM | POA: Diagnosis not present

## 2018-04-04 DIAGNOSIS — E669 Obesity, unspecified: Secondary | ICD-10-CM | POA: Insufficient documentation

## 2018-04-04 DIAGNOSIS — E785 Hyperlipidemia, unspecified: Secondary | ICD-10-CM | POA: Diagnosis not present

## 2018-04-04 DIAGNOSIS — I4892 Unspecified atrial flutter: Secondary | ICD-10-CM | POA: Insufficient documentation

## 2018-04-04 DIAGNOSIS — I10 Essential (primary) hypertension: Secondary | ICD-10-CM | POA: Diagnosis not present

## 2018-04-04 DIAGNOSIS — Z85828 Personal history of other malignant neoplasm of skin: Secondary | ICD-10-CM | POA: Insufficient documentation

## 2018-04-04 NOTE — Telephone Encounter (Signed)
Patient called in stating he wonders if he may be in afib since last night. His bp machine is having a hard time registering his heart rate. He is going to attempt to send a remote transmission to see what his rhythm/rate is. Will be in touch with him after the device clinic reviews his transmission.

## 2018-04-04 NOTE — Progress Notes (Signed)
Primary Care Physician: Janith Lima, MD Referring Physician: Dr. Jerrell Belfast Donald [name] is a 84 y.o. male with a h/o atrial flutter,afib, PPM, for tachy/brady syndrome, s/p ablation, OSA on cpap, alcohol use, cardiomyopathy, that was set up for cardioversion by Dr. Caryl Comes, but reported to the endo group that he had missed a dose of Eliquis, so DCCV was cancelled. He is now back in the afib clinic to get set up for cardioversion. He will be eligible for cardioversion the first of the week, not have missing any anticoagulation x 3 weeks. He remains in atrial flutter with RVR.  F/u from unsuccessful cardioversion, 9/9.He reports that his heart rate became regular and in the 60's 2 hours after cardioversion and he has been staying in SR since then. I discussed with Dr. Caryl Comes and instead of starting antiarrythmic, will see if pt continues in SR for now since he has not had frequent breakthrough episodes in the past. He feels improved.  F/u in afib clinic, 11/21. He went back into afib  last night around 5 pm. He continues in afib with RVR at 114 bpm, but in the office on pulse ox he is running around 80-90's at rest. He has reduced his nightly alcohol from 4 drinks a night but is still  drinking 2 drinks nightly. I believe he will  need an antiarrythmic and will confer with Dr. Caryl Comes which he drug he  would like to use. He  is using cpap.  Today, he denies symptoms of palpitations, chest pain, shortness of breath, orthopnea, PND, lower extremity edema, dizziness, presyncope, syncope, or neurologic sequela. The patient is tolerating medications without difficulties and is otherwise without complaint today.   Past Medical History:  Diagnosis Date  . Abnormal CT scan, stomach 02/2014   Thickening of gastric fundus and cardia.  gastritis on EGD 03/2014  . Allergic rhinitis due to pollen   . Arthropathy, unspecified, site unspecified   . Atherosclerosis    a. Noted by abdominal CT 02/2014 (h/o normal  nuc 2011).  . Atrial flutter ([place]) 02/24/14   s/p ablation 11/15  . Cardiomyopathy ([place])    tachy mediated - a. TEE (10/15):  EF 30%;  b. Echo after NSR restored (10/15):  mild LVH, EF 55-60%, mild AS, mild AI, mild MR, mild to mod LAE, mild RAE  . Diverticulosis    severe in descending, sigmoid colon.   . ED (erectile dysfunction)   . Esophageal stricture 03/2014   traversable with endoscope. not dilated.   . Gastric AVM   . GERD (gastroesophageal reflux disease) 03/2014   small HH and gastritis on EGD  . GI bleed   . Habitual alcohol use   . Hemorrhoids   . Hiatal hernia   . Hyperlipidemia   . Hypertension   . Obesity   . OSA on CPAP   . PAF (paroxysmal atrial fibrillation) ([place])   . Prostatitis, unspecified   . 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 Skin cancer    basal and squamous cell   Past Surgical History:  Procedure Laterality Date  . ATRIAL FLUTTER ABLATION N/A 03/19/2014   RFCA of atrial flutter by Dr Caryl Comes  . CARDIOVERSION N/A 02/24/2014   Procedure: CARDIOVERSION;  Surgeon: Lelon Perla, MD;  Location: [place];  Service: Cardiovascular;  Laterality: N/A;  . CARDIOVERSION Right 09/10/2014   Procedure: CARDIOVERSION;  Surgeon: Deboraha Sprang, MD;  Location: [facility] CATH LAB;  Service: Cardiovascular;  Laterality: Right;  . CARDIOVERSION N/A 01/22/2018   Procedure: CARDIOVERSION;  Surgeon: Fay Records, MD;  Location: [facility] ENDOSCOPY;  Service: Cardiovascular;  Laterality: N/A;  . COLONOSCOPY N/A 04/23/2015   Procedure: COLONOSCOPY;  Surgeon: Ladene Artist, MD;  Location: [facility] ENDOSCOPY;  Service: Endoscopy;  Laterality: N/A;  . ENTEROSCOPY N/A 06/12/2015   Procedure: ENTEROSCOPY;  Surgeon: Ladene Artist, MD;  Location: [facility] ENDOSCOPY;  Service: Endoscopy;  Laterality: N/A;  . EP IMPLANTABLE DEVICE N/A 02/15/2016   Procedure: Pacemaker Implant;  Surgeon: Evans Lance, MD;  Location: [place] CV LAB;  Service: Cardiovascular;  Laterality: N/A;  .  ESOPHAGOGASTRODUODENOSCOPY N/A 04/09/2014   Procedure: ESOPHAGOGASTRODUODENOSCOPY (EGD);  Surgeon: Inda Castle, MD;  Location: [place];  Service: Endoscopy;  Laterality: N/A;  . ESOPHAGOGASTRODUODENOSCOPY N/A 04/23/2015   Procedure: ESOPHAGOGASTRODUODENOSCOPY (EGD);  Surgeon: Ladene Artist, MD;  Location: [facility] ENDOSCOPY;  Service: Endoscopy;  Laterality: N/A;  . INGUINAL HERNIA REPAIR     right  . INGUINAL HERNIA REPAIR     left  . LEFT HEART CATH AND CORONARY ANGIOGRAPHY N/A 03/16/2017   Procedure: LEFT HEART CATH AND CORONARY ANGIOGRAPHY;  Surgeon: Burnell Blanks, MD;  Location: [place] CV LAB;  Service: Cardiovascular;  Laterality: N/A;  . ORIF fracture of the elbow    . SKIN CANCER EXCISION  05/21/12   Squamous cell ca  . TEE WITHOUT CARDIOVERSION N/A 02/24/2014   Procedure: TRANSESOPHAGEAL ECHOCARDIOGRAM (TEE);  Surgeon: Lelon Perla, MD;  Location: [facility] ENDOSCOPY;  Service: Cardiovascular;  Laterality: N/A;  . TONSILLECTOMY      Current Outpatient Medications  Medication Sig Dispense Refill  . atorvastatin (LIPITOR) 40 MG tablet TAKE 1 TABLET ONCE EVERY EVENING 90 tablet 0  . diltiazem (CARDIZEM CD) 120 MG 24 hr capsule Take 1 capsule (120 mg total) by mouth 2 (two) times daily. 180 capsule 1  . ELIQUIS 5 MG TABS tablet TAKE 1 TABLET BY MOUTH TWICE DAILY. 180 tablet 3  . ferrous sulfate 325 (65 FE) MG tablet Take 1 tablet (325 mg total) by mouth daily with breakfast. 60 tablet 3  . fluticasone (FLONASE) 50 MCG/ACT nasal spray 1 spray in each nostril daily as needed for stuffiness (Patient taking differently: Place 1 spray into both nostrils daily as needed for allergies. ) 16 g 11  . furosemide (LASIX) 20 MG tablet Take 20 mg by mouth daily.    . isosorbide mononitrate (IMDUR) 30 MG 24 hr tablet TAKE 1 TABLET BY MOUTH DAILY. 90 tablet 3  . MAGNESIUM PO Take 6 tablets by mouth at bedtime.     . metoprolol tartrate (LOPRESSOR) 50 MG tablet Take 25 mg by mouth 2  (two) times daily.    . metroNIDAZOLE (METROGEL) 0.75 % gel Apply 1 application topically daily as needed (for rosacea).     . Multiple Vitamins-Minerals (EYE VITAMINS) CAPS Take 1 capsule by mouth 2 (two) times daily.    . Multiple Vitamins-Minerals (MULTIVITAMIN PO) Take 1 tablet by mouth 2 (two) times daily.    . nitroGLYCERIN (NITROSTAT) 0.4 MG SL tablet PLACE 1 TABLET UNDER THE TONGUE EVERY 5 MINUTES AS NEEDED FOR CHEST PAIN, MAY REPEAT FOR 3 DOSES. (Patient taking differently: Place 0.4 mg under the tongue every 5 (five) minutes as needed for chest pain. ) 25 tablet 5  . pantoprazole (PROTONIX) 40 MG tablet Take 1 tablet (40 mg total) by mouth daily. 90 tablet 3   No current facility-administered medications for  this encounter.     No Known Allergies  Social History   Socioeconomic History  . Marital status: Married    Spouse name: Not on file  . Number of children: 3  . Years of education: 24  . Highest education level: Not on file  Occupational History  . Occupation: Therapist, sports: RETIRED  Social Needs  . Financial resource strain: Not hard at all  . Food insecurity:    Worry: Never true    Inability: Never true  . Transportation needs:    Medical: No    Non-medical: No  Tobacco Use  . Smoking status: Former Smoker    Packs/day: 0.00    Years: 40.00    Pack years: 0.00    Types: Cigarettes, Cigars    Last attempt to quit: 09/18/2008    Years since quitting: 9.5  . Smokeless tobacco: Former Systems developer    Types: Chew  . Tobacco comment: Has Occasional Cigar/ quit   Substance and Sexual Activity  . Alcohol use: Yes    Alcohol/week: 28.0 standard drinks    Types: 21 Glasses of wine, 7 Shots of liquor per week    Comment: states he does want to drink less and cut back on the amount.   . Drug use: No  . Sexual activity: Not Currently  Lifestyle  . Physical activity:    Days per week: 4 days    Minutes per session: 50 min  . Stress: Not at all    Relationships  . Social connections:    Talks on phone: More than three times a week    Gets together: More than three times a week    Attends religious service: More than 4 times per year    Active member of club or organization: Yes    Attends meetings of clubs or organizations: More than 4 times per year    Relationship status: Married  . Intimate partner violence:    Fear of current or ex partner: No    Emotionally abused: No    Physically abused: No    Forced sexual activity: No  Other Topics Concern  . 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 married 1961. 2 sons- '64, '63 , I daughter- '66, 4 grandchildren. work: Museum/gallery curator, retired but still consults. 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      No new stressors/     Family History  Problem Relation Age of Onset  . Cancer Mother        ovarian  . Other Father        renal disease- grief over loss of spouse  . Cancer Brother        prostate- died of hematologic disorder 2nd to chemo  . Pulmonary fibrosis Brother   . Diabetes Neg Hx   . Coronary artery disease Neg Hx   . Colon cancer Neg Hx   . Stomach cancer Neg Hx   . Rectal cancer Neg Hx     ROS- All systems are reviewed and negative except as per the HPI above  Physical Exam: Vitals:   04/04/18 1329  BP: 100/64  Pulse: (!) 114  Weight: 100.5 kg  Height: 5\' 9"  (1.753 m)   Wt Readings from Last 3 Encounters:  04/04/18 100.5 kg  02/27/18 102.1 kg  02/26/18 102.5 kg    Labs:  Lab Results  Component Value Date   NA 140 01/17/2018   K 4.5 01/17/2018   CL 107 01/17/2018   CO2 25 01/17/2018   GLUCOSE 106 (H) 01/17/2018   BUN 18 01/17/2018   CREATININE 1.17 01/17/2018   CALCIUM 9.4 01/17/2018   PHOS 2.7 02/14/2016   MG 2.0 02/14/2016   Lab Results  Component Value Date   INR 1.52 06/08/2017   Lab Results  Component Value  Date   CHOL 107 06/08/2017   HDL 40.80 06/08/2017   LDLCALC 49 06/08/2017   TRIG 85.0 06/08/2017     GEN- The patient is well appearing, alert and oriented x 3 today.   Head- normocephalic, atraumatic Eyes-  Sclera clear, conjunctiva pink Ears- hearing intact Oropharynx- clear Neck- supple, no JVP Lymph- no cervical lymphadenopathy Lungs- Clear to ausculation bilaterally, normal work of breathing Heart- Rapid regular rate and rhythm, no murmurs, rubs or gallops, PMI not laterally displaced GI- soft, NT, ND, + BS Extremities- no clubbing, cyanosis, or edema MS- no significant deformity or atrophy Skin- no rash or lesion Psych- euthymic mood, full affect Neuro- strength and sensation are intact  EKG- afib with RVR at 114 bpm    Assessment and Plan: 1. Atrial fib/ flutter Pt had unsuccessful cardioversion but went back in rhythm shortly after cardioversion in September He is now back in afib  Continue eliquis 5 mg bid for CHA2DS2VASc score of 5 and reminded not to miss doses, which he states that he has not done Will continue cardizem 120 mg bid and  metoprolol  25 mg bid for better coverage He was advised to decrease alcohol use to help discourage return of afib/flutter to no more than 2 a week  Continue to use Cpap to treat  OSA  2. PPM Per Dr. Caryl Comes and device clinc  3. HTN  Stable   I will back in touch re antiarrythmic use once I hear from Dr. Norma Fredrickson C. Silverio Hagan, [facility] [address] [place], [place] [address] [phone]

## 2018-04-04 NOTE — Telephone Encounter (Signed)
Transmission received presenting rhythm AF w/ V rates 110-120s. Appears that AF episode started around 5:27 pm yesterday.

## 2018-04-04 NOTE — Telephone Encounter (Signed)
Patient notified of heart rhythm - he is unable to check his BP. Will bring by for assessment this afternoon.

## 2018-04-06 LAB — CUP PACEART REMOTE DEVICE CHECK
Brady Statistic AP VS Percent: 96 %
Brady Statistic AS VP Percent: 1 %
Brady Statistic RA Percent Paced: 95 %
Brady Statistic RV Percent Paced: 1 %
Date Time Interrogation Session: 20191007071044
Implantable Lead Location: 753859
Implantable Pulse Generator Implant Date: 20171002
Lead Channel Pacing Threshold Amplitude: 0.5 V
Lead Channel Pacing Threshold Pulse Width: 0.5 ms
Lead Channel Sensing Intrinsic Amplitude: 12 mV
Lead Channel Setting Pacing Amplitude: 2 V
Lead Channel Setting Pacing Amplitude: 2.5 V
Lead Channel Setting Pacing Pulse Width: 0.5 ms
Lead Channel Setting Sensing Sensitivity: 2 mV
MDC IDC LEAD IMPLANT DT: 20171002
MDC IDC LEAD IMPLANT DT: 20171002
MDC IDC LEAD LOCATION: 753860
MDC IDC MSMT BATTERY REMAINING LONGEVITY: 111 mo
MDC IDC MSMT BATTERY REMAINING PERCENTAGE: 95.5 %
MDC IDC MSMT BATTERY VOLTAGE: 3.01 V
MDC IDC MSMT LEADCHNL RA IMPEDANCE VALUE: 430 Ohm
MDC IDC MSMT LEADCHNL RA PACING THRESHOLD AMPLITUDE: 0.75 V
MDC IDC MSMT LEADCHNL RA SENSING INTR AMPL: 3.1 mV
MDC IDC MSMT LEADCHNL RV IMPEDANCE VALUE: 510 Ohm
MDC IDC MSMT LEADCHNL RV PACING THRESHOLD PULSEWIDTH: 0.5 ms
MDC IDC STAT BRADY AP VP PERCENT: 1 %
MDC IDC STAT BRADY AS VS PERCENT: 3.8 %
Pulse Gen Model: 2272
Pulse Gen Serial Number: 7951215

## 2018-04-11 ENCOUNTER — Ambulatory Visit (HOSPITAL_COMMUNITY)
Admission: RE | Admit: 2018-04-11 | Discharge: 2018-04-11 | Disposition: A | Discharge status: Home / Self Care | Payer: PPO | Source: Ambulatory Visit | Attending: Nurse Practitioner | Admitting: Nurse Practitioner

## 2018-04-11 DIAGNOSIS — K219 Gastro-esophageal reflux disease without esophagitis: Secondary | ICD-10-CM | POA: Insufficient documentation

## 2018-04-11 DIAGNOSIS — I4819 Other persistent atrial fibrillation: Secondary | ICD-10-CM

## 2018-04-11 DIAGNOSIS — I10 Essential (primary) hypertension: Secondary | ICD-10-CM | POA: Diagnosis not present

## 2018-04-11 DIAGNOSIS — G4733 Obstructive sleep apnea (adult) (pediatric): Secondary | ICD-10-CM | POA: Diagnosis not present

## 2018-04-11 DIAGNOSIS — I480 Paroxysmal atrial fibrillation: Secondary | ICD-10-CM | POA: Insufficient documentation

## 2018-04-11 DIAGNOSIS — Z9889 Other specified postprocedural states: Secondary | ICD-10-CM | POA: Insufficient documentation

## 2018-04-11 DIAGNOSIS — I4892 Unspecified atrial flutter: Secondary | ICD-10-CM | POA: Diagnosis not present

## 2018-04-11 DIAGNOSIS — Z7901 Long term (current) use of anticoagulants: Secondary | ICD-10-CM | POA: Insufficient documentation

## 2018-04-11 DIAGNOSIS — I4891 Unspecified atrial fibrillation: Secondary | ICD-10-CM | POA: Diagnosis not present

## 2018-04-11 DIAGNOSIS — E785 Hyperlipidemia, unspecified: Secondary | ICD-10-CM | POA: Diagnosis not present

## 2018-04-11 DIAGNOSIS — Z87891 Personal history of nicotine dependence: Secondary | ICD-10-CM | POA: Insufficient documentation

## 2018-04-11 DIAGNOSIS — Z79899 Other long term (current) drug therapy: Secondary | ICD-10-CM | POA: Diagnosis not present

## 2018-04-11 LAB — COMPREHENSIVE METABOLIC PANEL
ALK PHOS: 72 U/L (ref 38–126)
ALT: 19 U/L (ref 0–44)
ANION GAP: 6 (ref 5–15)
AST: 20 U/L (ref 15–41)
Albumin: 3.8 g/dL (ref 3.5–5.0)
BILIRUBIN TOTAL: 0.7 mg/dL (ref 0.3–1.2)
BUN: 20 mg/dL (ref 8–23)
CALCIUM: 9.2 mg/dL (ref 8.9–10.3)
CO2: 25 mmol/L (ref 22–32)
Chloride: 107 mmol/L (ref 98–111)
Creatinine, Ser: 1.14 mg/dL (ref 0.61–1.24)
GFR, EST NON AFRICAN AMERICAN: 59 mL/min — AB (ref >60)
GLUCOSE: 110 mg/dL — AB (ref 70–99)
Potassium: 4.1 mmol/L (ref 3.5–5.1)
SODIUM: 138 mmol/L (ref 135–145)
TOTAL PROTEIN: 6.8 g/dL (ref 6.5–8.1)

## 2018-04-11 LAB — TSH: TSH: 0.564 u[IU]/mL (ref 0.350–4.500)

## 2018-04-11 NOTE — Progress Notes (Signed)
Primary Care Physician: Janith Lima, MD Referring Physician: Dr. Jerrell Belfast Donald [name] is a 84 y.o. male with a h/o atrial flutter,afib, PPM, for tachy/brady syndrome, s/p ablation, OSA on cpap, alcohol use, cardiomyopathy, that was set up for cardioversion by Dr. Caryl Comes, but reported to the endo group that he had missed a dose of Eliquis, so DCCV was cancelled. He is now back in the afib clinic to get set up for cardioversion. He will be eligible for cardioversion the first of the week, not have missing any anticoagulation x 3 weeks. He remains in atrial flutter with RVR.  F/u from unsuccessful cardioversion, 9/9.He reports that his heart rate became regular and in the 60's 2 hours after cardioversion and he has been staying in SR since then. I discussed with Dr. Caryl Comes and instead of starting antiarrythmic, will see if pt continues in SR for now since he has not had frequent breakthrough episodes in the past. He feels improved.  F/u in afib clinic, 11/21. He went back into afib  last night around 5 pm. He continues in afib with RVR at 114 bpm, but in the office on pulse ox he is running around 80-90's at rest. He has reduced his nightly alcohol from 4 drinks a night but is still  drinking 2 drinks nightly. I believe he will  need an antiarrythmic and will confer with Dr. Caryl Comes which he drug he  would like to use. He  is using cpap.  F/u in afib clinic, 11/27. After discussion with Dr. Caryl Comes he feels that pt will be an amiodarone or Tikosyn candidate. He has  mod CAD and structural heart disease so will not use flecainide. After details given on both drugs, he feels that he would prefer amiodarone as he does not want to spend 4 days in the hospital. He does not have any significant lung disease, already is a pt of Tammy Parrett and Dr. Elsworth Soho mostly for treatment of sleep apnea, and I will ask if they need to do any additional testing/concerns with plans for amiodarone use. He does not have thyroid  or lung issues.  Today, he denies symptoms of palpitations, chest pain, shortness of breath, orthopnea, PND, lower extremity edema, dizziness, presyncope, syncope, or neurologic sequela. The patient is tolerating medications without difficulties and is otherwise without complaint today.   Past Medical History:  Diagnosis Date  . Abnormal CT scan, stomach 02/2014   Thickening of gastric fundus and cardia.  gastritis on EGD 03/2014  . Allergic rhinitis due to pollen   . Arthropathy, unspecified, site unspecified   . Atherosclerosis    a. Noted by abdominal CT 02/2014 (h/o normal nuc 2011).  . Atrial flutter ([place]) 02/24/14   s/p ablation 11/15  . Cardiomyopathy ([place])    tachy mediated - a. TEE (10/15):  EF 30%;  b. Echo after NSR restored (10/15):  mild LVH, EF 55-60%, mild AS, mild AI, mild MR, mild to mod LAE, mild RAE  . Diverticulosis    severe in descending, sigmoid colon.   . ED (erectile dysfunction)   . Esophageal stricture 03/2014   traversable with endoscope. not dilated.   . Gastric AVM   . GERD (gastroesophageal reflux disease) 03/2014   small HH and gastritis on EGD  . GI bleed   . Habitual alcohol use   . Hemorrhoids   . Hiatal hernia   . Hyperlipidemia   . Hypertension   . Obesity   . OSA on  CPAP   . PAF (paroxysmal atrial fibrillation) ([place])   . Prostatitis, unspecified   . 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 Skin cancer    basal and squamous cell   Past Surgical History:  Procedure Laterality Date  . ATRIAL FLUTTER ABLATION N/A 03/19/2014   RFCA of atrial flutter by Dr Caryl Comes  . CARDIOVERSION N/A 02/24/2014   Procedure: CARDIOVERSION;  Surgeon: Lelon Perla, MD;  Location: [place];  Service: Cardiovascular;  Laterality: N/A;  . CARDIOVERSION Right 09/10/2014   Procedure: CARDIOVERSION;  Surgeon: Deboraha Sprang, MD;  Location: [facility] CATH LAB;  Service: Cardiovascular;  Laterality: Right;  . CARDIOVERSION N/A 01/22/2018   Procedure:  CARDIOVERSION;  Surgeon: Fay Records, MD;  Location: [facility] ENDOSCOPY;  Service: Cardiovascular;  Laterality: N/A;  . COLONOSCOPY N/A 04/23/2015   Procedure: COLONOSCOPY;  Surgeon: Ladene Artist, MD;  Location: [facility] ENDOSCOPY;  Service: Endoscopy;  Laterality: N/A;  . ENTEROSCOPY N/A 06/12/2015   Procedure: ENTEROSCOPY;  Surgeon: Ladene Artist, MD;  Location: [facility] ENDOSCOPY;  Service: Endoscopy;  Laterality: N/A;  . EP IMPLANTABLE DEVICE N/A 02/15/2016   Procedure: Pacemaker Implant;  Surgeon: Evans Lance, MD;  Location: [place] CV LAB;  Service: Cardiovascular;  Laterality: N/A;  . ESOPHAGOGASTRODUODENOSCOPY N/A 04/09/2014   Procedure: ESOPHAGOGASTRODUODENOSCOPY (EGD);  Surgeon: Inda Castle, MD;  Location: [place];  Service: Endoscopy;  Laterality: N/A;  . ESOPHAGOGASTRODUODENOSCOPY N/A 04/23/2015   Procedure: ESOPHAGOGASTRODUODENOSCOPY (EGD);  Surgeon: Ladene Artist, MD;  Location: [facility] ENDOSCOPY;  Service: Endoscopy;  Laterality: N/A;  . INGUINAL HERNIA REPAIR     right  . INGUINAL HERNIA REPAIR     left  . LEFT HEART CATH AND CORONARY ANGIOGRAPHY N/A 03/16/2017   Procedure: LEFT HEART CATH AND CORONARY ANGIOGRAPHY;  Surgeon: Burnell Blanks, MD;  Location: [place] CV LAB;  Service: Cardiovascular;  Laterality: N/A;  . ORIF fracture of the elbow    . SKIN CANCER EXCISION  05/21/12   Squamous cell ca  . TEE WITHOUT CARDIOVERSION N/A 02/24/2014   Procedure: TRANSESOPHAGEAL ECHOCARDIOGRAM (TEE);  Surgeon: Lelon Perla, MD;  Location: [facility] ENDOSCOPY;  Service: Cardiovascular;  Laterality: N/A;  . TONSILLECTOMY      Current Outpatient Medications  Medication Sig Dispense Refill  . atorvastatin (LIPITOR) 40 MG tablet TAKE 1 TABLET ONCE EVERY EVENING 90 tablet 0  . diltiazem (CARDIZEM CD) 120 MG 24 hr capsule Take 1 capsule (120 mg total) by mouth 2 (two) times daily. 180 capsule 1  . ELIQUIS 5 MG TABS tablet TAKE 1 TABLET BY MOUTH TWICE DAILY. 180 tablet 3  . ferrous  sulfate 325 (65 FE) MG tablet Take 1 tablet (325 mg total) by mouth daily with breakfast. 60 tablet 3  . fluticasone (FLONASE) 50 MCG/ACT nasal spray 1 spray in each nostril daily as needed for stuffiness (Patient taking differently: Place 1 spray into both nostrils daily as needed for allergies. ) 16 g 11  . furosemide (LASIX) 20 MG tablet Take 20 mg by mouth daily.    . isosorbide mononitrate (IMDUR) 30 MG 24 hr tablet TAKE 1 TABLET BY MOUTH DAILY. 90 tablet 3  . MAGNESIUM PO Take 6 tablets by mouth at bedtime.     . metoprolol tartrate (LOPRESSOR) 50 MG tablet Take 25 mg by mouth 2 (two) times daily.    . metroNIDAZOLE (METROGEL) 0.75 % gel Apply 1 application topically daily as needed (for rosacea).     Marland Kitchen  Multiple Vitamins-Minerals (EYE VITAMINS) CAPS Take 1 capsule by mouth 2 (two) times daily.    . Multiple Vitamins-Minerals (MULTIVITAMIN PO) Take 1 tablet by mouth 2 (two) times daily.    . nitroGLYCERIN (NITROSTAT) 0.4 MG SL tablet PLACE 1 TABLET UNDER THE TONGUE EVERY 5 MINUTES AS NEEDED FOR CHEST PAIN, MAY REPEAT FOR 3 DOSES. (Patient taking differently: Place 0.4 mg under the tongue every 5 (five) minutes as needed for chest pain. ) 25 tablet 5  . pantoprazole (PROTONIX) 40 MG tablet Take 1 tablet (40 mg total) by mouth daily. 90 tablet 3   No current facility-administered medications for this encounter.     No Known Allergies  Social History   Socioeconomic History  . Marital status: Married    Spouse name: Not on file  . Number of children: 3  . Years of education: 16  . Highest education level: Not on file  Occupational History  . Occupation: Therapist, sports: RETIRED  Social Needs  . Financial resource strain: Not hard at all  . Food insecurity:    Worry: Never true    Inability: Never true  . Transportation needs:    Medical: No    Non-medical: No  Tobacco Use  . Smoking status: Former Smoker    Packs/day: 0.00    Years: 40.00    Pack years: 0.00      Types: Cigarettes, Cigars    Last attempt to quit: 09/18/2008    Years since quitting: 9.5  . Smokeless tobacco: Former Systems developer    Types: Chew  . Tobacco comment: Has Occasional Cigar/ quit   Substance and Sexual Activity  . Alcohol use: Yes    Alcohol/week: 28.0 standard drinks    Types: 21 Glasses of wine, 7 Shots of liquor per week    Comment: states he does want to drink less and cut back on the amount.   . Drug use: No  . Sexual activity: Not Currently  Lifestyle  . Physical activity:    Days per week: 4 days    Minutes per session: 50 min  . Stress: Not at all  Relationships  . Social connections:    Talks on phone: More than three times a week    Gets together: More than three times a week    Attends religious service: More than 4 times per year    Active member of club or organization: Yes    Attends meetings of clubs or organizations: More than 4 times per year    Relationship status: Married  . Intimate partner violence:    Fear of current or ex partner: No    Emotionally abused: No    Physically abused: No    Forced sexual activity: No  Other Topics Concern  . 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 married 1961. 2 sons- '64, '63 , I daughter- '66, 4 grandchildren. work: Museum/gallery curator, retired but still consults. 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      No new stressors/     Family History  Problem Relation Age of Onset  . Cancer Mother        ovarian  . Other Father        renal disease- grief over loss of spouse  . Cancer Brother        prostate- died of hematologic disorder 2nd  to chemo  . Pulmonary fibrosis Brother   . Diabetes Neg Hx   . Coronary artery disease Neg Hx   . Colon cancer Neg Hx   . Stomach cancer Neg Hx   . Rectal cancer Neg Hx     ROS- All systems are reviewed and negative except as per the HPI  above  Physical Exam: There were no vitals filed for this visit. Wt Readings from Last 3 Encounters:  04/04/18 100.5 kg  02/27/18 102.1 kg  02/26/18 102.5 kg    Labs: Lab Results  Component Value Date   NA 140 01/17/2018   K 4.5 01/17/2018   CL 107 01/17/2018   CO2 25 01/17/2018   GLUCOSE 106 (H) 01/17/2018   BUN 18 01/17/2018   CREATININE 1.17 01/17/2018   CALCIUM 9.4 01/17/2018   PHOS 2.7 02/14/2016   MG 2.0 02/14/2016   Lab Results  Component Value Date   INR 1.52 06/08/2017   Lab Results  Component Value Date   CHOL 107 06/08/2017   HDL 40.80 06/08/2017   LDLCALC 49 06/08/2017   TRIG 85.0 06/08/2017     GEN- The patient is well appearing, alert and oriented x 3 today.   Head- normocephalic, atraumatic Eyes-  Sclera clear, conjunctiva pink Ears- hearing intact Oropharynx- clear Neck- supple, no JVP Lymph- no cervical lymphadenopathy Lungs- Clear to ausculation bilaterally, normal work of breathing Heart- irregular rate and rhythm, no murmurs, rubs or gallops, PMI not laterally displaced GI- soft, NT, ND, + BS Extremities- no clubbing, cyanosis, or edema MS- no significant deformity or atrophy Skin- no rash or lesion Psych- euthymic mood, full affect Neuro- strength and sensation are intact  EKG- not done today    Assessment and Plan: 1. Atrial fib/ flutter Pt had unsuccessful cardioversion but went back in rhythm shortly after cardioversion in September He is now back in afib  Discussed in detail risk vrs benefit of Tikosyn vrs amiodarone  Pt would like to avoid hospitalization and go with amiodarone  Will plan for 200 mg bid for loading and then cardioversion I will send my note to Tammy Parrett to see if baseline studies are needed and if so,  can be arranged  in their office Continue eliquis 5 mg bid for CHA2DS2VASc score of 5 and reminded not to miss doses  Cmet/tsh today for baseline Will continue cardizem 120 mg bid and  metoprolol  25 mg  bid for better coverage He was advised to decrease alcohol use to help discourage return of afib/flutter to no more than 2 a week  Continue to use Cpap to treat  OSA  2. PPM Per Dr. Caryl Comes and device clinc  3. HTN  Stable   I will back in touch to start amiodarone after hearing back from pulmonology/baseline labs to start amiodarone   Butch Penny C. Louvenia Golomb, [facility] [address] [place], [place] [address] [phone]

## 2018-04-16 ENCOUNTER — Ambulatory Visit (INDEPENDENT_AMBULATORY_CARE_PROVIDER_SITE_OTHER): Payer: PPO

## 2018-04-16 ENCOUNTER — Telehealth: Payer: Self-pay

## 2018-04-16 ENCOUNTER — Telehealth: Payer: Self-pay | Admitting: Adult Health

## 2018-04-16 DIAGNOSIS — Z23 Encounter for immunization: Secondary | ICD-10-CM | POA: Diagnosis not present

## 2018-04-16 DIAGNOSIS — Z299 Encounter for prophylactic measures, unspecified: Secondary | ICD-10-CM

## 2018-04-16 NOTE — Telephone Encounter (Signed)
Please set up ov with me with PFT  Cardiology called and pt is going to be started on Amiodarone and needs this before starting .   Thanks  [place]

## 2018-04-16 NOTE — Telephone Encounter (Signed)
Patient agrees to PFT and OV with T. Parrett

## 2018-04-17 ENCOUNTER — Other Ambulatory Visit: Payer: Self-pay

## 2018-04-17 ENCOUNTER — Telehealth: Payer: Self-pay

## 2018-04-17 DIAGNOSIS — G4733 Obstructive sleep apnea (adult) (pediatric): Secondary | ICD-10-CM

## 2018-04-17 NOTE — Telephone Encounter (Signed)
PFT and OV appt changed.  Notified patient

## 2018-04-17 NOTE — Progress Notes (Unsigned)
t

## 2018-04-17 NOTE — Telephone Encounter (Signed)
appts for PFT and OV with T Parrett scheduled with patient for patient and confirmed 12/4 nd 04/19/2018

## 2018-04-18 ENCOUNTER — Ambulatory Visit (INDEPENDENT_AMBULATORY_CARE_PROVIDER_SITE_OTHER): Payer: PPO | Admitting: Pulmonary Disease

## 2018-04-18 DIAGNOSIS — G4733 Obstructive sleep apnea (adult) (pediatric): Secondary | ICD-10-CM | POA: Diagnosis not present

## 2018-04-18 LAB — PULMONARY FUNCTION TEST
DL/VA % pred: 76 %
DL/VA: 3.42 ml/min/mmHg/L
DLCO UNC: 17.94 ml/min/mmHg
DLCO unc % pred: 60 %
FEF 25-75 Post: 2.28 L/sec
FEF 25-75 Pre: 1.89 L/sec
FEF2575-%Change-Post: 20 %
FEF2575-%PRED-POST: 138 %
FEF2575-%Pred-Pre: 115 %
FEV1-%Change-Post: 3 %
FEV1-%PRED-PRE: 102 %
FEV1-%Pred-Post: 105 %
FEV1-POST: 2.63 L
FEV1-Pre: 2.54 L
FEV1FVC-%CHANGE-POST: 1 %
FEV1FVC-%PRED-PRE: 106 %
FEV6-%Change-Post: 2 %
FEV6-%Pred-Post: 103 %
FEV6-%Pred-Pre: 101 %
FEV6-Post: 3.42 L
FEV6-Pre: 3.34 L
FEV6FVC-%CHANGE-POST: 0 %
FEV6FVC-%PRED-POST: 107 %
FEV6FVC-%Pred-Pre: 107 %
FVC-%Change-Post: 1 %
FVC-%PRED-POST: 96 %
FVC-%PRED-PRE: 94 %
FVC-PRE: 3.37 L
FVC-Post: 3.43 L
POST FEV1/FVC RATIO: 77 %
PRE FEV1/FVC RATIO: 75 %
Post FEV6/FVC ratio: 100 %
Pre FEV6/FVC Ratio: 99 %
RV % pred: 108 %
RV: 2.83 L
TLC % PRED: 95 %
TLC: 6.37 L

## 2018-04-18 NOTE — Progress Notes (Signed)
PFT done today. 

## 2018-04-19 ENCOUNTER — Ambulatory Visit (INDEPENDENT_AMBULATORY_CARE_PROVIDER_SITE_OTHER): Payer: PPO | Admitting: Adult Health

## 2018-04-19 ENCOUNTER — Encounter: Payer: Self-pay | Admitting: Adult Health

## 2018-04-19 VITALS — BP 112/74 | HR 116 | Ht 68.0 in | Wt 218.0 lb

## 2018-04-19 DIAGNOSIS — I480 Paroxysmal atrial fibrillation: Secondary | ICD-10-CM

## 2018-04-19 DIAGNOSIS — J439 Emphysema, unspecified: Secondary | ICD-10-CM | POA: Diagnosis not present

## 2018-04-19 DIAGNOSIS — R942 Abnormal results of pulmonary function studies: Secondary | ICD-10-CM

## 2018-04-19 DIAGNOSIS — R0602 Shortness of breath: Secondary | ICD-10-CM

## 2018-04-19 NOTE — Patient Instructions (Signed)
Set up for HRCT Chest , if this okay will let Cardiology know to proceed with Amiodarone therapy per their instructions.   Continue on C Pap at bedtime.  Keep up good work .  Work on healthy weight .   Do not drive if sleepy.    Follow with Dr. Elsworth Soho or  NP in 1 year and As needed

## 2018-04-19 NOTE — Progress Notes (Addendum)
@Patient  ID: Donald [name], male    DOB: Apr 12, 1933, 84 y.o.   MRN: [id number]  Chief Complaint  Patient presents with  . Follow-up    PFT     Referring provider: Janith Lima, MD  HPI: 84 year old male former smoker followed for obstructive sleep apnea on CPAP Previous trial with dental appliance without perceived benefit Medical history significant for atrial fib, tachybradycardia syndrome status post pacemaker, anticoagulation  TEST /Events  Spirometry - FEV1 ratio of 75, with FEV1 of 77% and FVC of 77% suggesting mild restriction CT chest in 04/2013 showed mild emphysema PSG 04/2014 - 206 lbs-Moderate OSA-stopped breathing 24 times an hour.- corrected by C Pap 13 cm, large full facemask   04/19/2018 Follow up : PFT  Patient presents for a follow-up visit.  Patient has been having difficulty with recurrent A. fib despite cardioversion.  He has been evaluated by cardiology who want to begin amiodarone.  Patient remains on Eliquis anticoagulation.  Patient is a former smoker.  He says he is never been diagnosed with any breathing problems such as asthma or COPD.  He is not on any inhalers.  He says he is active denies any cough or wheezing.  Has been short of breath since his A. fib has been active. Says leg swelling is under control with Lasix. He is CT chest in 2014 showed mild emphysema.  Previous spirometry showed mild restriction with no airflow obstruction.  Patient had PFTs done today that showed normal lung function with FEV1 at 105%, ratio 77, FVC 96%, no significant bronchodilator response, DLCO decreased at 60%.  Patient is not anemic.  Has sleep apnea is on CPAP.  Says he is doing well with CPAP and feels rested. Is compliant.  No Known Allergies  Immunization History  Administered Date(s) Administered  . H1N1 04/28/2008  . Influenza Split 02/23/2011, 02/07/2012  . Influenza Whole 02/07/2008, 02/04/2009  . Influenza, High Dose Seasonal PF 01/20/2015,  03/07/2016, 02/20/2017, 02/20/2018  . Influenza,inj,Quad PF,6+ Mos 02/07/2013, 02/03/2014  . Pneumococcal Conjugate-13 05/07/2013  . Pneumococcal Polysaccharide-23 03/20/2007, 03/07/2016  . Td 04/11/2008  . Tdap 04/16/2018  . Zoster 01/27/2010  . Zoster Recombinat (Shingrix) 02/27/2018    Past Medical History:  Diagnosis Date  . Abnormal CT scan, stomach 02/2014   Thickening of gastric fundus and cardia.  gastritis on EGD 03/2014  . Allergic rhinitis due to pollen   . Arthropathy, unspecified, site unspecified   . Atherosclerosis    a. Noted by abdominal CT 02/2014 (h/o normal nuc 2011).  . Atrial flutter ([place]) 02/24/14   s/p ablation 11/15  . Cardiomyopathy ([place])    tachy mediated - a. TEE (10/15):  EF 30%;  b. Echo after NSR restored (10/15):  mild LVH, EF 55-60%, mild AS, mild AI, mild MR, mild to mod LAE, mild RAE  . Diverticulosis    severe in descending, sigmoid colon.   . ED (erectile dysfunction)   . Esophageal stricture 03/2014   traversable with endoscope. not dilated.   . Gastric AVM   . GERD (gastroesophageal reflux disease) 03/2014   small HH and gastritis on EGD  . GI bleed   . Habitual alcohol use   . Hemorrhoids   . Hiatal hernia   . Hyperlipidemia   . Hypertension   . Obesity   . OSA on CPAP   . PAF (paroxysmal atrial fibrillation) ([place])   . Prostatitis, unspecified   . Sinus bradycardia    a. s/p STJ dual  chamber PPM  . Skin cancer    basal and squamous cell    Tobacco History: Social History   Tobacco Use  Smoking Status Former Smoker  . Packs/day: 0.00  . Years: 40.00  . Pack years: 0.00  . Types: Cigarettes, Cigars  . Last attempt to quit: 09/18/2008  . Years since quitting: 9.5  Smokeless Tobacco Former Systems developer  . Types: Chew  Tobacco Comment   Has Occasional Cigar/ quit    Counseling given: Not Answered Comment: Has Occasional Cigar/ quit    Outpatient Medications Prior to Visit  Medication Sig Dispense Refill  . atorvastatin  (LIPITOR) 40 MG tablet TAKE 1 TABLET ONCE EVERY EVENING 90 tablet 0  . diltiazem (CARDIZEM CD) 120 MG 24 hr capsule Take 1 capsule (120 mg total) by mouth 2 (two) times daily. 180 capsule 1  . ELIQUIS 5 MG TABS tablet TAKE 1 TABLET BY MOUTH TWICE DAILY. 180 tablet 3  . ferrous sulfate 325 (65 FE) MG tablet Take 1 tablet (325 mg total) by mouth daily with breakfast. 60 tablet 3  . fluticasone (FLONASE) 50 MCG/ACT nasal spray 1 spray in each nostril daily as needed for stuffiness (Patient taking differently: Place 1 spray into both nostrils daily as needed for allergies. ) 16 g 11  . furosemide (LASIX) 20 MG tablet Take 20 mg by mouth daily.    . isosorbide mononitrate (IMDUR) 30 MG 24 hr tablet TAKE 1 TABLET BY MOUTH DAILY. 90 tablet 3  . MAGNESIUM PO Take 6 tablets by mouth at bedtime.     . metoprolol tartrate (LOPRESSOR) 50 MG tablet Take 25 mg by mouth 2 (two) times daily.    . metroNIDAZOLE (METROGEL) 0.75 % gel Apply 1 application topically daily as needed (for rosacea).     . Multiple Vitamins-Minerals (EYE VITAMINS) CAPS Take 1 capsule by mouth 2 (two) times daily.    . Multiple Vitamins-Minerals (MULTIVITAMIN PO) Take 1 tablet by mouth 2 (two) times daily.    . nitroGLYCERIN (NITROSTAT) 0.4 MG SL tablet PLACE 1 TABLET UNDER THE TONGUE EVERY 5 MINUTES AS NEEDED FOR CHEST PAIN, MAY REPEAT FOR 3 DOSES. (Patient taking differently: Place 0.4 mg under the tongue every 5 (five) minutes as needed for chest pain. ) 25 tablet 5  . pantoprazole (PROTONIX) 40 MG tablet Take 1 tablet (40 mg total) by mouth daily. 90 tablet 3   No facility-administered medications prior to visit.      Review of Systems  Constitutional:   No  weight loss, night sweats,  Fevers, chills, fatigue, or  lassitude.  HEENT:   No headaches,  Difficulty swallowing,  Tooth/dental problems, or  Sore throat,                No sneezing, itching, ear ache, nasal congestion, post nasal drip,   CV:  No chest pain,  Orthopnea,  PND, swelling in lower extremities, anasarca, dizziness, palpitations, syncope.   GI  No heartburn, indigestion, abdominal pain, nausea, vomiting, diarrhea, change in bowel habits, loss of appetite, bloody stools.   Resp:  No excess mucus, no productive cough,  No non-productive cough,  No coughing up of blood.  No change in color of mucus.  No wheezing.  No chest wall deformity  Skin: no rash or lesions.  GU: no dysuria, change in color of urine, no urgency or frequency.  No flank pain, no hematuria   MS:  No joint pain or swelling.  No decreased range of motion.  No back pain.    Physical Exam  BP 112/74 (BP Location: Left Arm, Cuff Size: Normal)   Pulse (!) 116   Ht 5\' 8"  (1.727 m)   Wt 218 lb (98.9 kg)   SpO2 95%   BMI 33.15 kg/m   GEN: A/Ox3; pleasant , NAD, well nourished    HEENT:  [place]/AT,  EACs-clear, TMs-wnl, NOSE-clear, THROAT-clear, no lesions, no postnasal drip or exudate noted.   NECK:  Supple w/ fair ROM; no JVD; normal carotid impulses w/o bruits; no thyromegaly or nodules palpated; no lymphadenopathy.    RESP  Clear  P & A; w/o, wheezes/ rales/ or rhonchi. no accessory muscle use, no dullness to percussion  CARD: Irregular  no m/r/g, tr  peripheral edema, pulses intact, no cyanosis or clubbing.  GI:   Soft & nt; nml bowel sounds; no organomegaly or masses detected.   Musco: Warm bil, no deformities or joint swelling noted.   Neuro: alert, no focal deficits noted.    Skin: Warm, no lesions or rashes    Lab Results:  CBC    Component Value Date/Time   WBC 8.8 01/17/2018 1055   RBC 4.31 01/17/2018 1055   HGB 14.3 01/17/2018 1055   HGB 14.6 01/02/2018 1622   HCT 42.3 01/17/2018 1055   HCT 43.5 01/02/2018 1622   PLT 180 01/17/2018 1055   PLT 215 01/02/2018 1622   MCV 98.1 01/17/2018 1055   MCV 99 (H) 01/02/2018 1622   MCH 33.2 01/17/2018 1055   MCHC 33.8 01/17/2018 1055   RDW 13.3 01/17/2018 1055   RDW 14.5 01/02/2018 1622   LYMPHSABS 1.6  06/08/2017 0927   LYMPHSABS 1.7 03/10/2017 0946   MONOABS 0.8 06/08/2017 0927   EOSABS 0.2 06/08/2017 0927   EOSABS 0.2 03/10/2017 0946   BASOSABS 0.0 06/08/2017 0927   BASOSABS 0.0 03/10/2017 0946    BMET    Component Value Date/Time   NA 138 04/11/2018 1451   NA 143 01/02/2018 1622   K 4.1 04/11/2018 1451   CL 107 04/11/2018 1451   CO2 25 04/11/2018 1451   GLUCOSE 110 (H) 04/11/2018 1451   BUN 20 04/11/2018 1451   BUN 23 01/02/2018 1622   CREATININE 1.14 04/11/2018 1451   CREATININE 1.04 06/10/2015 1038   CALCIUM 9.2 04/11/2018 1451   GFRNONAA 59 (L) 04/11/2018 1451   GFRAA >60 04/11/2018 1451    BNP    Component Value Date/Time   BNP 128.1 (H) 02/14/2016 0753    ProBNP    Component Value Date/Time   PROBNP 265.0 (H) 06/08/2017 0927    Imaging: No results found.    PFT Results Latest Ref Rng & Units 04/18/2018  FVC-Pre L 3.37  FVC-Predicted Pre % 94  FVC-Post L 3.43  FVC-Predicted Post % 96  Pre FEV1/FVC % % 75  Post FEV1/FCV % % 77  FEV1-Pre L 2.54  FEV1-Predicted Pre % 102  FEV1-Post L 2.63  DLCO UNC% % 60  DLCO COR %Predicted % 76  TLC L 6.37  TLC % Predicted % 95  RV % Predicted % 108    No results found for: NITRICOXIDE      Assessment & Plan:   Emphysema lung (HCC) Mild emphysema noted previously on CT chest in 2014.  PFTs today showed normal lung function with no airflow restriction or obstruction.  DLCO is decreased at 60%.  Patient is not anemic.  Considering the patient is getting ready to start on amiodarone would consider checking a  high-resolution CT chest to make sure no other etiology to explain low DLCO such as ILD is present.  Especially if going to be good starting amiodarone.  PAF (paroxysmal atrial fibrillation) (HCC) Recurrent A. fib despite cardioversion currently following with cardiology.  Cardiology is planning to begin amiodarone therapy.  PFTs were done today to evaluate underlying lung function as patient has been a  previous smoker.  He has no evidence of any COPD as PFT showed normal lung function with no airflow obstruction or restriction.  He does have a diffusing defect questionable etiology.  Could be secondary to his underlying emphysema.  Which was noted to be mild in 2014.  Will check a high-resolution CT chest to make sure no other etiology is present such as ILD.   If CT chest is negative for ILD could consider starting on amiodarone per cardiology's recommendation.  Considering patient's age and underlying mild emphysema would use the lowest dose possible of amiodarone.  Monitor spirometry with DLCO at least yearly.     Rexene Edison, NP 04/19/2018

## 2018-04-19 NOTE — Assessment & Plan Note (Signed)
Recurrent A. fib despite cardioversion currently following with cardiology.  Cardiology is planning to begin amiodarone therapy.  PFTs were done today to evaluate underlying lung function as patient has been a previous smoker.  He has no evidence of any COPD as PFT showed normal lung function with no airflow obstruction or restriction.  He does have a diffusing defect questionable etiology.  Could be secondary to his underlying emphysema.  Which was noted to be mild in 2014.  Will check a high-resolution CT chest to make sure no other etiology is present such as ILD.   If CT chest is negative for ILD could consider starting on amiodarone per cardiology's recommendation.  Considering patient's age and underlying mild emphysema would use the lowest dose possible of amiodarone.  Monitor spirometry with DLCO at least yearly.

## 2018-04-19 NOTE — Assessment & Plan Note (Signed)
Mild emphysema noted previously on CT chest in 2014.  PFTs today showed normal lung function with no airflow restriction or obstruction.  DLCO is decreased at 60%.  Patient is not anemic.  Considering the patient is getting ready to start on amiodarone would consider checking a high-resolution CT chest to make sure no other etiology to explain low DLCO such as ILD is present.  Especially if going to be good starting amiodarone.

## 2018-04-23 ENCOUNTER — Ambulatory Visit (INDEPENDENT_AMBULATORY_CARE_PROVIDER_SITE_OTHER)
Admission: RE | Admit: 2018-04-23 | Discharge: 2018-04-23 | Disposition: A | Discharge status: Home / Self Care | Payer: PPO | Source: Ambulatory Visit | Attending: Adult Health | Admitting: Adult Health

## 2018-04-23 DIAGNOSIS — J439 Emphysema, unspecified: Secondary | ICD-10-CM | POA: Diagnosis not present

## 2018-04-23 DIAGNOSIS — R0602 Shortness of breath: Secondary | ICD-10-CM

## 2018-04-23 DIAGNOSIS — R942 Abnormal results of pulmonary function studies: Secondary | ICD-10-CM | POA: Diagnosis not present

## 2018-04-30 MED FILL — SHINGRIX 50 MCG SUS: 50 | 1 days supply | Qty: 1 | Fill #1

## 2018-05-01 ENCOUNTER — Ambulatory Visit (HOSPITAL_COMMUNITY)
Admission: RE | Admit: 2018-05-01 | Discharge: 2018-05-01 | Disposition: A | Discharge status: Home / Self Care | Payer: PPO | Source: Ambulatory Visit | Attending: Nurse Practitioner | Admitting: Nurse Practitioner

## 2018-05-01 ENCOUNTER — Encounter (HOSPITAL_COMMUNITY): Payer: Self-pay | Admitting: Nurse Practitioner

## 2018-05-01 ENCOUNTER — Telehealth: Payer: Self-pay | Admitting: Pharmacist

## 2018-05-01 VITALS — BP 104/66 | HR 131 | Ht 68.0 in | Wt 220.0 lb

## 2018-05-01 DIAGNOSIS — R000 Tachycardia, unspecified: Secondary | ICD-10-CM | POA: Insufficient documentation

## 2018-05-01 DIAGNOSIS — Z6833 Body mass index (BMI) 33.0-33.9, adult: Secondary | ICD-10-CM | POA: Diagnosis not present

## 2018-05-01 DIAGNOSIS — Z7901 Long term (current) use of anticoagulants: Secondary | ICD-10-CM | POA: Diagnosis not present

## 2018-05-01 DIAGNOSIS — I10 Essential (primary) hypertension: Secondary | ICD-10-CM | POA: Insufficient documentation

## 2018-05-01 DIAGNOSIS — I4819 Other persistent atrial fibrillation: Secondary | ICD-10-CM | POA: Diagnosis not present

## 2018-05-01 DIAGNOSIS — Z9989 Dependence on other enabling machines and devices: Secondary | ICD-10-CM | POA: Insufficient documentation

## 2018-05-01 DIAGNOSIS — K219 Gastro-esophageal reflux disease without esophagitis: Secondary | ICD-10-CM | POA: Diagnosis not present

## 2018-05-01 DIAGNOSIS — Z9889 Other specified postprocedural states: Secondary | ICD-10-CM | POA: Insufficient documentation

## 2018-05-01 DIAGNOSIS — I480 Paroxysmal atrial fibrillation: Secondary | ICD-10-CM | POA: Diagnosis not present

## 2018-05-01 DIAGNOSIS — E785 Hyperlipidemia, unspecified: Secondary | ICD-10-CM | POA: Insufficient documentation

## 2018-05-01 DIAGNOSIS — E669 Obesity, unspecified: Secondary | ICD-10-CM | POA: Insufficient documentation

## 2018-05-01 DIAGNOSIS — I4892 Unspecified atrial flutter: Secondary | ICD-10-CM | POA: Diagnosis not present

## 2018-05-01 DIAGNOSIS — G4733 Obstructive sleep apnea (adult) (pediatric): Secondary | ICD-10-CM | POA: Diagnosis not present

## 2018-05-01 DIAGNOSIS — Z79899 Other long term (current) drug therapy: Secondary | ICD-10-CM | POA: Diagnosis not present

## 2018-05-01 DIAGNOSIS — Z87891 Personal history of nicotine dependence: Secondary | ICD-10-CM | POA: Diagnosis not present

## 2018-05-01 NOTE — Progress Notes (Signed)
Primary Care Physician: Janith Lima, MD Referring Physician: Dr. Jerrell Belfast ALGIE WESTRY is a 84 y.o. male with a h/o atrial flutter,afib, PPM, for tachy/brady syndrome, s/p ablation, OSA on cpap, alcohol use, cardiomyopathy, that was set up for cardioversion by Dr. Caryl Comes, but reported to the endo group that he had missed a dose of Eliquis, so DCCV was cancelled. He is now back in the afib clinic to get set up for cardioversion. He will be eligible for cardioversion the first of the week, not have missing any anticoagulation x 3 weeks. He remains in atrial flutter with RVR.  F/u from unsuccessful cardioversion, 9/9.He reports that his heart rate became regular and in the 60's 2 hours after cardioversion and he has been staying in SR since then. I discussed with Dr. Caryl Comes and instead of starting antiarrythmic, will see if pt continues in SR for now since he has not had frequent breakthrough episodes in the past. He feels improved.  F/u in afib clinic, 11/21. He went back into afib  last night around 5 pm. He continues in afib with RVR at 114 bpm, but in the office on pulse ox he is running around 80-90's at rest. He has reduced his nightly alcohol from 4 drinks a night but is still  drinking 2 drinks nightly. I believe he will  need an antiarrythmic and will confer with Dr. Caryl Comes which he drug he  would like to use. He  is using cpap.  F/u in afib clinic, 11/27. After discussion with Dr. Caryl Comes he feels that pt will be an amiodarone or Tikosyn candidate. He has  mod CAD and structural heart disease so will not use flecainide. After details given on both drugs, he feels that he would prefer amiodarone as he does not want to spend 4 days in the hospital. He does not have any significant lung disease, already is a pt of Tammy Parrett and Dr. Elsworth Soho mostly for treatment of sleep apnea, and I will ask if they need to do any additional testing/concerns with plans for amiodarone use. He does not have thyroid  or lung issues.  F/u in afib clinic, his lung CT showed possible early findings of ILD. Tammy Parrett, did not favor use of amiodarone, if so, would have to have lung testing every 6 months. He is now ready to listen to her advice  and would like to come  in for Tikosyn after the first of the year. He feels he can afford drug.   Today, he denies symptoms of palpitations, chest pain, shortness of breath, orthopnea, PND, lower extremity edema, dizziness, presyncope, syncope, or neurologic sequela. The patient is tolerating medications without difficulties and is otherwise without complaint today.   Past Medical History:  Diagnosis Date  . Abnormal CT scan, stomach 02/2014   Thickening of gastric fundus and cardia.  gastritis on EGD 03/2014  . Allergic rhinitis due to pollen   . Arthropathy, unspecified, site unspecified   . Atherosclerosis    a. Noted by abdominal CT 02/2014 (h/o normal nuc 2011).  . Atrial flutter ([place]) 02/24/14   s/p ablation 11/15  . Cardiomyopathy ([place])    tachy mediated - a. TEE (10/15):  EF 30%;  b. Echo after NSR restored (10/15):  mild LVH, EF 55-60%, mild AS, mild AI, mild MR, mild to mod LAE, mild RAE  . Diverticulosis    severe in descending, sigmoid colon.   . ED (erectile dysfunction)   . Esophageal stricture  03/2014   traversable with endoscope. not dilated.   . Gastric AVM   . GERD (gastroesophageal reflux disease) 03/2014   small HH and gastritis on EGD  . GI bleed   . Habitual alcohol use   . Hemorrhoids   . Hiatal hernia   . Hyperlipidemia   . Hypertension   . Obesity   . OSA on CPAP   . PAF (paroxysmal atrial fibrillation) ([place])   . Prostatitis, unspecified   . 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 Skin cancer    basal and squamous cell   Past Surgical History:  Procedure Laterality Date  . ATRIAL FLUTTER ABLATION N/A 03/19/2014   RFCA of atrial flutter by Dr Caryl Comes  . CARDIOVERSION N/A 02/24/2014   Procedure: CARDIOVERSION;   Surgeon: Lelon Perla, MD;  Location: [facility];  Service: Cardiovascular;  Laterality: N/A;  . CARDIOVERSION Right 09/10/2014   Procedure: CARDIOVERSION;  Surgeon: Deboraha Sprang, MD;  Location[facility] CATH LAB;  Service: Cardiovascular;  Laterality: Right;  . CARDIOVERSION N/A 01/22/2018   Procedure: CARDIOVERSION;  Surgeon: Fay Records, MD;  Location: [facility] ENDOSCOPY;  Service: Cardiovascular;  Laterality: N/A;  . COLONOSCOPY N/A 04/23/2015   Procedure: COLONOSCOPY;  Surgeon: Ladene Artist, MD;  Location: [facility] ENDOSCOPY;  Service: Endoscopy;  Laterality: N/A;  . ENTEROSCOPY N/A 06/12/2015   Procedure: ENTEROSCOPY;  Surgeon: Ladene Artist, MD;  Location: [facility] ENDOSCOPY;  Service: Endoscopy;  Laterality: N/A;  . EP IMPLANTABLE DEVICE N/A 02/15/2016   Procedure: Pacemaker Implant;  Surgeon: Evans Lance, MD;  Location: [place] CV LAB;  Service: Cardiovascular;  Laterality: N/A;  . ESOPHAGOGASTRODUODENOSCOPY N/A 04/09/2014   Procedure: ESOPHAGOGASTRODUODENOSCOPY (EGD);  Surgeon: Inda Castle, MD;  Location: [place];  Service: Endoscopy;  Laterality: N/A;  . ESOPHAGOGASTRODUODENOSCOPY N/A 04/23/2015   Procedure: ESOPHAGOGASTRODUODENOSCOPY (EGD);  Surgeon: Ladene Artist, MD;  Location: [facility] ENDOSCOPY;  Service: Endoscopy;  Laterality: N/A;  . INGUINAL HERNIA REPAIR     right  . INGUINAL HERNIA REPAIR     left  . LEFT HEART CATH AND CORONARY ANGIOGRAPHY N/A 03/16/2017   Procedure: LEFT HEART CATH AND CORONARY ANGIOGRAPHY;  Surgeon: Burnell Blanks, MD;  Location: [place] CV LAB;  Service: Cardiovascular;  Laterality: N/A;  . ORIF fracture of the elbow    . SKIN CANCER EXCISION  05/21/12   Squamous cell ca  . TEE WITHOUT CARDIOVERSION N/A 02/24/2014   Procedure: TRANSESOPHAGEAL ECHOCARDIOGRAM (TEE);  Surgeon: Lelon Perla, MD;  Location: [facility] ENDOSCOPY;  Service: Cardiovascular;  Laterality: N/A;  . TONSILLECTOMY      Current Outpatient Medications  Medication Sig  Dispense Refill  . atorvastatin (LIPITOR) 40 MG tablet TAKE 1 TABLET ONCE EVERY EVENING 90 tablet 0  . diltiazem (CARDIZEM CD) 120 MG 24 hr capsule Take 1 capsule (120 mg total) by mouth 2 (two) times daily. 180 capsule 1  . ELIQUIS 5 MG TABS tablet TAKE 1 TABLET BY MOUTH TWICE DAILY. 180 tablet 3  . ferrous sulfate 325 (65 FE) MG tablet Take 1 tablet (325 mg total) by mouth daily with breakfast. 60 tablet 3  . furosemide (LASIX) 20 MG tablet Take 20 mg by mouth daily.    . isosorbide mononitrate (IMDUR) 30 MG 24 hr tablet TAKE 1 TABLET BY MOUTH DAILY. 90 tablet 3  . Magnesium 65 MG TABS Take 6 tablets by mouth at bedtime.    . metoprolol tartrate (LOPRESSOR) 50 MG tablet  Take 25 mg by mouth 2 (two) times daily.    . metroNIDAZOLE (METROGEL) 0.75 % gel Apply 1 application topically daily as needed (for rosacea).     . Multiple Vitamins-Minerals (EYE VITAMINS) CAPS Take 1 capsule by mouth 2 (two) times daily.    . Multiple Vitamins-Minerals (MULTIVITAMIN PO) Take 1 tablet by mouth 2 (two) times daily.    . nitroGLYCERIN (NITROSTAT) 0.4 MG SL tablet PLACE 1 TABLET UNDER THE TONGUE EVERY 5 MINUTES AS NEEDED FOR CHEST PAIN, MAY REPEAT FOR 3 DOSES. (Patient taking differently: Place 0.4 mg under the tongue every 5 (five) minutes as needed for chest pain. ) 25 tablet 5  . pantoprazole (PROTONIX) 40 MG tablet Take 1 tablet (40 mg total) by mouth daily. 90 tablet 3   No current facility-administered medications for this encounter.     No Known Allergies  Social History   Socioeconomic History  . Marital status: Married    Spouse name: Not on file  . Number of children: 3  . Years of education: 67  . Highest education level: Not on file  Occupational History  . Occupation: Therapist, sports: RETIRED  Social Needs  . Financial resource strain: Not hard at all  . Food insecurity:    Worry: Never true    Inability: Never true  . Transportation needs:    Medical: No     Non-medical: No  Tobacco Use  . Smoking status: Former Smoker    Packs/day: 0.00    Years: 40.00    Pack years: 0.00    Types: Cigarettes, Cigars    Last attempt to quit: 09/18/2008    Years since quitting: 9.6  . Smokeless tobacco: Former Systems developer    Types: Chew  . Tobacco comment: Has Occasional Cigar/ quit   Substance and Sexual Activity  . Alcohol use: Yes    Alcohol/week: 28.0 standard drinks    Types: 21 Glasses of wine, 7 Shots of liquor per week    Comment: states he does want to drink less and cut back on the amount.   . Drug use: No  . Sexual activity: Not Currently  Lifestyle  . Physical activity:    Days per week: 4 days    Minutes per session: 50 min  . Stress: Not at all  Relationships  . Social connections:    Talks on phone: More than three times a week    Gets together: More than three times a week    Attends religious service: More than 4 times per year    Active member of club or organization: Yes    Attends meetings of clubs or organizations: More than 4 times per year    Relationship status: Married  . Intimate partner violence:    Fear of current or ex partner: No    Emotionally abused: No    Physically abused: No    Forced sexual activity: No  Other Topics Concern  . Not on file  Social History Narrative   HSG, [employer]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 married 1961. 2 sons- '64, '63 , I daughter- '66, 4 grandchildren. work: Museum/gallery curator, retired but still consults. 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      No new stressors/     Family History  Problem Relation Age of Onset  . Cancer Mother        ovarian  . Other Father  renal disease- grief over loss of spouse  . Cancer Brother        prostate- died of hematologic disorder 2nd to chemo  . Pulmonary fibrosis Brother   . Diabetes Neg Hx   . Coronary artery disease Neg Hx   . Colon cancer Neg Hx   . Stomach  cancer Neg Hx   . Rectal cancer Neg Hx     ROS- All systems are reviewed and negative except as per the HPI above  Physical Exam: Vitals:   05/01/18 0957  BP: 104/66  Pulse: (!) 131  Weight: 99.8 kg  Height: 5\' 8"  (1.727 m)   Wt Readings from Last 3 Encounters:  05/01/18 99.8 kg  04/19/18 98.9 kg  04/04/18 100.5 kg    Labs: Lab Results  Component Value Date   NA 138 04/11/2018   K 4.1 04/11/2018   CL 107 04/11/2018   CO2 25 04/11/2018   GLUCOSE 110 (H) 04/11/2018   BUN 20 04/11/2018   CREATININE 1.14 04/11/2018   CALCIUM 9.2 04/11/2018   PHOS 2.7 02/14/2016   MG 2.0 02/14/2016   Lab Results  Component Value Date   INR 1.52 06/08/2017   Lab Results  Component Value Date   CHOL 107 06/08/2017   HDL 40.80 06/08/2017   LDLCALC 49 06/08/2017   TRIG 85.0 06/08/2017     GEN- The patient is well appearing, alert and oriented x 3 today.   Head- normocephalic, atraumatic Eyes-  Sclera clear, conjunctiva pink Ears- hearing intact Oropharynx- clear Neck- supple, no JVP Lymph- no cervical lymphadenopathy Lungs- Clear to ausculation bilaterally, normal work of breathing Heart- irregular rate and rhythm, no murmurs, rubs or gallops, PMI not laterally displaced GI- soft, NT, ND, + BS Extremities- no clubbing, cyanosis, or edema MS- no significant deformity or atrophy Skin- no rash or lesion Psych- euthymic mood, full affect Neuro- strength and sensation are intact  EKG- atrial flutter at 131 bpm, PR int 118 bpm, qrs nt 90 ms, qtc 457 ms(424 ms in SR in February 2019) Chest CT-IMPRESSION: 1. There are subtle changes in the lungs, which could be indicative of interstitial lung disease. At this point, findings are classified as indeterminate for usual interstitial pneumonia (UIP) per current ATS guidelines. 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 If there's persistent clinical concern for interstitial lung disease, repeat  high-resolution chest CT is recommended in 12 months to assess for temporal changes in the appearance of the lung parenchyma. 2. Mild diffuse bronchial wall thickening with mild centrilobular and paraseptal emphysema; imaging findings suggestive of underlying COPD. 3. Aortic atherosclerosis, in addition to left main and 3 vessel coronary artery disease(known CAD with prior LHC). 4. Left atrial enlargement. 5. There are calcifications of the aortic valve. Echocardiographic correlation for evaluation of potential valvular dysfunction may be warranted if clinically indicated.Will need Echo when pt is back in SR to further assess AV.   Assessment and Plan: 1. Atrial fib/ flutter Pt had unsuccessful cardioversion but went back in rhythm shortly after cardioversion in September He is now back in afib  Discussed in detail risk vrs benefit of Tikosyn vrs amiodarone  Pt wanted  to avoid hospitalization and go with amiodarone, however, chest CT, showed possible early ILD so pt is now willing to pursue tikosyn  Continue eliquis 5 mg bid for CHA2DS2VASc score of 5 and reminded not to miss doses  Will continue cardizem 120 mg bid and  metoprolol  25 mg bid  for better coverage He was advised to decrease alcohol use to help discourage return of afib/flutter to no more than 2 a week  Continue to use Cpap to treat  OSA He will need f/u echo when in SR to further evaluate severe calcification of the aortic valve  2. PPM Per Dr. Caryl Comes and device clinc  3. HTN  Stable  He wishes to wait for hospitalization until 1/13  Geroge Baseman. Delbert Vu, [facility] [address] [place], [place] [address] [phone]

## 2018-05-01 NOTE — Telephone Encounter (Signed)
Medication list reviewed in anticipation of upcoming Tikosyn initiation. Patient is not taking any contraindicated or QTc prolonging medications.   Patient is anticoagulated on Eliquis 5mg BID on the appropriate dose. Please ensure that patient has not missed any anticoagulation doses in the 3 weeks prior to Tikosyn initiation.   Patient will need to be counseled to avoid use of Benadryl while on Tikosyn and in the 2-3 days prior to Tikosyn initiation. 

## 2018-05-04 ENCOUNTER — Other Ambulatory Visit (HOSPITAL_COMMUNITY): Payer: Self-pay | Admitting: Nurse Practitioner

## 2018-05-04 ENCOUNTER — Ambulatory Visit: Payer: PPO | Admitting: Adult Health

## 2018-05-11 DIAGNOSIS — Z961 Presence of intraocular lens: Secondary | ICD-10-CM | POA: Diagnosis not present

## 2018-05-11 DIAGNOSIS — H353132 Nonexudative age-related macular degeneration, bilateral, intermediate dry stage: Secondary | ICD-10-CM | POA: Diagnosis not present

## 2018-05-11 DIAGNOSIS — H35033 Hypertensive retinopathy, bilateral: Secondary | ICD-10-CM | POA: Diagnosis not present

## 2018-05-11 DIAGNOSIS — H35342 Macular cyst, hole, or pseudohole, left eye: Secondary | ICD-10-CM | POA: Diagnosis not present

## 2018-05-22 ENCOUNTER — Ambulatory Visit (INDEPENDENT_AMBULATORY_CARE_PROVIDER_SITE_OTHER): Payer: PPO

## 2018-05-22 DIAGNOSIS — I495 Sick sinus syndrome: Secondary | ICD-10-CM

## 2018-05-23 NOTE — Progress Notes (Signed)
Remote pacemaker transmission.   

## 2018-05-24 ENCOUNTER — Encounter: Payer: Self-pay | Admitting: Internal Medicine

## 2018-05-24 ENCOUNTER — Ambulatory Visit (INDEPENDENT_AMBULATORY_CARE_PROVIDER_SITE_OTHER): Payer: PPO | Admitting: Internal Medicine

## 2018-05-24 VITALS — BP 114/62 | HR 130 | Ht 68.0 in | Wt 222.6 lb

## 2018-05-24 DIAGNOSIS — J849 Interstitial pulmonary disease, unspecified: Secondary | ICD-10-CM | POA: Diagnosis not present

## 2018-05-24 DIAGNOSIS — Z836 Family history of other diseases of the respiratory system: Secondary | ICD-10-CM

## 2018-05-24 LAB — CUP PACEART REMOTE DEVICE CHECK
Battery Remaining Longevity: 107 mo
Battery Remaining Percentage: 95.5 %
Battery Voltage: 3.01 V
Brady Statistic AP VP Percent: 1 %
Brady Statistic AP VS Percent: 97 %
Brady Statistic RA Percent Paced: 57 %
Brady Statistic RV Percent Paced: 4.1 %
Date Time Interrogation Session: 20200106075357
Implantable Lead Implant Date: 20171002
Implantable Lead Location: 753859
Implantable Lead Location: 753860
Implantable Pulse Generator Implant Date: 20171002
Lead Channel Impedance Value: 410 Ohm
Lead Channel Pacing Threshold Amplitude: 0.75 V
Lead Channel Pacing Threshold Pulse Width: 0.5 ms
Lead Channel Sensing Intrinsic Amplitude: 3.6 mV
Lead Channel Setting Sensing Sensitivity: 2 mV
MDC IDC LEAD IMPLANT DT: 20171002
MDC IDC MSMT LEADCHNL RV IMPEDANCE VALUE: 560 Ohm
MDC IDC MSMT LEADCHNL RV PACING THRESHOLD AMPLITUDE: 0.5 V
MDC IDC MSMT LEADCHNL RV PACING THRESHOLD PULSEWIDTH: 0.5 ms
MDC IDC MSMT LEADCHNL RV SENSING INTR AMPL: 12 mV
MDC IDC SET LEADCHNL RA PACING AMPLITUDE: 2 V
MDC IDC SET LEADCHNL RV PACING AMPLITUDE: 2.5 V
MDC IDC SET LEADCHNL RV PACING PULSEWIDTH: 0.5 ms
MDC IDC STAT BRADY AS VP PERCENT: 1 %
MDC IDC STAT BRADY AS VS PERCENT: 1.9 %
Pulse Gen Model: 2272
Pulse Gen Serial Number: 7951215

## 2018-05-24 LAB — SEDIMENTATION RATE: Sed Rate: 9 mm/hr (ref 0–20)

## 2018-05-24 NOTE — Addendum Note (Signed)
Addended by: Suzzanne Cloud E on: 05/24/2018 11:22 AM   Modules accepted: Orders

## 2018-05-24 NOTE — Patient Instructions (Addendum)
ICD-10-CM   1. ILD (interstitial lung disease) ([place]) J84.9   2. Family history of pulmonary fibrosis Z83.6      - You  have Interstitial Lung Disease (ILD) -This appears new since 2014 CT chest in June 2017 CT abdomen lung cuts  -  There are > 100 varieties of this but based on age, family history, smoking history, work history, acid reflux history, male gender and caucasian suspect IPF. However, with down pillow use suspect chronic hypersensitivity pneumonitis  PLAN - No amiodarone for  A Fib - Throw away all down pillow and duvet use completely out of the house - To further narrow down possibilities and assess severity please do the following tests   - do autoimmune panel: Serum: ESR, ANA, DS-DNA, RF, anti-CCP, ssA, ssB, scl-70, ANCA, MPO and PR-3 antibodies, Total CK,  Aldolase,  Hypersensitivity Pneumonitis Panel  Followup - return in 6 weeks to ILD clinic to discuss results - Based on results and starting of tikosyn we can decide next step  - if aftrer blood work ILD is undifferentiated we need to discuss biopsy that can range from just lavage with bronchoscopy to transbronchial biopsy (ENVISIA test) to surgical lung biopsy. At this point, need to have discussion with Dr Caryl Comes about cardiac risk and stopping eliquis for procedure

## 2018-05-24 NOTE — Progress Notes (Addendum)
02/26/2018 Follow up : OSA  85 year old male former smoker followed for obstructive sleep apnea on CPAP Previous trial with dental appliance without perceived benefit Medical history significant for atrial fib, tachybradycardia syndrome status post pacemaker, anticoagulation   Patient presents for a one-year follow-up for sleep apnea.  Patient has underlying moderate sleep apnea he uses CPAP at bedtime.  Patient says he feels rested with no significant daytime sleepiness.  He wears his CPAP every night without any missed nights.  Patient is on CPAP 13 7 was H2O.  AHI 5.3.  Average usage at 8 hours.  Been having trouble with A Fib had recent cardioversion last month, remains on in NSR.  Remains on Eliquis .   Flu shot is utd.   Does water aerobic most days during the week. Discussed healthy weight and diet.   04/19/2018 Follow up : PFT  Patient presents for a follow-up visit.  Patient has been having difficulty with recurrent A. fib despite cardioversion.  He has been evaluated by cardiology who want to begin amiodarone.  Patient remains on Eliquis anticoagulation.  Patient is a former smoker.  He says he is never been diagnosed with any breathing problems such as asthma or COPD.  He is not on any inhalers.  He says he is active denies any cough or wheezing.  Has been short of breath since his A. fib has been active. Says leg swelling is under control with Lasix. He is CT chest in 2014 showed mild emphysema.  Previous spirometry showed mild restriction with no airflow obstruction.  Patient had PFTs done today that showed normal lung function with FEV1 at 105%, ratio 77, FVC 96%, no significant bronchodilator response, DLCO decreased at 60%.  Patient is not anemic.  Has sleep apnea is on CPAP.  Says he is doing well with CPAP and feels rested. Is compliant.  TEST [facility]a [facility]  Spirometry - FEV1 ratio of 75, with FEV1 of 77% and FVC of 77% suggesting mild restriction CT chest in 04/2013  showed mild emphysema PSG 04/2014 - 206 lbs-Moderate OSA-stopped breathing 24 times an hour.- corrected by C Pap 13 cm, large full facemask    OV 05/24/2018  Subjective:  Patient ID: [name], male , DOB: [birth year] , age 71 y.o. , MRN: [id number] , ADDRESS: [place] [id number]   05/24/2018 -   Chief Complaint  Patient presents with  . Follow-up    F/u after HRCT.Sob-same,cough occass. white,denies cp or tightness. Wearing CPAP had leakage spoke to [facility] for new mask.     HPI DIANDRE MERICA 85 y.o. -referred by Tammy parent nurse practitioner and electrophysiology/cardiology services for evaluation of interstitial lung disease.  He was diagnosed with atrial fibrillation that did not respond to cardioversion.  Therefore amiodarone versus Tikosyn was being elevated.  He chose amiodarone.  A pulmonary function test was done in December 2019 that showed isolated reduction in diffusion capacity to 60%.  Therefore he was referred to pulmonary clinic.  A high-resolution CT chest was performed on April 23, 2018 that I personally visualized and agree with Dr. Weber Cooks that this is indeterminate for UIP [less than 50% probability that this is pathologically and clinically UIP].  Therefore has been referred to the pulmonary fibrosis ILD clinic.  [facility] Integrated Comprehensive ILD Questionnaire  Symptoms: Insidious onset of shortness of breath.  Same since it started.  Going on for few to several years.  No episodic shortness of breath.  Level  2 dyspnea on walking up stairs or walking up a hill.  But no dyspnea for rest or doing ADLs or shopping or walking at his own pace.  In fact had a cardiac stress test treadmill in July 24, 2016 and this was reported as normal response to exercise.  He does not have a cough.  Other than early in the morning.   Past Medical History : Positive for significant acid reflux and hiatal hernia.  Negative for collagen vascular disease.  He  does have sleep apnea for the last several years and is using CPAP.  Does not have diabetes or stroke or seizures or hepatitis.  He does have atrial fibrillation for the last 3 years.   ROS: Negative for fatigue arthralgia dysphagia or [place] or recurrent fever or weight loss.  Does admit to snoring and heartburn.   FAMILY HISTORY of LUNG DISEASE: Brother died in 2004-07-24 at the age of 85 in [place] at [facility] with IPF.  Otherwise negative for lung disease   EXPOSURE HISTORY: Positive for cigarette smoking since [birth year] the year 1999/07/25 pack a day.  Denies smoking any marijuana other than between [place] once a month.  Does not use cocaine never used cocaine no intravenous drug use   HOME and HOBBY DETAILS : [place] 2 [place].  Age of the home is 10 years.  Never lived in a damp environment.  Does use a steam iron and does use a CPAP but he denies any mold in it.  All his life he has been using down pillow and feather duvet blanket.  He does do gardening but he did not answer about damp soil exposure.   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  During this time he would have brief exposure to these exposures.  He denies any mold during his air conditioning work.  He does admit to gardening and cotton production exposure.  Otherwise questionnaire is negative   PULMONARY TOXICITY HISTORY (27 items): Denies     Simple office walk 250 feet x  3 laps goal with forehead probe 05/24/2018   O2 used ra  Number laps completed 3  Comments about pace normal  Resting Pulse Ox/HR 97% and 130/min  Final Pulse Ox/HR 96% and 128/min  Desaturated </= 88% no  Desaturated <= 3% points yes  Got Tachycardic >/= 90/min y3s  Symptoms at end of test none  Miscellaneous comments Tachy at rest, a fib    Results for WINFRED, IIAMS (MRN [id number]) as of 05/24/2018 11:21  Ref. Range 04/18/2018 15:46  FVC-Pre Latest Units: L 3.37  FVC-%Pred-Pre Latest Units: % 94   Results for MICHIAH, MUDRY (MRN [id number]) as of 05/24/2018 11:21  Ref. Range 04/18/2018 15:46  DLCO unc Latest Units: ml/min/mmHg 17.94  DLCO unc % pred Latest Units: % 60    ROS - per HPI     has a past medical history of Abnormal CT scan, stomach (02/2014), Allergic rhinitis due to pollen, Arthropathy, unspecified, site unspecified, Atherosclerosis, Atrial flutter ([place]) (02/24/14), Cardiomyopathy ([place]), Diverticulosis, ED (erectile dysfunction), Esophageal stricture (03/2014), Gastric AVM, GERD (gastroesophageal reflux disease) (03/2014), GI bleed, Habitual alcohol use, Hemorrhoids, Hiatal hernia, Hyperlipidemia, Hypertension, Obesity, OSA on CPAP, PAF (paroxysmal atrial fibrillation) ([place]), Prostatitis, unspecified, Sinus bradycardia, and Skin cancer.   reports that he quit smoking  about 9 years ago. His smoking use included cigarettes and cigars. He smoked 0.00 packs per day for 40.00 years. He has quit using smokeless tobacco.  His smokeless tobacco use included chew.  Past Surgical History:  Procedure Laterality Date  . ATRIAL FLUTTER ABLATION N/A 03/19/2014   RFCA of atrial flutter by Dr Caryl Comes  . CARDIOVERSION N/A 02/24/2014   Procedure: CARDIOVERSION;  Surgeon: Lelon Perla, MD;  Location: [facility];  Service: Cardiovascular;  Laterality: N/A;  . CARDIOVERSION Right 09/10/2014   Procedure: CARDIOVERSION;  Surgeon: Deboraha Sprang, MD;  Location: [facility] CATH LAB;  Service: Cardiovascular;  Laterality: Right;  . CARDIOVERSION N/A 01/22/2018   Procedure: CARDIOVERSION;  Surgeon: Fay Records, MD;  Location: [facility] ENDOSCOPY;  Service: Cardiovascular;  Laterality: N/A;  . COLONOSCOPY N/A 04/23/2015   Procedure: COLONOSCOPY;  Surgeon: Ladene Artist, MD;  Location: [facility] ENDOSCOPY;  Service: Endoscopy;  Laterality: N/A;  . ENTEROSCOPY N/A 06/12/2015    Procedure: ENTEROSCOPY;  Surgeon: Ladene Artist, MD;  Location: [facility] ENDOSCOPY;  Service: Endoscopy;  Laterality: N/A;  . EP IMPLANTABLE DEVICE N/A 02/15/2016   Procedure: Pacemaker Implant;  Surgeon: Evans Lance, MD;  Location: [facility] CV LAB;  Service: Cardiovascular;  Laterality: N/A;  . ESOPHAGOGASTRODUODENOSCOPY N/A 04/09/2014   Procedure: ESOPHAGOGASTRODUODENOSCOPY (EGD);  Surgeon: Inda Castle, MD;  Location: [facility];  Service: Endoscopy;  Laterality: N/A;  . ESOPHAGOGASTRODUODENOSCOPY N/A 04/23/2015   Procedure: ESOPHAGOGASTRODUODENOSCOPY (EGD);  Surgeon: Ladene Artist, MD;  Location: [facility] ENDOSCOPY;  Service: Endoscopy;  Laterality: N/A;  . INGUINAL HERNIA REPAIR     right  . INGUINAL HERNIA REPAIR     left  . LEFT HEART CATH AND CORONARY ANGIOGRAPHY N/A 03/16/2017   Procedure: LEFT HEART CATH AND CORONARY ANGIOGRAPHY;  Surgeon: Burnell Blanks, MD;  Location: [facility] CV LAB;  Service: Cardiovascular;  Laterality: N/A;  . ORIF fracture of the elbow    . SKIN CANCER EXCISION  05/21/12   Squamous cell ca  . TEE WITHOUT CARDIOVERSION N/A 02/24/2014   Procedure: TRANSESOPHAGEAL ECHOCARDIOGRAM (TEE);  Surgeon: Lelon Perla, MD;  Location: [facility] ENDOSCOPY;  Service: Cardiovascular;  Laterality: N/A;  . TONSILLECTOMY      No Known Allergies  Immunization History  Administered Date(s) Administered  . H1N1 04/28/2008  . Influenza Split 02/23/2011, 02/07/2012  . Influenza Whole 02/07/2008, 02/04/2009  . Influenza, High Dose Seasonal PF 01/20/2015, 03/07/2016, 02/20/2017, 02/20/2018  . Influenza,inj,Quad PF,6+ Mos 02/07/2013, 02/03/2014  . Pneumococcal Conjugate-13 05/07/2013  . Pneumococcal Polysaccharide-23 03/20/2007, 03/07/2016  . Td 04/11/2008  . Tdap 04/16/2018  . Zoster 01/27/2010  . Zoster Recombinat (Shingrix) 02/27/2018, 05/01/2018    Family History  Problem Relation Age of Onset  . Cancer Mother        ovarian  . Other Father        renal  disease- grief over loss of spouse  . Cancer Brother        prostate- died of hematologic disorder 2nd to chemo  . Pulmonary fibrosis Brother   . Diabetes Neg Hx   . Coronary artery disease Neg Hx   . Colon cancer Neg Hx   . Stomach cancer Neg Hx   . Rectal cancer Neg Hx      Current Outpatient Medications:  .  atorvastatin (LIPITOR) 40 MG tablet, TAKE 1 TABLET ONCE DAILY., Disp: 90 tablet, Rfl: 0 .  diltiazem (CARDIZEM CD) 120 MG 24 hr capsule, Take 1 capsule (120 mg total) by mouth  2 (two) times daily., Disp: 180 capsule, Rfl: 1 .  ELIQUIS 5 MG TABS tablet, TAKE 1 TABLET BY MOUTH TWICE DAILY., Disp: 180 tablet, Rfl: 3 .  ferrous sulfate 325 (65 FE) MG tablet, Take 1 tablet (325 mg total) by mouth daily with breakfast., Disp: 60 tablet, Rfl: 3 .  furosemide (LASIX) 20 MG tablet, Take 20 mg by mouth daily., Disp: , Rfl:  .  isosorbide mononitrate (IMDUR) 30 MG 24 hr tablet, TAKE 1 TABLET BY MOUTH DAILY., Disp: 90 tablet, Rfl: 3 .  Magnesium 65 MG TABS, Take 6 tablets by mouth at bedtime., Disp: , Rfl:  .  metoprolol tartrate (LOPRESSOR) 50 MG tablet, Take 25 mg by mouth 2 (two) times daily., Disp: , Rfl:  .  metroNIDAZOLE (METROGEL) 0.75 % gel, Apply 1 application topically daily as needed (for rosacea). , Disp: , Rfl:  .  Multiple Vitamins-Minerals (EYE VITAMINS) CAPS, Take 1 capsule by mouth 2 (two) times daily., Disp: , Rfl:  .  Multiple Vitamins-Minerals (MULTIVITAMIN PO), Take 1 tablet by mouth 2 (two) times daily., Disp: , Rfl:  .  nitroGLYCERIN (NITROSTAT) 0.4 MG SL tablet, PLACE 1 TABLET UNDER THE TONGUE EVERY 5 MINUTES AS NEEDED FOR CHEST PAIN, MAY REPEAT FOR 3 DOSES. (Patient taking differently: Place 0.4 mg under the tongue every 5 (five) minutes as needed for chest pain. ), Disp: 25 tablet, Rfl: 5 .  pantoprazole (PROTONIX) 40 MG tablet, Take 1 tablet (40 mg total) by mouth daily., Disp: 90 tablet, Rfl: 3      Objective:   Vitals:   05/24/18 0952  BP: 114/62  Pulse: (!)  130  SpO2: 97%  Weight: 222 lb 9.6 oz (101 kg)  Height: _0  (1.727 m)    Estimated body mass index is 33.85 kg/m as calculated from the following:   Height as of this encounter: _1  (1.727 m).   Weight as of this encounter: 222 lb 9.6 oz (101 kg).  _2 @  [employer]   05/24/18 0952  Weight: 222 lb 9.6 oz (101 kg)     Physical Exam  General Appearance:    Alert, cooperative, no distress, appears stated age - yes , Deconditioned looking - no , OBESE  - yes, Sitting on Wheelchair -  no  Head:    Normocephalic, without obvious abnormality, atraumatic  Eyes:    PERRL, conjunctiva/corneas clear,  Ears:    Normal TM's and external ear canals, both ears  Nose:   Nares normal, septum midline, mucosa normal, no drainage    or sinus tenderness. OXYGEN ON  - n . Patient is @ ra   Throat:   Lips, mucosa, and tongue normal; teeth and gums normal. Cyanosis on lips - no  Neck:   Supple, symmetrical, trachea midline, no adenopathy;    thyroid:  no enlargement/tenderness/nodules; no carotid   bruit or JVD  Back:     Symmetric, no curvature, ROM normal, no CVA tenderness  Lungs:     Distress - no , Wheeze no, Barrell Chest - no, Purse lip breathing - no, Crackles -fine crackles at lung base present  Chest Wall:    No tenderness or deformity.    Heart:    Regular rate and rhythm, S1 and S2 normal, no rub   or gallop, Murmur - no  Breast Exam:    NOT DONE  Abdomen:     Soft, non-tender, bowel sounds active all four quadrants,    no masses, no organomegaly. Visceral  obesity - yes  Genitalia:   NOT DONE  Rectal:   NOT DONE  Extremities:   Extremities - normal, Has Cane - no, Clubbing - no, Edema - no  Pulses:   2+ and symmetric all extremities  Skin:   Stigmata of Connective Tissue Disease - no  Lymph nodes:   Cervical, supraclavicular, and axillary nodes normal  Psychiatric:  Neurologic:   Pleasant - yes, Anxious - no, Flat affect - no  CAm-ICU - neg, Alert and Oriented x 3 -  yes, Moves all 4s - yes, Speech - normal, Cognition - intact           Assessment:       ICD-10-CM   1. ILD (interstitial lung disease) (HCC) J84.9 Sed Rate (ESR)    Antinuclear Antib (ANA)    Anti-DNA antibody, double-stranded    Rheumatoid Factor    Cyclic citrul peptide antibody, IgG    Sjogren's syndrome antibods(ssa + ssb)    Anti-scleroderma antibody    ANCA Screen Reflex Titer    Mpo/pr-3 (anca) antibodies    CK Total (and CKMB)    Aldolase    Hypersensitivity pnuemonitis profile  2. Family history of pulmonary fibrosis Z83.6        Plan:     Patient Instructions     ICD-10-CM   1. ILD (interstitial lung disease) ([place]) J84.9   2. Family history of pulmonary fibrosis Z83.6      - You  have Interstitial Lung Disease (ILD) -This appears new since 2014 CT chest in June 2017 CT abdomen lung cuts  -  There are > 100 varieties of this but based on age, family history, smoking history, work history, acid reflux history, male gender and caucasian suspect IPF. However, with down pillow use suspect chronic hypersensitivity pneumonitis  PLAN - No amiodarone for  A Fib - Throw away all down pillow and duvet use completely out of the house - To further narrow down possibilities and assess severity please do the following tests   - do autoimmune panel: Serum: ESR, ANA, DS-DNA, RF, anti-CCP, ssA, ssB, scl-70, ANCA, MPO and PR-3 antibodies, Total CK,  Aldolase,  Hypersensitivity Pneumonitis Panel  Followup - return in 6 weeks to ILD clinic to discuss results - Based on results and starting of tikosyn we can decide next step  - if aftrer blood work ILD is undifferentiated we need to discuss biopsy that can range from just lavage with bronchoscopy to transbronchial biopsy ([facility] test) to surgical lung biopsy. At this point, need to have discussion with Dr Caryl Comes about cardiac risk and stopping eliquis for procedure      SIGNATURE    Dr. Brand Males, M.D., F.C.C.P,   Pulmonary and Critical Care Medicine Staff Physician, [place] Director - Interstitial Lung Disease  Program  Pulmonary [place] at [place], [place], [address]  Pager: [phone], If no answer or between  15:00h - 7:00h: call [phone] Telephone: [phone]  11:16 AM 05/24/2018

## 2018-05-25 DIAGNOSIS — C44519 Basal cell carcinoma of skin of other part of trunk: Secondary | ICD-10-CM | POA: Diagnosis not present

## 2018-05-25 DIAGNOSIS — L821 Other seborrheic keratosis: Secondary | ICD-10-CM | POA: Diagnosis not present

## 2018-05-25 DIAGNOSIS — Z85828 Personal history of other malignant neoplasm of skin: Secondary | ICD-10-CM | POA: Diagnosis not present

## 2018-05-25 DIAGNOSIS — D225 Melanocytic nevi of trunk: Secondary | ICD-10-CM | POA: Diagnosis not present

## 2018-05-25 DIAGNOSIS — L570 Actinic keratosis: Secondary | ICD-10-CM | POA: Diagnosis not present

## 2018-05-28 ENCOUNTER — Encounter (HOSPITAL_COMMUNITY): Payer: Self-pay | Admitting: Nurse Practitioner

## 2018-05-28 ENCOUNTER — Other Ambulatory Visit: Payer: Self-pay

## 2018-05-28 ENCOUNTER — Ambulatory Visit (HOSPITAL_COMMUNITY)
Admission: RE | Admit: 2018-05-28 | Discharge: 2018-05-28 | Disposition: A | Discharge status: Home / Self Care | Payer: PPO | Source: Ambulatory Visit | Attending: Nurse Practitioner | Admitting: Nurse Practitioner

## 2018-05-28 ENCOUNTER — Inpatient Hospital Stay (HOSPITAL_COMMUNITY)
Admission: RE | Admit: 2018-05-28 | Discharge: 2018-05-31 | DRG: 309 | Disposition: A | Discharge status: Home / Self Care | Payer: PPO | Source: Ambulatory Visit | Attending: Internal Medicine | Admitting: Internal Medicine

## 2018-05-28 VITALS — BP 108/74 | HR 130 | Ht 68.0 in | Wt 223.0 lb

## 2018-05-28 DIAGNOSIS — I495 Sick sinus syndrome: Secondary | ICD-10-CM | POA: Diagnosis present

## 2018-05-28 DIAGNOSIS — I10 Essential (primary) hypertension: Secondary | ICD-10-CM | POA: Diagnosis not present

## 2018-05-28 DIAGNOSIS — E785 Hyperlipidemia, unspecified: Secondary | ICD-10-CM | POA: Diagnosis not present

## 2018-05-28 DIAGNOSIS — Z87891 Personal history of nicotine dependence: Secondary | ICD-10-CM | POA: Diagnosis not present

## 2018-05-28 DIAGNOSIS — J849 Interstitial pulmonary disease, unspecified: Secondary | ICD-10-CM | POA: Diagnosis present

## 2018-05-28 DIAGNOSIS — I370 Nonrheumatic pulmonary valve stenosis: Secondary | ICD-10-CM | POA: Diagnosis not present

## 2018-05-28 DIAGNOSIS — Z8041 Family history of malignant neoplasm of ovary: Secondary | ICD-10-CM | POA: Diagnosis not present

## 2018-05-28 DIAGNOSIS — I484 Atypical atrial flutter: Secondary | ICD-10-CM | POA: Diagnosis not present

## 2018-05-28 DIAGNOSIS — K5730 Diverticulosis of large intestine without perforation or abscess without bleeding: Secondary | ICD-10-CM | POA: Diagnosis not present

## 2018-05-28 DIAGNOSIS — K219 Gastro-esophageal reflux disease without esophagitis: Secondary | ICD-10-CM | POA: Diagnosis not present

## 2018-05-28 DIAGNOSIS — Z8042 Family history of malignant neoplasm of prostate: Secondary | ICD-10-CM | POA: Diagnosis not present

## 2018-05-28 DIAGNOSIS — R079 Chest pain, unspecified: Secondary | ICD-10-CM | POA: Diagnosis present

## 2018-05-28 DIAGNOSIS — I4892 Unspecified atrial flutter: Secondary | ICD-10-CM | POA: Diagnosis present

## 2018-05-28 DIAGNOSIS — Z7901 Long term (current) use of anticoagulants: Secondary | ICD-10-CM

## 2018-05-28 DIAGNOSIS — G4733 Obstructive sleep apnea (adult) (pediatric): Secondary | ICD-10-CM | POA: Diagnosis present

## 2018-05-28 DIAGNOSIS — I480 Paroxysmal atrial fibrillation: Secondary | ICD-10-CM | POA: Diagnosis not present

## 2018-05-28 DIAGNOSIS — I4819 Other persistent atrial fibrillation: Principal | ICD-10-CM | POA: Diagnosis present

## 2018-05-28 DIAGNOSIS — I429 Cardiomyopathy, unspecified: Secondary | ICD-10-CM | POA: Diagnosis present

## 2018-05-28 DIAGNOSIS — Z841 Family history of disorders of kidney and ureter: Secondary | ICD-10-CM | POA: Diagnosis not present

## 2018-05-28 DIAGNOSIS — M129 Arthropathy, unspecified: Secondary | ICD-10-CM | POA: Diagnosis present

## 2018-05-28 DIAGNOSIS — Z95810 Presence of automatic (implantable) cardiac defibrillator: Secondary | ICD-10-CM

## 2018-05-28 DIAGNOSIS — Z85828 Personal history of other malignant neoplasm of skin: Secondary | ICD-10-CM

## 2018-05-28 DIAGNOSIS — K449 Diaphragmatic hernia without obstruction or gangrene: Secondary | ICD-10-CM | POA: Diagnosis not present

## 2018-05-28 DIAGNOSIS — I351 Nonrheumatic aortic (valve) insufficiency: Secondary | ICD-10-CM | POA: Diagnosis not present

## 2018-05-28 LAB — BASIC METABOLIC PANEL
ANION GAP: 8 (ref 5–15)
BUN: 23 mg/dL (ref 8–23)
CALCIUM: 9.1 mg/dL (ref 8.9–10.3)
CO2: 23 mmol/L (ref 22–32)
CREATININE: 0.93 mg/dL (ref 0.61–1.24)
Chloride: 107 mmol/L (ref 98–111)
Glucose, Bld: 106 mg/dL — ABNORMAL HIGH (ref 70–99)
Potassium: 4.6 mmol/L (ref 3.5–5.1)
Sodium: 138 mmol/L (ref 135–145)

## 2018-05-28 LAB — CBC
HCT: 40.4 % (ref 39.0–52.0)
Hemoglobin: 13.6 g/dL (ref 13.0–17.0)
MCH: 32.8 pg (ref 26.0–34.0)
MCHC: 33.7 g/dL (ref 30.0–36.0)
MCV: 97.3 fL (ref 80.0–100.0)
Platelets: 187 10*3/uL (ref 150–400)
RBC: 4.15 MIL/uL — ABNORMAL LOW (ref 4.22–5.81)
RDW: 12.7 % (ref 11.5–15.5)
WBC: 8.2 10*3/uL (ref 4.0–10.5)
nRBC: 0 % (ref 0.0–0.2)

## 2018-05-28 LAB — MAGNESIUM: Magnesium: 2 mg/dL (ref 1.7–2.4)

## 2018-05-28 MED ORDER — PROSIGHT PO TABS
1.0000 | ORAL_TABLET | Freq: Two times a day (BID) | ORAL | Status: DC
  Administered 2018-05-28 – 2018-05-31 (×6): 1 via ORAL
  Filled 2018-05-28 (×6): qty 1

## 2018-05-28 MED ORDER — PANTOPRAZOLE SODIUM 40 MG PO TBEC
40.0000 mg | DELAYED_RELEASE_TABLET | Freq: Every day | ORAL | Status: DC
  Administered 2018-05-29 – 2018-05-31 (×3): 40 mg via ORAL
  Filled 2018-05-28 (×3): qty 1

## 2018-05-28 MED ORDER — SODIUM CHLORIDE 0.9% FLUSH
3.0000 mL | Freq: Two times a day (BID) | INTRAVENOUS | Status: DC
  Administered 2018-05-28 – 2018-05-29 (×2): 3 mL via INTRAVENOUS

## 2018-05-28 MED ORDER — FUROSEMIDE 10 MG/ML IJ SOLN
INTRAMUSCULAR | Status: AC
  Filled 2018-05-28: qty 4

## 2018-05-28 MED ORDER — FUROSEMIDE 10 MG/ML IJ SOLN
40.0000 mg | Freq: Two times a day (BID) | INTRAMUSCULAR | Status: AC
  Administered 2018-05-28 – 2018-05-29 (×2): 40 mg via INTRAVENOUS
  Filled 2018-05-28: qty 4

## 2018-05-28 MED ORDER — MAGNESIUM OXIDE 400 (241.3 MG) MG PO TABS
400.0000 mg | ORAL_TABLET | Freq: Every day | ORAL | Status: DC
  Administered 2018-05-28 – 2018-05-30 (×3): 400 mg via ORAL
  Filled 2018-05-28 (×3): qty 1

## 2018-05-28 MED ORDER — APIXABAN 5 MG PO TABS: 5.0000 mg | ORAL_TABLET | Freq: Two times a day (BID) | ORAL | Status: DC

## 2018-05-28 MED ORDER — ISOSORBIDE MONONITRATE ER 30 MG PO TB24
30.0000 mg | ORAL_TABLET | Freq: Every day | ORAL | Status: DC
  Administered 2018-05-29 – 2018-05-31 (×3): 30 mg via ORAL
  Filled 2018-05-28 (×3): qty 1

## 2018-05-28 MED ORDER — DILTIAZEM HCL ER COATED BEADS 120 MG PO CP24
120.0000 mg | ORAL_CAPSULE | Freq: Two times a day (BID) | ORAL | Status: DC
  Administered 2018-05-28 – 2018-05-31 (×6): 120 mg via ORAL
  Filled 2018-05-28 (×6): qty 1

## 2018-05-28 MED ORDER — APIXABAN 5 MG PO TABS
5.0000 mg | ORAL_TABLET | Freq: Two times a day (BID) | ORAL | Status: DC
  Administered 2018-05-28 – 2018-05-31 (×6): 5 mg via ORAL
  Filled 2018-05-28 (×6): qty 1

## 2018-05-28 MED ORDER — DOFETILIDE 500 MCG PO CAPS
500.0000 ug | ORAL_CAPSULE | Freq: Two times a day (BID) | ORAL | Status: DC
  Administered 2018-05-28 – 2018-05-31 (×6): 500 ug via ORAL
  Filled 2018-05-28 (×6): qty 1

## 2018-05-28 MED ORDER — SODIUM CHLORIDE 0.9 % IV SOLN: 250.0000 mL | INTRAVENOUS | Status: DC | PRN

## 2018-05-28 MED ORDER — MAGNESIUM SULFATE 2 GM/50ML IV SOLN
2.0000 g | Freq: Once | INTRAVENOUS | Status: AC
  Administered 2018-05-28: 2 g via INTRAVENOUS
  Filled 2018-05-28: qty 50

## 2018-05-28 MED ORDER — METOPROLOL TARTRATE 25 MG PO TABS
25.0000 mg | ORAL_TABLET | Freq: Two times a day (BID) | ORAL | Status: DC
  Administered 2018-05-28 – 2018-05-31 (×6): 25 mg via ORAL
  Filled 2018-05-28 (×6): qty 1

## 2018-05-28 MED ORDER — SODIUM CHLORIDE 0.9% FLUSH: 3.0000 mL | INTRAVENOUS | Status: DC | PRN

## 2018-05-28 MED ORDER — ATORVASTATIN CALCIUM 40 MG PO TABS
40.0000 mg | ORAL_TABLET | Freq: Every day | ORAL | Status: DC
  Administered 2018-05-28 – 2018-05-30 (×3): 40 mg via ORAL
  Filled 2018-05-28 (×3): qty 1

## 2018-05-28 MED ORDER — FUROSEMIDE 20 MG PO TABS: 20.0000 mg | ORAL_TABLET | Freq: Every day | ORAL | Status: DC

## 2018-05-28 NOTE — Progress Notes (Signed)
.   Pharmacy Consult for [place] Electrolyte Replacement  Pharmacy consulted to assist in monitoring and replacing electrolytes in this 85 y.o. male admitted on 05/28/2018 undergoing dofetilide initiation. First dofetilide dose: 05/28/18  Labs:    Component Value Date/Time   K 4.6 05/28/2018 1021   MG 2.0 05/28/2018 1021     Plan: Potassium: K >/= 4: Appropriate to initiate Tikosyn    Magnesium: Mg 1.8-2: Mg 2 gm IV x1 to prevent Mg from dropping below 1.8 - do not need to recheck Mg. Appropriate to initiate Tikosyn   Thank you for allowing pharmacy to participate in this patient's care   Hildred Laser, PharmD Clinical Pharmacist **Pharmacist phone directory can now be found on [email or web address] ([email or web address]).  Listed under [place].

## 2018-05-28 NOTE — H&P (Addendum)
ZHx and PE as previously outlined  Apart from noting that 1-exercise intolerance and fatigue are new  Physical exam notable for JVP 9-10 cm, peripheral edema 1-2+  See note dated earlier today DC NP  Atrial fibrillation/flutter-paroxysmal  CHADS-VASc score 5 (age-7 hypertension-1 cardiomyopathy-1 vascular disease-1)  Aortic Valve Bicuspid previously described but no longer evident  Cardiomyopathy-resolved  Congestive heart failure acute/chronic question systolic versus diastolic  Pacemaker-[employer]  Hypertension  Exertional chest discomfort  Cath non obstructive disease   Admitted for dofetilide,  QT and K/Mg within range Will begin 500 bid  Will get echo now as prior hx of cardiomyopathy and wonder about the consequences of rate and the implications of therapy for recurrent LV dysfunction  In light of his heart failure, will also use intravenous Lasix while he is here.  As indicated in other note may also need one later to assess AoV

## 2018-05-28 NOTE — Care Management (Signed)
#    3.   S/W   MIKE  @ [employer]    RX #  [id number] OPT- 2   1.  TIKOSYN   500 MCG BID [place]   #  [phone]   2. DOFETILIDE  500 MCG BID COVER- YES CO-PAY- $ 90.00 TIER- 4 DRUG  PRIOR APPROVAL- NO  NO DEDUCTIBLE  PREFERRED PHARMACY- YES [facility]

## 2018-05-28 NOTE — Progress Notes (Signed)
Primary Care Physician: Janith Lima, MD Referring Physician: Dr. Jerrell Belfast Donald [name] is a 85 y.o. male with a h/o atrial flutter,afib, PPM, for tachy/brady syndrome, s/p ablation, OSA on cpap, alcohol use, cardiomyopathy, that was set up for cardioversion by Dr. Caryl Comes, but reported to the endo group that he had missed a dose of Eliquis, so DCCV was cancelled. He is now back in the afib clinic to get set up for cardioversion. He will be eligible for cardioversion the first of the week, not have missing any anticoagulation x 3 weeks. He remains in atrial flutter with RVR.  F/u from unsuccessful cardioversion, 9/9.He reports that his heart rate became regular and in the 60's 2 hours after cardioversion and he has been staying in SR since then. I discussed with Dr. Caryl Comes and instead of starting antiarrythmic, will see if pt continues in SR for now since he has not had frequent breakthrough episodes in the past. He feels improved.  F/u in afib clinic, 11/21. He went back into afib  last night around 5 pm. He continues in afib with RVR at 114 bpm, but in the office on pulse ox he is running around 80-90's at rest. He has reduced his nightly alcohol from 4 drinks a night but is still  drinking 2 drinks nightly. I believe he will  need an antiarrythmic and will confer with Dr. Caryl Comes which he drug he  would like to use. He  is using cpap.  F/u in afib clinic, 11/27. After discussion with Dr. Caryl Comes he feels that pt will be an amiodarone or Tikosyn candidate. He has  mod CAD and structural heart disease so will not use flecainide. After details given on both drugs, he feels that he would prefer amiodarone as he does not want to spend 4 days in the hospital. He does not have any significant lung disease, already is a pt of Tammy Parrett and Dr. Elsworth Soho mostly for treatment of sleep apnea, and I will ask if they need to do any additional testing/concerns with plans for amiodarone use. He does not have thyroid  or lung issues.  F/u in afib clinic, his lung CT showed possible early findings of ILD. Tammy Parrett, did not favor use of amiodarone, if so, would have to have lung testing every 6 months. He is now ready to listen to her advice  and would like to come  in for Tikosyn after the first of the year. He feels he can afford drug.   F/u in afib clinic,05/28/2018, for admission for dofetilide. He is not on any qtc prolonging, no benadryl. No missed eliquis doses. Aware of price of drug.   Today, he denies symptoms of palpitations, chest pain, shortness of breath, orthopnea, PND, lower extremity edema, dizziness, presyncope, syncope, or neurologic sequela. The patient is tolerating medications without difficulties and is otherwise without complaint today.   Past Medical History:  Diagnosis Date  . Abnormal CT scan, stomach 02/2014   Thickening of gastric fundus and cardia.  gastritis on EGD 03/2014  . Allergic rhinitis due to pollen   . Arthropathy, unspecified, site unspecified   . Atherosclerosis    a. Noted by abdominal CT 02/2014 (h/o normal nuc 2011).  . Atrial flutter ([place]) 02/24/14   s/p ablation 11/15  . Cardiomyopathy ([place])    tachy mediated - a. TEE (10/15):  EF 30%;  b. Echo after NSR restored (10/15):  mild LVH, EF 55-60%, mild AS, mild AI, mild MR,  mild to mod LAE, mild RAE  . Diverticulosis    severe in descending, sigmoid colon.   . ED (erectile dysfunction)   . Esophageal stricture 03/2014   traversable with endoscope. not dilated.   . Gastric AVM   . GERD (gastroesophageal reflux disease) 03/2014   small HH and gastritis on EGD  . GI bleed   . Habitual alcohol use   . Hemorrhoids   . Hiatal hernia   . Hyperlipidemia   . Hypertension   . Obesity   . OSA on CPAP   . PAF (paroxysmal atrial fibrillation) ( [place])   . Prostatitis, unspecified   . 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 Skin cancer    basal and squamous cell   Past Surgical History:  Procedure  Laterality Date  . ATRIAL FLUTTER ABLATION N/A 03/19/2014   RFCA of atrial flutter by Dr Caryl Comes  . CARDIOVERSION N/A 02/24/2014   Procedure: CARDIOVERSION;  Surgeon: Lelon Perla, MD;  Location: [facility];  Service: Cardiovascular;  Laterality: N/A;  . CARDIOVERSION Right 09/10/2014   Procedure: CARDIOVERSION;  Surgeon: Deboraha Sprang, MD;  Location: [facility] CATH LAB;  Service: Cardiovascular;  Laterality: Right;  . CARDIOVERSION N/A 01/22/2018   Procedure: CARDIOVERSION;  Surgeon: Fay Records, MD;  Location: [facility] ENDOSCOPY;  Service: Cardiovascular;  Laterality: N/A;  . COLONOSCOPY N/A 04/23/2015   Procedure: COLONOSCOPY;  Surgeon: Ladene Artist, MD;  Location: [facility] ENDOSCOPY;  Service: Endoscopy;  Laterality: N/A;  . ENTEROSCOPY N/A 06/12/2015   Procedure: ENTEROSCOPY;  Surgeon: Ladene Artist, MD;  Location: [facility] ENDOSCOPY;  Service: Endoscopy;  Laterality: N/A;  . EP IMPLANTABLE DEVICE N/A 02/15/2016   Procedure: Pacemaker Implant;  Surgeon: Evans Lance, MD;  Location: [place] CV LAB;  Service: Cardiovascular;  Laterality: N/A;  . ESOPHAGOGASTRODUODENOSCOPY N/A 04/09/2014   Procedure: ESOPHAGOGASTRODUODENOSCOPY (EGD);  Surgeon: Inda Castle, MD;  Location: [place];  Service: Endoscopy;  Laterality: N/A;  . ESOPHAGOGASTRODUODENOSCOPY N/A 04/23/2015   Procedure: ESOPHAGOGASTRODUODENOSCOPY (EGD);  Surgeon: Ladene Artist, MD;  Location: [facility] ENDOSCOPY;  Service: Endoscopy;  Laterality: N/A;  . INGUINAL HERNIA REPAIR     right  . INGUINAL HERNIA REPAIR     left  . LEFT HEART CATH AND CORONARY ANGIOGRAPHY N/A 03/16/2017   Procedure: LEFT HEART CATH AND CORONARY ANGIOGRAPHY;  Surgeon: Burnell Blanks, MD;  Location: [place] CV LAB;  Service: Cardiovascular;  Laterality: N/A;  . ORIF fracture of the elbow    . SKIN CANCER EXCISION  05/21/12   Squamous cell ca  . TEE WITHOUT CARDIOVERSION N/A 02/24/2014   Procedure: TRANSESOPHAGEAL ECHOCARDIOGRAM (TEE);  Surgeon: Lelon Perla, MD;  Location: [facility] ENDOSCOPY;  Service: Cardiovascular;  Laterality: N/A;  . TONSILLECTOMY      Current Outpatient Medications  Medication Sig Dispense Refill  . atorvastatin (LIPITOR) 40 MG tablet TAKE 1 TABLET ONCE DAILY. 90 tablet 0  . diltiazem (CARDIZEM CD) 120 MG 24 hr capsule Take 1 capsule (120 mg total) by mouth 2 (two) times daily. 180 capsule 1  . ELIQUIS 5 MG TABS tablet TAKE 1 TABLET BY MOUTH TWICE DAILY. 180 tablet 3  . ferrous sulfate 325 (65 FE) MG tablet Take 1 tablet (325 mg total) by mouth daily with breakfast. 60 tablet 3  . furosemide (LASIX) 20 MG tablet Take 20 mg by mouth daily.    . isosorbide mononitrate (IMDUR) 30 MG 24 hr tablet TAKE 1 TABLET BY MOUTH  DAILY. 90 tablet 3  . Magnesium 65 MG TABS Take 6 tablets by mouth at bedtime.    . metroNIDAZOLE (METROGEL) 0.75 % gel Apply 1 application topically daily as needed (for rosacea).     . Multiple Vitamins-Minerals (EYE VITAMINS) CAPS Take 1 capsule by mouth 2 (two) times daily.    . Multiple Vitamins-Minerals (MULTIVITAMIN PO) Take 1 tablet by mouth 2 (two) times daily.    . pantoprazole (PROTONIX) 40 MG tablet Take 1 tablet (40 mg total) by mouth daily. 90 tablet 3  . metoprolol tartrate (LOPRESSOR) 50 MG tablet Take 25 mg by mouth 2 (two) times daily.    . nitroGLYCERIN (NITROSTAT) 0.4 MG SL tablet PLACE 1 TABLET UNDER THE TONGUE EVERY 5 MINUTES AS NEEDED FOR CHEST PAIN, MAY REPEAT FOR 3 DOSES. (Patient not taking: No sig reported) 25 tablet 5   No current facility-administered medications for this encounter.     No Known Allergies  Social History   Socioeconomic History  . Marital status: Married    Spouse name: Not on file  . Number of children: 3  . Years of education: 24  . Highest education level: Not on file  Occupational History  . Occupation: Therapist, sports: RETIRED  Social Needs  . Financial resource strain: Not hard at all  . Food insecurity:    Worry: Never true     Inability: Never true  . Transportation needs:    Medical: No    Non-medical: No  Tobacco Use  . Smoking status: Former Smoker    Packs/day: 0.00    Years: 40.00    Pack years: 0.00    Types: Cigarettes, Cigars    Last attempt to quit: 09/18/2008    Years since quitting: 9.6  . Smokeless tobacco: Former Systems developer    Types: Chew  . Tobacco comment: Has Occasional Cigar/ quit   Substance and Sexual Activity  . Alcohol use: Yes    Alcohol/week: 28.0 standard drinks    Types: 21 Glasses of wine, 7 Shots of liquor per week    Comment: states he does want to drink less and cut back on the amount.   . Drug use: No  . Sexual activity: Not Currently  Lifestyle  . Physical activity:    Days per week: 4 days    Minutes per session: 50 min  . Stress: Not at all  Relationships  . Social connections:    Talks on phone: More than three times a week    Gets together: More than three times a week    Attends religious service: More than 4 times per year    Active member of club or organization: Yes    Attends meetings of clubs or organizations: More than 4 times per year    Relationship status: Married  . Intimate partner violence:    Fear of current or ex partner: No    Emotionally abused: No    Physically abused: No    Forced sexual activity: No  Other Topics Concern  . 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 married 1961. 2 sons- '64, '63 , I daughter- '66, 4 grandchildren. work: Museum/gallery curator, retired but still consults. 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      No new stressors/     Family History  Problem Relation Age of Onset  .  Cancer Mother        ovarian  . Other Father        renal disease- grief over loss of spouse  . Cancer Brother        prostate- died of hematologic disorder 2nd to chemo  . Pulmonary fibrosis Brother   . Diabetes Neg Hx   .  Coronary artery disease Neg Hx   . Colon cancer Neg Hx   . Stomach cancer Neg Hx   . Rectal cancer Neg Hx     ROS- All systems are reviewed and negative except as per the HPI above  Physical Exam: Vitals:   05/28/18 1021  BP: 108/74  Pulse: (!) 130  Weight: 101.2 kg  Height: 5\' 8"  (1.727 m)   Wt Readings from Last 3 Encounters:  05/28/18 101.2 kg  05/24/18 101 kg  05/01/18 99.8 kg    Labs: Lab Results  Component Value Date   NA 138 04/11/2018   K 4.1 04/11/2018   CL 107 04/11/2018   CO2 25 04/11/2018   GLUCOSE 110 (H) 04/11/2018   BUN 20 04/11/2018   CREATININE 1.14 04/11/2018   CALCIUM 9.2 04/11/2018   PHOS 2.7 02/14/2016   MG 2.0 02/14/2016   Lab Results  Component Value Date   INR 1.52 06/08/2017   Lab Results  Component Value Date   CHOL 107 06/08/2017   HDL 40.80 06/08/2017   LDLCALC 49 06/08/2017   TRIG 85.0 06/08/2017     GEN- The patient is well appearing, alert and oriented x 3 today.   Head- normocephalic, atraumatic Eyes-  Sclera clear, conjunctiva pink Ears- hearing intact Oropharynx- clear Neck- supple, no JVP Lymph- no cervical lymphadenopathy Lungs- Clear to ausculation bilaterally, normal work of breathing Heart- irregular rate and rhythm, no murmurs, rubs or gallops, PMI not laterally displaced GI- soft, NT, ND, + BS Extremities- no clubbing, cyanosis, or edema MS- no significant deformity or atrophy Skin- no rash or lesion Psych- euthymic mood, full affect Neuro- strength and sensation are intact  EKG- atrial flutter at 130 bpm, PR int 130 bpm, qrs nt 92 ms, qtc 432 ms  ms  Chest CT-IMPRESSION: 1. There are subtle changes in the lungs, which could be indicative of interstitial lung disease. At this point, findings are classified as indeterminate for usual interstitial pneumonia (UIP) per current ATS guidelines. 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 If there's persistent  clinical concern for interstitial lung disease, repeat high-resolution chest CT is recommended in 12 months to assess for temporal changes in the appearance of the lung parenchyma. 2. Mild diffuse bronchial wall thickening with mild centrilobular and paraseptal emphysema; imaging findings suggestive of underlying COPD. 3. Aortic atherosclerosis, in addition to left main and 3 vessel coronary artery disease(known CAD with prior LHC). 4. Left atrial enlargement. 5. There are calcifications of the aortic valve. Echocardiographic correlation for evaluation of potential valvular dysfunction may be warranted if clinically indicated.Will need Echo when pt is back in SR to further assess AV.   Assessment and Plan: 1. Atrial fib/ flutter Pt had unsuccessful cardioversion but went back in rhythm shortly after cardioversion in September He is now back in afib/flutter Discussed in detail risk vrs benefit of Tikosyn vrs amiodarone  Pt wanted  to avoid hospitalization and go with amiodarone, however, chest CT, showed possible early ILD so pt is now willing to pursue tikosyn ( brother died from pulmonary fibrosis) Continue eliquis 5 mg bid for CHA2DS2VASc score  of 5, no missed doses x at least 3 weeks Will continue cardizem 120 mg bid and  metoprolol  25 mg bid for now coverage He was advised to decrease alcohol use to help discourage return of afib/flutter to no more than 2 a week  Continue to use Cpap to treat  OSA He will need f/u echo when in SR to further evaluate severe calcification of the aortic valve Cbc, bmet, mag today   2. PPM Per Dr. Caryl Comes and device clinc  3. HTN  Stable    Talani Brazee C. Adalee Kathan, [facility] [address] [place], [place] [address] [phone]

## 2018-05-28 NOTE — Progress Notes (Signed)
Pharmacy Review for Dofetilide (Tikosyn) Initiation  Admit Complaint: 85 y.o. male admitted 05/28/2018 with atrial fibrillation to be initiated on dofetilide.   Assessment:  Patient Exclusion Criteria: If any screening criteria checked as "Yes", then  patient  should NOT receive dofetilide until criteria item is corrected. If "Yes" please indicate correction plan.  YES  NO Patient  Exclusion Criteria Correction Plan  []  [x]  Baseline QTc interval is greater than or equal to 440 msec. IF above YES box checked dofetilide contraindicated unless patient has ICD; then may proceed if QTc 500-550 msec or with known ventricular conduction abnormalities may proceed with QTc 550-600 msec. QTc =  432   []  [x]  Magnesium level is less than 1.8 mEq/l : Last magnesium:  Lab Results  Component Value Date   MG 2.0 05/28/2018         []  [x]  Potassium level is less than 4 mEq/l : Last potassium:  Lab Results  Component Value Date   K 4.6 05/28/2018         []  [x]  Patient is known or suspected to have a digoxin level greater than 2 ng/ml: No results found for: DIGOXIN    []  [x]  Creatinine clearance less than 20 ml/min (calculated using Cockcroft-Gault, actual body weight and serum creatinine): Estimated Creatinine Clearance: 69.4 mL/min (by C-G formula based on SCr of 0.93 mg/dL).    []  [x]  Patient has received drugs known to prolong the QT intervals within the last 48 hours (phenothiazines, tricyclics or tetracyclic antidepressants, erythromycin, H-1 antihistamines, cisapride, fluoroquinolones, azithromycin). Drugs not listed above may have an, as yet, undetected potential to prolong the QT interval, updated information on QT prolonging agents is available at this website:QT prolonging agents   []  [x]  Patient received a dose of hydrochlorothiazide (Oretic) alone or in any combination including triamterene (Dyazide, Maxzide) in the last 48 hours.   []  [x]  Patient received a medication known to increase  dofetilide plasma concentrations prior to initial dofetilide dose:  . Trimethoprim (Primsol, Proloprim) in the last 36 hours . Verapamil (Calan, Verelan) in the last 36 hours or a sustained release dose in the last 72 hours . Megestrol (Megace) in the last 5 days  . Cimetidine (Tagamet) in the last 6 hours . Ketoconazole (Nizoral) in the last 24 hours . Itraconazole (Sporanox) in the last 48 hours  . Prochlorperazine (Compazine) in the last 36 hours    []  [x]  Patient is known to have a history of torsades de pointes; congenital or acquired long QT syndromes.   []  [x]  Patient has received a Class 1 antiarrhythmic with less than 2 half-lives since last dose. (Disopyramide, Quinidine, Procainamide, Lidocaine, Mexiletine, Flecainide, Propafenone)   []  [x]  Patient has received amiodarone therapy in the past 3 months or amiodarone level is greater than 0.3 ng/ml.    Patient has been appropriately anticoagulated with apixaban.  Ordering provider was confirmed at [email or web address] if they are not listed on the [facility] Prescribers list.  Goal of Therapy: Follow renal function, electrolytes, potential drug interactions, and dose adjustment. Provide education and 1 week supply at discharge.  Plan:  [x]   Physician selected initial dose within range recommended for patients level of renal function - will monitor for response.  []   Physician selected initial dose outside of range recommended for patients level of renal function - will discuss if the dose should be altered at this time.   Select One Calculated CrCl  Dose q12h  [x]  > 60 ml/min 500 mcg  []   40-60 ml/min 250 mcg  []  20-40 ml/min 125 mcg   2. Follow up QTc after the first 5 doses, renal function, electrolytes (K & Mg) daily x 3     days, dose adjustment, success of initiation and facilitate 1 week discharge supply as     clinically indicated.  3. Initiate Tikosyn education video (Call [phone] and ask for Tikosyn Video #  [id number]).  Hildred Laser, PharmD Clinical Pharmacist **Pharmacist phone directory can now be found on [email or web address] ([email or web address]).  Listed under [place].

## 2018-05-29 ENCOUNTER — Inpatient Hospital Stay (HOSPITAL_COMMUNITY): Payer: PPO

## 2018-05-29 DIAGNOSIS — I370 Nonrheumatic pulmonary valve stenosis: Secondary | ICD-10-CM

## 2018-05-29 DIAGNOSIS — I351 Nonrheumatic aortic (valve) insufficiency: Secondary | ICD-10-CM

## 2018-05-29 LAB — BASIC METABOLIC PANEL
Anion gap: 8 (ref 5–15)
BUN: 19 mg/dL (ref 8–23)
CO2: 26 mmol/L (ref 22–32)
Calcium: 9.1 mg/dL (ref 8.9–10.3)
Chloride: 105 mmol/L (ref 98–111)
Creatinine, Ser: 1.11 mg/dL (ref 0.61–1.24)
GFR calc Af Amer: >60 mL/min (ref >60)
GFR calc non Af Amer: >60 mL/min (ref >60)
Glucose, Bld: 97 mg/dL (ref 70–99)
Potassium: 3.9 mmol/L (ref 3.5–5.1)
Sodium: 139 mmol/L (ref 135–145)

## 2018-05-29 LAB — MAGNESIUM: Magnesium: 2 mg/dL (ref 1.7–2.4)

## 2018-05-29 LAB — ECHOCARDIOGRAM COMPLETE
Height: 68 in
WEIGHTICAEL: 3414.4 [oz_av]

## 2018-05-29 MED ORDER — HYDROCORTISONE 1 % EX CREA
1.0000 "application " | TOPICAL_CREAM | Freq: Three times a day (TID) | CUTANEOUS | Status: DC | PRN
  Filled 2018-05-29: qty 28

## 2018-05-29 MED ORDER — SODIUM CHLORIDE 0.9 % IV SOLN: 250.0000 mL | INTRAVENOUS | Status: DC

## 2018-05-29 MED ORDER — POTASSIUM CHLORIDE CRYS ER 20 MEQ PO TBCR
40.0000 meq | EXTENDED_RELEASE_TABLET | Freq: Once | ORAL | Status: AC
  Administered 2018-05-29: 40 meq via ORAL
  Filled 2018-05-29: qty 2

## 2018-05-29 MED ORDER — MAGNESIUM SULFATE 2 GM/50ML IV SOLN
2.0000 g | Freq: Once | INTRAVENOUS | Status: AC
  Administered 2018-05-29: 2 g via INTRAVENOUS
  Filled 2018-05-29: qty 50

## 2018-05-29 MED ORDER — SODIUM CHLORIDE 0.9% FLUSH
3.0000 mL | Freq: Two times a day (BID) | INTRAVENOUS | Status: DC
  Administered 2018-05-29 – 2018-05-31 (×3): 3 mL via INTRAVENOUS

## 2018-05-29 MED ORDER — SODIUM CHLORIDE 0.9% FLUSH: 3.0000 mL | INTRAVENOUS | Status: DC | PRN

## 2018-05-29 NOTE — Progress Notes (Signed)
Pharmacy Consult for Donald [name] Electrolyte Replacement  Pharmacy consulted to assist in monitoring and replacing electrolytes in this 85 y.o. male admitted on 05/28/2018 undergoing dofetilide initiation. First dofetilide dose 8pm on 1/13  Labs:    Component Value Date/Time   K 3.9 05/29/2018 0358   MG 2.0 05/29/2018 0358     Plan: Potassium: -potassium has already been replaced (89meq given)  Magnesium: Mg 1.8-2: Mg 2 gm IV x1 to prevent Mg from dropping below 1.8 - do not need to recheck Mg    Thank you for allowing pharmacy to participate in this patient's care   Hildred Laser, PharmD Clinical Pharmacist **Pharmacist phone directory can now be found on [email or web address] ([email or web address]).  Listed under [place].

## 2018-05-29 NOTE — Care Management Note (Addendum)
Case Management Note  Patient Details  Name: Donald [name] MRN: [id number] Date of Birth: 26-Nov-1932  Subjective/Objective:  Patient presented for Persistent Atrial Fib-Tikosyn Load.                   Action/Plan: Benefits Check submitted for Tikosyn. Will make the patient aware of cost. Pt can utilize the [facility]arm[facility] for 7 day Rx for Tikosyn. Pt will utilize [place] [facility] with [facility] for Tikosyn Refills 30 day supply. Good RX cost states $29.99. CM will continue to monitor for additional transition of care needs.    Expected Discharge Date:                  Expected Discharge Plan:  Home/Self Care  In-House Referral:  NA  Discharge planning Services  CM Consult, Medication Assistance  Post Acute Care Choice:  NA Choice offered to:  NA  DME Arranged:  N/A DME Agency:  NA  HH Arranged:  NA HH Agency:  NA  Status of Service:  Completed, signed off  If discussed at [place] of Stay Meetings, dates discussed:    Additional Comments: 1030 05-30-18 Jacqlyn Krauss, [place] [phone] Rx for 7 day supply to be e-scribed to [facility]Pharmacy.   Tikosyn 30 day Rx can either be sent to [place] @ [facility] vs [facility], [place]. Pt may need Paper Rx's to continue with. A-Fib Clinic suggested patient use [facility] and he may need to shop around for cheaper cost @ different pharmacies. Jacqlyn Krauss, RN, BSN Case Manager [phone]    1134 05-29-18 Marshayla Mitschke [place], [place] [phone] CM called [facility] and the pharmacy does not have any in stock. States not available to order. CM will relay information to patient for a change in pharmacy. CM called [place] @ [facility] and Dofetilide can be ordered. [facility] can order medications as well.  Bethena Roys, RN 05/29/2018, 11:01 AM

## 2018-05-29 NOTE — Progress Notes (Addendum)
Progress Note  Patient Name: Donald [name] Date of Encounter: 05/29/2018  Primary Cardiologist: Virl Axe, MD   Subjective   Feels well, no rest SOB  Inpatient Medications    Scheduled Meds: . apixaban  5 mg Oral BID  . atorvastatin  40 mg Oral q1800  . diltiazem  120 mg Oral BID  . dofetilide  500 mcg Oral BID  . isosorbide mononitrate  30 mg Oral Daily  . magnesium oxide  400 mg Oral QHS  . metoprolol tartrate  25 mg Oral BID  . multivitamin  1 tablet Oral BID  . pantoprazole  40 mg Oral Daily  . sodium chloride flush  3 mL Intravenous Q12H   Continuous Infusions: . sodium chloride    . magnesium sulfate 1 - 4 g bolus IVPB     PRN Meds: sodium chloride, sodium chloride flush   Vital Signs    Vitals:   05/28/18 1133 05/28/18 2005 05/29/18 0639 05/29/18 0821  BP: (!) 124/91 113/82 119/83 111/90  Pulse: (!) 129 (!) 131 (!) 119   Resp:   16   Temp: 98.4 F (36.9 C) 97.9 F (36.6 C) 97.8 F (36.6 C)   TempSrc: Oral Oral Oral   SpO2: 99% 96% 99%   Weight:   96.8 kg     Intake/Output Summary (Last 24 hours) at 05/29/2018 0850 Last data filed at 05/29/2018 0641 Gross per 24 hour  Intake 360 ml  Output 600 ml  Net -240 ml   Last 3 Weights 05/29/2018 05/28/2018 05/24/2018  Weight (lbs) 213 lb 6.4 oz 223 lb 222 lb 9.6 oz  Weight (kg) 96.798 kg 101.152 kg 100.971 kg      Telemetry    AFib 110's-120's, occasional paced beats - Personally Reviewed  ECG    AFib 86bpm, reviewed with Dr. Caryl Comes, QT is OK - Personally Reviewed  Physical Exam   GEN: No acute distress.   Neck: No JVD Cardiac: irreg-irreg, tachycardic, no murmurs, rubs, or gallops.  Respiratory: CTA b/l. GI: Soft, nontender, non-distended  MS: trace edema; No deformity. Neuro:  Nonfocal  Psych: Normal affect   Labs    Chemistry Recent Labs  Lab 05/28/18 1021 05/29/18 0358  NA 138 139  K 4.6 3.9  CL 107 105  CO2 23 26  GLUCOSE 106* 97  BUN 23 19  CREATININE 0.93 1.11  CALCIUM  9.1 9.1  GFRNONAA >60 >60  GFRAA >60 >60  ANIONGAP 8 8     Hematology Recent Labs  Lab 05/28/18 1021  WBC 8.2  RBC 4.15*  HGB 13.6  HCT 40.4  MCV 97.3  MCH 32.8  MCHC 33.7  RDW 12.7  PLT 187    Cardiac EnzymesNo results for input(s): TROPONINI in the last 168 hours. No results for input(s): TROPIPOC in the last 168 hours.   BNPNo results for input(s): BNP, PROBNP in the last 168 hours.   DDimer No results for input(s): DDIMER in the last 168 hours.   Radiology    No results found.  Cardiac Studies    06/09/17: TTE Study Conclusions - Left ventricle: The cavity size was normal. Systolic function was   normal. The estimated ejection fraction was in the range of 55%   to 60%. Wall motion was normal; there were no regional wall   motion abnormalities. There was a reduced contribution of atrial   contraction to ventricular filling, due to atrial contractile   dysfunction (e.g. stunning after recent atrial fibrillation).Marland Kitchen  Doppler parameters are consistent with elevated mean left atrial   filling pressure. - Aortic valve: Right coronary cusp mobility was mildly restricted.   There was mild to moderate regurgitation directed centrally in   the LVOT. - Mitral valve: There was mild to moderate regurgitation directed   centrally. - Left atrium: The atrium was moderately dilated. - Right ventricle: The cavity size was mildly dilated. Wall   thickness was normal. - Right atrium: The atrium was moderately dilated. - Pulmonary arteries: PA peak pressure: 31 mm Hg (S).   Patient Profile     85 y.o. male w/PMHx of tachy-brady w/PPM, OSA w.CPAP, HTN, HLD, AFlutter (ablated historically) and AFib, admitted for Tikosyn initiation  Assessment & Plan    1. Persistent AFib     CHA2DS2Vasc is 5, on Eliquis     K+ 3.9, replaced     Mag 2.0     Creat 1.11 (Calc crCl is 72)  For DCCV tomorrow if not in SR, patient aware and agreeable       2. HTN     Continue home  meds  3. OSA     Uses CPAP  4. Fluid OL     Likely 2/2 fast AF     One more dose of IV lasix scheduled     Planned for new echo while here    For questions or updates, please contact [place] Please consult [email or web address] for contact info under        Signed, Baldwin Jamaica, PA-C  05/29/2018, 8:50 AM

## 2018-05-29 NOTE — Consult Note (Addendum)
[facility] CM Primary Care Navigator  05/29/2018  Donald [name] [birth year] [id number]     Met with patient at the bedside to identify possible discharge needs.  Patient reportshaving increased "shortness of breath and getting easily tired", was admitted for Tikosyn initiation.(persistent atrial fibrillation, atrial flutter)  Patient mentioned thathe is [place] for 2 years.  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   Patientstates using [facility] [employer] to obtain medications without any problem.  Patient has been managing his own medications with use of "pill box" system filled every week.    Patientverbalized that he has been driving prior to admission but his wife Lenard Forth) will be able to transport him to his doctors' appointments if needed after discharge.  Patient lives with wife who will serve as his primary caregiver if needed after discharge.  Anticipated discharge planishome per patient. ([facility])   Patient was encouraged to seehis primary care provider for follow-up once hereturns back to facility for a post discharge follow-upwithin1- 2 weeksor sooner if needs arise.Patient letter (with PCP's contact number) was provided asareminder.  Explained to patientabout [facility] CM services available for health managementand resources,but hevoiced having no pressing concerns orneeds at this point.Patientmentioned of being able to get help from the facility staffto checkon him andassisthim in managing his health needs whenever needed. He also verbalized [facility].  Patient hadpolitelydeclined [facility]-CM services offered to him which includes EMMIcalls to follow-up withhisrecovery at home. Patientwas encouragedto seekreferral to [facility] care management fromproviderifdeemed necessary and appropriate  forany services in thenearfuture.    [facility] care management information provided for future needs that he may have.    For additional questions please contact:  Edwena Felty A. Kriste Broman, BSN, RN-BC [facility] PRIMARY CARE Navigator Cell: [phone]

## 2018-05-29 NOTE — Progress Notes (Signed)
  Echocardiogram 2D Echocardiogram has been performed.  Donald [name] 05/29/2018, 4:04 PM

## 2018-05-30 ENCOUNTER — Encounter (HOSPITAL_COMMUNITY)
Admission: RE | Disposition: A | Discharge status: Home / Self Care | Payer: Self-pay | Source: Ambulatory Visit | Attending: Internal Medicine

## 2018-05-30 LAB — BASIC METABOLIC PANEL
Anion gap: 8 (ref 5–15)
BUN: 24 mg/dL — ABNORMAL HIGH (ref 8–23)
CHLORIDE: 105 mmol/L (ref 98–111)
CO2: 26 mmol/L (ref 22–32)
Calcium: 9 mg/dL (ref 8.9–10.3)
Creatinine, Ser: 1.31 mg/dL — ABNORMAL HIGH (ref 0.61–1.24)
GFR calc Af Amer: 58 mL/min — ABNORMAL LOW (ref >60)
GFR calc non Af Amer: 50 mL/min — ABNORMAL LOW (ref >60)
Glucose, Bld: 104 mg/dL — ABNORMAL HIGH (ref 70–99)
Potassium: 4.2 mmol/L (ref 3.5–5.1)
Sodium: 139 mmol/L (ref 135–145)

## 2018-05-30 LAB — MAGNESIUM: Magnesium: 2.3 mg/dL (ref 1.7–2.4)

## 2018-05-30 SURGERY — CARDIOVERSION
Anesthesia: General

## 2018-05-30 NOTE — Progress Notes (Signed)
Progress Note  Patient Name: Donald [name] Date of Encounter: 05/30/2018  Primary Cardiologist: Virl Axe, MD   Subjective   Converted spontaneously overnight and feels better today  Inpatient Medications    Scheduled Meds: . apixaban  5 mg Oral BID  . atorvastatin  40 mg Oral q1800  . diltiazem  120 mg Oral BID  . dofetilide  500 mcg Oral BID  . isosorbide mononitrate  30 mg Oral Daily  . magnesium oxide  400 mg Oral QHS  . metoprolol tartrate  25 mg Oral BID  . multivitamin  1 tablet Oral BID  . pantoprazole  40 mg Oral Daily  . sodium chloride flush  3 mL Intravenous Q12H  . sodium chloride flush  3 mL Intravenous Q12H   Continuous Infusions: . sodium chloride    . sodium chloride     PRN Meds: sodium chloride, hydrocortisone cream, sodium chloride flush, sodium chloride flush   Vital Signs    Vitals:   05/29/18 1939 05/30/18 0412 05/30/18 0414 05/30/18 0923  BP: 115/76 109/62  120/74  Pulse: 69 60    Resp:  18    Temp: 97.6 F (36.4 C)  97.8 F (36.6 C)   TempSrc: Oral  Oral   SpO2: 97% 98%    Weight:   97 kg     Intake/Output Summary (Last 24 hours) at 05/30/2018 0958 Last data filed at 05/30/2018 0416 Gross per 24 hour  Intake 407.67 ml  Output 1925 ml  Net -1517.33 ml   Last 3 Weights 05/30/2018 05/29/2018 05/28/2018  Weight (lbs) 213 lb 12.8 oz 213 lb 6.4 oz 223 lb  Weight (kg) 96.979 kg 96.798 kg 101.152 kg      Telemetry    Apacing @ 60   ECG    SR with QT about 440 and QTc about 485  Physical Exam   Well developed and nourished in no acute distress HENT normal Neck supple with JVP-flat Clear Regular rate and rhythm, no murmurs or gallops Abd-soft with active BS No Clubbing cyanosis edema Skin-warm and dry A & Oriented  Grossly normal sensory and motor function t   Labs    Chemistry Recent Labs  Lab 05/28/18 1021 05/29/18 0358 05/30/18 0357  NA 138 139 139  K 4.6 3.9 4.2  CL 107 105 105  CO2 23 26 26   GLUCOSE  106* 97 104*  BUN 23 19 24*  CREATININE 0.93 1.11 1.31*  CALCIUM 9.1 9.1 9.0  GFRNONAA >60 >60 50*  GFRAA >60 >60 58*  ANIONGAP 8 8 8      Hematology Recent Labs  Lab 05/28/18 1021  WBC 8.2  RBC 4.15*  HGB 13.6  HCT 40.4  MCV 97.3  MCH 32.8  MCHC 33.7  RDW 12.7  PLT 187    Cardiac EnzymesNo results for input(s): TROPONINI in the last 168 hours. No results for input(s): TROPIPOC in the last 168 hours.   BNPNo results for input(s): BNP, PROBNP in the last 168 hours.   DDimer No results for input(s): DDIMER in the last 168 hours.   Radiology    No results found.  Cardiac Studies    06/09/17: TTE Study Conclusions - Left ventricle: The cavity size was normal. Systolic function was   normal. The estimated ejection fraction was in the range of 55%   to 60%. Wall motion was normal; there were no regional wall   motion abnormalities. There was a reduced contribution of atrial   contraction  to ventricular filling, due to atrial contractile   dysfunction (e.g. stunning after recent atrial fibrillation)..   Doppler parameters are consistent with elevated mean left atrial   filling pressure. - Aortic valve: Right coronary cusp mobility was mildly restricted.   There was mild to moderate regurgitation directed centrally in   the LVOT. - Mitral valve: There was mild to moderate regurgitation directed   centrally. - Left atrium: The atrium was moderately dilated. - Right ventricle: The cavity size was mildly dilated. Wall   thickness was normal. - Right atrium: The atrium was moderately dilated. - Pulmonary arteries: PA peak pressure: 31 mm Hg (S).   Patient Profile     85 y.o. male w/PMHx of tachy-brady w/PPM, OSA w.CPAP, HTN, HLD, AFlutter (ablated historically) and AFib, admitted for Tikosyn initiation  Assessment & Plan   Atrial fibrillation/flutter-persistent CHADS-VASc score 5 (age-30 hypertension-1 cardiomyopathy-1 vascular disease-1)  Dofetilide therapy with  conversion   Aortic Valve Bicuspid previously described but no longer evident  Cardiomyopathy-resolved  Congestive heart failure acute/chronic question systolic versus diastolic  Pacemaker-[employer]  Hypertension  Exertional chest discomfort Cath non obstructive disease   Converted spontaneously   Anticipate home tomorrow  LV function looks great  Tricuspid valve, and have reached out to Dr Raynaldo Opitz to help with AoV gradient  BP well controlled    For questions or updates, please contact [facility] Please consult [email or web address] for contact info under        Signed, Virl Axe, MD  05/30/2018, 9:58 AM

## 2018-05-30 NOTE — Progress Notes (Signed)
Pharmacy Consult for [place] Electrolyte Replacement  Pharmacy consulted to assist in monitoring and replacing electrolytes in this 85 y.o. male admitted on 05/28/2018 undergoing dofetilide initiation. First dofetilide dose: 1/13 at 8pm  Labs:    Component Value Date/Time   K 4.2 05/30/2018 0357   MG 2.3 05/30/2018 0357     Plan: Potassium: K >/= 4: No additional supplementation needed  Magnesium: Mg >2: No additional supplementation needed   Thank you for allowing pharmacy to participate in this patient's care   Hildred Laser, PharmD Clinical Pharmacist **Pharmacist phone directory can now be found on [email or web address] ([email or web address]).  Listed under [place].

## 2018-05-31 LAB — ANTI-DNA ANTIBODY, DOUBLE-STRANDED: ds DNA Ab: 2 IU/mL

## 2018-05-31 LAB — ANCA SCREEN W REFLEX TITER: ANCA Screen: NEGATIVE

## 2018-05-31 LAB — CK TOTAL AND CKMB (NOT AT ARMC)
CK, MB: 2.5 ng/mL (ref 0–5.0)
Relative Index: 4.1 — ABNORMAL HIGH (ref 0–4.0)
Total CK: 61 U/L (ref 44–196)

## 2018-05-31 LAB — CYCLIC CITRUL PEPTIDE ANTIBODY, IGG: Cyclic Citrullin Peptide Ab: <16 UNITS

## 2018-05-31 LAB — HYPERSENSITIVITY PNUEMONITIS PROFILE
ASPERGILLUS FUMIGATUS: NEGATIVE
Faenia retivirgula: NEGATIVE
Pigeon Serum: NEGATIVE
S. VIRIDIS: NEGATIVE
T. CANDIDUS: NEGATIVE
T. VULGARIS: NEGATIVE

## 2018-05-31 LAB — MPO/PR-3 (ANCA) ANTIBODIES: Myeloperoxidase Abs: <1 AI

## 2018-05-31 LAB — ANA: Anti Nuclear Antibody(ANA): NEGATIVE

## 2018-05-31 LAB — MAGNESIUM: Magnesium: 2.1 mg/dL (ref 1.7–2.4)

## 2018-05-31 LAB — BASIC METABOLIC PANEL
Anion gap: 7 (ref 5–15)
BUN: 21 mg/dL (ref 8–23)
CHLORIDE: 107 mmol/L (ref 98–111)
CO2: 26 mmol/L (ref 22–32)
Calcium: 9.1 mg/dL (ref 8.9–10.3)
Creatinine, Ser: 1.13 mg/dL (ref 0.61–1.24)
GFR calc Af Amer: >60 mL/min (ref >60)
GFR calc non Af Amer: 60 mL/min — ABNORMAL LOW (ref >60)
Glucose, Bld: 98 mg/dL (ref 70–99)
POTASSIUM: 4.2 mmol/L (ref 3.5–5.1)
Sodium: 140 mmol/L (ref 135–145)

## 2018-05-31 LAB — SJOGREN'S SYNDROME ANTIBODS(SSA + SSB)
SSA (RO) (ENA) ANTIBODY, IGG: NEGATIVE AI
SSB (LA) (ENA) ANTIBODY, IGG: NEGATIVE AI

## 2018-05-31 LAB — ALDOLASE: Aldolase: 5.3 U/L (ref <=8.1)

## 2018-05-31 LAB — RHEUMATOID FACTOR: Rhuematoid fact SerPl-aCnc: 14 IU/mL — ABNORMAL HIGH (ref <14)

## 2018-05-31 LAB — ANTI-SCLERODERMA ANTIBODY: Scleroderma (Scl-70) (ENA) Antibody, IgG: <1 AI

## 2018-05-31 MED ORDER — DOFETILIDE 500 MCG PO CAPS: 500.0000 ug | ORAL_CAPSULE | Freq: Two times a day (BID) | ORAL | 6 refills | Status: DC

## 2018-05-31 MED ORDER — DOFETILIDE 500 MCG PO CAPS: 500.0000 ug | ORAL_CAPSULE | Freq: Two times a day (BID) | ORAL | 0 refills | Status: DC

## 2018-05-31 MED FILL — TIKOSYN 500 MCG CAPS: 500 | 7 days supply | Qty: 14 | Fill #0

## 2018-05-31 NOTE — Progress Notes (Signed)
Pharmacy Consult for Donald [name] Electrolyte Replacement  Pharmacy consulted to assist in monitoring and replacing electrolytes in this 85 y.o. male admitted on 05/28/2018 undergoing dofetilide initiation. First dofetilide dose: 8pm on 1/13  Labs:    Component Value Date/Time   K 4.2 05/31/2018 0500   MG 2.1 05/31/2018 0500     Plan: Potassium: K >/= 4: No additional supplementation needed  Magnesium: Mg >2: No additional supplementation needed   -No scheduled potassium supplementation suggested upon discharge  Thank you for allowing pharmacy to participate in this patient's care   Hildred Laser, PharmD Clinical Pharmacist **Pharmacist phone directory can now be found on [email or web address] ([email or web address]).  Listed under [place].

## 2018-05-31 NOTE — Discharge Summary (Signed)
ELECTROPHYSIOLOGY PROCEDURE DISCHARGE SUMMARY    Patient ID: Donald [name],  MRN: [id number], DOB/AGE: 83-Dec-1934 83 y.o.  Admit date: 05/28/2018 Discharge date: 05/31/2018  Primary Care Physician: Janith Lima, MD  Primary Cardiologist/Electrophysiologist: Dr. Caryl Comes  Primary Discharge Diagnosis:  1.  Persistent atrial fibrillation status post Tikosyn loading this admission      CHA2DS2Vasc is 5, on Eliquis, appropriately dosed  Secondary Discharge Diagnosis:  1. HTN 2. OSA w/CPAP 3. Tachy-brady w/PPM   No Known Allergies   Procedures This Admission:  1.  Tikosyn loading   Brief HPI: Donald [name] is a 85 y.o. male with a past medical history as noted above.  They were referred to EP in the outpatient setting for treatment options of atrial fibrillation.  Risks, benefits, and alternatives to Tikosyn were reviewed with the patient who wished to proceed.    Hospital Course:  The patient was admitted and Tikosyn was initiated.  Renal function and electrolytes were followed during the hospitalization.  His remained stable.  The patient converted with drug and did not require DCCV.  His echo was updated while here, LVEF 60-65%, LA mod dilated, measured 52mm.  He was monitored until discharge on telemetry which demonstrated AV paced predominantly.  On the day of discharge, the patient feels well, he was examined by Dr Caryl Comes who considered him stable for discharge to home.  Follow-up has been arranged with [facility] pharmacists in 1 week and with Dr Caryl Comes in 4 weeks.   Physical Exam: Vitals:   05/30/18 1414 05/30/18 1956 05/31/18 0533 05/31/18 0810  BP: 105/70 110/71 112/69 117/80  Pulse: 63 64 (!) 59   Resp:   17   Temp:  97.8 F (36.6 C) 97.6 F (36.4 C)   TempSrc:  Oral Oral   SpO2: 98% 98% 97%   Weight:   96.2 kg     GEN- The patient is well appearing, alert and oriented x 3 today.   HEENT: normocephalic, atraumatic; sclera clear, conjunctiva pink; hearing intact;  oropharynx clear; neck supple, no JVP Lymph- no cervical lymphadenopathy Lungs-  CTA b/l, normal work of breathing.  No wheezes, rales, rhonchi Heart- RRR, no murmurs, rubs or gallops, PMI not laterally displaced GI- soft, non-tender, non-distended Extremities- no clubbing, cyanosis, or edema MS- no significant deformity or atrophy Skin- warm and dry, no rash or lesion Psych- euthymic mood, full affect Neuro- strength and sensation are intact   Labs:   Lab Results  Component Value Date   WBC 8.2 05/28/2018   HGB 13.6 05/28/2018   HCT 40.4 05/28/2018   MCV 97.3 05/28/2018   PLT 187 05/28/2018    Recent Labs  Lab 05/31/18 0500  NA 140  K 4.2  CL 107  CO2 26  BUN 21  CREATININE 1.13  CALCIUM 9.1  GLUCOSE 98     Discharge Medications:  Allergies as of 05/31/2018   No Known Allergies     Medication List    TAKE these medications   atorvastatin 40 MG tablet Commonly known as:  LIPITOR TAKE 1 TABLET ONCE DAILY. What changed:    how much to take  how to take this  when to take this  additional instructions   diltiazem 120 MG 24 hr capsule Commonly known as:  CARDIZEM CD Take 1 capsule (120 mg total) by mouth 2 (two) times daily.   dofetilide 500 MCG capsule Commonly known as:  TIKOSYN Take 1 capsule (500 mcg total) by mouth 2 (  two) times daily.   ELIQUIS 5 MG Tabs tablet Generic drug:  apixaban TAKE 1 TABLET BY MOUTH TWICE DAILY. What changed:  how much to take   ferrous sulfate 325 (65 FE) MG tablet Take 1 tablet (325 mg total) by mouth daily with breakfast.   furosemide 20 MG tablet Commonly known as:  LASIX Take 20 mg by mouth daily.   isosorbide mononitrate 30 MG 24 hr tablet Commonly known as:  IMDUR TAKE 1 TABLET BY MOUTH DAILY.   Magnesium 65 MG Tabs Take 6 tablets by mouth at bedtime.   metoprolol tartrate 50 MG tablet Commonly known as:  LOPRESSOR Take 25 mg by mouth 2 (two) times daily.   metroNIDAZOLE 0.75 % gel Commonly known  as:  METROGEL Apply 1 application topically daily as needed (for rosacea).   MULTIVITAMIN PO Take 1 tablet by mouth 2 (two) times daily.   EYE VITAMINS Caps Take 1 capsule by mouth 2 (two) times daily.   nitroGLYCERIN 0.4 MG SL tablet Commonly known as:  NITROSTAT PLACE 1 TABLET UNDER THE TONGUE EVERY 5 MINUTES AS NEEDED FOR CHEST PAIN, MAY REPEAT FOR 3 DOSES. What changed:  See the new instructions.   pantoprazole 40 MG tablet Commonly known as:  PROTONIX Take 1 tablet (40 mg total) by mouth daily.       Disposition: Home Discharge Instructions    Diet - low sodium heart healthy   Complete by:  As directed    Increase activity slowly   Complete by:  As directed      Follow-up Information    [facility] Follow up.   Specialty:  Cardiology Why:  06/07/2018 @ 10:00AM Contact information: [address] [id number] mc [address] [place] [address] [phone]       Deboraha Sprang, MD Follow up.   Specialty:  Cardiology Why:  07/02/2018 @ 9:30AM Contact information: [address] [address] [phone]           Duration of Discharge Encounter: Greater than 30 minutes including physician time.  Venetia Night, PA-C 05/31/2018 12:13 PM

## 2018-05-31 NOTE — Discharge Instructions (Signed)

## 2018-06-01 ENCOUNTER — Telehealth: Payer: Self-pay | Admitting: *Deleted

## 2018-06-01 NOTE — Telephone Encounter (Signed)
Pt was on TCM report admitted 05/28/18 for Persistent atrial fibrillation. Risks, benefits, and alternatives to Tikosyn were reviewed with the patient who wished to proceed w/procedure.Pt  CHA2DS2Vasc is 5, on Eliquis. Pt was monitored and D/C 05/31/18 and will follow-up with [facility] pharmacists in 1 week and with Dr Caryl Comes in 4 weeks...Donald [name]

## 2018-06-07 ENCOUNTER — Encounter (HOSPITAL_COMMUNITY): Payer: Self-pay | Admitting: Nurse Practitioner

## 2018-06-07 ENCOUNTER — Encounter (HOSPITAL_COMMUNITY): Payer: Self-pay

## 2018-06-07 ENCOUNTER — Ambulatory Visit (HOSPITAL_COMMUNITY)
Admission: RE | Admit: 2018-06-07 | Discharge: 2018-06-07 | Disposition: A | Discharge status: Home / Self Care | Payer: PPO | Source: Ambulatory Visit | Attending: Nurse Practitioner | Admitting: Nurse Practitioner

## 2018-06-07 VITALS — BP 110/58 | HR 65 | Ht 68.0 in | Wt 225.0 lb

## 2018-06-07 DIAGNOSIS — Z79899 Other long term (current) drug therapy: Secondary | ICD-10-CM | POA: Diagnosis not present

## 2018-06-07 DIAGNOSIS — Z87891 Personal history of nicotine dependence: Secondary | ICD-10-CM | POA: Diagnosis not present

## 2018-06-07 DIAGNOSIS — I4819 Other persistent atrial fibrillation: Secondary | ICD-10-CM | POA: Diagnosis not present

## 2018-06-07 DIAGNOSIS — Z7901 Long term (current) use of anticoagulants: Secondary | ICD-10-CM | POA: Diagnosis not present

## 2018-06-07 DIAGNOSIS — Z85820 Personal history of malignant melanoma of skin: Secondary | ICD-10-CM | POA: Diagnosis not present

## 2018-06-07 DIAGNOSIS — E785 Hyperlipidemia, unspecified: Secondary | ICD-10-CM | POA: Diagnosis not present

## 2018-06-07 DIAGNOSIS — I480 Paroxysmal atrial fibrillation: Secondary | ICD-10-CM | POA: Insufficient documentation

## 2018-06-07 DIAGNOSIS — Z950 Presence of cardiac pacemaker: Secondary | ICD-10-CM | POA: Diagnosis not present

## 2018-06-07 DIAGNOSIS — I10 Essential (primary) hypertension: Secondary | ICD-10-CM | POA: Insufficient documentation

## 2018-06-07 DIAGNOSIS — K219 Gastro-esophageal reflux disease without esophagitis: Secondary | ICD-10-CM | POA: Insufficient documentation

## 2018-06-07 DIAGNOSIS — I429 Cardiomyopathy, unspecified: Secondary | ICD-10-CM | POA: Insufficient documentation

## 2018-06-07 DIAGNOSIS — G4733 Obstructive sleep apnea (adult) (pediatric): Secondary | ICD-10-CM | POA: Diagnosis not present

## 2018-06-07 DIAGNOSIS — I495 Sick sinus syndrome: Secondary | ICD-10-CM | POA: Diagnosis not present

## 2018-06-07 DIAGNOSIS — I2510 Atherosclerotic heart disease of native coronary artery without angina pectoris: Secondary | ICD-10-CM | POA: Insufficient documentation

## 2018-06-07 DIAGNOSIS — I4891 Unspecified atrial fibrillation: Secondary | ICD-10-CM

## 2018-06-07 DIAGNOSIS — Z809 Family history of malignant neoplasm, unspecified: Secondary | ICD-10-CM | POA: Insufficient documentation

## 2018-06-07 LAB — BASIC METABOLIC PANEL
Anion gap: 7 (ref 5–15)
BUN: 21 mg/dL (ref 8–23)
CO2: 23 mmol/L (ref 22–32)
Calcium: 8.8 mg/dL — ABNORMAL LOW (ref 8.9–10.3)
Chloride: 108 mmol/L (ref 98–111)
Creatinine, Ser: 1.04 mg/dL (ref 0.61–1.24)
GFR calc Af Amer: >60 mL/min (ref >60)
GFR calc non Af Amer: >60 mL/min (ref >60)
Glucose, Bld: 105 mg/dL — ABNORMAL HIGH (ref 70–99)
Potassium: 4.7 mmol/L (ref 3.5–5.1)
Sodium: 138 mmol/L (ref 135–145)

## 2018-06-07 LAB — MAGNESIUM: Magnesium: 1.8 mg/dL (ref 1.7–2.4)

## 2018-06-07 NOTE — Progress Notes (Signed)
Primary Care Physician: Janith Lima, MD Referring Physician: Dr. Jerrell Belfast Donald [name] is a 85 y.o. male with a h/o atrial flutter,afib, PPM, for tachy/brady syndrome, s/p ablation, OSA on cpap, alcohol use, cardiomyopathy, that was set up for cardioversion by Dr. Caryl Comes, but reported to the endo group that he had missed a dose of Eliquis, so DCCV was cancelled. He is now back in the afib clinic to get set up for cardioversion. He will be eligible for cardioversion the first of the week, not have missing any anticoagulation x 3 weeks. He remains in atrial flutter with RVR.  F/u from unsuccessful cardioversion, 9/9.He reports that his heart rate became regular and in the 60's 2 hours after cardioversion and he has been staying in SR since then. I discussed with Dr. Caryl Comes and instead of starting antiarrythmic, will see if pt continues in SR for now since he has not had frequent breakthrough episodes in the past. He feels improved.  F/u in afib clinic, 11/21. He went back into afib  last night around 5 pm. He continues in afib with RVR at 114 bpm, but in the office on pulse ox he is running around 80-90's at rest. He has reduced his nightly alcohol from 4 drinks a night but is still  drinking 2 drinks nightly. I believe he will  need an antiarrythmic and will confer with Dr. Caryl Comes which he drug he  would like to use. He  is using cpap.  F/u in afib clinic, 11/27. After discussion with Dr. Caryl Comes he feels that pt will be an amiodarone or Tikosyn candidate. He has  mod CAD and structural heart disease so will not use flecainide. After details given on both drugs, he feels that he would prefer amiodarone as he does not want to spend 4 days in the hospital. He does not have any significant lung disease, already is a pt of Tammy Parrett and Dr. Elsworth Soho mostly for treatment of sleep apnea, and I will ask if they need to do any additional testing/concerns with plans for amiodarone use. He does not have thyroid  or lung issues.  F/u in afib clinic, his lung CT showed possible early findings of ILD. Tammy Parrett, did not favor use of amiodarone, if so, would have to have lung testing every 6 months. He is now ready to listen to her advice  and would like to come  in for Tikosyn after the first of the year. He feels he can afford drug.   F/u in afib clinic,05/28/2018, for admission for dofetilide. He is not on any qtc prolonging, no benadryl. No missed eliquis doses. Aware of price of drug.   F/u in afib clinic,1/23, one week after dofetilide admit. He did convert with the drug. He is in SR today. He feels much improved to the point that he has resumed daily walking. He was able to buy dofetilide thru [facility] [employer] at $30 a month.  Today, he denies symptoms of palpitations, chest pain, shortness of breath, orthopnea, PND, lower extremity edema, dizziness, presyncope, syncope, or neurologic sequela. The patient is tolerating medications without difficulties and is otherwise without complaint today.   Past Medical History:  Diagnosis Date  . Abnormal CT scan, stomach 02/2014   Thickening of gastric fundus and cardia.  gastritis on EGD 03/2014  . Allergic rhinitis due to pollen   . Arthropathy, unspecified, site unspecified   . Atherosclerosis    a. Noted by abdominal CT  02/2014 (h/o normal nuc 2011).  . Atrial flutter ([place]) 02/24/14   s/p ablation 11/15  . Cardiomyopathy ([place])    tachy mediated - a. TEE (10/15):  EF 30%;  b. Echo after NSR restored (10/15):  mild LVH, EF 55-60%, mild AS, mild AI, mild MR, mild to mod LAE, mild RAE  . Diverticulosis    severe in descending, sigmoid colon.   . ED (erectile dysfunction)   . Esophageal stricture 03/2014   traversable with endoscope. not dilated.   . Gastric AVM   . GERD (gastroesophageal reflux disease) 03/2014   small HH and gastritis on EGD  . GI bleed   . Habitual alcohol use   . Hemorrhoids   . Hiatal hernia   . Hyperlipidemia   .  Hypertension   . Obesity   . OSA on CPAP   . PAF (paroxysmal atrial fibrillation) ([place])   . Prostatitis, unspecified   . 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 Skin cancer    basal and squamous cell   Past Surgical History:  Procedure Laterality Date  . ATRIAL FLUTTER ABLATION N/A 03/19/2014   RFCA of atrial flutter by Dr Caryl Comes  . CARDIOVERSION N/A 02/24/2014   Procedure: CARDIOVERSION;  Surgeon: Lelon Perla, MD;  Location: [facility];  Service: Cardiovascular;  Laterality: N/A;  . CARDIOVERSION Right 09/10/2014   Procedure: CARDIOVERSION;  Surgeon: Deboraha Sprang, MD;  Location: [facility] CATH LAB;  Service: Cardiovascular;  Laterality: Right;  . CARDIOVERSION N/A 01/22/2018   Procedure: CARDIOVERSION;  Surgeon: Fay Records, MD;  Location: [facility] ENDOSCOPY;  Service: Cardiovascular;  Laterality: N/A;  . COLONOSCOPY N/A 04/23/2015   Procedure: COLONOSCOPY;  Surgeon: Ladene Artist, MD;  Location: [facility] ENDOSCOPY;  Service: Endoscopy;  Laterality: N/A;  . ENTEROSCOPY N/A 06/12/2015   Procedure: ENTEROSCOPY;  Surgeon: Ladene Artist, MD;  Location: [facility] ENDOSCOPY;  Service: Endoscopy;  Laterality: N/A;  . EP IMPLANTABLE DEVICE N/A 02/15/2016   Procedure: Pacemaker Implant;  Surgeon: Evans Lance, MD;  Location: [place] CV LAB;  Service: Cardiovascular;  Laterality: N/A;  . ESOPHAGOGASTRODUODENOSCOPY N/A 04/09/2014   Procedure: ESOPHAGOGASTRODUODENOSCOPY (EGD);  Surgeon: Inda Castle, MD;  Location: [place];  Service: Endoscopy;  Laterality: N/A;  . ESOPHAGOGASTRODUODENOSCOPY N/A 04/23/2015   Procedure: ESOPHAGOGASTRODUODENOSCOPY (EGD);  Surgeon: Ladene Artist, MD;  Location: [facility] ENDOSCOPY;  Service: Endoscopy;  Laterality: N/A;  . INGUINAL HERNIA REPAIR     right  . INGUINAL HERNIA REPAIR     left  . LEFT HEART CATH AND CORONARY ANGIOGRAPHY N/A 03/16/2017   Procedure: LEFT HEART CATH AND CORONARY ANGIOGRAPHY;  Surgeon: Burnell Blanks, MD;  Location: [place]  CV LAB;  Service: Cardiovascular;  Laterality: N/A;  . ORIF fracture of the elbow    . SKIN CANCER EXCISION  05/21/12   Squamous cell ca  . TEE WITHOUT CARDIOVERSION N/A 02/24/2014   Procedure: TRANSESOPHAGEAL ECHOCARDIOGRAM (TEE);  Surgeon: Lelon Perla, MD;  Location[facility] ENDOSCOPY;  Service: Cardiovascular;  Laterality: N/A;  . TONSILLECTOMY      Current Outpatient Medications  Medication Sig Dispense Refill  . atorvastatin (LIPITOR) 40 MG tablet TAKE 1 TABLET ONCE DAILY. (Patient taking differently: Take 40 mg by mouth daily. ) 90 tablet 0  . diltiazem (CARDIZEM CD) 120 MG 24 hr capsule Take 1 capsule (120 mg total) by mouth 2 (two) times daily. 180 capsule 1  . dofetilide (TIKOSYN) 500 MCG capsule Take 1 capsule (  500 mcg total) by mouth 2 (two) times daily. 60 capsule 6  . ELIQUIS 5 MG TABS tablet TAKE 1 TABLET BY MOUTH TWICE DAILY. (Patient taking differently: Take 5 mg by mouth 2 (two) times daily. ) 180 tablet 3  . ferrous sulfate 325 (65 FE) MG tablet Take 1 tablet (325 mg total) by mouth daily with breakfast. 60 tablet 3  . furosemide (LASIX) 20 MG tablet Take 20 mg by mouth daily.    . isosorbide mononitrate (IMDUR) 30 MG 24 hr tablet TAKE 1 TABLET BY MOUTH DAILY. 90 tablet 3  . Magnesium 65 MG TABS Take 7 tablets by mouth at bedtime.     . metoprolol tartrate (LOPRESSOR) 50 MG tablet Take 25 mg by mouth 2 (two) times daily.    . metroNIDAZOLE (METROGEL) 0.75 % gel Apply 1 application topically daily as needed (for rosacea).     . Multiple Vitamins-Minerals (EYE VITAMINS) CAPS Take 1 capsule by mouth 2 (two) times daily.    . Multiple Vitamins-Minerals (MULTIVITAMIN PO) Take 1 tablet by mouth 2 (two) times daily.    . pantoprazole (PROTONIX) 40 MG tablet Take 1 tablet (40 mg total) by mouth daily. 90 tablet 3  . nitroGLYCERIN (NITROSTAT) 0.4 MG SL tablet PLACE 1 TABLET UNDER THE TONGUE EVERY 5 MINUTES AS NEEDED FOR CHEST PAIN, MAY REPEAT FOR 3 DOSES. (Patient not taking: No sig  reported) 25 tablet 5   No current facility-administered medications for this encounter.     No Known Allergies  Social History   Socioeconomic History  . Marital status: Married    Spouse name: Not on file  . Number of children: 3  . Years of education: 53  . Highest education level: Not on file  Occupational History  . Occupation: Therapist, sports: RETIRED  Social Needs  . Financial resource strain: Not hard at all  . Food insecurity:    Worry: Never true    Inability: Never true  . Transportation needs:    Medical: No    Non-medical: No  Tobacco Use  . Smoking status: Former Smoker    Packs/day: 0.00    Years: 40.00    Pack years: 0.00    Types: Cigarettes, Cigars    Last attempt to quit: 09/18/2008    Years since quitting: 9.7  . Smokeless tobacco: Former Systems developer    Types: Chew  . Tobacco comment: Has Occasional Cigar/ quit   Substance and Sexual Activity  . Alcohol use: Yes    Alcohol/week: 28.0 standard drinks    Types: 21 Glasses of wine, 7 Shots of liquor per week    Comment: states he does want to drink less and cut back on the amount.   . Drug use: No  . Sexual activity: Not Currently  Lifestyle  . Physical activity:    Days per week: 4 days    Minutes per session: 50 min  . Stress: Not at all  Relationships  . Social connections:    Talks on phone: More than three times a week    Gets together: More than three times a week    Attends religious service: More than 4 times per year    Active member of club or organization: Yes    Attends meetings of clubs or organizations: More than 4 times per year    Relationship status: Married  . Intimate partner violence:    Fear of current or ex partner: No    Emotionally  abused: No    Physically abused: No    Forced sexual activity: No  Other Topics Concern  . 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 married 1961. 2 sons- '64, '63 , I daughter- '66, 4  grandchildren. work: Museum/gallery curator, retired but still consults. 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      No new stressors/     Family History  Problem Relation Age of Onset  . Cancer Mother        ovarian  . Other Father        renal disease- grief over loss of spouse  . Cancer Brother        prostate- died of hematologic disorder 2nd to chemo  . Pulmonary fibrosis Brother   . Diabetes Neg Hx   . Coronary artery disease Neg Hx   . Colon cancer Neg Hx   . Stomach cancer Neg Hx   . Rectal cancer Neg Hx     ROS- All systems are reviewed and negative except as per the HPI above  Physical Exam: Vitals:   06/07/18 0938  BP: (!) 110/58  Pulse: 65  Weight: 102.1 kg  Height: 5\' 8"  (1.727 m)   Wt Readings from Last 3 Encounters:  06/07/18 102.1 kg  05/31/18 96.2 kg  05/28/18 101.2 kg    Labs: Lab Results  Component Value Date   NA 140 05/31/2018   K 4.2 05/31/2018   CL 107 05/31/2018   CO2 26 05/31/2018   GLUCOSE 98 05/31/2018   BUN 21 05/31/2018   CREATININE 1.13 05/31/2018   CALCIUM 9.1 05/31/2018   PHOS 2.7 02/14/2016   MG 2.1 05/31/2018   Lab Results  Component Value Date   INR 1.52 06/08/2017   Lab Results  Component Value Date   CHOL 107 06/08/2017   HDL 40.80 06/08/2017   LDLCALC 49 06/08/2017   TRIG 85.0 06/08/2017     GEN- The patient is well appearing, alert and oriented x 3 today.   Head- normocephalic, atraumatic Eyes-  Sclera clear, conjunctiva pink Ears- hearing intact Oropharynx- clear Neck- supple, no JVP Lymph- no cervical lymphadenopathy Lungs- Clear to ausculation bilaterally, normal work of breathing Heart- irregular rate and rhythm, no murmurs, rubs or gallops, PMI not laterally displaced GI- soft, NT, ND, + BS Extremities- no clubbing, cyanosis, or edema MS- no significant deformity or atrophy Skin- no rash or lesion Psych- euthymic mood, full  affect Neuro- strength and sensation are intact  EKG- a paced at 65 bpm, pr int 200 ms, qrs int 86 ms, qtc 463 ms   Chest CT-IMPRESSION: 1. There are subtle changes in the lungs, which could be indicative of interstitial lung disease. At this point, findings are classified as indeterminate for usual interstitial pneumonia (UIP) per current ATS guidelines. 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 If there's persistent clinical concern for interstitial lung disease, repeat high-resolution chest CT is recommended in 12 months to assess for temporal changes in the appearance of the lung parenchyma. 2. Mild diffuse bronchial wall thickening with mild centrilobular and paraseptal emphysema; imaging findings suggestive of underlying COPD. 3. Aortic atherosclerosis, in addition to left main and 3 vessel coronary artery disease(known CAD with prior LHC). 4. Left atrial enlargement. 5. There are calcifications of the aortic valve. Echocardiographic correlation for evaluation of potential valvular dysfunction may be warranted  if clinically indicated.Will need Echo when pt is back in SR to further assess AV. Echo- 1/23- Study Conclusions  - Left ventricle: The cavity size was normal. Wall thickness was   normal. Systolic function was normal. The estimated ejection   fraction was in the range of 60% to 65%. Left ventricular   diastolic function parameters were normal. 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 There was mild regurgitation. - Mitral valve: Calcified annulus. Mildly thickened leaflets .   There was mild regurgitation. - Left atrium: The atrium was moderately dilated. - Right ventricle: The cavity size was mildly dilated. - Atrial septum: No defect or patent foramen ovale was identified.   Assessment and Plan: 1. Atrial fib/ flutter Pt had unsuccessful cardioversion but went back in rhythm shortly after  cardioversion in September  Pt is now on dofetilide 500 mcgs bid as of last week and in SR with much improvement in energy, shortness of breath. Precautions with dofetilide reviewed Pt wanted  to avoid hospitalization and go with amiodarone, however, chest CT, showed possible early ILD so pt was then willing to pursue tikosyn ( brother died from pulmonary fibrosis) Continue eliquis 5 mg bid for CHA2DS2VASc score of 5, no interruption for the  next 4 weeks Will continue cardizem 120 mg bid and  metoprolol  25 mg bid   He was advised to decrease alcohol use to help discourage return of afib/flutter to no more than 2 a week  Continue to use Cpap to treat  OSA Bmet, mag today   2. PPM Per Dr. Caryl Comes and device clinc  3. HTN  Stable   F/u with Dr. Caryl Comes as scheduled 2/17   Geroge Baseman. Inman Fettig, [facility] [address] [place], [place] [address] [phone]

## 2018-06-14 ENCOUNTER — Other Ambulatory Visit: Payer: Self-pay | Admitting: Internal Medicine

## 2018-06-18 ENCOUNTER — Ambulatory Visit (HOSPITAL_COMMUNITY)
Admission: RE | Admit: 2018-06-18 | Discharge: 2018-06-18 | Disposition: A | Discharge status: Home / Self Care | Payer: PPO | Source: Ambulatory Visit | Attending: Nurse Practitioner | Admitting: Nurse Practitioner

## 2018-06-18 DIAGNOSIS — I4819 Other persistent atrial fibrillation: Secondary | ICD-10-CM | POA: Insufficient documentation

## 2018-06-18 LAB — MAGNESIUM: MAGNESIUM: 2.1 mg/dL (ref 1.7–2.4)

## 2018-06-24 ENCOUNTER — Other Ambulatory Visit: Payer: Self-pay | Admitting: Cardiology

## 2018-06-25 ENCOUNTER — Other Ambulatory Visit: Payer: Self-pay | Admitting: Pharmacist

## 2018-06-25 NOTE — Patient Outreach (Signed)
Donald [name] [name]) Care Management  06/25/2018  Donald [name] Jun 25, 1932 [id number]  85 year old male outreached by [facility] services for a 30 day post discharge medication review.  PMHx includes, but not limited to, OSA w/CPAP, hypertension, atrial fibrillation.   Spoke with [name] on 2/10- HIPPA identifiers verified. Patient refused discharge and medication review attempt and states that he is doing well on his medications and does not have any questions.   Plan:  Will route note to PCP, Dr. Georgina Peer, Sherian Rein D PGY1 Pharmacy Resident  Phone [phone] 06/25/2018   11:02 AM

## 2018-06-26 MED ORDER — FUROSEMIDE 20 MG PO TABS: 20.0000 mg | ORAL_TABLET | Freq: Every day | ORAL | 2 refills | Status: DC

## 2018-06-26 NOTE — Telephone Encounter (Signed)
Pt' medication was sent to pt's pharmacy as requested. Confirmation received.  

## 2018-07-02 ENCOUNTER — Ambulatory Visit: Payer: PPO | Admitting: Internal Medicine

## 2018-07-02 ENCOUNTER — Encounter: Payer: Self-pay | Admitting: Internal Medicine

## 2018-07-02 VITALS — BP 120/76 | HR 64 | Ht 68.0 in | Wt 222.0 lb

## 2018-07-02 DIAGNOSIS — Z950 Presence of cardiac pacemaker: Secondary | ICD-10-CM | POA: Diagnosis not present

## 2018-07-02 DIAGNOSIS — I4891 Unspecified atrial fibrillation: Secondary | ICD-10-CM

## 2018-07-02 DIAGNOSIS — I495 Sick sinus syndrome: Secondary | ICD-10-CM

## 2018-07-02 DIAGNOSIS — I10 Essential (primary) hypertension: Secondary | ICD-10-CM

## 2018-07-02 MED ORDER — DOFETILIDE 500 MCG PO CAPS: 500.0000 ug | ORAL_CAPSULE | Freq: Two times a day (BID) | ORAL | 3 refills | Status: DC

## 2018-07-02 NOTE — Patient Instructions (Signed)
Medication Instructions:  Your physician has recommended you make the following change in your medication:   Take 40mg  of lasix, every other day, for 3 days. On alternating days take your normal 20mg  dose.   Labwork: None ordered.  Testing/Procedures: None ordered.  Follow-Up: Your physician recommends that you schedule a follow-up appointment in:   6 months with Roderic Palau in the A Fib clinic 12 months with Dr Caryl Comes  Any Other Special Instructions Will Be Listed Below (If Applicable).     If you need a refill on your cardiac medications before your next appointment, please call your pharmacy.

## 2018-07-02 NOTE — Progress Notes (Signed)
Patient Care Team: Janith Lima, MD as PCP - General (Internal Medicine) Deboraha Sprang, MD as PCP - Cardiology (Cardiology) Franchot Gallo, MD (Urology) [name], [name], MD (Dermatology) Monna Fam, MD as Consulting Physician (Ophthalmology) Deboraha Sprang, MD as Consulting Physician (Cardiology) Milus Banister, MD as Attending Physician (Gastroenterology)   HPI  Donald [name] is a 85 y.o. 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 (St Jude ).  Anticoagulated with apixaban   DATE TEST EF   10/15    Echo   EF 55 %  mild AS ( mean gradient 10) --? Bicuspid aortic valve  9/16 myoview EF 60-65%   mild ischemia  10/17 Echo EF 60-65%  No comment on bicuspid valve//mild - Mod  AI   11/18 Cath EF 55-65% Mod non obstructive CAD   1/20 Echo 60-65% LAE (52/2.5/45)     Date CR K Mg hgb TSH  10/17 0.97  4.6  13.2   0.56  4/18 1.07 5.2  13.9   1/19 1.26 5.1  14 0.46  1/20 1.04 4.7 2.1 13.6      thromboembolic risk factors ( age  -2, HTN-1, ) for a CHADSVASc Score of 3     Patient failed cardioversion 9/19.  Admitted 1/20 for the initiation of dofetilide. Tolerating dofetilide.  No bleeding  No interval atrial fibrillation.  Occasional brief moments of feeling lightheaded.  No evidence of ventricular high rate episodes on his device.  The patient denies chest pain, nocturnal dyspnea, orthopnea  .  There have been no palpitations, lightheadedness or syncope.  Some edema and mild shortness of breath,  Past Medical History:  Diagnosis Date  . Abnormal CT scan, stomach 02/2014   Thickening of gastric fundus and cardia.  gastritis on EGD 03/2014  . Allergic rhinitis due to pollen   . Arthropathy, unspecified, site unspecified   . Atherosclerosis    a. Noted by abdominal CT 02/2014 (h/o normal nuc 2011).  . Atrial flutter ([place]) 02/24/14   s/p ablation 11/15  . Cardiomyopathy ([place])    tachy  mediated - a. TEE (10/15):  EF 30%;  b. Echo after NSR restored (10/15):  mild LVH, EF 55-60%, mild AS, mild AI, mild MR, mild to mod LAE, mild RAE  . Diverticulosis    severe in descending, sigmoid colon.   . ED (erectile dysfunction)   . Esophageal stricture 03/2014   traversable with endoscope. not dilated.   . Gastric AVM   . GERD (gastroesophageal reflux disease) 03/2014   small HH and gastritis on EGD  . GI bleed   . Habitual alcohol use   . Hemorrhoids   . Hiatal hernia   . Hyperlipidemia   . Hypertension   . Obesity   . OSA on CPAP   . PAF (paroxysmal atrial fibrillation) ([place])   . Prostatitis, unspecified   . 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 Skin cancer    basal and squamous cell    Past Surgical History:  Procedure Laterality Date  . ATRIAL FLUTTER ABLATION N/A 03/19/2014   RFCA of atrial flutter by Dr Caryl Comes  . CARDIOVERSION N/A 02/24/2014   Procedure: CARDIOVERSION;  Surgeon: Lelon Perla, MD;  Location: [place];  Service: Cardiovascular;  Laterality: N/A;  . CARDIOVERSION Right 09/10/2014   Procedure: CARDIOVERSION;  Surgeon: Deboraha Sprang, MD;  Location: [facility] CATH LAB;  Service:  Cardiovascular;  Laterality: Right;  . CARDIOVERSION N/A 01/22/2018   Procedure: CARDIOVERSION;  Surgeon: Fay Records, MD;  Location: [facility] ENDOSCOPY;  Service: Cardiovascular;  Laterality: N/A;  . COLONOSCOPY N/A 04/23/2015   Procedure: COLONOSCOPY;  Surgeon: Ladene Artist, MD;  Location: [facility] ENDOSCOPY;  Service: Endoscopy;  Laterality: N/A;  . ENTEROSCOPY N/A 06/12/2015   Procedure: ENTEROSCOPY;  Surgeon: Ladene Artist, MD;  Location: [facility] ENDOSCOPY;  Service: Endoscopy;  Laterality: N/A;  . EP IMPLANTABLE DEVICE N/A 02/15/2016   Procedure: Pacemaker Implant;  Surgeon: Evans Lance, MD;  Location: [place] CV LAB;  Service: Cardiovascular;  Laterality: N/A;  . ESOPHAGOGASTRODUODENOSCOPY N/A 04/09/2014   Procedure: ESOPHAGOGASTRODUODENOSCOPY (EGD);  Surgeon:  Inda Castle, MD;  Location: [place];  Service: Endoscopy;  Laterality: N/A;  . ESOPHAGOGASTRODUODENOSCOPY N/A 04/23/2015   Procedure: ESOPHAGOGASTRODUODENOSCOPY (EGD);  Surgeon: Ladene Artist, MD;  Location[facility] ENDOSCOPY;  Service: Endoscopy;  Laterality: N/A;  . INGUINAL HERNIA REPAIR     right  . INGUINAL HERNIA REPAIR     left  . LEFT HEART CATH AND CORONARY ANGIOGRAPHY N/A 03/16/2017   Procedure: LEFT HEART CATH AND CORONARY ANGIOGRAPHY;  Surgeon: Burnell Blanks, MD;  Location: [place] CV LAB;  Service: Cardiovascular;  Laterality: N/A;  . ORIF fracture of the elbow    . SKIN CANCER EXCISION  05/21/12   Squamous cell ca  . TEE WITHOUT CARDIOVERSION N/A 02/24/2014   Procedure: TRANSESOPHAGEAL ECHOCARDIOGRAM (TEE);  Surgeon: Lelon Perla, MD;  Location: [facility] ENDOSCOPY;  Service: Cardiovascular;  Laterality: N/A;  . TONSILLECTOMY      Current Outpatient Medications  Medication Sig Dispense Refill  . apixaban (ELIQUIS) 5 MG TABS tablet Take 5 mg by mouth 2 (two) times daily.    Marland Kitchen atorvastatin (LIPITOR) 40 MG tablet TAKE 1 TABLET ONCE DAILY. 90 tablet 0  . diltiazem (CARDIZEM CD) 120 MG 24 hr capsule Take 1 capsule (120 mg total) by mouth 2 (two) times daily. 180 capsule 1  . dofetilide (TIKOSYN) 500 MCG capsule Take 1 capsule (500 mcg total) by mouth 2 (two) times daily. 180 capsule 3  . ferrous sulfate 325 (65 FE) MG tablet Take 1 tablet (325 mg total) by mouth daily with breakfast. 60 tablet 3  . furosemide (LASIX) 20 MG tablet Take 1 tablet (20 mg total) by mouth daily. 90 tablet 2  . isosorbide mononitrate (IMDUR) 30 MG 24 hr tablet TAKE 1 TABLET BY MOUTH DAILY. 90 tablet 3  . Magnesium 65 MG TABS Take 7 tablets by mouth at bedtime.     . metoprolol tartrate (LOPRESSOR) 50 MG tablet Take 25 mg by mouth 2 (two) times daily.    . metroNIDAZOLE (METROGEL) 0.75 % gel Apply 1 application topically daily as needed (for rosacea).     . Multiple Vitamins-Minerals (EYE  VITAMINS) CAPS Take 1 capsule by mouth 2 (two) times daily.    . Multiple Vitamins-Minerals (MULTIVITAMIN PO) Take 1 tablet by mouth 2 (two) times daily.    . nitroGLYCERIN (NITROSTAT) 0.4 MG SL tablet PLACE 1 TABLET UNDER THE TONGUE EVERY 5 MINUTES AS NEEDED FOR CHEST PAIN, MAY REPEAT FOR 3 DOSES. 25 tablet 5  . pantoprazole (PROTONIX) 40 MG tablet Take 1 tablet (40 mg total) by mouth daily. 90 tablet 3   No current facility-administered medications for this visit.     No Known Allergies  Review of Systems negative except from HPI and PMH  Physical Exam BP 120/76   Pulse  64   Ht 5\' 8"  (1.727 m)   Wt 222 lb (100.7 kg)   SpO2 93%   BMI 33.75 kg/m  Well developed and nourished in no acute distress HENT normal Neck supple with JVP-8  +HJR  Carotids brisk and full without bruits Clear Regular rate and rhythm, no murmurs or gallops Abd-soft with active BS without hepatomegaly No Clubbing cyanosis 1-2+ edema Skin-warm and dry A & Oriented  Grossly normal sensory and motor function   ECG sinus @ 64 18/09/43 PVC rare    Assessment and  Plan  Atrial fibrillation/flutter-paroxysmal  CHADS-VASc score 5 (age-89 hypertension-1 cardiomyopathy-1 vascular disease-1)  Aortic Valve Bicuspid previously described but no longer evident  Cardiomyopathy-resolved  Pacemaker-St. Jude  The patient's device was interrogated.  The information was reviewed. No changes were made in the programming.     Hypertension  Exertional chest discomfort  Cath non obstructive disease  Dofetilide   High Risk Medication Surveillance  Lightheadedness very transient    The patient is holding sinus rhythm.  Functional status is significantly improved.  Modest volume overload.  Will increase his diuretic to 40 every other day x3 doses.  Encouraged him to decrease sodium discussed heart failure and sodium intake  BP well controlled    The lightheaded spells may relate to the ectopic beats.  I looked at  them again; I am not entirely sure whether they are PVCs or PACs.  No evidence of ventricular high rate episodes to suggest dofetilide proarrhythmia.  We spent more than 50% of our >25 min visit in face to face counseling regarding the above

## 2018-07-03 DIAGNOSIS — G4733 Obstructive sleep apnea (adult) (pediatric): Secondary | ICD-10-CM | POA: Diagnosis not present

## 2018-07-06 ENCOUNTER — Ambulatory Visit: Payer: PPO | Admitting: Internal Medicine

## 2018-07-06 ENCOUNTER — Telehealth: Payer: Self-pay | Admitting: Internal Medicine

## 2018-07-06 LAB — CUP PACEART INCLINIC DEVICE CHECK
Battery Remaining Longevity: 111 mo
Battery Voltage: 3.01 V
Date Time Interrogation Session: 20200217142316
Implantable Lead Implant Date: 20171002
Implantable Lead Implant Date: 20171002
Implantable Lead Location: 753859
Implantable Lead Location: 753860
Implantable Pulse Generator Implant Date: 20171002
Lead Channel Impedance Value: 412.5 Ohm
Lead Channel Pacing Threshold Amplitude: 0.625 V
Lead Channel Pacing Threshold Amplitude: 0.75 V
Lead Channel Pacing Threshold Amplitude: 0.75 V
Lead Channel Pacing Threshold Pulse Width: 0.5 ms
Lead Channel Pacing Threshold Pulse Width: 0.5 ms
Lead Channel Pacing Threshold Pulse Width: 0.5 ms
Lead Channel Sensing Intrinsic Amplitude: 12 mV
Lead Channel Sensing Intrinsic Amplitude: 3.5 mV
Lead Channel Setting Pacing Amplitude: 1.625
Lead Channel Setting Pacing Amplitude: 2.5 V
Lead Channel Setting Pacing Pulse Width: 0.5 ms
MDC IDC MSMT LEADCHNL RV IMPEDANCE VALUE: 575 Ohm
MDC IDC PG SERIAL: 7951215
MDC IDC SET LEADCHNL RV SENSING SENSITIVITY: 2 mV
MDC IDC STAT BRADY RA PERCENT PACED: 61 %
MDC IDC STAT BRADY RV PERCENT PACED: 5 %
Pulse Gen Model: 2272

## 2018-07-06 NOTE — Telephone Encounter (Signed)
Already handled.Donald [name]

## 2018-07-12 ENCOUNTER — Ambulatory Visit: Payer: PPO | Admitting: Pulmonary Disease

## 2018-07-12 ENCOUNTER — Encounter: Payer: Self-pay | Admitting: Pulmonary Disease

## 2018-07-12 VITALS — BP 116/60 | HR 59 | Ht 68.0 in | Wt 221.4 lb

## 2018-07-12 DIAGNOSIS — J849 Interstitial pulmonary disease, unspecified: Secondary | ICD-10-CM | POA: Diagnosis not present

## 2018-07-12 DIAGNOSIS — Z836 Family history of other diseases of the respiratory system: Secondary | ICD-10-CM

## 2018-07-12 NOTE — Patient Instructions (Signed)
We will check with Dr. Caryl Comes if if it safe to stop Eliquis for the bronchoscope Based on the response we will schedule you for the procedure We will check blood test today for myositis panel Follow-up in 1 month in ILD clinic.

## 2018-07-12 NOTE — Progress Notes (Signed)
Donald [name]    [id number]    10/06/1932  Primary Care Physician:Jones, Arvid Right, MD  Referring Physician: Janith Lima, MD [address], [place] [address]  Chief complaint: Follow-up for pulmonary fibrosis  HPI: 85 year old with recurrent atrial fibrillation, OSA, ex-smoker.  Evaluated in the ILD clinic on 05/24/2022 after high-resolution CT scan that showed pulmonary fibrosis.  At that point he was being considered for amiodarone for atrial fibrillation.  Seen by Dr. Chase Caller and recommended against amiodarone.  He is currently on Tikosyn and follows with Dr. Caryl Comes, EP  He is here for further follow-up and review of labs States that breathing is doing well with no issues  Pets: No pets Occupation: Worked as a Land Exposures: Had a down pillow and comforters which he got rid of last month.  No ongoing exposure.  No mold, hot tub, Jacuzzi, asbestos, humidifier Smoking history: 50-pack-year smoker.  Quit in 2000 Travel history: From [place].  No significant Relevant family history: Brother died of pulmonary fibrosis.  Outpatient Encounter Medications as of 07/12/2018  Medication Sig  . apixaban (ELIQUIS) 5 MG TABS tablet Take 5 mg by mouth 2 (two) times daily.  Marland Kitchen atorvastatin (LIPITOR) 40 MG tablet TAKE 1 TABLET ONCE DAILY.  Marland Kitchen diltiazem (CARDIZEM CD) 120 MG 24 hr capsule Take 1 capsule (120 mg total) by mouth 2 (two) times daily.  Marland Kitchen dofetilide (TIKOSYN) 500 MCG capsule Take 1 capsule (500 mcg total) by mouth 2 (two) times daily.  . ferrous sulfate 325 (65 FE) MG tablet Take 1 tablet (325 mg total) by mouth daily with breakfast.  . furosemide (LASIX) 20 MG tablet Take 1 tablet (20 mg total) by mouth daily.  . isosorbide mononitrate (IMDUR) 30 MG 24 hr tablet TAKE 1 TABLET BY MOUTH DAILY.  . Magnesium 65 MG TABS Take 7 tablets by mouth at bedtime.   . metoprolol tartrate (LOPRESSOR) 50 MG tablet Take 25 mg by mouth 2 (two) times  daily.  . metroNIDAZOLE (METROGEL) 0.75 % gel Apply 1 application topically daily as needed (for rosacea).   . Multiple Vitamins-Minerals (EYE VITAMINS) CAPS Take 1 capsule by mouth 2 (two) times daily.  . Multiple Vitamins-Minerals (MULTIVITAMIN PO) Take 1 tablet by mouth 2 (two) times daily.  . nitroGLYCERIN (NITROSTAT) 0.4 MG SL tablet PLACE 1 TABLET UNDER THE TONGUE EVERY 5 MINUTES AS NEEDED FOR CHEST PAIN, MAY REPEAT FOR 3 DOSES.  Marland Kitchen pantoprazole (PROTONIX) 40 MG tablet Take 1 tablet (40 mg total) by mouth daily.   No facility-administered encounter medications on file as of 07/12/2018.     Allergies as of 07/12/2018  . (No Known Allergies)    Past Medical History:  Diagnosis Date  . Abnormal CT scan, stomach 02/2014   Thickening of gastric fundus and cardia.  gastritis on EGD 03/2014  . Allergic rhinitis due to pollen   . Arthropathy, unspecified, site unspecified   . Atherosclerosis    a. Noted by abdominal CT 02/2014 (h/o normal nuc 2011).  . Atrial flutter ([place]) 02/24/14   s/p ablation 11/15  . Cardiomyopathy ([place])    tachy mediated - a. TEE (10/15):  EF 30%;  b. Echo after NSR restored (10/15):  mild LVH, EF 55-60%, mild AS, mild AI, mild MR, mild to mod LAE, mild RAE  . Diverticulosis    severe in descending, sigmoid colon.   . ED (erectile dysfunction)   . Esophageal stricture 03/2014  traversable with endoscope. not dilated.   . Gastric AVM   . GERD (gastroesophageal reflux disease) 03/2014   small HH and gastritis on EGD  . GI bleed   . Habitual alcohol use   . Hemorrhoids   . Hiatal hernia   . Hyperlipidemia   . Hypertension   . Obesity   . OSA on CPAP   . PAF (paroxysmal atrial fibrillation) ([place])   . Prostatitis, unspecified   . 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 Skin cancer    basal and squamous cell    Past Surgical History:  Procedure Laterality Date  . ATRIAL FLUTTER ABLATION N/A 03/19/2014   RFCA of atrial flutter by Dr Caryl Comes   . CARDIOVERSION N/A 02/24/2014   Procedure: CARDIOVERSION;  Surgeon: Lelon Perla, MD;  Location: [place];  Service: Cardiovascular;  Laterality: N/A;  . CARDIOVERSION Right 09/10/2014   Procedure: CARDIOVERSION;  Surgeon: Deboraha Sprang, MD;  Location: [facility] CATH LAB;  Service: Cardiovascular;  Laterality: Right;  . CARDIOVERSION N/A 01/22/2018   Procedure: CARDIOVERSION;  Surgeon: Fay Records, MD;  Location: [facility] ENDOSCOPY;  Service: Cardiovascular;  Laterality: N/A;  . COLONOSCOPY N/A 04/23/2015   Procedure: COLONOSCOPY;  Surgeon: Ladene Artist, MD;  Location: [facility] ENDOSCOPY;  Service: Endoscopy;  Laterality: N/A;  . ENTEROSCOPY N/A 06/12/2015   Procedure: ENTEROSCOPY;  Surgeon: Ladene Artist, MD;  Location: [facility] ENDOSCOPY;  Service: Endoscopy;  Laterality: N/A;  . EP IMPLANTABLE DEVICE N/A 02/15/2016   Procedure: Pacemaker Implant;  Surgeon: Evans Lance, MD;  Location: [place] CV LAB;  Service: Cardiovascular;  Laterality: N/A;  . ESOPHAGOGASTRODUODENOSCOPY N/A 04/09/2014   Procedure: ESOPHAGOGASTRODUODENOSCOPY (EGD);  Surgeon: Inda Castle, MD;  Location: [place];  Service: Endoscopy;  Laterality: N/A;  . ESOPHAGOGASTRODUODENOSCOPY N/A 04/23/2015   Procedure: ESOPHAGOGASTRODUODENOSCOPY (EGD);  Surgeon: Ladene Artist, MD;  Location: [facility] ENDOSCOPY;  Service: Endoscopy;  Laterality: N/A;  . INGUINAL HERNIA REPAIR     right  . INGUINAL HERNIA REPAIR     left  . LEFT HEART CATH AND CORONARY ANGIOGRAPHY N/A 03/16/2017   Procedure: LEFT HEART CATH AND CORONARY ANGIOGRAPHY;  Surgeon: Burnell Blanks, MD;  Location: [place] CV LAB;  Service: Cardiovascular;  Laterality: N/A;  . ORIF fracture of the elbow    . SKIN CANCER EXCISION  05/21/12   Squamous cell ca  . TEE WITHOUT CARDIOVERSION N/A 02/24/2014   Procedure: TRANSESOPHAGEAL ECHOCARDIOGRAM (TEE);  Surgeon: Lelon Perla, MD;  Location: [facility] ENDOSCOPY;  Service: Cardiovascular;  Laterality: N/A;  .  TONSILLECTOMY      Family History  Problem Relation Age of Onset  . Cancer Mother        ovarian  . Other Father        renal disease- grief over loss of spouse  . Cancer Brother        prostate- died of hematologic disorder 2nd to chemo  . Pulmonary fibrosis Brother   . Diabetes Neg Hx   . Coronary artery disease Neg Hx   . Colon cancer Neg Hx   . Stomach cancer Neg Hx   . Rectal cancer Neg Hx     Social History   Socioeconomic History  . Marital status: Married    Spouse name: Not on file  . Number of children: 3  . Years of education: 64  . Highest education level: Not on file  Occupational History  . Occupation: Land  Employer: RETIRED  Social Needs  . Financial resource strain: Not hard at all  . Food insecurity:    Worry: Never true    Inability: Never true  . Transportation needs:    Medical: No    Non-medical: No  Tobacco Use  . Smoking status: Former Smoker    Packs/day: 0.00    Years: 40.00    Pack years: 0.00    Types: Cigarettes, Cigars    Last attempt to quit: 09/18/2008    Years since quitting: 9.8  . Smokeless tobacco: Former Systems developer    Types: Chew  . Tobacco comment: Has Occasional Cigar/ quit   Substance and Sexual Activity  . Alcohol use: Yes    Alcohol/week: 28.0 standard drinks    Types: 21 Glasses of wine, 7 Shots of liquor per week    Comment: states he does want to drink less and cut back on the amount.   . Drug use: No  . Sexual activity: Not Currently  Lifestyle  . Physical activity:    Days per week: 4 days    Minutes per session: 50 min  . Stress: Not at all  Relationships  . Social connections:    Talks on phone: More than three times a week    Gets together: More than three times a week    Attends religious service: More than 4 times per year    Active member of club or organization: Yes    Attends meetings of clubs or organizations: More than 4 times per year    Relationship status: Married  . Intimate  partner violence:    Fear of current or ex partner: No    Emotionally abused: No    Physically abused: No    Forced sexual activity: No  Other Topics Concern  . 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 married 1961. 2 sons- '64, '63 , I daughter- '66, 4 grandchildren. work: Museum/gallery curator, retired but still consults. 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      No new stressors/    Review of systems: Review of Systems  Constitutional: Negative for fever and chills.  HENT: Negative.   Eyes: Negative for blurred vision.  Respiratory: as per HPI  Cardiovascular: Negative for chest pain and palpitations.  Gastrointestinal: Negative for vomiting, diarrhea, blood per rectum. Genitourinary: Negative for dysuria, urgency, frequency and hematuria.  Musculoskeletal: Negative for myalgias, back pain and joint pain.  Skin: Negative for itching and rash.  Neurological: Negative for dizziness, tremors, focal weakness, seizures and loss of consciousness.  Endo/Heme/Allergies: Negative for environmental allergies.  Psychiatric/Behavioral: Negative for depression, suicidal ideas and hallucinations.  All other systems reviewed and are negative.  Physical Exam: Blood pressure 116/60, pulse (!) 59, height 5\' 8"  (1.727 m), weight 221 lb 6.4 oz (100.4 kg), SpO2 95 %. Gen:      No acute distress HEENT:  EOMI, sclera anicteric Neck:     No masses; no thyromegaly Lungs:    Clear to auscultation bilaterally; normal respiratory effort CV:         Regular rate and rhythm; no murmurs Abd:      + bowel sounds; soft, non-tender; no palpable masses, no distension Ext:    No edema; adequate peripheral perfusion Skin:      Warm and dry; no rash Neuro: alert and oriented x 3 Psych: normal mood and  affect  Data Reviewed: Imaging: CT chest 05/07/2013- mild emphysematous changes, compressive  atelectasis in the right lower lobe.   CT high-resolution 04/23/2018- bronchial wall thickening with mild emphysema.  Patchy areas of peripheral septal thickening, mild groundglass.  Indeterminate for UIP. I have reviewed the images personally.  PFTs: 04/18/2018-FVC 3.43 [96%), FEV1 2.63 [105%], F/F 77, TLC 6.37 [95%], DLCO 17.94 [60%] Moderate diffusion defect.  Labs: Hypersensitivity panel 05/24/2018-negative CTD serologies-ANA negative, CCP less than 16, double-stranded DNA, Ro, La, SCL 70- Rheumatoid factor 14  Aldolase 5.3 CK 3 1 ANCA-negative  Assessment:  Interstitial lung disease, pulmonary fibrosis Unclear etiology.  The fibrosis is indeterminate for UIP on CT scan and appears to be new compared to 2014.  Possibilities include hypersensitivity pneumonitis given his exposure to down pillow and comforter or IPF as he has a family history of pulmonary fibrosis  ILD serologies reviewed with borderline rheumatoid factor which is nonspecific.  We will send myositis panel today  Discussed further work-up including lung biopsy.  He is opposed to surgical lung biopsy under any circumstance. He has agreed to undergo a bronchoscope with BAL and envisia classifier.  If negative for UIP fibrosis in this test then we would monitor for progression of disease and consider empiric anti-fibrotics if there is progression even after mitigating exposure to down feathers  More then 1/2 the time of the 40 min visit was spent in counseling and/or coordination of care with the patient and family.  Plan/Recommendations: - Get clearance from Dr. Caryl Comes for stopping Eliquis - Schedule for bronchoscope with BAL, envisia classifier with Dr. Chase Caller - Myositis panel - Emphasized again to stay away from down exposure  Marshell Garfinkel MD [facility] 07/12/2018, 11:11 AM  CC: Janith Lima, MD

## 2018-07-12 NOTE — Addendum Note (Signed)
Addended by: Suzzanne Cloud E on: 07/12/2018 11:45 AM   Modules accepted: Orders

## 2018-07-16 ENCOUNTER — Other Ambulatory Visit: Payer: Self-pay | Admitting: Internal Medicine

## 2018-07-16 MED ORDER — METOPROLOL TARTRATE 50 MG PO TABS: 25.0000 mg | ORAL_TABLET | Freq: Two times a day (BID) | ORAL | 3 refills | Status: DC

## 2018-07-23 LAB — MYOSITIS PANEL III: RNP: 1.3 EU/ml

## 2018-07-26 NOTE — Telephone Encounter (Signed)
Pt is wondering if he should re-consider to cancel or reschedule his bronch at [facility] for 08/03/2018  follow-up appointment with Dr Chase Caller scheduled for 08/14/2018 Pt is wondering if it should be cancelled due to the covid-19 virus Advised pt that he would need to make this decision; let us know if he would like to cancel or reschedule  Routing message to MR and Raquel Sarna for review.

## 2018-07-27 ENCOUNTER — Encounter (HOSPITAL_COMMUNITY): Payer: PPO

## 2018-07-27 ENCOUNTER — Telehealth: Payer: Self-pay | Admitting: Pulmonary Disease

## 2018-07-27 DIAGNOSIS — J849 Interstitial pulmonary disease, unspecified: Secondary | ICD-10-CM

## 2018-07-27 NOTE — Telephone Encounter (Signed)
Bronch has already been cancelled.  Attempted to call pt but unable to reach. Left message for pt to return call. When pt calls back, please schedule pt an appt in ILD clinic (90min appt) with MR having 78min PFT prior. Thanks!

## 2018-07-27 NOTE — Telephone Encounter (Signed)
   Georjean Mode, CMA      5:13 PM  Note    Pt is wondering if he should re-consider to cancel or reschedule his bronch at [facility] for 08/03/2018  follow-up appointment with Dr Chase Caller scheduled for 08/14/2018 Pt is wondering if it should be cancelled due to the covid-19 virus Advised pt that he would need to make this decision; let us know if he would like to cancel or reschedule  Routing message to MR and Raquel Sarna for review.

## 2018-07-27 NOTE — Telephone Encounter (Signed)
Yes  1 ok to cancel bronch as an effor to maintain social distancing and cut down risk for COVID but please tell him he has to practice extreme #socialdistancing for next 8-12 weeks that basically means stay away and stay home. Be very careful about sick grandkids and kids and no churches, no gatherings. Only do few friends and ensure they have been well for 2 weeks atleast and wash hands, do not touch face  2. Return early may 2020 with spiro/dlc  3. Any issues in interim cal [place]     SIGNATURE    Dr. Brand Males, M.D., F.C.C.P,  Pulmonary and Critical Care Medicine Staff Physician, [place] Director - Interstitial Lung Disease  Program  Pulmonary [place] at [place], [place], [address]  Pager: [phone], If no answer or between  15:00h - 7:00h: call [phone] Telephone: [phone]  4:09 PM 07/27/2018

## 2018-07-27 NOTE — Telephone Encounter (Signed)
Dr Chase Caller- pt wants to reschedule his bronch for the end of April 2020  Please advise when would be good for you, thanks

## 2018-07-27 NOTE — Telephone Encounter (Signed)
I think this is answerd in earlier message. Fu with me end April/early 2020 with spiro/dlco

## 2018-07-30 NOTE — Telephone Encounter (Signed)
Called patient unable to reach LMTCB 

## 2018-07-31 DIAGNOSIS — G4733 Obstructive sleep apnea (adult) (pediatric): Secondary | ICD-10-CM | POA: Diagnosis not present

## 2018-07-31 NOTE — Telephone Encounter (Signed)
ATC Patient.  Left message to call back. 

## 2018-08-02 NOTE — Telephone Encounter (Signed)
ATC Patient.  LMTCB. 

## 2018-08-03 ENCOUNTER — Ambulatory Visit (HOSPITAL_COMMUNITY): Admit: 2018-08-03 | Payer: PPO | Admitting: Internal Medicine

## 2018-08-03 ENCOUNTER — Encounter (HOSPITAL_COMMUNITY): Payer: PPO

## 2018-08-03 ENCOUNTER — Encounter (HOSPITAL_COMMUNITY): Payer: Self-pay

## 2018-08-03 SURGERY — BRONCHOSCOPY, WITH FLUOROSCOPY
Anesthesia: Moderate Sedation | Laterality: Bilateral

## 2018-08-03 NOTE — Telephone Encounter (Signed)
Pt does have a f/u appt scheduled with MR. Nothing further needed.

## 2018-08-06 ENCOUNTER — Other Ambulatory Visit (HOSPITAL_COMMUNITY): Payer: Self-pay | Admitting: Nurse Practitioner

## 2018-08-14 ENCOUNTER — Ambulatory Visit: Payer: PPO | Admitting: Pulmonary Disease

## 2018-08-16 NOTE — Telephone Encounter (Signed)
  PCC's this message was received this afternoon.  Can you assist and advise?   When I was first told I needed a CPAP device  by Dr.Alva I was sent to the local office of [facility] I have used them ever since with no complaints generally although it is sometimes difficult to get an answer from the local office. I have noticed recently on the Monthly Report of Medical and Hospital Claims sent me by [employer] that [facility] is noted as an out-of-network provider. My questions:  1. Is there another provider available who is in-network? 2. Do you recommend that I use this alternate supplier in order to reduce the cost to me and to the insurance co.? As stated above, I am reasonably satisfied with the service I have received from [facility] but wonder if we would be better off with an in-network provider, if one is available.  Message routed to [facility]

## 2018-08-17 ENCOUNTER — Other Ambulatory Visit: Payer: Self-pay | Admitting: Internal Medicine

## 2018-08-17 NOTE — Telephone Encounter (Signed)
Pt can contact his insurance company and ask them for a dme company that is in network with them in this area.  He can then let us know if he wants to change companies.  Will route back to triage so nurse can let patient know this thru [facility].

## 2018-08-21 ENCOUNTER — Ambulatory Visit (INDEPENDENT_AMBULATORY_CARE_PROVIDER_SITE_OTHER): Payer: PPO | Admitting: *Deleted

## 2018-08-21 ENCOUNTER — Other Ambulatory Visit: Payer: Self-pay

## 2018-08-21 DIAGNOSIS — I480 Paroxysmal atrial fibrillation: Secondary | ICD-10-CM

## 2018-08-21 DIAGNOSIS — I495 Sick sinus syndrome: Secondary | ICD-10-CM

## 2018-08-21 DIAGNOSIS — L570 Actinic keratosis: Secondary | ICD-10-CM | POA: Diagnosis not present

## 2018-08-21 LAB — CUP PACEART REMOTE DEVICE CHECK
Battery Remaining Longevity: 118 mo
Battery Remaining Percentage: 95.5 %
Battery Voltage: 3.01 V
Brady Statistic AP VP Percent: 4 %
Brady Statistic AP VS Percent: 95 %
Brady Statistic AS VP Percent: 1 %
Brady Statistic AS VS Percent: 1 %
Brady Statistic RA Percent Paced: 97 %
Brady Statistic RV Percent Paced: 4.3 %
Date Time Interrogation Session: 20200407063334
Implantable Lead Implant Date: 20171002
Implantable Lead Implant Date: 20171002
Implantable Lead Location: 753859
Implantable Lead Location: 753860
Implantable Pulse Generator Implant Date: 20171002
Lead Channel Impedance Value: 430 Ohm
Lead Channel Impedance Value: 580 Ohm
Lead Channel Pacing Threshold Amplitude: 0.5 V
Lead Channel Pacing Threshold Amplitude: 0.75 V
Lead Channel Pacing Threshold Pulse Width: 0.5 ms
Lead Channel Pacing Threshold Pulse Width: 0.5 ms
Lead Channel Sensing Intrinsic Amplitude: 12 mV
Lead Channel Sensing Intrinsic Amplitude: 2.8 mV
Lead Channel Setting Pacing Amplitude: 1.5 V
Lead Channel Setting Pacing Amplitude: 2.5 V
Lead Channel Setting Pacing Pulse Width: 0.5 ms
Lead Channel Setting Sensing Sensitivity: 2 mV
Pulse Gen Model: 2272
Pulse Gen Serial Number: 7951215

## 2018-08-29 NOTE — Progress Notes (Signed)
Remote pacemaker transmission.   

## 2018-08-30 ENCOUNTER — Ambulatory Visit: Payer: PPO | Admitting: Internal Medicine

## 2018-09-03 DIAGNOSIS — L738 Other specified follicular disorders: Secondary | ICD-10-CM | POA: Diagnosis not present

## 2018-09-03 DIAGNOSIS — L570 Actinic keratosis: Secondary | ICD-10-CM | POA: Diagnosis not present

## 2018-09-03 DIAGNOSIS — L821 Other seborrheic keratosis: Secondary | ICD-10-CM | POA: Diagnosis not present

## 2018-09-03 DIAGNOSIS — D225 Melanocytic nevi of trunk: Secondary | ICD-10-CM | POA: Diagnosis not present

## 2018-09-03 DIAGNOSIS — L720 Epidermal cyst: Secondary | ICD-10-CM | POA: Diagnosis not present

## 2018-09-03 DIAGNOSIS — L814 Other melanin hyperpigmentation: Secondary | ICD-10-CM | POA: Diagnosis not present

## 2018-09-03 DIAGNOSIS — Z85828 Personal history of other malignant neoplasm of skin: Secondary | ICD-10-CM | POA: Diagnosis not present

## 2018-09-06 DIAGNOSIS — G4733 Obstructive sleep apnea (adult) (pediatric): Secondary | ICD-10-CM | POA: Diagnosis not present

## 2018-10-02 DIAGNOSIS — G4733 Obstructive sleep apnea (adult) (pediatric): Secondary | ICD-10-CM | POA: Diagnosis not present

## 2018-10-30 DIAGNOSIS — G4733 Obstructive sleep apnea (adult) (pediatric): Secondary | ICD-10-CM | POA: Diagnosis not present

## 2018-11-04 ENCOUNTER — Other Ambulatory Visit (HOSPITAL_COMMUNITY): Payer: Self-pay | Admitting: Nurse Practitioner

## 2018-11-05 ENCOUNTER — Other Ambulatory Visit (HOSPITAL_COMMUNITY): Payer: Self-pay | Admitting: Nurse Practitioner

## 2018-11-13 ENCOUNTER — Other Ambulatory Visit: Payer: Self-pay | Admitting: Internal Medicine

## 2018-11-15 ENCOUNTER — Telehealth: Payer: Self-pay | Admitting: Internal Medicine

## 2018-11-19 NOTE — Telephone Encounter (Signed)
Pt currently scheduled for PFT 7/13 at 9am. Per pt, he is not going to be able to make the 9am PFT appt and needs to have this rescheduled. Patrice, please advise on this for pt. Thanks!

## 2018-11-19 NOTE — Telephone Encounter (Signed)
Called patient had to leave message - Able to move patient to 3pm instead of 9am - advised on pt vm to call back if this appt date and time will not work - 11/19/2018 -pr

## 2018-11-20 ENCOUNTER — Other Ambulatory Visit: Payer: Self-pay

## 2018-11-20 ENCOUNTER — Ambulatory Visit (INDEPENDENT_AMBULATORY_CARE_PROVIDER_SITE_OTHER): Payer: PPO | Admitting: *Deleted

## 2018-11-20 ENCOUNTER — Encounter: Payer: Self-pay | Admitting: Internal Medicine

## 2018-11-20 ENCOUNTER — Encounter (HOSPITAL_COMMUNITY): Payer: Self-pay | Admitting: Nurse Practitioner

## 2018-11-20 ENCOUNTER — Ambulatory Visit (HOSPITAL_COMMUNITY)
Admission: RE | Admit: 2018-11-20 | Discharge: 2018-11-20 | Disposition: A | Discharge status: Home / Self Care | Payer: PPO | Source: Ambulatory Visit | Attending: Nurse Practitioner | Admitting: Nurse Practitioner

## 2018-11-20 VITALS — BP 124/66 | HR 60 | Ht 68.0 in | Wt 222.4 lb

## 2018-11-20 DIAGNOSIS — I700 Atherosclerosis of aorta: Secondary | ICD-10-CM | POA: Insufficient documentation

## 2018-11-20 DIAGNOSIS — Z8042 Family history of malignant neoplasm of prostate: Secondary | ICD-10-CM | POA: Insufficient documentation

## 2018-11-20 DIAGNOSIS — I4891 Unspecified atrial fibrillation: Secondary | ICD-10-CM | POA: Diagnosis present

## 2018-11-20 DIAGNOSIS — I4892 Unspecified atrial flutter: Secondary | ICD-10-CM | POA: Insufficient documentation

## 2018-11-20 DIAGNOSIS — I429 Cardiomyopathy, unspecified: Secondary | ICD-10-CM | POA: Insufficient documentation

## 2018-11-20 DIAGNOSIS — Z6833 Body mass index (BMI) 33.0-33.9, adult: Secondary | ICD-10-CM | POA: Diagnosis not present

## 2018-11-20 DIAGNOSIS — I495 Sick sinus syndrome: Secondary | ICD-10-CM | POA: Diagnosis not present

## 2018-11-20 DIAGNOSIS — I480 Paroxysmal atrial fibrillation: Secondary | ICD-10-CM | POA: Insufficient documentation

## 2018-11-20 DIAGNOSIS — Z85828 Personal history of other malignant neoplasm of skin: Secondary | ICD-10-CM | POA: Diagnosis not present

## 2018-11-20 DIAGNOSIS — E669 Obesity, unspecified: Secondary | ICD-10-CM | POA: Insufficient documentation

## 2018-11-20 DIAGNOSIS — Z8041 Family history of malignant neoplasm of ovary: Secondary | ICD-10-CM | POA: Diagnosis not present

## 2018-11-20 DIAGNOSIS — I10 Essential (primary) hypertension: Secondary | ICD-10-CM | POA: Insufficient documentation

## 2018-11-20 DIAGNOSIS — K219 Gastro-esophageal reflux disease without esophagitis: Secondary | ICD-10-CM | POA: Diagnosis not present

## 2018-11-20 DIAGNOSIS — Z79899 Other long term (current) drug therapy: Secondary | ICD-10-CM | POA: Diagnosis not present

## 2018-11-20 DIAGNOSIS — Z87891 Personal history of nicotine dependence: Secondary | ICD-10-CM | POA: Insufficient documentation

## 2018-11-20 DIAGNOSIS — G4733 Obstructive sleep apnea (adult) (pediatric): Secondary | ICD-10-CM | POA: Insufficient documentation

## 2018-11-20 DIAGNOSIS — E785 Hyperlipidemia, unspecified: Secondary | ICD-10-CM | POA: Insufficient documentation

## 2018-11-20 DIAGNOSIS — Z7901 Long term (current) use of anticoagulants: Secondary | ICD-10-CM | POA: Diagnosis not present

## 2018-11-20 LAB — CUP PACEART REMOTE DEVICE CHECK
Battery Remaining Longevity: 116 mo
Battery Remaining Percentage: 95.5 %
Battery Voltage: 3.01 V
Brady Statistic AP VP Percent: 2.4 %
Brady Statistic AP VS Percent: 96 %
Brady Statistic AS VP Percent: 1 %
Brady Statistic AS VS Percent: 1 %
Brady Statistic RA Percent Paced: 97 %
Brady Statistic RV Percent Paced: 2.6 %
Date Time Interrogation Session: 20200707071914
Implantable Lead Implant Date: 20171002
Implantable Lead Implant Date: 20171002
Implantable Lead Location: 753859
Implantable Lead Location: 753860
Implantable Pulse Generator Implant Date: 20171002
Lead Channel Impedance Value: 390 Ohm
Lead Channel Impedance Value: 530 Ohm
Lead Channel Pacing Threshold Amplitude: 0.625 V
Lead Channel Pacing Threshold Amplitude: 0.75 V
Lead Channel Pacing Threshold Pulse Width: 0.5 ms
Lead Channel Pacing Threshold Pulse Width: 0.5 ms
Lead Channel Sensing Intrinsic Amplitude: 12 mV
Lead Channel Sensing Intrinsic Amplitude: 3 mV
Lead Channel Setting Pacing Amplitude: 1.625
Lead Channel Setting Pacing Amplitude: 2.5 V
Lead Channel Setting Pacing Pulse Width: 0.5 ms
Lead Channel Setting Sensing Sensitivity: 2 mV
Pulse Gen Model: 2272
Pulse Gen Serial Number: 7951215

## 2018-11-20 LAB — BASIC METABOLIC PANEL
Anion gap: 8 (ref 5–15)
BUN: 17 mg/dL (ref 8–23)
CO2: 26 mmol/L (ref 22–32)
Calcium: 9.3 mg/dL (ref 8.9–10.3)
Chloride: 106 mmol/L (ref 98–111)
Creatinine, Ser: 1.15 mg/dL (ref 0.61–1.24)
GFR calc Af Amer: >60 mL/min (ref >60)
GFR calc non Af Amer: 59 mL/min — ABNORMAL LOW (ref >60)
Glucose, Bld: 96 mg/dL (ref 70–99)
Potassium: 4.3 mmol/L (ref 3.5–5.1)
Sodium: 140 mmol/L (ref 135–145)

## 2018-11-20 LAB — MAGNESIUM: Magnesium: 2 mg/dL (ref 1.7–2.4)

## 2018-11-20 NOTE — Patient Instructions (Signed)
Keep legs elevated with sitting, wear knee high socks with extended standing, avoid salty foods May increase lasix to 1 and 1/2 for no more than 3 days for swelling/weight gain.

## 2018-11-20 NOTE — Progress Notes (Signed)
Primary Care Physician: Janith Lima, MD Referring Physician: Dr. Jerrell Belfast Donald [name] is a 85 y.o. male with a h/o atrial flutter,afib, PPM, for tachy/brady syndrome, s/p ablation, OSA on cpap, alcohol use, cardiomyopathy, that was set up for cardioversion by Dr. Caryl Comes, but reported to the endo group that he had missed a dose of Eliquis, so DCCV was cancelled. He is now back in the afib clinic to get set up for cardioversion. He will be eligible for cardioversion the first of the week, not have missing any anticoagulation x 3 weeks. He remains in atrial flutter with RVR.  F/u from unsuccessful cardioversion, 9/9.He reports that his heart rate became regular and in the 60's 2 hours after cardioversion and he has been staying in SR since then. I discussed with Dr. Caryl Comes and instead of starting antiarrythmic, will see if pt continues in SR for now since he has not had frequent breakthrough episodes in the past. He feels improved.  F/u in afib clinic, 11/21. He went back into afib  last night around 5 pm. He continues in afib with RVR at 114 bpm, but in the office on pulse ox he is running around 80-90's at rest. He has reduced his nightly alcohol from 4 drinks a night but is still  drinking 2 drinks nightly. I believe he will  need an antiarrythmic and will confer with Dr. Caryl Comes which he drug he  would like to use. He  is using cpap.  F/u in afib clinic, 11/27. After discussion with Dr. Caryl Comes he feels that pt will be an amiodarone or Tikosyn candidate. He has  mod CAD and structural heart disease so will not use flecainide. After details given on both drugs, he feels that he would prefer amiodarone as he does not want to spend 4 days in the hospital. He does not have any significant lung disease, already is a pt of Tammy Parrett and Dr. Elsworth Soho mostly for treatment of sleep apnea, and I will ask if they need to do any additional testing/concerns with plans for amiodarone use. He does not have thyroid  or lung issues.  F/u in afib clinic, his lung CT showed possible early findings of ILD. Tammy Parrett, did not favor use of amiodarone, if so, would have to have lung testing every 6 months. He is now ready to listen to her advice  and would like to come  in for Tikosyn after the first of the year. He feels he can afford drug.   F/u in afib clinic,05/28/2018, for admission for dofetilide. He is not on any qtc prolonging, no benadryl. No missed eliquis doses. Aware of price of drug.   F/u in afib clinic,1/23, one week after dofetilide admit. He did convert with the drug. He is in SR today. He feels much improved to the point that he has resumed daily walking. He was able to buy dofetilide thru [facility] [employer] at $30 a month.  F/u 11/20/18, he asked to be seen in the afib clinic earlier than his tikosyn visit in August for LLE. Although this am, swelling is minimal. He does have evidence  of VV with many surface spider veins and some ropey  veins evident. He admits to eating salty food, drinking alcohol in the pm's, dangling his feet most of the day. He has knee high compression socks but does not wear them. He has not noted any afib. He had some LLE with Dr. Olin Pia visit in February  and lasix was increased for 3 days.  Today, he denies symptoms of palpitations, chest pain, shortness of breath, orthopnea, PND, lower extremity edema, dizziness, presyncope, syncope, or neurologic sequela. The patient is tolerating medications without difficulties and is otherwise without complaint today.   Past Medical History:  Diagnosis Date   Abnormal CT scan, stomach 02/2014   Thickening of gastric fundus and cardia.  gastritis on EGD 03/2014   Allergic rhinitis due to pollen    Arthropathy, unspecified, site unspecified    Atherosclerosis    a. Noted by abdominal CT 02/2014 (h/o normal nuc 2011).   Atrial flutter ([place]) 02/24/14   s/p ablation 11/15   Cardiomyopathy ([place])    tachy mediated - a.  TEE (10/15):  EF 30%;  b. Echo after NSR restored (10/15):  mild LVH, EF 55-60%, mild AS, mild AI, mild MR, mild to mod LAE, mild RAE   Diverticulosis    severe in descending, sigmoid colon.    ED (erectile dysfunction)    Esophageal stricture 03/2014   traversable with endoscope. not dilated.    Gastric AVM    GERD (gastroesophageal reflux disease) 03/2014   small HH and gastritis on EGD   GI bleed    Habitual alcohol use    Hemorrhoids    Hiatal hernia    Hyperlipidemia    Hypertension    Obesity    OSA on CPAP    PAF (paroxysmal atrial fibrillation) (HCC)    Prostatitis, unspecified    Sinus bradycardia    a. s/p STJ dual chamber PPM   Skin cancer    basal and squamous cell   Past Surgical History:  Procedure Laterality Date   ATRIAL FLUTTER ABLATION N/A 03/19/2014   RFCA of atrial flutter by Dr Caryl Comes   CARDIOVERSION N/A 02/24/2014   Procedure: CARDIOVERSION;  Surgeon: Lelon Perla, MD;  Location: [facility];  Service: Cardiovascular;  Laterality: N/A;   CARDIOVERSION Right 09/10/2014   Procedure: CARDIOVERSION;  Surgeon: Deboraha Sprang, MD;  Location: [facility] CATH LAB;  Service: Cardiovascular;  Laterality: Right;   CARDIOVERSION N/A 01/22/2018   Procedure: CARDIOVERSION;  Surgeon: Fay Records, MD;  Location: [facility] ENDOSCOPY;  Service: Cardiovascular;  Laterality: N/A;   COLONOSCOPY N/A 04/23/2015   Procedure: COLONOSCOPY;  Surgeon: Ladene Artist, MD;  Location: [facility] ENDOSCOPY;  Service: Endoscopy;  Laterality: N/A;   ENTEROSCOPY N/A 06/12/2015   Procedure: ENTEROSCOPY;  Surgeon: Ladene Artist, MD;  Location: [facility] ENDOSCOPY;  Service: Endoscopy;  Laterality: N/A;   EP IMPLANTABLE DEVICE N/A 02/15/2016   Procedure: Pacemaker Implant;  Surgeon: Evans Lance, MD;  Location: [facility] CV LAB;  Service: Cardiovascular;  Laterality: N/A;   ESOPHAGOGASTRODUODENOSCOPY N/A 04/09/2014   Procedure: ESOPHAGOGASTRODUODENOSCOPY (EGD);  Surgeon: Inda Castle,  MD;  Location: [facility];  Service: Endoscopy;  Laterality: N/A;   ESOPHAGOGASTRODUODENOSCOPY N/A 04/23/2015   Procedure: ESOPHAGOGASTRODUODENOSCOPY (EGD);  Surgeon: Ladene Artist, MD;  Location: [facility] ENDOSCOPY;  Service: Endoscopy;  Laterality: N/A;   INGUINAL HERNIA REPAIR     right   INGUINAL HERNIA REPAIR     left   LEFT HEART CATH AND CORONARY ANGIOGRAPHY N/A 03/16/2017   Procedure: LEFT HEART CATH AND CORONARY ANGIOGRAPHY;  Surgeon: Burnell Blanks, MD;  Location: [place] CV LAB;  Service: Cardiovascular;  Laterality: N/A;   ORIF fracture of the elbow     SKIN CANCER EXCISION  05/21/12   Squamous cell ca   TEE WITHOUT CARDIOVERSION N/A 02/24/2014  Procedure: TRANSESOPHAGEAL ECHOCARDIOGRAM (TEE);  Surgeon: Lelon Perla, MD;  Location: [facility] ENDOSCOPY;  Service: Cardiovascular;  Laterality: N/A;   TONSILLECTOMY      Current Outpatient Medications  Medication Sig Dispense Refill   apixaban (ELIQUIS) 5 MG TABS tablet Take 5 mg by mouth 2 (two) times daily.     atorvastatin (LIPITOR) 40 MG tablet TAKE 1 TABLET ONCE DAILY. 90 tablet 0   diltiazem (CARDIZEM CD) 120 MG 24 hr capsule TAKE (1) CAPSULE TWICE DAILY. 180 capsule 0   dofetilide (TIKOSYN) 500 MCG capsule Take 1 capsule (500 mcg total) by mouth 2 (two) times daily. 180 capsule 3   ferrous sulfate 325 (65 FE) MG tablet Take 1 tablet (325 mg total) by mouth daily with breakfast. 60 tablet 3   furosemide (LASIX) 20 MG tablet Take 1 tablet (20 mg total) by mouth daily. 90 tablet 2   isosorbide mononitrate (IMDUR) 30 MG 24 hr tablet TAKE 1 TABLET BY MOUTH DAILY. 90 tablet 3   Magnesium 65 MG TABS Take 7 tablets by mouth at bedtime.      metoprolol tartrate (LOPRESSOR) 50 MG tablet Take 0.5 tablets (25 mg total) by mouth 2 (two) times daily. 90 tablet 3   metroNIDAZOLE (METROGEL) 0.75 % gel Apply 1 application topically daily as needed (for rosacea).      Multiple Vitamins-Minerals (EYE VITAMINS) CAPS  Take 1 capsule by mouth 2 (two) times daily.     Multiple Vitamins-Minerals (MULTIVITAMIN PO) Take 1 tablet by mouth 2 (two) times daily.     nitroGLYCERIN (NITROSTAT) 0.4 MG SL tablet PLACE 1 TABLET UNDER THE TONGUE EVERY 5 MINUTES AS NEEDED FOR CHEST PAIN, MAY REPEAT FOR 3 DOSES. 25 tablet 5   pantoprazole (PROTONIX) 40 MG tablet Take 1 tablet (40 mg total) by mouth daily. 90 tablet 3   No current facility-administered medications for this encounter.     No Known Allergies  Social History   Socioeconomic History   Marital status: Married    Spouse name: Not on file   Number of children: 3   Years of education: 16   Highest education level: Not on file  Occupational History   Occupation: Therapist, sports: RETIRED  Social Designer, fashion/clothing strain: Not hard at all   Food insecurity    Worry: Never true    Inability: Never true   Transportation needs    Medical: No    Non-medical: No  Tobacco Use   Smoking status: Former Smoker    Packs/day: 0.00    Years: 40.00    Pack years: 0.00    Types: Cigarettes, Cigars    Quit date: 09/18/2008    Years since quitting: 10.1   Smokeless tobacco: Former Systems developer    Types: Chew   Tobacco comment: Has Occasional Cigar/ quit   Substance and Sexual Activity   Alcohol use: Yes    Alcohol/week: 28.0 standard drinks    Types: 21 Glasses of wine, 7 Shots of liquor per week    Comment: states he does want to drink less and cut back on the amount.    Drug use: No   Sexual activity: Not Currently  Lifestyle   Physical activity    Days per week: 4 days    Minutes per session: 50 min   Stress: Not at all  Relationships   Social connections    Talks on phone: More than three times a week    Gets together:  More than three times a week    Attends religious service: More than 4 times per year    Active member of club or organization: Yes    Attends meetings of clubs or organizations: More than 4 times  per year    Relationship status: Married   Intimate partner violence    Fear of current or ex partner: No    Emotionally abused: No    Physically abused: No    Forced sexual activity: No  Other Topics Concern   Not on file  Social History Narrative   HSG, [employer]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 married 1961. 2 sons- '64, '63 , I daughter- '66, 4 grandchildren. work: Museum/gallery curator, retired but still consults. 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      No new stressors/     Family History  Problem Relation Age of Onset   Cancer Mother        ovarian   Other Father        renal disease- grief over loss of spouse   Cancer Brother        prostate- died of hematologic disorder 2nd to chemo   Pulmonary fibrosis Brother    Diabetes Neg Hx    Coronary artery disease Neg Hx    Colon cancer Neg Hx    Stomach cancer Neg Hx    Rectal cancer Neg Hx     ROS- All systems are reviewed and negative except as per the HPI above  Physical Exam: Vitals:   11/20/18 1038  BP: 124/66  Pulse: 60  Weight: 100.9 kg  Height: 5\' 8"  (1.727 m)   Wt Readings from Last 3 Encounters:  11/20/18 100.9 kg  07/12/18 100.4 kg  07/02/18 100.7 kg    Labs: Lab Results  Component Value Date   NA 138 06/07/2018   K 4.7 06/07/2018   CL 108 06/07/2018   CO2 23 06/07/2018   GLUCOSE 105 (H) 06/07/2018   BUN 21 06/07/2018   CREATININE 1.04 06/07/2018   CALCIUM 8.8 (L) 06/07/2018   PHOS 2.7 02/14/2016   MG 2.1 06/18/2018   Lab Results  Component Value Date   INR 1.52 06/08/2017   Lab Results  Component Value Date   CHOL 107 06/08/2017   HDL 40.80 06/08/2017   LDLCALC 49 06/08/2017   TRIG 85.0 06/08/2017     GEN- The patient is well appearing, alert and oriented x 3 today.   Head- normocephalic, atraumatic Eyes-  Sclera clear, conjunctiva pink Ears- hearing intact Oropharynx- clear Neck- supple, no  JVP Lymph- no cervical lymphadenopathy Lungs- Clear to ausculation bilaterally, normal work of breathing Heart- irregular rate and rhythm, no murmurs, rubs or gallops, PMI not laterally displaced GI- soft, NT, ND, + BS Extremities- no clubbing, cyanosis, or edema MS- no significant deformity or atrophy Skin- no rash or lesion Psych- euthymic mood, full affect Neuro- strength and sensation are intact  EKG- a paced at 60 bpm, pr int 212 ms, qrs int 92  ms, qtc 450  ms   Chest CT-IMPRESSION: 1. There are subtle changes in the lungs, which could be indicative of interstitial lung disease. At this point, findings are classified as indeterminate for usual interstitial pneumonia (UIP) per current ATS guidelines. 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 If there's persistent clinical concern for interstitial lung disease, repeat high-resolution chest CT is recommended in 12 months  to assess for temporal changes in the appearance of the lung parenchyma. 2. Mild diffuse bronchial wall thickening with mild centrilobular and paraseptal emphysema; imaging findings suggestive of underlying COPD. 3. Aortic atherosclerosis, in addition to left main and 3 vessel coronary artery disease(known CAD with prior LHC). 4. Left atrial enlargement. 5. There are calcifications of the aortic valve. Echocardiographic correlation for evaluation of potential valvular dysfunction may be warranted if clinically indicated.Will need Echo when pt is back in SR to further assess AV. Echo- 1/23- Study Conclusions  - Left ventricle: The cavity size was normal. Wall thickness was   normal. Systolic function was normal. The estimated ejection   fraction was in the range of 60% to 65%. Left ventricular   diastolic function parameters were normal. 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 There was mild regurgitation. - Mitral valve: Calcified  annulus. Mildly thickened leaflets .   There was mild regurgitation. - Left atrium: The atrium was moderately dilated. - Right ventricle: The cavity size was mildly dilated. - Atrial septum: No defect or patent foramen ovale was identified.   Assessment and Plan: 1. Atrial fib/ flutter Remains  in SR with tikosyn Pt had unsuccessful cardioversion but went back in rhythm shortly after cardioversion in September  Pt is now on dofetilide 500 mcgs bid as 01/ 2020 and in SR with much improvement in energy, shortness of breath Pt wanted  to avoid hospitalization and go with amiodarone, however, chest CT, showed possible early ILD so pt was then willing to pursue tikosyn ( brother died from pulmonary fibrosis) Continue eliquis 5 mg bid for CHA2DS2VASc score of 5  Will continue cardizem 120 mg bid and  metoprolol  25 mg bid   Continue to use Cpap to treat  OSA Bmet, mag today   2. PPM Per Dr. Caryl Comes and device clinc  3. HTN  Stable   4. Intermittent LLE Avoid salt and salty foods  Elevate legs when sitting  Reduce alcohol intake Knee high compression socks when on feet more Can increase lasix to 30 mg for 3 days in a row if LLE significant  F/u with Dr. Caryl Comes as scheduled afib clinic in 3 moths   Geroge Baseman. Tamalyn Wadsworth, [facility] [address] [place], [place] [address] [phone]

## 2018-11-22 ENCOUNTER — Ambulatory Visit (INDEPENDENT_AMBULATORY_CARE_PROVIDER_SITE_OTHER): Payer: PPO | Admitting: Internal Medicine

## 2018-11-22 ENCOUNTER — Other Ambulatory Visit (INDEPENDENT_AMBULATORY_CARE_PROVIDER_SITE_OTHER): Payer: PPO

## 2018-11-22 ENCOUNTER — Encounter: Payer: Self-pay | Admitting: Internal Medicine

## 2018-11-22 ENCOUNTER — Other Ambulatory Visit: Payer: Self-pay

## 2018-11-22 VITALS — BP 136/76 | HR 60 | Temp 98.4°F | Resp 16 | Ht 68.0 in | Wt 220.0 lb

## 2018-11-22 DIAGNOSIS — E785 Hyperlipidemia, unspecified: Secondary | ICD-10-CM | POA: Diagnosis not present

## 2018-11-22 DIAGNOSIS — D500 Iron deficiency anemia secondary to blood loss (chronic): Secondary | ICD-10-CM

## 2018-11-22 DIAGNOSIS — I10 Essential (primary) hypertension: Secondary | ICD-10-CM | POA: Diagnosis not present

## 2018-11-22 LAB — HEPATIC FUNCTION PANEL
ALT: 15 U/L (ref 0–53)
AST: 17 U/L (ref 0–37)
Albumin: 4.6 g/dL (ref 3.5–5.2)
Alkaline Phosphatase: 76 U/L (ref 39–117)
Bilirubin, Direct: 0.2 mg/dL (ref 0.0–0.3)
Total Bilirubin: 1 mg/dL (ref 0.2–1.2)
Total Protein: 7.6 g/dL (ref 6.0–8.3)

## 2018-11-22 LAB — LIPID PANEL
Cholesterol: 120 mg/dL (ref 0–200)
HDL: 46.7 mg/dL (ref >39.00)
LDL Cholesterol: 59 mg/dL (ref 0–99)
NonHDL: 73.63
Total CHOL/HDL Ratio: 3
Triglycerides: 72 mg/dL (ref 0.0–149.0)
VLDL: 14.4 mg/dL (ref 0.0–40.0)

## 2018-11-22 LAB — CBC WITH DIFFERENTIAL/PLATELET
Basophils Absolute: 0.1 10*3/uL (ref 0.0–0.1)
Basophils Relative: 1.1 % (ref 0.0–3.0)
Eosinophils Absolute: 0.2 10*3/uL (ref 0.0–0.7)
Eosinophils Relative: 2.3 % (ref 0.0–5.0)
HCT: 41.3 % (ref 39.0–52.0)
Hemoglobin: 14 g/dL (ref 13.0–17.0)
Lymphocytes Relative: 26.8 % (ref 12.0–46.0)
Lymphs Abs: 2.2 10*3/uL (ref 0.7–4.0)
MCHC: 33.8 g/dL (ref 30.0–36.0)
MCV: 99.1 fl (ref 78.0–100.0)
Monocytes Absolute: 0.7 10*3/uL (ref 0.1–1.0)
Monocytes Relative: 9 % (ref 3.0–12.0)
Neutro Abs: 5.1 10*3/uL (ref 1.4–7.7)
Neutrophils Relative %: 60.8 % (ref 43.0–77.0)
Platelets: 184 10*3/uL (ref 150.0–400.0)
RBC: 4.17 Mil/uL — ABNORMAL LOW (ref 4.22–5.81)
RDW: 13.8 % (ref 11.5–15.5)
WBC: 8.3 10*3/uL (ref 4.0–10.5)

## 2018-11-22 LAB — IBC PANEL
Iron: 131 ug/dL (ref 42–165)
Saturation Ratios: 34.4 % (ref 20.0–50.0)
Transferrin: 272 mg/dL (ref 212.0–360.0)

## 2018-11-22 LAB — FERRITIN: Ferritin: 46.9 ng/mL (ref 22.0–322.0)

## 2018-11-22 LAB — TSH: TSH: 0.68 u[IU]/mL (ref 0.35–4.50)

## 2018-11-22 MED ORDER — ATORVASTATIN CALCIUM 40 MG PO TABS: 40.0000 mg | ORAL_TABLET | Freq: Every day | ORAL | 1 refills | Status: DC

## 2018-11-22 NOTE — Progress Notes (Signed)
Subjective:  Patient ID: Donald [name], male    DOB: [birth year]  Age: 85 y.o. MRN: [id number]  CC: Anemia, Hypertension, and Hyperlipidemia   HPI Donald [name] presents for f/up - He is an active gardener and denies any recent episodes of palpitations, DOE, chest pain, diaphoresis, dizziness, lightheadedness, or edema.  He is tolerating the statin well with no muscle aches.  Outpatient Medications Prior to Visit  Medication Sig Dispense Refill  . apixaban (ELIQUIS) 5 MG TABS tablet Take 5 mg by mouth 2 (two) times daily.    Marland Kitchen diltiazem (CARDIZEM CD) 120 MG 24 hr capsule TAKE (1) CAPSULE TWICE DAILY. 180 capsule 0  . dofetilide (TIKOSYN) 500 MCG capsule Take 1 capsule (500 mcg total) by mouth 2 (two) times daily. 180 capsule 3  . ferrous sulfate 325 (65 FE) MG tablet Take 1 tablet (325 mg total) by mouth daily with breakfast. 60 tablet 3  . furosemide (LASIX) 20 MG tablet Take 1 tablet (20 mg total) by mouth daily. 90 tablet 2  . isosorbide mononitrate (IMDUR) 30 MG 24 hr tablet TAKE 1 TABLET BY MOUTH DAILY. 90 tablet 3  . Magnesium 65 MG TABS Take 7 tablets by mouth at bedtime.     . metoprolol tartrate (LOPRESSOR) 50 MG tablet Take 0.5 tablets (25 mg total) by mouth 2 (two) times daily. 90 tablet 3  . metroNIDAZOLE (METROGEL) 0.75 % gel Apply 1 application topically daily as needed (for rosacea).     . Multiple Vitamins-Minerals (EYE VITAMINS) CAPS Take 1 capsule by mouth 2 (two) times daily.    . Multiple Vitamins-Minerals (MULTIVITAMIN PO) Take 1 tablet by mouth 2 (two) times daily.    . nitroGLYCERIN (NITROSTAT) 0.4 MG SL tablet PLACE 1 TABLET UNDER THE TONGUE EVERY 5 MINUTES AS NEEDED FOR CHEST PAIN, MAY REPEAT FOR 3 DOSES. 25 tablet 5  . pantoprazole (PROTONIX) 40 MG tablet Take 1 tablet (40 mg total) by mouth daily. 90 tablet 3  . atorvastatin (LIPITOR) 40 MG tablet TAKE 1 TABLET ONCE DAILY. 90 tablet 0   No facility-administered medications prior to visit.     ROS Review of  Systems  Constitutional: Negative for diaphoresis, fatigue and unexpected weight change.  HENT: Negative.   Eyes: Negative for visual disturbance.  Respiratory: Negative for cough, chest tightness, shortness of breath and wheezing.   Cardiovascular: Negative for chest pain, palpitations and leg swelling.  Gastrointestinal: Negative for abdominal pain, constipation, diarrhea, nausea and vomiting.  Genitourinary: Negative.  Negative for difficulty urinating and dysuria.  Musculoskeletal: Positive for arthralgias. Negative for myalgias.  Skin: Negative.  Negative for color change and pallor.  Neurological: Negative for dizziness, weakness and light-headedness.  Hematological: Negative for adenopathy. Does not bruise/bleed easily.  Psychiatric/Behavioral: Negative.     Objective:  BP 136/76 (BP Location: Left Arm, Patient Position: Sitting, Cuff Size: Large)   Pulse 60   Temp 98.4 F (36.9 C) (Oral)   Resp 16   Ht 5\' 8"  (1.727 m)   Wt 220 lb (99.8 kg)   SpO2 96%   BMI 33.45 kg/m   BP Readings from Last 3 Encounters:  11/22/18 136/76  11/20/18 124/66  07/12/18 116/60    Wt Readings from Last 3 Encounters:  11/22/18 220 lb (99.8 kg)  11/20/18 222 lb 6.4 oz (100.9 kg)  07/12/18 221 lb 6.4 oz (100.4 kg)    Physical Exam Constitutional:      Appearance: He is obese. He is not ill-appearing or diaphoretic.  HENT:     Nose: Nose normal.     Mouth/Throat:     Mouth: Mucous membranes are moist.     Pharynx: No oropharyngeal exudate.  Eyes:     General: No scleral icterus.    Conjunctiva/sclera: Conjunctivae normal.  Neck:     Musculoskeletal: Normal range of motion. No neck rigidity.  Cardiovascular:     Rate and Rhythm: Normal rate and regular rhythm.     Heart sounds: No murmur.  Pulmonary:     Effort: Pulmonary effort is normal.     Breath sounds: No stridor. No wheezing, rhonchi or rales.  Abdominal:     General: Abdomen is protuberant. Bowel sounds are normal. There  is no distension.     Palpations: There is no hepatomegaly, splenomegaly or mass.     Tenderness: There is no abdominal tenderness.     Hernia: No hernia is present.  Musculoskeletal: Normal range of motion.     Right lower leg: No edema.     Left lower leg: No edema.  Lymphadenopathy:     Cervical: No cervical adenopathy.  Skin:    General: Skin is warm and dry.  Neurological:     General: No focal deficit present.     Mental Status: He is alert and oriented to person, place, and time. Mental status is at baseline.  Psychiatric:        Mood and Affect: Mood normal.        Behavior: Behavior normal.     Lab Results  Component Value Date   WBC 8.3 11/22/2018   HGB 14.0 11/22/2018   HCT 41.3 11/22/2018   PLT 184.0 11/22/2018   GLUCOSE 96 11/20/2018   CHOL 120 11/22/2018   TRIG 72.0 11/22/2018   HDL 46.70 11/22/2018   LDLDIRECT 138.8 04/18/2011   LDLCALC 59 11/22/2018   ALT 15 11/22/2018   AST 17 11/22/2018   NA 140 11/20/2018   K 4.3 11/20/2018   CL 106 11/20/2018   CREATININE 1.15 11/20/2018   BUN 17 11/20/2018   CO2 26 11/20/2018   TSH 0.68 11/22/2018   PSA 0.83 11/20/2013   INR 1.52 06/08/2017    No results found.  Assessment & Plan:   [name] was seen today for anemia, hypertension and hyperlipidemia.  Diagnoses and all orders for this visit:  Essential hypertension- His blood pressure is adequately well controlled.  Recent electrolytes and renal function were normal. -     CBC with Differential/Platelet; Future -     TSH; Future -     Cancel: Basic metabolic panel; Future  Hyperlipidemia with target LDL less than 100- He has achieved his LDL goal and is doing well on the statin. -     Lipid panel; Future -     Hepatic function panel; Future -     TSH; Future -     atorvastatin (LIPITOR) 40 MG tablet; Take 1 tablet (40 mg total) by mouth daily.  Iron deficiency anemia due to chronic blood loss- His H&H are normal, his iron level is normal, he reports  no sources of blood loss, will continue the current dose of iron replacement therapy. -     CBC with Differential/Platelet; Future -     IBC panel; Future -     Ferritin; Future   I have changed Donald [name] "Donald [name]"'s atorvastatin. I am also having him maintain his ferrous sulfate, Multiple Vitamins-Minerals (MULTIVITAMIN PO), nitroGLYCERIN, metroNIDAZOLE, pantoprazole, isosorbide mononitrate, Eye Vitamins, Magnesium, apixaban,  furosemide, dofetilide, metoprolol tartrate, and diltiazem.  Meds ordered this encounter  Medications  . atorvastatin (LIPITOR) 40 MG tablet    Sig: Take 1 tablet (40 mg total) by mouth daily.    Dispense:  90 tablet    Refill:  1     Follow-up: No follow-ups on file.  [name], MD

## 2018-11-23 ENCOUNTER — Other Ambulatory Visit: Payer: Self-pay | Admitting: Internal Medicine

## 2018-11-23 ENCOUNTER — Encounter: Payer: Self-pay | Admitting: Internal Medicine

## 2018-11-23 ENCOUNTER — Other Ambulatory Visit (HOSPITAL_COMMUNITY)
Admission: RE | Admit: 2018-11-23 | Discharge: 2018-11-23 | Disposition: A | Discharge status: Home / Self Care | Payer: PPO | Source: Ambulatory Visit | Attending: Internal Medicine | Admitting: Internal Medicine

## 2018-11-23 DIAGNOSIS — Z1159 Encounter for screening for other viral diseases: Secondary | ICD-10-CM | POA: Insufficient documentation

## 2018-11-23 DIAGNOSIS — Z01812 Encounter for preprocedural laboratory examination: Secondary | ICD-10-CM | POA: Insufficient documentation

## 2018-11-23 NOTE — Patient Instructions (Signed)

## 2018-11-24 LAB — SARS CORONAVIRUS 2 (TAT 6-24 HRS): SARS Coronavirus 2: NEGATIVE

## 2018-11-26 ENCOUNTER — Ambulatory Visit (INDEPENDENT_AMBULATORY_CARE_PROVIDER_SITE_OTHER): Payer: PPO | Admitting: Internal Medicine

## 2018-11-26 ENCOUNTER — Other Ambulatory Visit: Payer: Self-pay

## 2018-11-26 ENCOUNTER — Encounter (HOSPITAL_COMMUNITY): Payer: Self-pay

## 2018-11-26 DIAGNOSIS — L720 Epidermal cyst: Secondary | ICD-10-CM | POA: Diagnosis not present

## 2018-11-26 DIAGNOSIS — L718 Other rosacea: Secondary | ICD-10-CM | POA: Diagnosis not present

## 2018-11-26 DIAGNOSIS — J849 Interstitial pulmonary disease, unspecified: Secondary | ICD-10-CM | POA: Diagnosis not present

## 2018-11-26 DIAGNOSIS — D225 Melanocytic nevi of trunk: Secondary | ICD-10-CM | POA: Diagnosis not present

## 2018-11-26 DIAGNOSIS — L821 Other seborrheic keratosis: Secondary | ICD-10-CM | POA: Diagnosis not present

## 2018-11-26 DIAGNOSIS — Z85828 Personal history of other malignant neoplasm of skin: Secondary | ICD-10-CM | POA: Diagnosis not present

## 2018-11-26 DIAGNOSIS — L570 Actinic keratosis: Secondary | ICD-10-CM | POA: Diagnosis not present

## 2018-11-26 DIAGNOSIS — D485 Neoplasm of uncertain behavior of skin: Secondary | ICD-10-CM | POA: Diagnosis not present

## 2018-11-26 DIAGNOSIS — C44519 Basal cell carcinoma of skin of other part of trunk: Secondary | ICD-10-CM | POA: Diagnosis not present

## 2018-11-26 LAB — PULMONARY FUNCTION TEST
DL/VA % pred: 82 %
DL/VA: 3.21 ml/min/mmHg/L
DLCO unc % pred: 76 %
DLCO unc: 17.35 ml/min/mmHg
FEF 25-75 Pre: 1.91 L/sec
FEF2575-%Pred-Pre: 116 %
FEV1-%Pred-Pre: 101 %
FEV1-Pre: 2.53 L
FEV1FVC-%Pred-Pre: 105 %
FEV6-%Pred-Pre: 100 %
FEV6-Pre: 3.32 L
FEV6FVC-%Pred-Pre: 106 %
FVC-%Pred-Pre: 94 %
FVC-Pre: 3.37 L
Pre FEV1/FVC ratio: 75 %
Pre FEV6/FVC Ratio: 98 %

## 2018-11-26 NOTE — Progress Notes (Signed)
Spirometry and Dlco done today. 

## 2018-11-29 DIAGNOSIS — G4733 Obstructive sleep apnea (adult) (pediatric): Secondary | ICD-10-CM | POA: Diagnosis not present

## 2018-12-02 ENCOUNTER — Encounter: Payer: Self-pay | Admitting: Cardiology

## 2018-12-02 NOTE — Progress Notes (Signed)
Remote pacemaker transmission.   

## 2018-12-21 ENCOUNTER — Other Ambulatory Visit (HOSPITAL_COMMUNITY): Payer: Self-pay | Admitting: *Deleted

## 2018-12-21 MED ORDER — DOFETILIDE 500 MCG PO CAPS: 500.0000 ug | ORAL_CAPSULE | Freq: Two times a day (BID) | ORAL | 3 refills | Status: DC

## 2018-12-31 DIAGNOSIS — G4733 Obstructive sleep apnea (adult) (pediatric): Secondary | ICD-10-CM | POA: Diagnosis not present

## 2019-01-01 ENCOUNTER — Ambulatory Visit (HOSPITAL_COMMUNITY): Payer: PPO | Admitting: Nurse Practitioner

## 2019-01-19 ENCOUNTER — Ambulatory Visit (INDEPENDENT_AMBULATORY_CARE_PROVIDER_SITE_OTHER): Payer: PPO

## 2019-01-19 ENCOUNTER — Other Ambulatory Visit: Payer: Self-pay

## 2019-01-19 DIAGNOSIS — Z23 Encounter for immunization: Secondary | ICD-10-CM

## 2019-01-22 DIAGNOSIS — G4733 Obstructive sleep apnea (adult) (pediatric): Secondary | ICD-10-CM | POA: Diagnosis not present

## 2019-02-04 ENCOUNTER — Other Ambulatory Visit (HOSPITAL_COMMUNITY): Payer: Self-pay | Admitting: Nurse Practitioner

## 2019-02-08 ENCOUNTER — Other Ambulatory Visit: Payer: Self-pay | Admitting: Internal Medicine

## 2019-02-08 NOTE — Telephone Encounter (Signed)
Prescription refill request for Eliquis received.  Last office visit: Donald [name] (11-20-2018) Scr: 1.15 (11-20-2018) Age: 85 yo Weight: 99.8 kg  Prescription refill sent.

## 2019-02-19 ENCOUNTER — Ambulatory Visit (INDEPENDENT_AMBULATORY_CARE_PROVIDER_SITE_OTHER): Payer: PPO | Admitting: *Deleted

## 2019-02-19 DIAGNOSIS — I480 Paroxysmal atrial fibrillation: Secondary | ICD-10-CM

## 2019-02-21 DIAGNOSIS — G4733 Obstructive sleep apnea (adult) (pediatric): Secondary | ICD-10-CM | POA: Diagnosis not present

## 2019-02-24 LAB — CUP PACEART REMOTE DEVICE CHECK
Battery Remaining Longevity: 115 mo
Battery Remaining Percentage: 95.5 %
Battery Voltage: 2.99 V
Brady Statistic AP VP Percent: 1.7 %
Brady Statistic AP VS Percent: 97 %
Brady Statistic AS VP Percent: 1 %
Brady Statistic AS VS Percent: 1 %
Brady Statistic RA Percent Paced: 98 %
Brady Statistic RV Percent Paced: 1.8 %
Date Time Interrogation Session: 20201010060203
Implantable Lead Implant Date: 20171002
Implantable Lead Implant Date: 20171002
Implantable Lead Location: 753859
Implantable Lead Location: 753860
Implantable Pulse Generator Implant Date: 20171002
Lead Channel Impedance Value: 390 Ohm
Lead Channel Impedance Value: 490 Ohm
Lead Channel Pacing Threshold Amplitude: 0.625 V
Lead Channel Pacing Threshold Amplitude: 0.75 V
Lead Channel Pacing Threshold Pulse Width: 0.5 ms
Lead Channel Pacing Threshold Pulse Width: 0.5 ms
Lead Channel Sensing Intrinsic Amplitude: 12 mV
Lead Channel Sensing Intrinsic Amplitude: 3.6 mV
Lead Channel Setting Pacing Amplitude: 1.625
Lead Channel Setting Pacing Amplitude: 2.5 V
Lead Channel Setting Pacing Pulse Width: 0.5 ms
Lead Channel Setting Sensing Sensitivity: 2 mV
Pulse Gen Model: 2272
Pulse Gen Serial Number: 7951215

## 2019-02-25 ENCOUNTER — Other Ambulatory Visit: Payer: Self-pay

## 2019-02-25 ENCOUNTER — Encounter (HOSPITAL_COMMUNITY): Payer: Self-pay | Admitting: Nurse Practitioner

## 2019-02-25 ENCOUNTER — Ambulatory Visit (HOSPITAL_COMMUNITY)
Admission: RE | Admit: 2019-02-25 | Discharge: 2019-02-25 | Disposition: A | Discharge status: Home / Self Care | Payer: PPO | Source: Ambulatory Visit | Attending: Nurse Practitioner | Admitting: Nurse Practitioner

## 2019-02-25 VITALS — BP 114/70 | HR 60 | Ht 68.0 in | Wt 221.6 lb

## 2019-02-25 DIAGNOSIS — I4892 Unspecified atrial flutter: Secondary | ICD-10-CM | POA: Insufficient documentation

## 2019-02-25 DIAGNOSIS — I4891 Unspecified atrial fibrillation: Secondary | ICD-10-CM | POA: Insufficient documentation

## 2019-02-25 DIAGNOSIS — K5790 Diverticulosis of intestine, part unspecified, without perforation or abscess without bleeding: Secondary | ICD-10-CM | POA: Insufficient documentation

## 2019-02-25 DIAGNOSIS — I10 Essential (primary) hypertension: Secondary | ICD-10-CM | POA: Diagnosis not present

## 2019-02-25 DIAGNOSIS — I480 Paroxysmal atrial fibrillation: Secondary | ICD-10-CM

## 2019-02-25 DIAGNOSIS — K219 Gastro-esophageal reflux disease without esophagitis: Secondary | ICD-10-CM | POA: Diagnosis not present

## 2019-02-25 DIAGNOSIS — G4733 Obstructive sleep apnea (adult) (pediatric): Secondary | ICD-10-CM | POA: Insufficient documentation

## 2019-02-25 DIAGNOSIS — E785 Hyperlipidemia, unspecified: Secondary | ICD-10-CM | POA: Insufficient documentation

## 2019-02-25 DIAGNOSIS — I429 Cardiomyopathy, unspecified: Secondary | ICD-10-CM | POA: Insufficient documentation

## 2019-02-25 DIAGNOSIS — Z87891 Personal history of nicotine dependence: Secondary | ICD-10-CM | POA: Insufficient documentation

## 2019-02-25 DIAGNOSIS — Z7901 Long term (current) use of anticoagulants: Secondary | ICD-10-CM | POA: Insufficient documentation

## 2019-02-25 DIAGNOSIS — Z79899 Other long term (current) drug therapy: Secondary | ICD-10-CM | POA: Insufficient documentation

## 2019-02-25 LAB — CBC
HCT: 40.6 % (ref 39.0–52.0)
Hemoglobin: 14 g/dL (ref 13.0–17.0)
MCH: 33.7 pg (ref 26.0–34.0)
MCHC: 34.5 g/dL (ref 30.0–36.0)
MCV: 97.8 fL (ref 80.0–100.0)
Platelets: 200 10*3/uL (ref 150–400)
RBC: 4.15 MIL/uL — ABNORMAL LOW (ref 4.22–5.81)
RDW: 12.9 % (ref 11.5–15.5)
WBC: 8.6 10*3/uL (ref 4.0–10.5)
nRBC: 0 % (ref 0.0–0.2)

## 2019-02-25 LAB — BASIC METABOLIC PANEL
Anion gap: 10 (ref 5–15)
BUN: 16 mg/dL (ref 8–23)
CO2: 24 mmol/L (ref 22–32)
Calcium: 9.3 mg/dL (ref 8.9–10.3)
Chloride: 104 mmol/L (ref 98–111)
Creatinine, Ser: 0.97 mg/dL (ref 0.61–1.24)
GFR calc Af Amer: >60 mL/min (ref >60)
GFR calc non Af Amer: >60 mL/min (ref >60)
Glucose, Bld: 94 mg/dL (ref 70–99)
Potassium: 4.8 mmol/L (ref 3.5–5.1)
Sodium: 138 mmol/L (ref 135–145)

## 2019-02-25 LAB — MAGNESIUM: Magnesium: 2 mg/dL (ref 1.7–2.4)

## 2019-02-25 NOTE — Progress Notes (Signed)
Primary Care Physician: Janith Lima, MD Referring Physician: Dr. Jerrell Belfast Donald [name] is a 85 y.o. male with a h/o atrial flutter,afib, PPM, for tachy/brady syndrome, s/p ablation, OSA on cpap, alcohol use, cardiomyopathy, that was set up for cardioversion by Dr. Caryl Comes, but reported to the endo group that he had missed a dose of Eliquis, so DCCV was cancelled. He is now back in the afib clinic to get set up for cardioversion. He will be eligible for cardioversion the first of the week, not have missing any anticoagulation x 3 weeks. He remains in atrial flutter with RVR.  F/u from unsuccessful cardioversion, 9/9.He reports that his heart rate became regular and in the 60's 2 hours after cardioversion and he has been staying in SR since then. I discussed with Dr. Caryl Comes and instead of starting antiarrythmic, will see if pt continues in SR for now since he has not had frequent breakthrough episodes in the past. He feels improved.  F/u in afib clinic, 11/21. He went back into afib  last night around 5 pm. He continues in afib with RVR at 114 bpm, but in the office on pulse ox he is running around 80-90's at rest. He has reduced his nightly alcohol from 4 drinks a night but is still  drinking 2 drinks nightly. I believe he will  need an antiarrythmic and will confer with Dr. Caryl Comes which he drug he  would like to use. He  is using cpap.  F/u in afib clinic, 04/11/18. After discussion with Dr. Caryl Comes he feels that pt will be an amiodarone or Tikosyn candidate. He has  mod CAD and structural heart disease so will not use flecainide. After details given on both drugs, he feels that he would prefer amiodarone as he does not want to spend 4 days in the hospital. He does not have any significant lung disease, already is a pt of Tammy Parrett and Dr. Elsworth Soho mostly for treatment of sleep apnea, and I will ask if they need to do any additional testing/concerns with plans for amiodarone use. He does not have  thyroid or lung issues.  F/u in afib clinic, his lung CT showed possible early findings of ILD. Tammy Parrett, did not favor use of amiodarone, if so, would have to have lung testing every 6 months. He is now ready to listen to her advice  and would like to come  in for Tikosyn after the first of the year. He feels he can afford drug.   F/u in afib clinic,05/28/2018, for admission for dofetilide. He is not on any qtc prolonging, no benadryl. No missed eliquis doses. Aware of price of drug.   F/u in afib clinic,1/23, one week after dofetilide admit. He did convert with the drug. He is in SR today. He feels much improved to the point that he has resumed daily walking. He was able to buy dofetilide thru [facility] [employer] at $30 a month.  F/u 11/20/18, he asked to be seen in the afib clinic earlier than his tikosyn visit in August for LLE. Although this am, swelling is minimal. He does have evidence  of VV with many surface spider veins and some ropey  veins evident. He admits to eating salty food, drinking alcohol in the pm's, dangling his feet most of the day. He has knee high compression socks but does not wear them. He has not noted any afib. He had some LLE with Dr. Olin Pia visit in February  and lasix was increased for 3 days.  F/u in afib clinic, 02/25/19. He reports no afib awareness. Review of recent paceart only revealed one minute of afib since last interrogation.  Today, he denies symptoms of palpitations, chest pain, shortness of breath, orthopnea, PND, lower extremity edema, dizziness, presyncope, syncope, or neurologic sequela. The patient is tolerating medications without difficulties and is otherwise without complaint today.   Past Medical History:  Diagnosis Date  . Abnormal CT scan, stomach 02/2014   Thickening of gastric fundus and cardia.  gastritis on EGD 03/2014  . Allergic rhinitis due to pollen   . Arthropathy, unspecified, site unspecified   . Atherosclerosis    a. Noted  by abdominal CT 02/2014 (h/o normal nuc 2011).  . Atrial flutter ([place]) 02/24/14   s/p ablation 11/15  . Cardiomyopathy ([place])    tachy mediated - a. TEE (10/15):  EF 30%;  b. Echo after NSR restored (10/15):  mild LVH, EF 55-60%, mild AS, mild AI, mild MR, mild to mod LAE, mild RAE  . Diverticulosis    severe in descending, sigmoid colon.   . ED (erectile dysfunction)   . Esophageal stricture 03/2014   traversable with endoscope. not dilated.   . Gastric AVM   . GERD (gastroesophageal reflux disease) 03/2014   small HH and gastritis on EGD  . GI bleed   . Habitual alcohol use   . Hemorrhoids   . Hiatal hernia   . Hyperlipidemia   . Hypertension   . Obesity   . OSA on CPAP   . PAF (paroxysmal atrial fibrillation) ([place])   . Prostatitis, unspecified   . 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 Skin cancer    basal and squamous cell   Past Surgical History:  Procedure Laterality Date  . ATRIAL FLUTTER ABLATION N/A 03/19/2014   RFCA of atrial flutter by Dr Caryl Comes  . CARDIOVERSION N/A 02/24/2014   Procedure: CARDIOVERSION;  Surgeon: Lelon Perla, MD;  Location: [place];  Service: Cardiovascular;  Laterality: N/A;  . CARDIOVERSION Right 09/10/2014   Procedure: CARDIOVERSION;  Surgeon: Deboraha Sprang, MD;  Location: [facility] CATH LAB;  Service: Cardiovascular;  Laterality: Right;  . CARDIOVERSION N/A 01/22/2018   Procedure: CARDIOVERSION;  Surgeon: Fay Records, MD;  Location: [facility] ENDOSCOPY;  Service: Cardiovascular;  Laterality: N/A;  . COLONOSCOPY N/A 04/23/2015   Procedure: COLONOSCOPY;  Surgeon: Ladene Artist, MD;  Location: [facility] ENDOSCOPY;  Service: Endoscopy;  Laterality: N/A;  . ENTEROSCOPY N/A 06/12/2015   Procedure: ENTEROSCOPY;  Surgeon: Ladene Artist, MD;  Location: [facility] ENDOSCOPY;  Service: Endoscopy;  Laterality: N/A;  . EP IMPLANTABLE DEVICE N/A 02/15/2016   Procedure: Pacemaker Implant;  Surgeon: Evans Lance, MD;  Location: [place] CV LAB;  Service:  Cardiovascular;  Laterality: N/A;  . ESOPHAGOGASTRODUODENOSCOPY N/A 04/09/2014   Procedure: ESOPHAGOGASTRODUODENOSCOPY (EGD);  Surgeon: Inda Castle, MD;  Location: [place];  Service: Endoscopy;  Laterality: N/A;  . ESOPHAGOGASTRODUODENOSCOPY N/A 04/23/2015   Procedure: ESOPHAGOGASTRODUODENOSCOPY (EGD);  Surgeon: Ladene Artist, MD;  Location[facility] ENDOSCOPY;  Service: Endoscopy;  Laterality: N/A;  . INGUINAL HERNIA REPAIR     right  . INGUINAL HERNIA REPAIR     left  . LEFT HEART CATH AND CORONARY ANGIOGRAPHY N/A 03/16/2017   Procedure: LEFT HEART CATH AND CORONARY ANGIOGRAPHY;  Surgeon: Burnell Blanks, MD;  Location: [place] CV LAB;  Service: Cardiovascular;  Laterality: N/A;  . ORIF fracture of  the elbow    . SKIN CANCER EXCISION  05/21/12   Squamous cell ca  . TEE WITHOUT CARDIOVERSION N/A 02/24/2014   Procedure: TRANSESOPHAGEAL ECHOCARDIOGRAM (TEE);  Surgeon: Lelon Perla, MD;  Location: [facility] ENDOSCOPY;  Service: Cardiovascular;  Laterality: N/A;  . TONSILLECTOMY      Current Outpatient Medications  Medication Sig Dispense Refill  . atorvastatin (LIPITOR) 40 MG tablet Take 1 tablet (40 mg total) by mouth daily. 90 tablet 1  . diltiazem (CARDIZEM CD) 120 MG 24 hr capsule TAKE (1) CAPSULE TWICE DAILY. 180 capsule 1  . dofetilide (TIKOSYN) 500 MCG capsule Take 1 capsule (500 mcg total) by mouth 2 (two) times daily. 180 capsule 3  . ELIQUIS 5 MG TABS tablet TAKE 1 TABLET BY MOUTH TWICE DAILY. 180 tablet 1  . ferrous sulfate 325 (65 FE) MG tablet Take 1 tablet (325 mg total) by mouth daily with breakfast. 60 tablet 3  . furosemide (LASIX) 20 MG tablet Take 1 tablet (20 mg total) by mouth daily. 90 tablet 2  . isosorbide mononitrate (IMDUR) 30 MG 24 hr tablet TAKE 1 TABLET BY MOUTH DAILY. 90 tablet 3  . Magnesium 65 MG TABS Take 7 tablets by mouth at bedtime.     . metoprolol tartrate (LOPRESSOR) 25 MG tablet     . metroNIDAZOLE (METROGEL) 0.75 % gel Apply 1  application topically daily as needed (for rosacea).     . Multiple Vitamins-Minerals (EYE VITAMINS) CAPS Take 1 capsule by mouth 2 (two) times daily.    . Multiple Vitamins-Minerals (MULTIVITAMIN PO) Take 1 tablet by mouth 2 (two) times daily.    . nitroGLYCERIN (NITROSTAT) 0.4 MG SL tablet PLACE 1 TABLET UNDER THE TONGUE EVERY 5 MINUTES AS NEEDED FOR CHEST PAIN, MAY REPEAT FOR 3 DOSES. 25 tablet 5  . pantoprazole (PROTONIX) 40 MG tablet Take 1 tablet (40 mg total) by mouth daily. 90 tablet 3   No current facility-administered medications for this encounter.     No Known Allergies  Social History   Socioeconomic History  . Marital status: Married    Spouse name: Not on file  . Number of children: 3  . Years of education: 12  . Highest education level: Not on file  Occupational History  . Occupation: Therapist, sports: RETIRED  Social Needs  . Financial resource strain: Not hard at all  . Food insecurity    Worry: Never true    Inability: Never true  . Transportation needs    Medical: No    Non-medical: No  Tobacco Use  . Smoking status: Former Smoker    Packs/day: 0.00    Years: 40.00    Pack years: 0.00    Types: Cigarettes, Cigars    Quit date: 09/18/2008    Years since quitting: 10.4  . Smokeless tobacco: Former Systems developer    Types: Chew  . Tobacco comment: Has Occasional Cigar/ quit   Substance and Sexual Activity  . Alcohol use: Yes    Alcohol/week: 28.0 standard drinks    Types: 21 Glasses of wine, 7 Shots of liquor per week    Comment: states he does want to drink less and cut back on the amount.   . Drug use: No  . Sexual activity: Not Currently  Lifestyle  . Physical activity    Days per week: 4 days    Minutes per session: 50 min  . Stress: Not at all  Relationships  . Social connections  Talks on phone: More than three times a week    Gets together: More than three times a week    Attends religious service: More than 4 times per year     Active member of club or organization: Yes    Attends meetings of clubs or organizations: More than 4 times per year    Relationship status: Married  . Intimate partner violence    Fear of current or ex partner: No    Emotionally abused: No    Physically abused: No    Forced sexual activity: No  Other Topics Concern  . Not on file  Social History Narrative   HSG, [employer]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 married 1961. 2 sons- '64, '63 , I daughter- '66, 4 grandchildren. work: Museum/gallery curator, retired but still consults. 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      No new stressors/     Family History  Problem Relation Age of Onset  . Cancer Mother        ovarian  . Other Father        renal disease- grief over loss of spouse  . Cancer Brother        prostate- died of hematologic disorder 2nd to chemo  . Pulmonary fibrosis Brother   . Diabetes Neg Hx   . Coronary artery disease Neg Hx   . Colon cancer Neg Hx   . Stomach cancer Neg Hx   . Rectal cancer Neg Hx     ROS- All systems are reviewed and negative except as per the HPI above  Physical Exam: There were no vitals filed for this visit. Wt Readings from Last 3 Encounters:  11/22/18 99.8 kg  11/20/18 100.9 kg  07/12/18 100.4 kg    Labs: Lab Results  Component Value Date   NA 140 11/20/2018   K 4.3 11/20/2018   CL 106 11/20/2018   CO2 26 11/20/2018   GLUCOSE 96 11/20/2018   BUN 17 11/20/2018   CREATININE 1.15 11/20/2018   CALCIUM 9.3 11/20/2018   PHOS 2.7 02/14/2016   MG 2.0 11/20/2018   Lab Results  Component Value Date   INR 1.52 06/08/2017   Lab Results  Component Value Date   CHOL 120 11/22/2018   HDL 46.70 11/22/2018   LDLCALC 59 11/22/2018   TRIG 72.0 11/22/2018     GEN- The patient is well appearing, alert and oriented x 3 today.   Head- normocephalic, atraumatic Eyes-  Sclera clear, conjunctiva pink Ears- hearing  intact Oropharynx- clear Neck- supple, no JVP Lymph- no cervical lymphadenopathy Lungs- Clear to ausculation bilaterally, normal work of breathing Heart-regular rate and rhythm, no murmurs, rubs or gallops, PMI not laterally displaced GI- soft, NT, ND, + BS Extremities- no clubbing, cyanosis, or edema MS- no significant deformity or atrophy Skin- no rash or lesion Psych- euthymic mood, full affect Neuro- strength and sensation are intact  EKG- a paced at 60 bpm,  qrs int 92  ms, qtc 460  ms   Chest CT-IMPRESSION: 1. There are subtle changes in the lungs, which could be indicative of interstitial lung disease. At this point, findings are classified as indeterminate for usual interstitial pneumonia (UIP) per current ATS guidelines. 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 If there's persistent clinical concern for interstitial lung disease, repeat high-resolution chest CT is recommended in 12 months to assess for temporal changes in the  appearance of the lung parenchyma. 2. Mild diffuse bronchial wall thickening with mild centrilobular and paraseptal emphysema; imaging findings suggestive of underlying COPD. 3. Aortic atherosclerosis, in addition to left main and 3 vessel coronary artery disease(known CAD with prior LHC). 4. Left atrial enlargement. 5. There are calcifications of the aortic valve. Echocardiographic correlation for evaluation of potential valvular dysfunction may be warranted if clinically indicated.Will need Echo when pt is back in SR to further assess AV. Echo- 05/2018 Study Conclusions  - Left ventricle: The cavity size was normal. Wall thickness was   normal. Systolic function was normal. The estimated ejection   fraction was in the range of 60% to 65%. Left ventricular   diastolic function parameters were normal. 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 There was mild regurgitation. -  Mitral valve: Calcified annulus. Mildly thickened leaflets .   There was mild regurgitation. - Left atrium: The atrium was moderately dilated. - Right ventricle: The cavity size was mildly dilated. - Atrial septum: No defect or patent foramen ovale was identified.   Assessment and Plan: 1. Atrial fib/ flutter Remains  in SR with dofetilide  Continue  dofetilide 500 mcg  bid   Continue eliquis 5 mg bid for CHA2DS2VASc score of 5  Will continue cardizem 120 mg bid and  metoprolol  25 mg bid   Continue to use Cpap to treat  OSA Bmet, mag, cbc today   2. PPM Per Dr. Caryl Comes and device clinc  3. HTN  Stable   4. Intermittent LLE Stable   F/u with Dr. Caryl Comes as scheduled 06/2018 and  afib clinic as needed    Monte Sereno. Dajon Rowe, [facility] [address] [place], [place] [address] [phone]

## 2019-03-01 ENCOUNTER — Encounter: Payer: Self-pay | Admitting: Internal Medicine

## 2019-03-01 DIAGNOSIS — L570 Actinic keratosis: Secondary | ICD-10-CM | POA: Diagnosis not present

## 2019-03-01 DIAGNOSIS — D2262 Melanocytic nevi of left upper limb, including shoulder: Secondary | ICD-10-CM | POA: Diagnosis not present

## 2019-03-01 DIAGNOSIS — L821 Other seborrheic keratosis: Secondary | ICD-10-CM | POA: Diagnosis not present

## 2019-03-01 DIAGNOSIS — Z85828 Personal history of other malignant neoplasm of skin: Secondary | ICD-10-CM | POA: Diagnosis not present

## 2019-03-01 DIAGNOSIS — D225 Melanocytic nevi of trunk: Secondary | ICD-10-CM | POA: Diagnosis not present

## 2019-03-01 NOTE — Progress Notes (Signed)
Remote pacemaker transmission.   

## 2019-03-04 ENCOUNTER — Other Ambulatory Visit: Payer: Self-pay | Admitting: Cardiology

## 2019-03-15 NOTE — Progress Notes (Addendum)
Subjective:   Donald [name] is a 85 y.o. male who presents for Medicare Annual/Subsequent preventive examination.  I connected with patient by a telephone and verified that I am speaking with the correct person using two identifiers. Patient stated full name and DOB. Patient gave permission to continue with telephonic visit. Patient's location was at home and Nurse's location was at [place] office. Participants during this visit included patient and nurse. Review of Systems:   Cardiac Risk Factors include: advanced age (>52men, >60 women);dyslipidemia;male gender Sleep patterns: feels rested on waking, gets up 1-2 times nightly to void and sleeps 8-9 hours nightly. Wears C-PAP  Home Safety/Smoke Alarms: Feels safe in home. Smoke alarms in place.  Living environment; residence and Firearm Safety: apartment. Resides at [facility] independent living. Lives with wife, no needs for DME, good support system Seat Belt Safety/Bike Helmet: Wears seat belt.   PSA-  Lab Results  Component Value Date   PSA 0.83 11/20/2013   PSA 5.23 (H) 04/11/2008   PSA 3.71 09/15/2006       Objective:    Vitals: There were no vitals taken for this visit.  There is no height or weight on file to calculate BMI.  Advanced Directives 03/18/2019 02/27/2018 01/22/2018 06/08/2017 06/08/2017 03/16/2017 02/20/2017  Does Patient Have a Medical Advance Directive? Yes Yes Yes - Yes Yes Yes  Type of Advance Directive [place];Living will [place];Living will [place];Living will Healthcare Power of [place];Living will [place];Living will [place];Living will  Does patient want to make changes to medical advance directive? - - - - - No - Patient declined -  Copy of [place] in Chart? No - copy requested No - copy requested No - copy requested No - copy requested - Yes No - copy  requested  Would patient like information on creating a medical advance directive? - - - - - - -    Tobacco Social History   Tobacco Use  Smoking Status Former Smoker  . Packs/day: 0.00  . Years: 40.00  . Pack years: 0.00  . Types: Cigarettes, Cigars  . Quit date: 09/18/2008  . Years since quitting: 10.5  Smokeless Tobacco Former Systems developer  . Types: Chew  Tobacco Comment   Has Occasional Cigar/ quit      Counseling given: Not Answered Comment: Has Occasional Cigar/ quit   Past Medical History:  Diagnosis Date  . Abnormal CT scan, stomach 02/2014   Thickening of gastric fundus and cardia.  gastritis on EGD 03/2014  . Allergic rhinitis due to pollen   . Arthropathy, unspecified, site unspecified   . Atherosclerosis    a. Noted by abdominal CT 02/2014 (h/o normal nuc 2011).  . Atrial flutter ([place]) 02/24/14   s/p ablation 11/15  . Cardiomyopathy ([place])    tachy mediated - a. TEE (10/15):  EF 30%;  b. Echo after NSR restored (10/15):  mild LVH, EF 55-60%, mild AS, mild AI, mild MR, mild to mod LAE, mild RAE  . Diverticulosis    severe in descending, sigmoid colon.   . ED (erectile dysfunction)   . Esophageal stricture 03/2014   traversable with endoscope. not dilated.   . Gastric AVM   . GERD (gastroesophageal reflux disease) 03/2014   small HH and gastritis on EGD  . GI bleed   . Habitual alcohol use   . Hemorrhoids   . Hiatal hernia   .  Hyperlipidemia   . Hypertension   . Obesity   . OSA on CPAP   . PAF (paroxysmal atrial fibrillation) ([place])   . Prostatitis, unspecified   . 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 Skin cancer    basal and squamous cell   Past Surgical History:  Procedure Laterality Date  . ATRIAL FLUTTER ABLATION N/A 03/19/2014   RFCA of atrial flutter by Dr Caryl Comes  . CARDIOVERSION N/A 02/24/2014   Procedure: CARDIOVERSION;  Surgeon: Lelon Perla, MD;  Location: [place];  Service: Cardiovascular;  Laterality: N/A;  .  CARDIOVERSION Right 09/10/2014   Procedure: CARDIOVERSION;  Surgeon: Deboraha Sprang, MD;  Location: [facility] CATH LAB;  Service: Cardiovascular;  Laterality: Right;  . CARDIOVERSION N/A 01/22/2018   Procedure: CARDIOVERSION;  Surgeon: Fay Records, MD;  Location: [facility] ENDOSCOPY;  Service: Cardiovascular;  Laterality: N/A;  . COLONOSCOPY N/A 04/23/2015   Procedure: COLONOSCOPY;  Surgeon: Ladene Artist, MD;  Location: [facility] ENDOSCOPY;  Service: Endoscopy;  Laterality: N/A;  . ENTEROSCOPY N/A 06/12/2015   Procedure: ENTEROSCOPY;  Surgeon: Ladene Artist, MD;  Location: [facility] ENDOSCOPY;  Service: Endoscopy;  Laterality: N/A;  . EP IMPLANTABLE DEVICE N/A 02/15/2016   Procedure: Pacemaker Implant;  Surgeon: Evans Lance, MD;  Location: [place] CV LAB;  Service: Cardiovascular;  Laterality: N/A;  . ESOPHAGOGASTRODUODENOSCOPY N/A 04/09/2014   Procedure: ESOPHAGOGASTRODUODENOSCOPY (EGD);  Surgeon: Inda Castle, MD;  Location: [place];  Service: Endoscopy;  Laterality: N/A;  . ESOPHAGOGASTRODUODENOSCOPY N/A 04/23/2015   Procedure: ESOPHAGOGASTRODUODENOSCOPY (EGD);  Surgeon: Ladene Artist, MD;  Location[facility] ENDOSCOPY;  Service: Endoscopy;  Laterality: N/A;  . INGUINAL HERNIA REPAIR     right  . INGUINAL HERNIA REPAIR     left  . LEFT HEART CATH AND CORONARY ANGIOGRAPHY N/A 03/16/2017   Procedure: LEFT HEART CATH AND CORONARY ANGIOGRAPHY;  Surgeon: Burnell Blanks, MD;  Location: [place] CV LAB;  Service: Cardiovascular;  Laterality: N/A;  . ORIF fracture of the elbow    . SKIN CANCER EXCISION  05/21/12   Squamous cell ca  . TEE WITHOUT CARDIOVERSION N/A 02/24/2014   Procedure: TRANSESOPHAGEAL ECHOCARDIOGRAM (TEE);  Surgeon: Lelon Perla, MD;  Location: [facility] ENDOSCOPY;  Service: Cardiovascular;  Laterality: N/A;  . TONSILLECTOMY     Family History  Problem Relation Age of Onset  . Cancer Mother        ovarian  . Other Father        renal disease- grief over loss of spouse  . Cancer  Brother        prostate- died of hematologic disorder 2nd to chemo  . Pulmonary fibrosis Brother   . Diabetes Neg Hx   . Coronary artery disease Neg Hx   . Colon cancer Neg Hx   . Stomach cancer Neg Hx   . Rectal cancer Neg Hx    Social History   Socioeconomic History  . Marital status: Married    Spouse name: Not on file  . Number of children: 3  . Years of education: 71  . Highest education level: Not on file  Occupational History  . Occupation: Therapist, sports: RETIRED  Social Needs  . Financial resource strain: Not hard at all  . Food insecurity    Worry: Never true    Inability: Never true  . Transportation needs    Medical: No    Non-medical: No  Tobacco Use  .  Smoking status: Former Smoker    Packs/day: 0.00    Years: 40.00    Pack years: 0.00    Types: Cigarettes, Cigars    Quit date: 09/18/2008    Years since quitting: 10.5  . Smokeless tobacco: Former Systems developer    Types: Chew  . Tobacco comment: Has Occasional Cigar/ quit   Substance and Sexual Activity  . Alcohol use: Yes    Alcohol/week: 28.0 standard drinks    Types: 21 Glasses of Archit Leger, 7 Shots of liquor per week    Comment: states he does want to drink less and cut back on the amount.   . Drug use: No  . Sexual activity: Not Currently  Lifestyle  . Physical activity    Days per week: 4 days    Minutes per session: 50 min  . Stress: Not at all  Relationships  . Social connections    Talks on phone: More than three times a week    Gets together: More than three times a week    Attends religious service: More than 4 times per year    Active member of club or organization: Yes    Attends meetings of clubs or organizations: More than 4 times per year    Relationship status: Married  Other Topics Concern  . 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 married 1961. 2 sons- '64, '63 , I daughter- '66, 4 grandchildren. work: Museum/gallery curator, retired but  still consults. 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      No new stressors/     Outpatient Encounter Medications as of 03/18/2019  Medication Sig  . atorvastatin (LIPITOR) 40 MG tablet Take 1 tablet (40 mg total) by mouth daily.  Marland Kitchen diltiazem (CARDIZEM CD) 120 MG 24 hr capsule TAKE (1) CAPSULE TWICE DAILY.  Marland Kitchen dofetilide (TIKOSYN) 500 MCG capsule Take 1 capsule (500 mcg total) by mouth 2 (two) times daily.  Marland Kitchen ELIQUIS 5 MG TABS tablet TAKE 1 TABLET BY MOUTH TWICE DAILY.  . ferrous sulfate 325 (65 FE) MG tablet Take 1 tablet (325 mg total) by mouth daily with breakfast.  . furosemide (LASIX) 20 MG tablet Take 1 tablet (20 mg total) by mouth daily.  . isosorbide mononitrate (IMDUR) 30 MG 24 hr tablet Take 1 tablet (30 mg total) by mouth daily. Please keep upcoming appt in February with Dr. Caryl Comes for future refills. Thank you  . Magnesium 65 MG TABS Take 7 tablets by mouth at bedtime.   . metoprolol tartrate (LOPRESSOR) 25 MG tablet   . metroNIDAZOLE (METROGEL) 0.75 % gel Apply 1 application topically daily as needed (for rosacea).   . Multiple Vitamins-Minerals (EYE VITAMINS) CAPS Take 1 capsule by mouth 2 (two) times daily.  . Multiple Vitamins-Minerals (MULTIVITAMIN PO) Take 1 tablet by mouth 2 (two) times daily.  . nitroGLYCERIN (NITROSTAT) 0.4 MG SL tablet PLACE 1 TABLET UNDER THE TONGUE EVERY 5 MINUTES AS NEEDED FOR CHEST PAIN, MAY REPEAT FOR 3 DOSES.  Marland Kitchen pantoprazole (PROTONIX) 40 MG tablet Take 1 tablet (40 mg total) by mouth daily.   No facility-administered encounter medications on file as of 03/18/2019.     Activities of Daily Living In your present state of health, do you have any difficulty performing the following activities: 03/18/2019  Hearing? N  Vision? N  Difficulty concentrating or making decisions? N  Walking or climbing stairs?  N  Dressing or bathing? N  Doing errands, shopping? N   Preparing Food and eating ? N  Using the Toilet? N  In the past six months, have you accidently leaked urine? N  Do you have problems with loss of bowel control? N  Managing your Medications? N  Managing your Finances? N  Housekeeping or managing your Housekeeping? N  Some recent data might be hidden    Patient Care Team: Janith Lima, MD as PCP - General (Internal Medicine) Deboraha Sprang, MD as PCP - Cardiology (Cardiology) Franchot Gallo, MD (Urology) [name], [name], MD (Dermatology) Monna Fam, MD as Consulting Physician (Ophthalmology) Deboraha Sprang, MD as Consulting Physician (Cardiology) Pyrtle, Lajuan Lines, MD as Consulting Physician (Gastroenterology) Brand Males, MD as Consulting Physician (Pulmonary Disease)   Assessment:   This is a routine wellness examination for Joon. Physical assessment deferred to PCP.  Exercise Activities and Dietary recommendations Current Exercise Habits: Home exercise routine, Type of exercise: Other - see comments;walking(water aerobic 3 x times per week.), Time (Minutes): 45, Frequency (Times/Week): 5, Weekly Exercise (Minutes/Week): 225, Intensity: Mild, Exercise limited by: orthopedic condition(s)  Diet (meal preparation, eat out, water intake, caffeinated beverages, dairy products, fruits and vegetables): in general, a "healthy" diet  , well balanced   Reviewed heart healthy diet. Encouraged patient to increase daily water and healthy fluid intake.  Goals    . lose weight     Continue to increase the amount of physical activity, eat healthy, enjoy life and family.     . Patient Stated     I want to lose weight by using portion control, watching what I do eat, and cut back on the amount of Arissa Fagin I drink. Continue to stay active and exercise.       Fall Risk Fall Risk  03/18/2019 11/23/2018 02/27/2018 02/20/2017 08/09/2016  Falls in the past year? 0 0 No No No  Number falls in past yr: 0 0 - - -  Injury with Fall? 0 0 - -  -  Follow up - Falls evaluation completed - - -   Is the patient's home free of loose throw rugs in walkways, pet beds, electrical cords, etc?   yes      Grab bars in the bathroom? yes      Handrails on the stairs?   yes      Adequate lighting?   yes  Depression Screen PHQ 2/9 Scores 03/18/2019 11/23/2018 02/27/2018 02/20/2017  PHQ - 2 Score 0 0 0 0  PHQ- 9 Score - - - 0    Cognitive Function MMSE - Mini Mental State Exam 02/27/2018 02/20/2017 09/15/2014  Not completed: - - Unable to complete  Orientation to time 5 5 -  Orientation to Place 5 5 -  Registration 3 3 -  Attention/ Calculation 5 5 -  Recall 2 2 -  Language- name 2 objects 2 2 -  Language- repeat 1 1 -  Language- follow 3 step command 3 3 -  Language- read & follow direction 1 1 -  Write a sentence 1 1 -  Copy design 1 1 -  Total score 29 29 -       Ad8 score reviewed for issues:  Issues making decisions: no  Less interest in hobbies / activities: no  Repeats questions, stories (family complaining): no  Trouble using ordinary gadgets (microwave, computer, phone):no  Forgets the month or year: no  Mismanaging finances: no  Remembering appts: no  Daily  problems with thinking and/or memory: no Ad8 score is= 0  Immunization History  Administered Date(s) Administered  . Fluad Quad(high Dose 65+) 01/19/2019  . H1N1 04/28/2008  . Influenza Split 02/23/2011, 02/07/2012  . Influenza Whole 02/07/2008, 02/04/2009  . Influenza, High Dose Seasonal PF 01/20/2015, 03/07/2016, 02/20/2017, 02/20/2018  . Influenza,inj,Quad PF,6+ Mos 02/07/2013, 02/03/2014  . Pneumococcal Conjugate-13 05/07/2013  . Pneumococcal Polysaccharide-23 03/20/2007, 03/07/2016  . Td 04/11/2008  . Tdap 04/16/2018  . Zoster 01/27/2010  . Zoster Recombinat (Shingrix) 02/27/2018, 04/30/2018   Screening Tests Health Maintenance  Topic Date Due  . TETANUS/TDAP  04/16/2028  . INFLUENZA VACCINE  Completed  . PNA vac Low Risk Adult  Completed        Plan:    Reviewed health maintenance screenings with patient today and relevant education, vaccines, and/or referrals were provided.   I have personally reviewed and noted the following in the patient's chart:   . Medical and social history . Use of alcohol, tobacco or illicit drugs  . Current medications and supplements . Functional ability and status . Nutritional status . Physical activity . Advanced directives . List of other physicians . Screenings to include cognitive, depression, and falls . Referrals and appointments  In addition, I have reviewed and discussed with patient certain preventive protocols, quality metrics, and best practice recommendations. A written personalized care plan for preventive services as well as general preventive health recommendations were provided to patient.     Michiel Cowboy, RN  03/18/2019  Medical screening examination/treatment/procedure(s) were performed by non-physician practitioner and as supervising physician I was immediately available for consultation/collaboration. I agree with above. [name], MD

## 2019-03-18 ENCOUNTER — Ambulatory Visit (INDEPENDENT_AMBULATORY_CARE_PROVIDER_SITE_OTHER): Payer: PPO | Admitting: *Deleted

## 2019-03-18 DIAGNOSIS — Z0000 Encounter for general adult medical examination without abnormal findings: Secondary | ICD-10-CM

## 2019-03-20 ENCOUNTER — Other Ambulatory Visit: Payer: Self-pay | Admitting: Internal Medicine

## 2019-03-31 DIAGNOSIS — J849 Interstitial pulmonary disease, unspecified: Secondary | ICD-10-CM

## 2019-04-01 NOTE — Telephone Encounter (Signed)
Patient sent email today 04/01/19  "On 05/24/18 Dr. Chase Caller diagnosed ILD and suspected IPF. On 07/12/18 Dr. Vaughan Browner indicated a bronchoscopy would be scheduled and it was. [facility] canceled the procedure due to the virus influx - it has not been rescheduled. I had a "breath test" on November 26, 2018 in the [place] office. I have not heard back from the practice since then. Am I to assume that my condition/situation is relatively benign and needs no further treatment at this time or do I need to seek alternate care? I fully realize the impact the  pandemic has had on your practice but I need to know if I should take further action."  Dr. Vaughan Browner or Dr. Chase Caller please advise

## 2019-04-02 NOTE — Telephone Encounter (Signed)
1. Please apologize to the patient Donald [name] profusely on my behalf and practice behalf for his followup not being set up. Yes, the pandemic affected [place]  2. Though the bronch could not be done his PFTs were stable dec 2019- > July 2020 indicating that his next followup should be around now. So, thanks for calling us back  3. REcommendation - defionitely needs followup  - HRCT without contrast next few weeks any location (last in dec 2019)  = spirometry and dlco next few weeks ([facility], [facility], [facility], [facility]) - 30 min slot with me ILD days or any days in next few weeks but after finishing above - depending on findings from pft and repeat ct we decide if we need to visit that biiops    SIGNATURE    Dr. Brand Males, M.D., F.C.C.P,  Pulmonary and Critical Care Medicine Staff Physician, [place] Director - Interstitial Lung Disease  Program  Pulmonary [place] at [place], [place], [address]  Pager: [phone], If no answer or between  15:00h - 7:00h: call [phone] Telephone: [phone]  11:54 AM 04/02/2019

## 2019-04-08 DIAGNOSIS — G4733 Obstructive sleep apnea (adult) (pediatric): Secondary | ICD-10-CM | POA: Diagnosis not present

## 2019-04-19 ENCOUNTER — Other Ambulatory Visit: Payer: Self-pay

## 2019-04-19 ENCOUNTER — Telehealth: Payer: Self-pay | Admitting: Internal Medicine

## 2019-04-19 ENCOUNTER — Ambulatory Visit (INDEPENDENT_AMBULATORY_CARE_PROVIDER_SITE_OTHER)
Admission: RE | Admit: 2019-04-19 | Discharge: 2019-04-19 | Disposition: A | Discharge status: Home / Self Care | Payer: PPO | Source: Ambulatory Visit | Attending: Internal Medicine | Admitting: Internal Medicine

## 2019-04-19 DIAGNOSIS — J8410 Pulmonary fibrosis, unspecified: Secondary | ICD-10-CM | POA: Diagnosis not present

## 2019-04-19 DIAGNOSIS — J432 Centrilobular emphysema: Secondary | ICD-10-CM | POA: Diagnosis not present

## 2019-04-19 DIAGNOSIS — J849 Interstitial pulmonary disease, unspecified: Secondary | ICD-10-CM

## 2019-04-19 NOTE — Telephone Encounter (Signed)
CT shows    - no chagne in ILD x 1 year . Good news  - no evidence of lung cancer   - associated emphysema +   Plan  = per 04/02/2019 phone note I asked for follwup with me. However, I see followup with Dr Elsworth Soho 04/23/2019.  Do not see any documentation of him wanting to switch but is quite possible he wanted to switch because of delayed appt on account of pandemic   - please check if he wants to see me . If not, that is fine and understandable. Elsworth Soho is fine     SIGNATURE    Dr. Brand Males, M.D., F.C.C.P,  Pulmonary and Critical Care Medicine Staff Physician, [place] Director - Interstitial Lung Disease  Program  Pulmonary [place] [facility] [place], [place], [address]  Pager: [phone], If no answer or between  15:00h - 7:00h: call [phone] Telephone: [phone]  2:31 PM 04/19/2019    Ct Chest High Resolution  Result Date: 04/19/2019 CLINICAL DATA:  Follow-up interstitial lung disease. Chronic dyspnea. EXAM: CT CHEST WITHOUT CONTRAST TECHNIQUE: Multidetector CT imaging of the chest was performed following the standard protocol without intravenous contrast. High resolution imaging of the lungs, as well as inspiratory and expiratory imaging, was performed. COMPARISON:  04/23/2018 high-resolution chest CT. FINDINGS: Cardiovascular: Normal heart size. No significant pericardial effusion/thickening. Stable configuration of 2 lead left subclavian pacemaker with lead tips in the right atrium and right ventricular apex. Three-vessel coronary atherosclerosis. Aortic valvular calcifications. Atherosclerotic thoracic aorta with stable ectatic 4.1 cm ascending thoracic aorta. Normal caliber main pulmonary artery. Mediastinum/Nodes: No discrete thyroid nodules. Unremarkable esophagus. No axillary adenopathy. Stable mildly enlarged 1.3 cm right paratracheal node (series 2/image 51), compatible with benign reactive  etiology. No new pathologically enlarged mediastinal nodes. Stable nonenlarged coarsely calcified right hilar nodes from prior granulomatous disease. No discrete noncalcified hilar adenopathy on these noncontrast images. Lungs/Pleura: No pneumothorax. No pleural effusion. Mild centrilobular and paraseptal emphysema with mild diffuse bronchial wall thickening. Stable tiny calcified right middle lobe granulomas. Stable 3 mm solid lingular pulmonary nodule (series 3/image 73), considered benign. No acute consolidative airspace disease, lung masses or new significant pulmonary nodules. There is mild patchy subpleural reticulation and ground-glass attenuation in both lungs, which persists on the prone sequence. Findings have not appreciably changed in the interval. There is a dependent predominance to these findings without clear apicobasilar gradient. No significant regions of traction bronchiectasis, architectural distortion or frank honeycombing. No significant lobular air trapping or evidence of tracheobronchomalacia on the expiration sequence. Upper abdomen: Small hiatal hernia. Scattered simple liver cysts, largest 2.5 cm in the posterior right liver lobe. Simple 3.7 cm renal cyst in the upper right kidney. Musculoskeletal: No aggressive appearing focal osseous lesions. Moderate thoracic spondylosis. IMPRESSION: 1. Spectrum of findings suggestive of interstitial lung disease in the dependent lungs without bronchiectasis or honeycomb, which persists on the prone sequence. No evidence of interval progression since 04/23/2018 high-resolution chest CT study. Nonspecific interstitial pneumonia (NSIP) favored. Usual interstitial pneumonia (UIP) not excluded. Findings are indeterminate for UIP per consensus guidelines: Diagnosis of Idiopathic Pulmonary Fibrosis: An Official ATS/ERS/JRS/ALAT Clinical Practice Guideline. [place], Iss 5, [id number], Jan 14 2017. 2. Stable ectatic 4.1 cm ascending  thoracic aorta. Recommend annual imaging followup by CTA or MRA. This recommendation follows 2010 ACCF/AHA/AATS/ACR/ASA/SCA/SCAI/SIR/STS/SVM Guidelines for the Diagnosis and Management of Patients with Thoracic Aortic  Disease. Circulation. 2010; [id number]. Aortic aneurysm NOS (ICD10-I71.9). 3. Mild centrilobular and paraseptal emphysema with mild diffuse bronchial wall thickening, suggesting COPD. 4. Three-vessel coronary atherosclerosis. 5. Small hiatal hernia. Aortic Atherosclerosis (ICD10-I70.0) and Emphysema (ICD10-J43.9). Electronically Signed   By: Ilona Sorrel M.D.   On: 04/19/2019 12:42

## 2019-04-19 NOTE — Telephone Encounter (Signed)
Attempted to call pt but line went straight to VM. Left message for pt to return call. 

## 2019-04-23 ENCOUNTER — Ambulatory Visit (INDEPENDENT_AMBULATORY_CARE_PROVIDER_SITE_OTHER): Payer: PPO | Admitting: Pulmonary Disease

## 2019-04-23 ENCOUNTER — Other Ambulatory Visit: Payer: Self-pay

## 2019-04-23 ENCOUNTER — Encounter: Payer: Self-pay | Admitting: Pulmonary Disease

## 2019-04-23 DIAGNOSIS — G4733 Obstructive sleep apnea (adult) (pediatric): Secondary | ICD-10-CM

## 2019-04-23 DIAGNOSIS — J849 Interstitial pulmonary disease, unspecified: Secondary | ICD-10-CM | POA: Diagnosis not present

## 2019-04-23 NOTE — Progress Notes (Signed)
Subjective:    Patient ID: BRAILON GOJCAJ, male    DOB: 1932-11-01, 85 y.o.   MRN: [id number]  HPI  85 yo ex-smoker FU of OSA and new diagnosis of ILD.  He used dental appliances without any benefit before getting on CPAP Smoked a pack per day until he quit in 2000, about 40 pack years  PMH -atrial fibrillation status post PPM   He was to be started on amiodarone for atrial fibrillation, underwent evaluation with suggested ILD on HRCT in 04/2018 and again 04/2019 which was read as indeterminate for UIP Older brother died of pulmonary fibrosis At his request we reviewed his CT and PFTs today,  he has seen my partners in ILD clinic for this  He denies any problems with mask or pressure is very compliant with his CPAP machine. CPAP download was reviewed which shows excellent control of events on 13 cm, large leak which seem to resolve within the last month by change of mask interface, no residual AHI and excellent compliance   Significant tests/ events reviewed  Spirometry - FEV1 ratio of 75, with FEV1 of 77% and FVC of 77% suggesting mild restriction CT chest in 04/2013 showed mild emphysema PSG 04/2014 - 206 lbs-Moderate OSA-stopped breathing 24 times an hour.- corrected by C Pap 13 cm, large full facemask   Past Medical History:  Diagnosis Date  . Abnormal CT scan, stomach 02/2014   Thickening of gastric fundus and cardia.  gastritis on EGD 03/2014  . Allergic rhinitis due to pollen   . Arthropathy, unspecified, site unspecified   . Atherosclerosis    a. Noted by abdominal CT 02/2014 (h/o normal nuc 2011).  . Atrial flutter ([place]) 02/24/14   s/p ablation 11/15  . Cardiomyopathy ([place])    tachy mediated - a. TEE (10/15):  EF 30%;  b. Echo after NSR restored (10/15):  mild LVH, EF 55-60%, mild AS, mild AI, mild MR, mild to mod LAE, mild RAE  . Diverticulosis    severe in descending, sigmoid colon.   . ED (erectile dysfunction)   . Esophageal stricture 03/2014   traversable with endoscope. not dilated.   . Gastric AVM   . GERD (gastroesophageal reflux disease) 03/2014   small HH and gastritis on EGD  . GI bleed   . Habitual alcohol use   . Hemorrhoids   . Hiatal hernia   . Hyperlipidemia   . Hypertension   . Obesity   . OSA on CPAP   . PAF (paroxysmal atrial fibrillation) ([place])   . Prostatitis, unspecified   . 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 Skin cancer    basal and squamous cell     Review of Systems neg for any significant sore throat, dysphagia, itching, sneezing, nasal congestion or excess/ purulent secretions, fever, chills, sweats, unintended wt loss, pleuritic or exertional cp, hempoptysis, orthopnea pnd or change in chronic leg swelling. Also denies presyncope, palpitations, heartburn, abdominal pain, nausea, vomiting, diarrhea or change in bowel or urinary habits, dysuria,hematuria, rash, arthralgias, visual complaints, headache, numbness weakness or ataxia.     Objective:   Physical Exam  Gen. Pleasant, obese, in no distress, normal affect ENT - no pallor,icterus, no post nasal drip, class 2-3 airway Neck: No JVD, no thyromegaly, no carotid bruits Lungs: no use of accessory muscles, no dullness to percussion, decreased without rales or rhonchi  Cardiovascular: Rhythm regular, heart sounds  normal, no murmurs or gallops, no peripheral edema Abdomen:  soft and non-tender, no hepatosplenomegaly, BS normal. Musculoskeletal: No deformities, no cyanosis or clubbing Neuro:  alert, non focal, no tremors        Assessment & Plan:

## 2019-04-23 NOTE — Assessment & Plan Note (Signed)
Trial of AirFit F30 full facemask We will check if [employer] is within your network CPAP supplies will be renewed for a year, current machine is set at 13 cm and is working well  Weight loss encouraged, compliance with goal of at least 4-6 hrs every night is the expectation. Advised against medications with sedative side effects Cautioned against driving when sleepy - understanding that sleepiness will vary on a day to day basis

## 2019-04-23 NOTE — Assessment & Plan Note (Addendum)
CT scan from 12/20 reviewed at patient's request-stable from 2019. PFTs reviewed, DLCO is decreased but this may be related to his prior smoking and emphysema Plan was to pursue transbronchial biopsy Shared decision making-discussion around side effects of antifibrotic medications and further diagnostic testing, his preference is to hold off

## 2019-04-23 NOTE — Patient Instructions (Signed)
We discussed your CT scan findings which are stable from a year ago Trial of AirFit F30 full facemask We will check if [employer] is within your network CPAP supplies will be renewed for a year, current machine is set at 13 cm and is working well

## 2019-05-06 DIAGNOSIS — G4733 Obstructive sleep apnea (adult) (pediatric): Secondary | ICD-10-CM | POA: Diagnosis not present

## 2019-05-07 ENCOUNTER — Other Ambulatory Visit: Payer: Self-pay | Admitting: Internal Medicine

## 2019-05-07 DIAGNOSIS — E785 Hyperlipidemia, unspecified: Secondary | ICD-10-CM

## 2019-05-13 DIAGNOSIS — H353132 Nonexudative age-related macular degeneration, bilateral, intermediate dry stage: Secondary | ICD-10-CM | POA: Diagnosis not present

## 2019-05-13 DIAGNOSIS — H35342 Macular cyst, hole, or pseudohole, left eye: Secondary | ICD-10-CM | POA: Diagnosis not present

## 2019-05-13 DIAGNOSIS — H35033 Hypertensive retinopathy, bilateral: Secondary | ICD-10-CM | POA: Diagnosis not present

## 2019-05-13 DIAGNOSIS — H33311 Horseshoe tear of retina without detachment, right eye: Secondary | ICD-10-CM | POA: Diagnosis not present

## 2019-05-14 ENCOUNTER — Encounter: Payer: Self-pay | Admitting: Internal Medicine

## 2019-05-14 DIAGNOSIS — E785 Hyperlipidemia, unspecified: Secondary | ICD-10-CM

## 2019-05-14 MED ORDER — ATORVASTATIN CALCIUM 40 MG PO TABS: 40.0000 mg | ORAL_TABLET | Freq: Every day | ORAL | 1 refills | Status: DC

## 2019-05-14 NOTE — Addendum Note (Signed)
Addended by: Karle Barr on: 05/14/2019 05:01 PM   Modules accepted: Orders

## 2019-05-21 ENCOUNTER — Ambulatory Visit (INDEPENDENT_AMBULATORY_CARE_PROVIDER_SITE_OTHER): Payer: PPO | Admitting: *Deleted

## 2019-05-21 DIAGNOSIS — I480 Paroxysmal atrial fibrillation: Secondary | ICD-10-CM

## 2019-05-21 LAB — CUP PACEART REMOTE DEVICE CHECK
Battery Remaining Longevity: 116 mo
Battery Remaining Percentage: 95.5 %
Battery Voltage: 3.01 V
Brady Statistic AP VP Percent: 1.3 %
Brady Statistic AP VS Percent: 98 %
Brady Statistic AS VP Percent: 1 %
Brady Statistic AS VS Percent: 1 %
Brady Statistic RA Percent Paced: 98 %
Brady Statistic RV Percent Paced: 1.4 %
Date Time Interrogation Session: 20210105034256
Implantable Lead Implant Date: 20171002
Implantable Lead Implant Date: 20171002
Implantable Lead Location: 753859
Implantable Lead Location: 753860
Implantable Pulse Generator Implant Date: 20171002
Lead Channel Impedance Value: 390 Ohm
Lead Channel Impedance Value: 530 Ohm
Lead Channel Pacing Threshold Amplitude: 0.625 V
Lead Channel Pacing Threshold Amplitude: 0.75 V
Lead Channel Pacing Threshold Pulse Width: 0.5 ms
Lead Channel Pacing Threshold Pulse Width: 0.5 ms
Lead Channel Sensing Intrinsic Amplitude: 12 mV
Lead Channel Sensing Intrinsic Amplitude: 3.6 mV
Lead Channel Setting Pacing Amplitude: 1.625
Lead Channel Setting Pacing Amplitude: 2.5 V
Lead Channel Setting Pacing Pulse Width: 0.5 ms
Lead Channel Setting Sensing Sensitivity: 2 mV
Pulse Gen Model: 2272
Pulse Gen Serial Number: 7951215

## 2019-05-28 ENCOUNTER — Other Ambulatory Visit: Payer: Self-pay | Admitting: Cardiology

## 2019-05-28 DIAGNOSIS — C44319 Basal cell carcinoma of skin of other parts of face: Secondary | ICD-10-CM | POA: Diagnosis not present

## 2019-05-28 DIAGNOSIS — D225 Melanocytic nevi of trunk: Secondary | ICD-10-CM | POA: Diagnosis not present

## 2019-05-28 DIAGNOSIS — L570 Actinic keratosis: Secondary | ICD-10-CM | POA: Diagnosis not present

## 2019-05-28 DIAGNOSIS — D2271 Melanocytic nevi of right lower limb, including hip: Secondary | ICD-10-CM | POA: Diagnosis not present

## 2019-05-28 DIAGNOSIS — C44519 Basal cell carcinoma of skin of other part of trunk: Secondary | ICD-10-CM | POA: Diagnosis not present

## 2019-05-28 DIAGNOSIS — C4441 Basal cell carcinoma of skin of scalp and neck: Secondary | ICD-10-CM | POA: Diagnosis not present

## 2019-05-28 DIAGNOSIS — Z85828 Personal history of other malignant neoplasm of skin: Secondary | ICD-10-CM | POA: Diagnosis not present

## 2019-05-28 DIAGNOSIS — C44619 Basal cell carcinoma of skin of left upper limb, including shoulder: Secondary | ICD-10-CM | POA: Diagnosis not present

## 2019-05-28 DIAGNOSIS — D485 Neoplasm of uncertain behavior of skin: Secondary | ICD-10-CM | POA: Diagnosis not present

## 2019-05-28 DIAGNOSIS — L738 Other specified follicular disorders: Secondary | ICD-10-CM | POA: Diagnosis not present

## 2019-05-28 DIAGNOSIS — L821 Other seborrheic keratosis: Secondary | ICD-10-CM | POA: Diagnosis not present

## 2019-06-05 DIAGNOSIS — G4733 Obstructive sleep apnea (adult) (pediatric): Secondary | ICD-10-CM | POA: Diagnosis not present

## 2019-06-18 DIAGNOSIS — C4441 Basal cell carcinoma of skin of scalp and neck: Secondary | ICD-10-CM | POA: Diagnosis not present

## 2019-06-18 DIAGNOSIS — C44319 Basal cell carcinoma of skin of other parts of face: Secondary | ICD-10-CM | POA: Diagnosis not present

## 2019-06-18 DIAGNOSIS — Z85828 Personal history of other malignant neoplasm of skin: Secondary | ICD-10-CM | POA: Diagnosis not present

## 2019-07-05 DIAGNOSIS — Z950 Presence of cardiac pacemaker: Secondary | ICD-10-CM | POA: Insufficient documentation

## 2019-07-08 DIAGNOSIS — G4733 Obstructive sleep apnea (adult) (pediatric): Secondary | ICD-10-CM | POA: Diagnosis not present

## 2019-07-09 ENCOUNTER — Other Ambulatory Visit: Payer: Self-pay

## 2019-07-09 ENCOUNTER — Ambulatory Visit: Payer: PPO | Admitting: Internal Medicine

## 2019-07-09 ENCOUNTER — Encounter: Payer: Self-pay | Admitting: Internal Medicine

## 2019-07-09 VITALS — BP 118/60 | HR 63 | Ht 68.0 in | Wt 225.2 lb

## 2019-07-09 DIAGNOSIS — Z950 Presence of cardiac pacemaker: Secondary | ICD-10-CM

## 2019-07-09 DIAGNOSIS — I480 Paroxysmal atrial fibrillation: Secondary | ICD-10-CM

## 2019-07-09 NOTE — Progress Notes (Signed)
Patient Care Team: Janith Lima, MD as PCP - General (Internal Medicine) Deboraha Sprang, MD as PCP - Cardiology (Cardiology) Franchot Gallo, MD (Urology) [name], [name], MD (Dermatology) Monna Fam, MD as Consulting Physician (Ophthalmology) Deboraha Sprang, MD as Consulting Physician (Cardiology) Pyrtle, Lajuan Lines, MD as Consulting Physician (Gastroenterology) Brand Males, MD as Consulting Physician (Pulmonary Disease)   HPI  Donald [name] is a 86 y.o. 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 (St Jude ).  Anticoagulated with apixaban   DATE TEST EF   10/15    Echo   EF 55 %  mild AS ( mean gradient 10) --? Bicuspid aortic valve  9/16 myoview EF 60-65%   mild ischemia  10/17 Echo EF 60-65%  No comment on bicuspid valve//mild - Mod  AI   11/18 Cath EF 55-65% Mod non obstructive CAD   1/20 Echo 60-65% LAE (52/2.5/45)     Date CR K Mg Hgb TSH  10/17 0.97  4.6  13.2   0.56  4/18 1.07 5.2  13.9   1/19 1.26 5.1  14 0.46  1/20 1.04 4.7 2.1 13.6   10/20 0.97 4.8 2.0 14.0 [id number]     thromboembolic risk factors ( age  -2, HTN-1, ) for a CHADSVASc Score of 3    Patient failed cardioversion 9/19.  Admitted 1/20 for the initiation of dofetilide. Tolerating dofetilide.  No bleeding  The patient denies chest pain, shortness of breath, nocturnal dyspnea, orthopnea or peripheral edema.  There have been no palpitations, lightheadedness or syncope.    Past Medical History:  Diagnosis Date  . Abnormal CT scan, stomach 02/2014   Thickening of gastric fundus and cardia.  gastritis on EGD 03/2014  . Allergic rhinitis due to pollen   . Arthropathy, unspecified, site unspecified   . Atherosclerosis    a. Noted by abdominal CT 02/2014 (h/o normal nuc 2011).  . Atrial flutter ([place]) 02/24/14   s/p ablation 11/15  . Cardiomyopathy ([place])    tachy mediated - a. TEE (10/15):  EF 30%;  b. Echo after NSR  restored (10/15):  mild LVH, EF 55-60%, mild AS, mild AI, mild MR, mild to mod LAE, mild RAE  . Diverticulosis    severe in descending, sigmoid colon.   . ED (erectile dysfunction)   . Esophageal stricture 03/2014   traversable with endoscope. not dilated.   . Gastric AVM   . GERD (gastroesophageal reflux disease) 03/2014   small HH and gastritis on EGD  . GI bleed   . Habitual alcohol use   . Hemorrhoids   . Hiatal hernia   . Hyperlipidemia   . Hypertension   . Obesity   . OSA on CPAP   . PAF (paroxysmal atrial fibrillation) ([place])   . Prostatitis, unspecified   . 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 Skin cancer    basal and squamous cell    Past Surgical History:  Procedure Laterality Date  . ATRIAL FLUTTER ABLATION N/A 03/19/2014   RFCA of atrial flutter by Dr Caryl Comes  . CARDIOVERSION N/A 02/24/2014   Procedure: CARDIOVERSION;  Surgeon: Lelon Perla, MD;  Location: [facility];  Service: Cardiovascular;  Laterality: N/A;  . CARDIOVERSION Right 09/10/2014   Procedure: CARDIOVERSION;  Surgeon: Deboraha Sprang, MD;  Location: [facility] CATH LAB;  Service: Cardiovascular;  Laterality: Right;  . CARDIOVERSION N/A 01/22/2018  Procedure: CARDIOVERSION;  Surgeon: Fay Records, MD;  Location: [facility] ENDOSCOPY;  Service: Cardiovascular;  Laterality: N/A;  . COLONOSCOPY N/A 04/23/2015   Procedure: COLONOSCOPY;  Surgeon: Ladene Artist, MD;  Location: [facility] ENDOSCOPY;  Service: Endoscopy;  Laterality: N/A;  . ENTEROSCOPY N/A 06/12/2015   Procedure: ENTEROSCOPY;  Surgeon: Ladene Artist, MD;  Location: [facility] ENDOSCOPY;  Service: Endoscopy;  Laterality: N/A;  . EP IMPLANTABLE DEVICE N/A 02/15/2016   Procedure: Pacemaker Implant;  Surgeon: Evans Lance, MD;  Location: [facility] CV LAB;  Service: Cardiovascular;  Laterality: N/A;  . ESOPHAGOGASTRODUODENOSCOPY N/A 04/09/2014   Procedure: ESOPHAGOGASTRODUODENOSCOPY (EGD);  Surgeon: Inda Castle, MD;  Location: [facility];  Service:  Endoscopy;  Laterality: N/A;  . ESOPHAGOGASTRODUODENOSCOPY N/A 04/23/2015   Procedure: ESOPHAGOGASTRODUODENOSCOPY (EGD);  Surgeon: Ladene Artist, MD;  Location: [facility] ENDOSCOPY;  Service: Endoscopy;  Laterality: N/A;  . INGUINAL HERNIA REPAIR     right  . INGUINAL HERNIA REPAIR     left  . LEFT HEART CATH AND CORONARY ANGIOGRAPHY N/A 03/16/2017   Procedure: LEFT HEART CATH AND CORONARY ANGIOGRAPHY;  Surgeon: Burnell Blanks, MD;  Location: [facility] CV LAB;  Service: Cardiovascular;  Laterality: N/A;  . ORIF fracture of the elbow    . SKIN CANCER EXCISION  05/21/12   Squamous cell ca  . TEE WITHOUT CARDIOVERSION N/A 02/24/2014   Procedure: TRANSESOPHAGEAL ECHOCARDIOGRAM (TEE);  Surgeon: Lelon Perla, MD;  Location: [facility] ENDOSCOPY;  Service: Cardiovascular;  Laterality: N/A;  . TONSILLECTOMY      Current Outpatient Medications  Medication Sig Dispense Refill  . atorvastatin (LIPITOR) 40 MG tablet Take 1 tablet (40 mg total) by mouth daily. 90 tablet 1  . diltiazem (CARDIZEM CD) 120 MG 24 hr capsule TAKE (1) CAPSULE TWICE DAILY. 180 capsule 1  . dofetilide (TIKOSYN) 500 MCG capsule Take 1 capsule (500 mcg total) by mouth 2 (two) times daily. 180 capsule 3  . ELIQUIS 5 MG TABS tablet TAKE 1 TABLET BY MOUTH TWICE DAILY. 180 tablet 1  . ferrous sulfate 325 (65 FE) MG tablet Take 1 tablet (325 mg total) by mouth daily with breakfast. 60 tablet 3  . furosemide (LASIX) 20 MG tablet Take 1 tablet (20 mg total) by mouth daily. 90 tablet 1  . isosorbide mononitrate (IMDUR) 30 MG 24 hr tablet Take 1 tablet (30 mg total) by mouth daily. Please keep upcoming appt in February with Dr. Caryl Comes for future refills. Thank you 90 tablet 0  . Magnesium 65 MG TABS Take 7 tablets by mouth at bedtime.     . metoprolol tartrate (LOPRESSOR) 25 MG tablet     . metroNIDAZOLE (METROGEL) 0.75 % gel Apply 1 application topically daily as needed (for rosacea).     . Multiple Vitamins-Minerals (EYE VITAMINS) CAPS  Take 1 capsule by mouth 2 (two) times daily.    . Multiple Vitamins-Minerals (MULTIVITAMIN PO) Take 1 tablet by mouth 2 (two) times daily.    . nitroGLYCERIN (NITROSTAT) 0.4 MG SL tablet PLACE 1 TABLET UNDER THE TONGUE EVERY 5 MINUTES AS NEEDED FOR CHEST PAIN, MAY REPEAT FOR 3 DOSES. 25 tablet 5  . pantoprazole (PROTONIX) 40 MG tablet TAKE 1 TABLET ONCE DAILY. 90 tablet 2   No current facility-administered medications for this visit.    No Known Allergies  Review of Systems negative except from HPI and PMH  Physical Exam BP 118/60   Pulse 63   Ht 5\' 8"  (1.727 m)   Wt 225  lb 3.2 oz (102.2 kg)   BMI 34.24 kg/m  Well developed and well nourished in no acute distress HENT normal Neck supple with JVP-flat Clear Device pocket well healed; without hematoma or erythema.  There is no tethering  Regular rate and rhythm, no  murmur Abd-soft with active BS No Clubbing cyanosis edema Skin-warm and dry A & Oriented  Grossly normal sensory and motor function  ECG A pacing 63 14/10/44  Assessment and  Plan  Atrial fibrillation/flutter-paroxysmal  CHADS-VASc score 5 (age-71 hypertension-1 cardiomyopathy-1 vascular disease-1)  Aortic Valve Bicuspid previously described but no longer evident  Cardiomyopathy-resolved  Pacemaker-St. Jude  The patient's device was interrogated.  The information was reviewed. No changes were made in the programming.     Hypertension  Exertional chest discomfort  Cath non obstructive disease  Dofetilide   High Risk Medication Surveillance    Holding sinus on dofetilide  On Anticoagulation;  No bleeding issues   Euvolemic continue current meds  BP well controlled

## 2019-07-09 NOTE — Patient Instructions (Signed)
Medication Instructions:  Your physician recommends that you continue on your current medications as directed. Please refer to the Current Medication list given to you today.  Labwork: None ordered.  Testing/Procedures: None ordered.  Follow-Up: Your physician wants you to follow-up in: 6 months  You will receive a reminder letter in the mail two months in advance. If you don't receive a letter, please call our office to schedule the follow-up appointment.  Remote monitoring is used to monitor your Pacemaker of ICD from home. This monitoring reduces the number of office visits required to check your device to one time per year. It allows us to keep an eye on the functioning of your device to ensure it is working properly.   Any Other Special Instructions Will Be Listed Below (If Applicable).  If you need a refill on your cardiac medications before your next appointment, please call your pharmacy.   

## 2019-07-11 LAB — CUP PACEART INCLINIC DEVICE CHECK
Battery Remaining Longevity: 124 mo
Battery Voltage: 2.99 V
Brady Statistic RA Percent Paced: 99 %
Brady Statistic RV Percent Paced: 1.2 %
Date Time Interrogation Session: 20210223150816
Implantable Lead Implant Date: 20171002
Implantable Lead Implant Date: 20171002
Implantable Lead Location: 753859
Implantable Lead Location: 753860
Implantable Pulse Generator Implant Date: 20171002
Lead Channel Impedance Value: 425 Ohm
Lead Channel Impedance Value: 525 Ohm
Lead Channel Pacing Threshold Amplitude: 0.625 V
Lead Channel Pacing Threshold Amplitude: 0.75 V
Lead Channel Pacing Threshold Pulse Width: 0.5 ms
Lead Channel Pacing Threshold Pulse Width: 0.5 ms
Lead Channel Sensing Intrinsic Amplitude: 12 mV
Lead Channel Sensing Intrinsic Amplitude: 3.8 mV
Lead Channel Setting Pacing Amplitude: 1.625
Lead Channel Setting Pacing Amplitude: 2.5 V
Lead Channel Setting Pacing Pulse Width: 0.5 ms
Lead Channel Setting Sensing Sensitivity: 2 mV
Pulse Gen Model: 2272
Pulse Gen Serial Number: 7951215

## 2019-07-21 ENCOUNTER — Other Ambulatory Visit: Payer: Self-pay | Admitting: Internal Medicine

## 2019-07-30 ENCOUNTER — Encounter: Payer: Self-pay | Admitting: Internal Medicine

## 2019-08-05 DIAGNOSIS — G4733 Obstructive sleep apnea (adult) (pediatric): Secondary | ICD-10-CM | POA: Diagnosis not present

## 2019-08-06 ENCOUNTER — Other Ambulatory Visit (HOSPITAL_COMMUNITY): Payer: Self-pay | Admitting: Nurse Practitioner

## 2019-08-08 ENCOUNTER — Other Ambulatory Visit: Payer: Self-pay | Admitting: Internal Medicine

## 2019-08-08 NOTE — Telephone Encounter (Signed)
Pt last saw Dr Caryl Comes 07/09/19, last labs 02/25/19 Creat 0.97, age 86, weight 102.2kg, based on specified criteria pt is on appropriate dosage of Eliquis 5mg  BID.  Will refill rx.

## 2019-08-20 ENCOUNTER — Encounter: Payer: Self-pay | Admitting: Internal Medicine

## 2019-08-20 ENCOUNTER — Other Ambulatory Visit (INDEPENDENT_AMBULATORY_CARE_PROVIDER_SITE_OTHER): Payer: PPO

## 2019-08-20 ENCOUNTER — Ambulatory Visit (INDEPENDENT_AMBULATORY_CARE_PROVIDER_SITE_OTHER): Payer: PPO | Admitting: *Deleted

## 2019-08-20 ENCOUNTER — Ambulatory Visit (INDEPENDENT_AMBULATORY_CARE_PROVIDER_SITE_OTHER): Payer: PPO | Admitting: Internal Medicine

## 2019-08-20 VITALS — BP 118/70 | HR 61 | Temp 97.7°F | Ht 69.0 in | Wt 225.0 lb

## 2019-08-20 DIAGNOSIS — D500 Iron deficiency anemia secondary to blood loss (chronic): Secondary | ICD-10-CM

## 2019-08-20 DIAGNOSIS — R151 Fecal smearing: Secondary | ICD-10-CM

## 2019-08-20 DIAGNOSIS — R195 Other fecal abnormalities: Secondary | ICD-10-CM

## 2019-08-20 DIAGNOSIS — K580 Irritable bowel syndrome with diarrhea: Secondary | ICD-10-CM

## 2019-08-20 DIAGNOSIS — I480 Paroxysmal atrial fibrillation: Secondary | ICD-10-CM

## 2019-08-20 DIAGNOSIS — K31819 Angiodysplasia of stomach and duodenum without bleeding: Secondary | ICD-10-CM

## 2019-08-20 DIAGNOSIS — K228 Other specified diseases of esophagus: Secondary | ICD-10-CM

## 2019-08-20 LAB — CUP PACEART REMOTE DEVICE CHECK
Battery Remaining Longevity: 116 mo
Battery Remaining Percentage: 95.5 %
Battery Voltage: 2.99 V
Brady Statistic AP VP Percent: 1 %
Brady Statistic AP VS Percent: 99 %
Brady Statistic AS VP Percent: 1 %
Brady Statistic AS VS Percent: 1 %
Brady Statistic RA Percent Paced: 99 %
Brady Statistic RV Percent Paced: 1 %
Date Time Interrogation Session: 20210406020018
Implantable Lead Implant Date: 20171002
Implantable Lead Implant Date: 20171002
Implantable Lead Location: 753859
Implantable Lead Location: 753860
Implantable Pulse Generator Implant Date: 20171002
Lead Channel Impedance Value: 390 Ohm
Lead Channel Impedance Value: 530 Ohm
Lead Channel Pacing Threshold Amplitude: 0.625 V
Lead Channel Pacing Threshold Amplitude: 0.75 V
Lead Channel Pacing Threshold Pulse Width: 0.5 ms
Lead Channel Pacing Threshold Pulse Width: 0.5 ms
Lead Channel Sensing Intrinsic Amplitude: 12 mV
Lead Channel Sensing Intrinsic Amplitude: 2.5 mV
Lead Channel Setting Pacing Amplitude: 1.625
Lead Channel Setting Pacing Amplitude: 2.5 V
Lead Channel Setting Pacing Pulse Width: 0.5 ms
Lead Channel Setting Sensing Sensitivity: 2 mV
Pulse Gen Model: 2272
Pulse Gen Serial Number: 7951215

## 2019-08-20 LAB — CBC WITH DIFFERENTIAL/PLATELET
Basophils Absolute: 0.1 10*3/uL (ref 0.0–0.1)
Basophils Relative: 1 % (ref 0.0–3.0)
Eosinophils Absolute: 0.3 10*3/uL (ref 0.0–0.7)
Eosinophils Relative: 4.5 % (ref 0.0–5.0)
HCT: 37.3 % — ABNORMAL LOW (ref 39.0–52.0)
Hemoglobin: 12.9 g/dL — ABNORMAL LOW (ref 13.0–17.0)
Lymphocytes Relative: 26.3 % (ref 12.0–46.0)
Lymphs Abs: 2 10*3/uL (ref 0.7–4.0)
MCHC: 34.5 g/dL (ref 30.0–36.0)
MCV: 96.6 fl (ref 78.0–100.0)
Monocytes Absolute: 0.8 10*3/uL (ref 0.1–1.0)
Monocytes Relative: 10.9 % (ref 3.0–12.0)
Neutro Abs: 4.4 10*3/uL (ref 1.4–7.7)
Neutrophils Relative %: 57.3 % (ref 43.0–77.0)
Platelets: 192 10*3/uL (ref 150.0–400.0)
RBC: 3.86 Mil/uL — ABNORMAL LOW (ref 4.22–5.81)
RDW: 13.5 % (ref 11.5–15.5)
WBC: 7.6 10*3/uL (ref 4.0–10.5)

## 2019-08-20 LAB — IBC + FERRITIN
Ferritin: 31.5 ng/mL (ref 22.0–322.0)
Iron: 188 ug/dL — ABNORMAL HIGH (ref 42–165)
Saturation Ratios: 53.1 % — ABNORMAL HIGH (ref 20.0–50.0)
Transferrin: 253 mg/dL (ref 212.0–360.0)

## 2019-08-20 NOTE — Patient Instructions (Signed)
Continue Manufacturing systems engineer.  Please speak with your cardiologist and primary care physician about decreasing your magnesium.  Your provider has requested that you go to the basement level for lab work before leaving today. Press "B" on the elevator. The lab is located at the first door on the left as you exit the elevator.  If you are age 84 or older, your body mass index should be between 23-30. Your Body mass index is 33.23 kg/m. If this is out of the aforementioned range listed, please consider follow up with your Primary Care Provider.  If you are age 50 or younger, your body mass index should be between 19-25. Your Body mass index is 33.23 kg/m. If this is out of the aformentioned range listed, please consider follow up with your Primary Care Provider.   Due to recent changes in healthcare laws, you may see the results of your imaging and laboratory studies on MyChart before your provider has had a chance to review them.  We understand that in some cases there may be results that are confusing or concerning to you. Not all laboratory results come back in the same time frame and the provider may be waiting for multiple results in order to interpret others.  Please give [place] 48 hours in order for your provider to thoroughly review all the results before contacting the office for clarification of your results.

## 2019-08-20 NOTE — Progress Notes (Signed)
Subjective:    Patient ID: Donald [name], male    DOB: [birth year], 86 y.o.   MRN: [id number]  HPI Donald [name] is an 86 year old male with a past medical history of iron deficiency anemia felt secondary to gastric angiectasia, history of gastritis, history of colon polyps, colonic diverticulosis, internal hemorrhoids, IBS along with possible SIBO who is here for follow-up.  He was last seen on 02/15/2018.  He is here alone today.  He also has history of atrial fibrillation and takes chronic Eliquis and dofetilide, CAD with a pacemaker in place as well as hypertension.  He reports that he has been doing well though when he made the appointment he was having more frequent issues with abdominal gas, bloating and more frequent loose stools.  There were periods of fecal smearing.  He would also occasionally see bright red blood with wiping.  He reports he has continued Benefiber about 4 teaspoons a day which he has found helpful both for bowel regularity and also fecal smearing.  He also started Align 1 capsule daily about a month ago and reports this has significantly improved both his bloating, flatulence and loose stools.  He still having 2-4 loose but controllable bowel movements daily.  He has continued oral iron.  He does take magnesium 6 over-the-counter tablets at night and 2 in the morning.  These are 65 mg tablets for a total of 520 mg a day.  He started this for muscle cramping worse at night but also to keep magnesium levels up while he is on dofetilide.  He had his COVID-19 vaccination series without incident.  He has remained active though not as active as he previously was or he would like to be.  He played golf recently with his son and grandson.  He lives at [employer]  He sees Dr. Eilleen Kempf for primary care and Dr. Jolyn Nap for atrial fibrillation  Review of Systems As per HPI, otherwise negative  Current Medications, Allergies, Past Medical History, Past Surgical History,  Family History and Social History were reviewed in [facility] record.      Objective:   Physical Exam BP 118/70   Pulse 61   Temp 97.7 F (36.5 C)   Ht 5\' 9"  (1.753 m)   Wt 225 lb (102.1 kg)   BMI 33.23 kg/m  Gen: awake, alert, NAD HEENT: anicteric CV: RRR, no mrg Pulm: CTA b/l Abd: soft, obese, NT/ND, +BS throughout Ext: no c/c/e Neuro: nonfocal     Assessment & Plan:  86 year old male with a past medical history of iron deficiency anemia felt secondary to gastric angiectasia, history of gastritis, history of colon polyps, colonic diverticulosis, internal hemorrhoids, IBS along with possible SIBO who is here for follow-up.   1.  Loose stools/fecal smearing/IBS versus SIBO/internal hemorrhoids --we discussed his symptoms at length today.  He did benefit though for a rather short time from rifaximin therapy several years ago.  He has started a probiotic with Align daily in conjunction with his Benefiber and this combination has helped both with intestinal gas but also improving loose stools.  I think it is quite possible that his magnesium supplement is worsening his loose stool/diarrhea and we discussed this today.  I would like him to discuss reducing magnesium with Dr. Caryl Comes to ensure doing so will not interfere with his atrial fibrillation medical therapy.  He can also discuss this with Dr. Ronnald Ramp to see if there are other options for muscle cramping worse  at night.  2.  IDA --history of iron deficiency felt secondary to gastritis and gastric angiectasia.  He has had no overt bleeding and hemoglobin has remained very normal over time recently.  His last hemoglobin was 14 in October 2020.  I will repeat CBC and iron studies today.  He will continue once daily PPI, pantoprazole 40 mg, given his history of gastritis and gastric angiectasia.  3.  History of adenomatous colon polyp and incomplete colonoscopy --he had an incomplete colonoscopy in 2016 but a virtual  colonoscopy thereafter which showed no polypoid lesions.  He can follow-up as needed  30 minutes total spent today including patient facing time, coordination of care, reviewing medical history/procedures/pertinent radiology studies, and documentation of the encounter.

## 2019-08-21 ENCOUNTER — Other Ambulatory Visit: Payer: Self-pay

## 2019-08-21 DIAGNOSIS — D500 Iron deficiency anemia secondary to blood loss (chronic): Secondary | ICD-10-CM

## 2019-08-21 NOTE — Progress Notes (Signed)
PPM Remote  

## 2019-08-23 ENCOUNTER — Other Ambulatory Visit: Payer: Self-pay | Admitting: Internal Medicine

## 2019-09-02 DIAGNOSIS — Z85828 Personal history of other malignant neoplasm of skin: Secondary | ICD-10-CM | POA: Diagnosis not present

## 2019-09-02 DIAGNOSIS — L720 Epidermal cyst: Secondary | ICD-10-CM | POA: Diagnosis not present

## 2019-09-02 DIAGNOSIS — D225 Melanocytic nevi of trunk: Secondary | ICD-10-CM | POA: Diagnosis not present

## 2019-09-02 DIAGNOSIS — L570 Actinic keratosis: Secondary | ICD-10-CM | POA: Diagnosis not present

## 2019-09-02 DIAGNOSIS — D485 Neoplasm of uncertain behavior of skin: Secondary | ICD-10-CM | POA: Diagnosis not present

## 2019-09-02 DIAGNOSIS — L821 Other seborrheic keratosis: Secondary | ICD-10-CM | POA: Diagnosis not present

## 2019-09-02 DIAGNOSIS — D0439 Carcinoma in situ of skin of other parts of face: Secondary | ICD-10-CM | POA: Diagnosis not present

## 2019-09-02 DIAGNOSIS — C4441 Basal cell carcinoma of skin of scalp and neck: Secondary | ICD-10-CM | POA: Diagnosis not present

## 2019-09-04 DIAGNOSIS — G4733 Obstructive sleep apnea (adult) (pediatric): Secondary | ICD-10-CM | POA: Diagnosis not present

## 2019-09-16 ENCOUNTER — Other Ambulatory Visit: Payer: Self-pay | Admitting: Internal Medicine

## 2019-09-25 ENCOUNTER — Other Ambulatory Visit: Payer: Self-pay | Admitting: Internal Medicine

## 2019-09-25 DIAGNOSIS — C4441 Basal cell carcinoma of skin of scalp and neck: Secondary | ICD-10-CM | POA: Diagnosis not present

## 2019-09-25 DIAGNOSIS — C44329 Squamous cell carcinoma of skin of other parts of face: Secondary | ICD-10-CM | POA: Diagnosis not present

## 2019-09-25 DIAGNOSIS — Z85828 Personal history of other malignant neoplasm of skin: Secondary | ICD-10-CM | POA: Diagnosis not present

## 2019-09-25 DIAGNOSIS — G4733 Obstructive sleep apnea (adult) (pediatric): Secondary | ICD-10-CM | POA: Diagnosis not present

## 2019-10-02 DIAGNOSIS — D485 Neoplasm of uncertain behavior of skin: Secondary | ICD-10-CM | POA: Diagnosis not present

## 2019-10-02 DIAGNOSIS — Z85828 Personal history of other malignant neoplasm of skin: Secondary | ICD-10-CM | POA: Diagnosis not present

## 2019-10-02 DIAGNOSIS — L988 Other specified disorders of the skin and subcutaneous tissue: Secondary | ICD-10-CM | POA: Diagnosis not present

## 2019-10-21 DIAGNOSIS — G4733 Obstructive sleep apnea (adult) (pediatric): Secondary | ICD-10-CM | POA: Diagnosis not present

## 2019-11-03 ENCOUNTER — Other Ambulatory Visit (HOSPITAL_COMMUNITY): Payer: Self-pay | Admitting: Nurse Practitioner

## 2019-11-03 ENCOUNTER — Other Ambulatory Visit: Payer: Self-pay | Admitting: Internal Medicine

## 2019-11-03 DIAGNOSIS — E785 Hyperlipidemia, unspecified: Secondary | ICD-10-CM

## 2019-11-15 DIAGNOSIS — G4733 Obstructive sleep apnea (adult) (pediatric): Secondary | ICD-10-CM | POA: Diagnosis not present

## 2019-11-19 ENCOUNTER — Ambulatory Visit (INDEPENDENT_AMBULATORY_CARE_PROVIDER_SITE_OTHER): Payer: PPO | Admitting: *Deleted

## 2019-11-19 DIAGNOSIS — I4891 Unspecified atrial fibrillation: Secondary | ICD-10-CM | POA: Diagnosis not present

## 2019-11-19 LAB — CUP PACEART REMOTE DEVICE CHECK
Battery Remaining Longevity: 117 mo
Battery Remaining Percentage: 95.5 %
Battery Voltage: 2.99 V
Brady Statistic AP VP Percent: 1 %
Brady Statistic AP VS Percent: 99 %
Brady Statistic AS VP Percent: 1 %
Brady Statistic AS VS Percent: 1 %
Brady Statistic RA Percent Paced: 99 %
Brady Statistic RV Percent Paced: 1 %
Date Time Interrogation Session: 20210706020023
Implantable Lead Implant Date: 20171002
Implantable Lead Implant Date: 20171002
Implantable Lead Location: 753859
Implantable Lead Location: 753860
Implantable Pulse Generator Implant Date: 20171002
Lead Channel Impedance Value: 410 Ohm
Lead Channel Impedance Value: 540 Ohm
Lead Channel Pacing Threshold Amplitude: 0.625 V
Lead Channel Pacing Threshold Amplitude: 0.75 V
Lead Channel Pacing Threshold Pulse Width: 0.5 ms
Lead Channel Pacing Threshold Pulse Width: 0.5 ms
Lead Channel Sensing Intrinsic Amplitude: 12 mV
Lead Channel Sensing Intrinsic Amplitude: 2.8 mV
Lead Channel Setting Pacing Amplitude: 1.625
Lead Channel Setting Pacing Amplitude: 2.5 V
Lead Channel Setting Pacing Pulse Width: 0.5 ms
Lead Channel Setting Sensing Sensitivity: 2 mV
Pulse Gen Model: 2272
Pulse Gen Serial Number: 7951215

## 2019-11-20 ENCOUNTER — Telehealth: Payer: Self-pay | Admitting: Emergency Medicine

## 2019-11-20 NOTE — Telephone Encounter (Signed)
Call made in response to myChart questions concerning 91 day transmission . Education done that AF episodes are not new finding for this patient. Questions answered concerning acceptable HR range and when treatment in ED should be considered.CLR

## 2019-11-20 NOTE — Progress Notes (Signed)
Remote pacemaker transmission.   

## 2019-12-09 DIAGNOSIS — D1801 Hemangioma of skin and subcutaneous tissue: Secondary | ICD-10-CM | POA: Diagnosis not present

## 2019-12-09 DIAGNOSIS — L821 Other seborrheic keratosis: Secondary | ICD-10-CM | POA: Diagnosis not present

## 2019-12-09 DIAGNOSIS — D485 Neoplasm of uncertain behavior of skin: Secondary | ICD-10-CM | POA: Diagnosis not present

## 2019-12-09 DIAGNOSIS — C44719 Basal cell carcinoma of skin of left lower limb, including hip: Secondary | ICD-10-CM | POA: Diagnosis not present

## 2019-12-09 DIAGNOSIS — L570 Actinic keratosis: Secondary | ICD-10-CM | POA: Diagnosis not present

## 2019-12-09 DIAGNOSIS — D225 Melanocytic nevi of trunk: Secondary | ICD-10-CM | POA: Diagnosis not present

## 2019-12-09 DIAGNOSIS — Z85828 Personal history of other malignant neoplasm of skin: Secondary | ICD-10-CM | POA: Diagnosis not present

## 2019-12-19 ENCOUNTER — Other Ambulatory Visit: Payer: Self-pay | Admitting: Internal Medicine

## 2019-12-27 ENCOUNTER — Encounter: Payer: Self-pay | Admitting: Internal Medicine

## 2019-12-27 ENCOUNTER — Telehealth: Payer: Self-pay

## 2019-12-27 DIAGNOSIS — H9220 Otorrhagia, unspecified ear: Secondary | ICD-10-CM

## 2019-12-27 NOTE — Telephone Encounter (Signed)
Pt sent my chart message as well. Closing this note.

## 2019-12-27 NOTE — Telephone Encounter (Signed)
New message   Need a referral to ENT reason inner ear bleeding

## 2020-01-26 ENCOUNTER — Other Ambulatory Visit: Payer: Self-pay | Admitting: Internal Medicine

## 2020-01-26 DIAGNOSIS — E785 Hyperlipidemia, unspecified: Secondary | ICD-10-CM

## 2020-01-31 DIAGNOSIS — G4733 Obstructive sleep apnea (adult) (pediatric): Secondary | ICD-10-CM | POA: Diagnosis not present

## 2020-02-03 ENCOUNTER — Other Ambulatory Visit: Payer: Self-pay | Admitting: Internal Medicine

## 2020-02-09 ENCOUNTER — Other Ambulatory Visit: Payer: Self-pay | Admitting: Internal Medicine

## 2020-02-10 NOTE — Telephone Encounter (Signed)
Eliquis 5mg  refill request received. Patient is 86 years old, weight-102.1kg, Crea-0.97 on 02/25/2019, Diagnosis-Afib, and last seen by Dr. Caryl Comes on 07/09/2019. Dose is appropriate based on dosing criteria. Will send in refill to requested pharmacy.

## 2020-02-18 ENCOUNTER — Ambulatory Visit (INDEPENDENT_AMBULATORY_CARE_PROVIDER_SITE_OTHER): Payer: PPO

## 2020-02-18 DIAGNOSIS — I4891 Unspecified atrial fibrillation: Secondary | ICD-10-CM

## 2020-02-19 LAB — CUP PACEART REMOTE DEVICE CHECK
Battery Remaining Longevity: 116 mo
Battery Remaining Percentage: 95.5 %
Battery Voltage: 2.99 V
Brady Statistic AP VP Percent: 1 %
Brady Statistic AP VS Percent: 99 %
Brady Statistic AS VP Percent: 1 %
Brady Statistic AS VS Percent: 1 %
Brady Statistic RA Percent Paced: 99 %
Brady Statistic RV Percent Paced: 1 %
Date Time Interrogation Session: 20211005025248
Implantable Lead Implant Date: 20171002
Implantable Lead Implant Date: 20171002
Implantable Lead Location: 753859
Implantable Lead Location: 753860
Implantable Pulse Generator Implant Date: 20171002
Lead Channel Impedance Value: 390 Ohm
Lead Channel Impedance Value: 540 Ohm
Lead Channel Pacing Threshold Amplitude: 0.625 V
Lead Channel Pacing Threshold Amplitude: 0.75 V
Lead Channel Pacing Threshold Pulse Width: 0.5 ms
Lead Channel Pacing Threshold Pulse Width: 0.5 ms
Lead Channel Sensing Intrinsic Amplitude: 12 mV
Lead Channel Sensing Intrinsic Amplitude: 3.8 mV
Lead Channel Setting Pacing Amplitude: 1.625
Lead Channel Setting Pacing Amplitude: 2.5 V
Lead Channel Setting Pacing Pulse Width: 0.5 ms
Lead Channel Setting Sensing Sensitivity: 2 mV
Pulse Gen Model: 2272
Pulse Gen Serial Number: 7951215

## 2020-02-21 ENCOUNTER — Encounter: Payer: Self-pay | Admitting: Internal Medicine

## 2020-02-21 NOTE — Progress Notes (Signed)
Remote pacemaker transmission.   

## 2020-02-24 ENCOUNTER — Other Ambulatory Visit (INDEPENDENT_AMBULATORY_CARE_PROVIDER_SITE_OTHER): Payer: PPO

## 2020-02-24 DIAGNOSIS — D500 Iron deficiency anemia secondary to blood loss (chronic): Secondary | ICD-10-CM | POA: Diagnosis not present

## 2020-02-24 LAB — CBC WITH DIFFERENTIAL/PLATELET
Basophils Absolute: 0.1 10*3/uL (ref 0.0–0.1)
Basophils Relative: 0.9 % (ref 0.0–3.0)
Eosinophils Absolute: 0.2 10*3/uL (ref 0.0–0.7)
Eosinophils Relative: 2.6 % (ref 0.0–5.0)
HCT: 42.1 % (ref 39.0–52.0)
Hemoglobin: 14.2 g/dL (ref 13.0–17.0)
Lymphocytes Relative: 22 % (ref 12.0–46.0)
Lymphs Abs: 1.8 10*3/uL (ref 0.7–4.0)
MCHC: 33.8 g/dL (ref 30.0–36.0)
MCV: 99.1 fl (ref 78.0–100.0)
Monocytes Absolute: 0.7 10*3/uL (ref 0.1–1.0)
Monocytes Relative: 8.8 % (ref 3.0–12.0)
Neutro Abs: 5.4 10*3/uL (ref 1.4–7.7)
Neutrophils Relative %: 65.7 % (ref 43.0–77.0)
Platelets: 193 10*3/uL (ref 150.0–400.0)
RBC: 4.24 Mil/uL (ref 4.22–5.81)
RDW: 13.9 % (ref 11.5–15.5)
WBC: 8.3 10*3/uL (ref 4.0–10.5)

## 2020-02-24 LAB — IBC + FERRITIN
Ferritin: 44.1 ng/mL (ref 22.0–322.0)
Iron: 151 ug/dL (ref 42–165)
Saturation Ratios: 42 % (ref 20.0–50.0)
Transferrin: 257 mg/dL (ref 212.0–360.0)

## 2020-02-25 ENCOUNTER — Other Ambulatory Visit: Payer: Self-pay

## 2020-02-25 DIAGNOSIS — D500 Iron deficiency anemia secondary to blood loss (chronic): Secondary | ICD-10-CM

## 2020-02-26 ENCOUNTER — Other Ambulatory Visit: Payer: Self-pay | Admitting: Internal Medicine

## 2020-03-02 ENCOUNTER — Encounter: Payer: Self-pay | Admitting: Internal Medicine

## 2020-03-05 ENCOUNTER — Other Ambulatory Visit: Payer: Self-pay

## 2020-03-05 ENCOUNTER — Ambulatory Visit (INDEPENDENT_AMBULATORY_CARE_PROVIDER_SITE_OTHER): Payer: PPO | Admitting: Internal Medicine

## 2020-03-05 ENCOUNTER — Encounter: Payer: Self-pay | Admitting: Internal Medicine

## 2020-03-05 VITALS — BP 124/60 | HR 60 | Temp 98.2°F | Ht 69.0 in | Wt 225.0 lb

## 2020-03-05 DIAGNOSIS — Z0000 Encounter for general adult medical examination without abnormal findings: Secondary | ICD-10-CM

## 2020-03-05 DIAGNOSIS — I480 Paroxysmal atrial fibrillation: Secondary | ICD-10-CM

## 2020-03-05 DIAGNOSIS — G4733 Obstructive sleep apnea (adult) (pediatric): Secondary | ICD-10-CM | POA: Diagnosis not present

## 2020-03-05 DIAGNOSIS — E785 Hyperlipidemia, unspecified: Secondary | ICD-10-CM

## 2020-03-05 DIAGNOSIS — I10 Essential (primary) hypertension: Secondary | ICD-10-CM | POA: Diagnosis not present

## 2020-03-05 LAB — HEPATIC FUNCTION PANEL
ALT: 17 U/L (ref 0–53)
AST: 20 U/L (ref 0–37)
Albumin: 4.1 g/dL (ref 3.5–5.2)
Alkaline Phosphatase: 70 U/L (ref 39–117)
Bilirubin, Direct: 0.1 mg/dL (ref 0.0–0.3)
Total Bilirubin: 0.5 mg/dL (ref 0.2–1.2)
Total Protein: 6.8 g/dL (ref 6.0–8.3)

## 2020-03-05 LAB — LIPID PANEL
Cholesterol: 185 mg/dL (ref 0–200)
HDL: 47.2 mg/dL (ref >39.00)
LDL Cholesterol: 124 mg/dL — ABNORMAL HIGH (ref 0–99)
NonHDL: 138.11
Total CHOL/HDL Ratio: 4
Triglycerides: 71 mg/dL (ref 0.0–149.0)
VLDL: 14.2 mg/dL (ref 0.0–40.0)

## 2020-03-05 LAB — TSH: TSH: 0.65 u[IU]/mL (ref 0.35–4.50)

## 2020-03-05 MED ORDER — ATORVASTATIN CALCIUM 40 MG PO TABS: 40.0000 mg | ORAL_TABLET | Freq: Every day | ORAL | 1 refills | Status: DC

## 2020-03-05 NOTE — Patient Instructions (Signed)

## 2020-03-05 NOTE — Progress Notes (Signed)
Subjective:  Patient ID: Donald [name], male    DOB: [birth year]  Age: 86 y.o. MRN: [id number]  CC: Annual Exam and Hyperlipidemia  This visit occurred during the SARS-CoV-2 public health emergency.  Safety protocols were in place, including screening questions prior to the visit, additional usage of staff PPE, and extensive cleaning of exam room while observing appropriate contact time as indicated for disinfecting solutions.    HPI [name] presents for a CPX.  He requests that I sign a DNR. He tells me his heart rate has been well controlled. He is compliant with the CCB, statin, and DOAC. He has mild lower extremity edema but he denies CP, DOE, palpitations, dizziness, or lightheadedness.  Outpatient Medications Prior to Visit  Medication Sig Dispense Refill   diltiazem (CARDIZEM CD) 120 MG 24 hr capsule TAKE (1) CAPSULE TWICE DAILY. 180 capsule 0   dofetilide (TIKOSYN) 500 MCG capsule TAKE ONE CAPSULE BY MOUTH TWO TIMES A DAY 180 capsule 2   ELIQUIS 5 MG TABS tablet TAKE 1 TABLET BY MOUTH TWICE DAILY. 60 tablet 3   ferrous sulfate 325 (65 FE) MG tablet Take 1 tablet (325 mg total) by mouth daily with breakfast. 60 tablet 3   FIBER PO Take by mouth.     furosemide (LASIX) 20 MG tablet TAKE 1 TABLET EACH DAY. 90 tablet 3   isosorbide mononitrate (IMDUR) 30 MG 24 hr tablet TAKE 1 TABLET BY MOUTH DAILY. 90 tablet 3   Magnesium 65 MG TABS Take 7 tablets by mouth at bedtime.      metoprolol tartrate (LOPRESSOR) 25 MG tablet TAKE 1 TABLET BY MOUTH TWICE DAILY. 180 tablet 3   metroNIDAZOLE (METROGEL) 0.75 % gel Apply 1 application topically daily as needed (for rosacea).      Multiple Vitamins-Minerals (EYE VITAMINS) CAPS Take 1 capsule by mouth 2 (two) times daily.     Multiple Vitamins-Minerals (MULTIVITAMIN PO) Take 1 tablet by mouth 2 (two) times daily.     nitroGLYCERIN (NITROSTAT) 0.4 MG SL tablet PLACE 1 TABLET UNDER THE TONGUE EVERY 5 MINUTES AS NEEDED FOR CHEST  PAIN, MAY REPEAT FOR 3 DOSES. 25 tablet 5   pantoprazole (PROTONIX) 40 MG tablet TAKE 1 TABLET ONCE DAILY. 90 tablet 0   Probiotic Product (ALIGN PO) Take by mouth.     atorvastatin (LIPITOR) 40 MG tablet TAKE 1 TABLET ONCE DAILY. 90 tablet 0   No facility-administered medications prior to visit.    ROS Review of Systems  Constitutional: Negative for appetite change, diaphoresis, fatigue and unexpected weight change.  HENT: Negative.   Eyes: Negative.   Respiratory: Negative for cough, chest tightness, shortness of breath and wheezing.   Cardiovascular: Negative for chest pain, palpitations and leg swelling.  Gastrointestinal: Negative for abdominal pain, constipation, diarrhea, nausea and vomiting.  Endocrine: Negative.   Genitourinary: Negative.  Negative for difficulty urinating.  Musculoskeletal: Negative for arthralgias and myalgias.  Skin: Negative.  Negative for color change.  Neurological: Negative.  Negative for dizziness, weakness, light-headedness and headaches.  Hematological: Negative for adenopathy. Does not bruise/bleed easily.  Psychiatric/Behavioral: Negative.     Objective:  BP 124/60    Pulse 60    Temp 98.2 F (36.8 C) (Oral)    Ht 5\' 9"  (1.753 m)    Wt 225 lb (102.1 kg)    SpO2 95%    BMI 33.23 kg/m   BP Readings from Last 3 Encounters:  03/05/20 124/60  08/20/19 118/70  07/09/19 118/60  Wt Readings from Last 3 Encounters:  03/05/20 225 lb (102.1 kg)  08/20/19 225 lb (102.1 kg)  07/09/19 225 lb 3.2 oz (102.2 kg)    Physical Exam Vitals reviewed.  HENT:     Nose: Nose normal.     Mouth/Throat:     Mouth: Mucous membranes are moist.  Eyes:     General: No scleral icterus.    Conjunctiva/sclera: Conjunctivae normal.  Cardiovascular:     Rate and Rhythm: Normal rate and regular rhythm.     Heart sounds: No murmur heard.   Pulmonary:     Effort: Pulmonary effort is normal. No respiratory distress.     Breath sounds: No stridor. No  wheezing, rhonchi or rales.  Abdominal:     General: Abdomen is protuberant. Bowel sounds are normal. There is no distension.     Palpations: Abdomen is soft. There is no hepatomegaly, splenomegaly or mass.     Tenderness: There is no abdominal tenderness.     Hernia: No hernia is present.  Musculoskeletal:        General: Normal range of motion.     Cervical back: Neck supple.     Right lower leg: Edema (trace) present.     Left lower leg: Edema (trace) present.  Lymphadenopathy:     Cervical: No cervical adenopathy.  Skin:    General: Skin is warm and dry.     Coloration: Skin is not pale.  Neurological:     General: No focal deficit present.     Mental Status: He is alert. Mental status is at baseline.  Psychiatric:        Mood and Affect: Mood normal.        Behavior: Behavior normal.     Lab Results  Component Value Date   WBC 8.3 02/24/2020   HGB 14.2 02/24/2020   HCT 42.1 02/24/2020   PLT 193.0 02/24/2020   GLUCOSE 95 03/05/2020   CHOL 185 03/05/2020   TRIG 71.0 03/05/2020   HDL 47.20 03/05/2020   LDLDIRECT 138.8 04/18/2011   LDLCALC 124 (H) 03/05/2020   ALT 17 03/05/2020   AST 20 03/05/2020   NA 138 03/05/2020   K 4.8 03/05/2020   CL 104 03/05/2020   CREATININE 1.03 03/05/2020   BUN 22 03/05/2020   CO2 28 03/05/2020   TSH 0.65 03/05/2020   PSA 0.83 11/20/2013   INR 1.52 06/08/2017    CT Chest High Resolution  Result Date: 04/19/2019 CLINICAL DATA:  Follow-up interstitial lung disease. Chronic dyspnea. EXAM: CT CHEST WITHOUT CONTRAST TECHNIQUE: Multidetector CT imaging of the chest was performed following the standard protocol without intravenous contrast. High resolution imaging of the lungs, as well as inspiratory and expiratory imaging, was performed. COMPARISON:  04/23/2018 high-resolution chest CT. FINDINGS: Cardiovascular: Normal heart size. No significant pericardial effusion/thickening. Stable configuration of 2 lead left subclavian pacemaker with  lead tips in the right atrium and right ventricular apex. Three-vessel coronary atherosclerosis. Aortic valvular calcifications. Atherosclerotic thoracic aorta with stable ectatic 4.1 cm ascending thoracic aorta. Normal caliber main pulmonary artery. Mediastinum/Nodes: No discrete thyroid nodules. Unremarkable esophagus. No axillary adenopathy. Stable mildly enlarged 1.3 cm right paratracheal node (series 2/image 51), compatible with benign reactive etiology. No new pathologically enlarged mediastinal nodes. Stable nonenlarged coarsely calcified right hilar nodes from prior granulomatous disease. No discrete noncalcified hilar adenopathy on these noncontrast images. Lungs/Pleura: No pneumothorax. No pleural effusion. Mild centrilobular and paraseptal emphysema with mild diffuse bronchial wall thickening. Stable tiny calcified right middle  lobe granulomas. Stable 3 mm solid lingular pulmonary nodule (series 3/image 73), considered benign. No acute consolidative airspace disease, lung masses or new significant pulmonary nodules. There is mild patchy subpleural reticulation and ground-glass attenuation in both lungs, which persists on the prone sequence. Findings have not appreciably changed in the interval. There is a dependent predominance to these findings without clear apicobasilar gradient. No significant regions of traction bronchiectasis, architectural distortion or frank honeycombing. No significant lobular air trapping or evidence of tracheobronchomalacia on the expiration sequence. Upper abdomen: Small hiatal hernia. Scattered simple liver cysts, largest 2.5 cm in the posterior right liver lobe. Simple 3.7 cm renal cyst in the upper right kidney. Musculoskeletal: No aggressive appearing focal osseous lesions. Moderate thoracic spondylosis. IMPRESSION: 1. Spectrum of findings suggestive of interstitial lung disease in the dependent lungs without bronchiectasis or honeycomb, which persists on the prone  sequence. No evidence of interval progression since 04/23/2018 high-resolution chest CT study. Nonspecific interstitial pneumonia (NSIP) favored. Usual interstitial pneumonia (UIP) not excluded. Findings are indeterminate for UIP per consensus guidelines: Diagnosis of Idiopathic Pulmonary Fibrosis: An Official ATS/ERS/JRS/ALAT Clinical Practice Guideline. [place], Iss 5, [phone], Jan 14 2017. 2. Stable ectatic 4.1 cm ascending thoracic aorta. Recommend annual imaging followup by CTA or MRA. This recommendation follows 2010 ACCF/AHA/AATS/ACR/ASA/SCA/SCAI/SIR/STS/SVM Guidelines for the Diagnosis and Management of Patients with Thoracic Aortic Disease. Circulation. 2010; 121: N003-B048. Aortic aneurysm NOS (ICD10-I71.9). 3. Mild centrilobular and paraseptal emphysema with mild diffuse bronchial wall thickening, suggesting COPD. 4. Three-vessel coronary atherosclerosis. 5. Small hiatal hernia. Aortic Atherosclerosis (ICD10-I70.0) and Emphysema (ICD10-J43.9). Electronically Signed   By: Ilona Sorrel M.D.   On: 04/19/2019 12:42    Assessment & Plan:   Nelton was seen today for annual exam and hyperlipidemia.  Diagnoses and all orders for this visit:  Essential hypertension- His blood pressure is adequately well controlled. -     Basic metabolic panel; Future -     TSH; Future -     TSH -     Basic metabolic panel  PAF (paroxysmal atrial fibrillation) ([place])- He has good rate and rhythm control. Will continue the CCB and the DOAC. -     TSH; Future -     TSH  Hyperlipidemia with target LDL less than 100- He has achieved his LDL goal is doing well on the statin. -     Lipid panel; Future -     TSH; Future -     Hepatic function panel; Future -     atorvastatin (LIPITOR) 40 MG tablet; Take 1 tablet (40 mg total) by mouth daily. -     Hepatic function panel -     TSH -     Lipid panel  Routine health maintenance- Exam completed, labs reviewed, vaccines reviewed and  updated, no cancer screenings are indicated, patient education was given.   I have changed [name]'s atorvastatin. I am also having him maintain his ferrous sulfate, Multiple Vitamins-Minerals (MULTIVITAMIN PO), nitroGLYCERIN, metroNIDAZOLE, Eye Vitamins, Magnesium, metoprolol tartrate, FIBER PO, Probiotic Product (ALIGN PO), isosorbide mononitrate, furosemide, dofetilide, pantoprazole, diltiazem, and Eliquis.  Meds ordered this encounter  Medications   atorvastatin (LIPITOR) 40 MG tablet    Sig: Take 1 tablet (40 mg total) by mouth daily.    Dispense:  90 tablet    Refill:  1     Follow-up: Return in about 6 months (around 09/03/2020).  [name], MD

## 2020-03-06 ENCOUNTER — Encounter: Payer: Self-pay | Admitting: Internal Medicine

## 2020-03-06 LAB — BASIC METABOLIC PANEL
BUN: 22 mg/dL (ref 6–23)
CO2: 28 mEq/L (ref 19–32)
Calcium: 9.4 mg/dL (ref 8.4–10.5)
Chloride: 104 mEq/L (ref 96–112)
Creatinine, Ser: 1.03 mg/dL (ref 0.40–1.50)
GFR: 71 mL/min (ref >60.00)
Glucose, Bld: 95 mg/dL (ref 70–99)
Potassium: 4.8 mEq/L (ref 3.5–5.1)
Sodium: 138 mEq/L (ref 135–145)

## 2020-03-10 DIAGNOSIS — D692 Other nonthrombocytopenic purpura: Secondary | ICD-10-CM | POA: Diagnosis not present

## 2020-03-10 DIAGNOSIS — C44319 Basal cell carcinoma of skin of other parts of face: Secondary | ICD-10-CM | POA: Diagnosis not present

## 2020-03-10 DIAGNOSIS — L821 Other seborrheic keratosis: Secondary | ICD-10-CM | POA: Diagnosis not present

## 2020-03-10 DIAGNOSIS — D225 Melanocytic nevi of trunk: Secondary | ICD-10-CM | POA: Diagnosis not present

## 2020-03-10 DIAGNOSIS — D485 Neoplasm of uncertain behavior of skin: Secondary | ICD-10-CM | POA: Diagnosis not present

## 2020-03-10 DIAGNOSIS — Z85828 Personal history of other malignant neoplasm of skin: Secondary | ICD-10-CM | POA: Diagnosis not present

## 2020-03-10 DIAGNOSIS — L570 Actinic keratosis: Secondary | ICD-10-CM | POA: Diagnosis not present

## 2020-03-10 DIAGNOSIS — L918 Other hypertrophic disorders of the skin: Secondary | ICD-10-CM | POA: Diagnosis not present

## 2020-03-22 ENCOUNTER — Other Ambulatory Visit: Payer: Self-pay | Admitting: Internal Medicine

## 2020-03-30 DIAGNOSIS — Z85828 Personal history of other malignant neoplasm of skin: Secondary | ICD-10-CM | POA: Diagnosis not present

## 2020-03-30 DIAGNOSIS — C44319 Basal cell carcinoma of skin of other parts of face: Secondary | ICD-10-CM | POA: Diagnosis not present

## 2020-04-06 DIAGNOSIS — Z85828 Personal history of other malignant neoplasm of skin: Secondary | ICD-10-CM | POA: Diagnosis not present

## 2020-04-06 DIAGNOSIS — C44319 Basal cell carcinoma of skin of other parts of face: Secondary | ICD-10-CM | POA: Diagnosis not present

## 2020-04-13 DIAGNOSIS — L570 Actinic keratosis: Secondary | ICD-10-CM | POA: Diagnosis not present

## 2020-04-27 ENCOUNTER — Other Ambulatory Visit: Payer: Self-pay

## 2020-04-27 ENCOUNTER — Ambulatory Visit: Payer: PPO | Admitting: Adult Health

## 2020-04-27 ENCOUNTER — Ambulatory Visit: Payer: PPO | Admitting: Acute Care

## 2020-04-27 ENCOUNTER — Encounter: Payer: Self-pay | Admitting: Acute Care

## 2020-04-27 VITALS — BP 118/64 | HR 61 | Temp 97.8°F | Ht 69.0 in | Wt 223.8 lb

## 2020-04-27 DIAGNOSIS — J849 Interstitial pulmonary disease, unspecified: Secondary | ICD-10-CM

## 2020-04-27 DIAGNOSIS — G4733 Obstructive sleep apnea (adult) (pediatric): Secondary | ICD-10-CM | POA: Diagnosis not present

## 2020-04-27 NOTE — Progress Notes (Signed)
History of Present Illness Donald [name] is a 86 y.o. male former smoker with 40 pack year smoking history, quit 2010 with OSA n CPAP and ILD. PMH of atrial fibrillation ( Brother died of pulmonary fibrosis).He is followed by Dr. Elsworth [name] for his OSA and Donald [name] / Donald [name] for his ILD.   Synopsis 86 year old with recurrent atrial fibrillation, OSA, ex-smoker.  Evaluated in the ILD clinic on 05/24/2022 after high-resolution CT scan that showed pulmonary fibrosis.  At that point he was being considered for amiodarone for atrial fibrillation.  Seen by Dr. Chase [name] and recommended against amiodarone.  He is currently on Tikosyn and follows with Dr. Caryl [name], [place]  04/27/2020  Pt. Presents for annual follow up for OSA and ILD. He denies any problems with mask or pressure is very compliant with his CPAP machine.He has had his current CPAP device for 6 years and would like a new machine.His compliance is excellent. He uses his CPAP set pressure 13 cm H2O  every night for 8.5 hours. AHI is 3.8 . He is benefiting from treatment.  He states he has not had follow up of his ILD. He says that Covid 19 pushed the pause button on his work up. He had discussed bronch with BAL and envisia classifier with Dr. Vaughan [name]. . If this was negative, the plan was for monitoring for progression of disease.  He has had a stable interval. He does not feel he is limited by his breathing. He had sats today of 96% on RA after walking to the exam room. We have discussed getting an HRCT and PFT's to evaluate for progression of disease and following up with Dr. Vaughan [name] after imaging and PFT's to determine need for BAL. He is in agreement with this plan.   Test Results:  Down Load 03/23/2020-04/21/2020 Set Pressure 13 cm H2O           Spirometry - FEV1 ratio of 75, with FEV1 of 77% and FVC of 77% suggesting mild restriction CT chest in 04/2013 showed mild emphysema PSG 04/2014 - 206 lbs-Moderate OSA-stopped breathing 24 times an  hour.- corrected by C Pap 13 cm, large full facemask  Imaging: CT chest 05/07/2013- mild emphysematous changes, compressive atelectasis in the right lower lobe.   CT high-resolution 04/23/2018- bronchial wall thickening with mild emphysema.  Patchy areas of peripheral septal thickening, mild groundglass.  Indeterminate for UIP. I have reviewed the images personally.  HRCT 04/18/2020: Spectrum of findings suggestive of interstitial lung disease in the dependent lungs without bronchiectasis or honeycomb, which persists on the prone sequence. No evidence of interval progression since 04/23/2018 high-resolution chest CT study. Nonspecific interstitial pneumonia (NSIP) favored.    Labs: Hypersensitivity panel 05/24/2018-negative CTD serologies-ANA negative, CCP less than 16, double-stranded DNA, Ro, La, SCL 70- Rheumatoid factor 14  Aldolase 5.3 CK 3 1 ANCA-negative   CBC Latest Ref Rng & Units 02/24/2020 08/20/2019 02/25/2019  WBC 4.0 - 10.5 K/uL 8.3 7.6 8.6  Hemoglobin 13.0 - 17.0 g/dL 14.2 12.9(L) 14.0  Hematocrit 39.0 - 52.0 % 42.1 37.3(L) 40.6  Platelets 150.0 - 400.0 K/uL 193.0 192.0 200    BMP Latest Ref Rng & Units 03/05/2020 02/25/2019 11/20/2018  Glucose 70 - 99 mg/dL 95 94 96  BUN 6 - 23 mg/dL 22 16 17   Creatinine 0.40 - 1.50 mg/dL 1.03 0.97 1.15  BUN/Creat Ratio 10 - 24 - - -  Sodium 135 - 145 mEq/L 138 138 140  Potassium 3.5 - 5.1 mEq/L 4.8 4.8 4.3  Chloride 96 - 112 mEq/L 104 104 106  CO2 19 - 32 mEq/L 28 24 26   Calcium 8.4 - 10.5 mg/dL 9.4 9.3 9.3    BNP    Component Value Date/Time   BNP 128.1 (H) 02/14/2016 0753    ProBNP    Component Value Date/Time   PROBNP 265.0 (H) 06/08/2017 0927    PFT    Component Value Date/Time   FEV1PRE 2.53 11/26/2018 1511   FEV1POST 2.63 04/18/2018 1546   FVCPRE 3.37 11/26/2018 1511   FVCPOST 3.43 04/18/2018 1546   TLC 6.37 04/18/2018 1546   DLCOUNC 17.35 11/26/2018 1511   PREFEV1FVCRT 75 11/26/2018 1511   PSTFEV1FVCRT  77 04/18/2018 1546    No results found.   Past medical hx Past Medical History:  Diagnosis Date  . Abnormal CT scan, stomach 02/2014   Thickening of gastric fundus and cardia.  gastritis on EGD 03/2014  . Allergic rhinitis due to pollen   . Arthropathy, unspecified, site unspecified   . Atherosclerosis    a. Noted by abdominal CT 02/2014 (h/o normal nuc 2011).  . Atrial flutter ([place]) 02/24/14   s/p ablation 11/15  . Cardiomyopathy ([place])    tachy mediated - a. TEE (10/15):  EF 30%;  b. Echo after NSR restored (10/15):  mild LVH, EF 55-60%, mild AS, mild AI, mild MR, mild to mod LAE, mild RAE  . Diverticulosis    severe in descending, sigmoid colon.   . ED (erectile dysfunction)   . Esophageal stricture 03/2014   traversable with endoscope. not dilated.   . Gastric AVM   . GERD (gastroesophageal reflux disease) 03/2014   small HH and gastritis on EGD  . GI bleed   . Habitual alcohol use   . Hemorrhoids   . Hiatal hernia   . Hyperlipidemia   . Hypertension   . Obesity   . OSA on CPAP   . PAF (paroxysmal atrial fibrillation) ([place])   . Prostatitis, unspecified   . 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 Skin cancer    basal and squamous cell     Social History   Tobacco Use  . Smoking status: Former Smoker    Packs/day: 0.00    Years: 40.00    Pack years: 0.00    Types: Cigarettes, Cigars    Quit date: 09/18/2008    Years since quitting: 11.6  . Smokeless tobacco: Former Systems developer    Types: Chew  . Tobacco comment: Has Occasional Cigar/ quit   Vaping Use  . Vaping Use: Never used  Substance Use Topics  . Alcohol use: Yes    Alcohol/week: 28.0 standard drinks    Types: 21 Glasses of wine, 7 Shots of liquor per week    Comment: states he does want to drink less and cut back on the amount.   . Drug use: No    Mr.Donald [name] reports that he quit smoking about 11 years ago. His smoking use included cigarettes and cigars. He smoked 0.00 packs per day for 40.00  years. He has quit using smokeless tobacco.  His smokeless tobacco use included chew. He reports current alcohol use of about 28.0 standard drinks of alcohol per week. He reports that he does not use drugs.  Tobacco Cessation: Former smoker with a 40 pack year smoking history, quit 2000.  Past surgical hx, Family hx, Social hx all reviewed.  Current Outpatient Medications on File Prior to Visit  Medication Sig  .  atorvastatin (LIPITOR) 40 MG tablet Take 1 tablet (40 mg total) by mouth daily.  Marland Kitchen diltiazem (CARDIZEM CD) 120 MG 24 hr capsule TAKE (1) CAPSULE TWICE DAILY.  Marland Kitchen dofetilide (TIKOSYN) 500 MCG capsule TAKE ONE CAPSULE BY MOUTH TWO TIMES A DAY  . ELIQUIS 5 MG TABS tablet TAKE 1 TABLET BY MOUTH TWICE DAILY.  . ferrous sulfate 325 (65 FE) MG tablet Take 1 tablet (325 mg total) by mouth daily with breakfast.  . FIBER PO Take by mouth.  . furosemide (LASIX) 20 MG tablet TAKE 1 TABLET EACH DAY.  . isosorbide mononitrate (IMDUR) 30 MG 24 hr tablet TAKE 1 TABLET BY MOUTH DAILY.  . Magnesium 65 MG TABS Take 7 tablets by mouth at bedtime.   . metoprolol tartrate (LOPRESSOR) 25 MG tablet TAKE 1 TABLET BY MOUTH TWICE DAILY.  . metroNIDAZOLE (METROGEL) 0.75 % gel Apply 1 application topically daily as needed (for rosacea).   . Multiple Vitamins-Minerals (EYE VITAMINS) CAPS Take 1 capsule by mouth 2 (two) times daily.  . Multiple Vitamins-Minerals (MULTIVITAMIN PO) Take 1 tablet by mouth 2 (two) times daily.  . nitroGLYCERIN (NITROSTAT) 0.4 MG SL tablet PLACE 1 TABLET UNDER THE TONGUE EVERY 5 MINUTES AS NEEDED FOR CHEST PAIN, MAY REPEAT FOR 3 DOSES.  Marland Kitchen pantoprazole (PROTONIX) 40 MG tablet TAKE 1 TABLET ONCE DAILY.  . Probiotic Product (ALIGN PO) Take by mouth.   No current facility-administered medications on file prior to visit.     No Known Allergies  Review Of Systems:  Constitutional:   No  weight loss, night sweats,  Fevers, chills, fatigue, or  lassitude.  HEENT:   No headaches,   Difficulty swallowing,  Tooth/dental problems, or  Sore throat,                No sneezing, itching, ear ache, nasal congestion, post nasal drip,   CV:  No chest pain,  Orthopnea, PND, swelling in lower extremities, anasarca, dizziness, palpitations, syncope.   GI  No heartburn, indigestion, abdominal pain, nausea, vomiting, diarrhea, change in bowel habits, loss of appetite, bloody stools.   Resp: No shortness of breath with exertion or at rest.  No excess mucus, no productive cough,  No non-productive cough,  No coughing up of blood.  No change in color of mucus.  No wheezing.  No chest wall deformity  Skin: no rash or lesions.  GU: no dysuria, change in color of urine, no urgency or frequency.  No flank pain, no hematuria   MS:  No joint pain or swelling.  No decreased range of motion.  No back pain.  Psych:  No change in mood or affect. No depression or anxiety.  No memory loss.   Vital Signs BP 118/64 (BP Location: Left Arm, Cuff Size: Normal)   Pulse 61   Temp 97.8 F (36.6 C) (Oral)   Ht 5\' 9"  (1.753 m)   Wt 223 lb 12.8 oz (101.5 kg)   SpO2 97%   BMI 33.05 kg/m    Physical Exam:  General- No distress,  A&Ox3, pleasant ENT: No sinus tenderness, TM clear, pale nasal mucosa, no oral exudate,no post nasal drip, no LAN Cardiac: S1, S2, regular rate and rhythm, no murmur Chest: No wheeze/ rales/ dullness; no accessory muscle use, no nasal flaring, no sternal retractions Abd.: Soft Non-tender, ND, BS +, Body mass index is 33.05 kg/m. Ext: No clubbing cyanosis, edema Neuro:  normal strength, MAE x 4, A&O x 3 Skin: No rashes, No lesions, warm  and dry Psych: normal mood and behavior   Assessment/Plan OSA on CPAP Excellent Compliance AHI of 3.8 Needs new Machine Plan We will order a new CPAP machine today ( Yours is 86 years old.) Congratulations on your great down Load and compliance  You are benefiting from CPAP therapy as you AHI is well controlled at 3.8 Continue  on CPAP at bedtime. You appear to be benefiting from the treatment  Goal is to wear for at least 6 hours each night for maximal clinical benefit. Continue to work on weight loss, as the link between excess weight  and sleep apnea is well established.   Remember to establish a good bedtime routine, and work on sleep hygiene.  Limit daytime naps , avoid stimulants such as caffeine and nicotine close to bedtime, exercise daily to promote sleep quality, avoid heavy , spicy, fried , or rich foods before bed. Ensure adequate exposure to natural light during the day,establish a relaxing bedtime routine with a pleasant sleep environment ( Bedroom between 60 and 67 degrees, turn off bright lights , TV or device screens screens , consider black out curtains or white noise machines) Do not drive if sleepy. Remember to clean mask, tubing, filter, and reservoir once weekly with soapy water.  Follow up with Dr. Elsworth [name] in 1 year or sooner if you need [place] for your OSA.    ILD Needs annual progression work up Will consider BAL with envisia classifier if needed Plan We will order an HRCT today. We will order PFT's today. We will order 6 minute walk. You will get a call to schedule these. Follow up with Dr. Chase [name] or Dr. Vaughan [name]   In 1 month, after above testing is completed, or before as needed.  Call if you need [place] for anything sooner.  This appointment was over 30 minutes long with over 50% of the time being direct face to face patient care, assessment , plan of care , and follow up,   Magdalen Spatz, NP 04/27/2020  4:30 PM

## 2020-04-27 NOTE — Patient Instructions (Addendum)
It is great to see you today. We will order an HRCT today. We will order PFT's today. We will order 6 minute walk. You will get a call to schedule these. We will order a new CPAP machine today ( Yours is 84 years old.) DME is Camera operator on your great down Load and compliance  You are benefiting from CPAP therapy as you AHI is well controlled at 3.8 Continue on CPAP at bedtime. You appear to be benefiting from the treatment  Goal is to wear for at least 6 hours each night for maximal clinical benefit. Continue to work on weight loss, as the link between excess weight  and sleep apnea is well established.   Remember to establish a good bedtime routine, and work on sleep hygiene.  Limit daytime naps , avoid stimulants such as caffeine and nicotine close to bedtime, exercise daily to promote sleep quality, avoid heavy , spicy, fried , or rich foods before bed. Ensure adequate exposure to natural light during the day,establish a relaxing bedtime routine with a pleasant sleep environment ( Bedroom between 60 and 67 degrees, turn off bright lights , TV or device screens screens , consider black out curtains or white noise machines) Do not drive if sleepy. Remember to clean mask, tubing, filter, and reservoir once weekly with soapy water.  Follow up with Dr. Chase Caller or Dr. Vaughan Browner   In 1 month or before as needed.  Follow up with Dr. Elsworth Soho in 1 year or sooner if you need [place] for your OSA.   Happy Holidays

## 2020-04-28 ENCOUNTER — Ambulatory Visit (INDEPENDENT_AMBULATORY_CARE_PROVIDER_SITE_OTHER): Payer: PPO

## 2020-04-28 VITALS — BP 120/72 | HR 70 | Temp 98.3°F | Ht 69.0 in | Wt 226.2 lb

## 2020-04-28 DIAGNOSIS — Z0000 Encounter for general adult medical examination without abnormal findings: Secondary | ICD-10-CM | POA: Diagnosis not present

## 2020-04-28 NOTE — Patient Instructions (Addendum)
Donald [name] , Thank you for taking time to come for your Medicare Wellness Visit. I appreciate your ongoing commitment to your health goals. Please review the following plan we discussed and let me know if I can assist you in the future.   Screening recommendations/referrals: Colonoscopy: 12/16/2016; no repeat due to age Recommended yearly ophthalmology/optometry visit for glaucoma screening and checkup Recommended yearly dental visit for hygiene and checkup  Vaccinations: Influenza vaccine: 02/2020 Pneumococcal vaccine: up to date Tdap vaccine: 04/16/2018; due every 10 years Shingles vaccine: up to date    Covid-19: up to date  Advanced directives: Documents on file  Conditions/risks identified: Yes; Reviewed health maintenance screenings with patient today and relevant education, vaccines, and/or referrals were provided. Please continue to do your personal lifestyle choices by: daily care of teeth and gums, regular physical activity (goal should be 5 days a week for 30 minutes), eat a healthy diet, avoid tobacco and drug use, limiting any alcohol intake, taking a low-dose aspirin (if not allergic or have been advised by your provider otherwise) and taking vitamins and minerals as recommended by your provider. Continue doing brain stimulating activities (puzzles, reading, adult coloring books, staying active) to keep memory sharp. Continue to eat heart healthy diet (full of fruits, vegetables, whole grains, lean protein, water--limit salt, fat, and sugar intake) and increase physical activity as tolerated.  Next appointment: Please schedule your next Medicare Wellness Visit with your Nurse Health Advisor in 1 year by calling [phone].  Preventive Care 20 Years and Older, Male Preventive care refers to lifestyle choices and visits with your health care provider that can promote health and wellness. What does preventive care include?  A yearly physical exam. This is also called an annual well  check.  Dental exams once or twice a year.  Routine eye exams. Ask your health care provider how often you should have your eyes checked.  Personal lifestyle choices, including:  Daily care of your teeth and gums.  Regular physical activity.  Eating a healthy diet.  Avoiding tobacco and drug use.  Limiting alcohol use.  Practicing safe sex.  Taking low doses of aspirin every day.  Taking vitamin and mineral supplements as recommended by your health care provider. What happens during an annual well check? The services and screenings done by your health care provider during your annual well check will depend on your age, overall health, lifestyle risk factors, and family history of disease. Counseling  Your health care provider may ask you questions about your:  Alcohol use.  Tobacco use.  Drug use.  Emotional well-being.  Home and relationship well-being.  Sexual activity.  Eating habits.  History of falls.  Memory and ability to understand (cognition).  Work and work Statistician. Screening  You may have the following tests or measurements:  Height, weight, and BMI.  Blood pressure.  Lipid and cholesterol levels. These may be checked every 5 years, or more frequently if you are over 29 years old.  Skin check.  Lung cancer screening. You may have this screening every year starting at age 78 if you have a 30-pack-year history of smoking and currently smoke or have quit within the past 15 years.  Fecal occult blood test (FOBT) of the stool. You may have this test every year starting at age 62.  Flexible sigmoidoscopy or colonoscopy. You may have a sigmoidoscopy every 5 years or a colonoscopy every 10 years starting at age 52.  Prostate cancer screening. Recommendations will vary depending on your  family history and other risks.  Hepatitis C blood test.  Hepatitis B blood test.  Sexually transmitted disease (STD) testing.  Diabetes screening. This is  done by checking your blood sugar (glucose) after you have not eaten for a while (fasting). You may have this done every 1-3 years.  Abdominal aortic aneurysm (AAA) screening. You may need this if you are a current or former smoker.  Osteoporosis. You may be screened starting at age 73 if you are at high risk. Talk with your health care provider about your test results, treatment options, and if necessary, the need for more tests. Vaccines  Your health care provider may recommend certain vaccines, such as:  Influenza vaccine. This is recommended every year.  Tetanus, diphtheria, and acellular pertussis (Tdap, Td) vaccine. You may need a Td booster every 10 years.  Zoster vaccine. You may need this after age 73.  Pneumococcal 13-valent conjugate (PCV13) vaccine. One dose is recommended after age 39.  Pneumococcal polysaccharide (PPSV23) vaccine. One dose is recommended after age 110. Talk to your health care provider about which screenings and vaccines you need and how often you need them. This information is not intended to replace advice given to you by your health care provider. Make sure you discuss any questions you have with your health care provider. Document Released: 05/29/2015 Document Revised: 01/20/2016 Document Reviewed: 03/03/2015 Elsevier Interactive Patient Education  2017 [place] Prevention in the Home Falls can cause injuries. They can happen to people of all ages. There are many things you can do to make your home safe and to help prevent falls. What can I do on the outside of my home?  Regularly fix the edges of walkways and driveways and fix any cracks.  Remove anything that might make you trip as you walk through a door, such as a raised step or threshold.  Trim any bushes or trees on the path to your home.  Use bright outdoor lighting.  Clear any walking paths of anything that might make someone trip, such as rocks or tools.  Regularly check to  see if handrails are loose or broken. Make sure that both sides of any steps have handrails.  Any raised decks and porches should have guardrails on the edges.  Have any leaves, snow, or ice cleared regularly.  Use sand or salt on walking paths during winter.  Clean up any spills in your garage right away. This includes oil or grease spills. What can I do in the bathroom?  Use night lights.  Install grab bars by the toilet and in the tub and shower. Do not use towel bars as grab bars.  Use non-skid mats or decals in the tub or shower.  If you need to sit down in the shower, use a plastic, non-slip stool.  Keep the floor dry. Clean up any water that spills on the floor as soon as it happens.  Remove soap buildup in the tub or shower regularly.  Attach bath mats securely with double-sided non-slip rug tape.  Do not have throw rugs and other things on the floor that can make you trip. What can I do in the bedroom?  Use night lights.  Make sure that you have a light by your bed that is easy to reach.  Do not use any sheets or blankets that are too big for your bed. They should not hang down onto the floor.  Have a firm chair that has side arms. You can  use this for support while you get dressed.  Do not have throw rugs and other things on the floor that can make you trip. What can I do in the kitchen?  Clean up any spills right away.  Avoid walking on wet floors.  Keep items that you use a lot in easy-to-reach places.  If you need to reach something above you, use a strong step stool that has a grab bar.  Keep electrical cords out of the way.  Do not use floor polish or wax that makes floors slippery. If you must use wax, use non-skid floor wax.  Do not have throw rugs and other things on the floor that can make you trip. What can I do with my stairs?  Do not leave any items on the stairs.  Make sure that there are handrails on both sides of the stairs and use  them. Fix handrails that are broken or loose. Make sure that handrails are as long as the stairways.  Check any carpeting to make sure that it is firmly attached to the stairs. Fix any carpet that is loose or worn.  Avoid having throw rugs at the top or bottom of the stairs. If you do have throw rugs, attach them to the floor with carpet tape.  Make sure that you have a light switch at the top of the stairs and the bottom of the stairs. If you do not have them, ask someone to add them for you. What else can I do to help prevent falls?  Wear shoes that:  Do not have high heels.  Have rubber bottoms.  Are comfortable and fit you well.  Are closed at the toe. Do not wear sandals.  If you use a stepladder:  Make sure that it is fully opened. Do not climb a closed stepladder.  Make sure that both sides of the stepladder are locked into place.  Ask someone to hold it for you, if possible.  Clearly mark and make sure that you can see:  Any grab bars or handrails.  First and last steps.  Where the edge of each step is.  Use tools that help you move around (mobility aids) if they are needed. These include:  Canes.  Walkers.  Scooters.  Crutches.  Turn on the lights when you go into a dark area. Replace any light bulbs as soon as they burn out.  Set up your furniture so you have a clear path. Avoid moving your furniture around.  If any of your floors are uneven, fix them.  If there are any pets around you, be aware of where they are.  Review your medicines with your doctor. Some medicines can make you feel dizzy. This can increase your chance of falling. Ask your doctor what other things that you can do to help prevent falls. This information is not intended to replace advice given to you by your health care provider. Make sure you discuss any questions you have with your health care provider. Document Released: 02/26/2009 Document Revised: 10/08/2015 Document Reviewed:  06/06/2014 Elsevier Interactive Patient Education  2017 [employer].

## 2020-04-28 NOTE — Progress Notes (Signed)
Subjective:   Donald [name] is a 86 y.o. male who presents for Medicare Annual/Subsequent preventive examination.  Review of Systems    No ROS. Medicare Wellness Visit. Additional risk factors are reflected in social history. Cardiac Risk Factors include: advanced age (>77men, >26 women);dyslipidemia;hypertension;male gender;obesity (BMI >30kg/m2)     Objective:    Today's Vitals   04/28/20 1233  BP: 120/72  Pulse: 70  Temp: 98.3 F (36.8 C)  SpO2: 95%  Weight: 226 lb 3.2 oz (102.6 kg)  Height: 5\' 9"  (1.753 m)  PainSc: 0-No pain   Body mass index is 33.4 kg/m.  Advanced Directives 04/28/2020 03/18/2019 02/27/2018 01/22/2018 06/08/2017 06/08/2017 03/16/2017  Does Patient Have a Medical Advance Directive? Yes Yes Yes Yes - Yes Yes  Type of Advance Directive - [place];Living will [place];Living will [place];Living will Healthcare Power of [place];Living will [place];Living will  Does patient want to make changes to medical advance directive? No - Patient declined - - - - - No - Patient declined  Copy of [place] in Chart? - No - copy requested No - copy requested No - copy requested No - copy requested - Yes  Would patient like information on creating a medical advance directive? - - - - - - -    Current Medications (verified) Outpatient Encounter Medications as of 04/28/2020  Medication Sig  . atorvastatin (LIPITOR) 40 MG tablet Take 1 tablet (40 mg total) by mouth daily.  Marland Kitchen diltiazem (CARDIZEM CD) 120 MG 24 hr capsule TAKE (1) CAPSULE TWICE DAILY.  Marland Kitchen dofetilide (TIKOSYN) 500 MCG capsule TAKE ONE CAPSULE BY MOUTH TWO TIMES A DAY  . ELIQUIS 5 MG TABS tablet TAKE 1 TABLET BY MOUTH TWICE DAILY.  . ferrous sulfate 325 (65 FE) MG tablet Take 1 tablet (325 mg total) by mouth daily with breakfast.  . FIBER PO Take by mouth.  . furosemide (LASIX) 20 MG  tablet TAKE 1 TABLET EACH DAY.  . isosorbide mononitrate (IMDUR) 30 MG 24 hr tablet TAKE 1 TABLET BY MOUTH DAILY.  . Magnesium 65 MG TABS Take 7 tablets by mouth at bedtime.   . metoprolol tartrate (LOPRESSOR) 25 MG tablet TAKE 1 TABLET BY MOUTH TWICE DAILY.  . metroNIDAZOLE (METROGEL) 0.75 % gel Apply 1 application topically daily as needed (for rosacea).   . Multiple Vitamins-Minerals (EYE VITAMINS) CAPS Take 1 capsule by mouth 2 (two) times daily.  . Multiple Vitamins-Minerals (MULTIVITAMIN PO) Take 1 tablet by mouth 2 (two) times daily.  . nitroGLYCERIN (NITROSTAT) 0.4 MG SL tablet PLACE 1 TABLET UNDER THE TONGUE EVERY 5 MINUTES AS NEEDED FOR CHEST PAIN, MAY REPEAT FOR 3 DOSES.  Marland Kitchen pantoprazole (PROTONIX) 40 MG tablet TAKE 1 TABLET ONCE DAILY.  . Probiotic Product (ALIGN PO) Take by mouth.   No facility-administered encounter medications on file as of 04/28/2020.    Allergies (verified) Patient has no known allergies.   History: Past Medical History:  Diagnosis Date  . Abnormal CT scan, stomach 02/2014   Thickening of gastric fundus and cardia.  gastritis on EGD 03/2014  . Allergic rhinitis due to pollen   . Arthropathy, unspecified, site unspecified   . Atherosclerosis    a. Noted by abdominal CT 02/2014 (h/o normal nuc 2011).  . Atrial flutter ([place]) 02/24/14   s/p ablation 11/15  . Cardiomyopathy ([place])    tachy mediated - a. TEE (10/15):  EF 30%;  b. Echo after NSR restored (10/15):  mild LVH, EF 55-60%, mild AS, mild AI, mild MR, mild to mod LAE, mild RAE  . Diverticulosis    severe in descending, sigmoid colon.   . ED (erectile dysfunction)   . Esophageal stricture 03/2014   traversable with endoscope. not dilated.   . Gastric AVM   . GERD (gastroesophageal reflux disease) 03/2014   small HH and gastritis on EGD  . GI bleed   . Habitual alcohol use   . Hemorrhoids   . Hiatal hernia   . Hyperlipidemia   . Hypertension   . Obesity   . OSA on CPAP   . PAF (paroxysmal  atrial fibrillation) ([place])   . Prostatitis, unspecified   . 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 Skin cancer    basal and squamous cell   Past Surgical History:  Procedure Laterality Date  . ATRIAL FLUTTER ABLATION N/A 03/19/2014   RFCA of atrial flutter by Dr Caryl Comes  . CARDIOVERSION N/A 02/24/2014   Procedure: CARDIOVERSION;  Surgeon: Lelon Perla, MD;  Location: [facility];  Service: Cardiovascular;  Laterality: N/A;  . CARDIOVERSION Right 09/10/2014   Procedure: CARDIOVERSION;  Surgeon: Deboraha Sprang, MD;  Location[facility] CATH LAB;  Service: Cardiovascular;  Laterality: Right;  . CARDIOVERSION N/A 01/22/2018   Procedure: CARDIOVERSION;  Surgeon: Fay Records, MD;  Location: [facility] ENDOSCOPY;  Service: Cardiovascular;  Laterality: N/A;  . COLONOSCOPY N/A 04/23/2015   Procedure: COLONOSCOPY;  Surgeon: Ladene Artist, MD;  Location: [facility] ENDOSCOPY;  Service: Endoscopy;  Laterality: N/A;  . ENTEROSCOPY N/A 06/12/2015   Procedure: ENTEROSCOPY;  Surgeon: Ladene Artist, MD;  Location: [facility] ENDOSCOPY;  Service: Endoscopy;  Laterality: N/A;  . EP IMPLANTABLE DEVICE N/A 02/15/2016   Procedure: Pacemaker Implant;  Surgeon: Evans Lance, MD;  Location: [facility] CV LAB;  Service: Cardiovascular;  Laterality: N/A;  . ESOPHAGOGASTRODUODENOSCOPY N/A 04/09/2014   Procedure: ESOPHAGOGASTRODUODENOSCOPY (EGD);  Surgeon: Inda Castle, MD;  Location: [facility];  Service: Endoscopy;  Laterality: N/A;  . ESOPHAGOGASTRODUODENOSCOPY N/A 04/23/2015   Procedure: ESOPHAGOGASTRODUODENOSCOPY (EGD);  Surgeon: Ladene Artist, MD;  Location: [facility] ENDOSCOPY;  Service: Endoscopy;  Laterality: N/A;  . INGUINAL HERNIA REPAIR     right  . INGUINAL HERNIA REPAIR     left  . LEFT HEART CATH AND CORONARY ANGIOGRAPHY N/A 03/16/2017   Procedure: LEFT HEART CATH AND CORONARY ANGIOGRAPHY;  Surgeon: Burnell Blanks, MD;  Location: [facility] CV LAB;  Service: Cardiovascular;  Laterality: N/A;  . ORIF  fracture of the elbow    . SKIN CANCER EXCISION  05/21/12   Squamous cell ca  . TEE WITHOUT CARDIOVERSION N/A 02/24/2014   Procedure: TRANSESOPHAGEAL ECHOCARDIOGRAM (TEE);  Surgeon: Lelon Perla, MD;  Location: [facility] ENDOSCOPY;  Service: Cardiovascular;  Laterality: N/A;  . TONSILLECTOMY     Family History  Problem Relation Age of Onset  . Cancer Mother        ovarian  . Other Father        renal disease- grief over loss of spouse  . Cancer Brother        prostate- died of hematologic disorder 2nd to chemo  . Pulmonary fibrosis Brother   . Diabetes Neg Hx   . Coronary artery disease Neg Hx   . Colon cancer Neg Hx   . Stomach cancer Neg Hx   . Rectal cancer Neg Hx  Social History   Socioeconomic History  . Marital status: Married    Spouse name: Not on file  . Number of children: 3  . Years of education: 60  . Highest education level: Not on file  Occupational History  . Occupation: Therapist, sports: RETIRED  Tobacco Use  . Smoking status: Former Smoker    Packs/day: 0.00    Years: 40.00    Pack years: 0.00    Types: Cigarettes, Cigars    Quit date: 09/18/2008    Years since quitting: 11.6  . Smokeless tobacco: Former Systems developer    Types: Chew  . Tobacco comment: Has Occasional Cigar/ quit   Vaping Use  . Vaping Use: Never used  Substance and Sexual Activity  . Alcohol use: Yes    Alcohol/week: 28.0 standard drinks    Types: 21 Glasses of wine, 7 Shots of liquor per week    Comment: states he does want to drink less and cut back on the amount.   . Drug use: No  . Sexual activity: Not Currently  Other Topics Concern  . 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 married 1961. 2 sons- '64, '63 , I daughter- '66, 4 grandchildren. work: Museum/gallery curator, retired but still consults. 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      No new  stressors/    Social Determinants of Health   Financial Resource Strain: Low Risk   . Difficulty of Paying Living Expenses: Not hard at all  Food Insecurity: No Food Insecurity  . Worried About Charity fundraiser in the Last Year: Never true  . Ran Out of Food in the Last Year: Never true  Transportation Needs: No Transportation Needs  . Lack of Transportation (Medical): No  . Lack of Transportation (Non-Medical): No  Physical Activity: Sufficiently Active  . Days of Exercise per Week: 5 days  . Minutes of Exercise per Session: 30 min  Stress: No Stress Concern Present  . Feeling of Stress : Not at all  Social Connections: Socially Integrated  . Frequency of Communication with Friends and Family: More than three times a week  . Frequency of Social Gatherings with Friends and Family: More than three times a week  . Attends Religious Services: More than 4 times per year  . Active Member of Clubs or Organizations: Yes  . Attends Archivist Meetings: More than 4 times per year  . Marital Status: Married    Tobacco Counseling Counseling given: Not Answered Comment: Has Occasional Cigar/ quit    Clinical Intake:  Pre-visit preparation completed: Yes  Pain : No/denies pain Pain Score: 0-No pain     BMI - recorded: 33.4 Nutritional Status: BMI > 30  Obese Nutritional Risks: None Diabetes: No  How often do you need to have someone help you when you read instructions, pamphlets, or other written materials from your doctor or pharmacy?: 1 - Never What is the last grade level you completed in school?: Bachelor's Degree  Diabetic? no  Interpreter Needed?: No  Information entered by :: Lisette Abu, LPN   Activities of Daily Living In your present state of health, do you have any difficulty performing the following activities: 04/28/2020  Hearing? N  Vision? N  Difficulty concentrating or making decisions? N  Walking or climbing stairs? N  Dressing or  bathing? N  Doing  errands, shopping? N  Preparing Food and eating ? N  Using the Toilet? N  In the past six months, have you accidently leaked urine? N  Do you have problems with loss of bowel control? N  Managing your Medications? N  Managing your Finances? N  Housekeeping or managing your Housekeeping? N  Some recent data might be hidden    Patient Care Team: Janith Lima, MD as PCP - General (Internal Medicine) Deboraha Sprang, MD as PCP - Cardiology (Cardiology) Franchot Gallo, MD (Urology) [name], [name], MD (Dermatology) Monna Fam, MD as Consulting Physician (Ophthalmology) Deboraha Sprang, MD as Consulting Physician (Cardiology) Pyrtle, Lajuan Lines, MD as Consulting Physician (Gastroenterology) Brand Males, MD as Consulting Physician (Pulmonary Disease)  Indicate any recent Medical Services you may have received from other than [facility] providers in the past year (date may be approximate).     Assessment:   This is a routine wellness examination for [place].  Hearing/Vision screen No exam data present  Dietary issues and exercise activities discussed: Current Exercise Habits: Home exercise routine, Type of exercise: walking;Other - see comments (water areobics), Time (Minutes): 30, Frequency (Times/Week): 5, Weekly Exercise (Minutes/Week): 150, Intensity: Mild, Exercise limited by: respiratory conditions(s);cardiac condition(s)  Goals    . lose weight     Continue to increase the amount of physical activity, eat healthy, enjoy life and family.     . Patient Stated     I want to lose weight by using portion control, watching what I do eat, and cut back on the amount of wine I drink. Continue to stay active and exercise.      Depression Screen PHQ 2/9 Scores 04/28/2020 03/18/2019 11/23/2018 02/27/2018 02/20/2017 08/09/2016 09/15/2014  PHQ - 2 Score 0 0 0 0 0 0 0  PHQ- 9 Score - - - - 0 - -    Fall Risk Fall Risk  04/28/2020 03/18/2019 11/23/2018 02/27/2018  02/20/2017  Falls in the past year? 0 0 0 No No  Number falls in past yr: 0 0 0 - -  Injury with Fall? 0 0 0 - -  Risk for fall due to : No Fall Risks - - - -  Follow up Falls evaluation completed;Education provided - Falls evaluation completed - -    FALL RISK PREVENTION PERTAINING TO THE HOME:  Any stairs in or around the home? No  If so, are there any without handrails? No  Home free of loose throw rugs in walkways, pet beds, electrical cords, etc? Yes  Adequate lighting in your home to reduce risk of falls? Yes   ASSISTIVE DEVICES UTILIZED TO PREVENT FALLS:  Life alert? Yes  Use of a cane, walker or w/c? No  Grab bars in the bathroom? Yes  Shower chair or bench in shower? Yes  Elevated toilet seat or a handicapped toilet? Yes   TIMED UP AND GO:  Was the test performed? No .  Length of time to ambulate 10 feet: 0 sec.   Gait steady and fast without use of assistive device  Cognitive Function: Normal cognitive status assessed by direct observation by this Nurse Health Advisor. No abnormalities found.   MMSE - Mini Mental State Exam 02/27/2018 02/20/2017 09/15/2014  Not completed: - - Unable to complete  Orientation to time 5 5 -  Orientation to Place 5 5 -  Registration 3 3 -  Attention/ Calculation 5 5 -  Recall 2 2 -  Language- name 2 objects 2 2 -  Language- repeat 1 1 -  Language- follow 3 step command 3 3 -  Language- read & follow direction 1 1 -  Write a sentence 1 1 -  Copy design 1 1 -  Total score 29 29 -        Immunizations Immunization History  Administered Date(s) Administered  . Fluad Quad(high Dose 65+) 01/19/2019  . H1N1 04/28/2008  . Influenza Split 02/23/2011, 02/07/2012  . Influenza Whole 02/07/2008, 02/04/2009  . Influenza, High Dose Seasonal PF 01/20/2015, 03/07/2016, 02/20/2017, 02/20/2018  . Influenza,inj,Quad PF,6+ Mos 02/07/2013, 02/03/2014  . Influenza-Unspecified 02/20/2020  . Moderna Sars-Covid-2 Vaccination 05/28/2019,  06/26/2019, 03/27/2020  . Pneumococcal Conjugate-13 05/07/2013  . Pneumococcal Polysaccharide-23 03/20/2007, 03/07/2016  . Td 04/11/2008  . Tdap 04/16/2018  . Zoster 01/27/2010  . Zoster Recombinat (Shingrix) 02/27/2018, 04/30/2018    TDAP status: Up to date  Flu Vaccine status: Up to date  Pneumococcal vaccine status: Up to date  Covid-19 vaccine status: Completed vaccines  Qualifies for Shingles Vaccine? Yes   Zostavax completed Yes   Shingrix Completed?: Yes  Screening Tests Health Maintenance  Topic Date Due  . TETANUS/TDAP  04/16/2028  . INFLUENZA VACCINE  Completed  . COVID-19 Vaccine  Completed  . PNA vac Low Risk Adult  Completed    Health Maintenance  There are no preventive care reminders to display for this patient.  Colorectal cancer screening: Type of screening: CT Colonography. Completed 12/16/2016. Repeat every 0 years  Lung Cancer Screening: (Low Dose CT Chest recommended if Age 52-80 years, 30 pack-year currently smoking OR have quit w/in 15years.) does qualify.   Lung Cancer Screening Referral: no, handled by pulmonology  Additional Screening:  Hepatitis C Screening: does not qualify; Completed no  Vision Screening: Recommended annual ophthalmology exams for early detection of glaucoma and other disorders of the eye. Is the patient up to date with their annual eye exam?  Yes  Who is the provider or what is the name of the office in which the patient attends annual eye exams? Kandy Garrison, MD If pt is not established with a provider, would they like to be referred to a provider to establish care? No .   Dental Screening: Recommended annual dental exams for proper oral hygiene  Community Resource Referral / Chronic Care Management: CRR required this visit?  No   CCM required this visit?  No      Plan:     I have personally reviewed and noted the following in the patient's chart:   . Medical and social history . Use of alcohol, tobacco or  illicit drugs  . Current medications and supplements . Functional ability and status . Nutritional status . Physical activity . Advanced directives . List of other physicians . Hospitalizations, surgeries, and ER visits in previous 12 months . Vitals . Screenings to include cognitive, depression, and falls . Referrals and appointments  In addition, I have reviewed and discussed with patient certain preventive protocols, quality metrics, and best practice recommendations. A written personalized care plan for preventive services as well as general preventive health recommendations were provided to patient.     Sheral Flow, LPN   13/12/6576   Nurse Notes: n/a

## 2020-04-30 ENCOUNTER — Other Ambulatory Visit: Payer: Self-pay

## 2020-04-30 ENCOUNTER — Ambulatory Visit: Payer: PPO | Admitting: Acute Care

## 2020-04-30 DIAGNOSIS — J849 Interstitial pulmonary disease, unspecified: Secondary | ICD-10-CM

## 2020-04-30 NOTE — Progress Notes (Signed)
Six Minute Walk - 04/30/20 1000      Six Minute Walk   Medications taken before test (dose and time) Eliquis 5mg , Lipitor 40mg , Cardizem 120mg , Tikosyn 500 mcg, Iron 325mg , Fiber supplement otc, Lasix 20mg , Imdur 30mg , magnesium 65mg , MVI, Preservision, Protonix 40mg - all taken approx 0725.    Supplemental oxygen during test? No    Lap distance in meters  34 meters    Laps Completed 14    Partial lap (in meters) 12 meters    Baseline BP (sitting) 128/66    Baseline Heartrate 63    Baseline Dyspnea (Borg Scale) 2    Baseline Fatigue (Borg Scale) 0    Baseline SPO2 97 %      End of Test Values    BP (sitting) 138/72    Heartrate 79    Dyspnea (Borg Scale) 5    Fatigue (Borg Scale) 1    SPO2 95 %      2 Minutes Post Walk Values   BP (sitting) 132/68    Heartrate 60    SPO2 98 %    Stopped or paused before six minutes? No      Interpretation   Distance completed 488 meters    Tech Comments: Patient tolerated walk well.

## 2020-05-03 ENCOUNTER — Other Ambulatory Visit: Payer: Self-pay | Admitting: Internal Medicine

## 2020-05-06 ENCOUNTER — Ambulatory Visit (INDEPENDENT_AMBULATORY_CARE_PROVIDER_SITE_OTHER)
Admission: RE | Admit: 2020-05-06 | Discharge: 2020-05-06 | Disposition: A | Discharge status: Home / Self Care | Payer: PPO | Source: Ambulatory Visit | Attending: Acute Care | Admitting: Acute Care

## 2020-05-06 ENCOUNTER — Other Ambulatory Visit: Payer: Self-pay

## 2020-05-06 DIAGNOSIS — J849 Interstitial pulmonary disease, unspecified: Secondary | ICD-10-CM

## 2020-05-06 DIAGNOSIS — J84112 Idiopathic pulmonary fibrosis: Secondary | ICD-10-CM | POA: Diagnosis not present

## 2020-05-06 DIAGNOSIS — J432 Centrilobular emphysema: Secondary | ICD-10-CM | POA: Diagnosis not present

## 2020-05-06 DIAGNOSIS — I2510 Atherosclerotic heart disease of native coronary artery without angina pectoris: Secondary | ICD-10-CM | POA: Diagnosis not present

## 2020-05-06 DIAGNOSIS — I358 Other nonrheumatic aortic valve disorders: Secondary | ICD-10-CM | POA: Diagnosis not present

## 2020-05-07 ENCOUNTER — Encounter: Payer: Self-pay | Admitting: Internal Medicine

## 2020-05-14 DIAGNOSIS — H04123 Dry eye syndrome of bilateral lacrimal glands: Secondary | ICD-10-CM | POA: Diagnosis not present

## 2020-05-14 DIAGNOSIS — H353132 Nonexudative age-related macular degeneration, bilateral, intermediate dry stage: Secondary | ICD-10-CM | POA: Diagnosis not present

## 2020-05-14 DIAGNOSIS — H524 Presbyopia: Secondary | ICD-10-CM | POA: Diagnosis not present

## 2020-05-14 DIAGNOSIS — Z961 Presence of intraocular lens: Secondary | ICD-10-CM | POA: Diagnosis not present

## 2020-05-18 ENCOUNTER — Ambulatory Visit (INDEPENDENT_AMBULATORY_CARE_PROVIDER_SITE_OTHER): Payer: PPO | Admitting: Internal Medicine

## 2020-05-18 ENCOUNTER — Other Ambulatory Visit: Payer: Self-pay

## 2020-05-18 DIAGNOSIS — J849 Interstitial pulmonary disease, unspecified: Secondary | ICD-10-CM | POA: Diagnosis not present

## 2020-05-18 LAB — PULMONARY FUNCTION TEST
DL/VA % pred: 99 %
DL/VA: 3.83 ml/min/mmHg/L
DLCO cor % pred: 75 %
DLCO cor: 16.87 ml/min/mmHg
DLCO unc % pred: 75 %
DLCO unc: 16.87 ml/min/mmHg
FEF 25-75 Post: 2.55 L/sec
FEF 25-75 Pre: 1.58 L/sec
FEF2575-%Change-Post: 61 %
FEF2575-%Pred-Post: 165 %
FEF2575-%Pred-Pre: 102 %
FEV1-%Change-Post: 9 %
FEV1-%Pred-Post: 108 %
FEV1-%Pred-Pre: 98 %
FEV1-Post: 2.6 L
FEV1-Pre: 2.37 L
FEV1FVC-%Change-Post: 5 %
FEV1FVC-%Pred-Pre: 103 %
FEV6-%Change-Post: 5 %
FEV6-%Pred-Post: 105 %
FEV6-%Pred-Pre: 100 %
FEV6-Post: 3.38 L
FEV6-Pre: 3.22 L
FEV6FVC-%Change-Post: 0 %
FEV6FVC-%Pred-Post: 108 %
FEV6FVC-%Pred-Pre: 107 %
FVC-%Change-Post: 4 %
FVC-%Pred-Post: 97 %
FVC-%Pred-Pre: 93 %
FVC-Post: 3.39 L
FVC-Pre: 3.26 L
Post FEV1/FVC ratio: 77 %
Post FEV6/FVC ratio: 100 %
Pre FEV1/FVC ratio: 73 %
Pre FEV6/FVC Ratio: 99 %
RV % pred: 98 %
RV: 2.62 L
TLC % pred: 83 %
TLC: 5.54 L

## 2020-05-18 NOTE — Progress Notes (Signed)
PFT done today. 

## 2020-05-19 ENCOUNTER — Ambulatory Visit (INDEPENDENT_AMBULATORY_CARE_PROVIDER_SITE_OTHER): Payer: PPO

## 2020-05-19 DIAGNOSIS — I495 Sick sinus syndrome: Secondary | ICD-10-CM | POA: Diagnosis not present

## 2020-05-19 LAB — CUP PACEART REMOTE DEVICE CHECK
Battery Remaining Longevity: 116 mo
Battery Remaining Percentage: 95.5 %
Battery Voltage: 2.99 V
Brady Statistic AP VP Percent: 1 %
Brady Statistic AP VS Percent: 99 %
Brady Statistic AS VP Percent: 1 %
Brady Statistic AS VS Percent: 1 %
Brady Statistic RA Percent Paced: 99 %
Brady Statistic RV Percent Paced: 1 %
Date Time Interrogation Session: 20220104020023
Implantable Lead Implant Date: 20171002
Implantable Lead Implant Date: 20171002
Implantable Lead Location: 753859
Implantable Lead Location: 753860
Implantable Pulse Generator Implant Date: 20171002
Lead Channel Impedance Value: 390 Ohm
Lead Channel Impedance Value: 530 Ohm
Lead Channel Pacing Threshold Amplitude: 0.5 V
Lead Channel Pacing Threshold Amplitude: 0.75 V
Lead Channel Pacing Threshold Pulse Width: 0.5 ms
Lead Channel Pacing Threshold Pulse Width: 0.5 ms
Lead Channel Sensing Intrinsic Amplitude: 12 mV
Lead Channel Sensing Intrinsic Amplitude: 3.4 mV
Lead Channel Setting Pacing Amplitude: 1.5 V
Lead Channel Setting Pacing Amplitude: 2.5 V
Lead Channel Setting Pacing Pulse Width: 0.5 ms
Lead Channel Setting Sensing Sensitivity: 2 mV
Pulse Gen Model: 2272
Pulse Gen Serial Number: 7951215

## 2020-06-02 DIAGNOSIS — M5136 Other intervertebral disc degeneration, lumbar region: Secondary | ICD-10-CM | POA: Diagnosis not present

## 2020-06-02 DIAGNOSIS — I700 Atherosclerosis of aorta: Secondary | ICD-10-CM | POA: Diagnosis not present

## 2020-06-03 ENCOUNTER — Ambulatory Visit: Payer: PPO | Admitting: Pulmonary Disease

## 2020-06-03 ENCOUNTER — Other Ambulatory Visit: Payer: Self-pay

## 2020-06-03 NOTE — Progress Notes (Signed)
Remote pacemaker transmission.   

## 2020-06-04 ENCOUNTER — Encounter: Payer: Self-pay | Admitting: Pulmonary Disease

## 2020-06-04 ENCOUNTER — Ambulatory Visit (INDEPENDENT_AMBULATORY_CARE_PROVIDER_SITE_OTHER): Payer: PPO | Admitting: Pulmonary Disease

## 2020-06-04 VITALS — BP 118/68 | HR 68 | Temp 97.1°F | Ht 68.0 in | Wt 224.8 lb

## 2020-06-04 DIAGNOSIS — G4733 Obstructive sleep apnea (adult) (pediatric): Secondary | ICD-10-CM | POA: Diagnosis not present

## 2020-06-04 DIAGNOSIS — J849 Interstitial pulmonary disease, unspecified: Secondary | ICD-10-CM | POA: Diagnosis not present

## 2020-06-04 NOTE — Patient Instructions (Signed)
I am glad you are stable with regard to your breathing I have reviewed the CT which shows stable mild scarring of the lung  We will continue to keep a close watch on this Follow-up in 6 months.

## 2020-06-04 NOTE — Progress Notes (Signed)
Donald [name]    [id number]    Oct 06, 1932  Primary Care Physician:Jones, Arvid Right, MD  Referring Physician: Janith Lima, MD [address] [place],  [place] [address]  Chief complaint: Follow-up for pulmonary fibrosis  HPI: 87 year old with recurrent atrial fibrillation, OSA, ex-smoker.  Evaluated in the ILD clinic on 05/24/2022 after high-resolution CT scan that showed pulmonary fibrosis.  At that point he was being considered for amiodarone for atrial fibrillation.  Seen by Dr. Chase Caller and recommended against amiodarone.  He is currently on Tikosyn and follows with Dr. Caryl Comes, EP  He is here for further follow-up and review of labs States that breathing is doing well with no issues  Pets: No pets Occupation: Worked as a Land Exposures: Had a down pillow and comforters which he got rid of last month.  No ongoing exposure.  No mold, hot tub, Jacuzzi, asbestos, humidifier Smoking history: 50-pack-year smoker.  Quit in 2000 Travel history: From [place].  No significant Relevant family history: Brother died of pulmonary fibrosis.  Interim history: Here for follow-up of indeterminate pulmonary fibrosis States that breathing is doing well with no issues.  Outpatient Encounter Medications as of 06/04/2020  Medication Sig  . atorvastatin (LIPITOR) 40 MG tablet Take 1 tablet (40 mg total) by mouth daily.  Marland Kitchen diltiazem (CARDIZEM CD) 120 MG 24 hr capsule TAKE (1) CAPSULE TWICE DAILY.  Marland Kitchen dofetilide (TIKOSYN) 500 MCG capsule TAKE ONE CAPSULE BY MOUTH TWO TIMES A DAY  . ELIQUIS 5 MG TABS tablet TAKE 1 TABLET BY MOUTH TWICE DAILY.  . ferrous sulfate 325 (65 FE) MG tablet Take 1 tablet (325 mg total) by mouth daily with breakfast.  . FIBER PO Take by mouth.  . furosemide (LASIX) 20 MG tablet TAKE 1 TABLET EACH DAY.  . isosorbide mononitrate (IMDUR) 30 MG 24 hr tablet TAKE 1 TABLET BY MOUTH DAILY.  . Magnesium Aspartate 65 MG TABS Take 1 tablet by mouth  daily.  . metoprolol tartrate (LOPRESSOR) 25 MG tablet TAKE 1 TABLET BY MOUTH TWICE DAILY.  . metroNIDAZOLE (METROGEL) 0.75 % gel Apply 1 application topically daily as needed (for rosacea).   . Multiple Vitamins-Minerals (EYE VITAMINS) CAPS Take 1 capsule by mouth 2 (two) times daily.  . Multiple Vitamins-Minerals (MULTIVITAMIN PO) Take 1 tablet by mouth 2 (two) times daily.  . nitroGLYCERIN (NITROSTAT) 0.4 MG SL tablet PLACE 1 TABLET UNDER THE TONGUE EVERY 5 MINUTES AS NEEDED FOR CHEST PAIN, MAY REPEAT FOR 3 DOSES.  Marland Kitchen pantoprazole (PROTONIX) 40 MG tablet TAKE 1 TABLET ONCE DAILY.  . Probiotic Product (ALIGN PO) Take by mouth.  . [DISCONTINUED] Magnesium 65 MG TABS Take 7 tablets by mouth at bedtime.    No facility-administered encounter medications on file as of 06/04/2020.    Allergies as of 06/04/2020  . (No Known Allergies)    Physical Exam: Blood pressure 118/68, pulse 68, temperature (!) 97.1 F (36.2 C), temperature source Oral, height 5\' 8"  (1.727 m), weight 224 lb 12.8 oz (102 kg), SpO2 98 %. Gen:      No acute distress HEENT:  EOMI, sclera anicteric Neck:     No masses; no thyromegaly Lungs:    Clear to auscultation bilaterally; normal respiratory effort CV:         Regular rate and rhythm; no murmurs Abd:      + bowel sounds; soft, non-tender; no palpable masses, no distension Ext:    No edema; adequate  peripheral perfusion Skin:      Warm and dry; no rash Neuro: alert and oriented x 3 Psych: normal mood and affect  Data Reviewed: Imaging: CT chest 05/07/2013- mild emphysematous changes, compressive atelectasis in the right lower lobe.   CT high-resolution 04/23/2018- bronchial wall thickening with mild emphysema.  Patchy areas of peripheral septal thickening, mild groundglass.  Indeterminate for UIP. CT high-resolution 05/06/2020- mild emphysema, stable mild interstitial lung disease, indeterminate for UIP. I have reviewed the images personally.  PFTs: 04/18/2018 FVC  3.43 [96%), FEV1 2.63 [105%], F/F 77, TLC 6.37 [95%], DLCO 17.94 [60%]  05/18/2020 FVC 3.39 [97%], FEV1 2.60 [108%], F/F 77, TLC 5.54 [83%], DLCO 16.87 [75%] Mild diffusion defect  Labs: Hypersensitivity panel 05/24/2018-negative CTD serologies-ANA negative, CCP less than 16, double-stranded DNA, Ro, La, SCL 70- Rheumatoid factor 14  Aldolase 5.3 CK 3 1 ANCA-negative  Myositis panel 07/12/2018-negative  Assessment:  Interstitial lung disease, pulmonary fibrosis Unclear etiology.  The fibrosis is indeterminate for UIP on CT scan and appears to be new compared to 2014.  Possibilities include hypersensitivity pneumonitis given his exposure to down pillow and comforter or IPF as he has a family history of pulmonary fibrosis.  He has gotten rid of his down pillows and comforters in 2020  ILD serologies reviewed with borderline rheumatoid factor which is nonspecific.    Discussed further work-up including lung biopsy.  He is opposed to surgical lung biopsy or bronchoscope due to risks involved.  I agree that at his age we can continue with conservative management and monitoring especially as his CT scan shows no progression of fibrosis  OSA Compliant with CPAP  Plan/Recommendations: - Continue monitoring  Follow-up in 6 months.  Marshell Garfinkel MD [facility] 06/04/2020, 11:48 AM  CC: Janith Lima, MD

## 2020-06-13 ENCOUNTER — Other Ambulatory Visit: Payer: Self-pay | Admitting: Internal Medicine

## 2020-06-15 NOTE — Telephone Encounter (Signed)
Pt last saw Dr Caryl Comes 07/09/19, pt will be due soon for follow-up appt.  Last labs 03/05/20 Creat 1.03, age 87, weight 102kg, based on specified criteria pt is on appropriate dosage of Eliquis 5mg  BID.  Will refill rx and place note on rx to schedule f/u with MD for future refills.

## 2020-06-16 DIAGNOSIS — L570 Actinic keratosis: Secondary | ICD-10-CM | POA: Diagnosis not present

## 2020-06-16 DIAGNOSIS — L738 Other specified follicular disorders: Secondary | ICD-10-CM | POA: Diagnosis not present

## 2020-06-16 DIAGNOSIS — Z85828 Personal history of other malignant neoplasm of skin: Secondary | ICD-10-CM | POA: Diagnosis not present

## 2020-06-16 DIAGNOSIS — L111 Transient acantholytic dermatosis [Grover]: Secondary | ICD-10-CM | POA: Diagnosis not present

## 2020-06-16 DIAGNOSIS — D225 Melanocytic nevi of trunk: Secondary | ICD-10-CM | POA: Diagnosis not present

## 2020-06-16 DIAGNOSIS — L821 Other seborrheic keratosis: Secondary | ICD-10-CM | POA: Diagnosis not present

## 2020-06-19 ENCOUNTER — Telehealth: Payer: Self-pay | Admitting: Internal Medicine

## 2020-06-19 DIAGNOSIS — G4733 Obstructive sleep apnea (adult) (pediatric): Secondary | ICD-10-CM | POA: Diagnosis not present

## 2020-06-19 NOTE — Progress Notes (Signed)
  Chronic Care Management   Outreach Note  06/19/2020 Name: ERIK BURKETT MRN: [id number] DOB: Jul 25, 1932  Referred by: Janith Lima, MD Reason for referral : No chief complaint on file.   An unsuccessful telephone outreach was attempted today. The patient was referred to the pharmacist for assistance with care management and care coordination.   Follow Up Plan:   Carley Perdue [employer] Scheduler

## 2020-06-23 ENCOUNTER — Telehealth: Payer: Self-pay | Admitting: Internal Medicine

## 2020-06-23 NOTE — Progress Notes (Signed)
  Chronic Care Management   Note  06/23/2020 Name: Donald [name] MRN: [id number] DOB: 1933/04/17  Donald [name] is a 87 y.o. year old male who is a primary care patient of Janith Lima, MD. I reached out to [name] by phone today in response to a referral sent by Donald [name] PCP, Janith Lima, MD.   Donald [name] was given information about Chronic Care Management services today including:  1. CCM service includes personalized support from designated clinical staff supervised by his physician, including individualized plan of care and coordination with other care providers 2. 24/7 contact phone numbers for assistance for urgent and routine care needs. 3. Service will only be billed when office clinical staff spend 20 minutes or more in a month to coordinate care. 4. Only one practitioner may furnish and bill the service in a calendar month. 5. The patient may stop CCM services at any time (effective at the end of the month) by phone call to the office staff.   Patient agreed to services and verbal consent obtained.   Follow up plan:   Carley Perdue [facility]

## 2020-07-15 ENCOUNTER — Other Ambulatory Visit: Payer: Self-pay | Admitting: Internal Medicine

## 2020-07-20 DIAGNOSIS — G4733 Obstructive sleep apnea (adult) (pediatric): Secondary | ICD-10-CM | POA: Diagnosis not present

## 2020-08-03 ENCOUNTER — Telehealth: Payer: Self-pay

## 2020-08-03 NOTE — Telephone Encounter (Signed)
Manual transmission received from pt due to concern for AF episode.    It appears pt had 12 hour AF episode starting 08/02/20 ~7:30pm, Peak A/ V rates 645/145

## 2020-08-04 NOTE — Telephone Encounter (Signed)
Patient returned my call and is aware of appt 08/07/20 at 10:30 am with Roderic Palau, NP.

## 2020-08-04 NOTE — Telephone Encounter (Signed)
Called and left message for patient to call back to schedule appt with Roderic Palau, NP to discuss afib management.

## 2020-08-07 ENCOUNTER — Other Ambulatory Visit: Payer: Self-pay

## 2020-08-07 ENCOUNTER — Ambulatory Visit (HOSPITAL_COMMUNITY)
Admission: RE | Admit: 2020-08-07 | Discharge: 2020-08-07 | Disposition: A | Discharge status: Home / Self Care | Payer: PPO | Source: Ambulatory Visit | Attending: Nurse Practitioner | Admitting: Nurse Practitioner

## 2020-08-07 ENCOUNTER — Telehealth: Payer: Self-pay | Admitting: Pharmacist

## 2020-08-07 ENCOUNTER — Encounter (HOSPITAL_COMMUNITY): Payer: Self-pay | Admitting: Nurse Practitioner

## 2020-08-07 VITALS — BP 112/78 | HR 60 | Ht 68.0 in | Wt 226.0 lb

## 2020-08-07 DIAGNOSIS — Z79899 Other long term (current) drug therapy: Secondary | ICD-10-CM | POA: Insufficient documentation

## 2020-08-07 DIAGNOSIS — I480 Paroxysmal atrial fibrillation: Secondary | ICD-10-CM | POA: Diagnosis not present

## 2020-08-07 DIAGNOSIS — D6869 Other thrombophilia: Secondary | ICD-10-CM | POA: Diagnosis not present

## 2020-08-07 DIAGNOSIS — Z87891 Personal history of nicotine dependence: Secondary | ICD-10-CM | POA: Diagnosis not present

## 2020-08-07 DIAGNOSIS — I4891 Unspecified atrial fibrillation: Secondary | ICD-10-CM

## 2020-08-07 DIAGNOSIS — I10 Essential (primary) hypertension: Secondary | ICD-10-CM | POA: Diagnosis not present

## 2020-08-07 DIAGNOSIS — Z7901 Long term (current) use of anticoagulants: Secondary | ICD-10-CM | POA: Insufficient documentation

## 2020-08-07 LAB — BASIC METABOLIC PANEL
Anion gap: 7 (ref 5–15)
BUN: 18 mg/dL (ref 8–23)
CO2: 26 mmol/L (ref 22–32)
Calcium: 9.1 mg/dL (ref 8.9–10.3)
Chloride: 105 mmol/L (ref 98–111)
Creatinine, Ser: 0.98 mg/dL (ref 0.61–1.24)
GFR, Estimated: >60 mL/min (ref >60)
Glucose, Bld: 93 mg/dL (ref 70–99)
Potassium: 4.6 mmol/L (ref 3.5–5.1)
Sodium: 138 mmol/L (ref 135–145)

## 2020-08-07 LAB — MAGNESIUM: Magnesium: 1.9 mg/dL (ref 1.7–2.4)

## 2020-08-07 NOTE — Progress Notes (Signed)
Primary Care Physician: Janith Lima, MD Referring Physician:Dr. Rainey Kahrs is a 87 y.o. male with a h/o afib that is on [place]. He has done well on tikosn with low afib burden. He recently had 12 hours of afib, and is in SR today. He feels well . He wanted to discuss with me when to get excited about being in afib andhow to  manage going forward. He continues on eliquis 5 mg bid with a CHA2DS2VASc score of at least 4.   Today, he denies symptoms of palpitations, chest pain, shortness of breath, orthopnea, PND, lower extremity edema, dizziness, presyncope, syncope, or neurologic sequela. The patient is tolerating medications without difficulties and is otherwise without complaint today.   Past Medical History:  Diagnosis Date  . Abnormal CT scan, stomach 02/2014   Thickening of gastric fundus and cardia.  gastritis on EGD 03/2014  . Allergic rhinitis due to pollen   . Arthropathy, unspecified, site unspecified   . Atherosclerosis    a. Noted by abdominal CT 02/2014 (h/o normal nuc 2011).  . Atrial flutter ([place]) 02/24/14   s/p ablation 11/15  . Cardiomyopathy ([place])    tachy mediated - a. TEE (10/15):  EF 30%;  b. Echo after NSR restored (10/15):  mild LVH, EF 55-60%, mild AS, mild AI, mild MR, mild to mod LAE, mild RAE  . Diverticulosis    severe in descending, sigmoid colon.   . ED (erectile dysfunction)   . Esophageal stricture 03/2014   traversable with endoscope. not dilated.   . Gastric AVM   . GERD (gastroesophageal reflux disease) 03/2014   small HH and gastritis on EGD  . GI bleed   . Habitual alcohol use   . Hemorrhoids   . Hiatal hernia   . Hyperlipidemia   . Hypertension   . Obesity   . OSA on CPAP   . PAF (paroxysmal atrial fibrillation) ([place])   . Prostatitis, unspecified   . 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 Skin cancer    basal and squamous cell   Past Surgical History:  Procedure Laterality Date  . ATRIAL FLUTTER  ABLATION N/A 03/19/2014   RFCA of atrial flutter by Dr Caryl Comes  . CARDIOVERSION N/A 02/24/2014   Procedure: CARDIOVERSION;  Surgeon: Lelon Perla, MD;  Location: [facility];  Service: Cardiovascular;  Laterality: N/A;  . CARDIOVERSION Right 09/10/2014   Procedure: CARDIOVERSION;  Surgeon: Deboraha Sprang, MD;  Location: [facility] CATH LAB;  Service: Cardiovascular;  Laterality: Right;  . CARDIOVERSION N/A 01/22/2018   Procedure: CARDIOVERSION;  Surgeon: Fay Records, MD;  Location: [facility] ENDOSCOPY;  Service: Cardiovascular;  Laterality: N/A;  . COLONOSCOPY N/A 04/23/2015   Procedure: COLONOSCOPY;  Surgeon: Ladene Artist, MD;  Location[facility] ENDOSCOPY;  Service: Endoscopy;  Laterality: N/A;  . ENTEROSCOPY N/A 06/12/2015   Procedure: ENTEROSCOPY;  Surgeon: Ladene Artist, MD;  Location: [facility] ENDOSCOPY;  Service: Endoscopy;  Laterality: N/A;  . EP IMPLANTABLE DEVICE N/A 02/15/2016   Procedure: Pacemaker Implant;  Surgeon: Evans Lance, MD;  Location: [place] CV LAB;  Service: Cardiovascular;  Laterality: N/A;  . ESOPHAGOGASTRODUODENOSCOPY N/A 04/09/2014   Procedure: ESOPHAGOGASTRODUODENOSCOPY (EGD);  Surgeon: Inda Castle, MD;  Location: [place];  Service: Endoscopy;  Laterality: N/A;  . ESOPHAGOGASTRODUODENOSCOPY N/A 04/23/2015   Procedure: ESOPHAGOGASTRODUODENOSCOPY (EGD);  Surgeon: Ladene Artist, MD;  Location: [facility] ENDOSCOPY;  Service: Endoscopy;  Laterality: N/A;  .  INGUINAL HERNIA REPAIR     right  . INGUINAL HERNIA REPAIR     left  . LEFT HEART CATH AND CORONARY ANGIOGRAPHY N/A 03/16/2017   Procedure: LEFT HEART CATH AND CORONARY ANGIOGRAPHY;  Surgeon: Burnell Blanks, MD;  Location: [place] CV LAB;  Service: Cardiovascular;  Laterality: N/A;  . ORIF fracture of the elbow    . SKIN CANCER EXCISION  05/21/12   Squamous cell ca  . TEE WITHOUT CARDIOVERSION N/A 02/24/2014   Procedure: TRANSESOPHAGEAL ECHOCARDIOGRAM (TEE);  Surgeon: Lelon Perla, MD;  Location: [facility] ENDOSCOPY;   Service: Cardiovascular;  Laterality: N/A;  . TONSILLECTOMY      Current Outpatient Medications  Medication Sig Dispense Refill  . apixaban (ELIQUIS) 5 MG TABS tablet Take 1 tablet (5 mg total) by mouth 2 (two) times daily. Pt is due for follow-up appt, will need office visit with MD for future refills. 60 tablet 2  . atorvastatin (LIPITOR) 40 MG tablet Take 1 tablet (40 mg total) by mouth daily. 90 tablet 1  . diltiazem (CARDIZEM CD) 120 MG 24 hr capsule TAKE (1) CAPSULE TWICE DAILY. 180 capsule 2  . dofetilide (TIKOSYN) 500 MCG capsule TAKE ONE CAPSULE BY MOUTH TWO TIMES A DAY 180 capsule 2  . ferrous sulfate 325 (65 FE) MG tablet Take 1 tablet (325 mg total) by mouth daily with breakfast. 60 tablet 3  . FIBER PO Take by mouth.    . furosemide (LASIX) 20 MG tablet TAKE 1 TABLET EACH DAY. 90 tablet 3  . isosorbide mononitrate (IMDUR) 30 MG 24 hr tablet TAKE 1 TABLET BY MOUTH DAILY. 90 tablet 3  . Magnesium Aspartate 65 MG TABS Take 1 tablet by mouth daily.    . metoprolol tartrate (LOPRESSOR) 25 MG tablet Take 1 tablet (25 mg total) by mouth 2 (two) times daily. Please make overdue appt with Dr. Caryl Comes before anymore refills. Thank you 1st attempt 60 tablet 0  . metroNIDAZOLE (METROGEL) 0.75 % gel Apply 1 application topically daily as needed (for rosacea).     . Multiple Vitamins-Minerals (EYE VITAMINS) CAPS Take 1 capsule by mouth 2 (two) times daily.    . Multiple Vitamins-Minerals (MULTIVITAMIN PO) Take 1 tablet by mouth 2 (two) times daily.    . nitroGLYCERIN (NITROSTAT) 0.4 MG SL tablet PLACE 1 TABLET UNDER THE TONGUE EVERY 5 MINUTES AS NEEDED FOR CHEST PAIN, MAY REPEAT FOR 3 DOSES. 25 tablet 5  . pantoprazole (PROTONIX) 40 MG tablet TAKE 1 TABLET ONCE DAILY. 90 tablet 2  . Probiotic Product (ALIGN PO) Take by mouth.     No current facility-administered medications for this encounter.    No Known Allergies  Social History   Socioeconomic History  . Marital status: Married     Spouse name: Not on file  . Number of children: 3  . Years of education: 62  . Highest education level: Not on file  Occupational History  . Occupation: Therapist, sports: RETIRED  Tobacco Use  . Smoking status: Former Smoker    Packs/day: 0.00    Years: 40.00    Pack years: 0.00    Types: Cigarettes, Cigars    Quit date: 09/18/2008    Years since quitting: 11.8  . Smokeless tobacco: Former Systems developer    Types: Chew  . Tobacco comment: Has Occasional Cigar/ quit   Vaping Use  . Vaping Use: Never used  Substance and Sexual Activity  . Alcohol use: Yes    Alcohol/week:  28.0 standard drinks    Types: 21 Glasses of wine, 7 Shots of liquor per week    Comment: states he does want to drink less and cut back on the amount.   . Drug use: No  . Sexual activity: Not Currently  Other Topics Concern  . 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 married 1961. 2 sons- '64, '63 , I daughter- '66, 4 grandchildren. work: Museum/gallery curator, retired but still consults. 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      No new stressors/    Social Determinants of Health   Financial Resource Strain: Low Risk   . Difficulty of Paying Living Expenses: Not hard at all  Food Insecurity: No Food Insecurity  . Worried About Charity fundraiser in the Last Year: Never true  . Ran Out of Food in the Last Year: Never true  Transportation Needs: No Transportation Needs  . Lack of Transportation (Medical): No  . Lack of Transportation (Non-Medical): No  Physical Activity: Sufficiently Active  . Days of Exercise per Week: 5 days  . Minutes of Exercise per Session: 30 min  Stress: No Stress Concern Present  . Feeling of Stress : Not at all  Social Connections: Socially Integrated  . Frequency of Communication with Friends and Family: More than three times a week  . Frequency of Social  Gatherings with Friends and Family: More than three times a week  . Attends Religious Services: More than 4 times per year  . Active Member of Clubs or Organizations: Yes  . Attends Archivist Meetings: More than 4 times per year  . Marital Status: Married  Human resources officer Violence: Not on file    Family History  Problem Relation Age of Onset  . Cancer Mother        ovarian  . Other Father        renal disease- grief over loss of spouse  . Cancer Brother        prostate- died of hematologic disorder 2nd to chemo  . Pulmonary fibrosis Brother   . Diabetes Neg Hx   . Coronary artery disease Neg Hx   . Colon cancer Neg Hx   . Stomach cancer Neg Hx   . Rectal cancer Neg Hx     ROS- All systems are reviewed and negative except as per the HPI above  Physical Exam: Vitals:   08/07/20 1036  BP: 112/78  Pulse: 60  Weight: 102.5 kg  Height: 5\' 8"  (1.727 m)   Wt Readings from Last 3 Encounters:  08/07/20 102.5 kg  06/04/20 102 kg  04/28/20 102.6 kg    Labs: Lab Results  Component Value Date   NA 138 03/05/2020   K 4.8 03/05/2020   CL 104 03/05/2020   CO2 28 03/05/2020   GLUCOSE 95 03/05/2020   BUN 22 03/05/2020   CREATININE 1.03 03/05/2020   CALCIUM 9.4 03/05/2020   PHOS 2.7 02/14/2016   MG 2.0 02/25/2019   Lab Results  Component Value Date   INR 1.52 06/08/2017   Lab Results  Component Value Date   CHOL 185 03/05/2020   HDL 47.20 03/05/2020   LDLCALC 124 (H) 03/05/2020   TRIG 71.0 03/05/2020     GEN- The patient is well appearing, alert and oriented x 3 today.   Head- normocephalic, atraumatic Eyes-  Sclera clear, conjunctiva  pink Ears- hearing intact Oropharynx- clear Neck- supple, no JVP Lymph- no cervical lymphadenopathy Lungs- Clear to ausculation bilaterally, normal work of breathing Heart- Regular rate and rhythm, no murmurs, rubs or gallops, PMI not laterally displaced GI- soft, NT, ND, + BS Extremities- no clubbing, cyanosis, or  edema MS- no significant deformity or atrophy Skin- no rash or lesion Psych- euthymic mood, full affect Neuro- strength and sensation are intact  EKG-a paced rhythm at 60 bpm     Assessment and Plan: 1. Afib  Low burden  Reassured that he is doing well  Discussed with pt when to get concerned and reminded him he can call here with his questions I feel the Phyllis Ginger is still working well for him Continue dofetilide 500 mcg bid  Continue metoprolol 25 mg bid  Has 30 mg cardizem to use as needed, discussed use  tikosyn labs, bmet/mag today   2. CHA2DS2VASc score of 4 Continue eliquis 5 mg bid    3. PPM Per Dr. Caryl Comes and the device clinic   4. HTN Stable   F/u with afib clinic  as needed   Butch Penny C. Carroll, [facility] [address] [place],  [address] [phone]

## 2020-08-10 ENCOUNTER — Other Ambulatory Visit: Payer: Self-pay

## 2020-08-10 ENCOUNTER — Ambulatory Visit (INDEPENDENT_AMBULATORY_CARE_PROVIDER_SITE_OTHER): Payer: PPO | Admitting: Pharmacist

## 2020-08-10 DIAGNOSIS — I10 Essential (primary) hypertension: Secondary | ICD-10-CM | POA: Diagnosis not present

## 2020-08-10 DIAGNOSIS — E785 Hyperlipidemia, unspecified: Secondary | ICD-10-CM

## 2020-08-10 DIAGNOSIS — I2510 Atherosclerotic heart disease of native coronary artery without angina pectoris: Secondary | ICD-10-CM

## 2020-08-10 DIAGNOSIS — I480 Paroxysmal atrial fibrillation: Secondary | ICD-10-CM

## 2020-08-10 NOTE — Progress Notes (Signed)
    Chronic Care Management Pharmacy Assistant   Name: Donald [name]  MRN: [id number] DOB: 01/10/1933   Reason for Encounter: Donald [name]    Medications: Outpatient Encounter Medications as of 08/07/2020  Medication Sig  . apixaban (ELIQUIS) 5 MG TABS tablet Take 1 tablet (5 mg total) by mouth 2 (two) times daily. Pt is due for follow-up appt, will need office visit with MD for future refills.  Marland Kitchen atorvastatin (LIPITOR) 40 MG tablet Take 1 tablet (40 mg total) by mouth daily.  Marland Kitchen diltiazem (CARDIZEM CD) 120 MG 24 hr capsule TAKE (1) CAPSULE TWICE DAILY.  Marland Kitchen dofetilide (TIKOSYN) 500 MCG capsule TAKE ONE CAPSULE BY MOUTH TWO TIMES A DAY  . ferrous sulfate 325 (65 FE) MG tablet Take 1 tablet (325 mg total) by mouth daily with breakfast.  . FIBER PO Take by mouth.  . furosemide (LASIX) 20 MG tablet TAKE 1 TABLET EACH DAY.  . isosorbide mononitrate (IMDUR) 30 MG 24 hr tablet TAKE 1 TABLET BY MOUTH DAILY.  . Magnesium Aspartate 65 MG TABS Take 1 tablet by mouth daily.  . metoprolol tartrate (LOPRESSOR) 25 MG tablet Take 1 tablet (25 mg total) by mouth 2 (two) times daily. Please make overdue appt with Dr. Caryl Comes before anymore refills. Thank you 1st attempt  . metroNIDAZOLE (METROGEL) 0.75 % gel Apply 1 application topically daily as needed (for rosacea).   . Multiple Vitamins-Minerals (EYE VITAMINS) CAPS Take 1 capsule by mouth 2 (two) times daily.  . Multiple Vitamins-Minerals (MULTIVITAMIN PO) Take 1 tablet by mouth 2 (two) times daily.  . nitroGLYCERIN (NITROSTAT) 0.4 MG SL tablet PLACE 1 TABLET UNDER THE TONGUE EVERY 5 MINUTES AS NEEDED FOR CHEST PAIN, MAY REPEAT FOR 3 DOSES.  Marland Kitchen pantoprazole (PROTONIX) 40 MG tablet TAKE 1 TABLET ONCE DAILY.  . Probiotic Product (ALIGN PO) Take by mouth.   No facility-administered encounter medications on file as of 08/07/2020.    The patient had an initial televisit with the clinical pharmacist Charlene Brooke on 08/10/20. Upon completion of this visit the  patient has agreed to try [facility] services for their dispensing and delivery of medications. Per clinical pharmacist request I completed an on boarding form with the list of the patients medications, current pharmacy, demographics, allergies and insurance information. The form was then forward to the clinical pharmacist Mendel Ryder for review.   Wendy Poet, [place] [phone]  Time spent: 40 minutes-telephone call, review chart notes and documentation

## 2020-08-10 NOTE — Patient Instructions (Addendum)
Visit Information  Phone number for Pharmacist: [phone]  Thank you for meeting with me to discuss your medications! I look forward to working with you to achieve your health care goals. Below is a summary of what we talked about during the visit:  Goals Addressed            This Visit's Progress   . Manage My Medicine       Timeframe:  Long-Range Goal Priority:  High Start Date:     08/10/20                        Expected End Date: 02/12/21                      Follow Up Date 11/12/20   - call for medicine refill 2 or 3 days before it runs out - call if I am sick and can't take my medicine - keep a list of all the medicines I take; vitamins and herbals too  -Utilize [facility] for medication synchronization, packaging and delivery   Why is this important?   . These steps will help you keep on track with your medicines.   Notes:       Patient Care Plan: CCM Pharmacy Care Plan    Problem Identified: Hypertension, Hyperlipidemia, Atrial Fibrillation, Coronary Artery Disease and GERD   Priority: High    Long-Range Goal: Disease management   Start Date: 08/10/2020  Expected End Date: 02/10/2021  This Visit's Progress: On track  Priority: High  Note:   Current Barriers:  . Unable to independently monitor therapeutic efficacy . Suboptimal pharmacy services  Pharmacist Clinical Goal(s):  Marland Kitchen Patient will achieve adherence to monitoring guidelines and medication adherence to achieve therapeutic efficacy . Utilize [facility] for medication synchronization, packaging and delivery through collaboration with PharmD and provider.   Interventions: . 1:1 collaboration with Janith Lima, MD regarding development and update of comprehensive plan of care as evidenced by provider attestation and co-signature . Inter-disciplinary care team collaboration (see longitudinal plan of care) . Comprehensive medication review performed; medication list updated in electronic  medical record  Hypertension (BP goal <130/80) -Controlled - BP was at goal during recent office visits -Current treatment: . Diltiazem CD 120 mg BID . Furosemide 20 mg daily . Isosorbide MN 30 mg daily . Metoprolol tartrate 25 mg BID -Current home readings: n/a -Current exercise habits: pool exercises 4 days a week, [employer] 3 days a week -Denies hypotensive/hypertensive symptoms -Educated on BP goals and benefits of medications for prevention of heart attack, stroke and kidney damage; Importance of home blood pressure monitoring; -Counseled to monitor BP at home as needed, document, and provide log at future appointments -Recommended to continue current medication  Hyperlipidemia: (LDL goal < 70) -Not ideally controlled - LDL increased to 124 last year after beng < 70 for many years, possibly due to pandemic changes in diet/exercise routine. Pt endorses compliance with atorvastatin. He also endorses daily leg cramps, worse at night, that he reports are his biggest health complaint right now. He take magnesium ([employer]) nightly for this with little improvement. -hx aortic atherosclerosis -Current treatment: . Atorvastatin 40 mg daily . Nitroglycerin 0.4 mg SL prn . Isosorbide MN 30 mg daily -Educated on Cholesterol goals;  Benefits of statin for ASCVD risk reduction; Strategies to manage statin-induced myalgias;  -Recommend to change atorvastatin to rosuvastatin 10 mg and f/u with PCP next month for  lipid panel  Atrial Fibrillation (Goal: prevent stroke and major bleeding) -Controlled - pt reports infrequent symptomatic Afib, he was told by Afib clinic that he may take 1/2 dose of metoprolol if HR is >100 and SBP > 100 -low Afib burden, PPM in place -CHADSVASC: 4 -Current treatment: . Rate control: Dofetilide 500 mg BID, Diltiazem CD 120 mg BID, metoprolol tartrate 25 mg BID . Anticoagulation: Eliquis 5 mg BID -Home BP and HR readings: n/a -Counseled on increased  risk of stroke due to Afib and benefits of anticoagulation for stroke prevention; importance of adherence to anticoagulant exactly as prescribed; -Assessed patient finances. He will not qualify for patient assistance -Recommended to continue current medication  GERD (Goal: manage symptoms) -Controlled -Current treatment  . Pantoprazole 40 mg daily -Recommended to continue current medication  Health Maintenance -Vaccine gaps: none -Current therapy:  . Fiber . Probiotic (Align) . Ferrous sulfate 25 mg daily . Multivitamin . Eye Cap . Metronidazole 0.75% gel prn (rosacea) -Patient is satisfied with current therapy and denies issues -Recommended to continue current medication  Patient Goals/Self-Care Activities . Patient will:  - take medications as prescribed focus on medication adherence by pill packs check blood pressure as needed, document, and provide at future appointments target a minimum of 150 minutes of moderate intensity exercise weekly  Follow Up Plan: Telephone follow up appointment with care management team member scheduled for: 3 months      Mr. Veasey was given information about Chronic Care Management services today including:  1. CCM service includes personalized support from designated clinical staff supervised by his physician, including individualized plan of care and coordination with other care providers 2. 24/7 contact phone numbers for assistance for urgent and routine care needs. 3. Standard insurance, coinsurance, copays and deductibles apply for chronic care management only during months in which we provide at least 20 minutes of these services. Most insurances cover these services at 100%, however patients may be responsible for any copay, coinsurance and/or deductible if applicable. This service may help you avoid the need for more expensive face-to-face services. 4. Only one practitioner may furnish and bill the service in a calendar month. 5. The  patient may stop CCM services at any time (effective at the end of the month) by phone call to the office staff.  Patient agreed to services and verbal consent obtained.   Patient verbalizes understanding of instructions provided today and agrees to view in [place].  Telephone follow up appointment with pharmacy team member scheduled for: 3 months  Charlene Brooke, PharmD, BCACP Clinical Pharmacist [facility] [phone]  Statin Intolerance Statin intolerance is the inability to take a certain type of cholesterol-lowering medicine (statin) because of unwanted side effects, such as muscle pain. Statins may be prescribed to improve cholesterol levels and lower the risk for heart attack, heart disease, and stroke. People with statin intolerance may experience muscle pain and cramps (myalgia) that go away when the statin is stopped. What are the causes? The cause of this condition is not known. What increases the risk? You may be at higher risk for statin intolerance if you:  Take more than one type of cholesterol-lowering medicine at a time.  Need a higher than normal dosage of a statin.  Have a history of high CK (creatine kinase) in the blood. CK is an enzyme that is released when muscle tissue is damaged.  Are a woman.  Have a body size that is smaller than normal.  Are  age 23 or older.  Drink a lot of alcohol.  Are of Asian descent.  Have kidney, liver, or muscle disease.  Have a low level of hormones that control how your body uses energy (hypothyroidism).  Take certain medicines, including: ? Certain medicines for mental illness (antipsychotics). ? Some types of antibiotics. ? Certain medicines used for blood pressure or heart disease. ? Medicines that reduce the activity of the body's disease-fighting system (immunosuppressants). ? Medicines to treat hepatitis C and HIV/AIDS (human immunodeficiency virus/acquired immunodeficiency  syndrome).  Follow an intense exercise program.  Drink a lot of grapefruit juice.  Have a lack of vitamin D (deficiency). What are the signs or symptoms? Signs and symptoms of statin intolerance include:  General muscle aches (myalgia). This may feel similar to muscle aches that are caused by the flu.  Muscle pain, tenderness, cramps, or weakness (myositis).  Severe muscle pain, weakness, and raised blood CK levels (rhabdomyolysis). Symptoms usually go away when the statin is stopped. Rarely, liver damage can also occur, which can cause:  Loss of appetite.  Pain in the upper right abdomen.  Yellowing of the skin or the white parts of the eyes (jaundice). How is this diagnosed? This condition is diagnosed based on your symptoms, your medical history, and a physical exam. You may also have blood tests. How is this treated? Your health care provider may have you stop taking the statin for a short time to see if your symptoms go away. Then your provider may restart your statin, or:  Change you to a different statin.  Lower the dosage of your statin.  Have you take your statin less often.  Change you to another type of cholesterol-lowering medicine.  Stop or change any medicines that might be interfering with your statin.  Limit how much grapefruit juice you drink.  Recommend stopping intense exercise. Follow these instructions at home: Medicines  Take your statin medicine as told by your health care provider. Do not stop taking the statin unless your health care provider tells you to stop.  Take other over-the-counter and prescription medicines only as told by your health care provider.  Check with your health care provider before taking any new medicines. Certain medicines can increase your risk for statin intolerance. Lifestyle  Limit alcohol intake to no more than 1 drink a day for nonpregnant women and 2 drinks a day for men. One drink equals 12 oz of beer, 5 oz of  wine, or 1 oz of hard liquor.  Do not use any products that contain nicotine or tobacco, such as cigarettes and e-cigarettes. If you need help quitting, ask your health care provider. General instructions  Have blood tests to check CK levels or liver enzymes as told by your health care provider.  Exercise as directed. Ask your health care provider what exercises are best for you. Do not start a new exercise program before talking about it with your health care provider.  Follow instructions from your health care provider about eating or drinking restrictions. Your health care provider may recommend: ? Limiting the amount of grapefruit juice you drink, or not drinking it at all. ? Eating a diet that is low in saturated fats and high in fiber.  Maintain a healthy weight with diet and exercise.  Keep all follow-up visits as told by your health care provider. This is important.   Contact a health care provider if:  You have any symptoms of statin intolerance. Summary  Statins are important medicines  for improving your cholesterol, which may reduce your risk for heart attack and stroke.  Some people are not able to continue taking a particular statin because of muscle problems (myalgia) or other side effects.  Myalgia is the most common symptom of statin intolerance. Often, the muscle pain and cramps from myalgia go away when the statin is stopped.  Although rare, liver damage can occur as a result of statin intolerance. You should have routine blood tests to check your liver enzymes.  In most cases, statin intolerance can be managed and you can continue to take a cholesterol-lowering medicine. This information is not intended to replace advice given to you by your health care provider. Make sure you discuss any questions you have with your health care provider. Document Revised: 08/23/2019 Document Reviewed: 08/23/2019 Elsevier Patient Education  2021 [employer].

## 2020-08-10 NOTE — Progress Notes (Signed)
Chronic Care Management Pharmacy Note  08/10/2020 Name:  DEDRIC ETHINGTON MRN:  [id number] DOB:  [birth year]  Subjective: Donald [name] is an 87 y.o. year old male who is a primary patient of Janith Lima, MD.  The CCM team was consulted for assistance with disease management and care coordination needs.    Engaged with patient face to face for initial visit in response to provider referral for pharmacy case management and/or care coordination services.   Consent to Services:  The patient was given the following information about Chronic Care Management services today, agreed to services, and gave verbal consent: 1. CCM service includes personalized support from designated clinical staff supervised by the primary care provider, including individualized plan of care and coordination with other care providers 2. 24/7 contact phone numbers for assistance for urgent and routine care needs. 3. Service will only be billed when office clinical staff spend 20 minutes or more in a month to coordinate care. 4. Only one practitioner may furnish and bill the service in a calendar month. 5.The patient may stop CCM services at any time (effective at the end of the month) by phone call to the office staff. 6. The patient will be responsible for cost sharing (co-pay) of up to 20% of the service fee (after annual deductible is met). Patient agreed to services and consent obtained.  Patient Care Team: Janith Lima, MD as PCP - General (Internal Medicine) Deboraha Sprang, MD as PCP - Cardiology (Cardiology) Franchot Gallo, MD (Urology) [name], [name], MD (Dermatology) Monna Fam, MD as Consulting Physician (Ophthalmology) Deboraha Sprang, MD as Consulting Physician (Cardiology) Pyrtle, Lajuan Lines, MD as Consulting Physician (Gastroenterology) Brand Males, MD as Consulting Physician (Pulmonary Disease) Charlton Haws, [facility] as Pharmacist (Pharmacist)  Recent office visits: 03/05/20 Dr Ronnald Ramp OV:  CPE. Requests DNR. No med changes.  Recent consult visits: 08/07/20 NP Roderic Palau (Afib clinic): pt is doing well, no med changes.  06/16/20 Dr [name] (dermatology): f/u hx skin cancer  06/04/20 Dr Vaughan Browner (pulmonary): f/u ILD, OSA. Unknown etiology for ILD. Declined lung biopsy. Continue conservative management.  06/02/20 Dr Nelva Bush (phys med): f/u DDD (lumbar)  Hospital visits: None in previous 6 months  Objective:  Lab Results  Component Value Date   CREATININE 0.98 08/07/2020   BUN 18 08/07/2020   GFR 71.00 03/05/2020   GFRNONAA >60 08/07/2020   GFRAA >60 02/25/2019   NA 138 08/07/2020   K 4.6 08/07/2020   CALCIUM 9.1 08/07/2020   CO2 26 08/07/2020   GLUCOSE 93 08/07/2020    Lab Results  Component Value Date/Time   GFR 71.00 03/05/2020 09:53 AM   GFR 70.38 09/06/2016 09:27 AM    Last diabetic Eye exam: No results found for: HMDIABEYEEXA  Last diabetic Foot exam: No results found for: HMDIABFOOTEX   Lab Results  Component Value Date   CHOL 185 03/05/2020   HDL 47.20 03/05/2020   LDLCALC 124 (H) 03/05/2020   LDLDIRECT 138.8 04/18/2011   TRIG 71.0 03/05/2020   CHOLHDL 4 03/05/2020    Hepatic Function Latest Ref Rng & Units 03/05/2020 11/22/2018 04/11/2018  Total Protein 6.0 - 8.3 g/dL 6.8 7.6 6.8  Albumin 3.5 - 5.2 g/dL 4.1 4.6 3.8  AST 0 - 37 U/L $Remo'20 17 20  'IzUzD$ ALT 0 - 53 U/L $Remo'17 15 19  'PwLBh$ Alk Phosphatase 39 - 117 U/L 70 76 72  Total Bilirubin 0.2 - 1.2 mg/dL 0.5 1.0 0.7  Bilirubin, Direct 0.0 - 0.3 mg/dL  0.1 0.2 -    Lab Results  Component Value Date/Time   TSH 0.65 03/05/2020 09:53 AM   TSH 0.68 11/22/2018 03:40 PM    CBC Latest Ref Rng & Units 02/24/2020 08/20/2019 02/25/2019  WBC 4.0 - 10.5 K/uL 8.3 7.6 8.6  Hemoglobin 13.0 - 17.0 g/dL 35.8 12.9(L) 14.0  Hematocrit 39.0 - 52.0 % 42.1 37.3(L) 40.6  Platelets 150.0 - 400.0 K/uL 193.0 192.0 200    No results found for: VD25OH  Clinical ASCVD: Yes  The ASCVD Risk score Denman George DC Jr., et al., 2013) failed to  calculate for the following reasons:   The 2013 ASCVD risk score is only valid for ages 31 to 31    Depression screen PHQ 2/9 04/28/2020 03/18/2019 11/23/2018  Decreased Interest 0 0 0  Down, Depressed, Hopeless 0 0 0  PHQ - 2 Score 0 0 0  Altered sleeping - - -  Tired, decreased energy - - -  Change in appetite - - -  Feeling bad or failure about yourself  - - -  Trouble concentrating - - -  Moving slowly or fidgety/restless - - -  Suicidal thoughts - - -  PHQ-9 Score - - -  Difficult doing work/chores - - -  Some recent data might be hidden     CHA2DS2-VASc Score = 4  The patient's score is based upon: CHF History: No HTN History: Yes Diabetes History: No Stroke History: No Vascular Disease History: Yes Age Score: 2 Gender Score: 0      Social History   Tobacco Use  Smoking Status Former Smoker  . Packs/day: 0.00  . Years: 40.00  . Pack years: 0.00  . Types: Cigarettes, Cigars  . Quit date: 09/18/2008  . Years since quitting: 11.9  Smokeless Tobacco Former Neurosurgeon  . Types: Chew  Tobacco Comment   Has Occasional Cigar/ quit    BP Readings from Last 3 Encounters:  08/07/20 112/78  06/04/20 118/68  04/28/20 120/72   Pulse Readings from Last 3 Encounters:  08/07/20 60  06/04/20 68  04/28/20 70   Wt Readings from Last 3 Encounters:  08/07/20 226 lb (102.5 kg)  06/04/20 224 lb 12.8 oz (102 kg)  04/28/20 226 lb 3.2 oz (102.6 kg)   BMI Readings from Last 3 Encounters:  08/07/20 34.36 kg/m  06/04/20 34.18 kg/m  04/28/20 33.40 kg/m    Assessment/Interventions: Review of patient past medical history, allergies, medications, health status, including review of consultants reports, laboratory and other test data, was performed as part of comprehensive evaluation and provision of chronic care management services.   SDOH:  (Social Determinants of Health) assessments and interventions performed: Yes  SDOH Screenings   Alcohol Screen: Not on file  Depression  (PHQ2-9): Low Risk   . PHQ-2 Score: 0  Financial Resource Strain: Low Risk   . Difficulty of Paying Living Expenses: Not hard at all  Food Insecurity: No Food Insecurity  . Worried About Programme researcher, broadcasting/film/video in the Last Year: Never true  . Ran Out of Food in the Last Year: Never true  Housing: Low Risk   . Last Housing Risk Score: 0  Physical Activity: Sufficiently Active  . Days of Exercise per Week: 5 days  . Minutes of Exercise per Session: 30 min  Social Connections: Socially Integrated  . Frequency of Communication with Friends and Family: More than three times a week  . Frequency of Social Gatherings with Friends and Family: More than three times a  week  . Attends Religious Services: More than 4 times per year  . Active Member of Clubs or Organizations: Yes  . Attends Banker Meetings: More than 4 times per year  . Marital Status: Married  Stress: No Stress Concern Present  . Feeling of Stress : Not at all  Tobacco Use: Medium Risk  . Smoking Tobacco Use: Former Smoker  . Smokeless Tobacco Use: Former Dispensing optician Needs: No Transportation Needs  . Lack of Transportation (Medical): No  . Lack of Transportation (Non-Medical): No    CCM Care Plan  No Known Allergies  Medications Reviewed Today    Reviewed by Kathyrn Sheriff, [facility] (Pharmacist) on 08/10/20 at 1225  Med List Status: <None>  Medication Order Taking? Sig Documenting Provider Last Dose Status Informant  apixaban (ELIQUIS) 5 MG TABS tablet [id number] Yes Take 1 tablet (5 mg total) by mouth 2 (two) times daily. Pt is due for follow-up appt, will need office visit with MD for future refills. Duke Salvia, MD Taking Active   atorvastatin (LIPITOR) 40 MG tablet [id number] Yes Take 1 tablet (40 mg total) by mouth daily. Etta Grandchild, MD Taking Active   diltiazem [facility] CD) 120 MG 24 hr capsule [id number] Yes TAKE (1) CAPSULE TWICE DAILY. Duke Salvia, MD Taking Active   dofetilide  [facility]) 500 MCG capsule [id number] Yes TAKE ONE CAPSULE BY MOUTH TWO TIMES A DAY Duke Salvia, MD Taking Active   ferrous sulfate 325 (65 FE) MG tablet [id number] Yes Take 1 tablet (325 mg total) by mouth daily with breakfast. Richarda Overlie, MD Taking Active Self  FIBER PO [id number] Yes Take by mouth. [provider] Taking Active   furosemide (LASIX) 20 MG tablet [id number] Yes TAKE 1 TABLET EACH DAY. Duke Salvia, MD Taking Active   isosorbide mononitrate (IMDUR) 30 MG 24 hr tablet [id number] Yes TAKE 1 TABLET BY MOUTH DAILY. Duke Salvia, MD Taking Active   Magnesium Aspartate 65 MG TABS [id number] Yes Take 1 tablet by mouth daily. [provider] Taking Active   metoprolol tartrate (LOPRESSOR) 25 MG tablet [id number] Yes Take 1 tablet (25 mg total) by mouth 2 (two) times daily. Please make overdue appt with Dr. Graciela Husbands before anymore refills. Thank you 1st attempt Duke Salvia, MD Taking Active   metroNIDAZOLE (METROGEL) 0.75 % gel [id number] Yes Apply 1 application topically daily as needed (for rosacea).  [provider] Taking Active Self  Multiple Vitamins-Minerals (EYE VITAMINS) CAPS [id number] Yes Take 1 capsule by mouth 2 (two) times daily. [provider] Taking Active Self  Multiple Vitamins-Minerals (MULTIVITAMIN PO) [id number] Yes Take 1 tablet by mouth 2 (two) times daily. [provider] Taking Active Self  nitroGLYCERIN (NITROSTAT) 0.4 MG SL tablet [id number] Yes PLACE 1 TABLET UNDER THE TONGUE EVERY 5 MINUTES AS NEEDED FOR CHEST PAIN, MAY REPEAT FOR 3 DOSES. Duke Salvia, MD Taking Active Self  pantoprazole (PROTONIX) 40 MG tablet [id number] Yes TAKE 1 TABLET ONCE DAILY. Beverley Fiedler, MD Taking Active   Probiotic Product (ALIGN PO) [id number] Yes Take by mouth. [provider] Taking Active   Med List Note Haywood Pao, CPhT 03/10/17 1021): CPAP          Patient Active Problem List   Diagnosis Date Noted  .  Pacemaker 07/05/2019  . ILD (interstitial lung disease) (HCC) 04/23/2019  . Emphysema lung (HCC) 04/19/2018  . Coronary artery disease involving native coronary artery of native heart without  angina pectoris 06/08/2017  . Tachycardia 06/08/2017  . Renal cyst, left 03/08/2016  . Iron deficiency anemia due to chronic blood loss 04/29/2015  . Anticoagulant long-term use   . Hearing loss 01/20/2015  . Esophageal stricture 04/09/2014  . Gastritis and gastroduodenitis 04/09/2014  . PAF (paroxysmal atrial fibrillation) ([place]) 04/08/2014  . Atrial flutter ([place]) 03/19/2014  . Habitual alcohol use   . Obesity (BMI 30.0-34.9)   . OSA (obstructive sleep apnea) 02/03/2014  . Hyperlipidemia with target LDL less than 100 05/07/2013  . Routine health maintenance 04/19/2011  . Essential hypertension 03/10/2008  . ERECTILE DYSFUNCTION 03/20/2007  . ATHEROSCLEROSIS, AORTIC 01/24/2007  . Allergic rhinitis due to pollen 01/24/2007    Immunization History  Administered Date(s) Administered  . Fluad Quad(high Dose 65+) 01/19/2019  . H1N1 04/28/2008  . Influenza Split 02/23/2011, 02/07/2012  . Influenza Whole 02/07/2008, 02/04/2009  . Influenza, High Dose Seasonal PF 01/20/2015, 03/07/2016, 02/20/2017, 02/20/2018  . Influenza,inj,Quad PF,6+ Mos 02/07/2013, 02/03/2014  . Influenza-Unspecified 02/20/2020  . Moderna Sars-Covid-2 Vaccination 05/28/2019, 06/26/2019, 03/27/2020  . Pneumococcal Conjugate-13 05/07/2013  . Pneumococcal Polysaccharide-23 03/20/2007, 03/07/2016  . Td 04/11/2008  . Tdap 04/16/2018  . Zoster 01/27/2010  . Zoster Recombinat (Shingrix) 02/27/2018, 04/30/2018    Conditions to be addressed/monitored:  Hypertension, Hyperlipidemia, Atrial Fibrillation, Coronary Artery Disease and GERD  Care Plan : CCM Pharmacy Care Plan  Updates made by Charlton Haws, [place] since 08/10/2020 12:00 AM    Problem: Hypertension, Hyperlipidemia, Atrial Fibrillation, Coronary Artery Disease and  GERD   Priority: High    Long-Range Goal: Disease management   Start Date: 08/10/2020  Expected End Date: 02/10/2021  This Visit's Progress: On track  Priority: High  Note:   Current Barriers:  . Unable to independently monitor therapeutic efficacy . Suboptimal pharmacy services  Pharmacist Clinical Goal(s):  Marland Kitchen Patient will achieve adherence to monitoring guidelines and medication adherence to achieve therapeutic efficacy . Utilize [facility] for medication synchronization, packaging and delivery through collaboration with PharmD and provider.   Interventions: . 1:1 collaboration with Janith Lima, MD regarding development and update of comprehensive plan of care as evidenced by provider attestation and co-signature . Inter-disciplinary care team collaboration (see longitudinal plan of care) . Comprehensive medication review performed; medication list updated in electronic medical record  Hypertension (BP goal <130/80) -Controlled - BP was at goal during recent office visits -Current treatment: . Diltiazem CD 120 mg BID . Furosemide 20 mg daily . Isosorbide MN 30 mg daily . Metoprolol tartrate 25 mg BID -Current home readings: n/a -Current exercise habits: pool exercises 4 days a week, [employer] 3 days a week -Denies hypotensive/hypertensive symptoms -Educated on BP goals and benefits of medications for prevention of heart attack, stroke and kidney damage; Importance of home blood pressure monitoring; -Counseled to monitor BP at home as needed, document, and provide log at future appointments -Recommended to continue current medication  Hyperlipidemia: (LDL goal < 70) -Not ideally controlled - LDL increased to 124 last year after beng < 70 for many years, possibly due to pandemic changes in diet/exercise routine. Pt endorses compliance with atorvastatin. He also endorses daily leg cramps, worse at night, that he reports are his biggest health complaint right  now. He take magnesium ([employer]) nightly for this with little improvement. -hx aortic atherosclerosis -Current treatment: . Atorvastatin 40 mg daily . Nitroglycerin 0.4 mg SL prn . Isosorbide MN 30 mg daily -Educated on Cholesterol goals;  Benefits of statin for ASCVD risk  reduction; Strategies to manage statin-induced myalgias;  -Recommend to change atorvastatin to rosuvastatin 10 mg and f/u with PCP next month for lipid panel  Atrial Fibrillation (Goal: prevent stroke and major bleeding) -Controlled - pt reports infrequent symptomatic Afib, he was told by Afib clinic that he may take 1/2 dose of metoprolol if HR is >100 and SBP > 100 -low Afib burden, PPM in place -CHADSVASC: 4 -Current treatment: . Rate control: Dofetilide 500 mg BID, Diltiazem CD 120 mg BID, metoprolol tartrate 25 mg BID . Anticoagulation: Eliquis 5 mg BID -Home BP and HR readings: n/a -Counseled on increased risk of stroke due to Afib and benefits of anticoagulation for stroke prevention; importance of adherence to anticoagulant exactly as prescribed; -Assessed patient finances. He will not qualify for patient assistance for Eliquis. Obtained tier exception for dofetilide - will now be Tier 1 copay. -Recommended to continue current medication  GERD (Goal: manage symptoms) -Controlled -Current treatment  . Pantoprazole 40 mg daily -Recommended to continue current medication  Health Maintenance -Vaccine gaps: none -Current therapy:  . Fiber . Probiotic (Align) . Ferrous sulfate 25 mg daily . Multivitamin . Eye Cap . Metronidazole 0.75% gel prn (rosacea) -Patient is satisfied with current therapy and denies issues -Recommended to continue current medication  Patient Goals/Self-Care Activities . Patient will:  - take medications as prescribed focus on medication adherence by pill packs check blood pressure as needed, document, and provide at future appointments target a minimum of 150 minutes of  moderate intensity exercise weekly  Follow Up Plan: Telephone follow up appointment with care management team member scheduled for: 3 months      Medication Assistance:  -pt is using [facility] for dofetilide, pays $50 for 3 month supply. Per formulary it is a Tier 4 (non-preferred) and $90/month. Pt qualifies for tier exception since amiodarone and flecainide should be avoided due to history of lung disease and structural heart disease, respectively. Contacted [employer] and tier exception was approved - pt will now be responsible for Tier 1 copay for dofetilide.  -Eliquis is Tier 3 and expensive during donut hole, however pt income is too high to qualify for pt assistance  Patient's preferred pharmacy is:  [facility], [place] [address] Phone: [phone] Fax: [phone]  [facility] - [place], [place] - [address] [place] [address] Phone: [phone] Fax: [phone]  Uses pill box? Yes Pt endorses 100% compliance  We discussed: Verbal consent obtained for [facility] enhanced pharmacy services (medication synchronization, adherence packaging, delivery coordination). A medication sync plan was created to allow patient to get all medications delivered once every 30 to 90 days per patient preference. Patient understands they have freedom to choose pharmacy and clinical pharmacist will coordinate care between all prescribers and [facility].  Patient decided to: Utilize [facility] for medication synchronization, packaging and delivery  Care Plan and Follow Up Patient Decision:  Patient agrees to Care Plan and Follow-up.  Plan: Telephone follow up appointment with care management team member scheduled for:  3 months  Charlene Brooke, PharmD, [place], CPP Clinical Pharmacist [facility] [phone]

## 2020-08-11 ENCOUNTER — Other Ambulatory Visit: Payer: Self-pay

## 2020-08-11 ENCOUNTER — Telehealth: Payer: Self-pay | Admitting: Internal Medicine

## 2020-08-11 ENCOUNTER — Other Ambulatory Visit: Payer: Self-pay | Admitting: Internal Medicine

## 2020-08-11 ENCOUNTER — Encounter: Payer: Self-pay | Admitting: Internal Medicine

## 2020-08-11 DIAGNOSIS — E785 Hyperlipidemia, unspecified: Secondary | ICD-10-CM

## 2020-08-11 DIAGNOSIS — I2510 Atherosclerotic heart disease of native coronary artery without angina pectoris: Secondary | ICD-10-CM

## 2020-08-11 DIAGNOSIS — I700 Atherosclerosis of aorta: Secondary | ICD-10-CM

## 2020-08-11 MED ORDER — APIXABAN 5 MG PO TABS: 5.0000 mg | ORAL_TABLET | Freq: Two times a day (BID) | ORAL | 6 refills | Status: DC

## 2020-08-11 MED ORDER — NITROGLYCERIN 0.4 MG SL SUBL: SUBLINGUAL_TABLET | SUBLINGUAL | 0 refills | Status: DC

## 2020-08-11 MED ORDER — ROSUVASTATIN CALCIUM 10 MG PO TABS: 10.0000 mg | ORAL_TABLET | Freq: Every day | ORAL | 1 refills | Status: DC

## 2020-08-11 MED ORDER — METOPROLOL TARTRATE 25 MG PO TABS: 25.0000 mg | ORAL_TABLET | Freq: Two times a day (BID) | ORAL | 0 refills | Status: DC

## 2020-08-11 MED ORDER — FUROSEMIDE 20 MG PO TABS: ORAL_TABLET | ORAL | 0 refills | Status: DC

## 2020-08-11 MED ORDER — DOFETILIDE 500 MCG PO CAPS: 500.0000 ug | ORAL_CAPSULE | Freq: Two times a day (BID) | ORAL | 0 refills | Status: DC

## 2020-08-11 MED ORDER — DILTIAZEM HCL ER COATED BEADS 120 MG PO CP24: ORAL_CAPSULE | ORAL | 0 refills | Status: DC

## 2020-08-11 MED ORDER — ISOSORBIDE MONONITRATE ER 30 MG PO TB24: 30.0000 mg | ORAL_TABLET | Freq: Every day | ORAL | 0 refills | Status: DC

## 2020-08-11 NOTE — Telephone Encounter (Signed)
Called pt to inquire about which medications that pt needed and pt stated that he was told that [facility] has a new pharmacy, [facility]. I explained to the pt, that [facility] has [place] and [place], but I could send his medications to which ever pharmacy the pt wanted it sent to. Pt was upset and stated that he would call the pharmacist the told him this and take care of it and hung up.

## 2020-08-11 NOTE — Telephone Encounter (Signed)
Pt last saw Roderic Palau, NP on 08/07/20, last labs 08/07/20 Creat 0.98, age 87, weight 102.5kg, based on specified criteria pt is on appropriate dosage of Eliquis 5mg  BID.  Will refill rx.

## 2020-08-11 NOTE — Telephone Encounter (Signed)
Patients needs all meds prescribe by Dr Caryl Comes to be filled. Patients has appt set for May 19,2022, patient is asking that meds be prescribe enough to last him to his up coming appt. Patient didn't feel comfortable with trying the virtual visit that was open on 3/31. Please advise

## 2020-08-12 ENCOUNTER — Telehealth: Payer: Self-pay | Admitting: Internal Medicine

## 2020-08-12 ENCOUNTER — Other Ambulatory Visit: Payer: Self-pay

## 2020-08-12 ENCOUNTER — Other Ambulatory Visit: Payer: Self-pay | Admitting: *Deleted

## 2020-08-12 MED ORDER — PANTOPRAZOLE SODIUM 40 MG PO TBEC: 40.0000 mg | DELAYED_RELEASE_TABLET | Freq: Every day | ORAL | 1 refills | Status: DC

## 2020-08-12 MED ORDER — DILTIAZEM HCL ER COATED BEADS 120 MG PO CP24: ORAL_CAPSULE | ORAL | 1 refills | Status: DC

## 2020-08-12 MED ORDER — DOFETILIDE 500 MCG PO CAPS: 500.0000 ug | ORAL_CAPSULE | Freq: Two times a day (BID) | ORAL | 1 refills | Status: DC

## 2020-08-12 MED ORDER — APIXABAN 5 MG PO TABS: 5.0000 mg | ORAL_TABLET | Freq: Two times a day (BID) | ORAL | 6 refills | Status: DC

## 2020-08-12 MED ORDER — METOPROLOL TARTRATE 25 MG PO TABS: 25.0000 mg | ORAL_TABLET | Freq: Two times a day (BID) | ORAL | 1 refills | Status: DC

## 2020-08-12 MED ORDER — ISOSORBIDE MONONITRATE ER 30 MG PO TB24: 30.0000 mg | ORAL_TABLET | Freq: Every day | ORAL | 1 refills | Status: DC

## 2020-08-12 MED ORDER — FUROSEMIDE 20 MG PO TABS: ORAL_TABLET | ORAL | 1 refills | Status: DC

## 2020-08-12 NOTE — Telephone Encounter (Signed)
Pt has an upcoming appt with Dr. Caryl Comes on 10/01/20 and requesting more medication of [facility]t time. Please address

## 2020-08-12 NOTE — Progress Notes (Signed)
A call was made to Dr. Vena Rua office to ask for a refill to be sent to [facility]. Spoke with Arrie Aran who took the message to send to Dr. Hilarie Fredrickson nurse. Refill may be sent on 08/13/20.   [place] Pharmacist Assistant [phone]

## 2020-08-12 NOTE — Telephone Encounter (Signed)
Prescription refill request for Eliquis received. Indication: A fib Last office visit:  08/07/20 Scr:  0.98 on 08/07/20 Age: 87 Weight:  102.5kg  Based on above findings Eliquis 5mg  twice daily is the appropriate dose.  Refill approved.

## 2020-08-12 NOTE — Telephone Encounter (Signed)
Olivia Mackie from [facility] called to refill pantoprazole

## 2020-08-12 NOTE — Telephone Encounter (Signed)
Rx sent 

## 2020-08-14 ENCOUNTER — Encounter: Payer: Self-pay | Admitting: Internal Medicine

## 2020-08-17 DIAGNOSIS — G4733 Obstructive sleep apnea (adult) (pediatric): Secondary | ICD-10-CM | POA: Diagnosis not present

## 2020-08-18 ENCOUNTER — Ambulatory Visit (INDEPENDENT_AMBULATORY_CARE_PROVIDER_SITE_OTHER): Payer: PPO

## 2020-08-18 DIAGNOSIS — I495 Sick sinus syndrome: Secondary | ICD-10-CM | POA: Diagnosis not present

## 2020-08-19 ENCOUNTER — Telehealth: Payer: Self-pay | Admitting: Pharmacist

## 2020-08-19 LAB — CUP PACEART REMOTE DEVICE CHECK
Battery Remaining Longevity: 114 mo
Battery Remaining Percentage: 95.5 %
Battery Voltage: 2.99 V
Brady Statistic AP VP Percent: 1 %
Brady Statistic AP VS Percent: 99 %
Brady Statistic AS VP Percent: 1 %
Brady Statistic AS VS Percent: 1 %
Brady Statistic RA Percent Paced: 99 %
Brady Statistic RV Percent Paced: 1 %
Date Time Interrogation Session: 20220405020014
Implantable Lead Implant Date: 20171002
Implantable Lead Implant Date: 20171002
Implantable Lead Location: 753859
Implantable Lead Location: 753860
Implantable Pulse Generator Implant Date: 20171002
Lead Channel Impedance Value: 380 Ohm
Lead Channel Impedance Value: 540 Ohm
Lead Channel Pacing Threshold Amplitude: 0.625 V
Lead Channel Pacing Threshold Amplitude: 0.75 V
Lead Channel Pacing Threshold Pulse Width: 0.5 ms
Lead Channel Pacing Threshold Pulse Width: 0.5 ms
Lead Channel Sensing Intrinsic Amplitude: 12 mV
Lead Channel Sensing Intrinsic Amplitude: 4.4 mV
Lead Channel Setting Pacing Amplitude: 1.625
Lead Channel Setting Pacing Amplitude: 2.5 V
Lead Channel Setting Pacing Pulse Width: 0.5 ms
Lead Channel Setting Sensing Sensitivity: 2 mV
Pulse Gen Model: 2272
Pulse Gen Serial Number: 7951215

## 2020-08-19 NOTE — Progress Notes (Signed)
    Chronic Care Management Pharmacy Assistant   Name: Donald [name]  MRN: [id number] DOB: 1933-04-12  Reason for Encounter: Medication Coordination Review    Recent office visits:  None ID  Recent consult visits:  None ID  Hospital visits:  None in previous 6 months  Medications: Outpatient Encounter Medications as of 08/19/2020  Medication Sig  . rosuvastatin (CRESTOR) 10 MG tablet Take 1 tablet (10 mg total) by mouth daily.  Marland Kitchen apixaban (ELIQUIS) 5 MG TABS tablet Take 1 tablet (5 mg total) by mouth 2 (two) times daily.  Marland Kitchen diltiazem (CARDIZEM CD) 120 MG 24 hr capsule TAKE (1) CAPSULE TWICE DAILY.  Marland Kitchen dofetilide (TIKOSYN) 500 MCG capsule Take 1 capsule (500 mcg total) by mouth 2 (two) times daily. Please keep upcoming appt in May 2022 with Dr. Caryl Comes before anymore refills. Thank you  . ferrous sulfate 325 (65 FE) MG tablet Take 1 tablet (325 mg total) by mouth daily with breakfast.  . FIBER PO Take by mouth.  . furosemide (LASIX) 20 MG tablet TAKE 1 TABLET EACH DAY.  . isosorbide mononitrate (IMDUR) 30 MG 24 hr tablet Take 1 tablet (30 mg total) by mouth daily.  . Magnesium Aspartate 65 MG TABS Take 1 tablet by mouth daily.  . metoprolol tartrate (LOPRESSOR) 25 MG tablet Take 1 tablet (25 mg total) by mouth 2 (two) times daily.  . metroNIDAZOLE (METROGEL) 0.75 % gel Apply 1 application topically daily as needed (for rosacea).   . Multiple Vitamins-Minerals (EYE VITAMINS) CAPS Take 1 capsule by mouth 2 (two) times daily.  . Multiple Vitamins-Minerals (MULTIVITAMIN PO) Take 1 tablet by mouth 2 (two) times daily.  . nitroGLYCERIN (NITROSTAT) 0.4 MG SL tablet PLACE 1 TABLET UNDER THE TONGUE EVERY 5 MINUTES AS NEEDED FOR CHEST PAIN, MAY REPEAT FOR 3 DOSES.  Marland Kitchen pantoprazole (PROTONIX) 40 MG tablet Take 1 tablet (40 mg total) by mouth daily.  . Probiotic Product (ALIGN PO) Take by mouth.   No facility-administered encounter medications on file as of 08/19/2020.    Reviewed chart for  medication changes ahead of medication coordination call.  No OVs, Consults, or hospital visits since last care coordination .  No medication changes indicated   BP Readings from Last 3 Encounters:  08/07/20 112/78  06/04/20 118/68  04/28/20 120/72    No results found for: HGBA1C   Patient obtains medications through Vials  30 Days    Patient is due for next adherence delivery on: 08/20/20. Called patient and reviewed medications and coordinated delivery.  This delivery to include: Metoprolol Tartrate 25 mg take 1 tab by mouth twice daily Nitroglycerin Sol 0.4 mg dissolved 1 tab under the tongue every 5 minutes as needed for chest pain   Confirmed delivery date of 08/20/20, advised patient that pharmacy will contact them the morning of delivery.   [place] Pharmacist Assistant [phone]   Time spent:26

## 2020-08-27 ENCOUNTER — Other Ambulatory Visit: Payer: Self-pay

## 2020-08-31 ENCOUNTER — Ambulatory Visit (INDEPENDENT_AMBULATORY_CARE_PROVIDER_SITE_OTHER): Payer: PPO | Admitting: Internal Medicine

## 2020-08-31 ENCOUNTER — Encounter: Payer: Self-pay | Admitting: Internal Medicine

## 2020-08-31 ENCOUNTER — Other Ambulatory Visit (INDEPENDENT_AMBULATORY_CARE_PROVIDER_SITE_OTHER): Payer: PPO

## 2020-08-31 ENCOUNTER — Telehealth: Payer: Self-pay | Admitting: Pharmacist

## 2020-08-31 ENCOUNTER — Other Ambulatory Visit: Payer: Self-pay

## 2020-08-31 VITALS — BP 122/80 | HR 60 | Temp 98.0°F | Ht 68.0 in | Wt 226.0 lb

## 2020-08-31 DIAGNOSIS — I700 Atherosclerosis of aorta: Secondary | ICD-10-CM

## 2020-08-31 DIAGNOSIS — E785 Hyperlipidemia, unspecified: Secondary | ICD-10-CM

## 2020-08-31 DIAGNOSIS — D500 Iron deficiency anemia secondary to blood loss (chronic): Secondary | ICD-10-CM

## 2020-08-31 DIAGNOSIS — I10 Essential (primary) hypertension: Secondary | ICD-10-CM | POA: Diagnosis not present

## 2020-08-31 LAB — CBC WITH DIFFERENTIAL/PLATELET
Basophils Absolute: 0.1 10*3/uL (ref 0.0–0.1)
Basophils Relative: 0.7 % (ref 0.0–3.0)
Eosinophils Absolute: 0.3 10*3/uL (ref 0.0–0.7)
Eosinophils Relative: 3.3 % (ref 0.0–5.0)
HCT: 38 % — ABNORMAL LOW (ref 39.0–52.0)
Hemoglobin: 12.8 g/dL — ABNORMAL LOW (ref 13.0–17.0)
Lymphocytes Relative: 20.3 % (ref 12.0–46.0)
Lymphs Abs: 1.6 10*3/uL (ref 0.7–4.0)
MCHC: 33.7 g/dL (ref 30.0–36.0)
MCV: 97.6 fl (ref 78.0–100.0)
Monocytes Absolute: 0.8 10*3/uL (ref 0.1–1.0)
Monocytes Relative: 9.5 % (ref 3.0–12.0)
Neutro Abs: 5.4 10*3/uL (ref 1.4–7.7)
Neutrophils Relative %: 66.2 % (ref 43.0–77.0)
Platelets: 209 10*3/uL (ref 150.0–400.0)
RBC: 3.89 Mil/uL — ABNORMAL LOW (ref 4.22–5.81)
RDW: 14 % (ref 11.5–15.5)
WBC: 8.1 10*3/uL (ref 4.0–10.5)

## 2020-08-31 LAB — IBC + FERRITIN
Ferritin: 28.4 ng/mL (ref 22.0–322.0)
Iron: 207 ug/dL — ABNORMAL HIGH (ref 42–165)
Saturation Ratios: 63.2 % — ABNORMAL HIGH (ref 20.0–50.0)
Transferrin: 234 mg/dL (ref 212.0–360.0)

## 2020-08-31 NOTE — Progress Notes (Signed)
Subjective:  Patient ID: Donald [name], male    DOB: [birth year]  Age: 87 y.o. MRN: [id number]  CC: Atrial Fibrillation and Hyperlipidemia  This visit occurred during the SARS-CoV-2 public health emergency.  Safety protocols were in place, including screening questions prior to the visit, additional usage of staff PPE, and extensive cleaning of exam room while observing appropriate contact time as indicated for disinfecting solutions.    HPI Donald [name] presents for f/up -  He has felt recently and offers no complains.  Outpatient Medications Prior to Visit  Medication Sig Dispense Refill  . apixaban (ELIQUIS) 5 MG TABS tablet Take 1 tablet (5 mg total) by mouth 2 (two) times daily. 60 tablet 6  . diltiazem (CARDIZEM CD) 120 MG 24 hr capsule TAKE (1) CAPSULE TWICE DAILY. 60 capsule 1  . dofetilide (TIKOSYN) 500 MCG capsule Take 1 capsule (500 mcg total) by mouth 2 (two) times daily. Please keep upcoming appt in May 2022 with Dr. Caryl [name] before anymore refills. Thank you 60 capsule 1  . ferrous sulfate 325 (65 FE) MG tablet Take 1 tablet (325 mg total) by mouth daily with breakfast. 60 tablet 3  . FIBER PO Take by mouth.    . furosemide (LASIX) 20 MG tablet TAKE 1 TABLET EACH DAY. 30 tablet 1  . isosorbide mononitrate (IMDUR) 30 MG 24 hr tablet Take 1 tablet (30 mg total) by mouth daily. 30 tablet 1  . Magnesium Aspartate 65 MG TABS Take 1 tablet by mouth daily.    . metoprolol tartrate (LOPRESSOR) 25 MG tablet Take 1 tablet (25 mg total) by mouth 2 (two) times daily. 60 tablet 1  . metroNIDAZOLE (METROGEL) 0.75 % gel Apply 1 application topically daily as needed (for rosacea).     . Multiple Vitamins-Minerals (EYE VITAMINS) CAPS Take 1 capsule by mouth 2 (two) times daily.    . Multiple Vitamins-Minerals (MULTIVITAMIN PO) Take 1 tablet by mouth 2 (two) times daily.    . nitroGLYCERIN (NITROSTAT) 0.4 MG SL tablet PLACE 1 TABLET UNDER THE TONGUE EVERY 5 MINUTES AS NEEDED FOR CHEST PAIN,  MAY REPEAT FOR 3 DOSES. 12 tablet 0  . pantoprazole (PROTONIX) 40 MG tablet Take 1 tablet (40 mg total) by mouth daily. 90 tablet 1  . Probiotic Product (ALIGN PO) Take by mouth.    . rosuvastatin (CRESTOR) 10 MG tablet Take 1 tablet (10 mg total) by mouth daily. 90 tablet 1   No facility-administered medications prior to visit.    ROS Review of Systems  Constitutional: Negative for chills, diaphoresis, fatigue and fever.  HENT: Negative.   Eyes: Negative.   Cardiovascular: Negative for chest pain, palpitations and leg swelling.  Gastrointestinal: Negative for abdominal pain, constipation, diarrhea and nausea.  Endocrine: Negative.   Genitourinary: Negative.  Negative for difficulty urinating.  Musculoskeletal: Negative for arthralgias and myalgias.  Skin: Negative.   Neurological: Negative.  Negative for dizziness, weakness and light-headedness.  Hematological: Negative for adenopathy. Does not bruise/bleed easily.  Psychiatric/Behavioral: Negative.     Objective:  BP 122/80 (BP Location: Left Arm, Patient Position: Sitting, Cuff Size: Large)   Pulse 60   Temp 98 F (36.7 C) (Oral)   Ht 5\' 8"  (1.727 m)   Wt 226 lb (102.5 kg)   SpO2 96%   BMI 34.36 kg/m   BP Readings from Last 3 Encounters:  08/31/20 122/80  08/07/20 112/78  06/04/20 118/68    Wt Readings from Last 3 Encounters:  08/31/20 226 lb (  102.5 kg)  08/07/20 226 lb (102.5 kg)  06/04/20 224 lb 12.8 oz (102 kg)    Physical Exam Vitals reviewed.  Constitutional:      Appearance: He is obese. He is not ill-appearing.  HENT:     Nose: Nose normal.     Mouth/Throat:     Mouth: Mucous membranes are moist.  Eyes:     General: No scleral icterus.    Conjunctiva/sclera: Conjunctivae normal.  Cardiovascular:     Rate and Rhythm: Normal rate and regular rhythm.     Heart sounds: No murmur heard.   Pulmonary:     Effort: Pulmonary effort is normal.     Breath sounds: No stridor. No wheezing, rhonchi or  rales.  Abdominal:     General: Abdomen is protuberant. Bowel sounds are normal. There is no distension.     Palpations: Abdomen is soft. There is no fluid wave, hepatomegaly, splenomegaly or mass.  Musculoskeletal:        General: Normal range of motion.     Cervical back: Neck supple.     Right lower leg: No edema.     Left lower leg: No edema.  Lymphadenopathy:     Cervical: No cervical adenopathy.  Skin:    General: Skin is warm and dry.     Coloration: Skin is not pale.  Neurological:     General: No focal deficit present.     Mental Status: He is alert and oriented to person, place, and time. Mental status is at baseline.  Psychiatric:        Mood and Affect: Mood normal.        Behavior: Behavior normal.     Lab Results  Component Value Date   WBC 8.1 08/31/2020   HGB 12.8 (L) 08/31/2020   HCT 38.0 (L) 08/31/2020   PLT 209.0 08/31/2020   GLUCOSE 93 08/07/2020   CHOL 185 03/05/2020   TRIG 71.0 03/05/2020   HDL 47.20 03/05/2020   LDLDIRECT 138.8 04/18/2011   LDLCALC 124 (H) 03/05/2020   ALT 17 03/05/2020   AST 20 03/05/2020   NA 138 08/07/2020   K 4.6 08/07/2020   CL 105 08/07/2020   CREATININE 0.98 08/07/2020   BUN 18 08/07/2020   CO2 26 08/07/2020   TSH 0.65 03/05/2020   PSA 0.83 11/20/2013   INR 1.52 06/08/2017    No results found.  Assessment & Plan:   Donald [name] was seen today for atrial fibrillation and hyperlipidemia.  Diagnoses and all orders for this visit:  Atherosclerosis of aorta ([place])- Risk factor modifications addressed.  Essential hypertension- His BP is well controlled.  Hyperlipidemia with target LDL less than 100- LDL goal achieved. Doing well on the statin  Iron deficiency anemia due to chronic blood loss- His H/H are low but his iron level is normal.   I am having Donald Sorrow "Yvone Neu" maintain his ferrous sulfate, Multiple Vitamins-Minerals (MULTIVITAMIN PO), metroNIDAZOLE, Eye Vitamins, FIBER PO, Probiotic Product (ALIGN PO),  Magnesium Aspartate, nitroGLYCERIN, rosuvastatin, furosemide, diltiazem, metoprolol tartrate, isosorbide mononitrate, apixaban, dofetilide, and pantoprazole.  No orders of the defined types were placed in this encounter.    Follow-up: No follow-ups on file.  Donald Calico, MD

## 2020-08-31 NOTE — Progress Notes (Addendum)
    Chronic Care Management Pharmacy Assistant   Name: Donald [name]  MRN: [id number] DOB: 05-24-1932   Reason for Encounter:Medication Coordination Call    Recent office visits:  08/31/20 Dr. [name]  Recent consult visits:  None ID  Hospital visits:  None in previous 6 months  Medications: Outpatient Encounter Medications as of 08/31/2020  Medication Sig   apixaban (ELIQUIS) 5 MG TABS tablet Take 1 tablet (5 mg total) by mouth 2 (two) times daily.   diltiazem (CARDIZEM CD) 120 MG 24 hr capsule TAKE (1) CAPSULE TWICE DAILY.   dofetilide (TIKOSYN) 500 MCG capsule Take 1 capsule (500 mcg total) by mouth 2 (two) times daily. Please keep upcoming appt in May 2022 with Dr. Caryl Comes before anymore refills. Thank you   ferrous sulfate 325 (65 FE) MG tablet Take 1 tablet (325 mg total) by mouth daily with breakfast.   FIBER PO Take by mouth.   furosemide (LASIX) 20 MG tablet TAKE 1 TABLET EACH DAY.   isosorbide mononitrate (IMDUR) 30 MG 24 hr tablet Take 1 tablet (30 mg total) by mouth daily.   Magnesium Aspartate 65 MG TABS Take 1 tablet by mouth daily.   metoprolol tartrate (LOPRESSOR) 25 MG tablet Take 1 tablet (25 mg total) by mouth 2 (two) times daily.   metroNIDAZOLE (METROGEL) 0.75 % gel Apply 1 application topically daily as needed (for rosacea).    Multiple Vitamins-Minerals (EYE VITAMINS) CAPS Take 1 capsule by mouth 2 (two) times daily.   Multiple Vitamins-Minerals (MULTIVITAMIN PO) Take 1 tablet by mouth 2 (two) times daily.   nitroGLYCERIN (NITROSTAT) 0.4 MG SL tablet PLACE 1 TABLET UNDER THE TONGUE EVERY 5 MINUTES AS NEEDED FOR CHEST PAIN, MAY REPEAT FOR 3 DOSES.   pantoprazole (PROTONIX) 40 MG tablet Take 1 tablet (40 mg total) by mouth daily.   Probiotic Product (ALIGN PO) Take by mouth.   rosuvastatin (CRESTOR) 10 MG tablet Take 1 tablet (10 mg total) by mouth daily.   No facility-administered encounter medications on file as of 08/31/2020.    Received call from  patient regarding medication management via [facility].  Patient requested an acute fill for Preservision Areds to be delivered: 09/02/20 Pharmacy needs refills? No  Confirmed delivery date of 09/02/20, advised patient that pharmacy will contact them the morning of delivery.  [place] Pharmacist Assistant [phone]

## 2020-08-31 NOTE — Progress Notes (Signed)
Remote pacemaker transmission.   

## 2020-09-02 ENCOUNTER — Encounter: Payer: Self-pay | Admitting: Internal Medicine

## 2020-09-02 NOTE — Patient Instructions (Signed)
[employer] medicine (25th ed., pp. [phone]). [place], [place]: Elsevier.">  Anemia  Anemia is a condition in which there is not enough red blood cells or hemoglobin in the blood. Hemoglobin is a substance in red blood cells that carries oxygen. When you do not have enough red blood cells or hemoglobin (are anemic), your body cannot get enough oxygen and your organs may not work properly. As a result, you may feel very tired or have other problems. What are the causes? Common causes of anemia include:  Excessive bleeding. Anemia can be caused by excessive bleeding inside or outside the body, including bleeding from the intestines or from heavy menstrual periods in females.  Poor nutrition.  Long-lasting (chronic) kidney, thyroid, and liver disease.  Bone marrow disorders, spleen problems, and blood disorders.  Cancer and treatments for cancer.  HIV (human immunodeficiency virus) and AIDS (acquired immunodeficiency syndrome).  Infections, medicines, and autoimmune disorders that destroy red blood cells. What are the signs or symptoms? Symptoms of this condition include:  Minor weakness.  Dizziness.  Headache, or difficulties concentrating and sleeping.  Heartbeats that feel irregular or faster than normal (palpitations).  Shortness of breath, especially with exercise.  Pale skin, lips, and nails, or cold hands and feet.  Indigestion and nausea. Symptoms may occur suddenly or develop slowly. If your anemia is mild, you may not have symptoms. How is this diagnosed? This condition is diagnosed based on blood tests, your medical history, and a physical exam. In some cases, a test may be needed in which cells are removed from the soft tissue inside of a bone and looked at under a microscope (bone marrow biopsy). Your health care provider may also check your stool (feces) for blood and may do additional testing to look for the cause of your bleeding. Other tests may  include:  Imaging tests, such as a CT scan or MRI.  A procedure to see inside your esophagus and stomach (endoscopy).  A procedure to see inside your colon and rectum (colonoscopy). How is this treated? Treatment for this condition depends on the cause. If you continue to lose a lot of blood, you may need to be treated at a hospital. Treatment may include:  Taking supplements of iron, vitamin Q68, or folic acid.  Taking a hormone medicine (erythropoietin) that can help to stimulate red blood cell growth.  Having a blood transfusion. This may be needed if you lose a lot of blood.  Making changes to your diet.  Having surgery to remove your spleen. Follow these instructions at home:  Take over-the-counter and prescription medicines only as told by your health care provider.  Take supplements only as told by your health care provider.  Follow any diet instructions that you were given by your health care provider.  Keep all follow-up visits as told by your health care provider. This is important. Contact a health care provider if:  You develop new bleeding anywhere in the body. Get help right away if:  You are very weak.  You are short of breath.  You have pain in your abdomen or chest.  You are dizzy or feel faint.  You have trouble concentrating.  You have bloody stools, black stools, or tarry stools.  You vomit repeatedly or you vomit up blood. These symptoms may represent a serious problem that is an emergency. Do not wait to see if the symptoms will go away. Get medical help right away. Call your local emergency services (911 in the [place] Do not  drive yourself to the hospital. Summary  Anemia is a condition in which you do not have enough red blood cells or enough of a substance in your red blood cells that carries oxygen (hemoglobin).  Symptoms may occur suddenly or develop slowly.  If your anemia is mild, you may not have symptoms.  This condition is  diagnosed with blood tests, a medical history, and a physical exam. Other tests may be needed.  Treatment for this condition depends on the cause of the anemia. This information is not intended to replace advice given to you by your health care provider. Make sure you discuss any questions you have with your health care provider. Document Revised: 04/09/2019 Document Reviewed: 04/09/2019 Elsevier Patient Education  2021 Elsevier Inc.  

## 2020-09-05 ENCOUNTER — Other Ambulatory Visit: Payer: Self-pay | Admitting: Internal Medicine

## 2020-09-07 ENCOUNTER — Telehealth: Payer: Self-pay | Admitting: Pharmacist

## 2020-09-07 ENCOUNTER — Encounter: Payer: Self-pay | Admitting: Internal Medicine

## 2020-09-07 DIAGNOSIS — G4733 Obstructive sleep apnea (adult) (pediatric): Secondary | ICD-10-CM | POA: Diagnosis not present

## 2020-09-07 NOTE — Progress Notes (Signed)
    Chronic Care Management Pharmacy Assistant   Name: Donald [name]  MRN: [id number] DOB: 07-27-1932   Reason for Encounter: Medication Coordination Call    Recent office visits:  None ID  Recent consult visits:  None ID  Hospital visits:  None in previous 6 months  Medications: Outpatient Encounter Medications as of 09/07/2020  Medication Sig  . apixaban (ELIQUIS) 5 MG TABS tablet Take 1 tablet (5 mg total) by mouth 2 (two) times daily.  Marland Kitchen diltiazem (CARDIZEM CD) 120 MG 24 hr capsule TAKE (1) CAPSULE TWICE DAILY.  Marland Kitchen dofetilide (TIKOSYN) 500 MCG capsule Take 1 capsule (500 mcg total) by mouth 2 (two) times daily. Please keep upcoming appt in May 2022 with Dr. Caryl Comes before anymore refills. Thank you  . ferrous sulfate 325 (65 FE) MG tablet Take 1 tablet (325 mg total) by mouth daily with breakfast.  . FIBER PO Take by mouth.  . furosemide (LASIX) 20 MG tablet TAKE 1 TABLET EACH DAY.  . isosorbide mononitrate (IMDUR) 30 MG 24 hr tablet Take 1 tablet (30 mg total) by mouth daily.  . Magnesium Aspartate 65 MG TABS Take 1 tablet by mouth daily.  . metoprolol tartrate (LOPRESSOR) 25 MG tablet Take 1 tablet (25 mg total) by mouth 2 (two) times daily.  . metroNIDAZOLE (METROGEL) 0.75 % gel Apply 1 application topically daily as needed (for rosacea).   . Multiple Vitamins-Minerals (EYE VITAMINS) CAPS Take 1 capsule by mouth 2 (two) times daily.  . Multiple Vitamins-Minerals (MULTIVITAMIN PO) Take 1 tablet by mouth 2 (two) times daily.  . nitroGLYCERIN (NITROSTAT) 0.4 MG SL tablet PLACE 1 TABLET UNDER THE TONGUE EVERY 5 MINUTES AS NEEDED FOR CHEST PAIN, MAY REPEAT FOR 3 DOSES.  Marland Kitchen pantoprazole (PROTONIX) 40 MG tablet Take 1 tablet (40 mg total) by mouth daily.  . Probiotic Product (ALIGN PO) Take by mouth.  . rosuvastatin (CRESTOR) 10 MG tablet Take 1 tablet (10 mg total) by mouth daily.   No facility-administered encounter medications on file as of 09/07/2020.    Received call from  patient regarding medication management via [facility].  Patient requested an acute fill for Eliquis to be delivered: 09/09/20 Pharmacy needs refills?No  Confirmed delivery date of 09/09/20, advised patient that pharmacy will contact them the morning of delivery.   Ethelene Hal Clinical Pharmacist Assistant [phone]  Time spent:10

## 2020-09-09 ENCOUNTER — Encounter: Payer: Self-pay | Admitting: Internal Medicine

## 2020-09-09 NOTE — Telephone Encounter (Signed)
Delivery set up for 4/28

## 2020-09-11 ENCOUNTER — Encounter: Payer: Self-pay | Admitting: Internal Medicine

## 2020-09-12 DIAGNOSIS — G4733 Obstructive sleep apnea (adult) (pediatric): Secondary | ICD-10-CM | POA: Diagnosis not present

## 2020-09-14 ENCOUNTER — Telehealth: Payer: Self-pay | Admitting: Pharmacist

## 2020-09-14 ENCOUNTER — Other Ambulatory Visit: Payer: Self-pay | Admitting: Internal Medicine

## 2020-09-14 NOTE — Telephone Encounter (Signed)
Contacted patient to discuss medication management.  Reviewed patient's [facility] medication and Epic medication profile assuring there are no discrepancies or gaps in therapy. Confirmed all fill dates appropriate and verified with patient that there is a sufficient quantity of all prescribed medications at home. Informed patient to call me any time if needing medications before scheduled deliveries. The anticipated medication sync date is 11/04/20.  Patient agreed to sync plan - will refill metoprolol, furosemide, isosorbide, and pantoprazole this week with enough supply until sync date. Will refill Eliquis at end of month with enough supply untl sync date.

## 2020-09-15 ENCOUNTER — Other Ambulatory Visit: Payer: Self-pay | Admitting: Internal Medicine

## 2020-09-17 MED ORDER — METOPROLOL TARTRATE 25 MG PO TABS: 25.0000 mg | ORAL_TABLET | Freq: Two times a day (BID) | ORAL | 5 refills | Status: DC

## 2020-09-17 MED ORDER — ISOSORBIDE MONONITRATE ER 30 MG PO TB24: 30.0000 mg | ORAL_TABLET | Freq: Every day | ORAL | 5 refills | Status: DC

## 2020-09-17 NOTE — Addendum Note (Signed)
Addended by: Carter Kitten D on: 09/17/2020 11:47 AM   Modules accepted: Orders

## 2020-09-21 ENCOUNTER — Encounter: Payer: Self-pay | Admitting: Internal Medicine

## 2020-10-01 ENCOUNTER — Ambulatory Visit: Payer: PPO | Admitting: Internal Medicine

## 2020-10-01 ENCOUNTER — Encounter: Payer: Self-pay | Admitting: Internal Medicine

## 2020-10-01 ENCOUNTER — Other Ambulatory Visit: Payer: Self-pay

## 2020-10-01 VITALS — BP 130/72 | HR 60 | Ht 68.0 in | Wt 223.4 lb

## 2020-10-01 DIAGNOSIS — Z950 Presence of cardiac pacemaker: Secondary | ICD-10-CM | POA: Diagnosis not present

## 2020-10-01 DIAGNOSIS — I495 Sick sinus syndrome: Secondary | ICD-10-CM

## 2020-10-01 DIAGNOSIS — I480 Paroxysmal atrial fibrillation: Secondary | ICD-10-CM

## 2020-10-01 MED ORDER — ISOSORBIDE MONONITRATE ER 60 MG PO TB24: 60.0000 mg | ORAL_TABLET | Freq: Every day | ORAL | 3 refills | Status: DC

## 2020-10-01 MED ORDER — ROSUVASTATIN CALCIUM 20 MG PO TABS: 20.0000 mg | ORAL_TABLET | Freq: Every day | ORAL | 3 refills | Status: DC

## 2020-10-01 NOTE — Patient Instructions (Signed)
Medication Instructions:  Your physician has recommended you make the following change in your medication:   ** Increase your Furosemide to 40mg  every other day x 5 doses then return to your original dosing.  ** Increase your Isosorbide to 60mg  daily  ** Increase your Rosuvastatin to 20mg    *If you need a refill on your cardiac medications before your next appointment, please call your pharmacy*   Lab Work: None ordered.  If you have labs (blood work) drawn today and your tests are completely normal, you will receive your results only by: Marland Kitchen MyChart Message (if you have MyChart) OR . A paper copy in the mail If you have any lab test that is abnormal or we need to change your treatment, we will call you to review the results.   Testing/Procedures: None ordered.    Follow-Up: At [facility], you and your health needs are our priority.  As part of our continuing mission to provide you with exceptional heart care, we have created designated Provider Care Teams.  These Care Teams include your primary Cardiologist (physician) and Advanced Practice Providers (APPs -  Physician Assistants and Nurse Practitioners) who all work together to provide you with the care you need, when you need it.  We recommend signing up for the patient portal called "MyChart".  Sign up information is provided on this After Visit Summary.  MyChart is used to connect with patients for Virtual Visits (Telemedicine).  Patients are able to view lab/test results, encounter notes, upcoming appointments, etc.  Non-urgent messages can be sent to your provider as well.   To learn more about what you can do with MyChart, go to [email or web address].    Your next appointment:   6 month(s)  The format for your next appointment:   In Person  Provider:   Virl Axe, MD

## 2020-10-01 NOTE — Progress Notes (Signed)
Patient Care Team: Janith Lima, MD as PCP - General (Internal Medicine) Deboraha Sprang, MD as PCP - Cardiology (Cardiology) Franchot Gallo, MD (Urology) [name], [name], MD (Dermatology) Monna Fam, MD as Consulting Physician (Ophthalmology) Deboraha Sprang, MD as Consulting Physician (Cardiology) Pyrtle, Lajuan Lines, MD as Consulting Physician (Gastroenterology) Brand Males, MD as Consulting Physician (Pulmonary Disease) Charlton Haws, [facility] as Pharmacist (Pharmacist)   HPI  Donald [name] is a 87 y.o. 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 ([employer] Jude).  Anticoagulated with apixaban with only superficial bleeding.  Rhythm control with dofetilide since 1/20  The patient denies*, nocturnal dyspnea, orthopnea, mild  peripheral edema .  There have been no palpitations, lightheadedness or syncope.  Complains of  chest pain exertional, pressure relieved by rest, associated with worsening shortness of breath with exertion.  His LDL at recent check was over 100.  His statin was changed to rosuvastatin.     Having night cramps which is treated with Pedialyte       DATE TEST EF   10/15    Echo   EF 55 %  mild AS ( mean gradient 10) --? Bicuspid aortic valve  9/16 myoview EF 60-65%   mild ischemia  10/17 Echo EF 60-65%  No comment on bicuspid valve//mild - Mod  AI   11/18 Cath EF 55-65% Mod non obstructive CAD   1/20 Echo 60-65% LAE (52/2.5/45)     Date CR K Mg Hgb TSH  10/17 0.97  4.6  13.2   0.56  4/18 1.07 5.2  13.9   1/19 1.26 5.1  14 0.46  1/20 1.04 4.7 2.1 13.6   10/20 0.97 4.8 2.0 14.0 5.39     thromboembolic risk factors ( age  -2, HTN-1, ) for a CHADSVASc Score of 3    P   Past Medical History:  Diagnosis Date  . Abnormal CT scan, stomach 02/2014   Thickening of gastric fundus and cardia.  gastritis on EGD 03/2014  . Allergic rhinitis due to pollen   . Arthropathy,  unspecified, site unspecified   . Atherosclerosis    a. Noted by abdominal CT 02/2014 (h/o normal nuc 2011).  . Atrial flutter ([place]) 02/24/14   s/p ablation 11/15  . Cardiomyopathy ([place])    tachy mediated - a. TEE (10/15):  EF 30%;  b. Echo after NSR restored (10/15):  mild LVH, EF 55-60%, mild AS, mild AI, mild MR, mild to mod LAE, mild RAE  . Diverticulosis    severe in descending, sigmoid colon.   . ED (erectile dysfunction)   . Esophageal stricture 03/2014   traversable with endoscope. not dilated.   . Gastric AVM   . GERD (gastroesophageal reflux disease) 03/2014   small HH and gastritis on EGD  . GI bleed   . Habitual alcohol use   . Hemorrhoids   . Hiatal hernia   . Hyperlipidemia   . Hypertension   . Obesity   . OSA on CPAP   . PAF (paroxysmal atrial fibrillation) ([place])   . Prostatitis, unspecified   . 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 Skin cancer    basal and squamous cell    Past Surgical History:  Procedure Laterality Date  . ATRIAL FLUTTER ABLATION N/A 03/19/2014   RFCA of atrial flutter by Dr Caryl Comes  . CARDIOVERSION N/A 02/24/2014   Procedure: CARDIOVERSION;  Surgeon: Lelon Perla, MD;  Location: [facility] ENDOSCOPY;  Service: Cardiovascular;  Laterality: N/A;  . CARDIOVERSION Right 09/10/2014   Procedure: CARDIOVERSION;  Surgeon: Deboraha Sprang, MD;  Location: [facility] CATH LAB;  Service: Cardiovascular;  Laterality: Right;  . CARDIOVERSION N/A 01/22/2018   Procedure: CARDIOVERSION;  Surgeon: Fay Records, MD;  Location: [facility] ENDOSCOPY;  Service: Cardiovascular;  Laterality: N/A;  . COLONOSCOPY N/A 04/23/2015   Procedure: COLONOSCOPY;  Surgeon: Ladene Artist, MD;  Location: [facility] ENDOSCOPY;  Service: Endoscopy;  Laterality: N/A;  . ENTEROSCOPY N/A 06/12/2015   Procedure: ENTEROSCOPY;  Surgeon: Ladene Artist, MD;  Location: [facility] ENDOSCOPY;  Service: Endoscopy;  Laterality: N/A;  . EP IMPLANTABLE DEVICE N/A 02/15/2016   Procedure: Pacemaker Implant;  Surgeon:  Evans Lance, MD;  Location: [place] CV LAB;  Service: Cardiovascular;  Laterality: N/A;  . ESOPHAGOGASTRODUODENOSCOPY N/A 04/09/2014   Procedure: ESOPHAGOGASTRODUODENOSCOPY (EGD);  Surgeon: Inda Castle, MD;  Location: [place];  Service: Endoscopy;  Laterality: N/A;  . ESOPHAGOGASTRODUODENOSCOPY N/A 04/23/2015   Procedure: ESOPHAGOGASTRODUODENOSCOPY (EGD);  Surgeon: Ladene Artist, MD;  Location: [facility] ENDOSCOPY;  Service: Endoscopy;  Laterality: N/A;  . INGUINAL HERNIA REPAIR     right  . INGUINAL HERNIA REPAIR     left  . LEFT HEART CATH AND CORONARY ANGIOGRAPHY N/A 03/16/2017   Procedure: LEFT HEART CATH AND CORONARY ANGIOGRAPHY;  Surgeon: Burnell Blanks, MD;  Location: [place] CV LAB;  Service: Cardiovascular;  Laterality: N/A;  . ORIF fracture of the elbow    . SKIN CANCER EXCISION  05/21/12   Squamous cell ca  . TEE WITHOUT CARDIOVERSION N/A 02/24/2014   Procedure: TRANSESOPHAGEAL ECHOCARDIOGRAM (TEE);  Surgeon: Lelon Perla, MD;  Location: [facility] ENDOSCOPY;  Service: Cardiovascular;  Laterality: N/A;  . TONSILLECTOMY      Current Outpatient Medications  Medication Sig Dispense Refill  . apixaban (ELIQUIS) 5 MG TABS tablet Take 1 tablet (5 mg total) by mouth 2 (two) times daily. 60 tablet 6  . diltiazem (CARDIZEM CD) 120 MG 24 hr capsule TAKE (1) CAPSULE TWICE DAILY. 60 capsule 1  . dofetilide (TIKOSYN) 500 MCG capsule Take 1 capsule (500 mcg total) by mouth 2 (two) times daily. Please keep upcoming appt in May 2022 with Dr. Caryl Comes before anymore refills. Thank you 60 capsule 1  . ferrous sulfate 325 (65 FE) MG tablet Take 1 tablet (325 mg total) by mouth daily with breakfast. 60 tablet 3  . FIBER PO Take by mouth.    . furosemide (LASIX) 20 MG tablet TAKE ONE TABLET BY MOUTH ONCE DAILY 30 tablet 0  . isosorbide mononitrate (IMDUR) 30 MG 24 hr tablet Take 1 tablet (30 mg total) by mouth daily. 30 tablet 5  . Magnesium Aspartate 65 MG TABS Take 1 tablet by  mouth daily.    . metoprolol tartrate (LOPRESSOR) 25 MG tablet Take 1 tablet (25 mg total) by mouth 2 (two) times daily. 60 tablet 5  . metroNIDAZOLE (METROGEL) 0.75 % gel Apply 1 application topically daily as needed (for rosacea).     . Multiple Vitamins-Minerals (EYE VITAMINS) CAPS Take 1 capsule by mouth 2 (two) times daily.    . Multiple Vitamins-Minerals (MULTIVITAMIN PO) Take 1 tablet by mouth 2 (two) times daily.    . nitroGLYCERIN (NITROSTAT) 0.4 MG SL tablet PLACE 1 TABLET UNDER THE TONGUE EVERY 5 MINUTES AS NEEDED FOR CHEST PAIN, MAY REPEAT FOR 3 DOSES. 12 tablet 0  . pantoprazole (PROTONIX) 40 MG  tablet Take 1 tablet (40 mg total) by mouth daily. 90 tablet 1  . Probiotic Product (ALIGN PO) Take by mouth.    . rosuvastatin (CRESTOR) 10 MG tablet Take 1 tablet (10 mg total) by mouth daily. 90 tablet 1   No current facility-administered medications for this visit.    No Known Allergies  Review of Systems negative except from HPI and PMH  Physical Exam BP 130/72   Pulse 60   Ht 5\' 8"  (1.727 m)   Wt 223 lb 6.4 oz (101.3 kg)   SpO2 96%   BMI 33.97 kg/m  Well developed and well nourished in no acute distress HENT normal Neck supple with JVP-8 cm Clear Device pocket well healed; without hematoma or erythema.  There is no tethering  Regular rate and rhythm, no  gallop No  murmur Abd-soft with active BS No Clubbing cyanosis 1+ edema Skin-warm and dry A & Oriented  Grossly normal sensory and motor function  ECG atrial paced at 60 Interval 14/09/43 Otherwise normal   Assessment and  Plan  Atrial fibrillation/flutter-paroxysmal  CHADS-VASc score 5 (age-60 hypertension-1 cardiomyopathy-1 vascular disease-1)  Aortic Valve Bicuspid previously described but no longer evident  Cardiomyopathy-resolved  Pacemaker-[employer]     Hypertension  HFpEF-chronic  Exertional chest discomfort  Cath non obstructive disease  Dofetilide   High Risk Medication Surveillance     Holding sinus on dofetilide.  We will continue at 500 mcg twice daily.  Surveillance laboratories are within normal limits.  Last LDL was quite high.  In terms of aggressive therapy for his nonobstructive disease, we will increase his Crestor from 10--20 mg.  He is volume overloaded.  We will have him increase his furosemide from 20 daily to 40 every other day x5 doses hopefully try to augment his diuresis significantly.  If he does not diurese he will let us know.  He will increase his magnesium supplement on the days that he is diuresing.  Blood pressure is reasonably controlled.  I am not sure if his chest discomfort is ischemic or not.  His last Myoview was abnormal so the pretest likelihood of it being abnormal again is high; hence, we will not pursue it in the setting of our contrast shortage we will hold off on further imaging.  We will thus take an empiric approach by increasing his isosorbide from 30--60 and a statin changes noted above.  The discomfort might also be coming from heart failure and would resolve hopefully with the augmented diuresis

## 2020-10-07 DIAGNOSIS — G4733 Obstructive sleep apnea (adult) (pediatric): Secondary | ICD-10-CM | POA: Diagnosis not present

## 2020-10-23 ENCOUNTER — Encounter: Payer: Self-pay | Admitting: Internal Medicine

## 2020-10-26 ENCOUNTER — Telehealth: Payer: Self-pay | Admitting: Pharmacist

## 2020-10-26 ENCOUNTER — Ambulatory Visit (INDEPENDENT_AMBULATORY_CARE_PROVIDER_SITE_OTHER): Payer: PPO | Admitting: Pharmacist

## 2020-10-26 ENCOUNTER — Other Ambulatory Visit: Payer: Self-pay

## 2020-10-26 DIAGNOSIS — L821 Other seborrheic keratosis: Secondary | ICD-10-CM | POA: Diagnosis not present

## 2020-10-26 DIAGNOSIS — I480 Paroxysmal atrial fibrillation: Secondary | ICD-10-CM | POA: Diagnosis not present

## 2020-10-26 DIAGNOSIS — E785 Hyperlipidemia, unspecified: Secondary | ICD-10-CM | POA: Diagnosis not present

## 2020-10-26 DIAGNOSIS — I2510 Atherosclerotic heart disease of native coronary artery without angina pectoris: Secondary | ICD-10-CM

## 2020-10-26 DIAGNOSIS — C44612 Basal cell carcinoma of skin of right upper limb, including shoulder: Secondary | ICD-10-CM | POA: Diagnosis not present

## 2020-10-26 DIAGNOSIS — L570 Actinic keratosis: Secondary | ICD-10-CM | POA: Diagnosis not present

## 2020-10-26 DIAGNOSIS — C44329 Squamous cell carcinoma of skin of other parts of face: Secondary | ICD-10-CM | POA: Diagnosis not present

## 2020-10-26 DIAGNOSIS — D485 Neoplasm of uncertain behavior of skin: Secondary | ICD-10-CM | POA: Diagnosis not present

## 2020-10-26 DIAGNOSIS — Z85828 Personal history of other malignant neoplasm of skin: Secondary | ICD-10-CM | POA: Diagnosis not present

## 2020-10-26 DIAGNOSIS — I10 Essential (primary) hypertension: Secondary | ICD-10-CM | POA: Diagnosis not present

## 2020-10-26 DIAGNOSIS — C44712 Basal cell carcinoma of skin of right lower limb, including hip: Secondary | ICD-10-CM | POA: Diagnosis not present

## 2020-10-26 DIAGNOSIS — L738 Other specified follicular disorders: Secondary | ICD-10-CM | POA: Diagnosis not present

## 2020-10-26 DIAGNOSIS — D225 Melanocytic nevi of trunk: Secondary | ICD-10-CM | POA: Diagnosis not present

## 2020-10-26 NOTE — Progress Notes (Signed)
Chronic Care Management Pharmacy Note  10/26/2020 Name:  Donald [name] MRN:  [id number] DOB:  [birth year]  Summary: -Pt is starting pill packs 11/04/20  Recommendations/Changes made from today's visit: -No changes   Subjective: Donald [name] is an 87 y.o. year old male who is a primary patient of Janith Lima, MD.  The CCM team was consulted for assistance with disease management and care coordination needs.    Engaged with patient by telephone for follow up visit in response to provider referral for pharmacy case management and/or care coordination services.   Consent to Services:  The patient was given information about Chronic Care Management services, agreed to services, and gave verbal consent prior to initiation of services.  Please see initial visit note for detailed documentation.   Patient Care Team: Janith Lima, MD as PCP - General (Internal Medicine) Deboraha Sprang, MD as PCP - Cardiology (Cardiology) Franchot Gallo, MD (Urology) [name], [name], MD (Dermatology) Monna Fam, MD as Consulting Physician (Ophthalmology) Deboraha Sprang, MD as Consulting Physician (Cardiology) Pyrtle, Lajuan Lines, MD as Consulting Physician (Gastroenterology) Brand Males, MD as Consulting Physician (Pulmonary Disease) Charlton Haws, [facility] as Pharmacist (Pharmacist)  Recent office visits: 08/31/20 Dr Ronnald Ramp OV: chronic f/u, no med changes.  03/05/20 Dr Ronnald Ramp OV: CPE. Requests DNR. No med changes.  Recent consult visits: 10/01/20 Dr Caryl Comes (cardiology): F/U Afib, HF. increased rosuvastatin from 10 to 20 mg. Volume overload - increase lasix to 40 mg x 5 doses, increase Mg supplement accordingly. Increase isosorbide from 30 to 60 mg to treat chest discomfort empirically.   08/07/20 NP Roderic Palau (Afib clinic): pt is doing well, no med changes.  06/16/20 Dr [name] (dermatology): f/u hx skin cancer  06/04/20 Dr Vaughan Browner (pulmonary): f/u ILD, OSA. Unknown etiology for ILD.  Declined lung biopsy. Continue conservative management.  06/02/20 Dr Nelva Bush (phys med): f/u DDD (lumbar)  Hospital visits: None in previous 6 months  Objective:  Lab Results  Component Value Date   CREATININE 0.98 08/07/2020   BUN 18 08/07/2020   GFR 71.00 03/05/2020   GFRNONAA >60 08/07/2020   GFRAA >60 02/25/2019   NA 138 08/07/2020   K 4.6 08/07/2020   CALCIUM 9.1 08/07/2020   CO2 26 08/07/2020   GLUCOSE 93 08/07/2020    Lab Results  Component Value Date/Time   GFR 71.00 03/05/2020 09:53 AM   GFR 70.38 09/06/2016 09:27 AM    Last diabetic Eye exam: No results found for: HMDIABEYEEXA  Last diabetic Foot exam: No results found for: HMDIABFOOTEX   Lab Results  Component Value Date   CHOL 185 03/05/2020   HDL 47.20 03/05/2020   LDLCALC 124 (H) 03/05/2020   LDLDIRECT 138.8 04/18/2011   TRIG 71.0 03/05/2020   CHOLHDL 4 03/05/2020    Hepatic Function Latest Ref Rng & Units 03/05/2020 11/22/2018 04/11/2018  Total Protein 6.0 - 8.3 g/dL 6.8 7.6 6.8  Albumin 3.5 - 5.2 g/dL 4.1 4.6 3.8  AST 0 - 37 U/L $Remo'20 17 20  'BOqSf$ ALT 0 - 53 U/L $Remo'17 15 19  'locNu$ Alk Phosphatase 39 - 117 U/L 70 76 72  Total Bilirubin 0.2 - 1.2 mg/dL 0.5 1.0 0.7  Bilirubin, Direct 0.0 - 0.3 mg/dL 0.1 0.2 -    Lab Results  Component Value Date/Time   TSH 0.65 03/05/2020 09:53 AM   TSH 0.68 11/22/2018 03:40 PM    CBC Latest Ref Rng & Units 08/31/2020 02/24/2020 08/20/2019  WBC 4.0 - 10.5 K/uL 8.1 8.3  7.6  Hemoglobin 13.0 - 17.0 g/dL 12.8(L) 14.2 12.9(L)  Hematocrit 39.0 - 52.0 % 38.0(L) 42.1 37.3(L)  Platelets 150.0 - 400.0 K/uL 209.0 193.0 192.0    No results found for: VD25OH  Clinical ASCVD: Yes  The ASCVD Risk score Mikey Bussing DC Jr., et al., 2013) failed to calculate for the following reasons:   The 2013 ASCVD risk score is only valid for ages 24 to 53    Depression screen PHQ 2/9 08/31/2020 04/28/2020 03/18/2019  Decreased Interest 0 0 0  Down, Depressed, Hopeless 0 0 0  PHQ - 2 Score 0 0 0  Altered  sleeping - - -  Tired, decreased energy - - -  Change in appetite - - -  Feeling bad or failure about yourself  - - -  Trouble concentrating - - -  Moving slowly or fidgety/restless - - -  Suicidal thoughts - - -  PHQ-9 Score - - -  Difficult doing work/chores - - -  Some recent data might be hidden     CHA2DS2-VASc Score = 4  The patient's score is based upon: CHF History: No HTN History: Yes Diabetes History: No Stroke History: No Vascular Disease History: Yes Age Score: 2 Gender Score: 0      Social History   Tobacco Use  Smoking Status Former   Packs/day: 0.00   Years: 40.00   Pack years: 0.00   Types: Cigarettes, Cigars   Quit date: 09/18/2008   Years since quitting: 12.1  Smokeless Tobacco Former   Types: Chew  Tobacco Comments   Has Occasional Cigar/ quit    BP Readings from Last 3 Encounters:  10/01/20 130/72  08/31/20 122/80  08/07/20 112/78   Pulse Readings from Last 3 Encounters:  10/01/20 60  08/31/20 60  08/07/20 60   Wt Readings from Last 3 Encounters:  10/01/20 223 lb 6.4 oz (101.3 kg)  08/31/20 226 lb (102.5 kg)  08/07/20 226 lb (102.5 kg)   BMI Readings from Last 3 Encounters:  10/01/20 33.97 kg/m  08/31/20 34.36 kg/m  08/07/20 34.36 kg/m    Assessment/Interventions: Review of patient past medical history, allergies, medications, health status, including review of consultants reports, laboratory and other test data, was performed as part of comprehensive evaluation and provision of chronic care management services.   SDOH:  (Social Determinants of Health) assessments and interventions performed: Yes  SDOH Screenings   Alcohol Screen: Not on file  Depression (PHQ2-9): Low Risk    PHQ-2 Score: 0  Financial Resource Strain: Low Risk    Difficulty of Paying Living Expenses: Not hard at all  Food Insecurity: No Food Insecurity   Worried About Charity fundraiser in the Last Year: Never true   Ran Out of Food in the Last Year: Never  true  Housing: Low Risk    Last Housing Risk Score: 0  Physical Activity: Sufficiently Active   Days of Exercise per Week: 5 days   Minutes of Exercise per Session: 30 min  Social Connections: Engineer, building services of Communication with Friends and Family: More than three times a week   Frequency of Social Gatherings with Friends and Family: More than three times a week   Attends Religious Services: More than 4 times per year   Active Member of [employer] or Organizations: Yes   Attends Music therapist: More than 4 times per year   Marital Status: Married  Stress: No Stress Concern Present   Feeling of Stress :  Not at all  Tobacco Use: Medium Risk   Smoking Tobacco Use: Former   Smokeless Tobacco Use: Former  Transport planner Needs: No Data processing manager (Medical): No   Lack of Transportation (Non-Medical): No    CCM Care Plan  No Known Allergies  Medications Reviewed Today     Reviewed by Charlton Haws, [facility] (Pharmacist) on 10/26/20 at [place] List Status: <None>   Medication Order Taking? Sig Documenting Provider Last Dose Status Informant  apixaban (ELIQUIS) 5 MG TABS tablet [id number] Yes Take 1 tablet (5 mg total) by mouth 2 (two) times daily. Sherran Needs, NP Taking Active   diltiazem (CARDIZEM CD) 120 MG 24 hr capsule [id number] Yes TAKE (1) CAPSULE TWICE DAILY. Deboraha Sprang, MD Taking Active   dofetilide [facility]) 500 MCG capsule [id number] Yes Take 1 capsule (500 mcg total) by mouth 2 (two) times daily. Please keep upcoming appt in May 2022 with Dr. Caryl Comes before anymore refills. Thank you Deboraha Sprang, MD Taking Active   ferrous sulfate 325 (65 FE) MG tablet [id number] Yes Take 1 tablet (325 mg total) by mouth daily with breakfast. Reyne Dumas, MD Taking Active Self  FIBER PO [id number] Yes Take by mouth. [provider] Taking Active   furosemide (LASIX) 20 MG tablet [id number] Yes TAKE ONE TABLET BY  MOUTH ONCE DAILY Deboraha Sprang, MD Taking Active   isosorbide mononitrate (IMDUR) 60 MG 24 hr tablet [id number] Yes Take 1 tablet (60 mg total) by mouth daily. Deboraha Sprang, MD Taking Active   Magnesium Aspartate 65 MG TABS [id number] Yes Take 1 tablet by mouth daily. [provider] Taking Active   metoprolol tartrate (LOPRESSOR) 25 MG tablet [id number] Yes Take 1 tablet (25 mg total) by mouth 2 (two) times daily. Deboraha Sprang, MD Taking Active   metroNIDAZOLE (METROGEL) 0.75 % gel [id number] Yes Apply 1 application topically daily as needed (for rosacea).  [provider] Taking Active Self  Multiple Vitamins-Minerals (EYE VITAMINS) CAPS [id number] Yes Take 1 capsule by mouth 2 (two) times daily. [provider] Taking Active Self  Multiple Vitamins-Minerals (MULTIVITAMIN PO) [id number] Yes Take 1 tablet by mouth 2 (two) times daily. [provider] Taking Active Self  nitroGLYCERIN (NITROSTAT) 0.4 MG SL tablet [id number] Yes PLACE 1 TABLET UNDER THE TONGUE EVERY 5 MINUTES AS NEEDED FOR CHEST PAIN, MAY REPEAT FOR 3 DOSES. Deboraha Sprang, MD Taking Active   pantoprazole (PROTONIX) 40 MG tablet [id number] Yes Take 1 tablet (40 mg total) by mouth daily. Jerene Bears, MD Taking Active   Probiotic Product (ALIGN PO) [id number] Yes Take by mouth. [provider] Taking Active   rosuvastatin (CRESTOR) 20 MG tablet [id number] Yes Take 1 tablet (20 mg total) by mouth daily. Deboraha Sprang, MD Taking Active   Med List Note Wynona Canes, CPhT 03/10/17 1021): CPAP            Patient Active Problem List   Diagnosis Date Noted   Pacemaker 07/05/2019   ILD (interstitial lung disease) ([place]) 04/23/2019   Emphysema lung ([place]) 04/19/2018   Coronary artery disease involving native coronary artery of native heart without angina pectoris 06/08/2017   Tachycardia 06/08/2017   Renal cyst, left 03/08/2016   Iron deficiency anemia due to chronic blood loss  04/29/2015   Anticoagulant long-term use    Hearing loss 01/20/2015   Esophageal stricture 04/09/2014   PAF (paroxysmal atrial fibrillation) ([place]) 04/08/2014   Atrial  flutter ([place]) 03/19/2014   Habitual alcohol use    Obesity (BMI 30.0-34.9)    OSA (obstructive sleep apnea) 02/03/2014   Hyperlipidemia with target LDL less than 100 05/07/2013   Routine health maintenance 04/19/2011   Essential hypertension 03/10/2008   ERECTILE DYSFUNCTION 03/20/2007   ATHEROSCLEROSIS, AORTIC 01/24/2007   Allergic rhinitis due to pollen 01/24/2007    Immunization History  Administered Date(s) Administered   Fluad Quad(high Dose 65+) 01/19/2019   H1N1 04/28/2008   Influenza Split 02/23/2011, 02/07/2012   Influenza Whole 02/07/2008, 02/04/2009   Influenza, High Dose Seasonal PF 01/20/2015, 03/07/2016, 02/20/2017, 02/20/2018   Influenza,inj,Quad PF,6+ Mos 02/07/2013, 02/03/2014   Influenza-Unspecified 02/20/2020   Moderna Sars-Covid-2 Vaccination 05/28/2019, 06/26/2019, 03/27/2020, 08/24/2020   Pneumococcal Conjugate-13 05/07/2013   Pneumococcal Polysaccharide-23 03/20/2007, 03/07/2016   Td 04/11/2008   Tdap 04/16/2018   Zoster Recombinat (Shingrix) 02/27/2018, 04/30/2018   Zoster, Live 01/27/2010    Conditions to be addressed/monitored:  Hypertension, Hyperlipidemia, Atrial Fibrillation, Coronary Artery Disease and GERD  Care Plan : Lima  Updates made by Charlton Haws, [place] since 10/26/2020 12:00 AM     Problem: Hypertension, Hyperlipidemia, Atrial Fibrillation, Coronary Artery Disease and GERD   Priority: High     Long-Range Goal: Disease management   Start Date: 08/10/2020  Expected End Date: 02/10/2021  This Visit's Progress: On track  Recent Progress: On track  Priority: High  Note:   Current Barriers:  Unable to independently monitor therapeutic efficacy Suboptimal pharmacy services  Pharmacist Clinical Goal(s):  Patient will achieve adherence to  monitoring guidelines and medication adherence to achieve therapeutic efficacy Utilize [facility] pharmacy for medication synchronization, packaging and delivery through collaboration with PharmD and provider.   Interventions: 1:1 collaboration with Janith Lima, MD regarding development and update of comprehensive plan of care as evidenced by provider attestation and co-signature Inter-disciplinary care team collaboration (see longitudinal plan of care) Comprehensive medication review performed; medication list updated in electronic medical record  Hypertension (BP goal <130/80) -Controlled - BP was at goal during recent office visits -Current treatment: Diltiazem CD 120 mg BID Furosemide 20 mg daily Isosorbide MN 60 mg daily Metoprolol tartrate 25 mg BID -Current home readings: n/a -Current exercise habits: pool exercises 4 days a week, [employer] 3 days a week -Denies hypotensive/hypertensive symptoms -Educated on BP goals and benefits of medications for prevention of heart attack, stroke and kidney damage; Importance of home blood pressure monitoring; -Counseled to monitor BP at home as needed, document, and provide log at future appointments -Recommended to continue current medication  Hyperlipidemia: (LDL goal < 70) -Not ideally controlled - LDL increased to 124 Oct 2021 after beng < 70 for many years, possibly due to pandemic changes in diet/exercise routine. Pt endorses compliance with atorvastatin. He also endorses daily leg cramps, worse at night, that he reports are his biggest health complaint right now. He was switched to rosuvastatin 10 mg in March 2022, which was increased to 20 mg in May 2022; he has not had repeat lipid panel since changing statin -hx aortic atherosclerosis -Current treatment: Rosuvastatin 20 mg daily Nitroglycerin 0.4 mg SL prn Isosorbide MN 60 mg daily -Educated on Cholesterol goals; Benefits of statin for ASCVD risk reduction; Strategies to  manage statin-induced myalgias;  -Recommend to continue current medication; repeat lipid panel at PCP f/u Oct 2022  Atrial Fibrillation (Goal: prevent stroke and major bleeding) -Controlled - pt reports infrequent symptomatic Afib, he was told by Afib clinic that he may  take extra 1/2 dose of metoprolol if HR is >100 and SBP > 100 -low Afib burden, PPM in place -CHADSVASC: 4 -Current treatment: Rate control: Dofetilide 500 mg BID, Diltiazem CD 120 mg BID, metoprolol tartrate 25 mg BID Anticoagulation: Eliquis 5 mg BID -Counseled on increased risk of stroke due to Afib and benefits of anticoagulation for stroke prevention; importance of adherence to anticoagulant exactly as prescribed; -Assessed patient finances. He will not qualify for patient assistance for Eliquis. Obtained tier exception for dofetilide - will now be Tier 1 copay. -Recommended to continue current medication  GERD (Goal: manage symptoms) -Controlled -Current treatment  Pantoprazole 40 mg daily -Recommended to continue current medication  Health Maintenance -Vaccine gaps: none -Current therapy:  Fiber Probiotic (Align) Ferrous sulfate 25 mg daily Multivitamin Preservision Areds 2 BID Metronidazole 0.75% gel prn (rosacea) -Patient is satisfied with current therapy and denies issues -Recommended to continue current medication  Patient Goals/Self-Care Activities Patient will:  - take medications as prescribed -focus on medication adherence by pill packs -check blood pressure as needed, document, and provide at future appointments -target a minimum of 150 minutes of moderate intensity exercise weekly       Medication Assistance:  -Tier exception approved for dofetilide (initially Tier 4, now Tier 1) -Eliquis is Tier 3 and expensive during donut hole, however pt income is too high to qualify for pt assistance  Patient's preferred pharmacy is:  [facility] - [place], [place] - [address]  [address] [place] [address] Phone: [phone] Fax: [phone]  Uses pill box? Yes Pt endorses 100% compliance  We discussed: Reviewed patient's [facility] medication and Epic medication profile assuring there are no discrepancies or gaps in therapy. Confirmed all fill dates appropriate and verified with patient that there is a sufficient quantity of all prescribed medications at home. Informed patient to call me any time if needing medications before scheduled deliveries. The anticipated medication sync date is 11/04/20.  Patient decided to: Utilize [facility] pharmacy for medication synchronization, packaging and delivery  Care Plan and Follow Up Patient Decision:  Patient agrees to Care Plan and Follow-up.  Plan: Telephone follow up appointment with care management team member scheduled for:  6 months  Charlene Brooke, PharmD, [place], CPP Clinical Pharmacist [facility] at [facility] [phone]

## 2020-10-26 NOTE — Patient Instructions (Signed)
Visit Information  Phone number for Pharmacist: [phone]   Goals Addressed             This Visit's Progress    Manage My Medicine       Timeframe:  Long-Range Goal Priority:  High Start Date:     08/10/20                        Expected End Date: 08/10/21                     Follow Up Date Dec 2022   - call for medicine refill 2 or 3 days before it runs out - call if I am sick and can't take my medicine - keep a list of all the medicines I take; vitamins and herbals too  -Utilize [facility] pharmacy for medication synchronization, packaging and delivery   Why is this important?   These steps will help you keep on track with your medicines.   Notes:          Patient verbalizes understanding of instructions provided today and agrees to view in [place].  Telephone follow up appointment with pharmacy team member scheduled for: 6 months  Charlene Brooke, PharmD, [place], CPP Clinical Pharmacist [facility] at [facility] [phone]

## 2020-10-29 ENCOUNTER — Encounter: Payer: Self-pay | Admitting: Internal Medicine

## 2020-10-30 ENCOUNTER — Other Ambulatory Visit: Payer: Self-pay | Admitting: *Deleted

## 2020-10-30 MED ORDER — DOFETILIDE 500 MCG PO CAPS: 500.0000 ug | ORAL_CAPSULE | Freq: Two times a day (BID) | ORAL | 11 refills | Status: DC

## 2020-11-02 ENCOUNTER — Encounter: Payer: PPO | Admitting: Internal Medicine

## 2020-11-03 ENCOUNTER — Encounter: Payer: Self-pay | Admitting: Internal Medicine

## 2020-11-07 DIAGNOSIS — G4733 Obstructive sleep apnea (adult) (pediatric): Secondary | ICD-10-CM | POA: Diagnosis not present

## 2020-11-09 NOTE — Progress Notes (Signed)
    Chronic Care Management Pharmacy Assistant   Name: Donald [name]  MRN: [id number] DOB: Oct 22, 1932   Medications: Outpatient Encounter Medications as of 10/26/2020  Medication Sig   apixaban (ELIQUIS) 5 MG TABS tablet Take 1 tablet (5 mg total) by mouth 2 (two) times daily.   diltiazem (CARDIZEM CD) 120 MG 24 hr capsule TAKE (1) CAPSULE TWICE DAILY.   ferrous sulfate 325 (65 FE) MG tablet Take 1 tablet (325 mg total) by mouth daily with breakfast.   FIBER PO Take by mouth.   furosemide (LASIX) 20 MG tablet TAKE ONE TABLET BY MOUTH ONCE DAILY   isosorbide mononitrate (IMDUR) 60 MG 24 hr tablet Take 1 tablet (60 mg total) by mouth daily.   Magnesium Aspartate 65 MG TABS Take 1 tablet by mouth daily.   metoprolol tartrate (LOPRESSOR) 25 MG tablet Take 1 tablet (25 mg total) by mouth 2 (two) times daily.   metroNIDAZOLE (METROGEL) 0.75 % gel Apply 1 application topically daily as needed (for rosacea).    Multiple Vitamins-Minerals (EYE VITAMINS) CAPS Take 1 capsule by mouth 2 (two) times daily.   Multiple Vitamins-Minerals (MULTIVITAMIN PO) Take 1 tablet by mouth 2 (two) times daily.   nitroGLYCERIN (NITROSTAT) 0.4 MG SL tablet PLACE 1 TABLET UNDER THE TONGUE EVERY 5 MINUTES AS NEEDED FOR CHEST PAIN, MAY REPEAT FOR 3 DOSES.   pantoprazole (PROTONIX) 40 MG tablet Take 1 tablet (40 mg total) by mouth daily.   Probiotic Product (ALIGN PO) Take by mouth.   rosuvastatin (CRESTOR) 20 MG tablet Take 1 tablet (20 mg total) by mouth daily.   [DISCONTINUED] dofetilide (TIKOSYN) 500 MCG capsule Take 1 capsule (500 mcg total) by mouth 2 (two) times daily. Please keep upcoming appt in May 2022 with Dr. Caryl Comes before anymore refills. Thank you   No facility-administered encounter medications on file as of 10/26/2020.   Pharmacist Review  A call was made to review patient medications for delivery. Clinical Pharmacist reviewed patient medications for review 6/13/222  Ethelene Hal Clinical Pharmacist  Assistant [phone]   Time spent:2

## 2020-11-11 ENCOUNTER — Other Ambulatory Visit: Payer: Self-pay

## 2020-11-11 ENCOUNTER — Ambulatory Visit: Payer: PPO | Admitting: Pulmonary Disease

## 2020-11-11 ENCOUNTER — Telehealth: Payer: Self-pay | Admitting: Pharmacist

## 2020-11-11 ENCOUNTER — Encounter: Payer: Self-pay | Admitting: Pulmonary Disease

## 2020-11-11 VITALS — BP 118/66 | HR 60 | Ht 69.0 in | Wt 223.0 lb

## 2020-11-11 DIAGNOSIS — J849 Interstitial pulmonary disease, unspecified: Secondary | ICD-10-CM

## 2020-11-11 NOTE — Progress Notes (Signed)
DANA DORNER    [id number]    [birth year]  Primary Care Physician:Jones, Arvid Right, MD  Referring Physician: Janith Lima, MD [address] [place],  [place] [address]  Chief complaint: Follow-up for pulmonary fibrosis  HPI: 87 year old with recurrent atrial fibrillation, OSA, ex-smoker.  Evaluated in the ILD clinic on 05/24/2022 after high-resolution CT scan that showed pulmonary fibrosis.  At that point he was being considered for amiodarone for atrial fibrillation.  Seen by Dr. Chase Caller and recommended against amiodarone.  He is currently on Tikosyn and follows with Dr. Caryl Comes, EP   Pets: No pets Occupation: Worked as a Land Exposures: Had a down pillow and comforters which he got rid of last month.  No ongoing exposure.  No mold, hot tub, Jacuzzi, asbestos, humidifier Smoking history: 50-pack-year smoker.  Quit in 2000 Travel history: From [place].  No significant Relevant family history: Brother died of pulmonary fibrosis.  Interim history: Here for follow-up of indeterminate pulmonary fibrosis His breathing is doing well with no issues He remains active by doing swimming and water aerobics for half an hour every day.  Seen by Dr. Caryl Comes, cardiology last month who noted volume overload.  Treated with increase Lasix.  He may need a cardiac cath for anginal symptoms.  Cardiology notes reviewed  Outpatient Encounter Medications as of 11/11/2020  Medication Sig   apixaban (ELIQUIS) 5 MG TABS tablet Take 1 tablet (5 mg total) by mouth 2 (two) times daily.   diltiazem (CARDIZEM CD) 120 MG 24 hr capsule TAKE (1) CAPSULE TWICE DAILY.   dofetilide (TIKOSYN) 500 MCG capsule Take 1 capsule (500 mcg total) by mouth 2 (two) times daily. Please keep upcoming appt in May 2022 with Dr. Caryl Comes before anymore refills. Thank you   ferrous sulfate 325 (65 FE) MG tablet Take 1 tablet (325 mg total) by mouth daily with breakfast.   FIBER PO Take by mouth.    furosemide (LASIX) 20 MG tablet TAKE ONE TABLET BY MOUTH ONCE DAILY   isosorbide mononitrate (IMDUR) 60 MG 24 hr tablet Take 1 tablet (60 mg total) by mouth daily.   Magnesium Aspartate 65 MG TABS Take 1 tablet by mouth daily.   metoprolol tartrate (LOPRESSOR) 25 MG tablet Take 1 tablet (25 mg total) by mouth 2 (two) times daily.   metroNIDAZOLE (METROGEL) 0.75 % gel Apply 1 application topically daily as needed (for rosacea).    Multiple Vitamins-Minerals (EYE VITAMINS) CAPS Take 1 capsule by mouth 2 (two) times daily.   Multiple Vitamins-Minerals (MULTIVITAMIN PO) Take 1 tablet by mouth 2 (two) times daily.   nitroGLYCERIN (NITROSTAT) 0.4 MG SL tablet PLACE 1 TABLET UNDER THE TONGUE EVERY 5 MINUTES AS NEEDED FOR CHEST PAIN, MAY REPEAT FOR 3 DOSES.   pantoprazole (PROTONIX) 40 MG tablet Take 1 tablet (40 mg total) by mouth daily.   Probiotic Product (ALIGN PO) Take by mouth.   rosuvastatin (CRESTOR) 20 MG tablet Take 1 tablet (20 mg total) by mouth daily.   No facility-administered encounter medications on file as of 11/11/2020.    Allergies as of 11/11/2020   (No Known Allergies)    Physical Exam: Gen:      No acute distress HEENT:  EOMI, sclera anicteric Neck:     No masses; no thyromegaly Lungs:    Faint bibasal crackles CV:         Regular rate and rhythm; no murmurs Abd:      +  bowel sounds; soft, non-tender; no palpable masses, no distension Ext:    No edema; adequate peripheral perfusion Skin:      Warm and dry; no rash Neuro: alert and oriented x 3 Psych: normal mood and affect   Data Reviewed: Imaging: CT chest 05/07/2013- mild emphysematous changes, compressive atelectasis in the right lower lobe.   CT high-resolution 04/23/2018- bronchial wall thickening with mild emphysema.  Patchy areas of peripheral septal thickening, mild groundglass.  Indeterminate for UIP. CT high-resolution 05/06/2020- mild emphysema, stable mild interstitial lung disease, indeterminate for UIP. I  have reviewed the images personally.  PFTs: 04/18/2018 FVC 3.43 [96%), FEV1 2.63 [105%], F/F 77, TLC 6.37 [95%], DLCO 17.94 [60%]  05/18/2020 FVC 3.39 [97%], FEV1 2.60 [108%], F/F 77, TLC 5.54 [83%], DLCO 16.87 [75%] Mild diffusion defect  Labs: Hypersensitivity panel 05/24/2018-negative CTD serologies-ANA negative, CCP less than 16, double-stranded DNA, Ro, La, SCL 70- Rheumatoid factor 14  Aldolase 5.3 CK 3 1 ANCA-negative  Myositis panel 07/12/2018-negative  Assessment:  Interstitial lung disease, pulmonary fibrosis Unclear etiology.  The fibrosis is indeterminate for UIP on CT scan and appears to be new compared to 2014.  Possibilities include hypersensitivity pneumonitis given his exposure to down pillow and comforter or IPF as he has a family history of pulmonary fibrosis.  He has gotten rid of his down pillows and comforters in 2020  ILD serologies reviewed with borderline rheumatoid factor which is nonspecific.    Discussed further work-up including lung biopsy.  He is opposed to surgical lung biopsy or bronchoscope due to risks involved.  I agree that at his age we can continue with conservative management and monitoring especially as his CT scan shows no progression of fibrosis  OSA Compliant with CPAP  Plan/Recommendations: - Continue monitoring  Follow-up in 6 months.  Marshell Garfinkel MD [facility] 11/11/2020, 8:59 AM  CC: Janith Lima, MD

## 2020-11-11 NOTE — Progress Notes (Signed)
    Chronic Care Management Pharmacy Assistant   Name: Donald [name]  MRN: [id number] DOB: [birth year]   Reason for Encounter: Chart Review    Medications: Outpatient Encounter Medications as of 11/11/2020  Medication Sig   apixaban (ELIQUIS) 5 MG TABS tablet Take 1 tablet (5 mg total) by mouth 2 (two) times daily.   diltiazem (CARDIZEM CD) 120 MG 24 hr capsule TAKE (1) CAPSULE TWICE DAILY.   dofetilide (TIKOSYN) 500 MCG capsule Take 1 capsule (500 mcg total) by mouth 2 (two) times daily. Please keep upcoming appt in May 2022 with Dr. Caryl Comes before anymore refills. Thank you   ferrous sulfate 325 (65 FE) MG tablet Take 1 tablet (325 mg total) by mouth daily with breakfast.   FIBER PO Take by mouth.   furosemide (LASIX) 20 MG tablet TAKE ONE TABLET BY MOUTH ONCE DAILY   isosorbide mononitrate (IMDUR) 60 MG 24 hr tablet Take 1 tablet (60 mg total) by mouth daily.   Magnesium Aspartate 65 MG TABS Take 1 tablet by mouth daily.   metoprolol tartrate (LOPRESSOR) 25 MG tablet Take 1 tablet (25 mg total) by mouth 2 (two) times daily.   metroNIDAZOLE (METROGEL) 0.75 % gel Apply 1 application topically daily as needed (for rosacea).    Multiple Vitamins-Minerals (EYE VITAMINS) CAPS Take 1 capsule by mouth 2 (two) times daily.   Multiple Vitamins-Minerals (MULTIVITAMIN PO) Take 1 tablet by mouth 2 (two) times daily.   nitroGLYCERIN (NITROSTAT) 0.4 MG SL tablet PLACE 1 TABLET UNDER THE TONGUE EVERY 5 MINUTES AS NEEDED FOR CHEST PAIN, MAY REPEAT FOR 3 DOSES.   pantoprazole (PROTONIX) 40 MG tablet Take 1 tablet (40 mg total) by mouth daily.   Probiotic Product (ALIGN PO) Take by mouth.   rosuvastatin (CRESTOR) 20 MG tablet Take 1 tablet (20 mg total) by mouth daily.   No facility-administered encounter medications on file as of 11/11/2020.   Pharmacist Review Reviewed chart for medication changes and adherence.  Recent OV, Consult or Hospital visit: Dr. Marshell Garfinkel 11/11/20 No medication changes  indicated  No gaps in adherence identified. Patient has follow up scheduled with pharmacy team. No further action required.   [place] Pharmacist Assistant [phone]   Time spent:5

## 2020-11-11 NOTE — Patient Instructions (Signed)
I am glad you are doing well with regard to your breathing We will order high-resolution CT and PFTs in 6 months Follow-up in clinic after these tests.

## 2020-11-13 DIAGNOSIS — U071 COVID-19: Secondary | ICD-10-CM

## 2020-11-13 HISTORY — DX: COVID-19: U07.1

## 2020-11-17 ENCOUNTER — Ambulatory Visit (INDEPENDENT_AMBULATORY_CARE_PROVIDER_SITE_OTHER): Payer: PPO

## 2020-11-17 DIAGNOSIS — I495 Sick sinus syndrome: Secondary | ICD-10-CM | POA: Diagnosis not present

## 2020-11-17 LAB — CUP PACEART REMOTE DEVICE CHECK
Battery Remaining Longevity: 61 mo
Battery Remaining Percentage: 54 %
Battery Voltage: 2.99 V
Brady Statistic AP VP Percent: 1 %
Brady Statistic AP VS Percent: 99 %
Brady Statistic AS VP Percent: 1 %
Brady Statistic AS VS Percent: 1.1 %
Brady Statistic RA Percent Paced: 99 %
Brady Statistic RV Percent Paced: 1 %
Date Time Interrogation Session: 20220705020019
Implantable Lead Implant Date: 20171002
Implantable Lead Implant Date: 20171002
Implantable Lead Location: 753859
Implantable Lead Location: 753860
Implantable Pulse Generator Implant Date: 20171002
Lead Channel Impedance Value: 380 Ohm
Lead Channel Impedance Value: 510 Ohm
Lead Channel Pacing Threshold Amplitude: 0.5 V
Lead Channel Pacing Threshold Amplitude: 0.75 V
Lead Channel Pacing Threshold Pulse Width: 0.5 ms
Lead Channel Pacing Threshold Pulse Width: 0.5 ms
Lead Channel Sensing Intrinsic Amplitude: 12 mV
Lead Channel Sensing Intrinsic Amplitude: 2.8 mV
Lead Channel Setting Pacing Amplitude: 1.5 V
Lead Channel Setting Pacing Amplitude: 2.5 V
Lead Channel Setting Pacing Pulse Width: 0.5 ms
Lead Channel Setting Sensing Sensitivity: 2 mV
Pulse Gen Model: 2272
Pulse Gen Serial Number: 7951215

## 2020-11-20 ENCOUNTER — Telehealth: Payer: Self-pay | Admitting: Pharmacist

## 2020-11-20 NOTE — Progress Notes (Signed)
Chronic Care Management Pharmacy Assistant   Name: MONTERRIUS CARDOSA  MRN: [id number] DOB: January 05, 1933   Reason for Encounter: Medication Review    Recent office visits:  None ID  Recent consult visits:  11/11/20 Dr. Hart Robinsons [facility] visits:  None in previous 6 months  Medications: Outpatient Encounter Medications as of 11/20/2020  Medication Sig   apixaban (ELIQUIS) 5 MG TABS tablet Take 1 tablet (5 mg total) by mouth 2 (two) times daily.   diltiazem (CARDIZEM CD) 120 MG 24 hr capsule TAKE (1) CAPSULE TWICE DAILY.   dofetilide (TIKOSYN) 500 MCG capsule Take 1 capsule (500 mcg total) by mouth 2 (two) times daily. Please keep upcoming appt in May 2022 with Dr. Caryl Comes before anymore refills. Thank you   ferrous sulfate 325 (65 FE) MG tablet Take 1 tablet (325 mg total) by mouth daily with breakfast.   FIBER PO Take by mouth.   furosemide (LASIX) 20 MG tablet TAKE ONE TABLET BY MOUTH ONCE DAILY   isosorbide mononitrate (IMDUR) 60 MG 24 hr tablet Take 1 tablet (60 mg total) by mouth daily.   Magnesium Aspartate 65 MG TABS Take 1 tablet by mouth daily.   metoprolol tartrate (LOPRESSOR) 25 MG tablet Take 1 tablet (25 mg total) by mouth 2 (two) times daily.   metroNIDAZOLE (METROGEL) 0.75 % gel Apply 1 application topically daily as needed (for rosacea).    Multiple Vitamins-Minerals (EYE VITAMINS) CAPS Take 1 capsule by mouth 2 (two) times daily.   Multiple Vitamins-Minerals (MULTIVITAMIN PO) Take 1 tablet by mouth 2 (two) times daily.   nitroGLYCERIN (NITROSTAT) 0.4 MG SL tablet PLACE 1 TABLET UNDER THE TONGUE EVERY 5 MINUTES AS NEEDED FOR CHEST PAIN, MAY REPEAT FOR 3 DOSES.   pantoprazole (PROTONIX) 40 MG tablet Take 1 tablet (40 mg total) by mouth daily.   Probiotic Product (ALIGN PO) Take by mouth.   rosuvastatin (CRESTOR) 20 MG tablet Take 1 tablet (20 mg total) by mouth daily.   No facility-administered encounter medications on file as of 11/20/2020.   Pharmacist  Review Reviewed chart for medication changes ahead of medication coordination call.  No OVs, Consults, or hospital visits since last care coordination call/Pharmacist visit. (If appropriate, list visit date, provider name)  No medication changes indicated OR if recent visit, treatment plan here.  BP Readings from Last 3 Encounters:  11/11/20 118/66  10/01/20 130/72  08/31/20 122/80    No results found for: HGBA1C   Patient obtains medications through Adherence Packaging  30 Days   Last adherence delivery included:  Dofetilide 500 mg 1 tab at breakfast and bedtime Magnesium 64 mg 1 tab daily Ferrous sulfate 324 mg 1 tab daily Preservision Areds 2mg  1 tab at breakfast and bedtime Eliquis 5 mg 1 tab breakfast and bedtime Metoprolol tart 25 mg 1 tab breakfast and bedtime Isosoribide Mono 60 mg 1 tab daily Rosuvastatin 20 mg 1 tab daily Diltiazem 120 mg 1 tab breakfast and bedtime Pantoprazole 40 mg 1 tab daily  Patient is due for next adherence delivery on: 12/02/20. Called patient and reviewed medications and coordinated delivery.  This delivery to include: Dofetilide 500 mg 1 tab at breakfast and bedtime Magnesium 64 mg 1 tab daily Ferrous sulfate 324 mg 1 tab daily Preservision Areds 2mg  1 tab at breakfast and bedtime Eliquis 5 mg 1 tab breakfast and bedtime Metoprolol tart 25 mg 1 tab breakfast and bedtime Isosoribide Mono 60 mg 1 tab daily Rosuvastatin 20 mg 1 tab  daily Diltiazem 120 mg 1 tab breakfast and bedtime Pantoprazole 40 mg 1 tab daily  Patient needs refills for Furosemide 20 mg CPP will request.  Confirmed delivery date of 12/02/20, advised patient that pharmacy will contact them the morning of delivery.   [place] Pharmacist Assistant [phone]   Time spent:31

## 2020-11-26 DIAGNOSIS — C44329 Squamous cell carcinoma of skin of other parts of face: Secondary | ICD-10-CM | POA: Diagnosis not present

## 2020-11-26 DIAGNOSIS — Z85828 Personal history of other malignant neoplasm of skin: Secondary | ICD-10-CM | POA: Diagnosis not present

## 2020-11-27 ENCOUNTER — Other Ambulatory Visit: Payer: Self-pay | Admitting: Internal Medicine

## 2020-11-30 ENCOUNTER — Encounter: Payer: Self-pay | Admitting: Internal Medicine

## 2020-12-01 DIAGNOSIS — G4733 Obstructive sleep apnea (adult) (pediatric): Secondary | ICD-10-CM | POA: Diagnosis not present

## 2020-12-07 DIAGNOSIS — G4733 Obstructive sleep apnea (adult) (pediatric): Secondary | ICD-10-CM | POA: Diagnosis not present

## 2020-12-07 NOTE — Progress Notes (Signed)
Remote pacemaker transmission.   

## 2020-12-14 DIAGNOSIS — Z20822 Contact with and (suspected) exposure to covid-19: Secondary | ICD-10-CM | POA: Diagnosis not present

## 2020-12-14 DIAGNOSIS — D649 Anemia, unspecified: Secondary | ICD-10-CM

## 2020-12-14 DIAGNOSIS — J189 Pneumonia, unspecified organism: Secondary | ICD-10-CM

## 2020-12-14 HISTORY — DX: Pneumonia, unspecified organism: J18.9

## 2020-12-14 HISTORY — DX: Anemia, unspecified: D64.9

## 2020-12-15 ENCOUNTER — Other Ambulatory Visit: Payer: Self-pay | Admitting: Internal Medicine

## 2020-12-15 ENCOUNTER — Encounter: Payer: Self-pay | Admitting: Internal Medicine

## 2020-12-16 ENCOUNTER — Other Ambulatory Visit: Payer: Self-pay | Admitting: Internal Medicine

## 2020-12-16 ENCOUNTER — Encounter: Payer: Self-pay | Admitting: Internal Medicine

## 2020-12-16 ENCOUNTER — Other Ambulatory Visit (HOSPITAL_COMMUNITY): Payer: Self-pay

## 2020-12-16 DIAGNOSIS — U071 COVID-19: Secondary | ICD-10-CM | POA: Insufficient documentation

## 2020-12-16 MED ORDER — PAXLOVID 20 X 150 MG & 10 X 100MG PO TBPK
3.0000 | ORAL_TABLET | Freq: Two times a day (BID) | ORAL | 0 refills | Status: DC
  Filled 2020-12-16: qty 30, 5d supply, fill #0

## 2020-12-16 NOTE — Telephone Encounter (Signed)
FYI - patient will take 1/2 dose eliquis until he completes Paxlovid course

## 2020-12-20 ENCOUNTER — Encounter: Payer: Self-pay | Admitting: Internal Medicine

## 2020-12-23 ENCOUNTER — Encounter: Payer: Self-pay | Admitting: Internal Medicine

## 2020-12-24 ENCOUNTER — Other Ambulatory Visit: Payer: Self-pay | Admitting: Internal Medicine

## 2020-12-24 ENCOUNTER — Encounter: Payer: Self-pay | Admitting: Internal Medicine

## 2020-12-24 ENCOUNTER — Other Ambulatory Visit (HOSPITAL_COMMUNITY): Payer: Self-pay

## 2020-12-24 DIAGNOSIS — U071 COVID-19: Secondary | ICD-10-CM

## 2020-12-24 MED ORDER — PAXLOVID 20 X 150 MG & 10 X 100MG PO TBPK
3.0000 | ORAL_TABLET | Freq: Two times a day (BID) | ORAL | 0 refills | Status: AC
  Filled 2020-12-24: qty 30, 5d supply, fill #0

## 2020-12-29 DIAGNOSIS — Z20822 Contact with and (suspected) exposure to covid-19: Secondary | ICD-10-CM | POA: Diagnosis not present

## 2021-01-04 ENCOUNTER — Non-Acute Institutional Stay: Payer: PPO | Admitting: Adult Health

## 2021-01-04 ENCOUNTER — Encounter: Payer: Self-pay | Admitting: Adult Health

## 2021-01-04 ENCOUNTER — Other Ambulatory Visit: Payer: Self-pay

## 2021-01-04 ENCOUNTER — Encounter: Payer: Self-pay | Admitting: Internal Medicine

## 2021-01-04 VITALS — BP 136/82 | HR 68 | Temp 97.4°F | Ht 69.0 in | Wt 218.4 lb

## 2021-01-04 DIAGNOSIS — E785 Hyperlipidemia, unspecified: Secondary | ICD-10-CM

## 2021-01-04 DIAGNOSIS — I480 Paroxysmal atrial fibrillation: Secondary | ICD-10-CM | POA: Diagnosis not present

## 2021-01-04 DIAGNOSIS — I5032 Chronic diastolic (congestive) heart failure: Secondary | ICD-10-CM

## 2021-01-04 DIAGNOSIS — I509 Heart failure, unspecified: Secondary | ICD-10-CM

## 2021-01-04 DIAGNOSIS — R0602 Shortness of breath: Secondary | ICD-10-CM | POA: Diagnosis not present

## 2021-01-04 DIAGNOSIS — R252 Cramp and spasm: Secondary | ICD-10-CM

## 2021-01-04 DIAGNOSIS — U071 COVID-19: Secondary | ICD-10-CM | POA: Diagnosis not present

## 2021-01-04 HISTORY — DX: Heart failure, unspecified: I50.9

## 2021-01-04 MED ORDER — ALBUTEROL SULFATE HFA 108 (90 BASE) MCG/ACT IN AERS: 2.0000 | INHALATION_SPRAY | Freq: Four times a day (QID) | RESPIRATORY_TRACT | 0 refills | Status: DC | PRN

## 2021-01-04 NOTE — Patient Instructions (Signed)
Please go get a CXR  Try albuterol for shortness of breath   F/U with DR. Caryl Comes  Have labs done in the morning   Go to the ER for chest pain

## 2021-01-04 NOTE — Progress Notes (Signed)
Location:  Personal assistant  POS: clinic  Provider:  Cindi Carbon, [place] [phone]   Goals of Care:  Advanced Directives 01/04/2021  Does Patient Have a Medical Advance Directive? Yes  Type of Paramedic of [place];Out of facility DNR (pink MOST or yellow form)  Does patient want to make changes to medical advance directive? No - Patient declined  Copy of [place] in Chart? Yes - validated most recent copy scanned in chart (See row information)  Would patient like information on creating a medical advance directive? -  Pre-existing out of facility DNR order (yellow form or pink MOST form) Yellow form placed in chart (order not valid for inpatient use)     Chief Complaint  Patient presents with   Medical Management of Chronic Issues    Patient here today to establish care.    Quality Metric Gaps    Flu vaccine    HPI: Patient is a 87 y.o. male seen today for medical management of chronic diseases.   Had covid in mid July with symptoms of sore throat, runny nose, body aches, fatigue, slight fever. Took paxlovid. He as had chronic sob but feels it is worse since covid. Has some difficulty tolerating a mask. Before bed when he puts on his cpap machine he feels sob. He is feeling sob when exerting walking into the dining room at [facility], also some "tightness" in his chest that is relieved with rest. No symptoms at this time. Avoid exercise that he used to do due to the above complaint.  He is also having cramps in his arms and legs and has tried magnesium which helps some. He tried a soap bar in the bed which did not help. He has tried pedialyte which helped some.  He saw Dr Caryl Comes 10/01/20 with similar complaints and was felt to be in fluid overload at that time lasix was increased, along with isosorbide and crestor.  He did not feel better after the diuresis. His weight is down 5 lbs form that visit.  He is not having increased edema or pnd.  He has a hx of copd, smoking and pulmonary fibrosis and followed by Dr. Vaughan Browner with monitoring (CT's and PFTs).    Past Medical History:  Diagnosis Date   Abnormal CT scan, stomach 02/2014   Thickening of gastric fundus and cardia.  gastritis on EGD 03/2014   Allergic rhinitis due to pollen    Arthropathy, unspecified, site unspecified    Atherosclerosis    a. Noted by abdominal CT 02/2014 (h/o normal nuc 2011).   Atrial flutter ([place]) 02/24/14   s/p ablation 11/15   Cardiomyopathy ([place])    tachy mediated - a. TEE (10/15):  EF 30%;  b. Echo after NSR restored (10/15):  mild LVH, EF 55-60%, mild AS, mild AI, mild MR, mild to mod LAE, mild RAE   Diverticulosis    severe in descending, sigmoid colon.    ED (erectile dysfunction)    Esophageal stricture 03/2014   traversable with endoscope. not dilated.    Gastric AVM    GERD (gastroesophageal reflux disease) 03/2014   small HH and gastritis on EGD   GI bleed    Habitual alcohol use    Hemorrhoids    Hiatal hernia    Hyperlipidemia    Hypertension    Obesity    OSA on CPAP    PAF (paroxysmal atrial fibrillation) (HCC)    Prostatitis, unspecified  Sinus bradycardia    a. s/p STJ dual chamber PPM   Skin cancer    basal and squamous cell    Past Surgical History:  Procedure Laterality Date   ATRIAL FLUTTER ABLATION N/A 03/19/2014   RFCA of atrial flutter by Dr Caryl Comes   CARDIOVERSION N/A 02/24/2014   Procedure: CARDIOVERSION;  Surgeon: Lelon Perla, MD;  Location: [facility] ENDOSCOPY;  Service: Cardiovascular;  Laterality: N/A;   CARDIOVERSION Right 09/10/2014   Procedure: CARDIOVERSION;  Surgeon: Deboraha Sprang, MD;  Location: [facility] CATH LAB;  Service: Cardiovascular;  Laterality: Right;   CARDIOVERSION N/A 01/22/2018   Procedure: CARDIOVERSION;  Surgeon: Fay Records, MD;  Location: [facility] ENDOSCOPY;  Service: Cardiovascular;  Laterality: N/A;   COLONOSCOPY N/A 04/23/2015   Procedure:  COLONOSCOPY;  Surgeon: Ladene Artist, MD;  Location: [facility] ENDOSCOPY;  Service: Endoscopy;  Laterality: N/A;   ENTEROSCOPY N/A 06/12/2015   Procedure: ENTEROSCOPY;  Surgeon: Ladene Artist, MD;  Location: [facility] ENDOSCOPY;  Service: Endoscopy;  Laterality: N/A;   EP IMPLANTABLE DEVICE N/A 02/15/2016   Procedure: Pacemaker Implant;  Surgeon: Evans Lance, MD;  Location: [facility] CV LAB;  Service: Cardiovascular;  Laterality: N/A;   ESOPHAGOGASTRODUODENOSCOPY N/A 04/09/2014   Procedure: ESOPHAGOGASTRODUODENOSCOPY (EGD);  Surgeon: Inda Castle, MD;  Location: [place];  Service: Endoscopy;  Laterality: N/A;   ESOPHAGOGASTRODUODENOSCOPY N/A 04/23/2015   Procedure: ESOPHAGOGASTRODUODENOSCOPY (EGD);  Surgeon: Ladene Artist, MD;  Location: [facility] ENDOSCOPY;  Service: Endoscopy;  Laterality: N/A;   INGUINAL HERNIA REPAIR     right   INGUINAL HERNIA REPAIR     left   LEFT HEART CATH AND CORONARY ANGIOGRAPHY N/A 03/16/2017   Procedure: LEFT HEART CATH AND CORONARY ANGIOGRAPHY;  Surgeon: Burnell Blanks, MD;  Location: [facility] CV LAB;  Service: Cardiovascular;  Laterality: N/A;   ORIF fracture of the elbow     SKIN CANCER EXCISION  05/21/12   Squamous cell ca   TEE WITHOUT CARDIOVERSION N/A 02/24/2014   Procedure: TRANSESOPHAGEAL ECHOCARDIOGRAM (TEE);  Surgeon: Lelon Perla, MD;  Location: [facility] ENDOSCOPY;  Service: Cardiovascular;  Laterality: N/A;   TONSILLECTOMY      No Known Allergies  Outpatient Encounter Medications as of 01/04/2021  Medication Sig   apixaban (ELIQUIS) 5 MG TABS tablet Take 1 tablet (5 mg total) by mouth 2 (two) times daily.   diltiazem (CARDIZEM CD) 120 MG 24 hr capsule Take 1 capsule (120 mg total) by mouth 2 (two) times daily.   dofetilide (TIKOSYN) 500 MCG capsule Take 1 capsule (500 mcg total) by mouth 2 (two) times daily. Please keep upcoming appt in May 2022 with Dr. Caryl Comes before anymore refills. Thank you   ferrous sulfate 325 (65 FE) MG tablet Take 1  tablet (325 mg total) by mouth daily with breakfast.   FIBER PO Take by mouth.   furosemide (LASIX) 20 MG tablet TAKE ONE TABLET BY MOUTH ONCE DAILY   isosorbide mononitrate (IMDUR) 60 MG 24 hr tablet Take 1 tablet (60 mg total) by mouth daily.   Magnesium Aspartate 65 MG TABS Take 1 tablet by mouth daily.   metoprolol tartrate (LOPRESSOR) 25 MG tablet Take 1 tablet (25 mg total) by mouth 2 (two) times daily.   metroNIDAZOLE (METROGEL) 0.75 % gel Apply 1 application topically daily as needed (for rosacea).    Multiple Vitamins-Minerals (EYE VITAMINS) CAPS Take 1 capsule by mouth 2 (two) times daily.   Multiple Vitamins-Minerals (MULTIVITAMIN PO) Take 1 tablet by mouth 2 (  two) times daily.   nitroGLYCERIN (NITROSTAT) 0.4 MG SL tablet PLACE 1 TABLET UNDER THE TONGUE EVERY 5 MINUTES AS NEEDED FOR CHEST PAIN, MAY REPEAT FOR 3 DOSES.   pantoprazole (PROTONIX) 40 MG tablet Take 1 tablet (40 mg total) by mouth daily.   Probiotic Product (ALIGN PO) Take by mouth.   rosuvastatin (CRESTOR) 20 MG tablet Take 1 tablet (20 mg total) by mouth daily.   No facility-administered encounter medications on file as of 01/04/2021.    Review of Systems:  Review of Systems  Constitutional:  Positive for activity change. Negative for appetite change, chills, diaphoresis, fatigue, fever and unexpected weight change.  Respiratory:  Positive for shortness of breath (on exertion). Negative for cough, wheezing and stridor.   Cardiovascular:  Negative for chest pain, palpitations and leg swelling (mild).       Tightness of the mid chest area with exertion  Gastrointestinal:  Negative for abdominal distention, abdominal pain, constipation and diarrhea.  Genitourinary:  Negative for difficulty urinating and dysuria.  Musculoskeletal:  Negative for arthralgias, back pain, gait problem, joint swelling and myalgias.  Neurological:  Negative for dizziness, seizures, syncope, facial asymmetry, speech difficulty, weakness and  headaches.  Hematological:  Negative for adenopathy. Does not bruise/bleed easily.  Psychiatric/Behavioral:  Negative for agitation, behavioral problems and confusion.    Health Maintenance  Topic Date Due   INFLUENZA VACCINE  12/14/2020   TETANUS/TDAP  04/16/2028   COVID-19 Vaccine  Completed   PNA vac Low Risk Adult  Completed   Zoster Vaccines- Shingrix  Completed   HPV VACCINES  Aged Out    Physical Exam: Vitals:   01/04/21 1438  BP: 136/82  Pulse: 68  Temp: (!) 97.4 F (36.3 C)  SpO2: 98%  Weight: 218 lb 6.4 oz (99.1 kg)  Height: '5\' 9"'$  (1.753 m)   Body mass index is 32.25 kg/m. Physical Exam Vitals and nursing note reviewed.  Constitutional:      General: He is not in acute distress.    Appearance: He is not diaphoretic.  HENT:     Head: Normocephalic and atraumatic.  Neck:     Thyroid: No thyromegaly.     Vascular: No JVD.     Trachea: No tracheal deviation.  Cardiovascular:     Rate and Rhythm: Normal rate and regular rhythm.     Heart sounds: No murmur heard. Pulmonary:     Effort: Pulmonary effort is normal. No respiratory distress.     Breath sounds: Normal breath sounds. No wheezing.     Comments: Decreased bases but otherwise clear Abdominal:     General: Bowel sounds are normal. There is no distension.     Palpations: Abdomen is soft.     Tenderness: There is no abdominal tenderness.  Musculoskeletal:        General: No swelling, tenderness, deformity or signs of injury.     Comments: +1 edema to BLE   Lymphadenopathy:     Cervical: No cervical adenopathy.  Skin:    General: Skin is warm and dry.  Neurological:     Mental Status: He is alert and oriented to person, place, and time.     Cranial Nerves: No cranial nerve deficit.    Labs reviewed: Basic Metabolic Panel: Recent Labs    03/05/20 0953 08/07/20 1250  NA 138 138  K 4.8 4.6  CL 104 105  CO2 28 26  GLUCOSE 95 93  BUN 22 18  CREATININE 1.03 0.98  CALCIUM 9.4 9.1  MG  --   1.9  TSH 0.65  --    Liver Function Tests: Recent Labs    03/05/20 0953  AST 20  ALT 17  ALKPHOS 70  BILITOT 0.5  PROT 6.8  ALBUMIN 4.1   No results for input(s): LIPASE, AMYLASE in the last 8760 hours. No results for input(s): AMMONIA in the last 8760 hours. CBC: Recent Labs    02/24/20 1100 08/31/20 1040  WBC 8.3 8.1  NEUTROABS 5.4 5.4  HGB 14.2 12.8*  HCT 42.1 38.0*  MCV 99.1 97.6  PLT 193.0 209.0   Lipid Panel: Recent Labs    03/05/20 0953  CHOL 185  HDL 47.20  LDLCALC 124*  TRIG 71.0  CHOLHDL 4   No results found for: HGBA1C  Procedures since last visit: No results found.  Assessment/Plan  1. Shortness of breath -Try albuterol when symptoms are present 2 puff q 6 prn  - DG Chest 2 View; Future  2. COVID Has recovered with lingering sob slight worsening from baseline  3. Muscle cramps Will check electrolytes CBC and iron panel Did not find help with magnesium ?if this is due to the increase statin dosing.   4. PAF (paroxysmal atrial fibrillation) (HCC) Regular on exam with rate control (on lopressor, tikosyn and Cardizem) On Eliquis for CVA risk reduction Followed by Dr. Caryl Comes, sooner f/u recommended if possible  5. Hyperlipidemia with target LDL less than 100 Check lipids On crestor 20 mg   6. Chronic diastolic congestive heart failure (HCC) His weight is trending down, no crackles on exam Did not feel diuresis improved his symptoms of sob on exertion and chest tightness. Continue lasix 20 mg qd  It is difficult at this time to ascertain whether this is pulmonary or cardiac related. Will see if the albuterol helps him at all given he is a former smoker with lung disease. Will discuss his case with Dr. Caryl Comes and contact the pt once his labs have returned.    Labs/tests ordered:  * No order type specified *CBC CMP Lipid Iron panel F/U with me in 1 month so that we can further discuss his health. Today we focused on two acute complaints.    Total time 66mn:  time greater than 50% of total time spent doing pt counseling and coordination of care

## 2021-01-05 ENCOUNTER — Ambulatory Visit
Admission: RE | Admit: 2021-01-05 | Discharge: 2021-01-05 | Disposition: A | Discharge status: Home / Self Care | Payer: PPO | Source: Ambulatory Visit | Attending: Adult Health | Admitting: Adult Health

## 2021-01-05 ENCOUNTER — Other Ambulatory Visit: Payer: Self-pay | Admitting: Adult Health

## 2021-01-05 DIAGNOSIS — D649 Anemia, unspecified: Secondary | ICD-10-CM

## 2021-01-05 DIAGNOSIS — J189 Pneumonia, unspecified organism: Secondary | ICD-10-CM

## 2021-01-05 DIAGNOSIS — R0602 Shortness of breath: Secondary | ICD-10-CM | POA: Diagnosis not present

## 2021-01-05 MED ORDER — DOXYCYCLINE MONOHYDRATE 100 MG PO TABS: 100.0000 mg | ORAL_TABLET | Freq: Two times a day (BID) | ORAL | 0 refills | Status: DC

## 2021-01-07 DIAGNOSIS — D509 Iron deficiency anemia, unspecified: Secondary | ICD-10-CM | POA: Diagnosis not present

## 2021-01-07 DIAGNOSIS — G4733 Obstructive sleep apnea (adult) (pediatric): Secondary | ICD-10-CM | POA: Diagnosis not present

## 2021-01-07 DIAGNOSIS — I10 Essential (primary) hypertension: Secondary | ICD-10-CM | POA: Diagnosis not present

## 2021-01-07 DIAGNOSIS — I130 Hypertensive heart and chronic kidney disease with heart failure and stage 1 through stage 4 chronic kidney disease, or unspecified chronic kidney disease: Secondary | ICD-10-CM | POA: Diagnosis not present

## 2021-01-07 DIAGNOSIS — D649 Anemia, unspecified: Secondary | ICD-10-CM | POA: Diagnosis not present

## 2021-01-07 LAB — COMPREHENSIVE METABOLIC PANEL
Albumin: 4.2 (ref 3.5–5.0)
Albumin: 4.2 (ref 3.5–5.0)
Calcium: 9.2 (ref 8.7–10.7)
Calcium: 9.2 (ref 8.7–10.7)
Globulin: 2.2
Globulin: 2.2

## 2021-01-07 LAB — BASIC METABOLIC PANEL
BUN: 19 (ref 4–21)
BUN: 19 (ref 4–21)
CO2: 23 — AB (ref 13–22)
CO2: 23 — AB (ref 13–22)
Chloride: 103 (ref 99–108)
Chloride: 103 (ref 99–108)
Creatinine: 1 (ref 0.6–1.3)
Creatinine: 1 (ref 0.6–1.3)
Glucose: 105
Glucose: 105
Potassium: 4.5 (ref 3.4–5.3)
Potassium: 4.5 (ref 3.4–5.3)
Sodium: 138 (ref 137–147)
Sodium: 138 (ref 137–147)

## 2021-01-07 LAB — HEPATIC FUNCTION PANEL
ALT: 10 (ref 10–40)
ALT: 10 (ref 10–40)
AST: 18 (ref 14–40)
AST: 18 (ref 14–40)
Alkaline Phosphatase: 94 (ref 25–125)
Alkaline Phosphatase: 94 (ref 25–125)
Bilirubin, Total: 0.2
Bilirubin, Total: 0.2

## 2021-01-07 LAB — LIPID PANEL
Cholesterol: 98 (ref 0–200)
HDL: 38 (ref 35–70)
LDL Cholesterol: 51
Triglycerides: 50 (ref 40–160)

## 2021-01-07 LAB — CBC AND DIFFERENTIAL
HCT: 25 — AB (ref 41–53)
Hemoglobin: 8.1 — AB (ref 13.5–17.5)
Platelets: 268 (ref 150–399)
WBC: 6

## 2021-01-07 LAB — TSH: TSH: 0.63 (ref 0.41–5.90)

## 2021-01-07 LAB — IRON,TIBC AND FERRITIN PANEL
%SAT: 6.61
Iron: 24
TIBC: 360
UIBC: 336

## 2021-01-07 LAB — CBC: RBC: 2.89 — AB (ref 3.87–5.11)

## 2021-01-11 ENCOUNTER — Telehealth: Payer: Self-pay | Admitting: Oncology

## 2021-01-11 NOTE — Telephone Encounter (Signed)
Scheduled appt per 8/26 referral. Pt aware of appt date and time.  

## 2021-01-13 LAB — BRAIN NATRIURETIC PEPTIDE: B Natriuretic Peptide: 286

## 2021-01-14 ENCOUNTER — Telehealth: Payer: Self-pay | Admitting: Internal Medicine

## 2021-01-14 DIAGNOSIS — D509 Iron deficiency anemia, unspecified: Secondary | ICD-10-CM | POA: Diagnosis not present

## 2021-01-14 LAB — CBC AND DIFFERENTIAL
HCT: 25 — AB (ref 41–53)
Hemoglobin: 8 — AB (ref 13.5–17.5)
Platelets: 286 (ref 150–399)
WBC: 8.3
WBC: 8.3

## 2021-01-14 LAB — CBC: RBC: 2.86 — AB (ref 3.87–5.11)

## 2021-01-14 NOTE — Telephone Encounter (Signed)
Pt stating that he has internal bleeding. Pt stated that at last visit he was told that he has internal hemorrhoids.  Pt stated that his PCP informed him  that he needs to see his GI Dr due to the fact that his lab values are lower. Appt scheduled for pt with Alonza Bogus PA on 01/22/21 at 10:00. Pt made aware.

## 2021-01-15 MED ORDER — ROSUVASTATIN CALCIUM 10 MG PO TABS
10.0000 mg | ORAL_TABLET | Freq: Every day | ORAL | 3 refills | Status: DC
Start: 2021-01-15 — End: 2021-05-11

## 2021-01-19 ENCOUNTER — Other Ambulatory Visit: Payer: Self-pay

## 2021-01-19 ENCOUNTER — Other Ambulatory Visit: Payer: Self-pay | Admitting: Oncology

## 2021-01-19 ENCOUNTER — Other Ambulatory Visit: Payer: Self-pay | Admitting: *Deleted

## 2021-01-19 ENCOUNTER — Inpatient Hospital Stay: Payer: PPO

## 2021-01-19 ENCOUNTER — Inpatient Hospital Stay: Payer: PPO | Attending: Oncology | Admitting: Oncology

## 2021-01-19 VITALS — BP 129/64 | HR 87 | Temp 98.3°F | Resp 17 | Ht 69.0 in | Wt 220.2 lb

## 2021-01-19 DIAGNOSIS — Z87891 Personal history of nicotine dependence: Secondary | ICD-10-CM | POA: Diagnosis not present

## 2021-01-19 DIAGNOSIS — D649 Anemia, unspecified: Secondary | ICD-10-CM

## 2021-01-19 DIAGNOSIS — K5521 Angiodysplasia of colon with hemorrhage: Secondary | ICD-10-CM | POA: Diagnosis not present

## 2021-01-19 DIAGNOSIS — I429 Cardiomyopathy, unspecified: Secondary | ICD-10-CM | POA: Insufficient documentation

## 2021-01-19 LAB — IRON AND TIBC
Iron: 50 ug/dL (ref 42–163)
Saturation Ratios: 14 % — ABNORMAL LOW (ref 20–55)
TIBC: 371 ug/dL (ref 202–409)
UIBC: 321 ug/dL (ref 117–376)

## 2021-01-19 LAB — FERRITIN: Ferritin: 10 ng/mL — ABNORMAL LOW (ref 24–336)

## 2021-01-19 LAB — SAMPLE TO BLOOD BANK

## 2021-01-19 LAB — CBC WITH DIFFERENTIAL (CANCER CENTER ONLY)
Abs Immature Granulocytes: 0.02 10*3/uL (ref 0.00–0.07)
Basophils Absolute: 0.1 10*3/uL (ref 0.0–0.1)
Basophils Relative: 1 %
Eosinophils Absolute: 0.2 10*3/uL (ref 0.0–0.5)
Eosinophils Relative: 2 %
HCT: 24.5 % — ABNORMAL LOW (ref 39.0–52.0)
Hemoglobin: 7.3 g/dL — ABNORMAL LOW (ref 13.0–17.0)
Immature Granulocytes: 0 %
Lymphocytes Relative: 19 %
Lymphs Abs: 1.5 10*3/uL (ref 0.7–4.0)
MCH: 26.9 pg (ref 26.0–34.0)
MCHC: 29.8 g/dL — ABNORMAL LOW (ref 30.0–36.0)
MCV: 90.4 fL (ref 80.0–100.0)
Monocytes Absolute: 0.7 10*3/uL (ref 0.1–1.0)
Monocytes Relative: 9 %
Neutro Abs: 5.5 10*3/uL (ref 1.7–7.7)
Neutrophils Relative %: 69 %
Platelet Count: 294 10*3/uL (ref 150–400)
RBC: 2.71 MIL/uL — ABNORMAL LOW (ref 4.22–5.81)
RDW: 15.4 % (ref 11.5–15.5)
WBC Count: 8 10*3/uL (ref 4.0–10.5)
nRBC: 0 % (ref 0.0–0.2)

## 2021-01-19 LAB — PREPARE RBC (CROSSMATCH)

## 2021-01-19 LAB — VITAMIN B12: Vitamin B-12: 323 pg/mL (ref 180–914)

## 2021-01-19 NOTE — Progress Notes (Signed)
Reason for the request:    Anemia  HPI: I was asked by Donald Hawthorn, NP to evaluate Donald [name] for the evaluation of anemia.  He is an 87 year old man with history of cardiomyopathy, coronary disease among other comorbid conditions who was noted to have a hemoglobin of 8.1 on January 07, 2021.  His white cell count was normal with platelet count of 268.  His chemistries at that time showed normal kidney function and liver function test.  Repeat CBC on September 1 showed a hemoglobin of 8, hematocrit of 25.  Previous hemoglobin was 12.8 in April 2022 and was 14 in October 2021.  He did have episode of GI bleeding in the past due to AVM in 2017 she required hospitalization after presenting with a hemoglobin of 6.8. Iron studies obtained on August 25 showed iron level of 24 with saturation of 6.6%.  Ferritin was not obtained.  Clinically, he reports some fatigue and dyspnea on exertion but denied any chest pain or palpitation.  He had denies any hematochezia but does report melena although it is unclear whether this is related to his oral iron replacement therapy.  His Hemoccult testing has been positive.  He does not report any headaches, blurry vision, syncope or seizures. Does not report any fevers, chills or sweats.  Does not report any cough, wheezing or hemoptysis.  Does not report any chest pain, palpitation, orthopnea or leg edema.  Does not report any nausea, vomiting or abdominal pain.  Does not report any constipation or diarrhea.  Does not report any skeletal complaints.    Does not report frequency, urgency or hematuria.  Does not report any skin rashes or lesions. Does not report any heat or cold intolerance.  Does not report any lymphadenopathy or petechiae.  Does not report any anxiety or depression.  Remaining review of systems is negative.     Past Medical History:  Diagnosis Date   Abnormal CT scan, stomach 02/2014   Thickening of gastric fundus and cardia.  gastritis on EGD 03/2014    Allergic rhinitis due to pollen    Arthropathy, unspecified, site unspecified    Atherosclerosis    a. Noted by abdominal CT 02/2014 (h/o normal nuc 2011).   Atrial flutter ([place]) 02/24/14   s/p ablation 11/15   Cardiomyopathy ([place])    tachy mediated - a. TEE (10/15):  EF 30%;  b. Echo after NSR restored (10/15):  mild LVH, EF 55-60%, mild AS, mild AI, mild MR, mild to mod LAE, mild RAE   CHF (congestive heart failure) ([place]) 01/04/2021   Diverticulosis    severe in descending, sigmoid colon.    ED (erectile dysfunction)    Esophageal stricture 03/2014   traversable with endoscope. not dilated.    Gastric AVM    GERD (gastroesophageal reflux disease) 03/2014   small HH and gastritis on EGD   GI bleed    Habitual alcohol use    Hemorrhoids    Hiatal hernia    Hyperlipidemia    Hypertension    Obesity    OSA on CPAP    PAF (paroxysmal atrial fibrillation) (H[facility])    Prostatitis, unspecified    Sinus bradycardia    a. s/p STJ dual chamber PPM   Skin cancer    basal and squamous cell  :   Past Surgical History:  Procedure Laterality Date   ATRIAL FLUTTER ABLATION N/A 03/19/2014   RFCA of atrial flutter by Dr Caryl Comes   CARDIOVERSION N/A 02/24/2014  Procedure: CARDIOVERSION;  Surgeon: Lelon Perla, MD;  Location: [facility];  Service: Cardiovascular;  Laterality: N/A;   CARDIOVERSION Right 09/10/2014   Procedure: CARDIOVERSION;  Surgeon: Deboraha Sprang, MD;  Location: [facility] CATH LAB;  Service: Cardiovascular;  Laterality: Right;   CARDIOVERSION N/A 01/22/2018   Procedure: CARDIOVERSION;  Surgeon: Fay Records, MD;  Location: [facility] ENDOSCOPY;  Service: Cardiovascular;  Laterality: N/A;   COLONOSCOPY N/A 04/23/2015   Procedure: COLONOSCOPY;  Surgeon: Ladene Artist, MD;  Location: [facility] ENDOSCOPY;  Service: Endoscopy;  Laterality: N/A;   ENTEROSCOPY N/A 06/12/2015   Procedure: ENTEROSCOPY;  Surgeon: Ladene Artist, MD;  Location: [facility] ENDOSCOPY;  Service: Endoscopy;  Laterality: N/A;   EP  IMPLANTABLE DEVICE N/A 02/15/2016   Procedure: Pacemaker Implant;  Surgeon: Evans Lance, MD;  Location: [place] CV LAB;  Service: Cardiovascular;  Laterality: N/A;   ESOPHAGOGASTRODUODENOSCOPY N/A 04/09/2014   Procedure: ESOPHAGOGASTRODUODENOSCOPY (EGD);  Surgeon: Inda Castle, MD;  Location: [facility];  Service: Endoscopy;  Laterality: N/A;   ESOPHAGOGASTRODUODENOSCOPY N/A 04/23/2015   Procedure: ESOPHAGOGASTRODUODENOSCOPY (EGD);  Surgeon: Ladene Artist, MD;  Location: [facility] ENDOSCOPY;  Service: Endoscopy;  Laterality: N/A;   INGUINAL HERNIA REPAIR     right   INGUINAL HERNIA REPAIR     left   LEFT HEART CATH AND CORONARY ANGIOGRAPHY N/A 03/16/2017   Procedure: LEFT HEART CATH AND CORONARY ANGIOGRAPHY;  Surgeon: Burnell Blanks, MD;  Location: [place] CV LAB;  Service: Cardiovascular;  Laterality: N/A;   ORIF fracture of the elbow     SKIN CANCER EXCISION  05/21/12   Squamous cell ca   TEE WITHOUT CARDIOVERSION N/A 02/24/2014   Procedure: TRANSESOPHAGEAL ECHOCARDIOGRAM (TEE);  Surgeon: Lelon Perla, MD;  Location: [facility] ENDOSCOPY;  Service: Cardiovascular;  Laterality: N/A;   TONSILLECTOMY    :   Current Outpatient Medications:    albuterol (VENTOLIN HFA) 108 (90 Base) MCG/ACT inhaler, Inhale 2 puffs into the lungs every 6 (six) hours as needed for wheezing or shortness of breath., Disp: 8 g, Rfl: 0   apixaban (ELIQUIS) 5 MG TABS tablet, Take 1 tablet (5 mg total) by mouth 2 (two) times daily., Disp: 60 tablet, Rfl: 6   diltiazem (CARDIZEM CD) 120 MG 24 hr capsule, Take 1 capsule (120 mg total) by mouth 2 (two) times daily., Disp: 180 capsule, Rfl: 3   dofetilide (TIKOSYN) 500 MCG capsule, Take 1 capsule (500 mcg total) by mouth 2 (two) times daily. Please keep upcoming appt in May 2022 with Dr. Caryl Comes before anymore refills. Thank you, Disp: 60 capsule, Rfl: 11   doxycycline (ADOXA) 100 MG tablet, Take 1 tablet (100 mg total) by mouth 2 (two) times daily., Disp: 14  tablet, Rfl: 0   ferrous sulfate 325 (65 FE) MG tablet, Take 1 tablet (325 mg total) by mouth daily with breakfast., Disp: 60 tablet, Rfl: 3   FIBER PO, Take by mouth., Disp: , Rfl:    furosemide (LASIX) 20 MG tablet, TAKE ONE TABLET BY MOUTH ONCE DAILY, Disp: 90 tablet, Rfl: 3   isosorbide mononitrate (IMDUR) 60 MG 24 hr tablet, Take 1 tablet (60 mg total) by mouth daily., Disp: 90 tablet, Rfl: 3   Magnesium Aspartate 65 MG TABS, Take 1 tablet by mouth daily., Disp: , Rfl:    metoprolol tartrate (LOPRESSOR) 25 MG tablet, Take 1 tablet (25 mg total) by mouth 2 (two) times daily., Disp: 60 tablet, Rfl: 5   metroNIDAZOLE (METROGEL) 0.75 % gel, Apply  1 application topically daily as needed (for rosacea). , Disp: , Rfl:    Multiple Vitamins-Minerals (EYE VITAMINS) CAPS, Take 1 capsule by mouth 2 (two) times daily., Disp: , Rfl:    Multiple Vitamins-Minerals (MULTIVITAMIN PO), Take 1 tablet by mouth 2 (two) times daily., Disp: , Rfl:    nitroGLYCERIN (NITROSTAT) 0.4 MG SL tablet, PLACE 1 TABLET UNDER THE TONGUE EVERY 5 MINUTES AS NEEDED FOR CHEST PAIN, MAY REPEAT FOR 3 DOSES., Disp: 12 tablet, Rfl: 0   pantoprazole (PROTONIX) 40 MG tablet, Take 1 tablet (40 mg total) by mouth daily., Disp: 90 tablet, Rfl: 1   Probiotic Product (ALIGN PO), Take by mouth., Disp: , Rfl:    rosuvastatin (CRESTOR) 10 MG tablet, Take 1 tablet (10 mg total) by mouth daily., Disp: 30 tablet, Rfl: 3:  No Known Allergies:   Family History  Problem Relation Age of Onset   Cancer Mother        ovarian   Other Father        renal disease- grief over loss of spouse   Cancer Brother        prostate- died of hematologic disorder 2nd to chemo   Pulmonary fibrosis Brother    Diabetes Neg Hx    Coronary artery disease Neg Hx    Colon cancer Neg Hx    Stomach cancer Neg Hx    Rectal cancer Neg Hx   :   Social History   Socioeconomic History   Marital status: Married    Spouse name: Not on file   Number of children: 3    Years of education: 16   Highest education level: Not on file  Occupational History   Occupation: Land    Employer: RETIRED  Tobacco Use   Smoking status: Former    Packs/day: 0.00    Years: 40.00    Pack years: 0.00    Types: Cigarettes, Cigars    Quit date: 09/18/2008    Years since quitting: 12.3   Smokeless tobacco: Former    Types: Chew   Tobacco comments:    Has Occasional Cigar/ quit   Vaping Use   Vaping Use: Never used  Substance and Sexual Activity   Alcohol use: Yes    Alcohol/week: 28.0 standard drinks    Types: 21 Glasses of wine, 7 Shots of liquor per week    Comment: states he does want to drink less and cut back on the amount.    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 2 sons- '64, '63 , I daughter- '66, 4 grandchildren. work: Museum/gallery curator, retired but still consults. 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      No new stressors/       Consumption of caffeine-Coffee      Exercise- 30 mins swimming daily   Social Determinants of Health   Financial Resource Strain: Low Risk    Difficulty of Paying Living Expenses: Not hard at all  Food Insecurity: No Food Insecurity   Worried About Charity fundraiser in the Last Year: Never true   Ran Out of Food in the Last Year: Never true  Transportation Needs: No Transportation Needs   Lack of Transportation (Medical): No   Lack of Transportation (Non-Medical): No  Physical Activity:  Sufficiently Active   Days of Exercise per Week: 5 days   Minutes of Exercise per Session: 30 min  Stress: No Stress Concern Present   Feeling of Stress : Not at all  Social Connections: Socially Integrated   Frequency of Communication with Friends and Family: More than three times a week   Frequency of Social  Gatherings with Friends and Family: More than three times a week   Attends Religious Services: More than 4 times per year   Active Member of [employer] or Organizations: Yes   Attends Music therapist: More than 4 times per year   Marital Status: Married  Human resources officer Violence: Not on file  :  Pertinent items are noted in HPI.  Exam:  General appearance: alert and cooperative appeared without distress. Head: atraumatic without any abnormalities. Eyes: conjunctivae/corneas clear. PERRL.  Sclera anicteric. Throat: lips, mucosa, and tongue normal; without oral thrush or ulcers. Resp: clear to auscultation bilaterally without rhonchi, wheezes or dullness to percussion. Cardio: regular rate and rhythm, S1, S2 normal, no murmur, click, rub or gallop GI: soft, non-tender; bowel sounds normal; no masses,  no organomegaly Skin: Skin color, texture, turgor normal. No rashes or lesions Lymph nodes: Cervical, supraclavicular, and axillary nodes normal. Neurologic: Grossly normal without any motor, sensory or deep tendon reflexes. Musculoskeletal: No joint deformity or effusion.   DG Chest 2 View  Result Date: 01/05/2021 CLINICAL DATA:  87 year old male with shortness of breath EXAM: CHEST - 2 VIEW COMPARISON:  Chest CT 05/06/2020, plain film 06/08/2017 FINDINGS: Cardiomediastinal silhouette unchanged in size and contour. No evidence of central vascular congestion. No interlobular septal thickening. Coarsened interstitial markings more prominent than prior with interlobular septal thickening. No pleural effusion or pneumothorax. No confluent airspace disease. Unchanged cardiac pacing device. No acute displaced fracture. Degenerative changes of the spine. IMPRESSION: Increased prominence of the interstitial markings, with the differential including sequela of atypical infection as well as early pulmonary edema. Electronically Signed   By: Corrie Mckusick D.O.   On: 01/05/2021 13:52     Assessment and Plan:   87 year old with:  1. Normocytic, normochromic anemia noted on January 07, 2021.  His hemoglobin dropped from 12.8 and 8.1 between April and August of this year.  His iron studies showed iron level of 24 with saturation of 6.6%.  The differential diagnosis was discussed today with the patient.  Acute GI blood loss is the most likely etiology at this time.  Other etiologies are considered less likely.  Plasma cell disorder, myelodysplastic syndrome, vitamin deficiency need to be evaluated to complete his work-up.  Bone marrow biopsy would be deferred till all other etiologies have been fully evaluated.  From a management standpoint, he is symptomatic and I recommended proceeding with adequate cell transfusion.  IV iron infusion could also be recommended if his iron studies indicate severe deficiency.  Given his age and cardiac history I recommended proceeding with the transfusion to increase his hemoglobin quickest.  He is agreeable to proceed with this approach which will set up for him in the immediate future.  2.  GI bleed: He has heme positive stool with history of AVM.  He has GI follow-up in the near future.  3.  Follow-up: We will be in the next 2 months to follow his status.   45  minutes were dedicated to this visit. The time was spent on reviewing laboratory data, discussing treatment options, discussing differential diagnosis and answering questions regarding future plan.  A copy of this consult has been forwarded to the requesting physician.

## 2021-01-20 LAB — ERYTHROPOIETIN: Erythropoietin: 182.8 m[IU]/mL — ABNORMAL HIGH (ref 2.6–18.5)

## 2021-01-21 ENCOUNTER — Inpatient Hospital Stay: Payer: PPO

## 2021-01-21 ENCOUNTER — Other Ambulatory Visit: Payer: Self-pay

## 2021-01-21 DIAGNOSIS — D649 Anemia, unspecified: Secondary | ICD-10-CM | POA: Diagnosis not present

## 2021-01-21 MED ORDER — DIPHENHYDRAMINE HCL 25 MG PO CAPS
25.0000 mg | ORAL_CAPSULE | Freq: Once | ORAL | Status: AC
  Administered 2021-01-21: 25 mg via ORAL
  Filled 2021-01-21: qty 1

## 2021-01-21 MED ORDER — SODIUM CHLORIDE 0.9% IV SOLUTION
250.0000 mL | Freq: Once | INTRAVENOUS | Status: AC
  Administered 2021-01-21: 250 mL via INTRAVENOUS

## 2021-01-21 MED ORDER — ACETAMINOPHEN 325 MG PO TABS
650.0000 mg | ORAL_TABLET | Freq: Once | ORAL | Status: AC
  Administered 2021-01-21: 650 mg via ORAL
  Filled 2021-01-21: qty 2

## 2021-01-21 NOTE — Patient Instructions (Signed)
Blood Transfusion, Adult, Care After This sheet gives you information about how to care for yourself after your procedure. Your doctor may also give you more specific instructions. If you have problems or questions, contact your doctor. What can I expect after the procedure? After the procedure, it is common to have: Bruising and soreness at the IV site. A headache. Follow these instructions at home: Insertion site care   Follow instructions from your doctor about how to take care of your insertion site. This is where an IV tube was put into your vein. Make sure you: Wash your hands with soap and water before and after you change your bandage (dressing). If you cannot use soap and water, use hand sanitizer. Change your bandage as told by your doctor. Check your insertion site every day for signs of infection. Check for: Redness, swelling, or pain. Bleeding from the site. Warmth. Pus or a bad smell. General instructions Take over-the-counter and prescription medicines only as told by your doctor. Rest as told by your doctor. Go back to your normal activities as told by your doctor. Keep all follow-up visits as told by your doctor. This is important. Contact a doctor if: You have itching or red, swollen areas of skin (hives). You feel worried or nervous (anxious). You feel weak after doing your normal activities. You have redness, swelling, warmth, or pain around the insertion site. You have blood coming from the insertion site, and the blood does not stop with pressure. You have pus or a bad smell coming from the insertion site. Get help right away if: You have signs of a serious reaction. This may be coming from an allergy or the body's defense system (immune system). Signs include: Trouble breathing or shortness of breath. Swelling of the face or feeling warm (flushed). Fever or chills. Head, chest, or back pain. Dark pee (urine) or blood in the pee. Widespread rash. Fast  heartbeat. Feeling dizzy or light-headed. You may receive your blood transfusion in an outpatient setting. If so, you will be told whom to contact to report any reactions. These symptoms may be an emergency. Do not wait to see if the symptoms will go away. Get medical help right away. Call your local emergency services (911 in the [place] Do not drive yourself to the hospital. Summary Bruising and soreness at the IV site are common. Check your insertion site every day for signs of infection. Rest as told by your doctor. Go back to your normal activities as told by your doctor. Get help right away if you have signs of a serious reaction. This information is not intended to replace advice given to you by your health care provider. Make sure you discuss any questions you have with your health care provider. Document Revised: 08/27/2020 Document Reviewed: 10/25/2018 Elsevier Patient Education  2022 Elsevier Inc.  

## 2021-01-22 ENCOUNTER — Ambulatory Visit: Payer: PPO | Admitting: Gastroenterology

## 2021-01-22 ENCOUNTER — Encounter: Payer: Self-pay | Admitting: Gastroenterology

## 2021-01-22 VITALS — BP 124/58 | HR 64 | Ht 69.0 in | Wt 220.5 lb

## 2021-01-22 DIAGNOSIS — D509 Iron deficiency anemia, unspecified: Secondary | ICD-10-CM

## 2021-01-22 DIAGNOSIS — K2289 Other specified disease of esophagus: Secondary | ICD-10-CM

## 2021-01-22 DIAGNOSIS — K921 Melena: Secondary | ICD-10-CM

## 2021-01-22 DIAGNOSIS — K31819 Angiodysplasia of stomach and duodenum without bleeding: Secondary | ICD-10-CM

## 2021-01-22 DIAGNOSIS — Z7901 Long term (current) use of anticoagulants: Secondary | ICD-10-CM

## 2021-01-22 LAB — BPAM RBC
Blood Product Expiration Date: 202209252359
Blood Product Expiration Date: 202210012359
ISSUE DATE / TIME: 202209080743
ISSUE DATE / TIME: 202209080743
Unit Type and Rh: 9500
Unit Type and Rh: 9500

## 2021-01-22 LAB — MULTIPLE MYELOMA PANEL, SERUM
Albumin SerPl Elph-Mcnc: 3.5 g/dL (ref 2.9–4.4)
Albumin/Glob SerPl: 1.3 (ref 0.7–1.7)
Alpha 1: 0.3 g/dL (ref 0.0–0.4)
Alpha2 Glob SerPl Elph-Mcnc: 0.8 g/dL (ref 0.4–1.0)
B-Globulin SerPl Elph-Mcnc: 1 g/dL (ref 0.7–1.3)
Gamma Glob SerPl Elph-Mcnc: 0.8 g/dL (ref 0.4–1.8)
Globulin, Total: 2.7 g/dL (ref 2.2–3.9)
IgA: 261 mg/dL (ref 61–437)
IgG (Immunoglobin G), Serum: 834 mg/dL (ref 603–1613)
IgM (Immunoglobulin M), Srm: 26 mg/dL (ref 15–143)
Total Protein ELP: 6.2 g/dL (ref 6.0–8.5)

## 2021-01-22 LAB — TYPE AND SCREEN
ABO/RH(D): O NEG
Antibody Screen: NEGATIVE
Unit division: 0
Unit division: 0

## 2021-01-22 NOTE — Patient Instructions (Signed)
You have been scheduled for an endoscopy and colonoscopy. Please follow the written instructions given to you at your visit today. Please pick up your prep supplies at the pharmacy within the next 1-3 days. If you use inhalers (even only as needed), please bring them with you on the day of your procedure.  If you are age 85 or older, your body mass index should be between 23-30. Your Body mass index is 32.56 kg/m. If this is out of the aforementioned range listed, please consider follow up with your Primary Care Provider.  If you are age 68 or younger, your body mass index should be between 19-25. Your Body mass index is 32.56 kg/m. If this is out of the aformentioned range listed, please consider follow up with your Primary Care Provider.   __________________________________________________________  The [facility] providers would like to encourage you to use [facility] to communicate with providers for non-urgent requests or questions.  Due to long hold times on the telephone, sending your provider a message by [facility] may be a faster and more efficient way to get a response.  Please allow 48 business hours for a response.  Please remember that this is for non-urgent requests.

## 2021-01-22 NOTE — Progress Notes (Signed)
01/22/2021 Donald [name] [id number] 1932/10/23   HISTORY OF PRESENT ILLNESS: This is an 87 year old male who is a patient of Dr. Vena Rua.  He has a past medical history of iron deficiency anemia felt secondary to gastric angiectasia, history of colon polyps, colonic diverticulosis, internal hemorrhoids, IBS along with possible SIBO as well as history of atrial fibrillation on chronic Eliquis and coronary artery disease with pacemaker in place.  He had evaluation for iron deficiency anemia back last in late 2016/early 2017.  Since then he had been maintained on ferrous sulfate 325 mg daily.  Despite taking that regularly he has had a drop in his hemoglobin over the last 4 months.  Hgb was 12.8 g in April and has trended down to 7.3 g just a couple of days ago.  His iron studies are low as well.  He was having some shortness of breath and was symptomatic so he was given 2 units of packed red blood cells yesterday.  He says that since receiving those he feels "like a new man".  Dr. Alen Blew had mentioned possibly giving him some IV iron.  The patient tells me that his PCP checked his stool for blood and it was positive, but I am not able to see that stuff in the computer system.  He does not see any bright red blood per rectum.  He says that his stools are always darker in color because of the iron supplementation.  He is here today at the request of his PCP, Royal Hawthorn, NP, for evaluation of symptomatic anemia.  He also follows with Dr. Alen Blew.  He is on Eliquis daily for his PAF that is prescribed by his cardiologist Dr. Caryl Comes.   EGD 05/2015: 1. Stricture at the gastroesophageal junction 2. Erosive gastritis with adherent blood in the gastric fundus 3. Small hiatal hernia   Colonoscopy 04/2015: 1. Semi-pedunculated polyp in the descending colon; polypectomy performed with a cold snare--Sessile serrated polyp. 2. Moderate diverticulosis in the sigmoid colon 3. The colonic mucosa otherwise  appeared normal to the hepatic flexure 4. Grade I internal hemorrhoids and external hemorrhoids  EGD 04/2015: 1. 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 Stricture at the gastroesophageal junction  Past Medical History:  Diagnosis Date   Abnormal CT scan, stomach 02/2014   Thickening of gastric fundus and cardia.  gastritis on EGD 03/2014   Allergic rhinitis due to pollen    Arthropathy, unspecified, site unspecified    Atherosclerosis    a. Noted by abdominal CT 02/2014 (h/o normal nuc 2011).   Atrial flutter ([place]) 02/24/2014   s/p ablation 11/15   Cardiomyopathy ([place])    tachy mediated - a. TEE (10/15):  EF 30%;  b. Echo after NSR restored (10/15):  mild LVH, EF 55-60%, mild AS, mild AI, mild MR, mild to mod LAE, mild RAE   CHF (congestive heart failure) ([place]) 01/04/2021   COVID-19 11/2020   Diverticulosis    severe in descending, sigmoid colon.    ED (erectile dysfunction)    Esophageal stricture 03/2014   traversable with endoscope. not dilated.    Gastric AVM    GERD (gastroesophageal reflux disease) 03/2014   small HH and gastritis on EGD   GI bleed    Habitual alcohol use    Hemorrhoids    Hiatal hernia    Hyperlipidemia    Hypertension    Obesity    OSA on CPAP    PAF (  paroxysmal atrial fibrillation) (HCC)    Prostatitis, unspecified    Sinus bradycardia    a. s/p STJ dual chamber PPM   Skin cancer    basal and squamous cell   Past Surgical History:  Procedure Laterality Date   ATRIAL FLUTTER ABLATION N/A 03/19/2014   RFCA of atrial flutter by Dr Caryl Comes   CARDIOVERSION N/A 02/24/2014   Procedure: CARDIOVERSION;  Surgeon: Lelon Perla, MD;  Location: [place];  Service: Cardiovascular;  Laterality: N/A;   CARDIOVERSION Right 09/10/2014   Procedure: CARDIOVERSION;  Surgeon: Deboraha Sprang, MD;  Location: [facility] CATH LAB;  Service: Cardiovascular;  Laterality: Right;   CARDIOVERSION N/A 01/22/2018    Procedure: CARDIOVERSION;  Surgeon: Fay Records, MD;  Location: [facility] ENDOSCOPY;  Service: Cardiovascular;  Laterality: N/A;   COLONOSCOPY N/A 04/23/2015   Procedure: COLONOSCOPY;  Surgeon: Ladene Artist, MD;  Location: [facility] ENDOSCOPY;  Service: Endoscopy;  Laterality: N/A;   ENTEROSCOPY N/A 06/12/2015   Procedure: ENTEROSCOPY;  Surgeon: Ladene Artist, MD;  Location: [facility] ENDOSCOPY;  Service: Endoscopy;  Laterality: N/A;   EP IMPLANTABLE DEVICE N/A 02/15/2016   Procedure: Pacemaker Implant;  Surgeon: Evans Lance, MD;  Location: [place] CV LAB;  Service: Cardiovascular;  Laterality: N/A;   ESOPHAGOGASTRODUODENOSCOPY N/A 04/09/2014   Procedure: ESOPHAGOGASTRODUODENOSCOPY (EGD);  Surgeon: Inda Castle, MD;  Location: [place];  Service: Endoscopy;  Laterality: N/A;   ESOPHAGOGASTRODUODENOSCOPY N/A 04/23/2015   Procedure: ESOPHAGOGASTRODUODENOSCOPY (EGD);  Surgeon: Ladene Artist, MD;  Location: [facility] ENDOSCOPY;  Service: Endoscopy;  Laterality: N/A;   INGUINAL HERNIA REPAIR     right   INGUINAL HERNIA REPAIR     left   LEFT HEART CATH AND CORONARY ANGIOGRAPHY N/A 03/16/2017   Procedure: LEFT HEART CATH AND CORONARY ANGIOGRAPHY;  Surgeon: Burnell Blanks, MD;  Location: [place] CV LAB;  Service: Cardiovascular;  Laterality: N/A;   ORIF fracture of the elbow     SKIN CANCER EXCISION  05/21/12   Squamous cell ca   TEE WITHOUT CARDIOVERSION N/A 02/24/2014   Procedure: TRANSESOPHAGEAL ECHOCARDIOGRAM (TEE);  Surgeon: Lelon Perla, MD;  Location: [facility] ENDOSCOPY;  Service: Cardiovascular;  Laterality: N/A;   TONSILLECTOMY      reports that he quit smoking about 12 years ago. His smoking use included cigarettes and cigars. He has quit using smokeless tobacco.  His smokeless tobacco use included chew. He reports current alcohol use of about 28.0 standard drinks per week. He reports that he does not use drugs. family history includes Cancer in his brother and mother; Other in his  father; Pulmonary fibrosis in his brother. No Known Allergies    Outpatient Encounter Medications as of 01/22/2021  Medication Sig   albuterol (VENTOLIN HFA) 108 (90 Base) MCG/ACT inhaler Inhale 2 puffs into the lungs every 6 (six) hours as needed for wheezing or shortness of breath.   apixaban (ELIQUIS) 5 MG TABS tablet Take 1 tablet (5 mg total) by mouth 2 (two) times daily.   diltiazem (CARDIZEM CD) 120 MG 24 hr capsule Take 1 capsule (120 mg total) by mouth 2 (two) times daily.   dofetilide (TIKOSYN) 500 MCG capsule Take 1 capsule (500 mcg total) by mouth 2 (two) times daily. Please keep upcoming appt in May 2022 with Dr. Caryl Comes before anymore refills. Thank you   ferrous sulfate 325 (65 FE) MG tablet Take 1 tablet (325 mg total) by mouth daily with breakfast.   FIBER PO Take by mouth.   furosemide (  LASIX) 20 MG tablet TAKE ONE TABLET BY MOUTH ONCE DAILY   isosorbide mononitrate (IMDUR) 60 MG 24 hr tablet Take 1 tablet (60 mg total) by mouth daily.   Magnesium Aspartate 65 MG TABS Take 1 tablet by mouth daily.   metoprolol tartrate (LOPRESSOR) 25 MG tablet Take 1 tablet (25 mg total) by mouth 2 (two) times daily.   metroNIDAZOLE (METROGEL) 0.75 % gel Apply 1 application topically daily as needed (for rosacea).    Multiple Vitamins-Minerals (EYE VITAMINS) CAPS Take 1 capsule by mouth 2 (two) times daily.   Multiple Vitamins-Minerals (MULTIVITAMIN PO) Take 1 tablet by mouth 2 (two) times daily.   nitroGLYCERIN (NITROSTAT) 0.4 MG SL tablet PLACE 1 TABLET UNDER THE TONGUE EVERY 5 MINUTES AS NEEDED FOR CHEST PAIN, MAY REPEAT FOR 3 DOSES.   pantoprazole (PROTONIX) 40 MG tablet Take 1 tablet (40 mg total) by mouth daily.   Probiotic Product (ALIGN PO) Take by mouth.   rosuvastatin (CRESTOR) 10 MG tablet Take 1 tablet (10 mg total) by mouth daily.   No facility-administered encounter medications on file as of 01/22/2021.    REVIEW OF SYSTEMS  : All other systems reviewed and negative except where  noted in the History of Present Illness.   PHYSICAL EXAM: BP (!) 124/58   Pulse 64   Ht '5\' 9"'$  (1.753 m)   Wt 220 lb 8 oz (100 kg)   BMI 32.56 kg/m  General: Well developed white male in no acute distress Head: Normocephalic and atraumatic Eyes:  Sclerae anicteric, conjunctiva pink. Ears: Normal auditory acuity Lungs: Clear throughout to auscultation; no W/R/R. Heart: Regular rate and rhythm; no M/R/G. Abdomen: Soft, non-distended.  BS present.  Non-tender. Rectal:  Will be done at the time of colonoscopy. Musculoskeletal: Symmetrical with no gross deformities  Skin: No lesions on visible extremities Extremities: No edema  Neurological: Alert oriented x 4, grossly non-focal Psychological:  Alert and cooperative. Normal mood and affect  ASSESSMENT AND PLAN: *Iron deficiency anemia and positive blood in stools: This has been an issue for him in the past, the last late 2016/early 2017.  He had been doing well since that time, but over the past 4 months his hemoglobin has been trending down.  12.8 g in April and down to 7.3 g just a couple of days ago.  He has received 2 units of packed red blood cells.  Ongoingly he has been on ferrous sulfate once a day.  Hematology mentioned possible IV iron, but we will leave that to them.  He tells me that he had his stool tested for blood and it was positive, but cannot see any results in the computer.  Has a history of gastric angiectasia.  We will plan for both EGD and colonoscopy with Dr. Bryan Lemma due to sooner availability.   *Chronic anticoagulation with Eliquis for history of PAF:  Will hold Eliquis for 2 days prior to endoscopic procedures - will instruct when and how to resume after procedure. Benefits and risks of procedure explained including risks of bleeding, perforation, infection, missed lesions, reactions to medications and possible need for hospitalization and surgery for complications. Additional rare but real risk of stroke or other  vascular clotting events off of Eliquis also explained and need to seek urgent help if any signs of these problems occur. Will communicate by phone or EMR with patient's prescribing provider, Dr. Caryl Comes, to confirm that holding Eliquis is reasonable in this case.     CC:  Virgie Dad,  MD CC:  Royal Hawthorn, NP

## 2021-01-25 ENCOUNTER — Other Ambulatory Visit: Payer: Self-pay | Admitting: Adult Health

## 2021-01-25 DIAGNOSIS — G4733 Obstructive sleep apnea (adult) (pediatric): Secondary | ICD-10-CM | POA: Diagnosis not present

## 2021-01-26 DIAGNOSIS — Z85828 Personal history of other malignant neoplasm of skin: Secondary | ICD-10-CM | POA: Diagnosis not present

## 2021-01-26 DIAGNOSIS — D692 Other nonthrombocytopenic purpura: Secondary | ICD-10-CM | POA: Diagnosis not present

## 2021-01-26 DIAGNOSIS — L821 Other seborrheic keratosis: Secondary | ICD-10-CM | POA: Diagnosis not present

## 2021-01-26 DIAGNOSIS — D225 Melanocytic nevi of trunk: Secondary | ICD-10-CM | POA: Diagnosis not present

## 2021-01-26 DIAGNOSIS — L570 Actinic keratosis: Secondary | ICD-10-CM | POA: Diagnosis not present

## 2021-01-28 NOTE — Progress Notes (Signed)
Addendum: Reviewed and agree with assessment and management plan. Agree with repeat upper and lower endoscopies. Kemiya Batdorf, Lajuan Lines, MD

## 2021-01-29 NOTE — Progress Notes (Signed)
Agree with the assessment and plan as outlined by Alonza Bogus, PA-C.  Agree with plan for expedited EGD/colonoscopy for diagnostic and potentially therapeutic intent as outlined.  Nell Gales, DO, [facility]

## 2021-02-01 ENCOUNTER — Encounter: Payer: PPO | Admitting: Adult Health

## 2021-02-03 ENCOUNTER — Ambulatory Visit (AMBULATORY_SURGERY_CENTER): Payer: PPO | Admitting: Gastroenterology

## 2021-02-03 ENCOUNTER — Other Ambulatory Visit (INDEPENDENT_AMBULATORY_CARE_PROVIDER_SITE_OTHER): Payer: PPO

## 2021-02-03 ENCOUNTER — Other Ambulatory Visit: Payer: Self-pay

## 2021-02-03 ENCOUNTER — Encounter: Payer: Self-pay | Admitting: Gastroenterology

## 2021-02-03 VITALS — BP 118/68 | HR 63 | Temp 97.1°F | Resp 14 | Ht 69.0 in | Wt 220.0 lb

## 2021-02-03 DIAGNOSIS — C182 Malignant neoplasm of ascending colon: Secondary | ICD-10-CM | POA: Diagnosis not present

## 2021-02-03 DIAGNOSIS — K2990 Gastroduodenitis, unspecified, without bleeding: Secondary | ICD-10-CM

## 2021-02-03 DIAGNOSIS — J849 Interstitial pulmonary disease, unspecified: Secondary | ICD-10-CM

## 2021-02-03 DIAGNOSIS — D128 Benign neoplasm of rectum: Secondary | ICD-10-CM

## 2021-02-03 DIAGNOSIS — K641 Second degree hemorrhoids: Secondary | ICD-10-CM

## 2021-02-03 DIAGNOSIS — D122 Benign neoplasm of ascending colon: Secondary | ICD-10-CM | POA: Diagnosis not present

## 2021-02-03 DIAGNOSIS — K3189 Other diseases of stomach and duodenum: Secondary | ICD-10-CM | POA: Diagnosis not present

## 2021-02-03 DIAGNOSIS — K2970 Gastritis, unspecified, without bleeding: Secondary | ICD-10-CM | POA: Diagnosis not present

## 2021-02-03 DIAGNOSIS — C184 Malignant neoplasm of transverse colon: Secondary | ICD-10-CM

## 2021-02-03 DIAGNOSIS — K921 Melena: Secondary | ICD-10-CM

## 2021-02-03 DIAGNOSIS — D125 Benign neoplasm of sigmoid colon: Secondary | ICD-10-CM

## 2021-02-03 DIAGNOSIS — K5730 Diverticulosis of large intestine without perforation or abscess without bleeding: Secondary | ICD-10-CM

## 2021-02-03 DIAGNOSIS — K621 Rectal polyp: Secondary | ICD-10-CM | POA: Diagnosis not present

## 2021-02-03 DIAGNOSIS — D123 Benign neoplasm of transverse colon: Secondary | ICD-10-CM

## 2021-02-03 DIAGNOSIS — K31819 Angiodysplasia of stomach and duodenum without bleeding: Secondary | ICD-10-CM | POA: Diagnosis not present

## 2021-02-03 DIAGNOSIS — K6389 Other specified diseases of intestine: Secondary | ICD-10-CM

## 2021-02-03 DIAGNOSIS — C180 Malignant neoplasm of cecum: Secondary | ICD-10-CM

## 2021-02-03 DIAGNOSIS — K5520 Angiodysplasia of colon without hemorrhage: Secondary | ICD-10-CM

## 2021-02-03 DIAGNOSIS — D509 Iron deficiency anemia, unspecified: Secondary | ICD-10-CM | POA: Diagnosis not present

## 2021-02-03 DIAGNOSIS — K449 Diaphragmatic hernia without obstruction or gangrene: Secondary | ICD-10-CM | POA: Diagnosis not present

## 2021-02-03 DIAGNOSIS — Z7901 Long term (current) use of anticoagulants: Secondary | ICD-10-CM | POA: Diagnosis not present

## 2021-02-03 DIAGNOSIS — K2289 Other specified disease of esophagus: Secondary | ICD-10-CM | POA: Diagnosis not present

## 2021-02-03 LAB — BUN: BUN: 12 mg/dL (ref 6–23)

## 2021-02-03 LAB — CREATININE, SERUM: Creatinine, Ser: 0.91 mg/dL (ref 0.40–1.50)

## 2021-02-03 MED ORDER — PANTOPRAZOLE SODIUM 40 MG PO TBEC: 40.0000 mg | DELAYED_RELEASE_TABLET | Freq: Two times a day (BID) | ORAL | 6 refills | Status: DC

## 2021-02-03 MED ORDER — SODIUM CHLORIDE 0.9 % IV SOLN: 500.0000 mL | Freq: Once | INTRAVENOUS | Status: DC

## 2021-02-03 NOTE — Op Note (Signed)
[facility] Patient Name: Donald [name] Procedure Date: 02/03/2021 10:16 AM MRN: [id number] Endoscopist: Gerrit Heck , MD Age: 87 Referring MD:  Date of Birth: [birth year] Gender: Male Account #: [id number] Procedure:                Colonoscopy Indications:              Hematochezia, Heme positive stool, Iron deficiency                            anemia Medicines:                Monitored Anesthesia Care Procedure:                Pre-Anesthesia Assessment:                           - Prior to the procedure, a History and Physical                            was performed, and patient medications and                            allergies were reviewed. The patient's tolerance of                            previous anesthesia was also reviewed. The risks                            and benefits of the procedure and the sedation                            options and risks were discussed with the patient.                            All questions were answered, and informed consent                            was obtained. Prior Anticoagulants: The patient has                            taken Eliquis (apixaban), last dose was 2 days                            prior to procedure. ASA Grade Assessment: III - A                            patient with severe systemic disease. After                            reviewing the risks and benefits, the patient was                            deemed in satisfactory condition to undergo the  procedure.                           After obtaining informed consent, the colonoscope                            was passed under direct vision. Throughout the                            procedure, the patient's blood pressure, pulse, and                            oxygen saturations were monitored continuously. The                            Olympus CF-HQ190L [id number]) Colonoscope was                            introduced  through the anus and advanced to the the                            cecum, identified by appendiceal orifice and                            ileocecal valve. The colonoscopy was technically                            difficult and complex due to significant looping                            and a tortuous colon. Successful completion of the                            procedure was aided by changing the patient to a                            supine position and using manual pressure. The                            patient tolerated the procedure well. The quality                            of the bowel preparation was good. The ileocecal                            valve, appendiceal orifice, and rectum were                            photographed. Scope In: 10:56:06 AM Scope Out: 11:40:57 AM Scope Withdrawal Time: 0 hours 28 minutes 16 seconds  Total Procedure Duration: 0 hours 44 minutes 51 seconds  Findings:                 Skin tags were found on perianal exam.  A frond-like/villous large mass was found in the                            cecum. Oozing was present. This was biopsied with a                            cold forceps for histology. Estimated blood loss                            was minimal.                           Three sessile polyps were found in the cecum and on                            the distal side of the appendiceal orifice. The                            polyps were 4 to 15 mm in size. These were not                            resected due to suboptimal positioning (due to                            exessive looping) and that these will be resected                            at the time of any surgical management of the above                            mass.                           Five sessile polyps were found in the transverse                            colon (3) and ascending colon (2). The polyps were                            3  to 6 mm in size. These polyps were removed with a                            cold snare. Resection and retrieval were complete.                            Estimated blood loss was minimal.                           A 10 mm polyp was found in the sigmoid colon. The                            polyp was sessile. The polyp was removed with a  cold snare. Resection and retrieval were complete.                            Estimated blood loss was minimal.                           Two sessile polyps were found in the rectum. The                            polyps were 2 to 3 mm in size. These polyps were                            removed with a cold biopsy forceps. Resection and                            retrieval were complete. Estimated blood loss was                            minimal.                           Multiple small and large-mouthed diverticula were                            found in the sigmoid colon and transverse colon.                           Non-bleeding internal hemorrhoids were found during                            retroflexion. The hemorrhoids were medium-sized and                            Grade II (internal hemorrhoids that prolapse but                            reduce spontaneously).                           The ascending colon revealed significantly                            excessive looping. Advancing the scope required                            changing the patient to a supine position and using                            manual pressure.                           A few medium-sized angioectasias without bleeding                            were found in the ascending colon and in  the cecum. Complications:            No immediate complications. Estimated Blood Loss:     Estimated blood loss was minimal. Impression:               - Perianal skin tags found on perianal exam.                           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 Biopsied.                           - Three 4 to 15 mm polyps in the cecum and at the                            appendiceal orifice.                           - Five 3 to 6 mm polyps in the transverse colon and                            in the ascending colon, removed with a cold snare.                            Resected and retrieved.                           - One 10 mm polyp in the sigmoid colon, removed                            with a cold snare. Resected and retrieved.                           - Two 2 to 3 mm polyps in the rectum, removed with                            a cold biopsy forceps. Resected and retrieved.                           - Diverticulosis in the sigmoid colon and in the                            transverse colon.                           - Non-bleeding internal hemorrhoids.                           - There was significant looping of the colon.                           - A few non-bleeding colonic angioectasias. Recommendation:           - Patient has a contact number available for  emergencies. The signs and symptoms of potential                            delayed complications were discussed with the                            patient. Return to normal activities tomorrow.                            Written discharge instructions were provided to the                            patient.                           - Resume previous diet.                           - Continue present medications.                           - Await pathology results.                           - Refer to a colo-rectal surgeon at appointment to                            be scheduled.                           - Perform a CT scan (computed tomography) of                            abdomen with contrast and pelvis with contrast at                            the next available appointment.                           - Proceed with CT chest high  resolution as already                            ordered by the Pulmonary Clinic.                           - Plan for referral to the Oncology Clinic pending                            pathology results.                           - Check CEA level.                           - Return to GI clinic in 2-4 weeks. Gerrit Heck, MD 02/03/2021 12:02:54 PM

## 2021-02-03 NOTE — Progress Notes (Signed)
GASTROENTEROLOGY PROCEDURE H&P NOTE   Primary Care Physician: Virgie Dad, MD    Reason for Procedure:   Iron deficiency anemia, history of colon polyps, history of gastric AVMs, acute on chronic anemia, FOBT+  Plan:    EGD, colonsocopy  Patient is appropriate for endoscopic procedure(s) in the ambulatory ([place]) setting.  The nature of the procedure, as well as the risks, benefits, and alternatives were carefully and thoroughly reviewed with the patient. Ample time for discussion and questions allowed. The patient understood, was satisfied, and agreed to proceed.     HPI: Donald [name] is a 87 y.o. male who presents for EGD and colonoscopy for evaluation of acute on chronic IDA along with FOBT+ stools.  Patient was most recently seen in the Gastroenterology Clinic on 01/22/2021 by Alonza Bogus, PA-C.  No interval change in medical history since that appointment. Please refer to that note for full details regarding GI history and clinical presentation.   Has been holding Eliquis for 2 days.  Endoscopic Hx: EGD 05/2015: 1. Stricture at the gastroesophageal junction 2. Erosive gastritis with adherent blood in the gastric fundus 3. Small hiatal hernia     Colonoscopy 04/2015: 1. Semi-pedunculated polyp in the descending colon; polypectomy performed with a cold snare--Sessile serrated polyp. 2. Moderate diverticulosis in the sigmoid colon 3. The colonic mucosa otherwise appeared normal to the hepatic flexure 4. Grade I internal hemorrhoids and external hemorrhoids   EGD 04/2015: 1. 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 Stricture at the gastroesophageal junction  Past Medical History:  Diagnosis Date   Abnormal CT scan, stomach 02/2014   Thickening of gastric fundus and cardia.  gastritis on EGD 03/2014   Allergic rhinitis due to pollen    Arthropathy, unspecified, site unspecified    Atherosclerosis    a.  Noted by abdominal CT 02/2014 (h/o normal nuc 2011).   Atrial flutter ([place]) 02/24/2014   s/p ablation 11/15   Cardiomyopathy ([place])    tachy mediated - a. TEE (10/15):  EF 30%;  b. Echo after NSR restored (10/15):  mild LVH, EF 55-60%, mild AS, mild AI, mild MR, mild to mod LAE, mild RAE   CHF (congestive heart failure) ([place]) 01/04/2021   COVID-19 11/2020   Diverticulosis    severe in descending, sigmoid colon.    ED (erectile dysfunction)    Emphysema of lung ([place])    Esophageal stricture 03/2014   traversable with endoscope. not dilated.    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   Skin cancer    basal and squamous cell   Sleep apnea     Past Surgical History:  Procedure Laterality Date   ATRIAL FLUTTER ABLATION N/A 03/19/2014   RFCA of atrial flutter by Dr Caryl Comes   CARDIOVERSION N/A 02/24/2014   Procedure: CARDIOVERSION;  Surgeon: Lelon Perla, MD;  Location: [facility] ENDOSCOPY;  Service: Cardiovascular;  Laterality: N/A;   CARDIOVERSION Right 09/10/2014   Procedure: CARDIOVERSION;  Surgeon: Revonda Standard  Caryl Comes, MD;  Location: [facility] CATH LAB;  Service: Cardiovascular;  Laterality: Right;   CARDIOVERSION N/A 01/22/2018   Procedure: CARDIOVERSION;  Surgeon: Fay Records, MD;  Location: [facility] ENDOSCOPY;  Service: Cardiovascular;  Laterality: N/A;   COLONOSCOPY N/A 04/23/2015   Procedure: COLONOSCOPY;  Surgeon: Ladene Artist, MD;  Location: [facility] ENDOSCOPY;  Service: Endoscopy;  Laterality: N/A;   COLONOSCOPY     ENTEROSCOPY N/A 06/12/2015   Procedure: ENTEROSCOPY;  Surgeon: Ladene Artist, MD;  Location: [facility] ENDOSCOPY;  Service: Endoscopy;  Laterality:  N/A;   EP IMPLANTABLE DEVICE N/A 02/15/2016   Procedure: Pacemaker Implant;  Surgeon: Evans Lance, MD;  Location: [place] CV LAB;  Service: Cardiovascular;  Laterality: N/A;   ESOPHAGOGASTRODUODENOSCOPY N/A 04/09/2014   Procedure: ESOPHAGOGASTRODUODENOSCOPY (EGD);  Surgeon: Inda Castle, MD;  Location: [place];  Service: Endoscopy;  Laterality: N/A;   ESOPHAGOGASTRODUODENOSCOPY N/A 04/23/2015   Procedure: ESOPHAGOGASTRODUODENOSCOPY (EGD);  Surgeon: Ladene Artist, MD;  Location: [facility] ENDOSCOPY;  Service: Endoscopy;  Laterality: N/A;   INGUINAL HERNIA REPAIR     right   INGUINAL HERNIA REPAIR     left   LEFT HEART CATH AND CORONARY ANGIOGRAPHY N/A 03/16/2017   Procedure: LEFT HEART CATH AND CORONARY ANGIOGRAPHY;  Surgeon: Burnell Blanks, MD;  Location: [place] CV LAB;  Service: Cardiovascular;  Laterality: N/A;   ORIF fracture of the elbow     SKIN CANCER EXCISION  05/21/2012   Squamous cell ca   TEE WITHOUT CARDIOVERSION N/A 02/24/2014   Procedure: TRANSESOPHAGEAL ECHOCARDIOGRAM (TEE);  Surgeon: Lelon Perla, MD;  Location: [facility] ENDOSCOPY;  Service: Cardiovascular;  Laterality: N/A;   TONSILLECTOMY     UPPER GASTROINTESTINAL ENDOSCOPY      Prior to Admission medications   Medication Sig Start Date End Date Taking? Authorizing Provider  albuterol (VENTOLIN HFA) 108 (90 Base) MCG/ACT inhaler Inhale TWO puffs into THE lungs every SIX hours as needed FOR wheezing OR shortness of breath 01/25/21  Yes Wert, Margreta Journey, NP  diltiazem (CARDIZEM CD) 120 MG 24 hr capsule Take 1 capsule (120 mg total) by mouth 2 (two) times daily. 11/27/20  Yes Deboraha Sprang, MD  dofetilide (TIKOSYN) 500 MCG capsule Take 1 capsule (500 mcg total) by mouth 2 (two) times daily. Please keep upcoming appt in May 2022 with Dr. Caryl Comes before anymore refills. Thank you 10/30/20  Yes Camnitz, Ocie Doyne, MD  ferrous sulfate 325 (65 FE) MG tablet Take 1 tablet (325 mg total) by mouth daily with  breakfast. 04/25/15  Yes Reyne Dumas, MD  FIBER PO Take by mouth.   Yes [provider]  furosemide (LASIX) 20 MG tablet TAKE ONE TABLET BY MOUTH ONCE DAILY 11/27/20  Yes Deboraha Sprang, MD  isosorbide mononitrate (IMDUR) 60 MG 24 hr tablet Take 1 tablet (60 mg total) by mouth daily. 10/01/20  Yes Deboraha Sprang, MD  Magnesium Aspartate 65 MG TABS Take 1 tablet by mouth daily.   Yes [provider]  metoprolol tartrate (LOPRESSOR) 25 MG tablet Take 1 tablet (25 mg total) by mouth 2 (two) times daily. 09/17/20  Yes Deboraha Sprang, MD  metroNIDAZOLE (METROGEL) 0.75 % gel Apply 1 application topically daily as needed (for rosacea).    Yes [provider]  Multiple Vitamins-Minerals (EYE VITAMINS) CAPS Take 1 capsule by mouth 2 (two) times daily.   Yes [provider]  Multiple Vitamins-Minerals (MULTIVITAMIN PO) Take 1 tablet by mouth 2 (two) times daily.   Yes  [provider]  pantoprazole (PROTONIX) 40 MG tablet Take 1 tablet (40 mg total) by mouth daily. 08/12/20  Yes Pyrtle, Lajuan Lines, MD  Probiotic Product (ALIGN PO) Take by mouth.   Yes [provider]  rosuvastatin (CRESTOR) 10 MG tablet Take 1 tablet (10 mg total) by mouth daily. 01/15/21  Yes Royal Hawthorn, NP  apixaban (ELIQUIS) 5 MG TABS tablet Take 1 tablet (5 mg total) by mouth 2 (two) times daily. 08/12/20   Sherran Needs, NP  nitroGLYCERIN (NITROSTAT) 0.4 MG SL tablet PLACE 1 TABLET UNDER THE TONGUE EVERY 5 MINUTES AS NEEDED FOR CHEST PAIN, MAY REPEAT FOR 3 DOSES. 08/11/20   Deboraha Sprang, MD    Current Outpatient Medications  Medication Sig Dispense Refill   albuterol (VENTOLIN HFA) 108 (90 Base) MCG/ACT inhaler Inhale TWO puffs into THE lungs every SIX hours as needed FOR wheezing OR shortness of breath 8.5 g 0   diltiazem (CARDIZEM CD) 120 MG 24 hr capsule Take 1 capsule (120 mg total) by mouth 2 (two) times daily. 180 capsule 3   dofetilide (TIKOSYN) 500 MCG capsule Take 1  capsule (500 mcg total) by mouth 2 (two) times daily. Please keep upcoming appt in May 2022 with Dr. Caryl Comes before anymore refills. Thank you 60 capsule 11   ferrous sulfate 325 (65 FE) MG tablet Take 1 tablet (325 mg total) by mouth daily with breakfast. 60 tablet 3   FIBER PO Take by mouth.     furosemide (LASIX) 20 MG tablet TAKE ONE TABLET BY MOUTH ONCE DAILY 90 tablet 3   isosorbide mononitrate (IMDUR) 60 MG 24 hr tablet Take 1 tablet (60 mg total) by mouth daily. 90 tablet 3   Magnesium Aspartate 65 MG TABS Take 1 tablet by mouth daily.     metoprolol tartrate (LOPRESSOR) 25 MG tablet Take 1 tablet (25 mg total) by mouth 2 (two) times daily. 60 tablet 5   metroNIDAZOLE (METROGEL) 0.75 % gel Apply 1 application topically daily as needed (for rosacea).      Multiple Vitamins-Minerals (EYE VITAMINS) CAPS Take 1 capsule by mouth 2 (two) times daily.     Multiple Vitamins-Minerals (MULTIVITAMIN PO) Take 1 tablet by mouth 2 (two) times daily.     pantoprazole (PROTONIX) 40 MG tablet Take 1 tablet (40 mg total) by mouth daily. 90 tablet 1   Probiotic Product (ALIGN PO) Take by mouth.     rosuvastatin (CRESTOR) 10 MG tablet Take 1 tablet (10 mg total) by mouth daily. 30 tablet 3   apixaban (ELIQUIS) 5 MG TABS tablet Take 1 tablet (5 mg total) by mouth 2 (two) times daily. 60 tablet 6   nitroGLYCERIN (NITROSTAT) 0.4 MG SL tablet PLACE 1 TABLET UNDER THE TONGUE EVERY 5 MINUTES AS NEEDED FOR CHEST PAIN, MAY REPEAT FOR 3 DOSES. 12 tablet 0   Current Facility-Administered Medications  Medication Dose Route Frequency Provider Last Rate Last Admin   0.9 %  sodium chloride infusion  500 mL Intravenous Once Terriona Horlacher V, DO        Allergies as of 02/03/2021   (No Known Allergies)    Family History  Problem Relation Age of Onset   Cancer Mother        ovarian   Other Father        renal disease- grief over loss of spouse   Cancer Brother        prostate- died of hematologic disorder 2nd to  chemo   Pulmonary  fibrosis Brother    Diabetes Neg Hx    Coronary artery disease Neg Hx    Colon cancer Neg Hx    Stomach cancer Neg Hx    Rectal cancer Neg Hx     Social History   Socioeconomic History   Marital status: Married    Spouse name: Not on file   Number of children: 3   Years of education: 16   Highest education level: Not on file  Occupational History   Occupation: Land    Employer: RETIRED  Tobacco Use   Smoking status: Former    Packs/day: 0.00    Years: 40.00    Pack years: 0.00    Types: Cigarettes, Cigars    Quit date: 09/18/2008    Years since quitting: 12.3   Smokeless tobacco: Former    Types: Chew   Tobacco comments:    Has Occasional Cigar/ quit   Vaping Use   Vaping Use: Never used  Substance and Sexual Activity   Alcohol use: Yes    Alcohol/week: 28.0 standard drinks    Types: 21 Glasses of wine, 7 Shots of liquor per week    Comment: states he does want to drink less and cut back on the amount.    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 married 1961. 2 sons- '64, '63 , I daughter- '66, 4 grandchildren. work: Museum/gallery curator, retired but still consults. 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      No new stressors/       Consumption of caffeine-Coffee      Exercise- 30 mins swimming daily   Social Determinants of Health   Financial Resource Strain: Low Risk    Difficulty of Paying Living Expenses: Not hard at all  Food Insecurity: No Food Insecurity   Worried About Charity fundraiser in the Last Year: Never true   Ran Out of Food in the Last Year: Never true  Transportation Needs: No Transportation Needs   Lack of Transportation (Medical): No   Lack of Transportation (Non-Medical): No  Physical Activity: Sufficiently Active    Days of Exercise per Week: 5 days   Minutes of Exercise per Session: 30 min  Stress: No Stress Concern Present   Feeling of Stress : Not at all  Social Connections: Socially Integrated   Frequency of Communication with Friends and Family: More than three times a week   Frequency of Social Gatherings with Friends and Family: More than three times a week   Attends Religious Services: More than 4 times per year   Active Member of [employer] or Organizations: Yes   Attends Music therapist: More than 4 times per year   Marital Status: Married  Human resources officer Violence: Not on file    Physical Exam: Vital signs in last 24 hours: @BP  (!) 101/49   Pulse 60   Temp (!) 97.1 F (36.2 C) (Temporal)   Ht 5\' 9"  (1.753 m)   Wt 220 lb (99.8 kg)   SpO2 98%   BMI 32.49 kg/m  GEN: NAD EYE: Sclerae anicteric ENT: MMM CV: Non-tachycardic Pulm: CTA b/l GI: Soft, NT/ND NEURO:  Alert & Oriented x 3   Gerrit Heck, DO [facility]   02/03/2021 10:24 AM

## 2021-02-03 NOTE — Progress Notes (Signed)
Called to room to assist during endoscopic procedure.  Patient ID and intended procedure confirmed with present staff. Received instructions for my participation in the procedure from the performing physician.  

## 2021-02-03 NOTE — Progress Notes (Signed)
A and O x3. Report to RN. Tolerated MAC anesthesia well.Teeth unchanged after procedure. 

## 2021-02-03 NOTE — Patient Instructions (Signed)
Please read handouts provided. Continue present medications. Resume Eliquis in two days. Increase Protonix ( pantoprazole ) to 40 mg twice daily for 6 weeks, then reduce back to 40 mg daily. Return to GI clinic in 2-4 weeks. Await pathology results.   YOU HAD AN ENDOSCOPIC PROCEDURE TODAY AT [facility]:   Refer to the procedure report that was given to you for any specific questions about what was found during the examination.  If the procedure report does not answer your questions, please call your gastroenterologist to clarify.  If you requested that your care partner not be given the details of your procedure findings, then the procedure report has been included in a sealed envelope for you to review at your convenience later.  YOU SHOULD EXPECT: Some feelings of bloating in the abdomen. Passage of more gas than usual.  Walking can help get rid of the air that was put into your GI tract during the procedure and reduce the bloating. If you had a lower endoscopy (such as a colonoscopy or flexible sigmoidoscopy) you may notice spotting of blood in your stool or on the toilet paper. If you underwent a bowel prep for your procedure, you may not have a normal bowel movement for a few days.  Please Note:  You might notice some irritation and congestion in your nose or some drainage.  This is from the oxygen used during your procedure.  There is no need for concern and it should clear up in a day or so.  SYMPTOMS TO REPORT IMMEDIATELY:  Following lower endoscopy (colonoscopy or flexible sigmoidoscopy):  Excessive amounts of blood in the stool  Significant tenderness or worsening of abdominal pains  Swelling of the abdomen that is new, acute  Fever of 100F or higher  Following upper endoscopy (EGD)  Vomiting of blood or coffee ground material  New chest pain or pain under the shoulder blades  Painful or persistently difficult swallowing  New shortness of breath  Fever of 100F  or higher  Black, tarry-looking stools  For urgent or emergent issues, a gastroenterologist can be reached at any hour by calling [phone]. Do not use MyChart messaging for urgent concerns.    DIET:  We do recommend a small meal at first, but then you may proceed to your regular diet.  Drink plenty of fluids but you should avoid alcoholic beverages for 24 hours.  ACTIVITY:  You should plan to take it easy for the rest of today and you should NOT DRIVE or use heavy machinery until tomorrow (because of the sedation medicines used during the test).    FOLLOW UP: Our staff will call the number listed on your records 48-72 hours following your procedure to check on you and address any questions or concerns that you may have regarding the information given to you following your procedure. If we do not reach you, we will leave a message.  We will attempt to reach you two times.  During this call, we will ask if you have developed any symptoms of COVID 19. If you develop any symptoms (ie: fever, flu-like symptoms, shortness of breath, cough etc.) before then, please call [phone].  If you test positive for Covid 19 in the 2 weeks post procedure, please call and report this information to [place].    If any biopsies were taken you will be contacted by phone or by letter within the next 1-3 weeks.  Please call us at [phone] if you have  not heard about the biopsies in 3 weeks.    SIGNATURES/CONFIDENTIALITY: You and/or your care partner have signed paperwork which will be entered into your electronic medical record.  These signatures attest to the fact that that the information above on your After Visit Summary has been reviewed and is understood.  Full responsibility of the confidentiality of this discharge information lies with you and/or your care-partner.

## 2021-02-03 NOTE — Op Note (Signed)
[facility] Patient Name: Donald [name] Procedure Date: 02/03/2021 10:24 AM MRN: [id number] Endoscopist: Gerrit Heck , MD Age: 87 Referring MD:  Date of Birth: [birth year] Gender: Male Account #: [id number] Procedure:                Upper GI endoscopy Indications:              Iron deficiency anemia, Hematochezia, Heme positive                            stool Medicines:                Monitored Anesthesia Care Procedure:                Pre-Anesthesia Assessment:                           - Prior to the procedure, a History and Physical                            was performed, and patient medications and                            allergies were reviewed. The patient's tolerance of                            previous anesthesia was also reviewed. The risks                            and benefits of the procedure and the sedation                            options and risks were discussed with the patient.                            All questions were answered, and informed consent                            was obtained. Prior Anticoagulants: The patient has                            taken Eliquis (apixaban), last dose was 2 days                            prior to procedure. ASA Grade Assessment: III - A                            patient with severe systemic disease. After                            reviewing the risks and benefits, the patient was                            deemed in satisfactory condition to undergo the  procedure.                           After obtaining informed consent, the endoscope was                            passed under direct vision. Throughout the                            procedure, the patient's blood pressure, pulse, and                            oxygen saturations were monitored continuously. The                            GIF [id number] was introduced through the                            mouth, and  advanced to the third part of duodenum.                            The upper GI endoscopy was accomplished without                            difficulty. The patient tolerated the procedure                            well. Scope In: Scope Out: Findings:                 A mild Schatzki ring was found in the lower third                            of the esophagus. As the patient was without                            complaint of dysphagia, elected to fracture the                            ring with cold forceps rather than TTS balloon                            dilation. Estimated blood loss was minimal.                           The upper third of the esophagus and middle third                            of the esophagus were normal.                           A 3 cm hiatal hernia was present.                           Localized mild inflammation characterized by  congestion (edema) and erythema was found in the                            gastric antrum. Biopsies were taken with a cold                            forceps for histology. Estimated blood loss was                            minimal.                           A single 3 mm angioectasia with no bleeding was                            found in the gastric body. Coagulation for                            hemostasis using snare tip was successful.                            Estimated blood loss: none.                           The duodenal bulb and third portion of the duodenum                            were normal.                           A single 4 mm angioectasia without bleeding was                            found in the second portion of the duodenum.                            Coagulation for hemostasis using snare tip was                            successful. There was mild persistent oozing after                            thermal therapy. For hemostasis, one hemostatic                             clip was successfully placed. There was no bleeding                            at the end of the procedure. Complications:            No immediate complications. Estimated Blood Loss:     Estimated blood loss was minimal. Impression:               - Mild Schatzki ring. Biopsied.                           -  Normal upper third of esophagus and middle third                            of esophagus.                           - 3 cm hiatal hernia.                           - Gastritis. Biopsied.                           - A single non-bleeding angioectasia in the                            stomach. Treated with a hot snare.                           - Normal duodenal bulb and third portion of the                            duodenum.                           - A single non-bleeding angioectasia in the                            duodenum. Treated with a hot snare. Clip was placed. Recommendation:           - Patient has a contact number available for                            emergencies. The signs and symptoms of potential                            delayed complications were discussed with the                            patient. Return to normal activities tomorrow.                            Written discharge instructions were provided to the                            patient.                           - Resume previous diet today.                           - Increase Protonix (pantoprazole) to 40 mg PO BID                            for 6 weeks to promote mucosal healing, then reduce  back to 40 mg daily for continued control of reflux.                           - Perform a colonoscopy today. Additional                            recommendations and plan pending colonoscopy                            findings. Gerrit Heck, MD 02/03/2021 11:50:43 AM

## 2021-02-03 NOTE — Progress Notes (Signed)
Vitals by DT

## 2021-02-04 ENCOUNTER — Telehealth: Payer: Self-pay

## 2021-02-04 DIAGNOSIS — K6389 Other specified diseases of intestine: Secondary | ICD-10-CM

## 2021-02-04 LAB — CEA: CEA: 1.5 ng/mL

## 2021-02-04 NOTE — Telephone Encounter (Signed)
Per 02/03/21 procedure report: - Refer to a colo-rectal surgeon at appt to be scheduled - Perform CT scan of the abdomen and pelvis with contrast - Proceed with CT chest high resolution as already ordered by Pulmonary clinic - Plan for referral to the oncology clinic pending pathology results - Check CEA level - pt had labs drawn on 9/21 - Return to GI clinic in 2-4 weeks  - Referral, records, demographic, and insurance information faxed to [facility]. - CT scan order in epic. Secure staff message sent to radiology schedulers to contact patient to set up appointment. - Patient has been scheduled for a follow up with Dr. Bryan Lemma on Tuesday, 02/16/21 at 11 am.  Patient notified of recommendations via my chart message.

## 2021-02-04 NOTE — Telephone Encounter (Signed)
Message received from Patient today. Patient last HRCT 05/06/20.   Message from Patient- Dr Bryan Lemma at [facility] has ordered a CT scan of abdomen for me at the next available appointment. Is it possible to have the chest high resolution you want accomplished at the same time?

## 2021-02-05 ENCOUNTER — Telehealth: Payer: Self-pay | Admitting: Gastroenterology

## 2021-02-05 ENCOUNTER — Telehealth: Payer: Self-pay | Admitting: *Deleted

## 2021-02-05 NOTE — Telephone Encounter (Signed)
I spoke with the patient over the phone today to discuss the biopsy results.  The cecal mass that was biopsied returned as adenocarcinoma.  The additional polyps that were resected from the colon were a combination of tubular adenomas, sessile serrated polyps, and traditional serrated adenoma.  Among the polyps resected from the transverse colon/descending colon was a small focus of adenocarcinoma.  I discussed with the Pathologist and favor this to represent a fragment of the colon mass that was inadvertently suctioned and submitted with the polyps.  He has an appointment scheduled in the Colorectal Surgery clinic on Monday, 02/08/2021.  I will also reach out to Dr. Benay Spice at the [facility] now that we have path confirmation.  We discussed these findings at length by phone.  All questions answered to the best of my ability.  He was very appreciative for the phone call.

## 2021-02-05 NOTE — Telephone Encounter (Signed)
  Follow up Call-  Call back number 02/03/2021  Post procedure Call Back phone  # [phone]  Permission to leave phone message Yes  Some recent data might be hidden     Patient questions:  Do you have a fever, pain , or abdominal swelling? No. Pain Score  0 *  Have you tolerated food without any problems? Yes.    Have you been able to return to your normal activities? Yes.    Do you have any questions about your discharge instructions: Diet   No. Medications  No. Follow up visit  No.  Do you have questions or concerns about your Care? No.  Actions: * If pain score is 4 or above: No action needed, pain <4.Have you developed a fever since your procedure? no  2.   Have you had an respiratory symptoms (SOB or cough) since your procedure? no  3.   Have you tested positive for COVID 19 since your procedure no  4.   Have you had any family members/close contacts diagnosed with the COVID 19 since your procedure?  no   If yes to any of these questions please route to Joylene John, RN and Joella Prince, RN

## 2021-02-07 DIAGNOSIS — G4733 Obstructive sleep apnea (adult) (pediatric): Secondary | ICD-10-CM | POA: Diagnosis not present

## 2021-02-08 ENCOUNTER — Other Ambulatory Visit: Payer: Self-pay

## 2021-02-08 ENCOUNTER — Ambulatory Visit: Payer: Self-pay | Admitting: General Surgery

## 2021-02-08 ENCOUNTER — Encounter: Payer: Self-pay | Admitting: Adult Health

## 2021-02-08 ENCOUNTER — Encounter: Payer: Self-pay | Admitting: Internal Medicine

## 2021-02-08 ENCOUNTER — Non-Acute Institutional Stay: Payer: PPO | Admitting: Adult Health

## 2021-02-08 ENCOUNTER — Telehealth: Payer: Self-pay | Admitting: *Deleted

## 2021-02-08 ENCOUNTER — Telehealth: Payer: Self-pay

## 2021-02-08 VITALS — BP 116/64 | HR 60 | Temp 97.8°F | Ht 69.0 in | Wt 221.0 lb

## 2021-02-08 DIAGNOSIS — E669 Obesity, unspecified: Secondary | ICD-10-CM | POA: Diagnosis not present

## 2021-02-08 DIAGNOSIS — K6389 Other specified diseases of intestine: Secondary | ICD-10-CM | POA: Diagnosis not present

## 2021-02-08 DIAGNOSIS — I5032 Chronic diastolic (congestive) heart failure: Secondary | ICD-10-CM

## 2021-02-08 DIAGNOSIS — J849 Interstitial pulmonary disease, unspecified: Secondary | ICD-10-CM

## 2021-02-08 DIAGNOSIS — D500 Iron deficiency anemia secondary to blood loss (chronic): Secondary | ICD-10-CM | POA: Diagnosis not present

## 2021-02-08 DIAGNOSIS — C182 Malignant neoplasm of ascending colon: Secondary | ICD-10-CM | POA: Diagnosis not present

## 2021-02-08 DIAGNOSIS — G4733 Obstructive sleep apnea (adult) (pediatric): Secondary | ICD-10-CM

## 2021-02-08 DIAGNOSIS — C801 Malignant (primary) neoplasm, unspecified: Secondary | ICD-10-CM | POA: Diagnosis not present

## 2021-02-08 DIAGNOSIS — I10 Essential (primary) hypertension: Secondary | ICD-10-CM | POA: Diagnosis not present

## 2021-02-08 DIAGNOSIS — I480 Paroxysmal atrial fibrillation: Secondary | ICD-10-CM

## 2021-02-08 NOTE — H&P (Signed)
REFERRING PHYSICIAN:  Gerrit Heck, DO  PROVIDER:  Monico Blitz, MD  MRN: [id number] DOB: 1932/11/11 DATE OF ENCOUNTER: 02/08/2021  Subjective   Chief Complaint: colon mass     History of Present Illness: Donald [name] is a 87 y.o. male who is seen today as an office consultation at the request of Dr. Bryan Lemma for evaluation of colon mass  87 year old male with a history of cardiomyopathy, coronary artery disease and recent anemia that was diagnosed due to shortness of breath and fatigue.  This was treated and then evaluated with colonoscopy and EGD.  This showed a cecal mass with biopsies showing adenocarcinoma.  Patient has a history of pacemaker placement and is on Eliquis due to atrial fibrillation.  He denies any past abdominal surgical history.  Patient reports recent history of diarrhea.  He denies any weight loss.  Review of Systems: A complete review of systems was obtained from the patient.  I have reviewed this information and discussed as appropriate with the patient.  See HPI as well for other ROS.    Medical History: Past Medical History:  Diagnosis Date   Anemia    COPD (chronic obstructive pulmonary disease) (CMS-HCC)    GERD (gastroesophageal reflux disease)    History of cancer    Sleep apnea     Patient Active Problem List  Diagnosis   Allergic rhinitis due to pollen   Angiodysplasia of upper gastrointestinal tract   Coronary artery disease involving native coronary artery of native heart without angina pectoris   Atrial flutter (CMS-HCC)   Blood in stool   CHF (congestive heart failure) (CMS-HCC)   Chronic anticoagulation   COVID   Emphysema lung (CMS-HCC)   Esophageal stricture   Essential hypertension   Habitual alcohol use   Hearing loss   Hyperlipidemia with target LDL less than 100   ILD (interstitial lung disease) (CMS-HCC)   Iron deficiency anemia   Obesity (BMI 30.0-34.9), unspecified   OSA (obstructive sleep apnea)    Pacemaker   PAF (paroxysmal atrial fibrillation) (CMS-HCC)   Psychosexual dysfunction with inhibited sexual excitement   Renal cyst, left   Routine health maintenance   Tachycardia    Past Surgical History:  Procedure Laterality Date   HERNIA REPAIR     TONSILLECTOMY N/A      No Known Allergies  Current Outpatient Medications on File Prior to Visit  Medication Sig Dispense Refill   apixaban (ELIQUIS) 5 mg tablet Take 5 mg by mouth 2 (two) times daily     diltiazem (CARDIZEM CD) 120 MG XR capsule Take by mouth     dofetilide (TIKOSYN) 500 MCG capsule Take by mouth     ferrous sulfate 325 (65 FE) MG tablet Take 325 mg by mouth daily with breakfast     FUROsemide (LASIX) 20 MG tablet Take 1 tablet by mouth once daily     isosorbide mononitrate (IMDUR) 60 MG ER tablet Take by mouth     metoprolol tartrate (LOPRESSOR) 25 MG tablet Take 25 mg by mouth 2 (two) times daily     nitroGLYcerin (NITROSTAT) 0.4 MG SL tablet PLACE 1 TABLET UNDER THE TONGUE EVERY 5 MINUTES AS NEEDED FOR CHEST PAIN, MAY REPEAT FOR 3 DOSES.     rosuvastatin (CRESTOR) 10 MG tablet Take 1 tablet by mouth once daily     albuterol 90 mcg/actuation inhaler Inhale TWO puffs into THE lungs every SIX hours as needed FOR wheezing OR shortness of breath     Bifidobacterium infantis (  ALIGN ORAL) Take by mouth     dextrin (FIBER POWDER ORAL) Take by mouth     magnesium chloride (MAG DELAY) 64 mg DR tablet Take 1 tablet by mouth once daily     metroNIDAZOLE (METROGEL) 0.75 % gel Apply topically     multivitamin with iron/minerals (THERADEX-M) 27-0.4 mg tablet Take 1 tablet by mouth 2 (two) times daily     multivitamin-minerals (OCUVITE) tablet Take 1 capsule by mouth 2 (two) times daily     pantoprazole (PROTONIX) 40 MG DR tablet TAKE ONE TABLET BY MOUTH TWICE DAILY FOR SIX WEEKS, THEN reduce back TO ONCE DAILY     No current facility-administered medications on file prior to visit.    History reviewed. No pertinent family  history.   Social History   Tobacco Use  Smoking Status Former Smoker  Smokeless Tobacco Never Used     Social History   Socioeconomic History   Marital status: Married  Tobacco Use   Smoking status: Former Smoker   Smokeless tobacco: Never Used  Scientific laboratory technician Use: Never used  Substance and Sexual Activity   Alcohol use: Yes   Drug use: Never    Objective:    Vitals:   02/08/21 0957  BP: 124/64  Pulse: 75  Temp: 36.7 C (98 F)  SpO2: 98%  Weight: 99.8 kg (220 lb)  Height: 175.3 cm (5\' 9" )     Exam Gen: NAD CV: irregular rhythm  Lungs:CTA Abd: soft, nontender    Labs, Imaging and Diagnostic Testing: CEA: 1.5  CT CAP: pending  Assessment and Plan:  Diagnoses and all orders for this visit:  Malignant neoplasm of ascending colon (CMS-HCC)  Mass of colon -     polyethylene glycol (MIRALAX) powder; Take 233.75 g by mouth once for 1 dose Take according to your procedure prep instructions. -     bisacodyL (DULCOLAX) 5 mg EC tablet; Take 4 tablets (20 mg total) by mouth once for 1 dose -     metroNIDAZOLE (FLAGYL) 500 MG tablet; Take 2 tablets (1,000 mg total) by mouth 3 (three) times daily for 3 doses Take according to your procedure colon prep instructions -     neomycin 500 mg tablet; Take 2 tablets (1,000 mg total) by mouth 3 (three) times daily for 3 doses Take according to your procedure colon prep instructions    Biopsies show adenocarcinoma.  CEA levels are normal.  Patient will undergo CT scans next week.  We will follow-up on these and discuss surgical cardiac risk stratification with his cardiologist.  Once this information is returned, we will most likely proceed with a robotic assisted partial colectomy.  We have discussed this in detail including perioperative risk and postoperative recovery times.  All questions were answered.  The surgery and anatomy were described to the patient as well as the risks of surgery and the possible  complications.  These include: Bleeding, deep abdominal infections and possible wound complications such as hernia and infection, damage to adjacent structures, leak of surgical connections, which can lead to other surgeries and possibly an ostomy, possible need for other procedures, such as abscess drains in radiology, possible prolonged hospital stay, possible diarrhea from removal of part of the colon, possible constipation from narcotics, possible bowel, bladder or sexual dysfunction if having rectal surgery, prolonged fatigue/weakness or appetite loss, possible early recurrence of of disease, possible complications of their medical problems such as heart disease or arrhythmias or lung problems, death (less than  1%). I believe the patient understands and wishes to proceed with the surgery.

## 2021-02-08 NOTE — Telephone Encounter (Signed)
Spoke with patient in regards to information below. Pt wanted to cancel follow up with Dr. Bryan Lemma. Pt states that he saw Dr. Marcello Moores this morning and she will review his pathology and imaging results with him once they are available. Pt states that Dr. Marcello Moores also agreed to hold off on oncology referral until they see how surgery goes. Pt had no concerns at the end of the call.

## 2021-02-08 NOTE — Telephone Encounter (Signed)
   [facility] Pre-operative Risk Assessment    Patient Name: Donald [name]  DOB: 08-Jan-1933 MRN: [id number]  HEARTCARE STAFF:  - IMPORTANT!!!!!! Under Visit Info/Reason for Call, type in Other and utilize the format Clearance MM/DD/YY or Clearance TBD. Do not use dashes or single digits. - Please review there is not already an duplicate clearance open for this procedure. - If request is for dental extraction, please clarify the # of teeth to be extracted. - If the patient is currently at the dentist's office, call Pre-Op Callback Staff (MA/nurse) to input urgent request.  - If the patient is not currently in the dentist office, please route to the Pre-Op pool.  Request for surgical clearance:  What type of surgery is being performed? PARTIAL COLECTOMY  When is this surgery scheduled? TBD  What type of clearance is required (medical clearance vs. Pharmacy clearance to hold med vs. Both)? BOTH; ALSO WILL NEED DEVICE CLEARANCE AS WELL  Are there any medications that need to be held prior to surgery and how long? [place] name and name of physician performing surgery? [facility]; DR. Leighton Ruff  What is the office phone number? [phone]   7.   What is the office fax number? [place]: Mammie Lorenzo, LPN  8.   Anesthesia type (None, local, MAC, general) ? GENERAL   Julaine Hua 02/08/2021, 11:58 AM  _________________________________________________________________   (provider comments below)

## 2021-02-08 NOTE — Progress Notes (Unsigned)
[place] DEVICE PROGRAMMING   Patient Information:  Patient: Donald [name]  MRN: [id number]  Date of Birth: February 19, 1933      Planned Procedure:  PARTIAL COLECTOMY  Surgeon:  [facility]; DR. Leighton Ruff Date of Procedure:  TBD    Device Information:   Clinic EP Physician:   Dr. Virl Axe Device Type:  Pacemaker Manufacturer and Phone #:  [employer]: [phone] Pacemaker Dependent?:  Yes Date of Last Device Check:  11/17/20 (Remote)        Normal Device Function?:  Yes     Electrophysiologist's Recommendations:   Have magnet available. Provide continuous ECG monitoring when magnet is used or reprogramming is to be performed.  Procedure may interfere with device function.  Magnet should be placed over device during procedure.  Per Device Clinic Standing Orders, Simone Curia  02/08/2021 3:17 PM

## 2021-02-08 NOTE — Progress Notes (Addendum)
Location:  [facility]  POS:  clinic  Provider:  Cindi Carbon, ANP [facility] [phone]    Goals of Care:  Advanced Directives 01/21/2021  Does Patient Have a Medical Advance Directive? Yes  Type of Advance Directive -  Does patient want to make changes to medical advance directive? No - Patient declined  Copy of [place] in Chart? -  Would patient like information on creating a medical advance directive? -  Pre-existing out of facility DNR order (yellow form or pink MOST form) -     Chief Complaint  Patient presents with   Medical Management of Chronic Issues     HPI: Patient is a 87 y.o. male seen today for medical management of chronic diseases.  PMH significant for aflutter s/p ablation, cardiomyopathy, CHF, gastritis, anemia, COPD, OSA, pulmonary fibrosis, AVM, pacemaker, GERD, GIB, HLD, HTN, afib.  9/21 Colonoscopy and upper endo due to anemia with heme pos stools.  Cecal mass confirmed to be adenocarcinoma, polyps, hiatal hernia found. CEA 1.5. CT of the abd ordered and referral made to Dr. Benay Spice.  He has received a blood transfusion from Dr. Hazeline Junker office. Last Hgb 7.3 on 01/19/21.F/U CBC not in epic.  He saw the surgeon today with a recommendation for partial colectomy. He is seeing cardiology for clearance.   He feel his leg and hand cramps are better with taking pedialyte and with the reduction of crestor.    His symptoms of sob with exertion have also improved but he continues to have decreased energy.   He has a hx of copd, smoking and pulmonary fibrosis and followed by Dr. Vaughan Browner with monitoring (CT's and PFTs).    Past Medical History:  Diagnosis Date   Abnormal CT scan, stomach 02/2014   Thickening of gastric fundus and cardia.  gastritis on EGD 03/2014   Allergic rhinitis due to pollen    Arthropathy, unspecified, site unspecified    Atherosclerosis    a. Noted by abdominal CT 02/2014 (h/o normal nuc 2011).    Atrial flutter ([place]) 02/24/2014   s/p ablation 11/15   Cardiomyopathy ([place])    tachy mediated - a. TEE (10/15):  EF 30%;  b. Echo after NSR restored (10/15):  mild LVH, EF 55-60%, mild AS, mild AI, mild MR, mild to mod LAE, mild RAE   CHF (congestive heart failure) ([place]) 01/04/2021   COVID-19 11/2020   Diverticulosis    severe in descending, sigmoid colon.    ED (erectile dysfunction)    Emphysema of lung ([place])    Esophageal stricture 03/2014   traversable with endoscope. not dilated.    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   Skin cancer    basal and squamous cell   Sleep apnea     Past Surgical History:  Procedure Laterality Date   ATRIAL FLUTTER ABLATION N/A 03/19/2014   RFCA of atrial flutter by Dr Caryl Comes   CARDIOVERSION N/A 02/24/2014   Procedure: CARDIOVERSION;  Surgeon: Lelon Perla, MD;  Location:  [facility] ENDOSCOPY;  Service: Cardiovascular;  Laterality: N/A;   CARDIOVERSION Right 09/10/2014   Procedure: CARDIOVERSION;  Surgeon: Deboraha Sprang, MD;  Location: [facility] CATH LAB;  Service: Cardiovascular;  Laterality: Right;   CARDIOVERSION N/A 01/22/2018   Procedure: CARDIOVERSION;  Surgeon: Fay Records, MD;  Location: [facility] ENDOSCOPY;  Service: Cardiovascular;  Laterality: N/A;   COLONOSCOPY N/A 04/23/2015   Procedure: COLONOSCOPY;  Surgeon: Ladene Artist, MD;  Location: [facility] ENDOSCOPY;  Service: Endoscopy;  Laterality: N/A;   COLONOSCOPY     ENTEROSCOPY N/A 06/12/2015   Procedure: ENTEROSCOPY;  Surgeon: Ladene Artist, MD;  Location: [facility] ENDOSCOPY;  Service: Endoscopy;  Laterality: N/A;   EP IMPLANTABLE DEVICE N/A 02/15/2016   Procedure:  Pacemaker Implant;  Surgeon: Evans Lance, MD;  Location: [place] CV LAB;  Service: Cardiovascular;  Laterality: N/A;   ESOPHAGOGASTRODUODENOSCOPY N/A 04/09/2014   Procedure: ESOPHAGOGASTRODUODENOSCOPY (EGD);  Surgeon: Inda Castle, MD;  Location: [place];  Service: Endoscopy;  Laterality: N/A;   ESOPHAGOGASTRODUODENOSCOPY N/A 04/23/2015   Procedure: ESOPHAGOGASTRODUODENOSCOPY (EGD);  Surgeon: Ladene Artist, MD;  Location: [facility] ENDOSCOPY;  Service: Endoscopy;  Laterality: N/A;   INGUINAL HERNIA REPAIR     right   INGUINAL HERNIA REPAIR     left   LEFT HEART CATH AND CORONARY ANGIOGRAPHY N/A 03/16/2017   Procedure: LEFT HEART CATH AND CORONARY ANGIOGRAPHY;  Surgeon: Burnell Blanks, MD;  Location: [place] CV LAB;  Service: Cardiovascular;  Laterality: N/A;   ORIF fracture of the elbow     SKIN CANCER EXCISION  05/21/2012   Squamous cell ca   TEE WITHOUT CARDIOVERSION N/A 02/24/2014   Procedure: TRANSESOPHAGEAL ECHOCARDIOGRAM (TEE);  Surgeon: Lelon Perla, MD;  Location: [facility] ENDOSCOPY;  Service: Cardiovascular;  Laterality: N/A;   TONSILLECTOMY     UPPER GASTROINTESTINAL ENDOSCOPY      No Known Allergies  Outpatient Encounter Medications as of 02/08/2021  Medication Sig   albuterol (VENTOLIN HFA) 108 (90 Base) MCG/ACT inhaler Inhale TWO puffs into THE lungs every SIX hours as needed FOR wheezing OR shortness of breath   apixaban (ELIQUIS) 5 MG TABS tablet Take 1 tablet (5 mg total) by mouth 2 (two) times daily.   diltiazem (CARDIZEM CD) 120 MG 24 hr capsule Take 1 capsule (120 mg total) by mouth 2 (two) times daily.   dofetilide (TIKOSYN) 500 MCG capsule Take 1 capsule (500 mcg total) by mouth 2 (two) times daily. Please keep upcoming appt in May 2022 with Dr. Caryl Comes before anymore refills. Thank you   ferrous sulfate 325 (65 FE) MG tablet Take 1 tablet (325 mg total) by mouth daily with breakfast.   FIBER PO Take by mouth.   furosemide (LASIX) 20 MG tablet TAKE  ONE TABLET BY MOUTH ONCE DAILY   isosorbide mononitrate (IMDUR) 60 MG 24 hr tablet Take 1 tablet (60 mg total) by mouth daily.   Magnesium Aspartate 65 MG TABS Take 1 tablet by mouth daily.   metoprolol tartrate (LOPRESSOR) 25 MG tablet Take 1 tablet (25 mg total) by mouth 2 (two) times daily.   metroNIDAZOLE (METROGEL) 0.75 % gel Apply 1 application topically daily as needed (for rosacea).    Multiple Vitamins-Minerals (EYE VITAMINS) CAPS Take 1 capsule by mouth 2 (two) times daily.   Multiple Vitamins-Minerals (MULTIVITAMIN PO) Take 1 tablet by mouth 2 (two) times daily.   nitroGLYCERIN (NITROSTAT) 0.4 MG SL tablet PLACE 1 TABLET UNDER THE TONGUE EVERY 5 MINUTES AS NEEDED FOR CHEST PAIN, MAY  REPEAT FOR 3 DOSES.   pantoprazole (PROTONIX) 40 MG tablet Take 1 tablet (40 mg total) by mouth 2 (two) times daily. Protonix ( pantoprazole ) 40 mg twice daily for 6 weeks, then reduce back to 40 mg daily.   Probiotic Product (ALIGN PO) Take by mouth.   rosuvastatin (CRESTOR) 10 MG tablet Take 1 tablet (10 mg total) by mouth daily.   No facility-administered encounter medications on file as of 02/08/2021.    Review of Systems:  Review of Systems  Constitutional:  Positive for fatigue. Negative for activity change, appetite change, chills, diaphoresis, fever and unexpected weight change.  Respiratory:  Negative for cough, shortness of breath (on exertion improved.), wheezing and stridor.   Cardiovascular:  Positive for leg swelling. Negative for chest pain and palpitations.  Gastrointestinal:  Positive for blood in stool. Negative for abdominal distention, abdominal pain, constipation and diarrhea.  Genitourinary:  Negative for difficulty urinating and dysuria.  Musculoskeletal:  Negative for arthralgias, back pain, gait problem, joint swelling and myalgias.  Neurological:  Negative for dizziness, seizures, syncope, facial asymmetry, speech difficulty, weakness and headaches.  Hematological:  Negative  for adenopathy. Does not bruise/bleed easily.  Psychiatric/Behavioral:  Negative for agitation, behavioral problems and confusion.    Health Maintenance  Topic Date Due   INFLUENZA VACCINE  02/15/2021 (Originally 12/14/2020)   TETANUS/TDAP  04/16/2028   COVID-19 Vaccine  Completed   Zoster Vaccines- Shingrix  Completed   HPV VACCINES  Aged Out    Physical Exam: Vitals:   02/08/21 1555  BP: 116/64  Pulse: 60  Temp: 97.8 F (36.6 C)  SpO2: 98%  Weight: 221 lb (100.2 kg)  Height: 5\' 9"  (1.753 m)   Body mass index is 32.64 kg/m. Physical Exam Vitals and nursing note reviewed.  Constitutional:      General: He is not in acute distress.    Appearance: He is not diaphoretic.  HENT:     Head: Normocephalic and atraumatic.  Neck:     Thyroid: No thyromegaly.     Vascular: No JVD.     Trachea: No tracheal deviation.  Cardiovascular:     Rate and Rhythm: Normal rate and regular rhythm.     Heart sounds: No murmur heard. Pulmonary:     Effort: Pulmonary effort is normal. No respiratory distress.     Breath sounds: Normal breath sounds. No wheezing.  Abdominal:     General: Bowel sounds are normal. There is no distension.     Palpations: Abdomen is soft.     Tenderness: There is no abdominal tenderness.  Musculoskeletal:     Right lower leg: Edema (+1) present.     Left lower leg: Edema (+1) present.  Lymphadenopathy:     Cervical: No cervical adenopathy.  Skin:    General: Skin is warm and dry.  Neurological:     Mental Status: He is alert and oriented to person, place, and time.     Cranial Nerves: No cranial nerve deficit.    Labs reviewed: Basic Metabolic Panel: Recent Labs    03/05/20 0953 08/07/20 1250 01/07/21 0000 02/03/21 1255  NA 138 138 138  138  --   K 4.8 4.6 4.5  4.5  --   CL 104 105 103  103  --   CO2 28 26 23*  23*  --   GLUCOSE 95 93  --   --   BUN 22 18 19  19 12   CREATININE 1.03 0.98 1.0  1.0 0.91  CALCIUM 9.4 9.1 9.2  9.2  --   MG   --  1.9  --   --   TSH 0.65  --  0.63  --    Liver Function Tests: Recent Labs    03/05/20 0953 01/07/21 0000  AST 20 18  18   ALT 17 10  10   ALKPHOS 70 94  94  BILITOT 0.5  --   PROT 6.8  --   ALBUMIN 4.1 4.2  4.2   No results for input(s): LIPASE, AMYLASE in the last 8760 hours. No results for input(s): AMMONIA in the last 8760 hours. CBC: Recent Labs    02/24/20 1100 08/31/20 1040 01/07/21 0000 01/14/21 0000 01/19/21 1419  WBC 8.3 8.1 6.0 8.3  8.3 8.0  NEUTROABS 5.4 5.4  --   --  5.5  HGB 14.2 12.8* 8.1* 8.0* 7.3*  HCT 42.1 38.0* 25* 25* 24.5*  MCV 99.1 97.6  --   --  90.4  PLT 193.0 209.0 268 286 294   Lipid Panel: Recent Labs    03/05/20 0953 01/07/21 0000  CHOL 185 98  HDL 47.20 38  LDLCALC 124* 51  TRIG 71.0 50  CHOLHDL 4  --    No results found for: HGBA1C  Procedures since last visit: No results found.  Assessment/Plan  1. Iron deficiency anemia due to chronic blood loss S/p transfusion - CBC with Differential/Platelets; Future - IBC Panel([place]); Future - Ferritin; Future  2. 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 Vaughan Browner.  We discussed that he is at risk for complication due to his health hx but he also has a good quality of life that he would like to maintain. With his reasoning in mind he would like to pursue surgery.   3. PAF (paroxysmal atrial fibrillation) (HCC) Regular on exam Has pacemaker  Rate is controlled ON eliquis for CVA risk reduction, will need to be held prior to surgery   4. Essential hypertension Controlled  5. Chronic diastolic congestive heart failure (HCC) Compensated Continue Lasix 20 mg qd   6. ILD (interstitial lung disease) ([place]) On room air Has serial CTs and PFTs per Dr. Vaughan Browner   7. OSA (obstructive sleep apnea) CPAP qhs  8. Obesity (BMI 30.0-34.9) Recommend weight loss prior to surgery if  possible   Labs/tests ordered:  * No order type specified * CBC Ferritin Iron panel Next appt:  05/26/2021   Total time 85min  time greater than 50% of total time spent doing pt counseling and coordination of care

## 2021-02-08 NOTE — Telephone Encounter (Signed)
   Name: Donald [name]  DOB: [birth year]  MRN: [id number]  Primary Cardiologist: Virl Axe, MD  Chart reviewed as part of pre-operative protocol coverage. Because of Donald [name]'s past medical history and time since last visit, he will require a follow-up visit in order to better assess preoperative cardiovascular risk.  At last OV 09/2020, he was having issues with volume overload and chest discomfort. It was also unclear if his chest discomfort was ischemic or not. He had a prior abnormal stress test. He was treated with additional diuretics. His isosorbide was also increased. Recommend OV to ensure medically optimized from cardiac standpoint. This is OK to be scheduled with general cardiology APP.  Pre-op covering staff: - Please schedule appointment and call patient to inform them. If patient already had an upcoming appointment within acceptable timeframe, please add "pre-op clearance" to the appointment notes so provider is aware. - Please contact requesting surgeon's office via preferred method (i.e, phone, fax) to inform them of need for appointment prior to surgery.  This message will also be routed to pharmacy pool and/or primary cardiologist for input on holding anticoagulant/antiplatelet agent as requested below so that this information is available to the clearing provider at time of patient's appointment. Pharm team - no need to route back to preop pool.  Charlie Pitter, PA-C  02/08/2021, 3:07 PM

## 2021-02-08 NOTE — Telephone Encounter (Signed)
-----   Message from [place], DO sent at 02/08/2021  8:37 AM EDT ----- Would you mind reaching out to this patient to let him know that I talked to Dr. Benay Spice who recommends that this patient does not need a Oncology referral just yet, but rather can be seen in the Oncology clinic after evaluation in the Colorectal Surgery clinic and completion of surgical resection.  I tried calling him this morning, but I think he might be at his Colorectal Surgery appointment today.  Thank you.

## 2021-02-08 NOTE — Telephone Encounter (Signed)
I s/w the pt and he is agreeable to see Oda Kilts, PAC. I s/w Janan Halter, RN Clinic Supervisor, ok to have pt see EP APP. Pt opted for the appt on 02/12/21 @ 10:20. Pt is going to the beach and is very grateful for all of our help in this matter. I will forward clearance notes to [facility] for upcoming appt. Will send FYI to requesting office pt has appt 02/12/21.

## 2021-02-09 ENCOUNTER — Other Ambulatory Visit: Payer: Self-pay

## 2021-02-09 DIAGNOSIS — K6389 Other specified diseases of intestine: Secondary | ICD-10-CM

## 2021-02-09 NOTE — Telephone Encounter (Signed)
Patient with diagnosis of afib on Eliquis for anticoagulation.    Procedure: partial colectomy Date of procedure: TBD  CHA2DS2-VASc Score = 5  This indicates a 7.2% annual risk of stroke. The patient's score is based upon: CHF History: 1 HTN History: 1 Diabetes History: 0 Stroke History: 0 Vascular Disease History: 1 Age Score: 2 Gender Score: 0   CrCl 17mL/min using adjusted body weight Platelet count 294K  Per office protocol, patient can hold Eliquis for 2 days prior to procedure.

## 2021-02-10 ENCOUNTER — Encounter: Payer: Self-pay | Admitting: *Deleted

## 2021-02-10 NOTE — Progress Notes (Signed)
Spoke with patient after his visit with Dr Marcello Moores on 9/26. He has a CT scan on 10/3 to evaluate extent of disease, he has appts with Cardiology and Pulmonology for surgical clearance and then F/U with Dr Marcello Moores. Informed patient that we would be following and if surgery is warranted then we will see him 2 weeks post op unless earlier appt needed due to CT scan results. Verbalized understanding and appreciated call.

## 2021-02-11 NOTE — Telephone Encounter (Signed)
Ok to add CT high resolution to be done on same day at CT abdomen on 10/3.  I am seeing him on 10/5 and will discuss the results in detail

## 2021-02-12 ENCOUNTER — Encounter: Payer: Self-pay | Admitting: Student

## 2021-02-12 ENCOUNTER — Ambulatory Visit (INDEPENDENT_AMBULATORY_CARE_PROVIDER_SITE_OTHER): Payer: PPO | Admitting: Student

## 2021-02-12 ENCOUNTER — Other Ambulatory Visit: Payer: Self-pay

## 2021-02-12 ENCOUNTER — Other Ambulatory Visit: Payer: PPO

## 2021-02-12 ENCOUNTER — Other Ambulatory Visit: Payer: Self-pay | Admitting: Adult Health

## 2021-02-12 VITALS — BP 110/60 | HR 60 | Ht 69.0 in | Wt 217.0 lb

## 2021-02-12 DIAGNOSIS — I5032 Chronic diastolic (congestive) heart failure: Secondary | ICD-10-CM

## 2021-02-12 DIAGNOSIS — I495 Sick sinus syndrome: Secondary | ICD-10-CM

## 2021-02-12 DIAGNOSIS — R0789 Other chest pain: Secondary | ICD-10-CM

## 2021-02-12 DIAGNOSIS — I480 Paroxysmal atrial fibrillation: Secondary | ICD-10-CM

## 2021-02-12 DIAGNOSIS — I2510 Atherosclerotic heart disease of native coronary artery without angina pectoris: Secondary | ICD-10-CM | POA: Diagnosis not present

## 2021-02-12 DIAGNOSIS — D500 Iron deficiency anemia secondary to blood loss (chronic): Secondary | ICD-10-CM

## 2021-02-12 LAB — CUP PACEART INCLINIC DEVICE CHECK
Battery Remaining Longevity: 61 mo
Battery Voltage: 2.99 V
Brady Statistic RA Percent Paced: 98 %
Brady Statistic RV Percent Paced: 0.33 %
Date Time Interrogation Session: 20220930130012
Implantable Lead Implant Date: 20171002
Implantable Lead Implant Date: 20171002
Implantable Lead Location: 753859
Implantable Lead Location: 753860
Implantable Pulse Generator Implant Date: 20171002
Lead Channel Impedance Value: 362.5 Ohm
Lead Channel Impedance Value: 487.5 Ohm
Lead Channel Pacing Threshold Amplitude: 0.5 V
Lead Channel Pacing Threshold Amplitude: 0.75 V
Lead Channel Pacing Threshold Amplitude: 0.75 V
Lead Channel Pacing Threshold Pulse Width: 0.5 ms
Lead Channel Pacing Threshold Pulse Width: 0.5 ms
Lead Channel Pacing Threshold Pulse Width: 0.5 ms
Lead Channel Sensing Intrinsic Amplitude: 12 mV
Lead Channel Sensing Intrinsic Amplitude: 2 mV
Lead Channel Setting Pacing Amplitude: 1.5 V
Lead Channel Setting Pacing Amplitude: 2.5 V
Lead Channel Setting Pacing Pulse Width: 0.5 ms
Lead Channel Setting Sensing Sensitivity: 2 mV
Pulse Gen Model: 2272
Pulse Gen Serial Number: 7951215

## 2021-02-12 NOTE — Patient Instructions (Signed)
Medication Instructions:  Your physician recommends that you continue on your current medications as directed. Please refer to the Current Medication list given to you today.  *If you need a refill on your cardiac medications before your next appointment, please call your pharmacy*   Lab Work: BMET, Mg  If you have labs (blood work) drawn today and your tests are completely normal, you will receive your results only by: [place] (if you have MyChart) OR A paper copy in the mail If you have any lab test that is abnormal or we need to change your treatment, we will call you to review the results.   Testing/Procedures: Your physician has requested that you have an echocardiogram. Echocardiography is a painless test that uses sound waves to create images of your heart. It provides your doctor with information about the size and shape of your heart and how well your heart's chambers and valves are working. This procedure takes approximately one hour. There are no restrictions for this procedure.   Follow-Up: At [facility], you and your health needs are our priority.  As part of our continuing mission to provide you with exceptional heart care, we have created designated Provider Care Teams.  These Care Teams include your primary Cardiologist (physician) and Advanced Practice Providers (APPs -  Physician Assistants and Nurse Practitioners) who all work together to provide you with the care you need, when you need it.  We recommend signing up for the patient portal called "MyChart".  Sign up information is provided on this After Visit Summary.  MyChart is used to connect with patients for Virtual Visits (Telemedicine).  Patients are able to view lab/test results, encounter notes, upcoming appointments, etc.  Non-urgent messages can be sent to your provider as well.   To learn more about what you can do with MyChart, go to [email or web address].    Your next appointment:   6  month(s)  The format for your next appointment:   In Person  Provider:   You may see Virl Axe, MD or one of the following Advanced Practice Providers on your designated Care Team:   Tommye Standard, [place] "[facility]" [place], [place]

## 2021-02-12 NOTE — Progress Notes (Signed)
Electrophysiology Office Note Date: 02/12/2021  ID:  Donald [name], Donald [name] 05/20/1932, MRN [id number]  PCP: Virgie Dad, MD Primary Cardiologist: Virl Axe, MD Electrophysiologist: Virl Axe, MD   CC: Pacemaker follow-up  Donald [name] is a 87 y.o. male seen today for Virl Axe, MD for cardiac clearance.  Since last being seen in our clinic the patient reports doing OK from a cardiac perspective.   He had issues with anemia that led to SOB and fatigue. Colonoscopy and EGD revealed a cecal mass with bx showing adenocarcinoma. He was referred for consideration of colectomy and is now seen for clearance.   Pt denies dyspnea now that his anemia has been treated. He continues to have chronic, stable angina. At times when he is exercising he has a chest pressure that is relieved by rest. This is accompanied by SOB at the time. His example is that his water aerobics SOMETIMES cause it, but others he exercises without difficulty. Same goes for stairs and carrying objects like groceries.   Device History: St. Jude Dual Chamber PPM implanted 2017 for SSS  DATE TEST EF    10/15    Echo   EF 55 %  mild AS ( mean gradient 10) --? Bicuspid aortic valve  9/16 myoview EF 60-65%   mild ischemia  10/17 Echo EF 60-65%  No comment on bicuspid valve//mild - Mod  AI   11/18 Cath EF 55-65% Mod non obstructive CAD   1/20 Echo 60-65% LAE (52/2.5/45)     Past Medical History:  Diagnosis Date   Abnormal CT scan, stomach 02/2014   Thickening of gastric fundus and cardia.  gastritis on EGD 03/2014   Allergic rhinitis due to pollen    Arthropathy, unspecified, site unspecified    Atherosclerosis    a. Noted by abdominal CT 02/2014 (h/o normal nuc 2011).   Atrial flutter ([place]) 02/24/2014   s/p ablation 11/15   Cardiomyopathy ([place])    tachy mediated - a. TEE (10/15):  EF 30%;  b. Echo after NSR restored (10/15):  mild LVH, EF 55-60%, mild AS, mild AI, mild MR, mild to mod LAE, mild RAE   CHF  (congestive heart failure) ([place]) 01/04/2021   COVID-19 11/2020   Diverticulosis    severe in descending, sigmoid colon.    ED (erectile dysfunction)    Emphysema of lung ([place])    Esophageal stricture 03/2014   traversable with endoscope. not dilated.    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   Skin cancer    basal and squamous cell   Sleep apnea    Past Surgical History:  Procedure Laterality Date   ATRIAL FLUTTER ABLATION N/A 03/19/2014   RFCA of atrial flutter by Dr Caryl Comes   CARDIOVERSION N/A 02/24/2014   Procedure: CARDIOVERSION;  Surgeon: Lelon Perla, MD;  Location: [facility] ENDOSCOPY;  Service: Cardiovascular;  Laterality: N/A;   CARDIOVERSION Right 09/10/2014   Procedure: CARDIOVERSION;  Surgeon: Deboraha Sprang, MD;  Location: [facility] CATH LAB;  Service:  Cardiovascular;  Laterality: Right;   CARDIOVERSION N/A 01/22/2018   Procedure: CARDIOVERSION;  Surgeon: Fay Records, MD;  Location: [facility] ENDOSCOPY;  Service: Cardiovascular;  Laterality: N/A;   COLONOSCOPY N/A 04/23/2015   Procedure: COLONOSCOPY;  Surgeon: Ladene Artist, MD;  Location: [facility] ENDOSCOPY;  Service: Endoscopy;  Laterality: N/A;   COLONOSCOPY     ENTEROSCOPY N/A 06/12/2015   Procedure: ENTEROSCOPY;  Surgeon: Ladene Artist, MD;  Location: [facility] ENDOSCOPY;  Service: Endoscopy;  Laterality: N/A;   EP IMPLANTABLE DEVICE N/A 02/15/2016   Procedure: Pacemaker Implant;  Surgeon: Evans Lance, MD;  Location: [place] CV LAB;  Service: Cardiovascular;  Laterality: N/A;   ESOPHAGOGASTRODUODENOSCOPY N/A 04/09/2014   Procedure: ESOPHAGOGASTRODUODENOSCOPY (EGD);  Surgeon: Inda Castle,  MD;  Location: [place];  Service: Endoscopy;  Laterality: N/A;   ESOPHAGOGASTRODUODENOSCOPY N/A 04/23/2015   Procedure: ESOPHAGOGASTRODUODENOSCOPY (EGD);  Surgeon: Ladene Artist, MD;  Location: [facility] ENDOSCOPY;  Service: Endoscopy;  Laterality: N/A;   INGUINAL HERNIA REPAIR     right   INGUINAL HERNIA REPAIR     left   LEFT HEART CATH AND CORONARY ANGIOGRAPHY N/A 03/16/2017   Procedure: LEFT HEART CATH AND CORONARY ANGIOGRAPHY;  Surgeon: Burnell Blanks, MD;  Location: [place] CV LAB;  Service: Cardiovascular;  Laterality: N/A;   ORIF fracture of the elbow     SKIN CANCER EXCISION  05/21/2012   Squamous cell ca   TEE WITHOUT CARDIOVERSION N/A 02/24/2014   Procedure: TRANSESOPHAGEAL ECHOCARDIOGRAM (TEE);  Surgeon: Lelon Perla, MD;  Location: [facility] ENDOSCOPY;  Service: Cardiovascular;  Laterality: N/A;   TONSILLECTOMY     UPPER GASTROINTESTINAL ENDOSCOPY      Current Outpatient Medications  Medication Sig Dispense Refill   albuterol (VENTOLIN HFA) 108 (90 Base) MCG/ACT inhaler Inhale TWO puffs into THE lungs every SIX hours as needed FOR wheezing OR shortness of breath 8.5 g 0   apixaban (ELIQUIS) 5 MG TABS tablet Take 1 tablet (5 mg total) by mouth 2 (two) times daily. 60 tablet 6   diltiazem (CARDIZEM CD) 120 MG 24 hr capsule Take 1 capsule (120 mg total) by mouth 2 (two) times daily. 180 capsule 3   dofetilide (TIKOSYN) 500 MCG capsule Take 1 capsule (500 mcg total) by mouth 2 (two) times daily. Please keep upcoming appt in May 2022 with Dr. Caryl Comes before anymore refills. Thank you 60 capsule 11   ferrous sulfate 325 (65 FE) MG tablet Take 1 tablet (325 mg total) by mouth daily with breakfast. 60 tablet 3   FIBER PO Take by mouth.     furosemide (LASIX) 20 MG tablet TAKE ONE TABLET BY MOUTH ONCE DAILY 90 tablet 3   isosorbide mononitrate (IMDUR) 60 MG 24 hr tablet Take 1 tablet (60 mg total) by mouth daily. 90 tablet 3   Magnesium Aspartate 65 MG TABS Take 1 tablet by  mouth daily.     metoprolol tartrate (LOPRESSOR) 25 MG tablet Take 1 tablet (25 mg total) by mouth 2 (two) times daily. 60 tablet 5   metroNIDAZOLE (METROGEL) 0.75 % gel Apply 1 application topically daily as needed (for rosacea).      Multiple Vitamins-Minerals (EYE VITAMINS) CAPS Take 1 capsule by mouth 2 (two) times daily.     Multiple Vitamins-Minerals (MULTIVITAMIN PO) Take 1 tablet by mouth 2 (two) times daily.     nitroGLYCERIN (NITROSTAT) 0.4 MG SL tablet PLACE 1 TABLET UNDER THE TONGUE EVERY 5 MINUTES AS NEEDED FOR CHEST PAIN, MAY REPEAT  FOR 3 DOSES. 12 tablet 0   pantoprazole (PROTONIX) 40 MG tablet Take 1 tablet (40 mg total) by mouth 2 (two) times daily. Protonix ( pantoprazole ) 40 mg twice daily for 6 weeks, then reduce back to 40 mg daily. 60 tablet 6   Probiotic Product (ALIGN PO) Take by mouth.     rosuvastatin (CRESTOR) 10 MG tablet Take 1 tablet (10 mg total) by mouth daily. 30 tablet 3   No current facility-administered medications for this visit.    Allergies:   Patient has no known allergies.   Social History: Social History   Socioeconomic History   Marital status: Married    Spouse name: Not on file   Number of children: 3   Years of education: 16   Highest education level: Not on file  Occupational History   Occupation: Therapist, sports: RETIRED  Tobacco Use   Smoking status: Former    Packs/day: 0.00    Years: 40.00    Pack years: 0.00    Types: Cigarettes, Cigars    Quit date: 09/18/2008    Years since quitting: 12.4   Smokeless tobacco: Former    Types: Chew   Tobacco comments:    Has Occasional Cigar/ quit   Vaping Use   Vaping Use: Never used  Substance and Sexual Activity   Alcohol use: Yes    Alcohol/week: 28.0 standard drinks    Types: 21 Glasses of wine, 7 Shots of liquor per week    Comment: states he does want to drink less and cut back on the amount.    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 married 1961. 2 sons- '64, '63 , I daughter- '66, 4 grandchildren. work: Museum/gallery curator, retired but still consults. 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      No new stressors/       Consumption of caffeine-Coffee      Exercise- 30 mins swimming daily   Social Determinants of Health   Financial Resource Strain: Low Risk    Difficulty of Paying Living Expenses: Not hard at all  Food Insecurity: No Food Insecurity   Worried About Charity fundraiser in the Last Year: Never true   Ran Out of Food in the Last Year: Never true  Transportation Needs: No Transportation Needs   Lack of Transportation (Medical): No   Lack of Transportation (Non-Medical): No  Physical Activity: Sufficiently Active   Days of Exercise per Week: 5 days   Minutes of Exercise per Session: 30 min  Stress: No Stress Concern Present   Feeling of Stress : Not at all  Social Connections: Socially Integrated   Frequency of Communication with Friends and Family: More than three times a week   Frequency of Social Gatherings with Friends and Family: More than three times a week   Attends Religious Services: More than 4 times per year   Active Member of [employer] or Organizations: Yes   Attends Music therapist: More than 4 times per year   Marital Status: Married  Human resources officer Violence: Not on file    Family History: Family History  Problem Relation Age of Onset   Cancer Mother        ovarian   Other Father  renal disease- grief over loss of spouse   Cancer Brother        prostate- died of hematologic disorder 2nd to chemo   Pulmonary fibrosis Brother    Diabetes Neg Hx    Coronary artery disease Neg Hx    Colon cancer Neg Hx    Stomach cancer Neg Hx    Rectal cancer Neg Hx      Review of Systems: All other systems  reviewed and are otherwise negative except as noted above.  Physical Exam: Vitals:   02/12/21 1015  BP: 110/60  Pulse: 60  SpO2: 97%  Weight: 217 lb (98.4 kg)  Height: 5\' 9"  (1.753 m)     GEN- The patient is well appearing, alert and oriented x 3 today.   HEENT: normocephalic, atraumatic; sclera clear, conjunctiva pink; hearing intact; oropharynx clear; neck supple  Lungs- Clear to ausculation bilaterally, normal work of breathing.  No wheezes, rales, rhonchi Heart- Regular rate and rhythm, no murmurs, rubs or gallops  GI- soft, non-tender, non-distended, bowel sounds present  Extremities- no clubbing or cyanosis. No edema MS- no significant deformity or atrophy Skin- warm and dry, no rash or lesion; PPM pocket well healed Psych- euthymic mood, full affect Neuro- strength and sensation are intact  PPM Interrogation- reviewed in detail today,  See PACEART report  EKG:  EKG is ordered today. Personal review of ekg ordered today shows NSR at 60 bpm, QTc stable   Recent Labs: 08/07/2020: Magnesium 1.9 01/07/2021: ALT 10; ALT 10; B Natriuretic Peptide 286.00; Potassium 4.5; Potassium 4.5; Sodium 138; Sodium 138; TSH 0.63 01/19/2021: Hemoglobin 7.3; Platelet Count 294 02/03/2021: BUN 12; Creatinine, Ser 0.91   Wt Readings from Last 3 Encounters:  02/12/21 217 lb (98.4 kg)  02/08/21 221 lb (100.2 kg)  02/03/21 220 lb (99.8 kg)     Other studies Reviewed: Additional studies/ records that were reviewed today include: Previous EP office notes, Previous remote checks, Most recent labwork.   Assessment and Plan:  1. Sick sinus syndrome s/p St. Jude PPM  Normal PPM function See Pace Art report No changes today  2. Persistent atrial fibirllation Mostly maintaining sinus on tikosyn 500 mcg BID. Brief AF/AT noted chronically. Surveillance labs today, have been stable. We discussed that this will prolong his admission as he will very likely be required to miss several doses post  colectomy.   3. Chronic diastolic CHF Volume status OK.   4. Chronic stable angina LHC 2018 with non-obstructive CAD No utility for myoview as previously abnormal.  5. Cardiac Clearance for partial colectomy Echo 05/2018 LVEF 60-65% Per office protocol he made hold Eliquis for 2 days prior to procedure if decision is made to proceed From a Cardiac Perspective, he is at a high risk. Discussed with Dr. Caryl Comes. If Echo is stable from previous, would not plan on Cath for chronic, stable angina.  Pt understands that given his history he is at a higher risk than average of perioperative complications from a cardiac perspective Clearance recommend pending Echo.   Current medicines are reviewed at length with the patient today.    Labs/ tests ordered today include:  Orders Placed This Encounter  Procedures   Basic metabolic panel   Magnesium   EKG 12-Lead   ECHOCARDIOGRAM COMPLETE    Disposition:   Follow up with Dr. Caryl Comes in 6 months. Sooner with issues or abnormal Echo. If goes through with surgery and requires holding tikosyn, we should be consulted to follow for tikosyn resumption  and monitoring QTc  Signed, Annamaria Helling  02/12/2021 10:29 AM  [facility] [address] [place]  [place] [address] [phone] (office) [phone] (fax)

## 2021-02-12 NOTE — Telephone Encounter (Signed)
Pt aware that appt was linked to ct abdomen

## 2021-02-13 LAB — CBC WITH DIFFERENTIAL/PLATELET
Absolute Monocytes: 800 cells/uL (ref 200–950)
Basophils Absolute: 72 cells/uL (ref 0–200)
Basophils Relative: 0.9 %
Eosinophils Absolute: 168 cells/uL (ref 15–500)
Eosinophils Relative: 2.1 %
HCT: 30.4 % — ABNORMAL LOW (ref 38.5–50.0)
Hemoglobin: 9.3 g/dL — ABNORMAL LOW (ref 13.2–17.1)
Lymphs Abs: 1616 cells/uL (ref 850–3900)
MCH: 26.4 pg — ABNORMAL LOW (ref 27.0–33.0)
MCHC: 30.6 g/dL — ABNORMAL LOW (ref 32.0–36.0)
MCV: 86.4 fL (ref 80.0–100.0)
MPV: 10.8 fL (ref 7.5–12.5)
Monocytes Relative: 10 %
Neutro Abs: 5344 cells/uL (ref 1500–7800)
Neutrophils Relative %: 66.8 %
Platelets: 314 10*3/uL (ref 140–400)
RBC: 3.52 10*6/uL — ABNORMAL LOW (ref 4.20–5.80)
RDW: 14.6 % (ref 11.0–15.0)
Total Lymphocyte: 20.2 %
WBC: 8 10*3/uL (ref 3.8–10.8)

## 2021-02-13 LAB — BASIC METABOLIC PANEL WITH GFR
BUN/Creatinine Ratio: 14 (ref 10–24)
BUN: 16 mg/dL (ref 8–27)
CO2: 22 mmol/L (ref 20–29)
Calcium: 9.2 mg/dL (ref 8.6–10.2)
Chloride: 104 mmol/L (ref 96–106)
Creatinine, Ser: 1.18 mg/dL (ref 0.76–1.27)
Glucose: 104 mg/dL — ABNORMAL HIGH (ref 70–99)
Potassium: 4.8 mmol/L (ref 3.5–5.2)
Sodium: 142 mmol/L (ref 134–144)
eGFR: 60 mL/min/1.73

## 2021-02-13 LAB — MAGNESIUM: Magnesium: 1.9 mg/dL (ref 1.6–2.3)

## 2021-02-13 LAB — IRON, TOTAL/TOTAL IRON BINDING CAP
%SAT: 6 % — ABNORMAL LOW (ref 20–48)
Iron: 21 ug/dL — ABNORMAL LOW (ref 50–180)
TIBC: 366 ug/dL (ref 250–425)

## 2021-02-13 LAB — FERRITIN: Ferritin: 15 ng/mL — ABNORMAL LOW (ref 24–380)

## 2021-02-15 ENCOUNTER — Encounter (HOSPITAL_COMMUNITY): Payer: Self-pay

## 2021-02-15 ENCOUNTER — Ambulatory Visit (HOSPITAL_COMMUNITY): Admission: RE | Admit: 2021-02-15 | Payer: PPO | Source: Ambulatory Visit

## 2021-02-15 ENCOUNTER — Other Ambulatory Visit: Payer: Self-pay | Admitting: Internal Medicine

## 2021-02-16 ENCOUNTER — Ambulatory Visit (HOSPITAL_COMMUNITY)
Admission: RE | Admit: 2021-02-16 | Discharge: 2021-02-16 | Disposition: A | Discharge status: Home / Self Care | Payer: PPO | Source: Ambulatory Visit | Attending: Gastroenterology | Admitting: Gastroenterology

## 2021-02-16 ENCOUNTER — Ambulatory Visit: Payer: PPO | Admitting: Gastroenterology

## 2021-02-16 ENCOUNTER — Ambulatory Visit (HOSPITAL_COMMUNITY): Admission: RE | Admit: 2021-02-16 | Payer: PPO | Source: Ambulatory Visit

## 2021-02-16 ENCOUNTER — Other Ambulatory Visit: Payer: Self-pay

## 2021-02-16 ENCOUNTER — Ambulatory Visit (INDEPENDENT_AMBULATORY_CARE_PROVIDER_SITE_OTHER): Payer: PPO

## 2021-02-16 DIAGNOSIS — J849 Interstitial pulmonary disease, unspecified: Secondary | ICD-10-CM | POA: Diagnosis not present

## 2021-02-16 DIAGNOSIS — J439 Emphysema, unspecified: Secondary | ICD-10-CM | POA: Diagnosis not present

## 2021-02-16 DIAGNOSIS — I495 Sick sinus syndrome: Secondary | ICD-10-CM

## 2021-02-16 DIAGNOSIS — J189 Pneumonia, unspecified organism: Secondary | ICD-10-CM | POA: Diagnosis not present

## 2021-02-16 DIAGNOSIS — K5730 Diverticulosis of large intestine without perforation or abscess without bleeding: Secondary | ICD-10-CM | POA: Diagnosis not present

## 2021-02-16 DIAGNOSIS — K7689 Other specified diseases of liver: Secondary | ICD-10-CM | POA: Diagnosis not present

## 2021-02-16 DIAGNOSIS — I700 Atherosclerosis of aorta: Secondary | ICD-10-CM | POA: Diagnosis not present

## 2021-02-16 DIAGNOSIS — K4090 Unilateral inguinal hernia, without obstruction or gangrene, not specified as recurrent: Secondary | ICD-10-CM | POA: Diagnosis not present

## 2021-02-16 DIAGNOSIS — K6389 Other specified diseases of intestine: Secondary | ICD-10-CM | POA: Diagnosis not present

## 2021-02-16 LAB — CUP PACEART REMOTE DEVICE CHECK
Battery Remaining Longevity: 57 mo
Battery Remaining Percentage: 51 %
Battery Voltage: 2.99 V
Brady Statistic AP VP Percent: 1 %
Brady Statistic AP VS Percent: 97 %
Brady Statistic AS VP Percent: 1 %
Brady Statistic AS VS Percent: 2.7 %
Brady Statistic RA Percent Paced: 97 %
Brady Statistic RV Percent Paced: 1 %
Date Time Interrogation Session: 20221004020015
Implantable Lead Implant Date: 20171002
Implantable Lead Implant Date: 20171002
Implantable Lead Location: 753859
Implantable Lead Location: 753860
Implantable Pulse Generator Implant Date: 20171002
Lead Channel Impedance Value: 350 Ohm
Lead Channel Impedance Value: 450 Ohm
Lead Channel Pacing Threshold Amplitude: 0.5 V
Lead Channel Pacing Threshold Amplitude: 0.75 V
Lead Channel Pacing Threshold Pulse Width: 0.5 ms
Lead Channel Pacing Threshold Pulse Width: 0.5 ms
Lead Channel Sensing Intrinsic Amplitude: 12 mV
Lead Channel Sensing Intrinsic Amplitude: 2.2 mV
Lead Channel Setting Pacing Amplitude: 1.5 V
Lead Channel Setting Pacing Amplitude: 2.5 V
Lead Channel Setting Pacing Pulse Width: 0.5 ms
Lead Channel Setting Sensing Sensitivity: 2 mV
Pulse Gen Model: 2272
Pulse Gen Serial Number: 7951215

## 2021-02-16 MED ORDER — IOHEXOL 350 MG/ML SOLN
80.0000 mL | Freq: Once | INTRAVENOUS | Status: AC | PRN
  Administered 2021-02-16: 80 mL via INTRAVENOUS

## 2021-02-16 NOTE — Telephone Encounter (Signed)
Pt last saw Oda Kilts, [place] 02/12/21, last labs 02/12/21 Creat 1.18, age 87, weight 98.4kg, based on specified criteria pt is on appropriate dosage of Eliquis 5mg  BID.  Will refill rx.

## 2021-02-17 ENCOUNTER — Encounter: Payer: Self-pay | Admitting: Pulmonary Disease

## 2021-02-17 ENCOUNTER — Ambulatory Visit: Payer: PPO | Admitting: Pulmonary Disease

## 2021-02-17 ENCOUNTER — Ambulatory Visit (HOSPITAL_COMMUNITY): Payer: PPO

## 2021-02-17 VITALS — BP 126/62 | HR 60 | Ht 69.0 in | Wt 217.2 lb

## 2021-02-17 DIAGNOSIS — G4733 Obstructive sleep apnea (adult) (pediatric): Secondary | ICD-10-CM

## 2021-02-17 DIAGNOSIS — J849 Interstitial pulmonary disease, unspecified: Secondary | ICD-10-CM

## 2021-02-17 NOTE — Patient Instructions (Addendum)
I reviewed his CT scan which shows stable inflammation in the lung There is increased risk for surgery but I believe the test now contraindication from a lung standpoint I will send the results of my assessment to Dr. Leighton Ruff Follow-up in 6 months

## 2021-02-17 NOTE — Progress Notes (Signed)
Donald [name]    [id number]    [birth year]  Primary Care Physician:Gupta, Rene Kocher, MD  Referring Physician: Virgie Dad, MD [name],  [place] [id number]  Chief complaint: Follow-up for pulmonary fibrosis  HPI: 87 year old with recurrent atrial fibrillation, OSA, ex-smoker.  Evaluated in the [facility] clinic on 05/24/2022 after high-resolution CT scan that showed pulmonary fibrosis.  At that point he was being considered for amiodarone for atrial fibrillation.  Seen by Dr. Chase Caller and recommended against amiodarone.  He is currently on Tikosyn and follows with Dr. Caryl Comes, EP  Pets: No pets Occupation: Worked as a Land Exposures: Had a down pillow and comforters which he got rid of last month.  No ongoing exposure.  No mold, hot tub, Jacuzzi, asbestos, humidifier Smoking history: 50-pack-year smoker.  Quit in 2000 Travel history: From [place].  No significant Relevant family history: Brother died of pulmonary fibrosis.  Interim history: Here for follow-up of indeterminate pulmonary fibrosis His breathing is doing well with no issues He remains active by doing swimming and water aerobics for half an hour every day.  He had a diagnosis of colon cancer recently and has seen Dr. Leighton Ruff.  Plan is for robotic colectomy in the near future.  He is awaiting cardiology clearance.  Outpatient Encounter Medications as of 02/17/2021  Medication Sig   albuterol (VENTOLIN HFA) 108 (90 Base) MCG/ACT inhaler Inhale TWO puffs into THE lungs every SIX hours as needed FOR wheezing OR shortness of breath   diltiazem (CARDIZEM CD) 120 MG 24 hr capsule Take 1 capsule (120 mg total) by mouth 2 (two) times daily.   dofetilide (TIKOSYN) 500 MCG capsule Take 1 capsule (500 mcg total) by mouth 2 (two) times daily. Please keep upcoming appt in May 2022 with Dr. Caryl Comes before anymore refills. Thank you   ELIQUIS 5 MG TABS tablet TAKE ONE TABLET BY MOUTH EVERY  MORNING and TAKE ONE TABLET BY MOUTH EVERYDAY AT BEDTIME   ferrous sulfate 325 (65 FE) MG tablet Take 1 tablet (325 mg total) by mouth daily with breakfast.   FIBER PO Take by mouth.   furosemide (LASIX) 20 MG tablet TAKE ONE TABLET BY MOUTH ONCE DAILY   isosorbide mononitrate (IMDUR) 60 MG 24 hr tablet Take 1 tablet (60 mg total) by mouth daily.   Magnesium Aspartate 65 MG TABS Take 1 tablet by mouth daily.   metoprolol tartrate (LOPRESSOR) 25 MG tablet TAKE ONE TABLET BY MOUTH EVERY MORNING and TAKE ONE TABLET EVERYDAY AT BEDTIME   metroNIDAZOLE (METROGEL) 0.75 % gel Apply 1 application topically daily as needed (for rosacea).    Multiple Vitamins-Minerals (EYE VITAMINS) CAPS Take 1 capsule by mouth 2 (two) times daily.   Multiple Vitamins-Minerals (MULTIVITAMIN PO) Take 1 tablet by mouth 2 (two) times daily.   pantoprazole (PROTONIX) 40 MG tablet Take 1 tablet (40 mg total) by mouth 2 (two) times daily. Protonix ( pantoprazole ) 40 mg twice daily for 6 weeks, then reduce back to 40 mg daily.   Probiotic Product (ALIGN PO) Take by mouth.   rosuvastatin (CRESTOR) 10 MG tablet Take 1 tablet (10 mg total) by mouth daily.   nitroGLYCERIN (NITROSTAT) 0.4 MG SL tablet PLACE 1 TABLET UNDER THE TONGUE EVERY 5 MINUTES AS NEEDED FOR CHEST PAIN, MAY REPEAT FOR 3 DOSES. (Patient not taking: Reported on 02/17/2021)   No facility-administered encounter medications on file as of 02/17/2021.    Allergies  as of 02/17/2021   (No Known Allergies)    Physical Exam: Blood pressure 126/62, pulse 60, height 5\' 9"  (1.753 m), weight 217 lb 3.2 oz (98.5 kg), SpO2 97 %. Gen:      No acute distress HEENT:  EOMI, sclera anicteric Neck:     No masses; no thyromegaly Lungs:    Clear to auscultation bilaterally; normal respiratory effort CV:         Regular rate and rhythm; no murmurs Abd:      + bowel sounds; soft, non-tender; no palpable masses, no distension Ext:    No edema; adequate peripheral perfusion Skin:       Warm and dry; no rash Neuro: alert and oriented x 3 Psych: normal mood and affect   Data Reviewed: Imaging: CT chest 05/07/2013- mild emphysematous changes, compressive atelectasis in the right lower lobe.   CT high-resolution 04/23/2018- bronchial wall thickening with mild emphysema.  Patchy areas of peripheral septal thickening, mild groundglass.  Indeterminate for UIP. CT high-resolution 05/06/2020- mild emphysema, stable mild interstitial lung disease, indeterminate for UIP. CT high-resolution 02/16/2021-stable interstitial lung disease in indeterminate pattern. I have reviewed the images personally.  PFTs: 04/18/2018 FVC 3.43 [96%), FEV1 2.63 [105%], F/F 77, TLC 6.37 [95%], DLCO 17.94 [60%]  05/18/2020 FVC 3.39 [97%], FEV1 2.60 [108%], F/F 77, TLC 5.54 [83%], DLCO 16.87 [75%] Mild diffusion defect  Labs: Hypersensitivity panel 05/24/2018-negative CTD serologies-ANA negative, CCP less than 16, double-stranded DNA, Ro, La, SCL 70- Rheumatoid factor 14  Aldolase 5.3 CK 3 1 ANCA-negative  Myositis panel 07/12/2018-negative  Assessment:  Preop assessment Peri-operative Assessment of Pulmonary Risk for Non-Thoracic Surgery: He is scheduled for robotic colectomy for recent diagnosis of colon cancer His interstitial lung disease is mild and stable on CT imaging.  PFTs are okay with only mild diffusion defect and preserved lung volumes   ForMr. [name], risk of perioperative pulmonary complications is increased by:  Age greater than 30 years  Obstructive sleep apnea   Respiratory complications generally occur in 1% of ASA Class I patients, 5% of ASA Class II and 10% of ASA Class III-IV patients These complications rarely result in mortality and iclude postoperative pneumonia, atelectasis, pulmonary embolism, ARDS and increased time requiring postoperative mechanical ventilation.  Overall, I recommend proceeding with the surgery if the risk for respiratory complications are outweighed  by the potential benefits. This will need to be discussed between the patient and surgeon.  To reduce risks of respiratory complications, I recommend: --Pre- and post-operative incentive spirometry performed frequently while awake --Inpatient use of currently prescribed positive-pressure for OSA whenever the patient is sleeping --Avoiding use of pancuronium during anesthesia.  I have discussed the risk factors and recommendations above with the patient.   Plan/Recommendations: -No contraindication for planned colectomy  Follow-up in 6 months  Marshell Garfinkel MD [facility] 02/17/2021, 10:29 AM  CC: Virgie Dad, MD

## 2021-02-19 ENCOUNTER — Telehealth: Payer: Self-pay | Admitting: General Surgery

## 2021-02-19 ENCOUNTER — Encounter: Payer: Self-pay | Admitting: *Deleted

## 2021-02-19 ENCOUNTER — Ambulatory Visit (INDEPENDENT_AMBULATORY_CARE_PROVIDER_SITE_OTHER): Payer: PPO

## 2021-02-19 ENCOUNTER — Other Ambulatory Visit: Payer: Self-pay

## 2021-02-19 DIAGNOSIS — R0789 Other chest pain: Secondary | ICD-10-CM

## 2021-02-19 LAB — ECHOCARDIOGRAM COMPLETE
AR max vel: 1.22 cm2
AV Area VTI: 1.44 cm2
AV Area mean vel: 1.33 cm2
AV Mean grad: 12 mmHg
AV Peak grad: 25.4 mmHg
Ao pk vel: 2.52 m/s
Area-P 1/2: 2.96 cm2
MV M vel: 5.22 m/s
MV Peak grad: 109 mmHg
P 1/2 time: 431 msec
Radius: 0.8 cm
S' Lateral: 2.81 cm

## 2021-02-19 NOTE — Telephone Encounter (Signed)
See additional notes.

## 2021-02-19 NOTE — Telephone Encounter (Signed)
The patient contacted the office and we discussed his results. The patient stated he did see them on my chart and understands the report. He is waiting for his cardiologist to contact the colorectal surgeon. The patient would like to hold off on another CT of the liver if possible. If not would like to do [facility].

## 2021-02-19 NOTE — Telephone Encounter (Signed)
-----   Message from Piqua, DO sent at 02/18/2021  3:07 PM EDT ----- CT abdomen/pelvis results reviewed.  Circumferential wall thickening in the ascending colon compatible with recently diagnosed colon cancer.  There is a 1.1 centimeter lymph node in the RLQ, but no other lymphadenopathy noted.  Multiple liver cysts noted, and a few subcentimeter low attenuating liver lesions which are too small to fully characterize.  Please call patient to review these results with him.  I will also forward these results to his Colorectal Surgeon for review and discuss whether or not dedicated CT three-phase liver is needed.

## 2021-02-19 NOTE — Telephone Encounter (Signed)
Left message for the patient to call for results

## 2021-02-19 NOTE — Telephone Encounter (Signed)
-----   Message from Kirkman, DO sent at 02/18/2021  3:07 PM EDT ----- CT abdomen/pelvis results reviewed.  Circumferential wall thickening in the ascending colon compatible with recently diagnosed colon cancer.  There is a 1.1 centimeter lymph node in the RLQ, but no other lymphadenopathy noted.  Multiple liver cysts noted, and a few subcentimeter low attenuating liver lesions which are too small to fully characterize.  Please call patient to review these results with him.  I will also forward these results to his Colorectal Surgeon for review and discuss whether or not dedicated CT three-phase liver is needed.

## 2021-02-23 ENCOUNTER — Telehealth: Payer: Self-pay | Admitting: *Deleted

## 2021-02-24 ENCOUNTER — Ambulatory Visit: Payer: PPO | Admitting: Internal Medicine

## 2021-02-24 NOTE — Progress Notes (Signed)
Remote pacemaker transmission.   

## 2021-02-25 ENCOUNTER — Other Ambulatory Visit: Payer: Self-pay

## 2021-02-25 ENCOUNTER — Inpatient Hospital Stay: Payer: PPO | Attending: Oncology | Admitting: Oncology

## 2021-02-25 VITALS — BP 111/59 | HR 73 | Temp 97.8°F | Resp 18 | Ht 69.0 in | Wt 217.8 lb

## 2021-02-25 DIAGNOSIS — I4891 Unspecified atrial fibrillation: Secondary | ICD-10-CM | POA: Insufficient documentation

## 2021-02-25 DIAGNOSIS — D649 Anemia, unspecified: Secondary | ICD-10-CM | POA: Diagnosis not present

## 2021-02-25 DIAGNOSIS — Z950 Presence of cardiac pacemaker: Secondary | ICD-10-CM | POA: Insufficient documentation

## 2021-02-25 DIAGNOSIS — Z7901 Long term (current) use of anticoagulants: Secondary | ICD-10-CM | POA: Insufficient documentation

## 2021-02-25 DIAGNOSIS — I5032 Chronic diastolic (congestive) heart failure: Secondary | ICD-10-CM | POA: Diagnosis not present

## 2021-02-25 DIAGNOSIS — C180 Malignant neoplasm of cecum: Secondary | ICD-10-CM | POA: Insufficient documentation

## 2021-02-25 DIAGNOSIS — K769 Liver disease, unspecified: Secondary | ICD-10-CM | POA: Diagnosis not present

## 2021-02-25 DIAGNOSIS — J849 Interstitial pulmonary disease, unspecified: Secondary | ICD-10-CM | POA: Insufficient documentation

## 2021-02-25 DIAGNOSIS — D500 Iron deficiency anemia secondary to blood loss (chronic): Secondary | ICD-10-CM | POA: Diagnosis not present

## 2021-02-25 NOTE — Progress Notes (Signed)
[place] New Patient Consult   Requesting MD: Raliegh Ip [address],   [id number]   Donald [name] 87 y.o.  11-28-1932    Reason for Consult: Colon cancer   HPI: Donald [name] reports exertional dyspnea for the past year.  A CBC on 01/07/2021 found the hemoglobin at 8.1 with hematocrit of 25%.  He was referred to Dr. Alen Blew on 01/19/2021.  The hemoglobin returned at 7.3, platelets 294,000, white count 8, ANC 5.5, and MCV 90.4.  The ferritin returned at 10.  He was transfused with 2 units of packed red cells on 01/21/2021.  He reports improvement in exertional dyspnea following the Red cell transfusion.  He was referred to Dr. Bryan Lemma and underwent an endoscopy and colonoscopy on 02/03/2021.  A mass was found in the cecum with oozing present.  Polyps were found in the cecum.  These were not resected.  5 polyps were found in the transverse and descending colon.  The polyps were removed.  A polyp was removed from the sigmoid colon.  Polyps were removed in the rectum.  The upper and anoscopy revealed a mild Schatzki ring, 3 cm hiatal hernia, gastritis, and a single nonbleeding angiectasia in the stomach.  A single nonbleeding angiectasia was noted in the duodenum.  The pathology revealed no malignancy on the stomach biopsy.  The cecum biopsy returned as adenocarcinoma.  The transverse and a sending polyps returned as tubular adenomas and a sessile serrated polyp.  There were microscopic fragments of adenocarcinoma felt to be a contaminant from the cecum biopsy.  The sigmoid polyp returned as a serrated adenoma in the rectum polyps were hyperplastic polyps.  Donald [name] was referred to Dr. Marcello Moores for surgical evaluation.  He has a history of obstructive sleep apnea, interstitial lung disease, atrial fibrillation, and CHF.  He has been evaluated by pulmonary medicine and cardiology for surgical clearance.  He is felt to have acceptable surgical risk  for the pulmonary evaluation.  Cardiology feels he is at high risk of surgical complications.  Past Medical History:  Diagnosis Date   Abnormal CT scan, stomach 02/2014   Thickening of gastric fundus and cardia.  gastritis on EGD 03/2014   Allergic rhinitis due to pollen    Arthropathy, unspecified, site unspecified    Atherosclerosis    a. Noted by abdominal CT 02/2014 (h/o normal nuc 2011).   Atrial flutter ([place]) 02/24/2014   s/p ablation 11/15   Cardiomyopathy ([place])    tachy mediated - a. TEE (10/15):  EF 30%;  b. Echo after NSR restored (10/15):  mild LVH, EF 55-60%, mild AS, mild AI, mild MR, mild to mod LAE, mild RAE   CHF (congestive heart failure) ([place]) 01/04/2021   COVID-19 11/2020   Diverticulosis    severe in descending, sigmoid colon.    ED (erectile dysfunction)    Emphysema of lung ([place])    Esophageal stricture 03/2014   traversable with endoscope. not dilated.    Gastric AVM    GERD (gastroesophageal reflux disease) 03/2014   small HH and gastritis on EGD   GI bleed    Habitual alcohol use    Hemorrhoids    Hiatal hernia    Hyperlipidemia    Hypertension    Obesity    OSA on CPAP    PAF (paroxysmal atrial fibrillation) ([facility])    Prostatitis, unspecified    Pulmonary fibrosis ([facility])    Sinus bradycardia  a. s/p STJ dual chamber PPM   Skin cancer    basal and squamous cell   Sleep apnea     Past Surgical History:  Procedure Laterality Date   ATRIAL FLUTTER ABLATION N/A 03/19/2014   RFCA of atrial flutter by Dr Caryl Comes   CARDIOVERSION N/A 02/24/2014   Procedure: CARDIOVERSION;  Surgeon: Lelon Perla, MD;  Location: [facility] ENDOSCOPY;  Service: Cardiovascular;  Laterality: N/A;   CARDIOVERSION Right 09/10/2014   Procedure: CARDIOVERSION;  Surgeon: Deboraha Sprang, MD;  Location: [facility] CATH LAB;  Service: Cardiovascular;  Laterality: Right;   CARDIOVERSION N/A 01/22/2018   Procedure: CARDIOVERSION;  Surgeon: Fay Records, MD;  Location: [facility] ENDOSCOPY;  Service:  Cardiovascular;  Laterality: N/A;   COLONOSCOPY N/A 04/23/2015   Procedure: COLONOSCOPY;  Surgeon: Ladene Artist, MD;  Location: [facility] ENDOSCOPY;  Service: Endoscopy;  Laterality: N/A;   COLONOSCOPY     ENTEROSCOPY N/A 06/12/2015   Procedure: ENTEROSCOPY;  Surgeon: Ladene Artist, MD;  Location: [facility] ENDOSCOPY;  Service: Endoscopy;  Laterality: N/A;   EP IMPLANTABLE DEVICE N/A 02/15/2016   Procedure: Pacemaker Implant;  Surgeon: Evans Lance, MD;  Location: [facility] CV LAB;  Service: Cardiovascular;  Laterality: N/A;   ESOPHAGOGASTRODUODENOSCOPY N/A 04/09/2014   Procedure: ESOPHAGOGASTRODUODENOSCOPY (EGD);  Surgeon: Inda Castle, MD;  Location: [facility];  Service: Endoscopy;  Laterality: N/A;   ESOPHAGOGASTRODUODENOSCOPY N/A 04/23/2015   Procedure: ESOPHAGOGASTRODUODENOSCOPY (EGD);  Surgeon: Ladene Artist, MD;  Location: [facility] ENDOSCOPY;  Service: Endoscopy;  Laterality: N/A;   INGUINAL HERNIA REPAIR     right   INGUINAL HERNIA REPAIR     left   LEFT HEART CATH AND CORONARY ANGIOGRAPHY N/A 03/16/2017   Procedure: LEFT HEART CATH AND CORONARY ANGIOGRAPHY;  Surgeon: Burnell Blanks, MD;  Location: [facility] CV LAB;  Service: Cardiovascular;  Laterality: N/A;   ORIF fracture of the elbow     SKIN CANCER EXCISION  05/21/2012   Squamous cell ca   TEE WITHOUT CARDIOVERSION N/A 02/24/2014   Procedure: TRANSESOPHAGEAL ECHOCARDIOGRAM (TEE);  Surgeon: Lelon Perla, MD;  Location: [facility] ENDOSCOPY;  Service: Cardiovascular;  Laterality: N/A;   TONSILLECTOMY     UPPER GASTROINTESTINAL ENDOSCOPY      Medications: Reviewed  Allergies: No Known Allergies  Family history: His mother died of ovarian cancer in her 6s.  A brother had prostate cancer.  Social History:   He lives with his wife in [place].  He is retired Chief Financial Officer.  He quit smoking cigarettes in 1994.  He reports social alcohol use.  He received a Red cell transfusion in September 2022  ROS:    Positives include: Exertional dyspnea, dark stool  A complete ROS was otherwise negative.  Physical Exam:  Blood pressure (!) 111/59, pulse 73, temperature 97.8 F (36.6 C), temperature source Oral, resp. rate 18, height 5\' 9"  (1.753 m), weight 217 lb 12.8 oz (98.8 kg), SpO2 100 %.  Lungs: Clear bilaterally Cardiac: Regular rate and rhythm Abdomen: No hepatosplenomegaly, tender in the right lower abdomen, no mass  Vascular: No leg edema Lymph nodes: No cervical, supraclavicular, axillary, or inguinal nodes Neurologic: Alert and oriented, the motor exam appears intact in the upper and lower extremities bilaterally Skin: No rash, 2 cm soft mobile cutaneous oval lesion at the left axilla Musculoskeletal: No spine tenderness   LAB:  CBC  Lab Results  Component Value Date   WBC 8.0 02/12/2021   HGB 9.3 (L) 02/12/2021   HCT 30.4 (  L) 02/12/2021   MCV 86.4 02/12/2021   PLT 314 02/12/2021   NEUTROABS 5,344 02/12/2021        CMP  Lab Results  Component Value Date   NA 142 02/12/2021   K 4.8 02/12/2021   CL 104 02/12/2021   CO2 22 02/12/2021   GLUCOSE 104 (H) 02/12/2021   BUN 16 02/12/2021   CREATININE 1.18 02/12/2021   CALCIUM 9.2 02/12/2021   PROT 6.8 03/05/2020   ALBUMIN 4.2 01/07/2021   ALBUMIN 4.2 01/07/2021   AST 18 01/07/2021   AST 18 01/07/2021   ALT 10 01/07/2021   ALT 10 01/07/2021   ALKPHOS 94 01/07/2021   ALKPHOS 94 01/07/2021   BILITOT 0.5 03/05/2020   GFRNONAA >60 08/07/2020   GFRAA >60 02/25/2019    CEA on 02/03/2021: 1.5  Imaging: As per HPI-CT images from 02/16/2021 reviewed with Donald [name] and his wife    Assessment/Plan:   Colon cancer-cecum mass biopsy 02/03/2021-adenocarcinoma CTs 02/16/2021-stable interstitial lung disease, mild COPD, cardiomegaly, CAD, multiple liver cysts with a few subcentimeter too small to characterize liver lesions, circumferential wall thickening at the ileocecal valve, right lower quadrant ileocolic lymph node  measures 1.1 cm CAD Interstitial lung disease Iron deficiency anemia secondary to #1 Multiple colon polyps on the colonoscopy 02/03/2021-microscopic fragments of adenocarcinoma involving biopsies of transverse and ascending colon polyps felt to be contaminant from the cecum biopsy OSA Sick sinus syndrome-pacemaker Atrial fibrillation-maintained on apixaban Chronic diastolic CHF History of multiple skin cancers   Disposition:   Donald [name] has been diagnosed with colon cancer.  There is a cecal mass.  There is no evidence of metastatic disease, though there are indeterminate small liver lesions that appear to have been present on previous CTs.  I will present his case at the GI tumor conference for further review of the CT images.  He has multiple comorbid conditions.  He is maintained on anticoagulation therapy for atrial fibrillation.  He has symptomatic anemia secondary to bleeding.  I discussed the treatment of colon cancer with Donald [name] and his wife.  He understands he has increased risk of surgical complications based on the evaluation by cardiology.  He understands the only potentially curative treatment is surgery.  He would like to proceed with surgery for the colon tumor.  He is scheduled to see Dr. Marcello Moores next week.  He will return for a CBC tomorrow.  We will arrange for Red cell transfusion support as needed.  I will see him after surgery to review the surgical pathology and make adjuvant treatment recommendations.  Betsy Coder, MD  02/25/2021, 4:53 PM

## 2021-02-26 ENCOUNTER — Encounter: Payer: Self-pay | Admitting: Oncology

## 2021-02-26 ENCOUNTER — Other Ambulatory Visit: Payer: Self-pay

## 2021-02-26 ENCOUNTER — Inpatient Hospital Stay: Payer: PPO

## 2021-02-26 DIAGNOSIS — D649 Anemia, unspecified: Secondary | ICD-10-CM

## 2021-02-26 DIAGNOSIS — C180 Malignant neoplasm of cecum: Secondary | ICD-10-CM | POA: Diagnosis not present

## 2021-02-26 LAB — CBC WITH DIFFERENTIAL (CANCER CENTER ONLY)
Abs Immature Granulocytes: 0.02 10*3/uL (ref 0.00–0.07)
Basophils Absolute: 0.1 10*3/uL (ref 0.0–0.1)
Basophils Relative: 1 %
Eosinophils Absolute: 0.2 10*3/uL (ref 0.0–0.5)
Eosinophils Relative: 3 %
HCT: 27.3 % — ABNORMAL LOW (ref 39.0–52.0)
Hemoglobin: 8.3 g/dL — ABNORMAL LOW (ref 13.0–17.0)
Immature Granulocytes: 0 %
Lymphocytes Relative: 16 %
Lymphs Abs: 1.3 10*3/uL (ref 0.7–4.0)
MCH: 25.9 pg — ABNORMAL LOW (ref 26.0–34.0)
MCHC: 30.4 g/dL (ref 30.0–36.0)
MCV: 85 fL (ref 80.0–100.0)
Monocytes Absolute: 0.8 10*3/uL (ref 0.1–1.0)
Monocytes Relative: 9 %
Neutro Abs: 5.7 10*3/uL (ref 1.7–7.7)
Neutrophils Relative %: 71 %
Platelet Count: 286 10*3/uL (ref 150–400)
RBC: 3.21 MIL/uL — ABNORMAL LOW (ref 4.22–5.81)
RDW: 16.1 % — ABNORMAL HIGH (ref 11.5–15.5)
WBC Count: 8.1 10*3/uL (ref 4.0–10.5)
nRBC: 0 % (ref 0.0–0.2)

## 2021-02-26 LAB — SAMPLE TO BLOOD BANK

## 2021-03-01 ENCOUNTER — Ambulatory Visit: Payer: Self-pay | Admitting: General Surgery

## 2021-03-01 ENCOUNTER — Other Ambulatory Visit: Payer: Self-pay

## 2021-03-01 ENCOUNTER — Ambulatory Visit: Payer: PPO | Admitting: Internal Medicine

## 2021-03-01 ENCOUNTER — Inpatient Hospital Stay: Payer: PPO

## 2021-03-01 DIAGNOSIS — C180 Malignant neoplasm of cecum: Secondary | ICD-10-CM | POA: Diagnosis not present

## 2021-03-01 DIAGNOSIS — C182 Malignant neoplasm of ascending colon: Secondary | ICD-10-CM | POA: Diagnosis not present

## 2021-03-01 DIAGNOSIS — D649 Anemia, unspecified: Secondary | ICD-10-CM

## 2021-03-01 LAB — CBC WITH DIFFERENTIAL (CANCER CENTER ONLY)
Abs Immature Granulocytes: 0.02 10*3/uL (ref 0.00–0.07)
Basophils Absolute: 0.1 10*3/uL (ref 0.0–0.1)
Basophils Relative: 1 %
Eosinophils Absolute: 0.3 10*3/uL (ref 0.0–0.5)
Eosinophils Relative: 4 %
HCT: 27 % — ABNORMAL LOW (ref 39.0–52.0)
Hemoglobin: 8.2 g/dL — ABNORMAL LOW (ref 13.0–17.0)
Immature Granulocytes: 0 %
Lymphocytes Relative: 20 %
Lymphs Abs: 1.7 10*3/uL (ref 0.7–4.0)
MCH: 25.6 pg — ABNORMAL LOW (ref 26.0–34.0)
MCHC: 30.4 g/dL (ref 30.0–36.0)
MCV: 84.4 fL (ref 80.0–100.0)
Monocytes Absolute: 0.8 10*3/uL (ref 0.1–1.0)
Monocytes Relative: 10 %
Neutro Abs: 5.4 10*3/uL (ref 1.7–7.7)
Neutrophils Relative %: 65 %
Platelet Count: 295 10*3/uL (ref 150–400)
RBC: 3.2 MIL/uL — ABNORMAL LOW (ref 4.22–5.81)
RDW: 16.2 % — ABNORMAL HIGH (ref 11.5–15.5)
WBC Count: 8.2 10*3/uL (ref 4.0–10.5)
nRBC: 0 % (ref 0.0–0.2)

## 2021-03-01 LAB — SAMPLE TO BLOOD BANK

## 2021-03-01 NOTE — H&P (Signed)
History of Present Illness: Donald [name] is a 87 y.o. male who is seen today as an office consultation at the request of Dr. Bryan Lemma for evaluation of colon mass   87 year old male with a history of cardiomyopathy, coronary artery disease and recent anemia that was diagnosed due to shortness of breath and fatigue.  This was treated and then evaluated with colonoscopy and EGD.  This showed a cecal mass with biopsies showing adenocarcinoma.  Patient has a history of pacemaker placement and is on Eliquis due to atrial fibrillation.  He denies any past abdominal surgical history.  Patient reports recent history of diarrhea.  He denies any weight loss.   Review of Systems: A complete review of systems was obtained from the patient.  I have reviewed this information and discussed as appropriate with the patient.  See HPI as well for other ROS.       Medical History:        Past Medical History:  Diagnosis Date   Anemia     COPD (chronic obstructive pulmonary disease) (CMS-HCC)     GERD (gastroesophageal reflux disease)     History of cancer     Sleep apnea             Patient Active Problem List  Diagnosis   Allergic rhinitis due to pollen   Angiodysplasia of upper gastrointestinal tract   Coronary artery disease involving native coronary artery of native heart without angina pectoris   Atrial flutter (CMS-HCC)   Blood in stool   CHF (congestive heart failure) (CMS-HCC)   Chronic anticoagulation   COVID   Emphysema lung (CMS-HCC)   Esophageal stricture   Essential hypertension   Habitual alcohol use   Hearing loss   Hyperlipidemia with target LDL less than 100   ILD (interstitial lung disease) (CMS-HCC)   Iron deficiency anemia   Obesity (BMI 30.0-34.9), unspecified   OSA (obstructive sleep apnea)   Pacemaker   PAF (paroxysmal atrial fibrillation) (CMS-HCC)   Psychosexual dysfunction with inhibited sexual excitement   Renal cyst, left   Routine health maintenance    Tachycardia           Past Surgical History:  Procedure Laterality Date   HERNIA REPAIR       TONSILLECTOMY N/A        No Known Allergies              Current Outpatient Medications on File Prior to Visit  Medication Sig Dispense Refill   apixaban (ELIQUIS) 5 mg tablet Take 5 mg by mouth 2 (two) times daily       diltiazem (CARDIZEM CD) 120 MG XR capsule Take by mouth       dofetilide (TIKOSYN) 500 MCG capsule Take by mouth       ferrous sulfate 325 (65 FE) MG tablet Take 325 mg by mouth daily with breakfast       FUROsemide (LASIX) 20 MG tablet Take 1 tablet by mouth once daily       isosorbide mononitrate (IMDUR) 60 MG ER tablet Take by mouth       metoprolol tartrate (LOPRESSOR) 25 MG tablet Take 25 mg by mouth 2 (two) times daily       nitroGLYcerin (NITROSTAT) 0.4 MG SL tablet PLACE 1 TABLET UNDER THE TONGUE EVERY 5 MINUTES AS NEEDED FOR CHEST PAIN, MAY REPEAT FOR 3 DOSES.       rosuvastatin (CRESTOR) 10 MG tablet Take 1 tablet by mouth once daily  albuterol 90 mcg/actuation inhaler Inhale TWO puffs into THE lungs every SIX hours as needed FOR wheezing OR shortness of breath       Bifidobacterium infantis (ALIGN ORAL) Take by mouth       dextrin (FIBER POWDER ORAL) Take by mouth       magnesium chloride (MAG DELAY) 64 mg DR tablet Take 1 tablet by mouth once daily       metroNIDAZOLE (METROGEL) 0.75 % gel Apply topically       multivitamin with iron/minerals (THERADEX-M) 27-0.4 mg tablet Take 1 tablet by mouth 2 (two) times daily       multivitamin-minerals (OCUVITE) tablet Take 1 capsule by mouth 2 (two) times daily       pantoprazole (PROTONIX) 40 MG DR tablet TAKE ONE TABLET BY MOUTH TWICE DAILY FOR SIX WEEKS, THEN reduce back TO ONCE DAILY        No current facility-administered medications on file prior to visit.      History reviewed. No pertinent family history.    Social History         Tobacco Use  Smoking Status Former Smoker  Smokeless Tobacco Never  Used      Social History           Socioeconomic History   Marital status: Married  Tobacco Use   Smoking status: Former Smoker   Smokeless tobacco: Never Used  Scientific laboratory technician Use: Never used  Substance and Sexual Activity   Alcohol use: Yes   Drug use: Never      Objective:        Exam Gen: NAD CV: irregular rhythm  Lungs:CTA Abd: soft, nontender       Labs, Imaging and Diagnostic Testing: CEA: 1.5   CT CAP:  CT CHEST IMPRESSION: 1. The appearance of the lungs is stable compared to the prior study, with a spectrum of findings categorized as indeterminate for usual interstitial pneumonia (UIP) per current ATS guidelines. 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 2. Mild diffuse bronchial wall thickening with mild centrilobular and paraseptal emphysema; imaging findings suggestive of underlying COPD. 3. Cardiomegaly with left atrial dilatation. 4. Aortic atherosclerosis, in addition to left main and 3 vessel coronary artery disease. 5. There are severe calcifications of the aortic valve and mild calcifications of the mitral annulus. Echocardiographic correlation for evaluation of potential valvular dysfunction may be warranted if clinically indicated. CT ABD/PELVIS IMPRESSION: 1. Circumferential wall thickening involving the ascending colon at the level of the ileocecal valve is identified compatible with primary colonic neoplasm. Single borderline enlarged right lower quadrant ileocolic lymph node is identified. 2. Multiple liver cysts as well as several too small to reliably characterize (less than 1 cm) low-attenuation liver lesions. 3. Sigmoid diverticulosis without signs of acute diverticulitis. 4. Fat containing left inguinal hernia. 5. Aortic Atherosclerosis (ICD10-I70.0).   Assessment and Plan:  Diagnoses and all orders for this visit:   Malignant neoplasm of ascending  colon (CMS-HCC)   Mass of colon -     polyethylene glycol (MIRALAX) powder; Take 233.75 g by mouth once for 1 dose Take according to your procedure prep instructions. -     bisacodyL (DULCOLAX) 5 mg EC tablet; Take 4 tablets (20 mg total) by mouth once for 1 dose -     metroNIDAZOLE (FLAGYL) 500 MG tablet; Take 2 tablets (1,000 mg total) by mouth 3 (three) times daily for 3 doses Take according  to your procedure colon prep instructions -     neomycin 500 mg tablet; Take 2 tablets (1,000 mg total) by mouth 3 (three) times daily for 3 doses Take according to your procedure colon prep instructions     Biopsies show adenocarcinoma.  CEA levels are normal.  Patient will undergo CT scans next week.    Patient has undergone preoperative risk assessment with his cardiologist.  He is at high risk for surgery.  We discussed implications of this.  We discussed the risk of cardiac complications leading to death being approximately 5% or greater.  We discussed other risk of surgery.  According to the ACS risk calculator, he has a proximately 25% risk of serious complications after surgery.  His risk of anastomotic leak is 3%.  His risk of ileus is 20%.  His risk of death is approximately 10%.  His risk of readmission is 18%.  His risk of discharged to a rehab facility is greater than 40%.  We have discussed these risk in detail and although they are high, his risk of continued bleeding from his tumor is even higher and we have decided to proceed with surgery.  He will hold his Eliquis 2 days prior to surgery.  He is under the care of his hematologist and receiving transfusions as needed.  We will proceed with a robotic assisted partial colectomy.  All questions were answered.   The surgery and anatomy were described to the patient as well as the risks of surgery and the possible complications.  These include: Bleeding, deep abdominal infections and possible wound complications such as hernia and infection, damage to  adjacent structures, leak of surgical connections, which can lead to other surgeries and possibly an ostomy, possible need for other procedures, such as abscess drains in radiology, possible prolonged hospital stay, possible diarrhea from removal of part of the colon, possible constipation from narcotics, possible bowel, bladder or sexual dysfunction if having rectal surgery, prolonged fatigue/weakness or appetite loss, possible early recurrence of of disease, possible complications of their medical problems such as heart disease or arrhythmias or lung problems, death (less than 1%). I believe the patient understands and wishes to proceed with the surgery.

## 2021-03-01 NOTE — H&P (View-Only) (Signed)
History of Present Illness: Donald [name] is a 87 y.o. male who is seen today as an office consultation at the request of Dr. Bryan Lemma for evaluation of colon mass   87 year old male with a history of cardiomyopathy, coronary artery disease and recent anemia that was diagnosed due to shortness of breath and fatigue.  This was treated and then evaluated with colonoscopy and EGD.  This showed a cecal mass with biopsies showing adenocarcinoma.  Patient has a history of pacemaker placement and is on Eliquis due to atrial fibrillation.  He denies any past abdominal surgical history.  Patient reports recent history of diarrhea.  He denies any weight loss.   Review of Systems: A complete review of systems was obtained from the patient.  I have reviewed this information and discussed as appropriate with the patient.  See HPI as well for other ROS.       Medical History:        Past Medical History:  Diagnosis Date   Anemia     COPD (chronic obstructive pulmonary disease) (CMS-HCC)     GERD (gastroesophageal reflux disease)     History of cancer     Sleep apnea             Patient Active Problem List  Diagnosis   Allergic rhinitis due to pollen   Angiodysplasia of upper gastrointestinal tract   Coronary artery disease involving native coronary artery of native heart without angina pectoris   Atrial flutter (CMS-HCC)   Blood in stool   CHF (congestive heart failure) (CMS-HCC)   Chronic anticoagulation   COVID   Emphysema lung (CMS-HCC)   Esophageal stricture   Essential hypertension   Habitual alcohol use   Hearing loss   Hyperlipidemia with target LDL less than 100   ILD (interstitial lung disease) (CMS-HCC)   Iron deficiency anemia   Obesity (BMI 30.0-34.9), unspecified   OSA (obstructive sleep apnea)   Pacemaker   PAF (paroxysmal atrial fibrillation) (CMS-HCC)   Psychosexual dysfunction with inhibited sexual excitement   Renal cyst, left   Routine health maintenance    Tachycardia           Past Surgical History:  Procedure Laterality Date   HERNIA REPAIR       TONSILLECTOMY N/A        No Known Allergies              Current Outpatient Medications on File Prior to Visit  Medication Sig Dispense Refill   apixaban (ELIQUIS) 5 mg tablet Take 5 mg by mouth 2 (two) times daily       diltiazem (CARDIZEM CD) 120 MG XR capsule Take by mouth       dofetilide (TIKOSYN) 500 MCG capsule Take by mouth       ferrous sulfate 325 (65 FE) MG tablet Take 325 mg by mouth daily with breakfast       FUROsemide (LASIX) 20 MG tablet Take 1 tablet by mouth once daily       isosorbide mononitrate (IMDUR) 60 MG ER tablet Take by mouth       metoprolol tartrate (LOPRESSOR) 25 MG tablet Take 25 mg by mouth 2 (two) times daily       nitroGLYcerin (NITROSTAT) 0.4 MG SL tablet PLACE 1 TABLET UNDER THE TONGUE EVERY 5 MINUTES AS NEEDED FOR CHEST PAIN, MAY REPEAT FOR 3 DOSES.       rosuvastatin (CRESTOR) 10 MG tablet Take 1 tablet by mouth once daily  albuterol 90 mcg/actuation inhaler Inhale TWO puffs into THE lungs every SIX hours as needed FOR wheezing OR shortness of breath       Bifidobacterium infantis (ALIGN ORAL) Take by mouth       dextrin (FIBER POWDER ORAL) Take by mouth       magnesium chloride (MAG DELAY) 64 mg DR tablet Take 1 tablet by mouth once daily       metroNIDAZOLE (METROGEL) 0.75 % gel Apply topically       multivitamin with iron/minerals (THERADEX-M) 27-0.4 mg tablet Take 1 tablet by mouth 2 (two) times daily       multivitamin-minerals (OCUVITE) tablet Take 1 capsule by mouth 2 (two) times daily       pantoprazole (PROTONIX) 40 MG DR tablet TAKE ONE TABLET BY MOUTH TWICE DAILY FOR SIX WEEKS, THEN reduce back TO ONCE DAILY        No current facility-administered medications on file prior to visit.      History reviewed. No pertinent family history.    Social History         Tobacco Use  Smoking Status Former Smoker  Smokeless Tobacco Never  Used      Social History           Socioeconomic History   Marital status: Married  Tobacco Use   Smoking status: Former Smoker   Smokeless tobacco: Never Used  Scientific laboratory technician Use: Never used  Substance and Sexual Activity   Alcohol use: Yes   Drug use: Never      Objective:        Exam Gen: NAD CV: irregular rhythm  Lungs:CTA Abd: soft, nontender       Labs, Imaging and Diagnostic Testing: CEA: 1.5   CT CAP:  CT CHEST IMPRESSION: 1. The appearance of the lungs is stable compared to the prior study, with a spectrum of findings categorized as indeterminate for usual interstitial pneumonia (UIP) per current ATS guidelines. 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 2. Mild diffuse bronchial wall thickening with mild centrilobular and paraseptal emphysema; imaging findings suggestive of underlying COPD. 3. Cardiomegaly with left atrial dilatation. 4. Aortic atherosclerosis, in addition to left main and 3 vessel coronary artery disease. 5. There are severe calcifications of the aortic valve and mild calcifications of the mitral annulus. Echocardiographic correlation for evaluation of potential valvular dysfunction may be warranted if clinically indicated. CT ABD/PELVIS IMPRESSION: 1. Circumferential wall thickening involving the ascending colon at the level of the ileocecal valve is identified compatible with primary colonic neoplasm. Single borderline enlarged right lower quadrant ileocolic lymph node is identified. 2. Multiple liver cysts as well as several too small to reliably characterize (less than 1 cm) low-attenuation liver lesions. 3. Sigmoid diverticulosis without signs of acute diverticulitis. 4. Fat containing left inguinal hernia. 5. Aortic Atherosclerosis (ICD10-I70.0).   Assessment and Plan:  Diagnoses and all orders for this visit:   Malignant neoplasm of ascending  colon (CMS-HCC)   Mass of colon -     polyethylene glycol (MIRALAX) powder; Take 233.75 g by mouth once for 1 dose Take according to your procedure prep instructions. -     bisacodyL (DULCOLAX) 5 mg EC tablet; Take 4 tablets (20 mg total) by mouth once for 1 dose -     metroNIDAZOLE (FLAGYL) 500 MG tablet; Take 2 tablets (1,000 mg total) by mouth 3 (three) times daily for 3 doses Take according  to your procedure colon prep instructions -     neomycin 500 mg tablet; Take 2 tablets (1,000 mg total) by mouth 3 (three) times daily for 3 doses Take according to your procedure colon prep instructions     Biopsies show adenocarcinoma.  CEA levels are normal.  Patient will undergo CT scans next week.    Patient has undergone preoperative risk assessment with his cardiologist.  He is at high risk for surgery.  We discussed implications of this.  We discussed the risk of cardiac complications leading to death being approximately 5% or greater.  We discussed other risk of surgery.  According to the ACS risk calculator, he has a proximately 25% risk of serious complications after surgery.  His risk of anastomotic leak is 3%.  His risk of ileus is 20%.  His risk of death is approximately 10%.  His risk of readmission is 18%.  His risk of discharged to a rehab facility is greater than 40%.  We have discussed these risk in detail and although they are high, his risk of continued bleeding from his tumor is even higher and we have decided to proceed with surgery.  He will hold his Eliquis 2 days prior to surgery.  He is under the care of his hematologist and receiving transfusions as needed.  We will proceed with a robotic assisted partial colectomy.  All questions were answered.   The surgery and anatomy were described to the patient as well as the risks of surgery and the possible complications.  These include: Bleeding, deep abdominal infections and possible wound complications such as hernia and infection, damage to  adjacent structures, leak of surgical connections, which can lead to other surgeries and possibly an ostomy, possible need for other procedures, such as abscess drains in radiology, possible prolonged hospital stay, possible diarrhea from removal of part of the colon, possible constipation from narcotics, possible bowel, bladder or sexual dysfunction if having rectal surgery, prolonged fatigue/weakness or appetite loss, possible early recurrence of of disease, possible complications of their medical problems such as heart disease or arrhythmias or lung problems, death (less than 1%). I believe the patient understands and wishes to proceed with the surgery.

## 2021-03-02 ENCOUNTER — Inpatient Hospital Stay: Payer: PPO

## 2021-03-02 VITALS — BP 125/71 | HR 62 | Temp 98.1°F | Resp 18

## 2021-03-02 DIAGNOSIS — D649 Anemia, unspecified: Secondary | ICD-10-CM

## 2021-03-02 DIAGNOSIS — C180 Malignant neoplasm of cecum: Secondary | ICD-10-CM | POA: Diagnosis not present

## 2021-03-02 LAB — PREPARE RBC (CROSSMATCH)

## 2021-03-02 MED ORDER — HEPARIN SOD (PORK) LOCK FLUSH 100 UNIT/ML IV SOLN: 500.0000 [IU] | Freq: Every day | INTRAVENOUS | Status: DC | PRN

## 2021-03-02 MED ORDER — SODIUM CHLORIDE 0.9% FLUSH: 10.0000 mL | INTRAVENOUS | Status: DC | PRN

## 2021-03-02 MED ORDER — SODIUM CHLORIDE 0.9% IV SOLUTION
250.0000 mL | Freq: Once | INTRAVENOUS | Status: AC
  Administered 2021-03-02: 250 mL via INTRAVENOUS

## 2021-03-03 ENCOUNTER — Other Ambulatory Visit: Payer: Self-pay

## 2021-03-03 LAB — TYPE AND SCREEN
ABO/RH(D): O NEG
Antibody Screen: NEGATIVE
Unit division: 0

## 2021-03-03 LAB — BPAM RBC
Blood Product Expiration Date: 202211162359
ISSUE DATE / TIME: 202210180844
Unit Type and Rh: 9500

## 2021-03-03 NOTE — Progress Notes (Signed)
The proposed treatment discussed in conference is for discussion purpose only and is not a binding recommendation.  The patients have not been physically examined, or presented with their treatment options.  Therefore, final treatment plans cannot be decided.  

## 2021-03-04 ENCOUNTER — Encounter: Payer: Self-pay | Admitting: Internal Medicine

## 2021-03-04 NOTE — Patient Instructions (Addendum)
DUE TO COVID-19 ONLY ONE VISITOR IS ALLOWED TO COME WITH YOU AND STAY IN THE WAITING ROOM ONLY DURING PRE OP AND PROCEDURE DAY OF SURGERY IF YOU ARE GOING HOME AFTER SURGERY. IF YOU ARE SPENDING THE NIGHT 2 PEOPLE MAY VISIT WITH YOU IN YOUR PRIVATE ROOM AFTER SURGERY UNTIL VISITING  HOURS ARE OVER AT 800 PM AND THE 21VISITOR CAN SPEND THE NIGHT.   YOU NEED TO HAVE A COVID 19 TEST ON_11/7/22___THIS TEST MUST BE DONE BEFORE SURGERY,  COVID TESTING SITE  IS LOCATED AT [place]. REMAIN IN YOUR CAR THIS IS A DRIVE UP TEST. AFTER YOUR COVID TEST PLEASE WEAR A MASK OUT IN PUBLIC AND SOCIAL DISTANCE AND [place] YOUR HANDS FREQUENTLY, ALSO ASK ALL YOUR CLOSE CONTACT PERSONS TO WEAR A MASK AND SOCIAL DISTANCE AND [place] THEIR HANDS FREQUENTLY ALSO.               Donald [name]     Your procedure is scheduled on: 03/24/21   Report to [facility] Main  Entrance   Report to admitting at  6:16 AM     Call this number if you have problems the morning of surgery [phone]    Follow all instructions for the bowel prep from the Dr.'s office.   Drink plenty of fluids on bowel prep to prevent dehydration  DRINK 2 PRESURGERY ENSURE DRINKS THE NIGHT BEFORE SURGERY AT 10:00 PM .   . NO SOLIDS AFTER MIDNIGHT THE DAY PRIOR TO THE SURGERY.   NOTHING BY MOUTH EXCEPT CLEAR LIQUIDS UNTIL  5:30 am   PLEASE FINISH PRESURGERY ENSURE DRINK PER SURGEON ORDER 3 HOURS PRIOR TO SCHEDULED SURGERY TIME WHICH NEEDS TO BE COMPLETED AT __5:30 am_______.   CLEAR LIQUID DIET   Foods Allowed                                                                     Foods Excluded                                                                                      liquids that you cannot  Plain Jell-O any favor except red or purple                                           see through such as: Fruit ices (not with fruit pulp)                                     milk, soups, orange juice  Iced  Popsicles  All solid food Carbonated beverages, regular and diet                                    Cranberry, grape and apple juices Sports drinks like Gatorade Lightly seasoned clear broth or consume(fat free) Sugar     BRUSH YOUR TEETH MORNING OF SURGERY AND RINSE YOUR MOUTH OUT, NO CHEWING GUM CANDY OR MINTS.   Stop taking _Eliquis__________on _   last dose11/6/22________as instructed by __Dr. Kline___________.      Take these medicines the morning of surgery with A SIP OF WATER: Isosorbide, Defetilide,Metoprolol, Pantoprazole,   Use your inhaler and bring it with you.  Bring your mask and tubing                                You may not have any metal on your body including              piercings  Do not wear jewelry,  lotions, powders or  deodorant                          Men may shave face and neck.   Do not bring valuables to the hospital. [place].  Contacts, dentures or bridgework may not be worn into surgery.  Leave suitcase in the car. After surgery it may be brought to your room.                  - Preparing for Surgery Before surgery, you can play an important role.  Because skin is not sterile, your skin needs to be as free of germs as possible.  You can reduce the number of germs on your skin by washing with CHG (chlorahexidine gluconate) soap before surgery.  CHG is an antiseptic cleaner which kills germs and bonds with the skin to continue killing germs even after washing. Please DO NOT use if you have an allergy to CHG or antibacterial soaps.  If your skin becomes reddened/irritated stop using the CHG and inform your nurse when you arrive at Short Stay.  You may shave your face/neck. Please follow these instructions carefully:  1.  Shower with CHG Soap the night before surgery and the  morning of Surgery.  2.  If you choose to wash your hair, wash your hair  first as usual with your  normal  shampoo.  3.  After you shampoo, rinse your hair and body thoroughly to remove the  shampoo.                            4.  Use CHG as you would any other liquid soap.  You can apply chg directly  to the skin and wash                       Gently with a scrungie or clean washcloth.  5.  Apply the CHG Soap to your body ONLY FROM THE NECK DOWN.   Do not use on face/ open  Wound or open sores. Avoid contact with eyes, ears mouth and genitals (private parts).                       Wash face,  Genitals (private parts) with your normal soap.             6.  Wash thoroughly, paying special attention to the area where your surgery  will be performed.  7.  Thoroughly rinse your body with warm water from the neck down.  8.  DO NOT shower/wash with your normal soap after using and rinsing off  the CHG Soap.                9.  Pat yourself dry with a clean towel.            10.  Wear clean pajamas.            11.  Place clean sheets on your bed the night of your first shower and do not  sleep with pets. Day of Surgery : Do not apply any lotions/deodorants the morning of surgery.  Please wear clean clothes to the hospital/surgery center.  FAILURE TO FOLLOW THESE INSTRUCTIONS MAY RESULT IN THE CANCELLATION OF YOUR SURGERY PATIENT SIGNATURE_________________________________  NURSE SIGNATURE__________________________________  ________________________________________________________________________   Donald [name]  An incentive spirometer is a tool that can help keep your lungs clear and active. This tool measures how well you are filling your lungs with each breath. Taking long deep breaths may help reverse or decrease the chance of developing breathing (pulmonary) problems (especially infection) following: A long period of time when you are unable to move or be active. BEFORE THE PROCEDURE  If the spirometer includes an indicator to show  your best effort, your nurse or respiratory therapist will set it to a desired goal. If possible, sit up straight or lean slightly forward. Try not to slouch. Hold the incentive spirometer in an upright position. INSTRUCTIONS FOR USE  Sit on the edge of your bed if possible, or sit up as far as you can in bed or on a chair. Hold the incentive spirometer in an upright position. Breathe out normally. Place the mouthpiece in your mouth and seal your lips tightly around it. Breathe in slowly and as deeply as possible, raising the piston or the ball toward the top of the column. Hold your breath for 3-5 seconds or for as long as possible. Allow the piston or ball to fall to the bottom of the column. Remove the mouthpiece from your mouth and breathe out normally. Rest for a few seconds and repeat Steps 1 through 7 at least 10 times every 1-2 hours when you are awake. Take your time and take a few normal breaths between deep breaths. The spirometer may include an indicator to show your best effort. Use the indicator as a goal to work toward during each repetition. After each set of 10 deep breaths, practice coughing to be sure your lungs are clear. If you have an incision (the cut made at the time of surgery), support your incision when coughing by placing a pillow or rolled up towels firmly against it. Once you are able to get out of bed, walk around indoors and cough well. You may stop using the incentive spirometer when instructed by your caregiver.  RISKS AND COMPLICATIONS Take your time so you do not get dizzy or light-headed. If you are in pain, you may need to take or ask  for pain medication before doing incentive spirometry. It is harder to take a deep breath if you are having pain. AFTER USE Rest and breathe slowly and easily. It can be helpful to keep track of a log of your progress. Your caregiver can provide you with a simple table to help with this. If you are using the spirometer at home,  follow these instructions: [place] IF:  You are having difficultly using the spirometer. You have trouble using the spirometer as often as instructed. Your pain medication is not giving enough relief while using the spirometer. You develop fever of 100.5 F (38.1 C) or higher. SEEK IMMEDIATE MEDICAL CARE IF:  You cough up bloody sputum that had not been present before. You develop fever of 102 F (38.9 C) or greater. You develop worsening pain at or near the incision site. MAKE SURE YOU:  Understand these instructions. Will watch your condition. Will get help right away if you are not doing well or get worse. Document Released: 09/12/2006 Document Revised: 07/25/2011 Document Reviewed: 11/13/2006 [facility] Patient Information 2014 [place], [place].   ________________________________________________________________________

## 2021-03-04 NOTE — Progress Notes (Unsigned)
PERIOPERATIVE PRESCRIPTION FOR IMPLANTED CARDIAC DEVICE PROGRAMMING  Patient Information: Name:  Donald [name]  DOB:  07/23/1932  MRN:  [id number]  {TIP - You do not have to delete this tip  -  Copy the info from the staff message sent by the PAT staff  then press F2 here and paste the information using CTL - V on the next line :[id number]}  Palen, Herbie Saxon, RN  P Cv Div Heartcare Device Planned Procedure:  Partial Colectomy  Surgeon:  Dr. Leighton Ruff  Date of Procedure:  03/24/21  Cautery will be used.  Position during surgery:    Device Information:  Clinic EP Physician:  Virl Axe, MD   Device Type:  Pacemaker Manufacturer and Phone #:  St. Jude/[employer]ott: [phone] Pacemaker Dependent?:  No. Date of Last Device Check:  02/12/21 Normal Device Function?:  Yes.    Electrophysiologist's Recommendations:  Have magnet available. Provide continuous ECG monitoring when magnet is used or reprogramming is to be performed.  Procedure should not interfere with device function.  No device programming or magnet placement needed.  Per Device Clinic Standing Orders, Wanda Plump, RN  12:18 PM 03/04/2021

## 2021-03-05 ENCOUNTER — Encounter: Payer: Self-pay | Admitting: Internal Medicine

## 2021-03-08 ENCOUNTER — Encounter (HOSPITAL_COMMUNITY)
Admission: RE | Admit: 2021-03-08 | Discharge: 2021-03-08 | Disposition: A | Discharge status: Home / Self Care | Payer: PPO | Source: Ambulatory Visit | Attending: General Surgery | Admitting: General Surgery

## 2021-03-08 ENCOUNTER — Other Ambulatory Visit: Payer: Self-pay

## 2021-03-08 ENCOUNTER — Encounter (HOSPITAL_COMMUNITY): Payer: Self-pay

## 2021-03-08 VITALS — BP 114/69 | HR 63 | Temp 97.9°F | Resp 18 | Ht 69.0 in | Wt 218.0 lb

## 2021-03-08 DIAGNOSIS — E119 Type 2 diabetes mellitus without complications: Secondary | ICD-10-CM | POA: Insufficient documentation

## 2021-03-08 DIAGNOSIS — K921 Melena: Secondary | ICD-10-CM | POA: Diagnosis not present

## 2021-03-08 DIAGNOSIS — Z01818 Encounter for other preprocedural examination: Secondary | ICD-10-CM | POA: Insufficient documentation

## 2021-03-08 HISTORY — DX: Presence of cardiac pacemaker: Z95.0

## 2021-03-08 LAB — CBC
HCT: 29.7 % — ABNORMAL LOW (ref 39.0–52.0)
Hemoglobin: 9 g/dL — ABNORMAL LOW (ref 13.0–17.0)
MCH: 26.6 pg (ref 26.0–34.0)
MCHC: 30.3 g/dL (ref 30.0–36.0)
MCV: 87.9 fL (ref 80.0–100.0)
Platelets: 270 10*3/uL (ref 150–400)
RBC: 3.38 MIL/uL — ABNORMAL LOW (ref 4.22–5.81)
RDW: 17 % — ABNORMAL HIGH (ref 11.5–15.5)
WBC: 7.2 10*3/uL (ref 4.0–10.5)
nRBC: 0 % (ref 0.0–0.2)

## 2021-03-08 LAB — GLUCOSE, CAPILLARY: Glucose-Capillary: 100 mg/dL — ABNORMAL HIGH (ref 70–99)

## 2021-03-08 LAB — BASIC METABOLIC PANEL
Anion gap: 8 (ref 5–15)
BUN: 24 mg/dL — ABNORMAL HIGH (ref 8–23)
CO2: 23 mmol/L (ref 22–32)
Calcium: 8.6 mg/dL — ABNORMAL LOW (ref 8.9–10.3)
Chloride: 107 mmol/L (ref 98–111)
Creatinine, Ser: 1.07 mg/dL (ref 0.61–1.24)
GFR, Estimated: >60 mL/min (ref >60)
Glucose, Bld: 100 mg/dL — ABNORMAL HIGH (ref 70–99)
Potassium: 4.2 mmol/L (ref 3.5–5.1)
Sodium: 138 mmol/L (ref 135–145)

## 2021-03-08 LAB — HEMOGLOBIN A1C
Hgb A1c MFr Bld: 5.5 % (ref 4.8–5.6)
Mean Plasma Glucose: 111.15 mg/dL

## 2021-03-08 NOTE — Progress Notes (Signed)
COVID test- not needed 90 day policy Pt had Covid 3/37/44. Documentation on chart  PCP - Dr. Jenelle Mages Pulmonary- Dr. Fausto Skillern LOV 11/11/20 Cardiologist - Dr. Danie Chandler  Chest x-ray -  EKG - 02/12/21-epic Stress Test -  ECHO - 02/19/21-epic Cardiac Cath - 03/16/17-epic Pacemaker/ICD device last checked:02/16/21 orders on chart  Sleep Study - yes CPAP - yes  Fasting Blood Sugar - NA Checks Blood Sugar _____ times a day  Blood Thinner Instructions:Eliquis /Dr. Jens Som Aspirin Instructions:Hold 2 days prior to DOS/Pharm Last Dose:03/21/21  Anesthesia review:   Patient denies shortness of breath, fever, cough and chest pain at PAT appointment Pt has been SOB with anemia. Last blood transfusion was 2 weeks ago.  Patient verbalized understanding of instructions that were given to them at the PAT appointment. Patient was also instructed that they will need to review over the PAT instructions again at home before surgery. yes

## 2021-03-09 DIAGNOSIS — G4733 Obstructive sleep apnea (adult) (pediatric): Secondary | ICD-10-CM | POA: Diagnosis not present

## 2021-03-11 DIAGNOSIS — G4733 Obstructive sleep apnea (adult) (pediatric): Secondary | ICD-10-CM | POA: Diagnosis not present

## 2021-03-11 NOTE — Progress Notes (Signed)
Anesthesia Chart Review   Case: [id number] Date/Time: 03/24/21 0815   Procedure: XI ROBOT ASSISTED LAPAROSCOPIC PARTIAL COLECTOMY   Anesthesia type: General   Pre-op diagnosis: COLON CANCER   Location: [facility] ROOM 02 / [facility]   Surgeons: Leighton Ruff, MD       [id number] y.o. former smoker with h/o HTN, OSA on CPAP, GERD, PAF (on Tikosyn, on Eliquis), CHF, sick sinus syndrome s/p st. Jude PPM (device orders in 03/04/2021 progress note), emphysema, colon cancer scheduled for above procedure 19/08/1738 with Dr. Leighton Ruff.   Pt last seen by pulmonology 02/17/2021. Per OV note, "No contraindication for planned colectomy."   Pt seen by cardiology 02/12/2021 for preoperative evaluation.  Per OV note, "Echo 05/2018 LVEF 60-65% Per office protocol he made hold Eliquis for 2 days prior to procedure if decision is made to proceed From a Cardiac Perspective, he is at a high risk. Discussed with Dr. Caryl Comes. If Echo is stable from previous, would not plan on Cath for chronic, stable angina.  Pt understands that given his history he is at a higher risk than average of perioperative complications from a cardiac perspective Clearance recommend pending Echo."  Echo 02/19/2021 with EF 60-65%, moderate aortic regurgitation, no aortic stenosis.   Per results, "Given normal EF and no WMA, per discussion with Dr. Caryl Comes patient would be eligible to proceed with abdominal surgery at [facility] if he remains so inclined."  Anticipate pt can proceed with planned procedure barring acute status change.   VS: BP 114/69   Pulse 63   Temp 36.6 C (Oral)   Resp 18   Ht 5\' 9"  (1.753 m)   Wt 98.9 kg   SpO2 99%   BMI 32.19 kg/m   PROVIDERS: Virgie Dad, MD is PCP   Virl Axe, MD is Cardiologist  LABS: Labs reviewed: Acceptable for surgery. (all labs ordered are listed, but only abnormal results are displayed)  Labs Reviewed  BASIC METABOLIC PANEL - Abnormal; Notable for the following components:       Result Value   Glucose, Bld 100 (*)    BUN 24 (*)    Calcium 8.6 (*)    All other components within normal limits  CBC - Abnormal; Notable for the following components:   RBC 3.38 (*)    Hemoglobin 9.0 (*)    HCT 29.7 (*)    RDW 17.0 (*)    All other components within normal limits  GLUCOSE, CAPILLARY - Abnormal; Notable for the following components:   Glucose-Capillary 100 (*)    All other components within normal limits  HEMOGLOBIN A1C     IMAGES:   EKG: 02/12/2021 Rate 60 bpm  NSR  CV: Echo 02/19/2021 1. Left ventricular ejection fraction, by estimation, is 60 to 65%. The  left ventricle has normal function. The left ventricle has no regional  wall motion abnormalities. Left ventricular diastolic parameters are  consistent with Grade I diastolic  dysfunction (impaired relaxation).   2. Right ventricular systolic function is normal. The right ventricular  size is moderately enlarged.   3. Left atrial size was mildly dilated.   4. Right atrial size was mildly dilated.   5. The mitral valve is normal in structure. Moderate mitral valve  regurgitation. No evidence of mitral stenosis.   6. The aortic valve is tricuspid. Aortic valve regurgitation is moderate.  Mild to moderate aortic valve sclerosis/calcification is present, without  any evidence of aortic stenosis. Aortic regurgitation PHT measures 431  msec.  7. There is borderline dilatation of the ascending aorta, measuring 36  mm.   8. The inferior vena cava is normal in size with greater than 50%  respiratory variability, suggesting right atrial pressure of 3 mmHg.   Cardiac Cath 03/16/2017 Prox Cx lesion, 50 %stenosed. Mid LAD lesion, 50 %stenosed. Mid RCA lesion, 20 %stenosed. Dist RCA lesion, 30 %stenosed. The left ventricular systolic function is normal. LV end diastolic pressure is normal. The left ventricular ejection fraction is 55-65% by visual estimate. There is no mitral valve regurgitation.    1. Moderate non-obstructive CAD 2. Normal LV systolic function 3. Normal LV filling pressures   Recommendations: Medical management of CAD. Aggressive risk factor reduction.  Past Medical History:  Diagnosis Date   Abnormal CT scan, stomach 02/2014   Thickening of gastric fundus and cardia.  gastritis on EGD 03/2014   Allergic rhinitis due to pollen    Anemia 12/2020   Arthropathy, unspecified, site unspecified    Atherosclerosis    a. Noted by abdominal CT 02/2014 (h/o normal nuc 2011).   Atrial flutter ([place]) 02/24/2014   s/p ablation 11/15   Cardiomyopathy ([place])    tachy mediated - a. TEE (10/15):  EF 30%;  b. Echo after NSR restored (10/15):  mild LVH, EF 55-60%, mild AS, mild AI, mild MR, mild to mod LAE, mild RAE   CHF (congestive heart failure) ([place]) 01/04/2021   COVID-19 11/2020   Diverticulosis    severe in descending, sigmoid colon.    ED (erectile dysfunction)    Emphysema of lung ([place])    Esophageal stricture 03/2014   traversable with endoscope. not dilated.    Gastric AVM    GERD (gastroesophageal reflux disease) 03/2014   small HH and gastritis on EGD   GI bleed    Habitual alcohol use    Hemorrhoids    Hiatal hernia    Hyperlipidemia    Hypertension    Obesity    OSA on CPAP    PAF (paroxysmal atrial fibrillation) ([place])    Pneumonia 12/2020   mild with covid   Presence of permanent cardiac pacemaker    Prostatitis, unspecified    Pulmonary fibrosis (HCC)    Sinus bradycardia    a. s/p STJ dual chamber PPM   Skin cancer    basal and squamous cell   Sleep apnea     Past Surgical History:  Procedure Laterality Date   ATRIAL FLUTTER ABLATION N/A 03/19/2014   RFCA of atrial flutter by Dr Caryl Comes   CARDIOVERSION N/A 02/24/2014   Procedure: CARDIOVERSION;  Surgeon: Lelon Perla, MD;  Location: [place];  Service: Cardiovascular;  Laterality: N/A;   CARDIOVERSION Right 09/10/2014   Procedure: CARDIOVERSION;  Surgeon: Deboraha Sprang, MD;   Location: [facility] CATH LAB;  Service: Cardiovascular;  Laterality: Right;   CARDIOVERSION N/A 01/22/2018   Procedure: CARDIOVERSION;  Surgeon: Fay Records, MD;  Location: [facility] ENDOSCOPY;  Service: Cardiovascular;  Laterality: N/A;   COLONOSCOPY N/A 04/23/2015   Procedure: COLONOSCOPY;  Surgeon: Ladene Artist, MD;  Location: [facility] ENDOSCOPY;  Service: Endoscopy;  Laterality: N/A;   COLONOSCOPY     ENTEROSCOPY N/A 06/12/2015   Procedure: ENTEROSCOPY;  Surgeon: Ladene Artist, MD;  [facility];  Service: Endoscopy;  Laterality: N/A;   EP IMPLANTABLE DEVICE N/A 02/15/2016   Procedure: Pacemaker Implant;  Surgeon: Evans Lance, MD;  Location: [place] CV LAB;  Service: Cardiovascular;  Laterality: N/A;   ESOPHAGOGASTRODUODENOSCOPY N/A 04/09/2014  Procedure: ESOPHAGOGASTRODUODENOSCOPY (EGD);  Surgeon: Inda Castle, MD;  Location: [place];  Service: Endoscopy;  Laterality: N/A;   ESOPHAGOGASTRODUODENOSCOPY N/A 04/23/2015   Procedure: ESOPHAGOGASTRODUODENOSCOPY (EGD);  Surgeon: Ladene Artist, MD;  Location: [facility] ENDOSCOPY;  Service: Endoscopy;  Laterality: N/A;   INGUINAL HERNIA REPAIR     right   INGUINAL HERNIA REPAIR     left   LEFT HEART CATH AND CORONARY ANGIOGRAPHY N/A 03/16/2017   Procedure: LEFT HEART CATH AND CORONARY ANGIOGRAPHY;  Surgeon: Burnell Blanks, MD;  Location: [place] CV LAB;  Service: Cardiovascular;  Laterality: N/A;   ORIF fracture of the elbow  1992   SKIN CANCER EXCISION  05/21/2012   Squamous cell ca   TEE WITHOUT CARDIOVERSION N/A 02/24/2014   Procedure: TRANSESOPHAGEAL ECHOCARDIOGRAM (TEE);  Surgeon: Lelon Perla, MD;  Location: [facility] ENDOSCOPY;  Service: Cardiovascular;  Laterality: N/A;   TONSILLECTOMY     UPPER GASTROINTESTINAL ENDOSCOPY      MEDICATIONS:  Pedialyte (PEDIALYTE) SOLN   acetaminophen (TYLENOL) 500 MG tablet   albuterol (VENTOLIN HFA) 108 (90 Base) MCG/ACT inhaler   carboxymethylcellulose (REFRESH TEARS) 0.5 %  SOLN   diltiazem (CARDIZEM CD) 120 MG 24 hr capsule   dofetilide (TIKOSYN) 500 MCG capsule   ELIQUIS 5 MG TABS tablet   ferrous sulfate 325 (65 FE) MG tablet   furosemide (LASIX) 20 MG tablet   isosorbide mononitrate (IMDUR) 60 MG 24 hr tablet   magnesium chloride (SLOW-MAG) 64 MG TBEC SR tablet   metoprolol tartrate (LOPRESSOR) 25 MG tablet   Multiple Vitamins-Minerals (ADULT GUMMY) CHEW   Multiple Vitamins-Minerals (OCUVITE PO)   nitroGLYCERIN (NITROSTAT) 0.4 MG SL tablet   pantoprazole (PROTONIX) 40 MG tablet   Probiotic Product (ALIGN PO)   Propylhexedrine (BENZEDREX) INHA   rosuvastatin (CRESTOR) 10 MG tablet   sodium chloride (OCEAN) 0.65 % SOLN nasal spray   Wheat Dextrin (BENEFIBER) POWD   No current facility-administered medications for this encounter.     Konrad Felix Ward, PA-C [facility] Pre-Surgical Testing [phone]

## 2021-03-11 NOTE — Anesthesia Preprocedure Evaluation (Addendum)
Anesthesia Evaluation  Patient identified by MRN, date of birth, ID band Patient awake    Reviewed: Allergy & Precautions, NPO status , Patient's Chart, lab work & pertinent test results  Airway Mallampati: III  TM Distance: >3 FB Neck ROM: Full    Dental no notable dental hx.    Pulmonary sleep apnea and Continuous Positive Airway Pressure Ventilation , COPD,  COPD inhaler, Patient abstained from smoking., former smoker,    Pulmonary exam normal breath sounds clear to auscultation       Cardiovascular hypertension, Pt. on medications and Pt. on home beta blockers + CAD and +CHF  Normal cardiovascular exam+ dysrhythmias Atrial Fibrillation + pacemaker  Rhythm:Regular Rate:Normal  ECHO: Left ventricular ejection fraction, by estimation, is 60 to 65%. The left ventricle has normal function. The left ventricle has no regional wall motion abnormalities. Left ventricular diastolic parameters are consistent with Grade I diastolic dysfunction (impaired relaxation). Right ventricular systolic function is normal. The right ventricular size is moderately enlarged. Left atrial size was mildly dilated. Right atrial size was mildly dilated. The mitral valve is normal in structure. Moderate mitral valve regurgitation. No evidence of mitral stenosis. The aortic valve is tricuspid. Aortic valve regurgitation is moderate. Mild to moderate aortic valve sclerosis/calcification is present, without any evidence of aortic stenosis. Aortic regurgitation PHT measures 431 msec. There is borderline dilatation of the ascending aorta, measuring 36 mm. The inferior vena cava is normal in size with greater than 50% respiratory variability, suggesting right atrial pressure of 3 mmHg.   Neuro/Psych negative neurological ROS  negative psych ROS   GI/Hepatic hiatal hernia, GERD  Medicated and Controlled,(+)     substance abuse  ,   Endo/Other  negative  endocrine ROS  Renal/GU negative Renal ROS     Musculoskeletal negative musculoskeletal ROS (+)   Abdominal (+) + obese,   Peds  Hematology  (+) anemia , HLD   Anesthesia Other Findings COLON CANCER  Reproductive/Obstetrics                           Anesthesia Physical Anesthesia Plan  ASA: 3  Anesthesia Plan: General   Post-op Pain Management:    Induction: Intravenous  PONV Risk Score and Plan: 3 and Ondansetron, Dexamethasone and Treatment may vary due to age or medical condition  Airway Management Planned: Oral ETT  Additional Equipment:   Intra-op Plan:   Post-operative Plan: Extubation in OR  Informed Consent: I have reviewed the patients History and Physical, chart, labs and discussed the procedure including the risks, benefits and alternatives for the proposed anesthesia with the patient or authorized representative who has indicated his/her understanding and acceptance.     Dental advisory given  Plan Discussed with: CRNA and Surgeon  Anesthesia Plan Comments: (Reviewed PAT note 03/08/2021, Konrad Felix Ward, PA-C  Clear Sight and potential arterial line discussed )      Anesthesia Quick Evaluation

## 2021-03-24 ENCOUNTER — Inpatient Hospital Stay (HOSPITAL_COMMUNITY): Payer: PPO | Admitting: Physician Assistant

## 2021-03-24 ENCOUNTER — Inpatient Hospital Stay (HOSPITAL_COMMUNITY)
Admission: RE | Admit: 2021-03-24 | Discharge: 2021-03-28 | DRG: 330 | Disposition: A | Discharge status: Home / Self Care | Payer: PPO | Source: Ambulatory Visit | Attending: General Surgery | Admitting: General Surgery

## 2021-03-24 ENCOUNTER — Other Ambulatory Visit: Payer: Self-pay

## 2021-03-24 ENCOUNTER — Ambulatory Visit: Payer: PPO | Admitting: Oncology

## 2021-03-24 ENCOUNTER — Inpatient Hospital Stay (HOSPITAL_COMMUNITY): Payer: PPO | Admitting: Anesthesiology

## 2021-03-24 ENCOUNTER — Encounter (HOSPITAL_COMMUNITY): Payer: Self-pay | Admitting: General Surgery

## 2021-03-24 ENCOUNTER — Encounter (HOSPITAL_COMMUNITY)
Admission: RE | Disposition: A | Discharge status: Home / Self Care | Payer: Self-pay | Source: Ambulatory Visit | Attending: General Surgery

## 2021-03-24 DIAGNOSIS — Z8042 Family history of malignant neoplasm of prostate: Secondary | ICD-10-CM | POA: Diagnosis not present

## 2021-03-24 DIAGNOSIS — D120 Benign neoplasm of cecum: Secondary | ICD-10-CM | POA: Diagnosis not present

## 2021-03-24 DIAGNOSIS — C779 Secondary and unspecified malignant neoplasm of lymph node, unspecified: Secondary | ICD-10-CM | POA: Diagnosis not present

## 2021-03-24 DIAGNOSIS — C180 Malignant neoplasm of cecum: Secondary | ICD-10-CM | POA: Diagnosis not present

## 2021-03-24 DIAGNOSIS — C182 Malignant neoplasm of ascending colon: Principal | ICD-10-CM | POA: Diagnosis present

## 2021-03-24 DIAGNOSIS — D509 Iron deficiency anemia, unspecified: Secondary | ICD-10-CM | POA: Diagnosis present

## 2021-03-24 DIAGNOSIS — K219 Gastro-esophageal reflux disease without esophagitis: Secondary | ICD-10-CM | POA: Diagnosis present

## 2021-03-24 DIAGNOSIS — C772 Secondary and unspecified malignant neoplasm of intra-abdominal lymph nodes: Secondary | ICD-10-CM | POA: Diagnosis present

## 2021-03-24 DIAGNOSIS — Z20822 Contact with and (suspected) exposure to covid-19: Secondary | ICD-10-CM | POA: Diagnosis present

## 2021-03-24 DIAGNOSIS — Z950 Presence of cardiac pacemaker: Secondary | ICD-10-CM | POA: Diagnosis not present

## 2021-03-24 DIAGNOSIS — I2510 Atherosclerotic heart disease of native coronary artery without angina pectoris: Secondary | ICD-10-CM | POA: Diagnosis not present

## 2021-03-24 DIAGNOSIS — Z01818 Encounter for other preprocedural examination: Secondary | ICD-10-CM

## 2021-03-24 DIAGNOSIS — K921 Melena: Secondary | ICD-10-CM | POA: Diagnosis not present

## 2021-03-24 DIAGNOSIS — J439 Emphysema, unspecified: Secondary | ICD-10-CM | POA: Diagnosis not present

## 2021-03-24 DIAGNOSIS — G4733 Obstructive sleep apnea (adult) (pediatric): Secondary | ICD-10-CM | POA: Diagnosis not present

## 2021-03-24 DIAGNOSIS — H9190 Unspecified hearing loss, unspecified ear: Secondary | ICD-10-CM | POA: Diagnosis not present

## 2021-03-24 DIAGNOSIS — E785 Hyperlipidemia, unspecified: Secondary | ICD-10-CM | POA: Diagnosis present

## 2021-03-24 DIAGNOSIS — I429 Cardiomyopathy, unspecified: Secondary | ICD-10-CM | POA: Diagnosis not present

## 2021-03-24 DIAGNOSIS — I482 Chronic atrial fibrillation, unspecified: Secondary | ICD-10-CM

## 2021-03-24 DIAGNOSIS — Z8041 Family history of malignant neoplasm of ovary: Secondary | ICD-10-CM

## 2021-03-24 DIAGNOSIS — I495 Sick sinus syndrome: Secondary | ICD-10-CM | POA: Diagnosis present

## 2021-03-24 DIAGNOSIS — I480 Paroxysmal atrial fibrillation: Secondary | ICD-10-CM | POA: Diagnosis not present

## 2021-03-24 DIAGNOSIS — Z8616 Personal history of COVID-19: Secondary | ICD-10-CM

## 2021-03-24 DIAGNOSIS — E669 Obesity, unspecified: Secondary | ICD-10-CM | POA: Diagnosis not present

## 2021-03-24 DIAGNOSIS — I10 Essential (primary) hypertension: Secondary | ICD-10-CM | POA: Diagnosis not present

## 2021-03-24 DIAGNOSIS — I110 Hypertensive heart disease with heart failure: Secondary | ICD-10-CM | POA: Diagnosis present

## 2021-03-24 DIAGNOSIS — D630 Anemia in neoplastic disease: Secondary | ICD-10-CM | POA: Diagnosis present

## 2021-03-24 DIAGNOSIS — Z87891 Personal history of nicotine dependence: Secondary | ICD-10-CM

## 2021-03-24 DIAGNOSIS — R1900 Intra-abdominal and pelvic swelling, mass and lump, unspecified site: Secondary | ICD-10-CM | POA: Diagnosis present

## 2021-03-24 DIAGNOSIS — I5032 Chronic diastolic (congestive) heart failure: Secondary | ICD-10-CM | POA: Diagnosis not present

## 2021-03-24 DIAGNOSIS — J849 Interstitial pulmonary disease, unspecified: Secondary | ICD-10-CM | POA: Diagnosis present

## 2021-03-24 DIAGNOSIS — Z79899 Other long term (current) drug therapy: Secondary | ICD-10-CM | POA: Diagnosis not present

## 2021-03-24 DIAGNOSIS — Z7901 Long term (current) use of anticoagulants: Secondary | ICD-10-CM

## 2021-03-24 DIAGNOSIS — I4891 Unspecified atrial fibrillation: Secondary | ICD-10-CM | POA: Diagnosis present

## 2021-03-24 DIAGNOSIS — C189 Malignant neoplasm of colon, unspecified: Secondary | ICD-10-CM | POA: Diagnosis present

## 2021-03-24 HISTORY — PX: COLECTOMY: SHX59

## 2021-03-24 LAB — SARS CORONAVIRUS 2 BY RT PCR (HOSPITAL ORDER, PERFORMED IN ~~LOC~~ HOSPITAL LAB): SARS Coronavirus 2: NEGATIVE

## 2021-03-24 SURGERY — COLECTOMY, PARTIAL, ROBOT-ASSISTED, LAPAROSCOPIC
Anesthesia: General | Site: Abdomen

## 2021-03-24 MED ORDER — PANTOPRAZOLE SODIUM 40 MG PO TBEC
40.0000 mg | DELAYED_RELEASE_TABLET | Freq: Every day | ORAL | Status: DC
  Administered 2021-03-25 – 2021-03-28 (×4): 40 mg via ORAL
  Filled 2021-03-24 (×5): qty 1

## 2021-03-24 MED ORDER — CHLORHEXIDINE GLUCONATE 0.12 % MT SOLN
15.0000 mL | Freq: Once | OROMUCOSAL | Status: AC
  Administered 2021-03-24: 15 mL via OROMUCOSAL

## 2021-03-24 MED ORDER — SPY AGENT GREEN - (INDOCYANINE FOR INJECTION)
INTRAMUSCULAR | Status: DC | PRN
  Administered 2021-03-24: 3 mL via INTRAVENOUS

## 2021-03-24 MED ORDER — LIDOCAINE 20MG/ML (2%) 15 ML SYRINGE OPTIME
INTRAMUSCULAR | Status: DC | PRN
  Administered 2021-03-24: 1 mg/kg/h via INTRAVENOUS

## 2021-03-24 MED ORDER — SIMETHICONE 80 MG PO CHEW: 40.0000 mg | CHEWABLE_TABLET | Freq: Four times a day (QID) | ORAL | Status: DC | PRN

## 2021-03-24 MED ORDER — DEXAMETHASONE SODIUM PHOSPHATE 10 MG/ML IJ SOLN
INTRAMUSCULAR | Status: AC
  Filled 2021-03-24: qty 1

## 2021-03-24 MED ORDER — PROPOFOL 10 MG/ML IV BOLUS
INTRAVENOUS | Status: DC | PRN
  Administered 2021-03-24: 100 mg via INTRAVENOUS

## 2021-03-24 MED ORDER — LACTATED RINGERS IV SOLN: INTRAVENOUS | Status: DC | PRN

## 2021-03-24 MED ORDER — 0.9 % SODIUM CHLORIDE (POUR BTL) OPTIME
TOPICAL | Status: DC | PRN
  Administered 2021-03-24: 2000 mL

## 2021-03-24 MED ORDER — DIPHENHYDRAMINE HCL 12.5 MG/5ML PO ELIX: 12.5000 mg | ORAL_SOLUTION | Freq: Four times a day (QID) | ORAL | Status: DC | PRN

## 2021-03-24 MED ORDER — ALVIMOPAN 12 MG PO CAPS
12.0000 mg | ORAL_CAPSULE | Freq: Two times a day (BID) | ORAL | Status: DC
  Administered 2021-03-25 (×2): 12 mg via ORAL
  Filled 2021-03-24 (×2): qty 1

## 2021-03-24 MED ORDER — MAGNESIUM CHLORIDE 64 MG PO TBEC: 64.0000 mg | DELAYED_RELEASE_TABLET | Freq: Every day | ORAL | Status: DC

## 2021-03-24 MED ORDER — BUPIVACAINE LIPOSOME 1.3 % IJ SUSP
INTRAMUSCULAR | Status: DC | PRN
  Administered 2021-03-24: 20 mL

## 2021-03-24 MED ORDER — FENTANYL CITRATE (PF) 100 MCG/2ML IJ SOLN
INTRAMUSCULAR | Status: AC
  Filled 2021-03-24: qty 2

## 2021-03-24 MED ORDER — SUGAMMADEX SODIUM 500 MG/5ML IV SOLN
INTRAVENOUS | Status: AC
  Filled 2021-03-24: qty 5

## 2021-03-24 MED ORDER — SODIUM CHLORIDE 0.9 % IV SOLN
2.0000 g | Freq: Two times a day (BID) | INTRAVENOUS | Status: AC
  Administered 2021-03-24: 2 g via INTRAVENOUS
  Filled 2021-03-24: qty 2

## 2021-03-24 MED ORDER — FENTANYL CITRATE PF 50 MCG/ML IJ SOSY: 25.0000 ug | PREFILLED_SYRINGE | INTRAMUSCULAR | Status: DC | PRN

## 2021-03-24 MED ORDER — ONDANSETRON HCL 4 MG/2ML IJ SOLN: 4.0000 mg | Freq: Four times a day (QID) | INTRAMUSCULAR | Status: DC | PRN

## 2021-03-24 MED ORDER — ACETAMINOPHEN 500 MG PO TABS
1000.0000 mg | ORAL_TABLET | ORAL | Status: AC
  Administered 2021-03-24: 1000 mg via ORAL
  Filled 2021-03-24: qty 2

## 2021-03-24 MED ORDER — ALBUTEROL SULFATE HFA 108 (90 BASE) MCG/ACT IN AERS
1.0000 | INHALATION_SPRAY | Freq: Four times a day (QID) | RESPIRATORY_TRACT | Status: DC | PRN
Start: 2021-03-24 — End: 2021-03-24

## 2021-03-24 MED ORDER — MAGNESIUM OXIDE -MG SUPPLEMENT 400 (240 MG) MG PO TABS
200.0000 mg | ORAL_TABLET | Freq: Every day | ORAL | Status: DC
  Administered 2021-03-24 – 2021-03-28 (×5): 200 mg via ORAL
  Filled 2021-03-24 (×5): qty 1

## 2021-03-24 MED ORDER — ENOXAPARIN SODIUM 40 MG/0.4ML IJ SOSY
40.0000 mg | PREFILLED_SYRINGE | INTRAMUSCULAR | Status: DC
  Administered 2021-03-25 – 2021-03-26 (×2): 40 mg via SUBCUTANEOUS
  Filled 2021-03-24 (×2): qty 0.4

## 2021-03-24 MED ORDER — DOFETILIDE 250 MCG PO CAPS
500.0000 ug | ORAL_CAPSULE | Freq: Two times a day (BID) | ORAL | Status: DC
Start: 2021-03-24 — End: 2021-03-28
  Administered 2021-03-24 – 2021-03-28 (×8): 500 ug via ORAL
  Filled 2021-03-24 (×11): qty 2

## 2021-03-24 MED ORDER — CHLORHEXIDINE GLUCONATE CLOTH 2 % EX PADS
6.0000 | MEDICATED_PAD | Freq: Every day | CUTANEOUS | Status: DC
  Administered 2021-03-24 – 2021-03-25 (×2): 6 via TOPICAL

## 2021-03-24 MED ORDER — HYDROMORPHONE HCL 1 MG/ML IJ SOLN: 0.5000 mg | INTRAMUSCULAR | Status: DC | PRN

## 2021-03-24 MED ORDER — GABAPENTIN 300 MG PO CAPS
300.0000 mg | ORAL_CAPSULE | Freq: Two times a day (BID) | ORAL | Status: DC
  Administered 2021-03-24: 300 mg via ORAL

## 2021-03-24 MED ORDER — PHENYLEPHRINE HCL (PRESSORS) 10 MG/ML IV SOLN
INTRAVENOUS | Status: AC
  Filled 2021-03-24: qty 2

## 2021-03-24 MED ORDER — GABAPENTIN 300 MG PO CAPS
300.0000 mg | ORAL_CAPSULE | ORAL | Status: AC
  Administered 2021-03-24: 300 mg via ORAL
  Filled 2021-03-24: qty 1

## 2021-03-24 MED ORDER — ISOSORBIDE MONONITRATE ER 60 MG PO TB24
60.0000 mg | ORAL_TABLET | Freq: Every day | ORAL | Status: DC
  Administered 2021-03-25 – 2021-03-26 (×2): 60 mg via ORAL
  Filled 2021-03-24 (×2): qty 1

## 2021-03-24 MED ORDER — ORAL CARE MOUTH RINSE: 15.0000 mL | Freq: Once | OROMUCOSAL | Status: AC

## 2021-03-24 MED ORDER — LACTATED RINGERS IR SOLN
Status: DC | PRN
  Administered 2021-03-24: 1000 mL

## 2021-03-24 MED ORDER — ALVIMOPAN 12 MG PO CAPS
12.0000 mg | ORAL_CAPSULE | ORAL | Status: AC
  Administered 2021-03-24: 12 mg via ORAL
  Filled 2021-03-24: qty 1

## 2021-03-24 MED ORDER — ONDANSETRON HCL 4 MG/2ML IJ SOLN: 4.0000 mg | Freq: Once | INTRAMUSCULAR | Status: DC | PRN

## 2021-03-24 MED ORDER — DEXAMETHASONE SODIUM PHOSPHATE 10 MG/ML IJ SOLN
INTRAMUSCULAR | Status: DC | PRN
  Administered 2021-03-24: 10 mg via INTRAVENOUS

## 2021-03-24 MED ORDER — SACCHAROMYCES BOULARDII 250 MG PO CAPS
250.0000 mg | ORAL_CAPSULE | Freq: Two times a day (BID) | ORAL | Status: DC
  Administered 2021-03-24 – 2021-03-26 (×5): 250 mg via ORAL
  Filled 2021-03-24 (×5): qty 1

## 2021-03-24 MED ORDER — ENSURE PRE-SURGERY PO LIQD
296.0000 mL | Freq: Once | ORAL | Status: DC
  Filled 2021-03-24: qty 296

## 2021-03-24 MED ORDER — FENTANYL CITRATE (PF) 100 MCG/2ML IJ SOLN
INTRAMUSCULAR | Status: DC | PRN
Start: 2021-03-24 — End: 2021-03-24
  Administered 2021-03-24: 50 ug via INTRAVENOUS

## 2021-03-24 MED ORDER — FUROSEMIDE 20 MG PO TABS
20.0000 mg | ORAL_TABLET | Freq: Every day | ORAL | Status: DC
  Administered 2021-03-24 – 2021-03-26 (×3): 20 mg via ORAL
  Filled 2021-03-24 (×3): qty 1

## 2021-03-24 MED ORDER — GABAPENTIN 300 MG PO CAPS: 300.0000 mg | ORAL_CAPSULE | Freq: Two times a day (BID) | ORAL | Status: DC

## 2021-03-24 MED ORDER — KCL IN DEXTROSE-NACL 20-5-0.45 MEQ/L-%-% IV SOLN
INTRAVENOUS | Status: DC
  Filled 2021-03-24 (×3): qty 1000

## 2021-03-24 MED ORDER — DIPHENHYDRAMINE HCL 50 MG/ML IJ SOLN: 12.5000 mg | Freq: Four times a day (QID) | INTRAMUSCULAR | Status: DC | PRN

## 2021-03-24 MED ORDER — ENSURE PRE-SURGERY PO LIQD
592.0000 mL | Freq: Once | ORAL | Status: DC
  Filled 2021-03-24: qty 592

## 2021-03-24 MED ORDER — NITROGLYCERIN 0.4 MG SL SUBL: 0.4000 mg | SUBLINGUAL_TABLET | SUBLINGUAL | Status: DC | PRN

## 2021-03-24 MED ORDER — ROCURONIUM BROMIDE 10 MG/ML (PF) SYRINGE
PREFILLED_SYRINGE | INTRAVENOUS | Status: DC | PRN
  Administered 2021-03-24: 70 mg via INTRAVENOUS

## 2021-03-24 MED ORDER — SODIUM CHLORIDE 0.9 % IV SOLN
2.0000 g | INTRAVENOUS | Status: AC
  Administered 2021-03-24: 2 g via INTRAVENOUS
  Filled 2021-03-24: qty 2

## 2021-03-24 MED ORDER — LACTATED RINGERS IV SOLN: INTRAVENOUS | Status: DC

## 2021-03-24 MED ORDER — FENTANYL CITRATE (PF) 250 MCG/5ML IJ SOLN
INTRAMUSCULAR | Status: AC
  Filled 2021-03-24: qty 5

## 2021-03-24 MED ORDER — LIDOCAINE HCL (PF) 2 % IJ SOLN
INTRAMUSCULAR | Status: AC
  Filled 2021-03-24: qty 15

## 2021-03-24 MED ORDER — SUCCINYLCHOLINE CHLORIDE 200 MG/10ML IV SOSY
PREFILLED_SYRINGE | INTRAVENOUS | Status: AC
  Filled 2021-03-24: qty 10

## 2021-03-24 MED ORDER — ONDANSETRON HCL 4 MG/2ML IJ SOLN
INTRAMUSCULAR | Status: AC
  Filled 2021-03-24: qty 2

## 2021-03-24 MED ORDER — BUPIVACAINE-EPINEPHRINE 0.25% -1:200000 IJ SOLN
INTRAMUSCULAR | Status: DC | PRN
  Administered 2021-03-24: 30 mL

## 2021-03-24 MED ORDER — BUPIVACAINE-EPINEPHRINE (PF) 0.25% -1:200000 IJ SOLN
INTRAMUSCULAR | Status: AC
  Filled 2021-03-24: qty 30

## 2021-03-24 MED ORDER — GABAPENTIN 300 MG PO CAPS
300.0000 mg | ORAL_CAPSULE | Freq: Two times a day (BID) | ORAL | Status: DC
  Administered 2021-03-24 – 2021-03-28 (×8): 300 mg via ORAL
  Filled 2021-03-24 (×9): qty 1

## 2021-03-24 MED ORDER — LIDOCAINE 2% (20 MG/ML) 5 ML SYRINGE
INTRAMUSCULAR | Status: DC | PRN
Start: 2021-03-24 — End: 2021-03-24
  Administered 2021-03-24: 60 mg via INTRAVENOUS

## 2021-03-24 MED ORDER — ONDANSETRON HCL 4 MG/2ML IJ SOLN
INTRAMUSCULAR | Status: DC | PRN
  Administered 2021-03-24 (×2): 4 mg via INTRAVENOUS

## 2021-03-24 MED ORDER — SUGAMMADEX SODIUM 500 MG/5ML IV SOLN
INTRAVENOUS | Status: DC | PRN
  Administered 2021-03-24: 400 mg via INTRAVENOUS

## 2021-03-24 MED ORDER — POLYVINYL ALCOHOL 1.4 % OP SOLN
1.0000 [drp] | Freq: Every day | OPHTHALMIC | Status: DC | PRN
  Filled 2021-03-24: qty 15

## 2021-03-24 MED ORDER — BUPIVACAINE LIPOSOME 1.3 % IJ SUSP: 20.0000 mL | Freq: Once | INTRAMUSCULAR | Status: DC

## 2021-03-24 MED ORDER — DILTIAZEM HCL ER COATED BEADS 120 MG PO CP24
120.0000 mg | ORAL_CAPSULE | Freq: Two times a day (BID) | ORAL | Status: DC
  Administered 2021-03-24 – 2021-03-28 (×8): 120 mg via ORAL
  Filled 2021-03-24 (×8): qty 1

## 2021-03-24 MED ORDER — ONDANSETRON HCL 4 MG PO TABS: 4.0000 mg | ORAL_TABLET | Freq: Four times a day (QID) | ORAL | Status: DC | PRN

## 2021-03-24 MED ORDER — ROCURONIUM BROMIDE 10 MG/ML (PF) SYRINGE
PREFILLED_SYRINGE | INTRAVENOUS | Status: AC
  Filled 2021-03-24: qty 10

## 2021-03-24 MED ORDER — ALUM & MAG HYDROXIDE-SIMETH 200-200-20 MG/5ML PO SUSP: 30.0000 mL | Freq: Four times a day (QID) | ORAL | Status: DC | PRN

## 2021-03-24 MED ORDER — PROPOFOL 10 MG/ML IV BOLUS
INTRAVENOUS | Status: AC
  Filled 2021-03-24: qty 20

## 2021-03-24 MED ORDER — ENSURE SURGERY PO LIQD
237.0000 mL | Freq: Two times a day (BID) | ORAL | Status: DC
  Administered 2021-03-24 – 2021-03-28 (×6): 237 mL via ORAL
  Filled 2021-03-24 (×9): qty 237

## 2021-03-24 MED ORDER — EPHEDRINE 5 MG/ML INJ
INTRAVENOUS | Status: AC
  Filled 2021-03-24: qty 5

## 2021-03-24 MED ORDER — ALBUTEROL SULFATE (2.5 MG/3ML) 0.083% IN NEBU: 2.5000 mg | INHALATION_SOLUTION | Freq: Four times a day (QID) | RESPIRATORY_TRACT | Status: DC | PRN

## 2021-03-24 MED ORDER — ACETAMINOPHEN 500 MG PO TABS
1000.0000 mg | ORAL_TABLET | Freq: Four times a day (QID) | ORAL | Status: DC
  Administered 2021-03-24 – 2021-03-28 (×14): 1000 mg via ORAL
  Filled 2021-03-24 (×14): qty 2

## 2021-03-24 MED ORDER — LIDOCAINE HCL (PF) 2 % IJ SOLN
INTRAMUSCULAR | Status: AC
  Filled 2021-03-24: qty 5

## 2021-03-24 MED ORDER — SUCCINYLCHOLINE CHLORIDE 200 MG/10ML IV SOSY
PREFILLED_SYRINGE | INTRAVENOUS | Status: DC | PRN
  Administered 2021-03-24: 100 mg via INTRAVENOUS

## 2021-03-24 MED ORDER — FENTANYL CITRATE (PF) 100 MCG/2ML IJ SOLN
INTRAMUSCULAR | Status: DC | PRN
  Administered 2021-03-24 (×5): 50 ug via INTRAVENOUS

## 2021-03-24 MED ORDER — METOPROLOL TARTRATE 25 MG PO TABS
25.0000 mg | ORAL_TABLET | Freq: Two times a day (BID) | ORAL | Status: DC
  Administered 2021-03-24 – 2021-03-26 (×4): 25 mg via ORAL
  Filled 2021-03-24 (×4): qty 1

## 2021-03-24 MED ORDER — BUPIVACAINE LIPOSOME 1.3 % IJ SUSP
INTRAMUSCULAR | Status: AC
  Filled 2021-03-24: qty 20

## 2021-03-24 MED ORDER — AMISULPRIDE (ANTIEMETIC) 5 MG/2ML IV SOLN: 10.0000 mg | Freq: Once | INTRAVENOUS | Status: DC | PRN

## 2021-03-24 SURGICAL SUPPLY — 94 items
BAG COUNTER SPONGE SURGICOUNT (BAG) ×2 IMPLANT
BAG SPNG CNTER NS LX DISP (BAG) ×1
BLADE EXTENDED COATED 6.5IN (ELECTRODE) IMPLANT
CANNULA REDUC XI 12-8 STAPL (CANNULA) ×2
CANNULA REDUCER 12-8 DVNC XI (CANNULA) IMPLANT
CELLS DAT CNTRL 66122 CELL SVR (MISCELLANEOUS) ×1 IMPLANT
COVER SURGICAL LIGHT HANDLE (MISCELLANEOUS) ×3 IMPLANT
COVER TIP SHEARS 8 DVNC (MISCELLANEOUS) ×1 IMPLANT
COVER TIP SHEARS 8MM DA VINCI (MISCELLANEOUS) ×2
DECANTER SPIKE VIAL GLASS SM (MISCELLANEOUS) IMPLANT
DRAIN CHANNEL 19F RND (DRAIN) IMPLANT
DRAPE ARM DVNC X/XI (DISPOSABLE) ×4 IMPLANT
DRAPE COLUMN DVNC XI (DISPOSABLE) ×1 IMPLANT
DRAPE DA VINCI XI ARM (DISPOSABLE) ×8
DRAPE DA VINCI XI COLUMN (DISPOSABLE) ×2
DRAPE SURG IRRIG POUCH 19X23 (DRAPES) ×2 IMPLANT
DRSG OPSITE POSTOP 4X10 (GAUZE/BANDAGES/DRESSINGS) IMPLANT
DRSG OPSITE POSTOP 4X6 (GAUZE/BANDAGES/DRESSINGS) ×1 IMPLANT
DRSG OPSITE POSTOP 4X8 (GAUZE/BANDAGES/DRESSINGS) IMPLANT
ELECT PENCIL ROCKER SW 15FT (MISCELLANEOUS) ×2 IMPLANT
ELECT REM PT RETURN 15FT ADLT (MISCELLANEOUS) ×2 IMPLANT
ENDOLOOP SUT PDS II  0 18 (SUTURE)
ENDOLOOP SUT PDS II 0 18 (SUTURE) IMPLANT
EVACUATOR SILICONE 100CC (DRAIN) IMPLANT
GLOVE SURG ENC MOIS LTX SZ6.5 (GLOVE) ×6 IMPLANT
GLOVE SURG UNDER POLY LF SZ7 (GLOVE) ×4 IMPLANT
GOWN STRL REUS W/TWL XL LVL3 (GOWN DISPOSABLE) ×6 IMPLANT
GRASPER SUT TROCAR 14GX15 (MISCELLANEOUS) IMPLANT
HOLDER FOLEY CATH W/STRAP (MISCELLANEOUS) ×2 IMPLANT
IRRIG SUCT STRYKERFLOW 2 WTIP (MISCELLANEOUS) ×2
IRRIGATION SUCT STRKRFLW 2 WTP (MISCELLANEOUS) ×1 IMPLANT
KIT PROCEDURE DA VINCI SI (MISCELLANEOUS) ×2
KIT PROCEDURE DVNC SI (MISCELLANEOUS) IMPLANT
KIT TURNOVER KIT A (KITS) ×1 IMPLANT
NDL INSUFFLATION 14GA 120MM (NEEDLE) ×1 IMPLANT
NEEDLE INSUFFLATION 14GA 120MM (NEEDLE) ×2 IMPLANT
PACK CARDIOVASCULAR III (CUSTOM PROCEDURE TRAY) ×2 IMPLANT
PACK COLON (CUSTOM PROCEDURE TRAY) ×2 IMPLANT
PAD POSITIONING PINK XL (MISCELLANEOUS) ×2 IMPLANT
RELOAD STAPLE 60 2.5 WHT DVNC (STAPLE) IMPLANT
RELOAD STAPLE 60 3.5 BLU DVNC (STAPLE) IMPLANT
RELOAD STAPLE 60 4.3 GRN DVNC (STAPLE) IMPLANT
RELOAD STAPLER 2.5X60 WHT DVNC (STAPLE) ×1 IMPLANT
RELOAD STAPLER 3.5X60 BLU DVNC (STAPLE) ×1 IMPLANT
RELOAD STAPLER 4.3X60 GRN DVNC (STAPLE) ×1 IMPLANT
RETRACTOR WND ALEXIS 18 MED (MISCELLANEOUS) IMPLANT
RTRCTR WOUND ALEXIS 18CM MED (MISCELLANEOUS) ×2
SCISSORS LAP 5X35 DISP (ENDOMECHANICALS) IMPLANT
SEAL CANN UNIV 5-8 DVNC XI (MISCELLANEOUS) ×3 IMPLANT
SEAL XI 5MM-8MM UNIVERSAL (MISCELLANEOUS) ×6
SEALER VESSEL DA VINCI XI (MISCELLANEOUS) ×2
SEALER VESSEL EXT DVNC XI (MISCELLANEOUS) ×1 IMPLANT
SOLUTION ELECTROLUBE (MISCELLANEOUS) ×2 IMPLANT
STAPLER 60 DA VINCI SURE FORM (STAPLE) ×2
STAPLER 60 SUREFORM DVNC (STAPLE) IMPLANT
STAPLER CANNULA SEAL DVNC XI (STAPLE) IMPLANT
STAPLER CANNULA SEAL XI (STAPLE) ×2
STAPLER ECHELON POWER CIR 29 (STAPLE) IMPLANT
STAPLER ECHELON POWER CIR 31 (STAPLE) IMPLANT
STAPLER RELOAD 2.5X60 WHITE (STAPLE) ×2
STAPLER RELOAD 2.5X60 WHT DVNC (STAPLE) ×1
STAPLER RELOAD 3.5X60 BLU DVNC (STAPLE) ×1
STAPLER RELOAD 3.5X60 BLUE (STAPLE) ×2
STAPLER RELOAD 4.3X60 GREEN (STAPLE) ×2
STAPLER RELOAD 4.3X60 GRN DVNC (STAPLE) ×1
STOPCOCK 4 WAY LG BORE MALE ST (IV SETS) ×2 IMPLANT
SUT ETHILON 2 0 PS N (SUTURE) IMPLANT
SUT NOVA NAB DX-16 0-1 5-0 T12 (SUTURE) ×4 IMPLANT
SUT PROLENE 2 0 KS (SUTURE) IMPLANT
SUT SILK 2 0 (SUTURE) ×2
SUT SILK 2 0 SH CR/8 (SUTURE) IMPLANT
SUT SILK 2-0 18XBRD TIE 12 (SUTURE) ×1 IMPLANT
SUT SILK 3 0 (SUTURE)
SUT SILK 3 0 SH CR/8 (SUTURE) ×2 IMPLANT
SUT SILK 3-0 18XBRD TIE 12 (SUTURE) IMPLANT
SUT V-LOC BARB 180 2/0GR6 GS22 (SUTURE) ×4
SUT VIC AB 2-0 SH 18 (SUTURE) IMPLANT
SUT VIC AB 2-0 SH 27 (SUTURE)
SUT VIC AB 2-0 SH 27X BRD (SUTURE) IMPLANT
SUT VIC AB 3-0 SH 18 (SUTURE) IMPLANT
SUT VIC AB 4-0 PS2 27 (SUTURE) ×4 IMPLANT
SUT VICRYL 0 UR6 27IN ABS (SUTURE) ×2 IMPLANT
SUTURE V-LC BRB 180 2/0GR6GS22 (SUTURE) IMPLANT
SYR 10ML ECCENTRIC (SYRINGE) ×2 IMPLANT
SYS LAPSCP GELPORT 120MM (MISCELLANEOUS)
SYS WOUND ALEXIS 18CM MED (MISCELLANEOUS)
SYSTEM LAPSCP GELPORT 120MM (MISCELLANEOUS) IMPLANT
SYSTEM WOUND ALEXIS 18CM MED (MISCELLANEOUS) IMPLANT
TOWEL OR 17X26 10 PK STRL BLUE (TOWEL DISPOSABLE) IMPLANT
TOWEL OR NON WOVEN STRL DISP B (DISPOSABLE) ×2 IMPLANT
TRAY FOLEY MTR SLVR 16FR STAT (SET/KITS/TRAYS/PACK) ×2 IMPLANT
TROCAR ADV FIXATION 5X100MM (TROCAR) ×2 IMPLANT
TUBING CONNECTING 10 (TUBING) ×4 IMPLANT
TUBING INSUFFLATION 10FT LAP (TUBING) ×2 IMPLANT

## 2021-03-24 NOTE — Anesthesia Procedure Notes (Signed)
Procedure Name: Intubation Date/Time: 03/24/2021 8:47 AM Performed by: Lissa Morales, CRNA Pre-anesthesia Checklist: Patient identified, Emergency Drugs available, Suction available and Patient being monitored Patient Re-evaluated:Patient Re-evaluated prior to induction Oxygen Delivery Method: Circle system utilized Preoxygenation: Pre-oxygenation with 100% oxygen Induction Type: IV induction Ventilation: Mask ventilation without difficulty Laryngoscope Size: Mac and 4 Grade View: Grade I Tube type: Oral Tube size: 8.0 mm Number of attempts: 1 Airway Equipment and Method: Stylet and Oral airway Placement Confirmation: ETT inserted through vocal cords under direct vision, positive ETCO2 and breath sounds checked- equal and bilateral Secured at: 22 cm Tube secured with: Tape Dental Injury: Teeth and Oropharynx as per pre-operative assessment

## 2021-03-24 NOTE — Op Note (Signed)
03/24/2021  11:10 AM  PATIENT:  Donald [name]  87 y.o. male  Patient Care Team: Virgie Dad, MD as PCP - General (Internal Medicine) Deboraha Sprang, MD as PCP - Cardiology (Cardiology) Deboraha Sprang, MD as PCP - Electrophysiology (Cardiology) Franchot Gallo, MD (Urology) [name], [name], MD (Dermatology) Monna Fam, MD as Consulting Physician (Ophthalmology) Deboraha Sprang, MD as Consulting Physician (Cardiology) Pyrtle, Lajuan Lines, MD as Consulting Physician (Gastroenterology) Brand Males, MD as Consulting Physician (Pulmonary Disease) Charlton Haws, [facility] as Pharmacist (Pharmacist)  PRE-OPERATIVE DIAGNOSIS:  COLON CANCER  POST-OPERATIVE DIAGNOSIS:  COLON CANCER  PROCEDURE:   XI ROBOT ASSISTED LAPAROSCOPIC RIGHT COLECTOMY WITH BILATERAL TAP BLOCK AND ITNTRAOPERATIVE ASSESSMENT OF PERFUSION USING FIREFLY    Surgeon(s): Leighton Ruff, MD  ASSISTANT: Carlena Hurl, PA   ANESTHESIA:   local and general  EBL: 54ml Total I/O In: 1100 [I.V.:1000; IV Piggyback:100] Out: 210 [Urine:160; Blood:50]  Delay start of Pharmacological VTE agent (>24hrs) due to surgical blood loss or risk of bleeding:  no  DRAINS: none   SPECIMEN:  Source of Specimen:  R colon  DISPOSITION OF SPECIMEN:  PATHOLOGY  COUNTS:  YES  PLAN OF CARE: Admit to inpatient   PATIENT DISPOSITION:  PACU - hemodynamically stable.  INDICATION:    87 y.o. M with anemia due to an ascending colon cancer.  I recommended segmental resection:  The anatomy & physiology of the digestive tract was discussed.  The pathophysiology was discussed.  Natural history risks without surgery was discussed.   I worked to give an overview of the disease and the frequent need to have multispecialty involvement.  I feel the risks of no intervention will lead to serious problems that outweigh the operative risks; therefore, I recommended a partial colectomy to remove the pathology.  Laparoscopic & open techniques were  discussed.   Risks such as bleeding, infection, abscess, leak, reoperation, possible ostomy, hernia, heart attack, death, and other risks were discussed.  I noted a good likelihood this will help address the problem.   Goals of post-operative recovery were discussed as well.    The patient expressed understanding & wished to proceed with surgery.  OR FINDINGS:   Patient had mass noted in the cecum  No obvious metastatic disease on visceral parietal peritoneum or liver.  DESCRIPTION:   Informed consent was confirmed.  The patient underwent general anaesthesia without difficulty.  The patient was positioned appropriately.  VTE prevention in place.  The patient's abdomen was clipped, prepped, & draped in a sterile fashion.  Surgical timeout confirmed our plan.  The patient was positioned in reverse Trendelenburg.  Abdominal entry was gained using a Varies needle in the LUQ.  Entry was clean.  I induced carbon dioxide insufflation.  An 22mm robotic port was placed in the lateral LUQ.  Camera inspection revealed no injury.  Extra ports were carefully placed under direct laparoscopic visualization.  I laparoscopically reflected the greater omentum and the upper abdomen the small bowel in the peilvis. The patient was appropriately positioned and the robot was docked to the patient's right side.  Instruments were placed under direct visualization.    I began by identifying the ileocolic artery and vein within the mesentery. Dissection was bluntly carried around these structures. The duodenum was identified and free from the structures. I then separated the structures bluntly and used the robotic vessel sealer device to transect these.  I developed the retroperitoneal plane bluntly.  I then freed the appendix off its  attachments to the pelvic wall. I mobilized the terminal ileum.  I took care to avoid injuring any retroperitoneal structures.  After this I began to mobilize laterally down the white line of  [place] and then took down the hepatic flexure using the Enseal device. I mobilized the omentum off of the right transverse colon. The entire colon was then flipped medially and mobilized off of the retroperitoneal structures until I could visualize the lateral edge of the duodenum underneath.  I gently freed the duodenal attachments.   I identified a portion of mesentery of the transverse colon just proximal to the right branch of the middle colic.  I divided up to the colon from the previous dissection of the mesentery using the robotic vessel sealer.  I then had 73ml of diluted firefly injected IV to assess vascular perfusion.  There was good perfusion noted in the transverse colon up to the level of mesenteric transection and in the terminal ileum.  I then divided the terminal ileal mesentery in similar fashion.  At that point, the terminal ileum was divided with a blue load robotic 60 mm stapler.  The transverse colon was divided with a green load robotic stapler.  The specimen was then completely free and placed in the right upper quadrant.  Hemostasis was good.I then oriented the remaining terminal ileum and transverse colon and a isoperistaltic fashion.  I placed an enterotomy in the small bowel and colon using the robotic scissors.  I then introduced a white load 60 mm robotic stapler into both enterotomies and created an anastomosis between the small bowel and transverse colon.  Hemostasis within the staple line was good.  The common enterotomy channel was closed using 2 running 2-0 V-Loc sutures.  The abdomen was inspected for hemostasis.  The mesentery was not twisted. The omentum was then brought down over the anastomosis.  At this point the robot was undocked.  The 12 mm suprapubic port was enlarged to a Pfannenstiel incision and an [place] wound protector was placed.  The specimen was removed from the abdomen and evaluated.  Once the abdomen was inspected for hemostasis, the [place] wound protector was  removed.    The peritoneum of the Pfannenstiel incision was closed using a running 0 Vicryl suture.  The fascia was then closed using 2 running #1 Novafil sutures.  The subcutaneous tissue of the extraction incision was closed using a running 2-0 Vicryl suture. The skin was then closed using running subcuticular 4-0 Vicryl sutures.  A sterile dressing was applied.  The remaining port sites were closed using interrupted 4-0 Vicryl sutures and Dermabond. All counts were correct per operating room staff. The patient was then awakened from anesthesia and sent to the post anesthesia care unit in stable condition.

## 2021-03-24 NOTE — Consult Note (Signed)
Medical Consultation  MAE CIANCI OYD:[id number] DOB: May 03, 1933 DOA: 03/24/2021 PCP: Virgie Dad, MD   Requesting physician: Dr. Marcello Moores Date of consultation: 03/24/21 Reason for consultation: Medical Management  Impression/Recommendations Malignant mass of the colon s/p right colectomy     - per primary team  pAfib Sick sinus syndrome s/p pacer placement     - spoke with Dr. Marcello Moores; she spoke with cardiology and reported that they recommended starting back his home regimen at current dosing     - will leave anticoagulation restart to primary team   HLD     - resume home statin  IDA     - Hgb is stable, follow  HTN     - resume home regimen  ILD COPD OSA     - uses CPAP at night; continue     - resume home inhalers  TRH will follow-up again tomorrow. Please contact me if I can be of assistance in the meanwhile. Thank you for this consultation.  Chief Complaint: colon adenocarcinoma  HPI:  Donald [name] is a 87 y.o. male with medical history significant of afib, SSS s/p PPM, ILD, HTN, IDA, colon mass. Presenting with a colon mass. He was admitted to the surgery service for a colectomy. He had been noted to have worsening anemia w/ his GI team. An EGD and c-scope were performed. He was noted to have a cecal mass that was positive for adenocarcinoma. It was recommended that he have it removed. He has successfully completed a right colectomy w/ general surgery. TRH has been asked to help w/ medical mgmt of his chronic conditions.   He reports compliance on his medications and regular follow up with his specialists and PCP. He denies any recent flares of his chronic conditions. He reports that other than his anemia, he has been in stable health.      Review of Systems:  Denies CP, dyspnea, abdominal pain, N/V/D, fevers. Remainder of ROS is negative for all not mentioned in HPI.   Past Medical History:  Diagnosis Date   Abnormal CT scan, stomach 02/2014   Thickening of  gastric fundus and cardia.  gastritis on EGD 03/2014   Allergic rhinitis due to pollen    Anemia 12/2020   Arthropathy, unspecified, site unspecified    Atherosclerosis    a. Noted by abdominal CT 02/2014 (h/o normal nuc 2011).   Atrial flutter ([place]) 02/24/2014   s/p ablation 11/15   Cardiomyopathy ([place])    tachy mediated - a. TEE (10/15):  EF 30%;  b. Echo after NSR restored (10/15):  mild LVH, EF 55-60%, mild AS, mild AI, mild MR, mild to mod LAE, mild RAE   CHF (congestive heart failure) ([place]) 01/04/2021   COVID-19 11/2020   Diverticulosis    severe in descending, sigmoid colon.    ED (erectile dysfunction)    Emphysema of lung ([place])    Esophageal stricture 03/2014   traversable with endoscope. not dilated.    Gastric AVM    GERD (gastroesophageal reflux disease) 03/2014   small HH and gastritis on EGD   GI bleed    Habitual alcohol use    Hemorrhoids    Hiatal hernia    Hyperlipidemia    Hypertension    Obesity    OSA on CPAP    PAF (paroxysmal atrial fibrillation) ([place])    Pneumonia 12/2020   mild with covid   Presence of permanent cardiac pacemaker    Prostatitis, unspecified  Pulmonary fibrosis (HCC)    Sinus bradycardia    a. s/p STJ dual chamber PPM   Skin cancer    basal and squamous cell   Sleep apnea    Past Surgical History:  Procedure Laterality Date   ATRIAL FLUTTER ABLATION N/A 03/19/2014   RFCA of atrial flutter by Dr Caryl Comes   CARDIOVERSION N/A 02/24/2014   Procedure: CARDIOVERSION;  Surgeon: Lelon Perla, MD;  Location: [facility] ENDOSCOPY;  Service: Cardiovascular;  Laterality: N/A;   CARDIOVERSION Right 09/10/2014   Procedure: CARDIOVERSION;  Surgeon: Deboraha Sprang, MD;  Location: [facility] CATH LAB;  Service: Cardiovascular;  Laterality: Right;   CARDIOVERSION N/A 01/22/2018   Procedure: CARDIOVERSION;  Surgeon: Fay Records, MD;  Location: [facility] ENDOSCOPY;  Service: Cardiovascular;  Laterality: N/A;   COLONOSCOPY N/A 04/23/2015   Procedure: COLONOSCOPY;   Surgeon: Ladene Artist, MD;  Location: [facility] ENDOSCOPY;  Service: Endoscopy;  Laterality: N/A;   COLONOSCOPY     ENTEROSCOPY N/A 06/12/2015   Procedure: ENTEROSCOPY;  Surgeon: Ladene Artist, MD;  Location: [facility] ENDOSCOPY;  Service: Endoscopy;  Laterality: N/A;   EP IMPLANTABLE DEVICE N/A 02/15/2016   Procedure: Pacemaker Implant;  Surgeon: Evans Lance, MD;  Location: [place] CV LAB;  Service: Cardiovascular;  Laterality: N/A;   ESOPHAGOGASTRODUODENOSCOPY N/A 04/09/2014   Procedure: ESOPHAGOGASTRODUODENOSCOPY (EGD);  Surgeon: Inda Castle, MD;  Location: [place];  Service: Endoscopy;  Laterality: N/A;   ESOPHAGOGASTRODUODENOSCOPY N/A 04/23/2015   Procedure: ESOPHAGOGASTRODUODENOSCOPY (EGD);  Surgeon: Ladene Artist, MD;  [facility] ENDOSCOPY;  Service: Endoscopy;  Laterality: N/A;   INGUINAL HERNIA REPAIR     right   INGUINAL HERNIA REPAIR     left   LEFT HEART CATH AND CORONARY ANGIOGRAPHY N/A 03/16/2017   Procedure: LEFT HEART CATH AND CORONARY ANGIOGRAPHY;  Surgeon: Burnell Blanks, MD;  Location: [place] CV LAB;  Service: Cardiovascular;  Laterality: N/A;   ORIF fracture of the elbow  1992   SKIN CANCER EXCISION  05/21/2012   Squamous cell ca   TEE WITHOUT CARDIOVERSION N/A 02/24/2014   Procedure: TRANSESOPHAGEAL ECHOCARDIOGRAM (TEE);  Surgeon: Lelon Perla, MD;  Location: [facility] ENDOSCOPY;  Service: Cardiovascular;  Laterality: N/A;   TONSILLECTOMY     UPPER GASTROINTESTINAL ENDOSCOPY     Social History:  reports that he quit smoking about 12 years ago. His smoking use included cigarettes and cigars. He quit smokeless tobacco use about 17 years ago.  His smokeless tobacco use included chew. He reports that he does not currently use alcohol after a past usage of about 14.0 standard drinks per week. He reports that he does not use drugs.  No Known Allergies Family History  Problem Relation Age of Onset   Cancer Mother        ovarian   Other Father         renal disease- grief over loss of spouse   Cancer Brother        prostate- died of hematologic disorder 2nd to chemo   Pulmonary fibrosis Brother    Diabetes Neg Hx    Coronary artery disease Neg Hx    Colon cancer Neg Hx    Stomach cancer Neg Hx    Rectal cancer Neg Hx     Prior to Admission medications   Medication Sig Start Date End Date Taking? Authorizing Provider  acetaminophen (TYLENOL) 500 MG tablet Take 500 mg by mouth every 6 (six) hours as needed for moderate pain or headache.  Yes [provider]  albuterol (VENTOLIN HFA) 108 (90 Base) MCG/ACT inhaler Inhale TWO puffs into THE lungs every SIX hours as needed FOR wheezing OR shortness of breath 01/25/21  Yes Wert, Margreta Journey, NP  carboxymethylcellulose (REFRESH PLUS) 0.5 % SOLN Place 1 drop into both eyes daily as needed (dry eyes).   Yes [provider]  diltiazem (CARDIZEM CD) 120 MG 24 hr capsule Take 1 capsule (120 mg total) by mouth 2 (two) times daily. 11/27/20  Yes Deboraha Sprang, MD  dofetilide (TIKOSYN) 500 MCG capsule Take 1 capsule (500 mcg total) by mouth 2 (two) times daily. Please keep upcoming appt in May 2022 with Dr. Caryl Comes before anymore refills. Thank you 10/30/20  Yes Camnitz, Will Hassell Done, MD  ELIQUIS 5 MG TABS tablet TAKE ONE TABLET BY MOUTH EVERY MORNING and TAKE ONE TABLET BY MOUTH EVERYDAY AT BEDTIME 02/16/21  Yes Deboraha Sprang, MD  ferrous sulfate 325 (65 FE) MG tablet Take 1 tablet (325 mg total) by mouth daily with breakfast. 04/25/15  Yes Reyne Dumas, MD  furosemide (LASIX) 20 MG tablet TAKE ONE TABLET BY MOUTH ONCE DAILY 11/27/20  Yes Deboraha Sprang, MD  isosorbide mononitrate (IMDUR) 60 MG 24 hr tablet Take 1 tablet (60 mg total) by mouth daily. 10/01/20  Yes Deboraha Sprang, MD  magnesium chloride (SLOW-MAG) 64 MG TBEC SR tablet Take 64 mg by mouth at bedtime.   Yes [provider]  metoprolol tartrate (LOPRESSOR) 25 MG tablet TAKE ONE TABLET BY MOUTH EVERY MORNING and TAKE  ONE TABLET EVERYDAY AT BEDTIME 02/16/21  Yes Deboraha Sprang, MD  Multiple Vitamins-Minerals (ADULT GUMMY) CHEW Chew 1 capsule by mouth 2 (two) times daily.   Yes [provider]  Multiple Vitamins-Minerals (OCUVITE PO) Take 1 tablet by mouth 2 (two) times daily.   Yes [provider]  pantoprazole (PROTONIX) 40 MG tablet Take 1 tablet (40 mg total) by mouth 2 (two) times daily. Protonix ( pantoprazole ) 40 mg twice daily for 6 weeks, then reduce back to 40 mg daily. 02/03/21  Yes Cirigliano, Vito V, DO  Pedialyte (PEDIALYTE) SOLN Take by mouth daily. 6 oz   Yes [provider]  Probiotic Product (ALIGN PO) Take 1 capsule by mouth daily.   Yes [provider]  rosuvastatin (CRESTOR) 10 MG tablet Take 1 tablet (10 mg total) by mouth daily. 01/15/21  Yes Wert, Margreta Journey, NP  sodium chloride (OCEAN) 0.65 % SOLN nasal spray Place 1 spray into both nostrils as needed for congestion.   Yes [provider]  Wheat Dextrin (BENEFIBER) POWD Take 1 Scoop by mouth daily as needed (constipation).   Yes [provider]  nitroGLYCERIN (NITROSTAT) 0.4 MG SL tablet PLACE 1 TABLET UNDER THE TONGUE EVERY 5 MINUTES AS NEEDED FOR CHEST PAIN, MAY REPEAT FOR 3 DOSES. 08/11/20   Deboraha Sprang, MD  Propylhexedrine [place]) INHA Place 1 each into the nose daily as needed (congestion).    [provider]   Physical Exam: Blood pressure 122/71, pulse 62, temperature 97.6 F (36.4 C), temperature source Oral, resp. rate 16, height 5\' 9"  (1.753 m), weight 99 kg, SpO2 100 %. Vitals:   03/24/21 1335 03/24/21 1401  BP:  122/71  Pulse: (!) 59 62  Resp: 17 16  Temp: 97.6 F (36.4 C) 97.6 F (36.4 C)  SpO2: 91% 100%    General: 87 y.o. male resting in bed in NAD Eyes: PERRL, normal sclera ENMT: Nares patent w/o discharge,  orophaynx clear, dentition normal, ears w/o discharge/lesions/ulcers Neck: Supple, trachea midline Cardiovascular: brady, +S1, S2, no m/g/r,  equal pulses throughout Respiratory: CTABL, no w/r/r, normal WOB GI: BS hypoactive, ND, appropriately tender post surgery, no masses noted, no organomegaly noted MSK: No e/c/c Skin: No rashes, bruises, ulcerations noted Neuro: A&O x 3, no focal deficits Psyc: Appropriate interaction and affect, calm/cooperative  Labs on Admission:  Basic Metabolic Panel: No results for input(s): NA, K, CL, CO2, GLUCOSE, BUN, CREATININE, CALCIUM, MG, PHOS in the last 168 hours. Liver Function Tests: No results for input(s): AST, ALT, ALKPHOS, BILITOT, PROT, ALBUMIN in the last 168 hours. No results for input(s): LIPASE, AMYLASE in the last 168 hours. No results for input(s): AMMONIA in the last 168 hours. CBC: No results for input(s): WBC, NEUTROABS, HGB, HCT, MCV, PLT in the last 168 hours. Cardiac Enzymes: No results for input(s): CKTOTAL, CKMB, CKMBINDEX, TROPONINI in the last 168 hours. BNP: Invalid input(s): POCBNP CBG: No results for input(s): GLUCAP in the last 168 hours.  Radiological Exams on Admission: No results found.  EKG: None obtained by primary team.  Time spent: 45 minutes  [facility]  If 7PM-7AM, please contact night-coverage [email or web address] 03/24/2021, 2:56 PM

## 2021-03-24 NOTE — Anesthesia Postprocedure Evaluation (Signed)
Anesthesia Post Note  Patient: Donald [name]  Procedure(s) Performed: XI ROBOT ASSISTED LAPAROSCOPIC PARTIAL COLECTOMY WITH BILATERAL TAP BLOCK AND ITNTRAOPERATIVE ASSESSMENT OF PERFUSION USING FIREFLY (Abdomen)     Patient location during evaluation: PACU Anesthesia Type: General Level of consciousness: awake Pain management: pain level controlled Vital Signs Assessment: post-procedure vital signs reviewed and stable Respiratory status: spontaneous breathing, nonlabored ventilation, respiratory function stable and patient connected to nasal cannula oxygen Cardiovascular status: blood pressure returned to baseline and stable Postop Assessment: no apparent nausea or vomiting Anesthetic complications: no   No notable events documented.  Last Vitals:  Vitals:   03/24/21 1335 03/24/21 1401  BP:  122/71  Pulse: (!) 59 62  Resp: 17 16  Temp: 36.4 C 36.4 C  SpO2: 91% 100%    Last Pain:  Vitals:   03/24/21 1401  TempSrc: Oral  PainSc:                  Celso Granja P Carleen Rhue

## 2021-03-24 NOTE — Interval H&P Note (Signed)
History and Physical Interval Note:  03/24/2021 7:19 AM  Donald [name]  has presented today for surgery, with the diagnosis of COLON CANCER.  The various methods of treatment have been discussed with the patient and family. After consideration of risks, benefits and other options for treatment, the patient has consented to  Procedure(s): XI ROBOT ASSISTED LAPAROSCOPIC PARTIAL COLECTOMY (N/A) as a surgical intervention.  The patient's history has been reviewed, patient examined, no change in status, stable for surgery.  I have reviewed the patient's chart and labs.  Questions were answered to the patient's satisfaction.     Rosario Adie, MD  [facility]

## 2021-03-24 NOTE — Progress Notes (Signed)
Pt arrived from PACU on stretcher. Ambulated to floor bed w/ steadying assist. VSS on RA. Denies nausea. Reports pain 3/10 and tolerable. Honeycomb dressing to lower abd c/d/I. Abd lap sites x 4 with dermabond intact. Foley w/ c/y urine. Pt oriented to callbell and environment. POC discussed w/ pt and wife at bedside.

## 2021-03-24 NOTE — Transfer of Care (Signed)
Immediate Anesthesia Transfer of Care Note  Patient: SHAMUS DESANTIS  Procedure(s) Performed: XI ROBOT ASSISTED LAPAROSCOPIC PARTIAL COLECTOMY WITH BILATERAL TAP BLOCK AND ITNTRAOPERATIVE ASSESSMENT OF PERFUSION USING FIREFLY (Abdomen)  Patient Location: PACU  Anesthesia Type:General  Level of Consciousness: awake, alert , oriented and patient cooperative  Airway & Oxygen Therapy: Patient Spontanous Breathing and Patient connected to face mask oxygen  Post-op Assessment: Report given to RN, Post -op Vital signs reviewed and stable and Patient moving all extremities X 4  Post vital signs: stable  Last Vitals:  Vitals Value Taken Time  BP 102/69 03/24/21 1115  Temp 36.6 C 03/24/21 1115  Pulse 59 03/24/21 1121  Resp 22 03/24/21 1121  SpO2 98 % 03/24/21 1121  Vitals shown include unvalidated device data.  Last Pain:  Vitals:   03/24/21 1115  TempSrc:   PainSc: 0-No pain         Complications: No notable events documented.

## 2021-03-25 DIAGNOSIS — J849 Interstitial pulmonary disease, unspecified: Secondary | ICD-10-CM | POA: Diagnosis not present

## 2021-03-25 DIAGNOSIS — J439 Emphysema, unspecified: Secondary | ICD-10-CM | POA: Diagnosis not present

## 2021-03-25 DIAGNOSIS — E785 Hyperlipidemia, unspecified: Secondary | ICD-10-CM | POA: Diagnosis not present

## 2021-03-25 DIAGNOSIS — Z7901 Long term (current) use of anticoagulants: Secondary | ICD-10-CM | POA: Diagnosis not present

## 2021-03-25 DIAGNOSIS — I10 Essential (primary) hypertension: Secondary | ICD-10-CM

## 2021-03-25 LAB — BASIC METABOLIC PANEL
Anion gap: 6 (ref 5–15)
BUN: 13 mg/dL (ref 8–23)
CO2: 23 mmol/L (ref 22–32)
Calcium: 8.6 mg/dL — ABNORMAL LOW (ref 8.9–10.3)
Chloride: 110 mmol/L (ref 98–111)
Creatinine, Ser: 0.74 mg/dL (ref 0.61–1.24)
GFR, Estimated: >60 mL/min (ref >60)
Glucose, Bld: 162 mg/dL — ABNORMAL HIGH (ref 70–99)
Potassium: 4.1 mmol/L (ref 3.5–5.1)
Sodium: 139 mmol/L (ref 135–145)

## 2021-03-25 LAB — CBC
HCT: 30.9 % — ABNORMAL LOW (ref 39.0–52.0)
Hemoglobin: 9 g/dL — ABNORMAL LOW (ref 13.0–17.0)
MCH: 27.8 pg (ref 26.0–34.0)
MCHC: 29.1 g/dL — ABNORMAL LOW (ref 30.0–36.0)
MCV: 95.4 fL (ref 80.0–100.0)
Platelets: 252 10*3/uL (ref 150–400)
RBC: 3.24 MIL/uL — ABNORMAL LOW (ref 4.22–5.81)
RDW: 21.1 % — ABNORMAL HIGH (ref 11.5–15.5)
WBC: 12 10*3/uL — ABNORMAL HIGH (ref 4.0–10.5)
nRBC: 0 % (ref 0.0–0.2)

## 2021-03-25 NOTE — Progress Notes (Signed)
PROGRESS NOTE    Donald [name]  LDJ:[id number] DOB: Apr 17, 1933 DOA: 03/24/2021 PCP: Virgie Dad, MD    Brief Narrative:  87 year old male with multiple medical problems including COPD, interstitial lung disease, atrial fibrillation on anticoagulation, obstructive sleep apnea, was electively admitted to the hospital by general surgery for right colectomy due to underlying adenocarcinoma of colon.  [facility] hospitalist was consulted to assist with medical management.   Assessment & Plan:   Active Problems:   Essential hypertension   Hyperlipidemia with target LDL less than 100   OSA (obstructive sleep apnea)   Obesity (BMI 30.0-34.9)   PAF (paroxysmal atrial fibrillation) (HCC)   Chronic anticoagulation   Emphysema lung (HCC)   ILD (interstitial lung disease) ([place])   Pacemaker   Colon cancer (HCC)   Adenocarcinoma of the colon -Status post right colectomy -Further postop care per general surgery -Appears to be doing well, tolerating diet, ambulating -Plans to follow-up with oncology for further recommendations regarding management  Paroxysmal atrial fibrillation Sick sinus syndrome status post pacemaker -Appears to be in sinus rhythm -Continue on Tikosyn, diltiazem and metoprolol -Heart rate appears to be stable -We will need to restart on Eliquis once cleared by surgery  COPD Interstitial lung disease Obstructive sleep apnea -Continue CPAP use at night -He does not require oxygen during the day -Currently respiratory status appears to be stable on room air -Continue albuterol as needed  HFpEF -Currently appears compensated -Continued on home dose of Lasix -Continue on Imdur, beta-blockers -Most recent echo shows preserved ejection fraction  Hyperlipidemia -Continue statin  Iron deficiency anemia -Secondary to underlying bowel malignancy -Followed by heme-onc -Hemoglobin appears to be stable -Continue to monitor  Subjective: Denies any shortness of  breath, chest pain or palpitations.  Reports that abdominal pain is reasonably controlled.  Objective: Vitals:   03/25/21 0500 03/25/21 0622 03/25/21 1208 03/25/21 1606  BP:  121/69 (!) 133/59 119/61  Pulse:  60 (!) 59 61  Resp:  18 (!) 22 18  Temp:  98 F (36.7 C) (!) 97.5 F (36.4 C) 97.8 F (36.6 C)  TempSrc:   Oral Oral  SpO2:  100% 100% 100%  Weight: 98.3 kg     Height: 5\' 9"  (1.753 m)       Intake/Output Summary (Last 24 hours) at 03/25/2021 1835 Last data filed at 03/25/2021 1142 Gross per 24 hour  Intake 2637.77 ml  Output 2750 ml  Net -112.23 ml   Filed Weights   03/24/21 0737 03/24/21 1401 03/25/21 0500  Weight: 98.9 kg 99 kg 98.3 kg    Examination:  General exam: Appears calm and comfortable  Respiratory system: Clear to auscultation. Respiratory effort normal. Cardiovascular system: S1 & S2 heard, RRR. No JVD, murmurs, rubs, gallops or clicks. No pedal edema. Gastrointestinal system: Abdomen is nondistended, soft and appropriately tender. No organomegaly or masses felt. Normal bowel sounds heard. Central nervous system: Alert and oriented. No focal neurological deficits. Extremities: Symmetric 5 x 5 power. Skin: No rashes, lesions or ulcers Psychiatry: Judgement and insight appear normal. Mood & affect appropriate.     Data Reviewed: I have personally reviewed following labs and imaging studies  CBC: Recent Labs  Lab 03/25/21 0438  WBC 12.0*  HGB 9.0*  HCT 30.9*  MCV 95.4  PLT 030   Basic Metabolic Panel: Recent Labs  Lab 03/25/21 0438  NA 139  K 4.1  CL 110  CO2 23  GLUCOSE 162*  BUN 13  CREATININE 0.74  CALCIUM 8.6*  GFR: Estimated Creatinine Clearance: 76.6 mL/min (by C-G formula based on SCr of 0.74 mg/dL). Liver Function Tests: No results for input(s): AST, ALT, ALKPHOS, BILITOT, PROT, ALBUMIN in the last 168 hours. No results for input(s): LIPASE, AMYLASE in the last 168 hours. No results for input(s): AMMONIA in the last 168  hours. Coagulation Profile: No results for input(s): INR, PROTIME in the last 168 hours. Cardiac Enzymes: No results for input(s): CKTOTAL, CKMB, CKMBINDEX, TROPONINI in the last 168 hours. BNP (last 3 results) No results for input(s): PROBNP in the last 8760 hours. HbA1C: No results for input(s): HGBA1C in the last 72 hours. CBG: No results for input(s): GLUCAP in the last 168 hours. Lipid Profile: No results for input(s): CHOL, HDL, LDLCALC, TRIG, CHOLHDL, LDLDIRECT in the last 72 hours. Thyroid Function Tests: No results for input(s): TSH, T4TOTAL, FREET4, T3FREE, THYROIDAB in the last 72 hours. Anemia Panel: No results for input(s): VITAMINB12, FOLATE, FERRITIN, TIBC, IRON, RETICCTPCT in the last 72 hours. Sepsis Labs: No results for input(s): PROCALCITON, LATICACIDVEN in the last 168 hours.  Recent Results (from the past 240 hour(s))  SARS Coronavirus 2 by RT PCR (hospital order, performed in [facility] hospital lab) Nasopharyngeal Nasopharyngeal Swab     Status: None   Collection Time: 03/24/21  6:26 AM   Specimen: Nasopharyngeal Swab  Result Value Ref Range Status   SARS Coronavirus 2 NEGATIVE NEGATIVE Final    Comment: (NOTE) SARS-CoV-2 target nucleic acids are NOT DETECTED.  The SARS-CoV-2 RNA is generally detectable in upper and lower respiratory specimens during the acute phase of infection. The lowest concentration of SARS-CoV-2 viral copies this assay can detect is 250 copies / mL. A negative result does not preclude SARS-CoV-2 infection and should not be used as the sole basis for treatment or other patient management decisions.  A negative result may occur with improper specimen collection / handling, submission of specimen other than nasopharyngeal swab, presence of viral mutation(s) within the areas targeted by this assay, and inadequate number of viral copies (<250 copies / mL). A negative result must be combined with clinical observations, patient history,  and epidemiological information.  Fact Sheet for Patients:   StrictlyIdeas.no  Fact Sheet for Healthcare Providers: BankingDealers.co.za  This test is not yet approved or  cleared by the [place] FDA and has been authorized for detection and/or diagnosis of SARS-CoV-2 by FDA under an Emergency Use Authorization (EUA).  This EUA will remain in effect (meaning this test can be used) for the duration of the COVID-19 declaration under Section 564(b)(1) of the Act, 21 U.S.C. section 360bbb-3(b)(1), unless the authorization is terminated or revoked sooner.  Performed at [facility], [place] [address] [place], [place] [address]          Radiology Studies: No results found.      Scheduled Meds:  acetaminophen  1,000 mg Oral Q6H   alvimopan  12 mg Oral BID   Chlorhexidine Gluconate Cloth  6 each Topical Daily   diltiazem  120 mg Oral BID   dofetilide  500 mcg Oral BID   enoxaparin (LOVENOX) injection  40 mg Subcutaneous Q24H   feeding supplement  237 mL Oral BID BM   furosemide  20 mg Oral Daily   gabapentin  300 mg Oral BID   isosorbide mononitrate  60 mg Oral Daily   magnesium oxide  200 mg Oral Daily   metoprolol tartrate  25 mg Oral BID   pantoprazole  40 mg Oral  Daily   saccharomyces boulardii  250 mg Oral BID   Continuous Infusions:   LOS: 1 day    Time spent: 46mins    Kathie Dike, MD [facility]   If 7PM-7AM, please contact night-coverage [email or web address]  03/25/2021, 6:35 PM

## 2021-03-25 NOTE — Evaluation (Signed)
Physical Therapy One Time Evaluation Patient Details Name: Donald [name] MRN: [id number] DOB: [birth year] Today's Date: 03/25/2021  History of Present Illness  87 year old male with a history of cardiomyopathy, coronary artery disease and recent anemia that was diagnosed due to shortness of breath and fatigue.  This was treated and then evaluated with colonoscopy and EGD.  This showed a cecal mass with biopsies showing adenocarcinoma.  Patient has a history of pacemaker placement and is on Eliquis due to atrial fibrillation. Pt s/p robotic right colectomy 03/24/21.  Clinical Impression  Patient evaluated by Physical Therapy with no further acute PT needs identified. All education has been completed and the patient has no further questions. Pt pleasant and eager to mobilize.  Pt ambulating well in hallway and encouraged to ambulate at least 4x daily during hospitalization.  See below for any follow-up Physical Therapy or equipment needs. PT is signing off. Thank you for this referral.       Recommendations for follow up therapy are one component of a multi-disciplinary discharge planning process, led by the attending physician.  Recommendations may be updated based on patient status, additional functional criteria and insurance authorization.  Follow Up Recommendations No PT follow up    Assistance Recommended at Discharge None  Functional Status Assessment Patient has not had a recent decline in their functional status  Equipment Recommendations  None recommended by PT    Recommendations for Other Services       Precautions / Restrictions Precautions Precautions: None      Mobility  Bed Mobility               General bed mobility comments: pt in recliner on arrival; demonstrated and educated on log roll technique while healing    Transfers Overall transfer level: Modified independent                      Ambulation/Gait Ambulation/Gait assistance:  Supervision;Modified independent (Device/Increase time) Gait Distance (Feet): 400 Feet Assistive device: IV Pole Gait Pattern/deviations: WFL(Within Functional Limits)       General Gait Details: no unsteadiness observed, no symptoms reported  Stairs            Wheelchair Mobility    Modified Rankin (Stroke Patients Only)       Balance Overall balance assessment: No apparent balance deficits (not formally assessed)                                           Pertinent Vitals/Pain Pain Assessment: Faces Faces Pain Scale: Hurts a little bit Pain Location: surgical site Pain Descriptors / Indicators: Sore Pain Intervention(s): Repositioned;Monitored during session;Ice applied    Home Living Family/patient expects to be discharged to:: Private residence Living Arrangements: Spouse/significant other   Type of Home: Independent living facility           Home Equipment: None Additional Comments: from [place]    Prior Function Prior Level of Function : Independent/Modified Independent                     Journalist, newspaper        Extremity/Trunk Assessment   Upper Extremity Assessment Upper Extremity Assessment: Overall WFL for tasks assessed    Lower Extremity Assessment Lower Extremity Assessment: Overall WFL for tasks assessed    Cervical / Trunk Assessment Cervical / Trunk Assessment: Normal  Communication   Communication: No difficulties  Cognition Arousal/Alertness: Awake/alert Behavior During Therapy: WFL for tasks assessed/performed Overall Cognitive Status: Within Functional Limits for tasks assessed                                          General Comments      Exercises     Assessment/Plan    PT Assessment Patient does not need any further PT services  PT Problem List         PT Treatment Interventions      PT Goals (Current goals can be found in the Care Plan section)  Acute Rehab  PT Goals PT Goal Formulation: All assessment and education complete, DC therapy    Frequency     Barriers to discharge        Co-evaluation               AM-PAC PT "6 Clicks" Mobility  Outcome Measure Help needed turning from your back to your side while in a flat bed without using bedrails?: A Little Help needed moving from lying on your back to sitting on the side of a flat bed without using bedrails?: A Little Help needed moving to and from a bed to a chair (including a wheelchair)?: A Little Help needed standing up from a chair using your arms (e.g., wheelchair or bedside chair)?: A Little Help needed to walk in hospital room?: A Little Help needed climbing 3-5 steps with a railing? : A Little 6 Click Score: 18    End of Session   Activity Tolerance: Patient tolerated treatment well Patient left: in chair;with call bell/phone within reach Nurse Communication: Mobility status PT Visit Diagnosis: Difficulty in walking, not elsewhere classified (R26.2)    Time: 6283-1517 PT Time Calculation (min) (ACUTE ONLY): 12 min   Charges:   PT Evaluation $PT Eval Low Complexity: 1 Low        Kati PT, DPT Acute Rehabilitation Services Pager: [phone] Office: [place] 03/25/2021, 12:33 PM

## 2021-03-25 NOTE — Progress Notes (Signed)
1 Day Post-Op robotic R colectomy Subjective: Feels well.  Ambulated with PT.  Tolerating a diet.  Pain is min, only taking Tylenol.  No flatus or BM yet, denies chest pain  Objective: Vital signs in last 24 hours: Temp:  [97.6 F (36.4 C)-98 F (36.7 C)] 98 F (36.7 C) (11/10 0622) Pulse Rate:  [58-65] 60 (11/10 0622) Resp:  [11-18] 18 (11/10 0622) BP: (102-143)/(60-77) 121/69 (11/10 0622) SpO2:  [91 %-100 %] 100 % (11/10 0622) Weight:  [98.3 kg-99 kg] 98.3 kg (11/10 0500)   Intake/Output from previous day: 11/09 0701 - 11/10 0700 In: 4630.7 [P.O.:650; I.V.:3780.7; IV Piggyback:200] Out: 3010 [Urine:2960; Blood:50] Intake/Output this shift: Total I/O In: 600 [P.O.:600] Out: -    General appearance: alert and cooperative GI: soft, nontender, nondistended  Incision: no significant drainage, no significant erythema  Lab Results:  Recent Labs    03/25/21 0438  WBC 12.0*  HGB 9.0*  HCT 30.9*  PLT 252   BMET Recent Labs    03/25/21 0438  NA 139  K 4.1  CL 110  CO2 23  GLUCOSE 162*  BUN 13  CREATININE 0.74  CALCIUM 8.6*   PT/INR No results for input(s): LABPROT, INR in the last 72 hours. ABG No results for input(s): PHART, HCO3 in the last 72 hours.  Invalid input(s): PCO2, PO2  MEDS, Scheduled  acetaminophen  1,000 mg Oral Q6H   alvimopan  12 mg Oral BID   Chlorhexidine Gluconate Cloth  6 each Topical Daily   diltiazem  120 mg Oral BID   dofetilide  500 mcg Oral BID   enoxaparin (LOVENOX) injection  40 mg Subcutaneous Q24H   feeding supplement  237 mL Oral BID BM   furosemide  20 mg Oral Daily   gabapentin  300 mg Oral BID   isosorbide mononitrate  60 mg Oral Daily   magnesium oxide  200 mg Oral Daily   metoprolol tartrate  25 mg Oral BID   pantoprazole  40 mg Oral Daily   saccharomyces boulardii  250 mg Oral BID    Studies/Results: No results found.  Assessment: s/p Procedure(s): XI ROBOT ASSISTED LAPAROSCOPIC PARTIAL COLECTOMY WITH  BILATERAL TAP BLOCK AND ITNTRAOPERATIVE ASSESSMENT OF PERFUSION USING FIREFLY Patient Active Problem List   Diagnosis Date Noted   Colon cancer ([place]) 03/24/2021   Angiodysplasia of upper gastrointestinal tract 01/22/2021   CHF (congestive heart failure) ([place]) 01/04/2021   COVID 12/16/2020   Pacemaker 07/05/2019   ILD (interstitial lung disease) ([place]) 04/23/2019   Emphysema lung ([place]) 04/19/2018   Coronary artery disease involving native coronary artery of native heart without angina pectoris 06/08/2017   Tachycardia 06/08/2017   Renal cyst, left 03/08/2016   Blood in stool 06/10/2015   Iron deficiency anemia 04/29/2015   Chronic anticoagulation    Hearing loss 01/20/2015   Esophageal stricture 04/09/2014   PAF (paroxysmal atrial fibrillation) ([place]) 04/08/2014   Atrial flutter ([place]) 03/19/2014   Habitual alcohol use    Obesity (BMI 30.0-34.9)    OSA (obstructive sleep apnea) 02/03/2014   Hyperlipidemia with target LDL less than 100 05/07/2013   Routine health maintenance 04/19/2011   Essential hypertension 03/10/2008   ERECTILE DYSFUNCTION 03/20/2007   ATHEROSCLEROSIS, AORTIC 01/24/2007   Allergic rhinitis due to pollen 01/24/2007    Expected post op course.  Appreciate medical management of this potentially medically complicated patient by [facility]  Plan: Advance diet Cont to ambulate Saline lock IVF's  Paroxysmal A fib: hopefully will be able to  resume Elliquis in AM, cont SQ Lov for now, cont home dose Cardizem and Tikosyn  Anemia: hgb stable currently, will recheck in AM  Htn: resume home meds  ILD, COPD, OSA: cont inhalers, use Cpap at night, O2 PRN    LOS: 1 day     .Rosario Adie, MD [facility], [place]    03/25/2021 10:51 AM

## 2021-03-26 DIAGNOSIS — I10 Essential (primary) hypertension: Secondary | ICD-10-CM | POA: Diagnosis not present

## 2021-03-26 DIAGNOSIS — J849 Interstitial pulmonary disease, unspecified: Secondary | ICD-10-CM | POA: Diagnosis not present

## 2021-03-26 DIAGNOSIS — E785 Hyperlipidemia, unspecified: Secondary | ICD-10-CM | POA: Diagnosis not present

## 2021-03-26 DIAGNOSIS — Z7901 Long term (current) use of anticoagulants: Secondary | ICD-10-CM | POA: Diagnosis not present

## 2021-03-26 DIAGNOSIS — J439 Emphysema, unspecified: Secondary | ICD-10-CM | POA: Diagnosis not present

## 2021-03-26 LAB — BASIC METABOLIC PANEL
Anion gap: 7 (ref 5–15)
BUN: 22 mg/dL (ref 8–23)
CO2: 24 mmol/L (ref 22–32)
Calcium: 8.6 mg/dL — ABNORMAL LOW (ref 8.9–10.3)
Chloride: 109 mmol/L (ref 98–111)
Creatinine, Ser: 0.94 mg/dL (ref 0.61–1.24)
GFR, Estimated: >60 mL/min (ref >60)
Glucose, Bld: 125 mg/dL — ABNORMAL HIGH (ref 70–99)
Potassium: 5.3 mmol/L — ABNORMAL HIGH (ref 3.5–5.1)
Sodium: 140 mmol/L (ref 135–145)

## 2021-03-26 LAB — CBC
HCT: 27.2 % — ABNORMAL LOW (ref 39.0–52.0)
HCT: 25.7 % — ABNORMAL LOW (ref 39.0–52.0)
Hemoglobin: 8.2 g/dL — ABNORMAL LOW (ref 13.0–17.0)
Hemoglobin: 7.7 g/dL — ABNORMAL LOW (ref 13.0–17.0)
MCH: 27.7 pg (ref 26.0–34.0)
MCH: 27.6 pg (ref 26.0–34.0)
MCHC: 30.1 g/dL (ref 30.0–36.0)
MCHC: 30 g/dL (ref 30.0–36.0)
MCV: 91.9 fL (ref 80.0–100.0)
MCV: 92.1 fL (ref 80.0–100.0)
Platelets: 250 10*3/uL (ref 150–400)
Platelets: 316 10*3/uL (ref 150–400)
RBC: 2.96 MIL/uL — ABNORMAL LOW (ref 4.22–5.81)
RBC: 2.79 MIL/uL — ABNORMAL LOW (ref 4.22–5.81)
RDW: 21.4 % — ABNORMAL HIGH (ref 11.5–15.5)
RDW: 21.6 % — ABNORMAL HIGH (ref 11.5–15.5)
WBC: 14.3 10*3/uL — ABNORMAL HIGH (ref 4.0–10.5)
WBC: 16.2 10*3/uL — ABNORMAL HIGH (ref 4.0–10.5)
nRBC: 0 % (ref 0.0–0.2)
nRBC: 0 % (ref 0.0–0.2)

## 2021-03-26 MED ORDER — APIXABAN 5 MG PO TABS
5.0000 mg | ORAL_TABLET | Freq: Two times a day (BID) | ORAL | Status: DC
  Administered 2021-03-26: 5 mg via ORAL
  Filled 2021-03-26: qty 1

## 2021-03-26 MED ORDER — LACTATED RINGERS IV BOLUS
1000.0000 mL | Freq: Once | INTRAVENOUS | Status: AC
  Administered 2021-03-26: 1000 mL via INTRAVENOUS

## 2021-03-26 MED ORDER — SODIUM CHLORIDE 0.9 % IV SOLN
25.0000 mg | Freq: Once | INTRAVENOUS | Status: AC
  Administered 2021-03-26: 25 mg via INTRAVENOUS
  Filled 2021-03-26: qty 0.5

## 2021-03-26 MED ORDER — LACTATED RINGERS IV BOLUS: 1000.0000 mL | Freq: Three times a day (TID) | INTRAVENOUS | Status: DC | PRN

## 2021-03-26 MED ORDER — SODIUM CHLORIDE 0.9 % IV SOLN
500.0000 mg | Freq: Once | INTRAVENOUS | Status: AC
  Administered 2021-03-26: 500 mg via INTRAVENOUS
  Filled 2021-03-26: qty 10

## 2021-03-26 MED ORDER — FERROUS SULFATE 325 (65 FE) MG PO TABS
325.0000 mg | ORAL_TABLET | Freq: Every day | ORAL | Status: DC
  Administered 2021-03-26 – 2021-03-28 (×3): 325 mg via ORAL
  Filled 2021-03-26 (×3): qty 1

## 2021-03-26 NOTE — Plan of Care (Signed)
  Problem: Skin Integrity: Goal: Demonstration of wound healing without infection will improve Outcome: Progressing   Problem: Clinical Measurements: Goal: Ability to maintain clinical measurements within normal limits will improve Outcome: Progressing   Problem: Clinical Measurements: Goal: Will remain free from infection Outcome: Progressing   Problem: Activity: Goal: Risk for activity intolerance will decrease Outcome: Progressing   Problem: Nutrition: Goal: Adequate nutrition will be maintained Outcome: Progressing   Problem: Elimination: Goal: Will not experience complications related to bowel motility Outcome: Progressing Goal: Will not experience complications related to urinary retention Outcome: Progressing   Problem: Pain Managment: Goal: General experience of comfort will improve Outcome: Progressing

## 2021-03-26 NOTE — Discharge Instructions (Signed)
SURGERY: POST OP INSTRUCTIONS (Surgery for small bowel obstruction, colon resection, etc)   ######################################################################  EAT Gradually transition to a high fiber diet with a fiber supplement over the next few days after discharge  WALK Walk an hour a day.  Control your pain to do that.    CONTROL PAIN Control pain so that you can walk, sleep, tolerate sneezing/coughing, go up/down stairs.  HAVE A BOWEL MOVEMENT DAILY Keep your bowels regular to avoid problems.  OK to try a laxative to override constipation.  OK to use an antidairrheal to slow down diarrhea.  Call if not better after 2 tries  CALL IF YOU HAVE PROBLEMS/CONCERNS Call if you are still struggling despite following these instructions. Call if you have concerns not answered by these instructions  ######################################################################   DIET Follow a light diet the first few days at home.  Start with a bland diet such as soups, liquids, starchy foods, low fat foods, etc.  If you feel full, bloated, or constipated, stay on a ful liquid or pureed/blenderized diet for a few days until you feel better and no longer constipated. Be sure to drink plenty of fluids every day to avoid getting dehydrated (feeling dizzy, not urinating, etc.). Gradually add a fiber supplement to your diet over the next week.  Gradually get back to a regular solid diet.  Avoid fast food or heavy meals the first week as you are more likely to get nauseated. It is expected for your digestive tract to need a few months to get back to normal.  It is common for your bowel movements and stools to be irregular.  You will have occasional bloating and cramping that should eventually fade away.  Until you are eating solid food normally, off all pain medications, and back to regular activities; your bowels will not be normal. Focus on eating a low-fat, high fiber diet the rest of your life  (See Getting to Good Bowel Health, below).  CARE of your INCISION or WOUND  It is good for closed incisions and even open wounds to be washed every day.  Shower every day.  Short baths are fine.  Wash the incisions and wounds clean with soap & water.    You may leave closed incisions open to air if it is dry.   You may cover the incision with clean gauze & replace it after your daily shower for comfort.  STAPLES: You have skin staples.  Leave them in place & set up an appointment for them to be removed by a surgery office nurse ~10 days after surgery. = 1st week of January 2024    ACTIVITIES as tolerated Start light daily activities --- self-care, walking, climbing stairs-- beginning the day after surgery.  Gradually increase activities as tolerated.  Control your pain to be active.  Stop when you are tired.  Ideally, walk several times a day, eventually an hour a day.   Most people are back to most day-to-day activities in a few weeks.  It takes 4-8 weeks to get back to unrestricted, intense activity. If you can walk 30 minutes without difficulty, it is safe to try more intense activity such as jogging, treadmill, bicycling, low-impact aerobics, swimming, etc. Save the most intensive and strenuous activity for last (Usually 4-8 weeks after surgery) such as sit-ups, heavy lifting, contact sports, etc.  Refrain from any intense heavy lifting or straining until you are off narcotics for pain control.  You will have off days, but things should improve   week-by-week. DO NOT PUSH THROUGH PAIN.  Let pain be your guide: If it hurts to do something, don't do it.  Pain is your body warning you to avoid that activity for another week until the pain goes down. You may drive when you are no longer taking narcotic prescription pain medication, you can comfortably wear a seatbelt, and you can safely make sudden turns/stops to protect yourself without hesitating due to pain. You may have sexual intercourse when it  is comfortable. If it hurts to do something, stop.  MEDICATIONS Take your usually prescribed home medications unless otherwise directed.   Blood thinners:  Usually you can restart any strong blood thinners after the second postoperative day.  It is OK to take aspirin right away.     If you are on strong blood thinners (warfarin/Coumadin, Plavix, Xerelto, Eliquis, Pradaxa, etc), discuss with your surgeon, medicine PCP, and/or cardiologist for instructions on when to restart the blood thinner & if blood monitoring is needed (PT/INR blood check, etc).     PAIN CONTROL Pain after surgery or related to activity is often due to strain/injury to muscle, tendon, nerves and/or incisions.  This pain is usually short-term and will improve in a few months.  To help speed the process of healing and to get back to regular activity more quickly, DO THE FOLLOWING THINGS TOGETHER: Increase activity gradually.  DO NOT PUSH THROUGH PAIN Use Ice and/or Heat Try Gentle Massage and/or Stretching Take over the counter pain medication Take Narcotic prescription pain medication for more severe pain  Good pain control = faster recovery.  It is better to take more medicine to be more active than to stay in bed all day to avoid medications.  Increase activity gradually Avoid heavy lifting at first, then increase to lifting as tolerated over the next 6 weeks. Do not "push through" the pain.  Listen to your body and avoid positions and maneuvers than reproduce the pain.  Wait a few days before trying something more intense Walking an hour a day is encouraged to help your body recover faster and more safely.  Start slowly and stop when getting sore.  If you can walk 30 minutes without stopping or pain, you can try more intense activity (running, jogging, aerobics, cycling, swimming, treadmill, sex, sports, weightlifting, etc.) Remember: If it hurts to do it, then don't do it! Use Ice and/or Heat You will have swelling and  bruising around the incisions.  This will take several weeks to resolve. Ice packs or heating pads (6-8 times a day, 30-60 minutes at a time) will help sooth soreness & bruising. Some people prefer to use ice alone, heat alone, or alternate between ice & heat.  Experiment and see what works best for you.  Consider trying ice for the first few days to help decrease swelling and bruising; then, switch to heat to help relax sore spots and speed recovery. Shower every day.  Short baths are fine.  It feels good!  Keep the incisions and wounds clean with soap & water.   Try Gentle Massage and/or Stretching Massage at the area of pain many times a day Stop if you feel pain - do not overdo it Take over the counter pain medication This helps the muscle and nerve tissues become less irritable and calm down faster Choose ONE of the following over-the-counter anti-inflammatory medications: Acetaminophen 500mg tabs (Tylenol) 1-2 pills with every meal and just before bedtime (avoid if you have liver problems or if you have   acetaminophen in you narcotic prescription) Naproxen 220mg tabs (ex. Aleve, Naprosyn) 1-2 pills twice a day (avoid if you have kidney, stomach, IBD, or bleeding problems) Ibuprofen 200mg tabs (ex. Advil, Motrin) 3-4 pills with every meal and just before bedtime (avoid if you have kidney, stomach, IBD, or bleeding problems) Take with food/snack several times a day as directed for at least 2 weeks to help keep pain / soreness down & more manageable. Take Narcotic prescription pain medication for more severe pain A prescription for strong pain control is often given to you upon discharge (for example: oxycodone/Percocet, hydrocodone/Norco/Vicodin, or tramadol/Ultram) Take your pain medication as prescribed. Be mindful that most narcotic prescriptions contain Tylenol (acetaminophen) as well - avoid taking too much Tylenol. If you are having problems/concerns with the prescription medicine (does  not control pain, nausea, vomiting, rash, itching, etc.), please call us [phone] to see if we need to switch you to a different pain medicine that will work better for you and/or control your side effects better. If you need a refill on your pain medication, you must call the office before 4 pm and on weekdays only.  By federal law, prescriptions for narcotics cannot be called into a pharmacy.  They must be filled out on paper & picked up from our office by the patient or authorized caretaker.  Prescriptions cannot be filled after 4 pm nor on weekends.    WHEN TO CALL US [phone] Severe uncontrolled or worsening pain  Fever over 101 F (38.5 C) Concerns with the incision: Worsening pain, redness, rash/hives, swelling, bleeding, or drainage Reactions / problems with new medications (itching, rash, hives, nausea, etc.) Nausea and/or vomiting Difficulty urinating Difficulty breathing Worsening fatigue, dizziness, lightheadedness, blurred vision Other concerns If you are not getting better after two weeks or are noticing you are getting worse, contact our office [phone] for further advice.  We may need to adjust your medications, re-evaluate you in the office, send you to the emergency room, or see what other things we can do to help. The clinic staff is available to answer your questions during regular business hours (8:30am-5pm).  Please don't hesitate to call and ask to speak to one of our nurses for clinical concerns.    A surgeon from [facility] is always on call at the hospitals 24 hours/day If you have a medical emergency, go to the nearest emergency room or call 911.  FOLLOW UP in our office One the day of your discharge from the hospital (or the next business weekday), please call [facility] to set up or confirm an appointment to see your surgeon in the office for a follow-up appointment.  Usually it is 2-3 weeks after your surgery.   If you  have skin staples at your incision(s), let the office know so we can set up a time in the office for the nurse to remove them (usually around 10 days after surgery). Make sure that you call for appointments the day of discharge (or the next business weekday) from the hospital to ensure a convenient appointment time. IF YOU HAVE DISABILITY OR FAMILY LEAVE FORMS, BRING THEM TO THE OFFICE FOR PROCESSING.  DO NOT GIVE THEM TO YOUR DOCTOR.  [facility] [address], [address], [place],   [address] ? [phone] - Main [phone] - Toll Free,  [phone] - Fax [email or web address]    GETTING TO GOOD BOWEL HEALTH. It is expected for your digestive tract to   need a few months to get back to normal.  It is common for your bowel movements and stools to be irregular.  You will have occasional bloating and cramping that should eventually fade away.  Until you are eating solid food normally, off all pain medications, and back to regular activities; your bowels will not be normal.   Avoiding constipation The goal: ONE SOFT BOWEL MOVEMENT A DAY!    Drink plenty of fluids.  Choose water first. TAKE A FIBER SUPPLEMENT EVERY DAY THE REST OF YOUR LIFE During your first week back home, gradually add back a fiber supplement every day Experiment which form you can tolerate.   There are many forms such as powders, tablets, wafers, gummies, etc Psyllium bran (Metamucil), methylcellulose (Citrucel), Miralax or Glycolax, Benefiber, Flax Seed.  Adjust the dose week-by-week (1/2 dose/day to 6 doses a day) until you are moving your bowels 1-2 times a day.  Cut back the dose or try a different fiber product if it is giving you problems such as diarrhea or bloating. Sometimes a laxative is needed to help jump-start bowels if constipated until the fiber supplement can help regulate your bowels.  If you are tolerating eating & you are farting, it is okay to try a gentle  laxative such as double dose MiraLax, prune juice, or Milk of Magnesia.  Avoid using laxatives too often. Stool softeners can sometimes help counteract the constipating effects of narcotic pain medicines.  It can also cause diarrhea, so avoid using for too long. If you are still constipated despite taking fiber daily, eating solids, and a few doses of laxatives, call our office. Controlling diarrhea Try drinking liquids and eating bland foods for a few days to avoid stressing your intestines further. Avoid dairy products (especially milk & ice cream) for a short time.  The intestines often can lose the ability to digest lactose when stressed. Avoid foods that cause gassiness or bloating.  Typical foods include beans and other legumes, cabbage, broccoli, and dairy foods.  Avoid greasy, spicy, fast foods.  Every person has some sensitivity to other foods, so listen to your body and avoid those foods that trigger problems for you. Probiotics (such as active yogurt, Align, etc) may help repopulate the intestines and colon with normal bacteria and calm down a sensitive digestive tract Adding a fiber supplement gradually can help thicken stools by absorbing excess fluid and retrain the intestines to act more normally.  Slowly increase the dose over a few weeks.  Too much fiber too soon can backfire and cause cramping & bloating. It is okay to try and slow down diarrhea with a few doses of antidiarrheal medicines.   Bismuth subsalicylate (ex. Kayopectate, Pepto Bismol) for a few doses can help control diarrhea.  Avoid if pregnant.   Loperamide (Imodium) can slow down diarrhea.  Start with one tablet (2mg) first.  Avoid if you are having fevers or severe pain.  ILEOSTOMY PATIENTS WILL HAVE CHRONIC DIARRHEA since their colon is not in use.    Drink plenty of liquids.  You will need to drink even more glasses of water/liquid a day to avoid getting dehydrated. Record output from your ileostomy.  Expect to empty  the bag every 3-4 hours at first.  Most people with a permanent ileostomy empty their bag 4-6 times at the least.   Use antidiarrheal medicine (especially Imodium) several times a day to avoid getting dehydrated.  Start with a dose at bedtime & breakfast.  Adjust up or   down as needed.  Increase antidiarrheal medications as directed to avoid emptying the bag more than 8 times a day (every 3 hours). Work with your wound ostomy nurse to learn care for your ostomy.  See ostomy care instructions. TROUBLESHOOTING IRREGULAR BOWELS 1) Start with a soft & bland diet. No spicy, greasy, or fried foods.  2) Avoid gluten/wheat or dairy products from diet to see if symptoms improve. 3) Miralax 17gm or flax seed mixed in 8oz. water or juice-daily. May use 2-4 times a day as needed. 4) Gas-X, Phazyme, etc. as needed for gas & bloating.  5) Prilosec (omeprazole) over-the-counter as needed 6)  Consider probiotics (Align, Activa, etc) to help calm the bowels down  Call your doctor if you are getting worse or not getting better.  Sometimes further testing (cultures, endoscopy, X-ray studies, CT scans, bloodwork, etc.) may be needed to help diagnose and treat the cause of the diarrhea. [facility] [address], [address], [place], [place]  [address] [phone] - Main.    [phone]  - Toll Free.   [phone] - Fax [email or web address]   ###############################   #######################################################  Ostomy Support Information  You've heard that people get along just fine with only one of their eyes, or one of their lungs, or one of their kidneys. But you also know that you have only one intestine and only one bladder, and that leaves you feeling awfully empty, both physically and emotionally: You think no other people go around without part of their intestine with the ends of their intestines sticking out through their abdominal walls.    YOU ARE NOT ALONE.  There are nearly three quarters of a million people in the [place] who have an ostomy; people who have had surgery to remove all or part of their colons or bladders.   There is even a national association, the [employer] with over 350 local affiliated support groups that are organized by volunteers who provide peer support and counseling. [employer] has a toll free telephone num-ber, 800-826-0826 and an educational, interactive website, www.ostomy.org   An ostomy is an opening in the belly (abdominal wall) made by surgery. Ostomates are people who have had this procedure. The opening (stoma) allows the kidney or bowel to grdischarge waste. An external pouch covers the stoma to collect waste. Pouches are are a simple bag and are odor free. Different companies have disposable or reusable pouches to fit one's lifestyle. An ostomy can either be temporary or permanent.   THERE ARE THREE MAIN TYPES OF OSTOMIES Colostomy. A colostomy is a surgically created opening in the large intestine (colon). Ileostomy. An ileostomy is a surgically created opening in the small intestine. Urostomy. A urostomy is a surgically created opening to divert urine away from the bladder.  OSTOMY Care  The following guidelines will make care of your colostomy easier. Keep this information close by for quick reference.  Helpful DIET hints Eat a well-balanced diet including vegetables and fresh fruits. Eat on a regular schedule.  Drink at least 6 to 8 glasses of fluids daily. Eat slowly in a relaxed atmosphere. Chew your food thoroughly. Avoid chewing gum, smoking, and drinking from a straw. This will help decrease the amount of air you swallow, which may help reduce gas. Eating yogurt or drinking buttermilk may help reduce gas.  To control gas at night, do not eat after 8 p.m. This will give your bowel time to quiet down before you go   to bed.  If gas is a problem, you can purchase  Beano. Sprinkle Beano on the first bite of food before eating to reduce gas. It has no flavor and should not change the taste of your food. You can buy Beano over the counter at your local drugstore.  Foods like fish, onions, garlic, broccoli, asparagus, and cabbage produce odor. Although your pouch is odor-proof, if you eat these foods you may notice a stronger odor when emptying your pouch. If this is a concern, you may want to limit these foods in your diet.  If you have an ileostomy, you will have chronic diarrhea & need to drink more liquids to avoid getting dehydrated.  Consider antidiarrheal medicine like imodium (loperamide) or Lomotil to help slow down bowel movements / diarrhea into your ileostomy bag.  GETTING TO GOOD BOWEL HEALTH WITH AN ILEOSTOMY    With the colon bypassed & not in use, you will have small bowel diarrhea.   It is important to thicken & slow your bowel movements down.   The goal: 4-6 small BOWEL MOVEMENTS A DAY It is important to drink plenty of liquids to avoid getting dehydrated  CONTROLLING ILEOSTOMY DIARRHEA  TAKE A FIBER SUPPLEMENT (FiberCon or Benefiner soluble fiber) twice a day - to thicken stools by absorbing excess fluid and retrain the intestines to act more normally.  Slowly increase the dose over a few weeks.  Too much fiber too soon can backfire and cause cramping & bloating.  TAKE AN IRON SUPPLEMENT twice a day to naturally constipate your bowels.  Usually ferrous sulfate 325mg twice a day)  TAKE ANTI-DIARRHEAL MEDICINES: Loperamide (Imodium) can slow down diarrhea.  Start with two tablets (= 4mg) first and then try one tablet every 6 hours.  Can go up to 2 pills four times day (8 pills of 2mg max) Avoid if you are having fevers or severe pain.  If you are not better or start feeling worse, stop all medicines and call your doctor for advice LoMotil (Diphenoxylate / Atropine) is another medicine that can constipate & slow down bowel moevements Pepto  Bismol (bismuth) can gently thicken bowels as well  If diarrhea is worse,: drink plenty of liquids and try simpler foods for a few days to avoid stressing your intestines further. Avoid dairy products (especially milk & ice cream) for a short time.  The intestines often can lose the ability to digest lactose when stressed. Avoid foods that cause gassiness or bloating.  Typical foods include beans and other legumes, cabbage, broccoli, and dairy foods.  Every person has some sensitivity to other foods, so listen to our body and avoid those foods that trigger problems for you.Call your doctor if you are getting worse or not better.  Sometimes further testing (cultures, endoscopy, X-ray studies, bloodwork, etc) may be needed to help diagnose and treat the cause of the diarrhea. Take extra anti-diarrheal medicines (maximum is 8 pills of 2mg loperamide a day)   Tips for POUCHING an OSTOMY   Changing Your Pouch The best time to change your pouch is in the morning, before eating or drinking anything. Your stoma can function at any time, but it will function more after eating or drinking.   Applying the pouching system  Place all your equipment close at hand before removing your pouch.  Wash your hands.  Stand or sit in front of a mirror. Use the position that works best for you. Remember that you must keep the skin around the stoma   wrinkle-free for a good seal.  Gently remove the used pouch (1-piece system) or the pouch and old wafer (2-piece system). Empty the pouch into the toilet. Save the closure clip to use again.  Wash the stoma itself and the skin around the stoma. Your stoma may bleed a little when being washed. This is normal. Rinse and pat dry. You may use a wash cloth or soft paper towels (like Bounty), mild soap (like Dial, Safeguard, or Ivory), and water. Avoid soaps that contain perfumes or lotions.  For a new pouch (1-piece system) or a new wafer (2-piece system), measure your  stoma using the stoma guide in each box of supplies.  Trace the shape of your stoma onto the back of the new pouch or the back of the new wafer. Cut out the opening. Remove the paper backing and set it aside.  Optional: Apply a skin barrier powder to surrounding skin if it is irritated (bare or weeping), and dust off the excess. Optional: Apply a skin-prep wipe (such as Skin Prep or All-Kare) to the skin around the stoma, and let it dry. Do not apply this solution if the skin is irritated (red, tender, or broken) or if you have shaved around the stoma. Optional: Apply a skin barrier paste (such as Stomahesive, Coloplast, or Premium) around the opening cut in the back of the pouch or wafer. Allow it to dry for 30 to 60 seconds.  Hold the pouch (1-piece system) or wafer (2-piece system) with the sticky side toward your body. Make sure the skin around the stoma is wrinkle-free. Center the opening on the stoma, then press firmly to your abdomen (Fig. 4). Look in the mirror to check if you are placing the pouch, or wafer, in the right position. For a 2-piece system, snap the pouch onto the wafer. Make sure it snaps into place securely.  Place your hand over the stoma and the pouch or wafer for about 30 seconds. The heat from your hand can help the pouch or wafer stick to your skin.  Add deodorant (such as Super Banish or Nullo) to your pouch. Other options include food extracts such as vanilla oil and peppermint extract. Add about 10 drops of the deodorant to the pouch. Then apply the closure clamp. Note: Do not use toxic  chemicals or commercial cleaning agents in your pouch. These substances may harm the stoma.  Optional: For extra seal, apply tape to all 4 sides around the pouch or wafer, as if you were framing a picture. You may use any brand of medical adhesive tape. Change your pouch every 5 to 7 days. Change it immediately if a leak occurs.  Wash your hands afterwards.  If you are wearing a  2-piece system, you may use 2 new pouches per week and alternate them. Rinse the pouch with mild soap and warm water and hang it to dry for the next day. Apply the fresh pouch. Alternate the 2 pouches like this for a week. After a week, change the wafer and begin with 2 new pouches. Place the old pouches in a plastic bag, and put them in the trash.   LIVING WITH AN OSTOMY  Emptying Your Pouch Empty your pouch when it is one-third full (of urine, stool, and/or gas). If you wait until your pouch is fuller than this, it will be more difficult to empty and more noticeable. When you empty your pouch, either put toilet paper in the toilet bowl first, or flush the   toilet while you empty the pouch. This will reduce splashing. You can empty the pouch between your legs or to one side while sitting, or while standing or stooping. If you have a 2-piece system, you can snap off the pouch to empty it. Remember that your stoma may function during this time. If you wish to rinse your pouch after you empty it, a turkey baster can be helpful. When using a baster, squirt water up into the pouch through the opening at the bottom. With a 2-piece system, you can snap off the pouch to rinse it. After rinsing  your pouch, empty it into the toilet. When rinsing your pouch at home, put a few granules of Dreft soap in the rinse water. This helps lubricate and freshen your pouch. The inside of your pouch can be sprayed with non-stick cooking oil (Pam spray). This may help reduce stool sticking to the inside of the pouch.  Bathing You may shower or bathe with your pouch on or off. Remember that your stoma may function during this time.  The materials you use to wash your stoma and the skin around it should be clean, but they do not need to be sterile.  Wearing Your Pouch During hot weather, or if you perspire a lot in general, wear a cover over your pouch. This may prevent a rash on your skin under the pouch. Pouch covers are  sold at ostomy supply stores. Wear the pouch inside your underwear for better support. Watch your weight. Any gain or loss of 10 to 15 pounds or more can change the way your pouch fits.  Going Away From Home A collapsible cup (like those that come in travel kits) or a soft plastic squirt bottle with a pull-up top (like a travel bottle for shampoo) can be used for rinsing your pouch when you are away from home. Tilt the opening of the pouch at an upward angle when using a cup to rinse.  Carry wet wipes or extra tissues to use in public bathrooms.  Carry an extra pouching system with you at all times.  Never keep ostomy supplies in the glove compartment of your car. Extreme heat or cold can damage the skin barriers and adhesive wafers on the pouch.  When you travel, carry your ostomy supplies with you at all times. Keep them within easy reach. Do not pack ostomy supplies in baggage that will be checked or otherwise separated from you, because your baggage might be lost. If you're traveling out of the country, it is helpful to have a letter stating that you are carrying ostomy supplies as a medical necessity.  If you need ostomy supplies while traveling, look in the yellow pages of the telephone book under "Surgical Supplies." Or call the local ostomy organization to find out where supplies are available.  Do not let your ostomy supplies get low. Always order new pouches before you use the last one.  Reducing Odor Limit foods such as broccoli, cabbage, onions, fish, and garlic in your diet to help reduce odor. Each time you empty your pouch, carefully clean the opening of the pouch, both inside and outside, with toilet paper. Rinse your pouch 1 or 2 times daily after you empty it (see directions for emptying your pouch and going away from home). Add deodorant (such as Super Banish or Nullo) to your pouch. Use air deodorizers in your bathroom. Do not add aspirin to your pouch. Even though  aspirin can help prevent odor, it   could cause ulcers on your stoma.  When to call the doctor Call the doctor if you have any of the following symptoms: Purple, black, or white stoma Severe cramps lasting more than 6 hours Severe watery discharge from the stoma lasting more than 6 hours No output from the colostomy for 3 days Excessive bleeding from your stoma Swelling of your stoma to more than 1/2-inch larger than usual Pulling inward of your stoma below skin level Severe skin irritation or deep ulcers Bulging or other changes in your abdomen  When to call your ostomy nurse Call your ostomy/enterostomal therapy (WOCN) nurse if any of the following occurs: Frequent leaking of your pouching system Change in size or appearance of your stoma, causing discomfort or problems with your pouch Skin rash or rawness Weight gain or loss that causes problems with your pouch     FREQUENTLY ASKED QUESTIONS   Why haven't you met any of these folks who have an ostomy?  Well, maybe you have! You just did not recognize them because an ostomy doesn't show. It can be kept secret if you wish. Why, maybe some of your best friends, office associates or neighbors have an ostomy ... you never can tell. People facing ostomy surgery have many quality-of-life questions like: Will you bulge? Smell? Make noises? Will you feel waste leaving your body? Will you be a captive of the toilet? Will you starve? Be a social outcast? Get/stay married? Have babies? Easily bathe, go swimming, bend over?  OK, let's look at what you can expect:   Will you bulge?  Remember, without part of the intestine or bladder, and its contents, you should have a flatter tummy than before. You can expect to wear, with little exception, what you wore before surgery ... and this in-cludes tight clothing and bathing suits.   Will you smell?  Today, thanks to modern odor proof pouching systems, you can walk into an ostomy support group  meeting and not smell anything that is foul or offensive. And, for those with an ileostomy or colostomy who are concerned about odor when emptying their pouch, there are in-pouch deodorants that can be used to eliminate any waste odors that may exist.   Will you make noises?  Everyone produces gas, especially if they are an air-swallower. But intestinal sounds that occur from time to time are no differ-ent than a gurgling tummy, and quite often your clothing will muffle any sounds.   Will you feel the waste discharges?  For those with a colostomy or ileostomy there might be a slight pressure when waste leaves your body, but understand that the intestines have no nerve endings, so there will be no unpleasant sensations. Those with a urostomy will probably be unaware of any kidney drainage.   Will you be a captive of the toilet?  Immediately post-op you will spend more time in the bathroom than you will after your body recovers from surgery. Every person is different, but on average those with an ileostomy or urostomy may empty their pouches 4 to 6 times a day; a little  less if you have a colostomy. The average wear time between pouch system changes is 3 to 5 days and the changing process should take less than 30 minutes.   Will I need to be on a special diet? Most people return to their normal diet when they have recovered from surgery. Be sure to chew your food well, eat a well-balanced diet and drink plenty of fluids. If   you experience problems with a certain food, wait a couple of weeks and try it again.  Will there be odor and noises? Pouching systems are designed to be odor-proof or odor-resistant. There are deodorants that can be used in the pouch. Medications are also available to help reduce odor. Limit gas-producing foods and carbonated beverages. You will experience less gas and fewer noises as you heal from surgery.  How much time will it take to care for my ostomy? At first, you may  spend a lot of time learning about your ostomy and how to take care of it. As you become more comfortable and skilled at changing the pouching system, it will take very little time to care for it.   Will I be able to return to work? People with ostomies can perform most jobs. As soon as you have healed from surgery, you should be able to return to work. Heavy lifting (more than 10 pounds) may be discouraged.   What about intimacy? Sexual relationships and intimacy are important and fulfilling aspects of your life. They should continue after ostomy surgery. Intimacy-related concerns should be discussed openly between you and your partner.   Can I wear regular clothing? You do not need to wear special clothing. Ostomy pouches are fairly flat and barely noticeable. Elastic undergarments will not hurt the stoma or prevent the ostomy from functioning.   Can I participate in sports? An ostomy should not limit your involvement in sports. Many people with ostomies are runners, skiers, swimmers or participate in other active lifestyles. Talk with your caregiver first before doing heavy physical activity.  Will you starve?  Not if you follow doctor's orders at each stage of your post-op adjustment. There is no such thing as an "ostomy diet". Some people with an ostomy will be able to eat and tolerate anything; others may find diffi-culty with some foods. Each person is an individual and must determine, by trial, what is best for them. A good practice for all is to drink plenty of water.   Will you be a social outcast?  Have you met anyone who has an ostomy and is a social outcast? Why should you be the first? Only your attitude and self image will effect how you are treated. No confi-dent person is an outcast.    PROFESSIONAL HELP   Resources are available if you need help or have questions about your ostomy.   Specially trained nurses called Wound, Ostomy Continence Nurses (WOCN) are available for  consultation in most major medical centers.  Consider getting an ostomy consult at an outpatient ostomy clinic.   [facility] has an Ostomy Clinic run by an WOCN ostomy nurse at the [facility].  [phone]. [facility] can help set up an appointment   The United Ostomy Association (UOA) is a group made up of many local chapters throughout the [place]. These local groups hold meetings and provide support to prospective and existing ostomates. They sponsor educational events and have qualified visitors to make personal or telephone visits. Contact the UOA for the chapter nearest you and for other educational publications.  More detailed information can be found in Colostomy Guide, a publication of the United Ostomy Association (UOA). Contact UOA at 1-800-826-0826 or visit their web site at www.uoaa.org. The website contains links to other sites, suppliers and resources.  [employer] Secure Start Services: Start at the website to enlist for support.  Your Wound Ostomy (WOCN) nurse may have started this   process. [email or web address] Secure Start services are designed to support people as they live their lives with an ostomy or neurogenic bladder. Enrolling is easy and at no cost to the patient. We realize that each person's needs and life journey are different. Through Secure Start services, we want to help people live their life, their way.  #######################################################  

## 2021-03-26 NOTE — Progress Notes (Signed)
Pt has had 3 additional bright red moderate liquid BM's.  Reports feeling "swimmy headed".  BP 106/59, pulse 60.  Pt has been lying in bed since initial note at 1405 other than bathroom trips.  PA notified and orders received for [facility]. Andre Lefort

## 2021-03-26 NOTE — Progress Notes (Signed)
2 Days Post-Op robotic R colectomy Subjective: Feels well.  Ambulated with PT.  Tolerating a diet.  Pain is min, only taking Tylenol.  Had a BM, no blood in stool per pt.  denies chest pain  Objective: Vital signs in last 24 hours: Temp:  [97.5 F (36.4 C)-97.9 F (36.6 C)] 97.9 F (36.6 C) (11/11 0526) Pulse Rate:  [59-62] 62 (11/11 0526) Resp:  [16-22] 18 (11/11 0526) BP: (109-133)/(59-69) 132/69 (11/11 0526) SpO2:  [95 %-100 %] 95 % (11/11 0526) Weight:  [99.2 kg-101.1 kg] 99.2 kg (11/11 0545)   Intake/Output from previous day: 11/10 0701 - 11/11 0700 In: 1387.1 [P.O.:960; I.V.:427.1] Out: 1950 [Urine:1950] Intake/Output this shift: No intake/output data recorded.   General appearance: alert and cooperative GI: soft, nontender, nondistended  Incision: no significant drainage, no significant erythema  Lab Results:  Recent Labs    03/25/21 0438 03/26/21 0440  WBC 12.0* 14.3*  HGB 9.0* 8.2*  HCT 30.9* 27.2*  PLT 252 250    BMET Recent Labs    03/25/21 0438 03/26/21 0440  NA 139 140  K 4.1 5.3*  CL 110 109  CO2 23 24  GLUCOSE 162* 125*  BUN 13 22  CREATININE 0.74 0.94  CALCIUM 8.6* 8.6*    PT/INR No results for input(s): LABPROT, INR in the last 72 hours. ABG No results for input(s): PHART, HCO3 in the last 72 hours.  Invalid input(s): PCO2, PO2  MEDS, Scheduled  acetaminophen  1,000 mg Oral Q6H   apixaban  5 mg Oral BID   Chlorhexidine Gluconate Cloth  6 each Topical Daily   diltiazem  120 mg Oral BID   dofetilide  500 mcg Oral BID   feeding supplement  237 mL Oral BID BM   ferrous sulfate  325 mg Oral Q breakfast   furosemide  20 mg Oral Daily   gabapentin  300 mg Oral BID   isosorbide mononitrate  60 mg Oral Daily   magnesium oxide  200 mg Oral Daily   metoprolol tartrate  25 mg Oral BID   pantoprazole  40 mg Oral Daily   saccharomyces boulardii  250 mg Oral BID    Studies/Results: No results found.  Assessment: s/p  Procedure(s): XI ROBOT ASSISTED LAPAROSCOPIC PARTIAL COLECTOMY WITH BILATERAL TAP BLOCK AND ITNTRAOPERATIVE ASSESSMENT OF PERFUSION USING FIREFLY Patient Active Problem List   Diagnosis Date Noted   Colon cancer ([place]) 03/24/2021   Angiodysplasia of upper gastrointestinal tract 01/22/2021   CHF (congestive heart failure) ([place]) 01/04/2021   COVID 12/16/2020   Pacemaker 07/05/2019   ILD (interstitial lung disease) ([place]) 04/23/2019   Emphysema lung ([place]) 04/19/2018   Coronary artery disease involving native coronary artery of native heart without angina pectoris 06/08/2017   Tachycardia 06/08/2017   Renal cyst, left 03/08/2016   Blood in stool 06/10/2015   Iron deficiency anemia 04/29/2015   Chronic anticoagulation    Hearing loss 01/20/2015   Esophageal stricture 04/09/2014   PAF (paroxysmal atrial fibrillation) ([place]) 04/08/2014   Atrial flutter ([place]) 03/19/2014   Habitual alcohol use    Obesity (BMI 30.0-34.9)    OSA (obstructive sleep apnea) 02/03/2014   Hyperlipidemia with target LDL less than 100 05/07/2013   Routine health maintenance 04/19/2011   Essential hypertension 03/10/2008   ERECTILE DYSFUNCTION 03/20/2007   ATHEROSCLEROSIS, AORTIC 01/24/2007   Allergic rhinitis due to pollen 01/24/2007    Expected post op course.  Appreciate medical management of this potentially medically complicated patient by [facility]  Plan: Cont  soft diet Cont to ambulate Saline lock IVF's  Paroxysmal A fib: cont home dose Cardizem and Tikosyn  Anemia: hgb stable currently, restart Elliquis today.  Recheck Hgb in AM.  Restart Iron supplement  Htn: cont home meds  ILD, COPD, OSA: cont inhalers, use Cpap at night, O2 PRN    LOS: 2 days     .Rosario Adie, [facility], [place]    03/26/2021 7:50 AM

## 2021-03-26 NOTE — Progress Notes (Signed)
PROGRESS NOTE    Donald [name]  SNK:[id number] DOB: [birth year] DOA: 03/24/2021 PCP: Virgie Dad, MD    Brief Narrative:  87 year old male with multiple medical problems including COPD, interstitial lung disease, atrial fibrillation on anticoagulation, obstructive sleep apnea, was electively admitted to the hospital by general surgery for right colectomy due to underlying adenocarcinoma of colon.  [facility] hospitalist was consulted to assist with medical management.   Assessment & Plan:   Active Problems:   Essential hypertension   Hyperlipidemia with target LDL less than 100   OSA (obstructive sleep apnea)   Obesity (BMI 30.0-34.9)   PAF (paroxysmal atrial fibrillation) ([facility])   Chronic anticoagulation   Emphysema lung ([facility])   ILD (interstitial lung disease) ([place])   Pacemaker   Colon cancer ([facility])   Adenocarcinoma of the colon -Status post right colectomy -Further postop care per general surgery -Appears to be doing well, tolerating diet, ambulating -Plans to follow-up with oncology for further recommendations regarding management  Paroxysmal atrial fibrillation Sick sinus syndrome status post pacemaker -Appears to be in sinus rhythm -Currently on Tikosyn -He is also on diltiazem which is currently being continued, but with holding parameters -Since blood pressures have been borderline, will hold metoprolol for now -Received a dose of Eliquis this morning, but then began having bloody stools, agree with holding further anticoagulation for now  Bloody stools -Appears to have began after receiving Eliquis this morning -Further anticoagulation is on hold -Continue to follow CBC  COPD Interstitial lung disease Obstructive sleep apnea -Continue CPAP use at night -He does not require oxygen during the day -Currently respiratory status appears to be stable on room air -Continue albuterol as needed  HFpEF -Currently appears compensated -Lasix currently on hold due to  concerns of volume depletion -Imdur discontinued due to borderline blood pressures -Most recent echo shows preserved ejection fraction  Hyperlipidemia -Continue statin  Iron deficiency anemia -Secondary to underlying bowel malignancy -Followed by heme-onc -He is on supplemental iron therapy -Baseline hemoglobin appears to be between 8-9 -He has had a decline in hemoglobin today 7.7 in the setting of bloody stools -Continue to monitor  Subjective: Patient seen earlier this morning.  Reports that he is doing well.  No abdominal pain.  He is having bowel movements.  Objective: Vitals:   03/26/21 1208 03/26/21 1411 03/26/21 1533 03/26/21 1745  BP: 109/69 126/65 (!) 106/59 103/69  Pulse: 63 (!) 59 60 68  Resp: 16 20 16    Temp: 97.7 F (36.5 C) 98.1 F (36.7 C) 97.9 F (36.6 C)   TempSrc: Oral Oral Oral   SpO2: 100% 100% 100% 98%  Weight:      Height:        Intake/Output Summary (Last 24 hours) at 03/26/2021 1921 Last data filed at 03/26/2021 1817 Gross per 24 hour  Intake 1389.02 ml  Output 2375 ml  Net -985.98 ml   Filed Weights   03/25/21 0500 03/25/21 2003 03/26/21 0545  Weight: 98.3 kg 101.1 kg 99.2 kg    Examination:  General exam: Alert, awake, oriented x 3 Respiratory system: Clear to auscultation. Respiratory effort normal. Cardiovascular system:RRR. No murmurs, rubs, gallops. Gastrointestinal system: Abdomen is nondistended, soft and nontender. No organomegaly or masses felt. Normal bowel sounds heard. Central nervous system: Alert and oriented. No focal neurological deficits. Extremities: No C/C/E, +pedal pulses Skin: No rashes, lesions or ulcers Psychiatry: Judgement and insight appear normal. Mood & affect appropriate.      Data Reviewed: I have personally reviewed  following labs and imaging studies  CBC: Recent Labs  Lab 03/25/21 0438 03/26/21 0440 03/26/21 1446  WBC 12.0* 14.3* 16.2*  HGB 9.0* 8.2* 7.7*  HCT 30.9* 27.2* 25.7*  MCV 95.4  91.9 92.1  PLT 252 250 401   Basic Metabolic Panel: Recent Labs  Lab 03/25/21 0438 03/26/21 0440  NA 139 140  K 4.1 5.3*  CL 110 109  CO2 23 24  GLUCOSE 162* 125*  BUN 13 22  CREATININE 0.74 0.94  CALCIUM 8.6* 8.6*   GFR: Estimated Creatinine Clearance: 65.5 mL/min (by C-G formula based on SCr of 0.94 mg/dL). Liver Function Tests: No results for input(s): AST, ALT, ALKPHOS, BILITOT, PROT, ALBUMIN in the last 168 hours. No results for input(s): LIPASE, AMYLASE in the last 168 hours. No results for input(s): AMMONIA in the last 168 hours. Coagulation Profile: No results for input(s): INR, PROTIME in the last 168 hours. Cardiac Enzymes: No results for input(s): CKTOTAL, CKMB, CKMBINDEX, TROPONINI in the last 168 hours. BNP (last 3 results) No results for input(s): PROBNP in the last 8760 hours. HbA1C: No results for input(s): HGBA1C in the last 72 hours. CBG: No results for input(s): GLUCAP in the last 168 hours. Lipid Profile: No results for input(s): CHOL, HDL, LDLCALC, TRIG, CHOLHDL, LDLDIRECT in the last 72 hours. Thyroid Function Tests: No results for input(s): TSH, T4TOTAL, FREET4, T3FREE, THYROIDAB in the last 72 hours. Anemia Panel: No results for input(s): VITAMINB12, FOLATE, FERRITIN, TIBC, IRON, RETICCTPCT in the last 72 hours. Sepsis Labs: No results for input(s): PROCALCITON, LATICACIDVEN in the last 168 hours.  Recent Results (from the past 240 hour(s))  SARS Coronavirus 2 by RT PCR (hospital order, performed in [facility] hospital lab) Nasopharyngeal Nasopharyngeal Swab     Status: None   Collection Time: 03/24/21  6:26 AM   Specimen: Nasopharyngeal Swab  Result Value Ref Range Status   SARS Coronavirus 2 NEGATIVE NEGATIVE Final    Comment: (NOTE) SARS-CoV-2 target nucleic acids are NOT DETECTED.  The SARS-CoV-2 RNA is generally detectable in upper and lower respiratory specimens during the acute phase of infection. The lowest concentration of  SARS-CoV-2 viral copies this assay can detect is 250 copies / mL. A negative result does not preclude SARS-CoV-2 infection and should not be used as the sole basis for treatment or other patient management decisions.  A negative result may occur with improper specimen collection / handling, submission of specimen other than nasopharyngeal swab, presence of viral mutation(s) within the areas targeted by this assay, and inadequate number of viral copies (<250 copies / mL). A negative result must be combined with clinical observations, patient history, and epidemiological information.  Fact Sheet for Patients:   StrictlyIdeas.no  Fact Sheet for Healthcare Providers: BankingDealers.co.za  This test is not yet approved or  cleared by the [place] FDA and has been authorized for detection and/or diagnosis of SARS-CoV-2 by FDA under an Emergency Use Authorization (EUA).  This EUA will remain in effect (meaning this test can be used) for the duration of the COVID-19 declaration under Section 564(b)(1) of the Act, 21 U.S.C. section 360bbb-3(b)(1), unless the authorization is terminated or revoked sooner.  Performed at [facility], [place] [address], [place], [place] [address]          Radiology Studies: No results found.      Scheduled Meds:  acetaminophen  1,000 mg Oral Q6H   diltiazem  120 mg Oral BID   dofetilide  500 mcg Oral  BID   feeding supplement  237 mL Oral BID BM   ferrous sulfate  325 mg Oral Q breakfast   gabapentin  300 mg Oral BID   magnesium oxide  200 mg Oral Daily   metoprolol tartrate  25 mg Oral BID   pantoprazole  40 mg Oral Daily   Continuous Infusions:  iron dextran (INFED/DEXFERRUM) infusion 500 mg (03/26/21 1845)   lactated ringers       LOS: 2 days    Time spent: 95mins    Kathie Dike, MD [facility]   If 7PM-7AM, please contact  night-coverage [email or web address]  03/26/2021, 7:21 PM

## 2021-03-26 NOTE — TOC Initial Note (Signed)
Transition of Care [facility]) - Initial/Assessment Note    Patient Details  Name: Donald [name] MRN: [id number] Date of Birth: 03-Mar-1933  Transition of Care [facility]) CM/SW Contact:    Leeroy Cha, RN Phone Number: 03/26/2021, 8:30 AM  Clinical Narrative:                 2 Days Post-Op robotic R colectomy Subjective: Feels well.  Ambulated with PT.  Tolerating a diet.  Pain is min, only taking Tylenol.  Had a BM, no blood in stool per pt.  denies chest pain TOC PLAN OF CARE: To return to home with self care lives with spouse.  Following for progression. Expected Discharge Plan: Home/Self Care Barriers to Discharge: Continued Medical Work up   Patient Goals and CMS Choice Patient states their goals for this hospitalization and ongoing recovery are:: to go home CMS Medicare.gov Compare Post Acute Care list provided to:: Patient Choice offered to / list presented to : Patient  Expected Discharge Plan and Services Expected Discharge Plan: Home/Self Care   Discharge Planning Services: CM Consult   Living arrangements for the past 2 months: Apartment                                      Prior Living Arrangements/Services Living arrangements for the past 2 months: Apartment Lives with:: Spouse Patient language and need for interpreter reviewed:: Yes Do you feel safe going back to the place where you live?: Yes            Criminal Activity/Legal Involvement Pertinent to Current Situation/Hospitalization: No - Comment as needed  Activities of Daily Living Home Assistive Devices/Equipment: Dentures (specify type), CPAP, Grab bars around toilet, Grab bars in shower ADL Screening (condition at time of admission) Patient's cognitive ability adequate to safely complete daily activities?: Yes Is the patient deaf or have difficulty hearing?: No Does the patient have difficulty seeing, even when wearing glasses/contacts?: No Does the patient have difficulty concentrating,  remembering, or making decisions?: No Patient able to express need for assistance with ADLs?: Yes Does the patient have difficulty dressing or bathing?: No Independently performs ADLs?: Yes (appropriate for developmental age) Does the patient have difficulty walking or climbing stairs?: No Weakness of Legs: None Weakness of Arms/Hands: None  Permission Sought/Granted                  Emotional Assessment Appearance:: Appears stated age Attitude/Demeanor/Rapport: Engaged Affect (typically observed): Calm Orientation: : Oriented to Place, Oriented to Self, Oriented to  Time, Oriented to Situation Alcohol / Substance Use: Not Applicable Psych Involvement: No (comment)  Admission diagnosis:  Colon cancer [facility]) [C18.9] Patient Active Problem List   Diagnosis Date Noted   Colon cancer ([place]) 03/24/2021   Angiodysplasia of upper gastrointestinal tract 01/22/2021   CHF (congestive heart failure) ([place]) 01/04/2021   COVID 12/16/2020   Pacemaker 07/05/2019   ILD (interstitial lung disease) ([place]) 04/23/2019   Emphysema lung ([place]) 04/19/2018   Coronary artery disease involving native coronary artery of native heart without angina pectoris 06/08/2017   Tachycardia 06/08/2017   Renal cyst, left 03/08/2016   Blood in stool 06/10/2015   Iron deficiency anemia 04/29/2015   Chronic anticoagulation    Hearing loss 01/20/2015   Esophageal stricture 04/09/2014   PAF (paroxysmal atrial fibrillation) ([place]) 04/08/2014   Atrial flutter ([place]) 03/19/2014   Habitual alcohol use    Obesity (  BMI 30.0-34.9)    OSA (obstructive sleep apnea) 02/03/2014   Hyperlipidemia with target LDL less than 100 05/07/2013   Routine health maintenance 04/19/2011   Essential hypertension 03/10/2008   ERECTILE DYSFUNCTION 03/20/2007   ATHEROSCLEROSIS, AORTIC 01/24/2007   Allergic rhinitis due to pollen 01/24/2007   PCP:  Virgie Dad, MD Pharmacy:   [facility] - [place], [place] - [address] [place] [address] Phone: [phone] Fax: [phone]     Social Determinants of Health (SDOH) Interventions    Readmission Risk Interventions No flowsheet data found.

## 2021-03-26 NOTE — Progress Notes (Signed)
Pt had 2 liquid BM's, moderate bright red and darker red in color.  Pt reports some solid material but unable to differentiate because of the color of the water.  VSS.  Asymptomatic.  Notified PA for General Surgery. Orders received. Andre Lefort

## 2021-03-27 DIAGNOSIS — J439 Emphysema, unspecified: Secondary | ICD-10-CM | POA: Diagnosis not present

## 2021-03-27 DIAGNOSIS — Z7901 Long term (current) use of anticoagulants: Secondary | ICD-10-CM | POA: Diagnosis not present

## 2021-03-27 DIAGNOSIS — E785 Hyperlipidemia, unspecified: Secondary | ICD-10-CM | POA: Diagnosis not present

## 2021-03-27 DIAGNOSIS — I10 Essential (primary) hypertension: Secondary | ICD-10-CM | POA: Diagnosis not present

## 2021-03-27 DIAGNOSIS — J849 Interstitial pulmonary disease, unspecified: Secondary | ICD-10-CM | POA: Diagnosis not present

## 2021-03-27 LAB — CBC
HCT: 21.4 % — ABNORMAL LOW (ref 39.0–52.0)
Hemoglobin: 6.4 g/dL — CL (ref 13.0–17.0)
MCH: 27.7 pg (ref 26.0–34.0)
MCHC: 29.9 g/dL — ABNORMAL LOW (ref 30.0–36.0)
MCV: 92.6 fL (ref 80.0–100.0)
Platelets: 224 10*3/uL (ref 150–400)
RBC: 2.31 MIL/uL — ABNORMAL LOW (ref 4.22–5.81)
RDW: 21.8 % — ABNORMAL HIGH (ref 11.5–15.5)
WBC: 9.8 10*3/uL (ref 4.0–10.5)
nRBC: 0 % (ref 0.0–0.2)

## 2021-03-27 LAB — BASIC METABOLIC PANEL
Anion gap: 4 — ABNORMAL LOW (ref 5–15)
BUN: 26 mg/dL — ABNORMAL HIGH (ref 8–23)
CO2: 25 mmol/L (ref 22–32)
Calcium: 8.5 mg/dL — ABNORMAL LOW (ref 8.9–10.3)
Chloride: 110 mmol/L (ref 98–111)
Creatinine, Ser: 0.79 mg/dL (ref 0.61–1.24)
GFR, Estimated: >60 mL/min (ref >60)
Glucose, Bld: 100 mg/dL — ABNORMAL HIGH (ref 70–99)
Potassium: 4.6 mmol/L (ref 3.5–5.1)
Sodium: 139 mmol/L (ref 135–145)

## 2021-03-27 LAB — PREPARE RBC (CROSSMATCH)

## 2021-03-27 MED ORDER — METOPROLOL TARTRATE 25 MG PO TABS
25.0000 mg | ORAL_TABLET | Freq: Two times a day (BID) | ORAL | Status: DC
  Administered 2021-03-27 – 2021-03-28 (×2): 25 mg via ORAL
  Filled 2021-03-27 (×2): qty 1

## 2021-03-27 MED ORDER — SODIUM CHLORIDE 0.9% IV SOLUTION: Freq: Once | INTRAVENOUS | Status: AC

## 2021-03-27 MED ORDER — DOCUSATE SODIUM 100 MG PO CAPS
100.0000 mg | ORAL_CAPSULE | Freq: Two times a day (BID) | ORAL | Status: DC
  Administered 2021-03-27 – 2021-03-28 (×3): 100 mg via ORAL
  Filled 2021-03-27 (×3): qty 1

## 2021-03-27 NOTE — Plan of Care (Signed)
  Problem: Clinical Measurements: Goal: Ability to maintain clinical measurements within normal limits will improve Outcome: Progressing   Problem: Skin Integrity: Goal: Demonstration of wound healing without infection will improve Outcome: Progressing   Problem: Clinical Measurements: Goal: Will remain free from infection Outcome: Progressing   Problem: Clinical Measurements: Goal: Diagnostic test results will improve Outcome: Progressing   Problem: Activity: Goal: Risk for activity intolerance will decrease Outcome: Progressing   Problem: Elimination: Goal: Will not experience complications related to bowel motility Outcome: Progressing

## 2021-03-27 NOTE — Progress Notes (Signed)
3 Days Post-Op   Subjective/Chief Complaint: Still feels a little weak and shaky. Passing flatus but no bm yet   Objective: Vital signs in last 24 hours: Temp:  [97.7 F (36.5 C)-98.1 F (36.7 C)] 97.7 F (36.5 C) (11/12 0532) Pulse Rate:  [59-68] 59 (11/12 0532) Resp:  [16-20] 16 (11/12 0532) BP: (102-126)/(59-71) 113/71 (11/12 0532) SpO2:  [96 %-100 %] 99 % (11/12 0532) Weight:  [101.8 kg] 101.8 kg (11/12 0611) Last BM Date: 03/26/21  Intake/Output from previous day: 11/11 0701 - 11/12 0700 In: 1389 [P.O.:360; IV Piggyback:1029] Out: 1750 [Urine:1750] Intake/Output this shift: No intake/output data recorded.  General appearance: alert and cooperative Resp: clear to auscultation bilaterally Cardio: regular rate and rhythm GI: soft, minimal tenderness. Few bs. Incisions ok  Lab Results:  Recent Labs    03/26/21 1446 03/27/21 0526  WBC 16.2* 9.8  HGB 7.7* 6.4*  HCT 25.7* 21.4*  PLT 316 224   BMET Recent Labs    03/26/21 0440 03/27/21 0526  NA 140 139  K 5.3* 4.6  CL 109 110  CO2 24 25  GLUCOSE 125* 100*  BUN 22 26*  CREATININE 0.94 0.79  CALCIUM 8.6* 8.5*   PT/INR No results for input(s): LABPROT, INR in the last 72 hours. ABG No results for input(s): PHART, HCO3 in the last 72 hours.  Invalid input(s): PCO2, PO2  Studies/Results: No results found.  Anti-infectives: Anti-infectives (From admission, onward)    Start     Dose/Rate Route Frequency Ordered Stop   03/24/21 2000  cefoTEtan (CEFOTAN) 2 g in sodium chloride 0.9 % 100 mL IVPB        2 g 200 mL/hr over 30 Minutes Intravenous Every 12 hours 03/24/21 1405 03/24/21 2145   03/24/21 0630  cefoTEtan (CEFOTAN) 2 g in sodium chloride 0.9 % 100 mL IVPB        2 g 200 mL/hr over 30 Minutes Intravenous On call to O.R. 03/24/21 1962 03/24/21 0837       Assessment/Plan: s/p Procedure(s): XI ROBOT ASSISTED LAPAROSCOPIC PARTIAL COLECTOMY WITH BILATERAL TAP BLOCK AND ITNTRAOPERATIVE ASSESSMENT OF  PERFUSION USING FIREFLY (N/A) Advance diet Transfuse 2U prbc's for hg of 6.4 given underlying cardiac disease Add colace Paroxysmal A fib: cont home dose Cardizem and Tikosyn   Anemia: hgb stable currently, restart Elliquis today.  Recheck Hgb in AM.  Restart Iron supplement   Htn: cont home meds   ILD, COPD, OSA: cont inhalers, use Cpap at night, O2 PRN  LOS: 3 days    Autumn Messing III 03/27/2021

## 2021-03-27 NOTE — Progress Notes (Signed)
PROGRESS NOTE    Donald [name]  WVP:[id number] DOB: 11-Apr-1933 DOA: 03/24/2021 PCP: Virgie Dad, MD    Brief Narrative:  87 year old male with multiple medical problems including COPD, interstitial lung disease, atrial fibrillation on anticoagulation, obstructive sleep apnea, was electively admitted to the hospital by general surgery for right colectomy due to underlying adenocarcinoma of colon.  [facility] hospitalist was consulted to assist with medical management.   Assessment & Plan:   Active Problems:   Essential hypertension   Hyperlipidemia with target LDL less than 100   OSA (obstructive sleep apnea)   Obesity (BMI 30.0-34.9)   PAF (paroxysmal atrial fibrillation) (H[facility])   Chronic anticoagulation   Emphysema lung (H[facility])   ILD (interstitial lung disease) ([place])   Pacemaker   Colon cancer (H[facility])   Adenocarcinoma of the colon -Status post right colectomy -Further postop care per general surgery -Appears to be doing well, tolerating diet, ambulating -Plans to follow-up with oncology for further recommendations regarding management  Paroxysmal atrial fibrillation Sick sinus syndrome status post pacemaker -Appears to be in sinus rhythm -Currently on Tikosyn -He is also on diltiazem which is currently being continued, but with holding parameters -Since blood pressures have been borderline, will hold metoprolol for now -Received a dose of Eliquis 11/11, but then began having bloody stools, agree with holding further anticoagulation for now  Bloody stools -Appears to have began after receiving Eliquis on 11/11 -Further anticoagulation is on hold -Reports that overall blood in stools still present today, although improving from yesterday -Continue to follow CBC  COPD Interstitial lung disease Obstructive sleep apnea -Continue CPAP use at night -He does not require oxygen during the day -Currently respiratory status appears to be stable on room air -Continue albuterol as  needed  HFpEF -Currently appears compensated -Lasix currently on hold due to concerns of volume depletion -Imdur discontinued due to borderline blood pressures -Most recent echo shows preserved ejection fraction  Hyperlipidemia -Continue statin  Iron deficiency anemia -Secondary to underlying bowel malignancy -Followed by heme-onc -He is on supplemental iron therapy -Baseline hemoglobin appears to be between 8-9 -Hemoglobin down to 6.4 today, secondary to blood loss in stools -2 units PRBC have been ordered to be transfused -Follow-up CBC  Subjective: Patient says he continues to feel tired.  He is not dizzy when he stands.  Able to ambulate in the hall.  Does not have any chest pain.  Reports passing small amounts of blood today with his stool, better than yesterday.  No vomiting.  Objective: Vitals:   03/27/21 0611 03/27/21 1319 03/27/21 1345 03/27/21 1705  BP:  125/62 122/65 (!) 145/68  Pulse:  60 61 70  Resp:  16 20 (!) 22  Temp:  98 F (36.7 C) 97.9 F (36.6 C) 97.6 F (36.4 C)  TempSrc:  Oral Oral Oral  SpO2:  100% 99% 98%  Weight: 101.8 kg     Height:        Intake/Output Summary (Last 24 hours) at 03/27/2021 1912 Last data filed at 03/27/2021 1834 Gross per 24 hour  Intake 1035 ml  Output 1325 ml  Net -290 ml   Filed Weights   03/25/21 2003 03/26/21 0545 03/27/21 0611  Weight: 101.1 kg 99.2 kg 101.8 kg    Examination:  General exam: Alert, awake, oriented x 3 Respiratory system: Clear to auscultation. Respiratory effort normal. Cardiovascular system:RRR. No murmurs, rubs, gallops. Gastrointestinal system: Abdomen is nondistended, soft and nontender. No organomegaly or masses felt. Normal bowel sounds heard. Central  nervous system: Alert and oriented. No focal neurological deficits. Extremities: No C/C/E, +pedal pulses Skin: No rashes, lesions or ulcers Psychiatry: Judgement and insight appear normal. Mood & affect appropriate.      Data  Reviewed: I have personally reviewed following labs and imaging studies  CBC: Recent Labs  Lab 03/25/21 0438 03/26/21 0440 03/26/21 1446 03/27/21 0526  WBC 12.0* 14.3* 16.2* 9.8  HGB 9.0* 8.2* 7.7* 6.4*  HCT 30.9* 27.2* 25.7* 21.4*  MCV 95.4 91.9 92.1 92.6  PLT 252 250 316 193   Basic Metabolic Panel: Recent Labs  Lab 03/25/21 0438 03/26/21 0440 03/27/21 0526  NA 139 140 139  K 4.1 5.3* 4.6  CL 110 109 110  CO2 23 24 25   GLUCOSE 162* 125* 100*  BUN 13 22 26*  CREATININE 0.74 0.94 0.79  CALCIUM 8.6* 8.6* 8.5*   GFR: Estimated Creatinine Clearance: 77.9 mL/min (by C-G formula based on SCr of 0.79 mg/dL). Liver Function Tests: No results for input(s): AST, ALT, ALKPHOS, BILITOT, PROT, ALBUMIN in the last 168 hours. No results for input(s): LIPASE, AMYLASE in the last 168 hours. No results for input(s): AMMONIA in the last 168 hours. Coagulation Profile: No results for input(s): INR, PROTIME in the last 168 hours. Cardiac Enzymes: No results for input(s): CKTOTAL, CKMB, CKMBINDEX, TROPONINI in the last 168 hours. BNP (last 3 results) No results for input(s): PROBNP in the last 8760 hours. HbA1C: No results for input(s): HGBA1C in the last 72 hours. CBG: No results for input(s): GLUCAP in the last 168 hours. Lipid Profile: No results for input(s): CHOL, HDL, LDLCALC, TRIG, CHOLHDL, LDLDIRECT in the last 72 hours. Thyroid Function Tests: No results for input(s): TSH, T4TOTAL, FREET4, T3FREE, THYROIDAB in the last 72 hours. Anemia Panel: No results for input(s): VITAMINB12, FOLATE, FERRITIN, TIBC, IRON, RETICCTPCT in the last 72 hours. Sepsis Labs: No results for input(s): PROCALCITON, LATICACIDVEN in the last 168 hours.  Recent Results (from the past 240 hour(s))  SARS Coronavirus 2 by RT PCR (hospital order, performed in [facility] hospital lab) Nasopharyngeal Nasopharyngeal Swab     Status: None   Collection Time: 03/24/21  6:26 AM   Specimen: Nasopharyngeal  Swab  Result Value Ref Range Status   SARS Coronavirus 2 NEGATIVE NEGATIVE Final    Comment: (NOTE) SARS-CoV-2 target nucleic acids are NOT DETECTED.  The SARS-CoV-2 RNA is generally detectable in upper and lower respiratory specimens during the acute phase of infection. The lowest concentration of SARS-CoV-2 viral copies this assay can detect is 250 copies / mL. A negative result does not preclude SARS-CoV-2 infection and should not be used as the sole basis for treatment or other patient management decisions.  A negative result may occur with improper specimen collection / handling, submission of specimen other than nasopharyngeal swab, presence of viral mutation(s) within the areas targeted by this assay, and inadequate number of viral copies (<250 copies / mL). A negative result must be combined with clinical observations, patient history, and epidemiological information.  Fact Sheet for Patients:   StrictlyIdeas.no  Fact Sheet for Healthcare Providers: BankingDealers.co.za  This test is not yet approved or  cleared by the [place] FDA and has been authorized for detection and/or diagnosis of SARS-CoV-2 by FDA under an Emergency Use Authorization (EUA).  This EUA will remain in effect (meaning this test can be used) for the duration of the COVID-19 declaration under Section 564(b)(1) of the Act, 21 U.S.C. section 360bbb-3(b)(1), unless the authorization is terminated or revoked  sooner.  Performed at [facility], [place] [address] [place], [place] [address]          Radiology Studies: No results found.      Scheduled Meds:  acetaminophen  1,000 mg Oral Q6H   diltiazem  120 mg Oral BID   docusate sodium  100 mg Oral BID   dofetilide  500 mcg Oral BID   feeding supplement  237 mL Oral BID BM   ferrous sulfate  325 mg Oral Q breakfast   gabapentin  300 mg Oral BID   magnesium oxide  200 mg  Oral Daily   pantoprazole  40 mg Oral Daily   Continuous Infusions:  lactated ringers       LOS: 3 days    Time spent: 79mins    Kathie Dike, MD [facility]   If 7PM-7AM, please contact night-coverage [email or web address]  03/27/2021, 7:12 PM

## 2021-03-27 NOTE — Plan of Care (Signed)
  Problem: Clinical Measurements: Goal: Ability to maintain clinical measurements within normal limits will improve Outcome: Progressing Goal: Postoperative complications will be avoided or minimized Outcome: Progressing   Problem: Skin Integrity: Goal: Demonstration of wound healing without infection will improve Outcome: Progressing   Problem: Health Behavior/Discharge Planning: Goal: Ability to manage health-related needs will improve Outcome: Progressing   Problem: Clinical Measurements: Goal: Ability to maintain clinical measurements within normal limits will improve Outcome: Progressing Goal: Will remain free from infection Outcome: Progressing Goal: Diagnostic test results will improve Outcome: Progressing Goal: Respiratory complications will improve Outcome: Progressing Goal: Cardiovascular complication will be avoided Outcome: Progressing   Problem: Activity: Goal: Risk for activity intolerance will decrease Outcome: Progressing   Problem: Nutrition: Goal: Adequate nutrition will be maintained Outcome: Progressing   Problem: Elimination: Goal: Will not experience complications related to bowel motility Outcome: Progressing Goal: Will not experience complications related to urinary retention Outcome: Progressing   Problem: Pain Managment: Goal: General experience of comfort will improve Outcome: Progressing   Problem: Safety: Goal: Ability to remain free from injury will improve Outcome: Progressing   Problem: Skin Integrity: Goal: Risk for impaired skin integrity will decrease Outcome: Progressing

## 2021-03-28 LAB — CBC
HCT: 29.8 % — ABNORMAL LOW (ref 39.0–52.0)
Hemoglobin: 9.4 g/dL — ABNORMAL LOW (ref 13.0–17.0)
MCH: 28.1 pg (ref 26.0–34.0)
MCHC: 31.5 g/dL (ref 30.0–36.0)
MCV: 89.2 fL (ref 80.0–100.0)
Platelets: 269 10*3/uL (ref 150–400)
RBC: 3.34 MIL/uL — ABNORMAL LOW (ref 4.22–5.81)
RDW: 20.4 % — ABNORMAL HIGH (ref 11.5–15.5)
WBC: 10.4 10*3/uL (ref 4.0–10.5)
nRBC: 1.7 % — ABNORMAL HIGH (ref 0.0–0.2)

## 2021-03-28 LAB — BASIC METABOLIC PANEL
Anion gap: 4 — ABNORMAL LOW (ref 5–15)
BUN: 26 mg/dL — ABNORMAL HIGH (ref 8–23)
CO2: 26 mmol/L (ref 22–32)
Calcium: 9.2 mg/dL (ref 8.9–10.3)
Chloride: 110 mmol/L (ref 98–111)
Creatinine, Ser: 0.81 mg/dL (ref 0.61–1.24)
GFR, Estimated: >60 mL/min (ref >60)
Glucose, Bld: 95 mg/dL (ref 70–99)
Potassium: 4.5 mmol/L (ref 3.5–5.1)
Sodium: 140 mmol/L (ref 135–145)

## 2021-03-28 LAB — HEMOGLOBIN AND HEMATOCRIT, BLOOD
HCT: 26.3 % — ABNORMAL LOW (ref 39.0–52.0)
Hemoglobin: 8.4 g/dL — ABNORMAL LOW (ref 13.0–17.0)

## 2021-03-28 MED ORDER — HYDROCODONE-ACETAMINOPHEN 5-325 MG PO TABS: 1.0000 | ORAL_TABLET | Freq: Four times a day (QID) | ORAL | 0 refills | Status: DC | PRN

## 2021-03-28 NOTE — Progress Notes (Signed)
4 Days Post-Op   Subjective/Chief Complaint: Feels good. No more blood per rectum   Objective: Vital signs in last 24 hours: Temp:  [97.6 F (36.4 C)-98.2 F (36.8 C)] 97.7 F (36.5 C) (11/13 0536) Pulse Rate:  [60-70] 64 (11/13 0536) Resp:  [10-22] 16 (11/13 0536) BP: (122-145)/(58-77) 131/77 (11/13 0536) SpO2:  [98 %-100 %] 100 % (11/13 0536) Weight:  [101.8 kg] 101.8 kg (11/13 0500) Last BM Date: 03/26/21  Intake/Output from previous day: 11/12 0701 - 11/13 0700 In: 1350 [P.O.:720; Blood:630] Out: 750 [Urine:750] Intake/Output this shift: No intake/output data recorded.  General appearance: alert and cooperative Resp: clear to auscultation bilaterally Cardio: regular rate and rhythm GI: soft, minimal tenderness  Lab Results:  Recent Labs    03/27/21 0526 03/28/21 0020 03/28/21 0512  WBC 9.8  --  10.4  HGB 6.4* 8.4* 9.4*  HCT 21.4* 26.3* 29.8*  PLT 224  --  269   BMET Recent Labs    03/27/21 0526 03/28/21 0512  NA 139 140  K 4.6 4.5  CL 110 110  CO2 25 26  GLUCOSE 100* 95  BUN 26* 26*  CREATININE 0.79 0.81  CALCIUM 8.5* 9.2   PT/INR No results for input(s): LABPROT, INR in the last 72 hours. ABG No results for input(s): PHART, HCO3 in the last 72 hours.  Invalid input(s): PCO2, PO2  Studies/Results: No results found.  Anti-infectives: Anti-infectives (From admission, onward)    Start     Dose/Rate Route Frequency Ordered Stop   03/24/21 2000  cefoTEtan (CEFOTAN) 2 g in sodium chloride 0.9 % 100 mL IVPB        2 g 200 mL/hr over 30 Minutes Intravenous Every 12 hours 03/24/21 1405 03/24/21 2145   03/24/21 0630  cefoTEtan (CEFOTAN) 2 g in sodium chloride 0.9 % 100 mL IVPB        2 g 200 mL/hr over 30 Minutes Intravenous On call to O.R. 03/24/21 [id number] 03/24/21 0837       Assessment/Plan: s/p Procedure(s): XI ROBOT ASSISTED LAPAROSCOPIC PARTIAL COLECTOMY WITH BILATERAL TAP BLOCK AND ITNTRAOPERATIVE ASSESSMENT OF PERFUSION USING FIREFLY  (N/A) Advance diet Discharge Paroxysmal A fib: cont home dose Cardizem and Tikosyn   Anemia: hgb stable currently, restart Elliquis today.  Recheck Hgb in AM.  Restart Iron supplement   Htn: cont home meds   ILD, COPD, OSA: cont inhalers, use Cpap at night, O2 PRN  LOS: 4 days    Autumn Messing III 03/28/2021

## 2021-03-29 ENCOUNTER — Telehealth: Payer: Self-pay | Admitting: *Deleted

## 2021-03-29 LAB — BPAM RBC
Blood Product Expiration Date: 202212092359
Blood Product Expiration Date: 202212102359
ISSUE DATE / TIME: 202211121323
ISSUE DATE / TIME: 202211121951
Unit Type and Rh: 9500
Unit Type and Rh: 9500

## 2021-03-29 LAB — TYPE AND SCREEN
ABO/RH(D): O NEG
Antibody Screen: NEGATIVE
Unit division: 0
Unit division: 0

## 2021-03-29 NOTE — Telephone Encounter (Signed)
Transition Care Management Unsuccessful Follow-up Telephone Call  Date of discharge and from where:  03/28/2021  Attempts:  1st Attempt  Reason for unsuccessful TCM follow-up call:  No answer/busy

## 2021-03-30 LAB — SURGICAL PATHOLOGY

## 2021-03-30 NOTE — Telephone Encounter (Signed)
Transition Care Management Follow-up Telephone Call Date of discharge and from where: 03/28/2021 [place] How have you been since you were released from the hospital? Better, feeling good Any questions or concerns? No  Items Reviewed: Did the pt receive and understand the discharge instructions provided? Yes  Medications obtained and verified? Yes  Other? No  Any new allergies since your discharge? No  Dietary orders reviewed? Yes Do you have support at home? Yes   Home Care and Equipment/Supplies: Were home health services ordered? no If so, what is the name of the agency? na  Has the agency set up a time to come to the patient's home? no Were any new equipment or medical supplies ordered?  No What is the name of the medical supply agency? na Were you able to get the supplies/equipment? not applicable Do you have any questions related to the use of the equipment or supplies? No  Functional Questionnaire: (I = Independent and D = Dependent) ADLs: I  Bathing/Dressing- I  Meal Prep- I  Eating- I  Maintaining continence- I  Transferring/Ambulation- I  Managing Meds- I  Follow up appointments reviewed:  PCP Hospital f/u appt confirmed? Yes  Patient does not want to schedule an appointment. There are none available at [facility] and he doesn't want to come to office. Stated that he is doing good and has an appointment scheduled in January. Stated that he is following up with the Surgeon and the Oncologist. Stated he will call us if he needs [place] sooner than January.  [facility] f/u appt confirmed? Yes  Are transportation arrangements needed? No  If their condition worsens, is the pt aware to call PCP or go to the Emergency Dept.? Yes Was the patient provided with contact information for the PCP's office or ED? Yes Was to pt encouraged to call back with questions or concerns? Yes

## 2021-04-01 ENCOUNTER — Encounter: Payer: Self-pay | Admitting: *Deleted

## 2021-04-01 NOTE — Progress Notes (Signed)
PATIENT NAVIGATOR PROGRESS NOTE  Name: Donald [name] Date: 04/01/2021 MRN: [id number]  DOB: [birth year]   Reason for visit:  Phone call after D/C hospital  Comments:  [name] states that he is recovering well after surgery, "just a little sore", reviewed upcoming appt here at [facility].     Time spent counseling/coordinating care: < 15 minutes

## 2021-04-02 NOTE — Discharge Summary (Signed)
Patient ID: MERCURY ROCK [id number] 15-Jan-1933 87 y.o.  Admit date: 03/24/2021 Discharge date: 03/28/2021  Admitting Diagnosis: Malignant neoplasm of ascending colon (CMS-HCC) Mass of colon  Discharge Diagnosis Colon Cancer s/p XI ROBOT ASSISTED LAPAROSCOPIC RIGHT COLECTOMY WITH BILATERAL TAP BLOCK AND ITNTRAOPERATIVE ASSESSMENT OF PERFUSION USING FIREFLY  Consultants TR[facility]  Reason for Admission: Donald [name] is a 87 y.o. male who is seen today as an office consultation at the request of Dr. Bryan Lemma for evaluation of colon mass   87 year old male with a history of cardiomyopathy, coronary artery disease and recent anemia that was diagnosed due to shortness of breath and fatigue.  This was treated and then evaluated with colonoscopy and EGD.  This showed a cecal mass with biopsies showing adenocarcinoma.  Patient has a history of pacemaker placement and is on Eliquis due to atrial fibrillation.  He denies any past abdominal surgical history.  Patient reports recent history of diarrhea.  He denies any weight loss.  Procedures Dr. Marcello Moores - XI ROBOT ASSISTED LAPAROSCOPIC RIGHT COLECTOMY WITH BILATERAL TAP BLOCK AND ITNTRAOPERATIVE ASSESSMENT OF PERFUSION USING FIREFLY - 03/24/2021  Hospital Course:  Patient presented to the hospital and underwent above procedure by Dr. Marcello Moores on 11/18. Patient tolerated the procedure well and was transferred to the floor. TRH was consulted post to assist with patients chronic medical conditions. Diet was advanced and tolerated. Patient worked with therapies who recommended no follow up. Eliquis was restarted on POD 2. Patient had some bright red bm's and hgb dropped to 6.4. He was given PRBC. Serial hgbs were monitored until stabilized. No further bloody bm's reported. On POD 4 patient was felt stable for discharge. Patient to follow up with Dr. Marcello Moores in the office as noted below.   I was not directly involved in this patient's care and did not see the  patient during their hospital stay, therefore the information in this discharge summary was taken entirely from the chart.  Physical Exam: Please see MD's note from earlier today  Allergies as of 03/28/2021   No Known Allergies      Medication List     TAKE these medications    acetaminophen 500 MG tablet Commonly known as: TYLENOL Take 500 mg by mouth every 6 (six) hours as needed for moderate pain or headache.   Adult Gummy Chew Chew 1 capsule by mouth 2 (two) times daily.   OCUVITE PO Take 1 tablet by mouth 2 (two) times daily.   albuterol 108 (90 Base) MCG/ACT inhaler Commonly known as: VENTOLIN HFA Inhale TWO puffs into THE lungs every SIX hours as needed FOR wheezing OR shortness of breath   ALIGN PO Take 1 capsule by mouth daily.   Benefiber Powd Take 1 Scoop by mouth daily as needed (constipation).   Benzedrex Inha Generic drug: Propylhexedrine Place 1 each into the nose daily as needed (congestion).   carboxymethylcellulose 0.5 % Soln Commonly known as: REFRESH PLUS Place 1 drop into both eyes daily as needed (dry eyes).   diltiazem 120 MG 24 hr capsule Commonly known as: CARDIZEM CD Take 1 capsule (120 mg total) by mouth 2 (two) times daily.   dofetilide 500 MCG capsule Commonly known as: TIKOSYN Take 1 capsule (500 mcg total) by mouth 2 (two) times daily. Please keep upcoming appt in May 2022 with Dr. Caryl Comes before anymore refills. Thank you   Eliquis 5 MG Tabs tablet Generic drug: apixaban TAKE ONE TABLET BY MOUTH EVERY MORNING and TAKE ONE TABLET BY  MOUTH EVERYDAY AT BEDTIME   ferrous sulfate 325 (65 FE) MG tablet Take 1 tablet (325 mg total) by mouth daily with breakfast.   furosemide 20 MG tablet Commonly known as: LASIX TAKE ONE TABLET BY MOUTH ONCE DAILY   HYDROcodone-acetaminophen 5-325 MG tablet Commonly known as: NORCO/VICODIN Take 1 tablet by mouth every 6 (six) hours as needed for moderate pain or severe pain.   isosorbide  mononitrate 60 MG 24 hr tablet Commonly known as: IMDUR Take 1 tablet (60 mg total) by mouth daily.   magnesium chloride 64 MG Tbec SR tablet Commonly known as: SLOW-MAG Take 64 mg by mouth at bedtime.   metoprolol tartrate 25 MG tablet Commonly known as: LOPRESSOR TAKE ONE TABLET BY MOUTH EVERY MORNING and TAKE ONE TABLET EVERYDAY AT BEDTIME   nitroGLYCERIN 0.4 MG SL tablet Commonly known as: NITROSTAT PLACE 1 TABLET UNDER THE TONGUE EVERY 5 MINUTES AS NEEDED FOR CHEST PAIN, MAY REPEAT FOR 3 DOSES.   pantoprazole 40 MG tablet Commonly known as: PROTONIX Take 1 tablet (40 mg total) by mouth 2 (two) times daily. Protonix ( pantoprazole ) 40 mg twice daily for 6 weeks, then reduce back to 40 mg daily.   Pedialyte Soln Take by mouth daily. 6 oz   rosuvastatin 10 MG tablet Commonly known as: CRESTOR Take 1 tablet (10 mg total) by mouth daily.   sodium chloride 0.65 % Soln nasal spray Commonly known as: OCEAN Place 1 spray into both nostrils as needed for congestion.          Follow-up Information     Leighton Ruff, MD. Schedule an appointment as soon as possible for a visit in 2 week(s).   Specialties: General Surgery, Colon and Rectal Surgery Contact information: [address] [place] [id number] [phone]                 Signed: Alferd Apa, [facility] 04/02/2021, 2:04 PM Please see Amion for pager number during day hours 7:00am-4:30pm

## 2021-04-06 DIAGNOSIS — G4733 Obstructive sleep apnea (adult) (pediatric): Secondary | ICD-10-CM | POA: Diagnosis not present

## 2021-04-07 ENCOUNTER — Other Ambulatory Visit (HOSPITAL_COMMUNITY): Payer: Self-pay

## 2021-04-07 ENCOUNTER — Other Ambulatory Visit: Payer: Self-pay

## 2021-04-07 ENCOUNTER — Telehealth: Payer: Self-pay | Admitting: Oncology

## 2021-04-07 ENCOUNTER — Inpatient Hospital Stay: Payer: PPO | Attending: Oncology | Admitting: Oncology

## 2021-04-07 ENCOUNTER — Encounter: Payer: Self-pay | Admitting: *Deleted

## 2021-04-07 VITALS — BP 121/60 | HR 73 | Temp 98.7°F | Resp 18 | Ht 69.0 in | Wt 218.2 lb

## 2021-04-07 DIAGNOSIS — D649 Anemia, unspecified: Secondary | ICD-10-CM | POA: Diagnosis not present

## 2021-04-07 DIAGNOSIS — I4891 Unspecified atrial fibrillation: Secondary | ICD-10-CM | POA: Diagnosis not present

## 2021-04-07 DIAGNOSIS — Z7901 Long term (current) use of anticoagulants: Secondary | ICD-10-CM | POA: Diagnosis not present

## 2021-04-07 DIAGNOSIS — J849 Interstitial pulmonary disease, unspecified: Secondary | ICD-10-CM | POA: Insufficient documentation

## 2021-04-07 DIAGNOSIS — I2510 Atherosclerotic heart disease of native coronary artery without angina pectoris: Secondary | ICD-10-CM | POA: Insufficient documentation

## 2021-04-07 DIAGNOSIS — C182 Malignant neoplasm of ascending colon: Secondary | ICD-10-CM

## 2021-04-07 DIAGNOSIS — Z950 Presence of cardiac pacemaker: Secondary | ICD-10-CM | POA: Diagnosis not present

## 2021-04-07 DIAGNOSIS — G4733 Obstructive sleep apnea (adult) (pediatric): Secondary | ICD-10-CM | POA: Diagnosis not present

## 2021-04-07 DIAGNOSIS — C180 Malignant neoplasm of cecum: Secondary | ICD-10-CM | POA: Insufficient documentation

## 2021-04-07 DIAGNOSIS — D509 Iron deficiency anemia, unspecified: Secondary | ICD-10-CM | POA: Diagnosis not present

## 2021-04-07 DIAGNOSIS — I5032 Chronic diastolic (congestive) heart failure: Secondary | ICD-10-CM | POA: Diagnosis not present

## 2021-04-07 MED ORDER — CAPECITABINE 500 MG PO TABS
ORAL_TABLET | ORAL | 0 refills | Status: DC
Start: 2021-04-21 — End: 2021-04-12
  Filled 2021-04-07: qty 98, fill #0

## 2021-04-07 NOTE — Progress Notes (Signed)
PATIENT NAVIGATOR PROGRESS NOTE  Name: Donald [name] Date: 04/07/2021 MRN: [id number]  DOB: February 23, 1933   Reason for visit:  F/U visit and plan of care visit  Comments:  Met with Mr and Mrs Sacra with Dr Benay Spice post surgery to discuss plan of care. He will come in for MD visit 12/7 and to begin Capecitabine after labs that day.We reviewed potential side effects of nausea, diarrhea, hand/foot syndrome, skin changes/rash, mouth sores, immunosuppression, fatigue. Given written information and informed that he will be contacted by Pharmacy team.  Will call for F/U questions.    Time spent counseling/coordinating care: > 60 minutes

## 2021-04-07 NOTE — Progress Notes (Signed)
[facility] OFFICE PROGRESS NOTE   Diagnosis: Colon cancer  INTERVAL HISTORY:   Donald [name] underwent a robotic assisted right colectomy by Dr. Marcello Moores on 03/24/2021.  A mass was noted in the cecum.  No evidence of metastatic disease.  The pathology revealed a moderately differentiated adenocarcinoma the cecum.  Tumor invades into the pericolonic tissue.  1/8 lymph nodes contained metastatic carcinoma.  1 tumor deposit.  Lymphovascular invasion is present.  No perineural invasion.  The resection margins are negative.  No loss of mismatch repair protein expression.  Donald [name] developed rectal bleeding after resuming Eliquis on postoperative day 2.  He was transfused with packed red blood cells.  He was discharged on 03/28/2021.  The hemoglobin was up to 9.4 at discharge.  He received 2 units of packed red blood cells on 03/27/2021.  He had previously been transfused with 1 unit on 03/02/2021.  Objective:  Vital signs in last 24 hours:  Blood pressure 121/60, pulse 73, temperature 98.7 F (37.1 C), resp. rate 18, height $RemoveBe'5\' 9"'pTiSqRSMa$  (1.753 m), weight 218 lb 3.2 oz (99 kg), SpO2 100 %.    Resp: Lungs clear bilaterally Cardio: Regular rate and rhythm GI: 3-4 mm superficial opening at the left side of the low transverse incision.  Other incisions are healed.  No hepatomegaly Vascular: No leg edema   Lab Results:  Lab Results  Component Value Date   WBC 10.4 03/28/2021   HGB 9.4 (L) 03/28/2021   HCT 29.8 (L) 03/28/2021   MCV 89.2 03/28/2021   PLT 269 03/28/2021   NEUTROABS 5.4 03/01/2021    CMP  Lab Results  Component Value Date   NA 140 03/28/2021   K 4.5 03/28/2021   CL 110 03/28/2021   CO2 26 03/28/2021   GLUCOSE 95 03/28/2021   BUN 26 (H) 03/28/2021   CREATININE 0.81 03/28/2021   CALCIUM 9.2 03/28/2021   PROT 6.8 03/05/2020   ALBUMIN 4.2 01/07/2021   ALBUMIN 4.2 01/07/2021   AST 18 01/07/2021   AST 18 01/07/2021   ALT 10 01/07/2021   ALT 10 01/07/2021    ALKPHOS 94 01/07/2021   ALKPHOS 94 01/07/2021   BILITOT 0.5 03/05/2020   GFRNONAA >60 03/28/2021   GFRAA >60 02/25/2019    Lab Results  Component Value Date   CEA 1.5 02/03/2021     Medications: I have reviewed the patient's current medications.   Assessment/Plan: Colon cancer-stage III (T3N1), cecum mass biopsy 02/03/2021-adenocarcinoma CTs 02/16/2021-stable interstitial lung disease, mild COPD, cardiomegaly, CAD, multiple liver cysts with a few subcentimeter too small to characterize liver lesions, circumferential wall thickening at the ileocecal valve, right lower quadrant ileocolic lymph node measures 1.1 cm 03/24/2021, robotic assisted laparoscopic right colectomy-T3, 1/8 lymph nodes, 1 tumor deposit, lymphovascular invasion, mismatch repair protein expression intact, MSS CAD Interstitial lung disease Iron deficiency anemia secondary to #1 Multiple colon polyps on the colonoscopy 02/03/2021-microscopic fragments of adenocarcinoma involving biopsies of transverse and ascending colon polyps felt to be contaminant from the cecum biopsy 2 tubular adenomas on the right colectomy specimen 03/24/2021 OSA Sick sinus syndrome-pacemaker Atrial fibrillation-maintained on apixaban Chronic diastolic CHF History of multiple skin cancers     Disposition: Donald [name] has been diagnosed with stage III colon cancer.  I discussed the prognosis and reviewed details of the surgical pathology report with him.  We discussed data supporting the use of adjuvant 5-fluorouracil based chemotherapy in patients with resected stage III colon cancer.  I recommend adjuvant capecitabine. We reviewed  potential toxicities associated with capecitabine including the chance of nausea, mucositis, diarrhea, and hematologic toxicity.  We discussed the sun sensitivity, rash, hyperpigmentation, and hand/foot syndrome associated with capecitabine.  We also discussed cardiac toxicity associated with capecitabine.  He agrees to  proceed.  He will return for an office visit and CBC prior to beginning capecitabine on 04/21/2021.  He does not appear to have hereditary 9 polyposis colon cancer syndrome, but his family members are at increased risk developing colorectal cancer and should receive appropriate screening.  A capecitabine prescription was entered today.  Betsy Coder, MD  04/07/2021  2:19 PM

## 2021-04-07 NOTE — Progress Notes (Signed)
The proposed treatment discussed in conference is for discussion purpose only and is not a binding recommendation.  The patients have not been physically examined, or presented with their treatment options.  Therefore, final treatment plans cannot be decided.  

## 2021-04-07 NOTE — Telephone Encounter (Signed)
Scheduled appointment per 11/23 los. Patient aware.

## 2021-04-09 DIAGNOSIS — G4733 Obstructive sleep apnea (adult) (pediatric): Secondary | ICD-10-CM | POA: Diagnosis not present

## 2021-04-12 ENCOUNTER — Telehealth: Payer: Self-pay | Admitting: Pharmacist

## 2021-04-12 ENCOUNTER — Encounter: Payer: PPO | Admitting: Internal Medicine

## 2021-04-12 ENCOUNTER — Other Ambulatory Visit (HOSPITAL_COMMUNITY): Payer: Self-pay

## 2021-04-12 ENCOUNTER — Telehealth: Payer: Self-pay | Admitting: Pharmacy Technician

## 2021-04-12 ENCOUNTER — Other Ambulatory Visit (HOSPITAL_BASED_OUTPATIENT_CLINIC_OR_DEPARTMENT_OTHER): Payer: PPO | Admitting: *Deleted

## 2021-04-12 DIAGNOSIS — C182 Malignant neoplasm of ascending colon: Secondary | ICD-10-CM | POA: Diagnosis not present

## 2021-04-12 MED ORDER — CAPECITABINE 500 MG PO TABS
ORAL_TABLET | ORAL | 0 refills | Status: DC
  Filled 2021-04-12: qty 98, fill #0
  Filled 2021-04-13: qty 98, 21d supply, fill #0

## 2021-04-12 NOTE — Telephone Encounter (Signed)
Oral Oncology Patient Advocate Encounter  After completing a benefits investigation, prior authorization for Xeloda is not required at this time through [employer].  Patient's copay is $20.89  [place] Patient [employer] Phone [phone] Fax [phone] 04/12/2021 10:02 AM

## 2021-04-12 NOTE — Telephone Encounter (Signed)
Oral Oncology Pharmacist Encounter  Received new prescription for Xeloda (capecitabine) for the treatment of stage III colon cancer, planned duration ~6 months. Planned start 04/21/21.  BMP/CBC from 04/13/21 assessed, no relevant lab abnormalities. Prescription dose and frequency assessed.   Current medication list in Epic reviewed, a few DDIs with capecitabine identified: Tikosyn: QT-prolonging agents with highest risk like Tikosyn may enhance the QTc-prolonging effect of fluorouracil products. Patient should be monitored to QTc prolongation. ECG on 02/12/21 showed QTc of 457ms. Recommending checking a new baseline ECG on 04/21/21, repeating an ECG once capecitabine has been started (1-2 weeks into treatment), then periodically while on treatment. (Dr. Benay Spice messaged about the DDI) Pantoprazole: Proton Pump Inhibitors (PPI) may diminish the therapeutic effect of capecitabine, varying information on the clinical impact. Recommend evaluating the need for a PPI/acid suppression. If acid suppression is needed, attempt switching to a H2 antagonist (eg, famotidine) if possible.  Evaluated chart and no patient barriers to medication adherence identified.   Prescription has been e-scribed to the [facility] for benefits analysis and approval.  Oral Oncology Clinic will continue to follow for insurance authorization, copayment issues, initial counseling and start date.  Patient agreed to treatment on 04/07/21 per MD documentation.  Darl Pikes, PharmD, BCPS, BCOP, CPP Hematology/Oncology Clinical Pharmacist Practitioner [facility] [phone]  04/12/2021 11:09 AM

## 2021-04-13 ENCOUNTER — Other Ambulatory Visit: Payer: Self-pay | Admitting: Pharmacist

## 2021-04-13 ENCOUNTER — Other Ambulatory Visit (HOSPITAL_COMMUNITY): Payer: Self-pay

## 2021-04-13 ENCOUNTER — Encounter: Payer: Self-pay | Admitting: *Deleted

## 2021-04-13 NOTE — Telephone Encounter (Signed)
Oral Chemotherapy Pharmacist Encounter  [facility] will deliver medication on 04/15/21. He knows the plan is not to get started until he is seen in the office on 04/21/21  Patient Education I spoke with patient for overview of new oral chemotherapy medication: Xeloda (capecitabine) for the treatment of stage III colon cancer, planned duration ~6 months. Planned start 04/21/21.  Pt is doing well. Counseled patient on administration, dosing, side effects, monitoring, drug-food interactions, safe handling, storage, and disposal. Patient will take 4 tablets (2000 mg) by mouth in AM and 3 tablets (1500 mg) in PM. Take with food. Take for 14 days, then hold for 7 days. Repeat every 21 days.  Side effects include but not limited to: diarrhea, hand-foot syndrome, mouth sores, edema, decreased wbc, fatigue, N/V.   Diarrhea: the pharmacy will mail him loperamide alond with his capecitabine so he has it on hand to use as needed. Hand-foot syndrome: Patient knows to keep his hands and feet moisturized while on treatment and to report any skin changes he notices Mouth sores: patient will call to obtain magic mouthwash if needed   DDIs Patient has stopped his pantoprazole (removed from med list). He is having a bit a reflux, discussed alternative he could use such as famotidine, calcium carbonate, or pepto-bismol Reviewed interaction with Tikosyn, he knows he will undergo more cardiac monitoring while on both medications. A baseline ECG has been ordered for his visit on 04/21/21. He also reported stopped his multivitamins (removed from med list)  Reviewed with patient importance of keeping a medication schedule and plan for any missed doses.  After discussion with patient no patient barriers to medication adherence identified.   Mr. Kitt voiced understanding and appreciation. All questions answered. Medication handout and calendar provided.  Provided patient with [facility] phone number. Patient knows to call the office with questions or concerns. Oral Chemotherapy Navigation Clinic will continue to follow.  Darl Pikes, PharmD, BCPS, BCOP, CPP Hematology/Oncology Clinical Pharmacist Practitioner [facility] [facility] [phone]  04/13/2021 9:42 AM

## 2021-04-13 NOTE — Progress Notes (Signed)
PATIENT NAVIGATOR PROGRESS NOTE  Name: RAYSON RANDO Date: 04/13/2021 MRN: [id number]  DOB: 02/06/1933   Reason for visit:  Return phone call  Comments:  Patient describes since discontinuing Protonix that his GERD symptoms are starting to return. Instructed him per Nuala Alpha PharmD to start Famotidine in place of Protonix.     Time spent counseling/coordinating care: < 15 minutes

## 2021-04-13 NOTE — Progress Notes (Unsigned)
Oral Chemotherapy Pharmacist Encounter  Donald [name] will deliver medication on 04/15/21. Donald [name] knows the plan is not to get started until Donald [name] is seen in the office on 04/21/21  Patient Education I spoke with patient for overview of new oral chemotherapy medication: Xeloda (capecitabine) for the treatment of stage III colon cancer, planned duration ~6 months. Planned start 04/21/21.  Pt is doing well. Counseled patient on administration, dosing, side effects, monitoring, drug-food interactions, safe handling, storage, and disposal. Patient will take 4 tablets (2000 mg) by mouth in AM and 3 tablets (1500 mg) in PM. Take with food. Take for 14 days, then hold for 7 days. Repeat every 21 days.  Side effects include but not limited to: diarrhea, hand-foot syndrome, mouth sores, edema, decreased wbc, fatigue, N/V.   Diarrhea: the pharmacy will mail him loperamide alond with his capecitabine so Donald [name] has it on hand to use as needed. Hand-foot syndrome: Patient knows to keep his hands and feet moisturized while on treatment and to report any skin changes Donald [name] notices Mouth sores: patient will call to obtain magic mouthwash if needed   DDIs Patient has stopped his pantoprazole (removed from med list). Donald [name] is having a bit a reflux, discussed alternative Donald [name] could use such as famotidine, calcium carbonate, or pepto-bismol Reviewed interaction with Tikosyn, Donald [name] knows Donald [name] will undergo more cardiac monitoring while on both medications. A baseline ECG has been ordered for his visit on 04/21/21. Donald [name] also reported stopped his multivitamins (removed from med list)  Reviewed with patient importance of keeping a medication schedule and plan for any missed doses.  After discussion with patient no patient barriers to medication adherence identified.   Donald [name] voiced understanding and appreciation. All questions answered. Medication handout and calendar provided.  Provided patient with [facility] phone number. Patient knows to call the office with questions or concerns. Oral Chemotherapy Navigation Clinic will continue to follow.  Darl Pikes, PharmD, BCPS, BCOP, CPP Hematology/Oncology Clinical Pharmacist Practitioner [facility] [phone]  04/13/2021 9:42 AM

## 2021-04-14 ENCOUNTER — Other Ambulatory Visit (HOSPITAL_COMMUNITY): Payer: Self-pay

## 2021-04-14 NOTE — Telephone Encounter (Signed)
Oral Oncology Patient Advocate Encounter  I spoke with Donald [name] on 11/29 to set up delivery of Xeloda.  Address verified for shipment.  Xeloda will be filled through [facility] and mailed 11/30 for delivery 12/1.    [name] will call 7-10 days before next refill is due to complete adherence call and set up delivery of medication.     [name] Patient [place] Phone [phone] Fax [phone] 04/14/2021 12:26 PM

## 2021-04-15 ENCOUNTER — Other Ambulatory Visit (HOSPITAL_COMMUNITY): Payer: Self-pay

## 2021-04-21 ENCOUNTER — Encounter: Payer: Self-pay | Admitting: Nurse Practitioner

## 2021-04-21 ENCOUNTER — Inpatient Hospital Stay: Payer: PPO | Admitting: Nurse Practitioner

## 2021-04-21 ENCOUNTER — Inpatient Hospital Stay: Payer: PPO | Attending: Oncology

## 2021-04-21 ENCOUNTER — Other Ambulatory Visit: Payer: Self-pay

## 2021-04-21 VITALS — BP 121/68 | HR 63 | Temp 97.8°F | Resp 20 | Ht 69.0 in | Wt 219.6 lb

## 2021-04-21 DIAGNOSIS — K7689 Other specified diseases of liver: Secondary | ICD-10-CM | POA: Diagnosis not present

## 2021-04-21 DIAGNOSIS — J449 Chronic obstructive pulmonary disease, unspecified: Secondary | ICD-10-CM | POA: Diagnosis not present

## 2021-04-21 DIAGNOSIS — J849 Interstitial pulmonary disease, unspecified: Secondary | ICD-10-CM | POA: Insufficient documentation

## 2021-04-21 DIAGNOSIS — Z7901 Long term (current) use of anticoagulants: Secondary | ICD-10-CM | POA: Insufficient documentation

## 2021-04-21 DIAGNOSIS — C182 Malignant neoplasm of ascending colon: Secondary | ICD-10-CM

## 2021-04-21 DIAGNOSIS — G4733 Obstructive sleep apnea (adult) (pediatric): Secondary | ICD-10-CM | POA: Insufficient documentation

## 2021-04-21 DIAGNOSIS — C180 Malignant neoplasm of cecum: Secondary | ICD-10-CM | POA: Insufficient documentation

## 2021-04-21 DIAGNOSIS — I5032 Chronic diastolic (congestive) heart failure: Secondary | ICD-10-CM | POA: Insufficient documentation

## 2021-04-21 DIAGNOSIS — I4891 Unspecified atrial fibrillation: Secondary | ICD-10-CM | POA: Diagnosis not present

## 2021-04-21 DIAGNOSIS — D509 Iron deficiency anemia, unspecified: Secondary | ICD-10-CM | POA: Diagnosis not present

## 2021-04-21 LAB — CBC WITH DIFFERENTIAL (CANCER CENTER ONLY)
Abs Immature Granulocytes: 0.03 10*3/uL (ref 0.00–0.07)
Basophils Absolute: 0.1 10*3/uL (ref 0.0–0.1)
Basophils Relative: 1 %
Eosinophils Absolute: 0.2 10*3/uL (ref 0.0–0.5)
Eosinophils Relative: 3 %
HCT: 35.1 % — ABNORMAL LOW (ref 39.0–52.0)
Hemoglobin: 11.1 g/dL — ABNORMAL LOW (ref 13.0–17.0)
Immature Granulocytes: 0 %
Lymphocytes Relative: 18 %
Lymphs Abs: 1.2 10*3/uL (ref 0.7–4.0)
MCH: 29.6 pg (ref 26.0–34.0)
MCHC: 31.6 g/dL (ref 30.0–36.0)
MCV: 93.6 fL (ref 80.0–100.0)
Monocytes Absolute: 0.7 10*3/uL (ref 0.1–1.0)
Monocytes Relative: 10 %
Neutro Abs: 4.7 10*3/uL (ref 1.7–7.7)
Neutrophils Relative %: 68 %
Platelet Count: 205 10*3/uL (ref 150–400)
RBC: 3.75 MIL/uL — ABNORMAL LOW (ref 4.22–5.81)
RDW: 19.5 % — ABNORMAL HIGH (ref 11.5–15.5)
WBC Count: 6.9 10*3/uL (ref 4.0–10.5)
nRBC: 0 % (ref 0.0–0.2)

## 2021-04-21 LAB — CMP (CANCER CENTER ONLY)
ALT: 21 U/L (ref 0–44)
AST: 20 U/L (ref 15–41)
Albumin: 4.1 g/dL (ref 3.5–5.0)
Alkaline Phosphatase: 76 U/L (ref 38–126)
Anion gap: 8 (ref 5–15)
BUN: 13 mg/dL (ref 8–23)
CO2: 29 mmol/L (ref 22–32)
Calcium: 9.9 mg/dL (ref 8.9–10.3)
Chloride: 106 mmol/L (ref 98–111)
Creatinine: 0.95 mg/dL (ref 0.61–1.24)
GFR, Estimated: >60 mL/min (ref >60)
Glucose, Bld: 96 mg/dL (ref 70–99)
Potassium: 4.5 mmol/L (ref 3.5–5.1)
Sodium: 143 mmol/L (ref 135–145)
Total Bilirubin: 0.6 mg/dL (ref 0.3–1.2)
Total Protein: 6.4 g/dL — ABNORMAL LOW (ref 6.5–8.1)

## 2021-04-21 LAB — CEA (ACCESS): CEA (CHCC): 1.37 ng/mL (ref 0.00–5.00)

## 2021-04-21 NOTE — Progress Notes (Signed)
  [place] [facility] OFFICE PROGRESS NOTE   Diagnosis:  Colon cancer  INTERVAL HISTORY:   Mr. Oyama returns as scheduled.  He in general is feeling well.  No nausea or vomiting.  Bowels moving regularly.  Good appetite.  Objective:  Vital signs in last 24 hours:  Blood pressure 121/68, pulse 63, temperature 97.8 F (36.6 C), temperature source Oral, resp. rate 20, height $RemoveBe'5\' 9"'etjmRPgUS$  (1.753 m), weight 219 lb 9.6 oz (99.6 kg), SpO2 98 %.    HEENT: No thrush or ulcers. Resp: Lungs clear bilaterally. Cardio: Regular rate and rhythm. GI: Abdomen soft and nontender.  No hepatomegaly.  Healed low transverse incision. Vascular: No leg edema.  Lab Results:  Lab Results  Component Value Date   WBC 6.9 04/21/2021   HGB 11.1 (L) 04/21/2021   HCT 35.1 (L) 04/21/2021   MCV 93.6 04/21/2021   PLT 205 04/21/2021   NEUTROABS 4.7 04/21/2021    Imaging:  No results found.  Medications: I have reviewed the patient's current medications.  Assessment/Plan: Colon cancer-stage III (T3N1), cecum mass biopsy 02/03/2021-adenocarcinoma CTs 02/16/2021-stable interstitial lung disease, mild COPD, cardiomegaly, CAD, multiple liver cysts with a few subcentimeter too small to characterize liver lesions, circumferential wall thickening at the ileocecal valve, right lower quadrant ileocolic lymph node measures 1.1 cm 03/24/2021, robotic assisted laparoscopic right colectomy-T3, 1/8 lymph nodes, 1 tumor deposit, lymphovascular invasion, mismatch repair protein expression intact, MSS Cycle 1 adjuvant Xeloda 04/21/2021 CAD Interstitial lung disease Iron deficiency anemia secondary to #1 Multiple colon polyps on the colonoscopy 02/03/2021-microscopic fragments of adenocarcinoma involving biopsies of transverse and ascending colon polyps felt to be contaminant from the cecum biopsy 2 tubular adenomas on the right colectomy specimen 03/24/2021 OSA Sick sinus syndrome-pacemaker Atrial fibrillation-maintained on  apixaban Chronic diastolic CHF History of multiple skin cancers  Disposition: Mr. Christoffersen appears stable.  He is scheduled to begin cycle 1 adjuvant Xeloda today.  We again reviewed potential toxicities.  He agrees to proceed.  We will obtain a baseline EKG today.  He will return for a follow-up EKG on 04/30/2021.  CBC from today reviewed.  Counts adequate to proceed as above.  Hemoglobin is better.  We will see him in follow-up on 05/07/2021.    Ned Card ANP/GNP-BC   04/21/2021  8:48 AM

## 2021-04-22 ENCOUNTER — Encounter: Payer: Self-pay | Admitting: *Deleted

## 2021-04-22 NOTE — Progress Notes (Signed)
PATIENT NAVIGATOR PROGRESS NOTE  Name: Donald [name] Date: 04/22/2021 MRN: [id number]  DOB: October 22, 1932   Reason for visit:  Phone call after starting Capecitabine therapy  Comments:  Spoke with Donald [name] as he started new Capecitabine therapy. He is tolerating well. We discussed use of Udderly Smooth cream twice a day to hands and feet. Encouraged to call with any issues or questions    Time spent counseling/coordinating care: < 15 minutes

## 2021-04-23 ENCOUNTER — Other Ambulatory Visit (HOSPITAL_BASED_OUTPATIENT_CLINIC_OR_DEPARTMENT_OTHER): Payer: PPO

## 2021-04-26 ENCOUNTER — Telehealth: Payer: PPO

## 2021-04-26 ENCOUNTER — Encounter: Payer: Self-pay | Admitting: Internal Medicine

## 2021-04-27 ENCOUNTER — Other Ambulatory Visit: Payer: Self-pay | Admitting: Oncology

## 2021-04-27 ENCOUNTER — Other Ambulatory Visit (HOSPITAL_COMMUNITY): Payer: Self-pay

## 2021-04-27 DIAGNOSIS — C44712 Basal cell carcinoma of skin of right lower limb, including hip: Secondary | ICD-10-CM | POA: Diagnosis not present

## 2021-04-27 DIAGNOSIS — D2262 Melanocytic nevi of left upper limb, including shoulder: Secondary | ICD-10-CM | POA: Diagnosis not present

## 2021-04-27 DIAGNOSIS — L821 Other seborrheic keratosis: Secondary | ICD-10-CM | POA: Diagnosis not present

## 2021-04-27 DIAGNOSIS — Z85828 Personal history of other malignant neoplasm of skin: Secondary | ICD-10-CM | POA: Diagnosis not present

## 2021-04-27 DIAGNOSIS — D225 Melanocytic nevi of trunk: Secondary | ICD-10-CM | POA: Diagnosis not present

## 2021-04-27 DIAGNOSIS — C182 Malignant neoplasm of ascending colon: Secondary | ICD-10-CM

## 2021-04-27 DIAGNOSIS — L918 Other hypertrophic disorders of the skin: Secondary | ICD-10-CM | POA: Diagnosis not present

## 2021-04-27 DIAGNOSIS — L738 Other specified follicular disorders: Secondary | ICD-10-CM | POA: Diagnosis not present

## 2021-04-27 NOTE — Telephone Encounter (Signed)
Will call patient on 12/19 to evaluate tolerance prior to refill. Just started his current cycle on 12/07

## 2021-04-30 ENCOUNTER — Other Ambulatory Visit (HOSPITAL_COMMUNITY): Payer: Self-pay

## 2021-04-30 ENCOUNTER — Inpatient Hospital Stay: Payer: PPO | Admitting: *Deleted

## 2021-04-30 ENCOUNTER — Other Ambulatory Visit: Payer: Self-pay

## 2021-04-30 VITALS — BP 102/59 | HR 69 | Temp 97.7°F | Resp 20 | Wt 218.4 lb

## 2021-04-30 DIAGNOSIS — C180 Malignant neoplasm of cecum: Secondary | ICD-10-CM | POA: Diagnosis not present

## 2021-04-30 DIAGNOSIS — C182 Malignant neoplasm of ascending colon: Secondary | ICD-10-CM

## 2021-04-30 MED ORDER — CAPECITABINE 500 MG PO TABS
ORAL_TABLET | ORAL | 0 refills | Status: DC
  Filled 2021-04-30: qty 98, 21d supply, fill #0

## 2021-04-30 NOTE — Progress Notes (Signed)
EKG performed as ordered.  QT/QTc today 450/450--stable. Patient reports he is tolerating his Xeloda well.

## 2021-05-06 ENCOUNTER — Other Ambulatory Visit: Payer: Self-pay | Admitting: *Deleted

## 2021-05-06 DIAGNOSIS — C182 Malignant neoplasm of ascending colon: Secondary | ICD-10-CM

## 2021-05-07 ENCOUNTER — Inpatient Hospital Stay: Payer: PPO

## 2021-05-07 ENCOUNTER — Inpatient Hospital Stay: Payer: PPO | Admitting: Oncology

## 2021-05-07 ENCOUNTER — Other Ambulatory Visit: Payer: Self-pay

## 2021-05-07 VITALS — BP 118/59 | HR 76 | Temp 98.1°F | Resp 20 | Ht 69.0 in | Wt 219.2 lb

## 2021-05-07 DIAGNOSIS — C182 Malignant neoplasm of ascending colon: Secondary | ICD-10-CM | POA: Diagnosis not present

## 2021-05-07 DIAGNOSIS — C180 Malignant neoplasm of cecum: Secondary | ICD-10-CM | POA: Diagnosis not present

## 2021-05-07 LAB — CMP (CANCER CENTER ONLY)
ALT: 17 U/L (ref 0–44)
AST: 27 U/L (ref 15–41)
Albumin: 4.1 g/dL (ref 3.5–5.0)
Alkaline Phosphatase: 63 U/L (ref 38–126)
Anion gap: 8 (ref 5–15)
BUN: 17 mg/dL (ref 8–23)
CO2: 29 mmol/L (ref 22–32)
Calcium: 9.4 mg/dL (ref 8.9–10.3)
Chloride: 103 mmol/L (ref 98–111)
Creatinine: 1.02 mg/dL (ref 0.61–1.24)
GFR, Estimated: >60 mL/min (ref >60)
Glucose, Bld: 135 mg/dL — ABNORMAL HIGH (ref 70–99)
Potassium: 4.8 mmol/L (ref 3.5–5.1)
Sodium: 140 mmol/L (ref 135–145)
Total Bilirubin: 0.6 mg/dL (ref 0.3–1.2)
Total Protein: 6.9 g/dL (ref 6.5–8.1)

## 2021-05-07 LAB — CBC WITH DIFFERENTIAL (CANCER CENTER ONLY)
Abs Immature Granulocytes: 0.02 10*3/uL (ref 0.00–0.07)
Basophils Absolute: 0.1 10*3/uL (ref 0.0–0.1)
Basophils Relative: 1 %
Eosinophils Absolute: 0.2 10*3/uL (ref 0.0–0.5)
Eosinophils Relative: 2 %
HCT: 36.6 % — ABNORMAL LOW (ref 39.0–52.0)
Hemoglobin: 11.9 g/dL — ABNORMAL LOW (ref 13.0–17.0)
Immature Granulocytes: 0 %
Lymphocytes Relative: 20 %
Lymphs Abs: 1.7 10*3/uL (ref 0.7–4.0)
MCH: 31 pg (ref 26.0–34.0)
MCHC: 32.5 g/dL (ref 30.0–36.0)
MCV: 95.3 fL (ref 80.0–100.0)
Monocytes Absolute: 0.7 10*3/uL (ref 0.1–1.0)
Monocytes Relative: 8 %
Neutro Abs: 5.7 10*3/uL (ref 1.7–7.7)
Neutrophils Relative %: 69 %
Platelet Count: 236 10*3/uL (ref 150–400)
RBC: 3.84 MIL/uL — ABNORMAL LOW (ref 4.22–5.81)
RDW: 19.9 % — ABNORMAL HIGH (ref 11.5–15.5)
WBC Count: 8.3 10*3/uL (ref 4.0–10.5)
nRBC: 0 % (ref 0.0–0.2)

## 2021-05-07 NOTE — Progress Notes (Signed)
[place] OFFICE PROGRESS NOTE   Diagnosis: Colon cancer  INTERVAL HISTORY:   Donald [name] returns as scheduled.  He completed a cycle of Xeloda beginning 04/21/2021.  No mouth sores, diarrhea, or hand/foot pain.  He reports gas discomfort when he took Xeloda without eating.  Good appetite.  Objective:  Vital signs in last 24 hours:  Blood pressure (!) 118/59, pulse 76, temperature 98.1 F (36.7 C), temperature source Oral, resp. rate 20, height _0  (1.753 m), weight 219 lb 3.2 oz (99.4 kg), SpO2 98 %.    HEENT: No thrush or ulcers Resp: Lungs clear bilaterally Cardio: Regular rate and rhythm GI: No hepatosplenomegaly, mild diffuse tenderness Vascular: No leg edema  Skin: Palms without erythema  Lab Results:  Lab Results  Component Value Date   WBC 8.3 05/07/2021   HGB 11.9 (L) 05/07/2021   HCT 36.6 (L) 05/07/2021   MCV 95.3 05/07/2021   PLT 236 05/07/2021   NEUTROABS 5.7 05/07/2021    CMP  Lab Results  Component Value Date   NA 143 04/21/2021   K 4.5 04/21/2021   CL 106 04/21/2021   CO2 29 04/21/2021   GLUCOSE 96 04/21/2021   BUN 13 04/21/2021   CREATININE 0.95 04/21/2021   CALCIUM 9.9 04/21/2021   PROT 6.4 (L) 04/21/2021   ALBUMIN 4.1 04/21/2021   AST 20 04/21/2021   ALT 21 04/21/2021   ALKPHOS 76 04/21/2021   BILITOT 0.6 04/21/2021   GFRNONAA >60 04/21/2021   GFRAA >60 02/25/2019    Lab Results  Component Value Date   CEA 1.37 04/21/2021      Medications: I have reviewed the patient's current medications.   Assessment/Plan: Colon cancer-stage III (T3N1), cecum mass biopsy 02/03/2021-adenocarcinoma CTs 02/16/2021-stable interstitial lung disease, mild COPD, cardiomegaly, CAD, multiple liver cysts with a few subcentimeter too small to characterize liver lesions, circumferential wall thickening at the ileocecal valve, right lower quadrant ileocolic lymph node measures 1.1 cm 03/24/2021, robotic assisted laparoscopic right  colectomy-T3, 1/8 lymph nodes, 1 tumor deposit, lymphovascular invasion, mismatch repair protein expression intact, MSS Cycle 1 adjuvant Xeloda 04/21/2021 Cycle 2 adjuvant Xeloda 05/12/2021 CAD Interstitial lung disease Iron deficiency anemia secondary to #1 Multiple colon polyps on the colonoscopy 02/03/2021-microscopic fragments of adenocarcinoma involving biopsies of transverse and ascending colon polyps felt to be contaminant from the cecum biopsy 2 tubular adenomas on the right colectomy specimen 03/24/2021 OSA Sick sinus syndrome-pacemaker Atrial fibrillation-maintained on apixaban Chronic diastolic CHF History of multiple skin cancers    Disposition: Donald [name] appears stable.  He tolerated the first cycle of Xeloda well.  He will begin cycle 2 on 05/12/2021.  He will return for an office and lab visit on 05/31/2021.  He will take Xeloda with meals.  The hemoglobin has improved.  He continues iron therapy.  The plan is to discontinue iron therapy if the hemoglobin remains improved when he is here in 3 weeks.  Betsy Coder, MD  05/07/2021  8:18 AM

## 2021-05-09 ENCOUNTER — Other Ambulatory Visit: Payer: Self-pay | Admitting: Adult Health

## 2021-05-09 DIAGNOSIS — G4733 Obstructive sleep apnea (adult) (pediatric): Secondary | ICD-10-CM | POA: Diagnosis not present

## 2021-05-14 DIAGNOSIS — H02831 Dermatochalasis of right upper eyelid: Secondary | ICD-10-CM | POA: Diagnosis not present

## 2021-05-14 DIAGNOSIS — H04123 Dry eye syndrome of bilateral lacrimal glands: Secondary | ICD-10-CM | POA: Diagnosis not present

## 2021-05-14 DIAGNOSIS — Z961 Presence of intraocular lens: Secondary | ICD-10-CM | POA: Diagnosis not present

## 2021-05-14 DIAGNOSIS — H353131 Nonexudative age-related macular degeneration, bilateral, early dry stage: Secondary | ICD-10-CM | POA: Diagnosis not present

## 2021-05-14 DIAGNOSIS — H524 Presbyopia: Secondary | ICD-10-CM | POA: Diagnosis not present

## 2021-05-18 ENCOUNTER — Telehealth: Payer: Self-pay | Admitting: Internal Medicine

## 2021-05-18 ENCOUNTER — Ambulatory Visit (INDEPENDENT_AMBULATORY_CARE_PROVIDER_SITE_OTHER): Payer: PPO

## 2021-05-18 DIAGNOSIS — I495 Sick sinus syndrome: Secondary | ICD-10-CM

## 2021-05-18 LAB — CUP PACEART REMOTE DEVICE CHECK
Battery Remaining Longevity: 55 mo
Battery Remaining Percentage: 49 %
Battery Voltage: 2.99 V
Brady Statistic AP VP Percent: 1 %
Brady Statistic AP VS Percent: 98 %
Brady Statistic AS VP Percent: 1 %
Brady Statistic AS VS Percent: 1.6 %
Brady Statistic RA Percent Paced: 98 %
Brady Statistic RV Percent Paced: 1 %
Date Time Interrogation Session: 20230103032420
Implantable Lead Implant Date: 20171002
Implantable Lead Implant Date: 20171002
Implantable Lead Location: 753859
Implantable Lead Location: 753860
Implantable Pulse Generator Implant Date: 20171002
Lead Channel Impedance Value: 380 Ohm
Lead Channel Impedance Value: 450 Ohm
Lead Channel Pacing Threshold Amplitude: 0.625 V
Lead Channel Pacing Threshold Amplitude: 0.75 V
Lead Channel Pacing Threshold Pulse Width: 0.5 ms
Lead Channel Pacing Threshold Pulse Width: 0.5 ms
Lead Channel Sensing Intrinsic Amplitude: 12 mV
Lead Channel Sensing Intrinsic Amplitude: 2.5 mV
Lead Channel Setting Pacing Amplitude: 1.625
Lead Channel Setting Pacing Amplitude: 2.5 V
Lead Channel Setting Pacing Pulse Width: 0.5 ms
Lead Channel Setting Sensing Sensitivity: 2 mV
Pulse Gen Model: 2272
Pulse Gen Serial Number: 7951215

## 2021-05-18 NOTE — Telephone Encounter (Signed)
Pt c/o medication issue:  1. Name of Medication: dofetilide (TIKOSYN) 500 MCG capsule  2. How are you currently taking this medication (dosage and times per day)? As directed  3. Are you having a reaction (difficulty breathing--STAT)? N/a  4. What is your medication issue? Donald [name] with [employer] left a message with our after hours service yesterday stated that the patient is requesting tier acception for this medication. A supporting statement is needed as well.

## 2021-05-19 NOTE — Telephone Encounter (Signed)
**Note De-Identified  Obfuscation** While s/w Lennette Bihari at [facility] at [phone] concerning another HTA pt, I requested that we do this Dofetilide tier exception over the phone as well as the number provided for this call was a non working number Lendon Ka at [phone]).  Lennette Bihari asked which medications the pt has tried and failed prior to taking Dofetilide and I provided that information to him.  Per Lennette Bihari he is sending this request to their processing team and that they will fax [place] their determination once determined.

## 2021-05-20 ENCOUNTER — Encounter: Payer: Self-pay | Admitting: Internal Medicine

## 2021-05-21 ENCOUNTER — Telehealth: Payer: Self-pay | Admitting: Internal Medicine

## 2021-05-21 NOTE — Telephone Encounter (Signed)
**Note De-Identified  Obfuscation** You Just now (11:05 AM)   I called [facility] to notify them of this approval but was advised by Donald [name] that they are in the process of transferring the pts Dofetilide RX to [facility] and that I should let them know about this approval.   I called [facility] [place] and did advise Donald [name], the pharmacist of this approval but he stated that he cannot run the RX yet as the pts RX is still in the transfer process from [employer].   He is aware to call Donald [name] at [facility] at [phone] if he has any issues with the pts Dofetilide coverage.          Note    Donald Iba, Donald [name] routed conversation to You 1 hour ago (9:40 AM)   Donald [name] routed conversation to [employer] Triage 1 hour ago (9:37 AM)   Donald [name] 1 hour ago (9:37 AM)   SS Calling to give patient authorization for the medication dofetilide (TIKOSYN) 500 MCG capsule. Date of approval 05/19/2021 until 04/19/2023. Please advise      Note    [employer] [phone]  Donald [name]

## 2021-05-21 NOTE — Telephone Encounter (Signed)
**Note De-Identified  Obfuscation** I called [facility] to notify them of this approval but was advised by Hildred Alamin that they are in the process of transferring the pts Dofetilide RX to [facility] and that I should let them know about this approval.  I called [facility] and did advise Ronalee Belts, the pharmacist of this approval but he stated that he cannot run the RX yet as the pts RX is still in the transfer process from [employer].  He is aware to call Jeani Hawking at [facility] at [phone] if he has any issues with the pts Dofetilide coverage.

## 2021-05-21 NOTE — Telephone Encounter (Signed)
Calling to give patient authorization for the medication dofetilide (TIKOSYN) 500 MCG capsule. Date of approval 05/19/2021 until 04/19/2023. Please advise

## 2021-05-24 ENCOUNTER — Other Ambulatory Visit (HOSPITAL_COMMUNITY): Payer: Self-pay

## 2021-05-24 ENCOUNTER — Other Ambulatory Visit: Payer: Self-pay | Admitting: Oncology

## 2021-05-24 DIAGNOSIS — C182 Malignant neoplasm of ascending colon: Secondary | ICD-10-CM

## 2021-05-24 MED ORDER — CAPECITABINE 500 MG PO TABS
ORAL_TABLET | ORAL | 0 refills | Status: DC
  Filled 2021-05-24: qty 98, 21d supply, fill #0

## 2021-05-26 ENCOUNTER — Other Ambulatory Visit: Payer: Self-pay

## 2021-05-26 ENCOUNTER — Non-Acute Institutional Stay: Payer: PPO | Admitting: Internal Medicine

## 2021-05-26 ENCOUNTER — Encounter: Payer: Self-pay | Admitting: Internal Medicine

## 2021-05-26 VITALS — BP 118/60 | HR 62 | Temp 99.0°F | Ht 69.0 in | Wt 219.8 lb

## 2021-05-26 DIAGNOSIS — C182 Malignant neoplasm of ascending colon: Secondary | ICD-10-CM

## 2021-05-26 DIAGNOSIS — J849 Interstitial pulmonary disease, unspecified: Secondary | ICD-10-CM | POA: Diagnosis not present

## 2021-05-26 DIAGNOSIS — I480 Paroxysmal atrial fibrillation: Secondary | ICD-10-CM

## 2021-05-26 DIAGNOSIS — R21 Rash and other nonspecific skin eruption: Secondary | ICD-10-CM

## 2021-05-26 DIAGNOSIS — I10 Essential (primary) hypertension: Secondary | ICD-10-CM

## 2021-05-26 DIAGNOSIS — G4733 Obstructive sleep apnea (adult) (pediatric): Secondary | ICD-10-CM

## 2021-05-26 DIAGNOSIS — N528 Other male erectile dysfunction: Secondary | ICD-10-CM | POA: Diagnosis not present

## 2021-05-26 DIAGNOSIS — E785 Hyperlipidemia, unspecified: Secondary | ICD-10-CM | POA: Diagnosis not present

## 2021-05-26 DIAGNOSIS — I5032 Chronic diastolic (congestive) heart failure: Secondary | ICD-10-CM | POA: Diagnosis not present

## 2021-05-26 DIAGNOSIS — I2510 Atherosclerotic heart disease of native coronary artery without angina pectoris: Secondary | ICD-10-CM | POA: Diagnosis not present

## 2021-05-26 DIAGNOSIS — D500 Iron deficiency anemia secondary to blood loss (chronic): Secondary | ICD-10-CM

## 2021-05-26 NOTE — Progress Notes (Signed)
Location:  [place] of Service:  Clinic (12)  Provider:   Code Status:  Goals of Care:  Advanced Directives 05/26/2021  Does Patient Have a Medical Advance Directive? Yes  Type of Paramedic of [place];Living will  Does patient want to make changes to medical advance directive? No - Patient declined  Copy of [place] in Chart? Yes - validated most recent copy scanned in chart (See row information)  Would patient like information on creating a medical advance directive? -  Pre-existing out of facility DNR order (yellow form or pink MOST form) -     Chief Complaint  Patient presents with   Medical Management of Chronic Issues    Patient returns to the clinic for follow up and to meet Dr. Lyndel Safe. He would like to discuss her chemotherapy and ED.    Quality Metric Gaps    Patient has had 5 covid vaccines according to matrix.     HPI: Patient is a 88 y.o. male seen today for medical management of chronic diseases.    Patient has h/o  Colon Cancer stage T3 N1 s/p 03/24/2021, robotic assisted laparoscopic right colectomy On Xeloda since 12/07 Doing well . Just c/o Dry mouth and skin. Also Fatigue Also has this rash in his arms and Chest. No itching CAD. On Imdur No chest Pain H/o A Fib on Eliquis IAD Follows with Pulmonary  Not on Oxygen Chronic Diastolic CHF, H/o H:LD, Hypertension, OSA  Continues to do well. Does c/o Fatigue Appetite is good. Weight is stable. Walks with his cane.  No Falls.   Only c/o Erectile dysfunction. Going on for 10 years. But wants to [facility] if he should try any meds Past Medical History:  Diagnosis Date   Abnormal CT scan, stomach 02/2014   Thickening of gastric fundus and cardia.  gastritis on EGD 03/2014   Allergic rhinitis due to pollen    Anemia 12/2020   Arthropathy, unspecified, site unspecified    Atherosclerosis    a. Noted by abdominal CT 02/2014 (h/o normal  nuc 2011).   Atrial flutter ([place]) 02/24/2014   s/p ablation 11/15   Cardiomyopathy ([place])    tachy mediated - a. TEE (10/15):  EF 30%;  b. Echo after NSR restored (10/15):  mild LVH, EF 55-60%, mild AS, mild AI, mild MR, mild to mod LAE, mild RAE   CHF (congestive heart failure) ([place]) 01/04/2021   COVID-19 11/2020   Diverticulosis    severe in descending, sigmoid colon.    ED (erectile dysfunction)    Emphysema of lung ([place])    Esophageal stricture 03/2014   traversable with endoscope. not dilated.    Gastric AVM    GERD (gastroesophageal reflux disease) 03/2014   small HH and gastritis on EGD   GI bleed    Habitual alcohol use    Hemorrhoids    Hiatal hernia    Hyperlipidemia    Hypertension    Obesity    OSA on CPAP    PAF (paroxysmal atrial fibrillation) ([place])    Pneumonia 12/2020   mild with covid   Presence of permanent cardiac pacemaker    Prostatitis, unspecified    Pulmonary fibrosis (HCC)    Sinus bradycardia    a. s/p STJ dual chamber PPM   Skin cancer    basal and squamous cell   Sleep apnea     Past Surgical History:  Procedure Laterality Date   ATRIAL  FLUTTER ABLATION N/A 03/19/2014   RFCA of atrial flutter by Dr Caryl Comes   CARDIOVERSION N/A 02/24/2014   Procedure: CARDIOVERSION;  Surgeon: Lelon Perla, MD;  Location: [facility];  Service: Cardiovascular;  Laterality: N/A;   CARDIOVERSION Right 09/10/2014   Procedure: CARDIOVERSION;  Surgeon: Deboraha Sprang, MD;  Location: [facility] CATH LAB;  Service: Cardiovascular;  Laterality: Right;   CARDIOVERSION N/A 01/22/2018   Procedure: CARDIOVERSION;  Surgeon: Fay Records, MD;  Location: [facility] ENDOSCOPY;  Service: Cardiovascular;  Laterality: N/A;   COLONOSCOPY N/A 04/23/2015   Procedure: COLONOSCOPY;  Surgeon: Ladene Artist, MD;  Location: [facility] ENDOSCOPY;  Service: Endoscopy;  Laterality: N/A;   COLONOSCOPY     ENTEROSCOPY N/A 06/12/2015   Procedure: ENTEROSCOPY;  Surgeon: Ladene Artist, MD;  Location: [facility]  ENDOSCOPY;  Service: Endoscopy;  Laterality: N/A;   EP IMPLANTABLE DEVICE N/A 02/15/2016   Procedure: Pacemaker Implant;  Surgeon: Evans Lance, MD;  Location: [place] CV LAB;  Service: Cardiovascular;  Laterality: N/A;   ESOPHAGOGASTRODUODENOSCOPY N/A 04/09/2014   Procedure: ESOPHAGOGASTRODUODENOSCOPY (EGD);  Surgeon: Inda Castle, MD;  Location: [place];  Service: Endoscopy;  Laterality: N/A;   ESOPHAGOGASTRODUODENOSCOPY N/A 04/23/2015   Procedure: ESOPHAGOGASTRODUODENOSCOPY (EGD);  Surgeon: Ladene Artist, MD;  Location: [facility] ENDOSCOPY;  Service: Endoscopy;  Laterality: N/A;   INGUINAL HERNIA REPAIR     right   INGUINAL HERNIA REPAIR     left   LEFT HEART CATH AND CORONARY ANGIOGRAPHY N/A 03/16/2017   Procedure: LEFT HEART CATH AND CORONARY ANGIOGRAPHY;  Surgeon: Burnell Blanks, MD;  Location: [place] CV LAB;  Service: Cardiovascular;  Laterality: N/A;   ORIF fracture of the elbow  1992   SKIN CANCER EXCISION  05/21/2012   Squamous cell ca   TEE WITHOUT CARDIOVERSION N/A 02/24/2014   Procedure: TRANSESOPHAGEAL ECHOCARDIOGRAM (TEE);  Surgeon: Lelon Perla, MD;  Location: [facility] ENDOSCOPY;  Service: Cardiovascular;  Laterality: N/A;   TONSILLECTOMY     UPPER GASTROINTESTINAL ENDOSCOPY      No Known Allergies  Outpatient Encounter Medications as of 05/26/2021  Medication Sig   acetaminophen (TYLENOL) 500 MG tablet Take 500 mg by mouth every 6 (six) hours as needed for moderate pain or headache.   albuterol (VENTOLIN HFA) 108 (90 Base) MCG/ACT inhaler Inhale TWO puffs into THE lungs every SIX hours as needed FOR wheezing OR shortness of breath   capecitabine (XELODA) 500 MG tablet Take 4 tablets by mouth in morning and 3 tablets in evening. Take with food. Take for 14 days, then hold for 7 days. Repeat every 21 days. Start cycle on 05/12/21   carboxymethylcellulose (REFRESH PLUS) 0.5 % SOLN Place 1 drop into both eyes daily as needed (dry eyes).   diltiazem  (CARDIZEM CD) 120 MG 24 hr capsule Take 1 capsule (120 mg total) by mouth 2 (two) times daily.   dofetilide (TIKOSYN) 500 MCG capsule Take 1 capsule (500 mcg total) by mouth 2 (two) times daily. Please keep upcoming appt in May 2022 with Dr. Caryl Comes before anymore refills. Thank you   ELIQUIS 5 MG TABS tablet TAKE ONE TABLET BY MOUTH EVERY MORNING and TAKE ONE TABLET BY MOUTH EVERYDAY AT BEDTIME   ferrous sulfate 325 (65 FE) MG tablet Take 1 tablet (325 mg total) by mouth daily with breakfast.   furosemide (LASIX) 20 MG tablet TAKE ONE TABLET BY MOUTH ONCE DAILY   isosorbide mononitrate (IMDUR) 60 MG 24 hr tablet Take 1 tablet (60 mg total)  by mouth daily.   magnesium chloride (SLOW-MAG) 64 MG TBEC SR tablet Take 64 mg by mouth at bedtime.   metoprolol tartrate (LOPRESSOR) 25 MG tablet TAKE ONE TABLET BY MOUTH EVERY MORNING and TAKE ONE TABLET EVERYDAY AT BEDTIME   nitroGLYCERIN (NITROSTAT) 0.4 MG SL tablet PLACE 1 TABLET UNDER THE TONGUE EVERY 5 MINUTES AS NEEDED FOR CHEST PAIN, MAY REPEAT FOR 3 DOSES.   Pedialyte (PEDIALYTE) SOLN Take by mouth daily. 6 oz   Probiotic Product (ALIGN PO) Take 1 capsule by mouth daily.   Propylhexedrine (BENZEDREX) INHA Place 1 each into the nose daily as needed (congestion).   rosuvastatin (CRESTOR) 10 MG tablet TAKE ONE TABLET BY MOUTH ONCE DAILY   sodium chloride (OCEAN) 0.65 % SOLN nasal spray Place 1 spray into both nostrils as needed for congestion.   Wheat Dextrin (BENEFIBER) POWD Take 1 Scoop by mouth daily as needed (constipation).   No facility-administered encounter medications on file as of 05/26/2021.    Review of Systems:  Review of Systems  Constitutional:  Positive for activity change. Negative for appetite change and unexpected weight change.  HENT: Negative.    Respiratory:  Negative for cough and shortness of breath.   Cardiovascular:  Negative for leg swelling.  Gastrointestinal:  Negative for constipation.  Genitourinary:  Negative for  frequency.  Musculoskeletal:  Positive for gait problem. Negative for arthralgias and myalgias.  Skin:  Positive for rash.  Neurological:  Positive for weakness. Negative for dizziness.  Psychiatric/Behavioral:  Negative for confusion and sleep disturbance.   All other systems reviewed and are negative.  Health Maintenance  Topic Date Due   COVID-19 Vaccine (6 - Booster for Moderna series) 04/23/2021   TETANUS/TDAP  04/16/2028   Pneumonia Vaccine 25+ Years old  Completed   INFLUENZA VACCINE  Completed   Zoster Vaccines- Shingrix  Completed   HPV VACCINES  Aged Out    Physical Exam: Vitals:   05/26/21 0912  BP: 118/60  Pulse: 62  Temp: 99 F (37.2 C)  SpO2: 97%  Weight: 219 lb 12.8 oz (99.7 kg)  Height: 5\' 9"  (1.753 m)   Body mass index is 32.46 kg/m. Physical Exam Vitals reviewed.  Constitutional:      Appearance: Normal appearance.  HENT:     Head: Normocephalic.     Mouth/Throat:     Mouth: Mucous membranes are moist.     Pharynx: Oropharynx is clear.  Eyes:     Pupils: Pupils are equal, round, and reactive to light.  Cardiovascular:     Rate and Rhythm: Normal rate and regular rhythm.     Pulses: Normal pulses.     Heart sounds: No murmur heard. Pulmonary:     Effort: Pulmonary effort is normal. No respiratory distress.     Breath sounds: Normal breath sounds. No rales.  Abdominal:     General: Abdomen is flat. Bowel sounds are normal.     Palpations: Abdomen is soft.  Musculoskeletal:     Cervical back: Neck supple.     Comments: Trace edema Bilateral  Skin:    General: Skin is warm.     Comments: Has maculopapular rash in his Arms and Chest  and Neck  Neurological:     General: No focal deficit present.     Mental Status: He is alert and oriented to person, place, and time.  Psychiatric:        Mood and Affect: Mood normal.        Thought Content:  Thought content normal.    Labs reviewed: Basic Metabolic Panel: Recent Labs    08/07/20 1250  08/07/20 1250 01/07/21 0000 02/03/21 1255 02/12/21 1202 03/08/21 0950 03/28/21 0512 04/21/21 0753 05/07/21 0740  NA 138   < > 138   138  --  142   < > 140 143 140  K 4.6  --  4.5   4.5  --  4.8   < > 4.5 4.5 4.8  CL 105  --  103   103  --  104   < > 110 106 103  CO2 26  --  23*   23*  --  22   < > 26 29 29   GLUCOSE 93  --   --   --  104*   < > 95 96 135*  BUN 18   < > 19   19   < > 16   < > 26* 13 17  CREATININE 0.98  --  1.0   1.0   < > 1.18   < > 0.81 0.95 1.02  CALCIUM 9.1  --  9.2   9.2  --  9.2   < > 9.2 9.9 9.4  MG 1.9  --   --   --  1.9  --   --   --   --   TSH  --   --  0.63  --   --   --   --   --   --    < > = values in this interval not displayed.   Liver Function Tests: Recent Labs    01/07/21 0000 04/21/21 0753 05/07/21 0740  AST 18   18 20 27   ALT 10   10 21 17   ALKPHOS 94   94 76 63  BILITOT  --  0.6 0.6  PROT  --  6.4* 6.9  ALBUMIN 4.2   4.2 4.1 4.1   No results for input(s): LIPASE, AMYLASE in the last 8760 hours. No results for input(s): AMMONIA in the last 8760 hours. CBC: Recent Labs    03/01/21 1154 03/08/21 0950 03/28/21 0512 04/21/21 0753 05/07/21 0740  WBC 8.2   < > 10.4 6.9 8.3  NEUTROABS 5.4  --   --  4.7 5.7  HGB 8.2*   < > 9.4* 11.1* 11.9*  HCT 27.0*   < > 29.8* 35.1* 36.6*  MCV 84.4   < > 89.2 93.6 95.3  PLT 295   < > 269 205 236   < > = values in this interval not displayed.   Lipid Panel: Recent Labs    01/07/21 0000  CHOL 98  HDL 38  LDLCALC 51  TRIG 50   Lab Results  Component Value Date   HGBA1C 5.5 03/08/2021    Procedures since last visit: [place]  Result Date: 05/18/2021 Scheduled remote reviewed. Normal device function.  1 AMS, 3.5hrs. [place]- Eliquis, on Tikosyn Next remote 91 days- JJB   Assessment/Plan PAF (paroxysmal atrial fibrillation) (HCC) Doing well on Cardizem Tikosyn and Eliquis Follows with Cardiology Iron deficiency anemia due to chronic blood loss On Iron Infusions/ PO  Iron per Oncology Hgb stable Essential hypertension BP stable on Cardizem and Metoprolol Chronic diastolic congestive heart failure (HCC) On Low dose of Lasix  No Edema OSA (obstructive sleep apnea) Tolerating CPAP Hyperlipidemia with target LDL less than 100 Continue statin CAD On Imdur Lopressor and statin Male erectile dysfunction Discussed. At this time due  to having CAD cannot do many meds He will talk to Cardiology.  Rash Due to Chemo Will talk to Oncology ILD (interstitial lung disease) ([place]) Continues to do well Malignant neoplasm of ascending colon ([place]) Has recovered well from Surgery. Tolerating Chemo   Labs/tests ordered:  * No order type specified * Next appt:  11/22/2021

## 2021-05-27 ENCOUNTER — Other Ambulatory Visit (HOSPITAL_COMMUNITY): Payer: Self-pay

## 2021-05-27 DIAGNOSIS — G4733 Obstructive sleep apnea (adult) (pediatric): Secondary | ICD-10-CM | POA: Diagnosis not present

## 2021-05-28 ENCOUNTER — Other Ambulatory Visit (HOSPITAL_COMMUNITY): Payer: Self-pay

## 2021-05-28 NOTE — Progress Notes (Signed)
Remote pacemaker transmission.   

## 2021-05-31 ENCOUNTER — Telehealth: Payer: Self-pay | Admitting: Oncology

## 2021-05-31 ENCOUNTER — Encounter: Payer: Self-pay | Admitting: Nurse Practitioner

## 2021-05-31 ENCOUNTER — Inpatient Hospital Stay: Payer: PPO | Attending: Oncology

## 2021-05-31 ENCOUNTER — Other Ambulatory Visit: Payer: Self-pay

## 2021-05-31 ENCOUNTER — Inpatient Hospital Stay: Payer: PPO | Admitting: Nurse Practitioner

## 2021-05-31 ENCOUNTER — Other Ambulatory Visit (HOSPITAL_COMMUNITY): Payer: Self-pay

## 2021-05-31 VITALS — BP 114/66 | HR 76 | Temp 98.1°F | Resp 19 | Ht 69.0 in | Wt 219.0 lb

## 2021-05-31 DIAGNOSIS — C180 Malignant neoplasm of cecum: Secondary | ICD-10-CM | POA: Diagnosis not present

## 2021-05-31 DIAGNOSIS — C182 Malignant neoplasm of ascending colon: Secondary | ICD-10-CM

## 2021-05-31 LAB — CBC WITH DIFFERENTIAL (CANCER CENTER ONLY)
Abs Immature Granulocytes: 0.03 10*3/uL (ref 0.00–0.07)
Basophils Absolute: 0.1 10*3/uL (ref 0.0–0.1)
Basophils Relative: 1 %
Eosinophils Absolute: 0.3 10*3/uL (ref 0.0–0.5)
Eosinophils Relative: 3 %
HCT: 36.6 % — ABNORMAL LOW (ref 39.0–52.0)
Hemoglobin: 12.3 g/dL — ABNORMAL LOW (ref 13.0–17.0)
Immature Granulocytes: 0 %
Lymphocytes Relative: 21 %
Lymphs Abs: 1.9 10*3/uL (ref 0.7–4.0)
MCH: 32.8 pg (ref 26.0–34.0)
MCHC: 33.6 g/dL (ref 30.0–36.0)
MCV: 97.6 fL (ref 80.0–100.0)
Monocytes Absolute: 0.8 10*3/uL (ref 0.1–1.0)
Monocytes Relative: 9 %
Neutro Abs: 5.8 10*3/uL (ref 1.7–7.7)
Neutrophils Relative %: 66 %
Platelet Count: 252 10*3/uL (ref 150–400)
RBC: 3.75 MIL/uL — ABNORMAL LOW (ref 4.22–5.81)
RDW: 20.5 % — ABNORMAL HIGH (ref 11.5–15.5)
WBC Count: 8.9 10*3/uL (ref 4.0–10.5)
nRBC: 0 % (ref 0.0–0.2)

## 2021-05-31 LAB — CMP (CANCER CENTER ONLY)
ALT: 17 U/L (ref 0–44)
AST: 20 U/L (ref 15–41)
Albumin: 4.1 g/dL (ref 3.5–5.0)
Alkaline Phosphatase: 66 U/L (ref 38–126)
Anion gap: 8 (ref 5–15)
BUN: 21 mg/dL (ref 8–23)
CO2: 26 mmol/L (ref 22–32)
Calcium: 9.3 mg/dL (ref 8.9–10.3)
Chloride: 103 mmol/L (ref 98–111)
Creatinine: 1 mg/dL (ref 0.61–1.24)
GFR, Estimated: >60 mL/min (ref >60)
Glucose, Bld: 106 mg/dL — ABNORMAL HIGH (ref 70–99)
Potassium: 4.6 mmol/L (ref 3.5–5.1)
Sodium: 137 mmol/L (ref 135–145)
Total Bilirubin: 0.5 mg/dL (ref 0.3–1.2)
Total Protein: 6.8 g/dL (ref 6.5–8.1)

## 2021-05-31 NOTE — Progress Notes (Signed)
[place] OFFICE PROGRESS NOTE   Diagnosis: Colon cancer  INTERVAL HISTORY:   Mr. Pettet returns as scheduled.  He completed cycle 2 adjuvant Xeloda beginning 05/12/2021.  He denies nausea/vomiting.  No mouth sores.  No diarrhea.  He developed discomfort over the hands and feet as well as dry skin and "cracks" around the fingernails during week 2.  The discomfort and skin changes are improving.  Objective:  Vital signs in last 24 hours:  Blood pressure 114/66, pulse 76, temperature 98.1 F (36.7 C), temperature source Oral, resp. rate 19, height _0  (1.753 m), weight 219 lb (99.3 kg).    HEENT: No thrush or ulcers. Resp: Lungs clear bilaterally. Cardio: Regular rate and rhythm. GI: Abdomen soft and nontender.  No hepatomegaly. Vascular: No leg edema. Skin: Palms and soles with erythema, dryness.  No skin breakdown.  Erythematous rash over the forearms.   Lab Results:  Lab Results  Component Value Date   WBC 8.9 05/31/2021   HGB 12.3 (L) 05/31/2021   HCT 36.6 (L) 05/31/2021   MCV 97.6 05/31/2021   PLT 252 05/31/2021   NEUTROABS 5.8 05/31/2021    Imaging:  No results found.  Medications: I have reviewed the patient's current medications.  Assessment/Plan: Colon cancer-stage III (T3N1), cecum mass biopsy 02/03/2021-adenocarcinoma CTs 02/16/2021-stable interstitial lung disease, mild COPD, cardiomegaly, CAD, multiple liver cysts with a few subcentimeter too small to characterize liver lesions, circumferential wall thickening at the ileocecal valve, right lower quadrant ileocolic lymph node measures 1.1 cm 03/24/2021, robotic assisted laparoscopic right colectomy-T3, 1/8 lymph nodes, 1 tumor deposit, lymphovascular invasion, mismatch repair protein expression intact, MSS Cycle 1 adjuvant Xeloda 04/21/2021 Cycle 2 adjuvant Xeloda 05/12/2021 Cycle 3 adjuvant Xeloda 06/07/2021 (5-day delay in start date due to foot discomfort, travel plans) CAD Interstitial  lung disease Iron deficiency anemia secondary to #1 Multiple colon polyps on the colonoscopy 02/03/2021-microscopic fragments of adenocarcinoma involving biopsies of transverse and ascending colon polyps felt to be contaminant from the cecum biopsy 2 tubular adenomas on the right colectomy specimen 03/24/2021 OSA Sick sinus syndrome-pacemaker Atrial fibrillation-maintained on apixaban Chronic diastolic CHF History of multiple skin cancers  Disposition: Mr. Wayson appears stable.  He has completed 2 cycles of adjuvant Xeloda.  He developed discomfort over the feet and skin changes over the hands and feet during cycle 2.  Symptoms are improving.  He is going to be out of town over the weekend and is uncomfortable taking Xeloda while he is away from home.  He will begin the next cycle on 06/07/2021.  We discussed the hand-foot syndrome.  He understands to discontinue Xeloda and contact the office with worsening symptoms.  He will return for lab and follow-up in 3 weeks.  We are available to see him sooner if needed.    Ned Card ANP/GNP-BC   05/31/2021  12:02 PM

## 2021-06-04 ENCOUNTER — Telehealth: Payer: Self-pay | Admitting: *Deleted

## 2021-06-04 NOTE — Telephone Encounter (Signed)
Called and spoke with patient, he states he received a new machine in the spring.  Advised I will call [employer] and have them add [place] to his account so we can get his data.  He verbalized understanding.  Nothing further needed.

## 2021-06-07 ENCOUNTER — Ambulatory Visit: Payer: PPO | Admitting: Acute Care

## 2021-06-07 ENCOUNTER — Encounter: Payer: Self-pay | Admitting: Acute Care

## 2021-06-07 ENCOUNTER — Other Ambulatory Visit: Payer: Self-pay

## 2021-06-07 VITALS — BP 112/70 | HR 63 | Temp 98.2°F | Ht 69.0 in | Wt 219.0 lb

## 2021-06-07 DIAGNOSIS — J849 Interstitial pulmonary disease, unspecified: Secondary | ICD-10-CM

## 2021-06-07 DIAGNOSIS — G4733 Obstructive sleep apnea (adult) (pediatric): Secondary | ICD-10-CM | POA: Diagnosis not present

## 2021-06-07 DIAGNOSIS — Z9989 Dependence on other enabling machines and devices: Secondary | ICD-10-CM

## 2021-06-07 NOTE — Patient Instructions (Addendum)
It is good to see you today.  Your use of your CPAP machine is excellent.  Continue on CPAP at bedtime. You appear to be benefiting from the treatment  Goal is to wear for at least 6 hours each night for maximal clinical benefit. Continue to work on weight loss, as the link between excess weight  and sleep apnea is well established.   Remember to establish a good bedtime routine, and work on sleep hygiene.  Limit daytime naps , avoid stimulants such as caffeine and nicotine close to bedtime, exercise daily to promote sleep quality, avoid heavy , spicy, fried , or rich foods before bed. Ensure adequate exposure to natural light during the day,establish a relaxing bedtime routine with a pleasant sleep environment ( Bedroom between 60 and 67 degrees, turn off bright lights , TV or device screens screens , consider black out curtains or white noise machines) Do not drive if sleepy. Remember to clean mask, tubing, filter, and reservoir once weekly with soapy water.  Please follow up with [employer] regarding the humidification issue with your new machine.  Follow up with  Dr. Elsworth Soho of [facility] NP   In 12 months  or before as needed. ( For his sleep) Please contact office for sooner follow up if symptoms do not improve or worsen or seek emergency care   Needs follow up with Dr. Vaughan Browner 11/2021 ( 6 month follow up) for ILD Please contact office for sooner follow up if symptoms do not improve or worsen or seek emergency care

## 2021-06-07 NOTE — Progress Notes (Signed)
History of Present Illness Donald [name] is a 88 y.o. male former  smoker with recurrent atrial fibrillation, OSA.   Evaluated in the ILD clinic on 05/24/2022 after high-resolution CT scan that showed pulmonary fibrosis. He is currently on Tikosyn and follows with Dr. Caryl Comes, EP. He is followed by Dr. Vaughan Browner and Dr. Chase Caller for his ILD, and Dr. Elsworth Soho for Sleep.   Pt. Is currently undergoing treatment for Colon cancer-stage III (T3N1), cecum mass biopsy 02/03/2021-adenocarcinoma 03/24/2021, robotic assisted laparoscopic right colectomy-T3, 1/8 lymph nodes, 1 tumor deposit, lymphovascular invasion, mismatch repair protein expression intact, MSS Cycle 1 adjuvant Xeloda 04/21/2021 Cycle 2 adjuvant Xeloda 05/12/2021 Cycle 3 adjuvant Xeloda 06/07/2021 (5-day delay in start date due to foot discomfort, travel plans) Chemo should be completed 08/2021.  06/07/2021 Pt. Presents for follow up of his OSA. He has been compliant with his CPAP. Down Load reveals he has been 100% compliant with usage for 30/30 days ( 05/05/2021-06/03/2021). CPAP is set at set pressure of 13 cm H20. AHI is 2.4. There are some leaks noted on Down Load, but AHI remains well controlled. Usage average is 7 hours 50 minutes.  He denies any am headaches, awakening gasping for air, or any other issues. He has his 3rd cycle of adjuvent Xeloda scheduled for today. He has had some issues with hand foot syndrome, but he is being followed by hematology/ oncology to manage this. He states over-all he is doing well. No complaints.   From an ILD perspective , he does use an albuterol inhaler as need which he states is rare. His last HRCT 02/17/21 showed no progression of disease. He is not limited by dyspnea.   Test Results: Spirometry -  FEV1 ratio of 75, with FEV1 of 77% and FVC of 77% suggesting mild restriction CT chest in 04/2013 showed mild emphysema PSG 04/2014 - 206 lbs-Moderate OSA-stopped breathing 24 times an hour.- corrected by C Pap 13  cm, large full facemask   CBC Latest Ref Rng & Units 05/31/2021 05/07/2021 04/21/2021  WBC 4.0 - 10.5 K/uL 8.9 8.3 6.9  Hemoglobin 13.0 - 17.0 g/dL 12.3(L) 11.9(L) 11.1(L)  Hematocrit 39.0 - 52.0 % 36.6(L) 36.6(L) 35.1(L)  Platelets 150 - 400 K/uL 252 236 205    BMP Latest Ref Rng & Units 05/31/2021 05/07/2021 04/21/2021  Glucose 70 - 99 mg/dL 106(H) 135(H) 96  BUN 8 - 23 mg/dL _0 Creatinine 0.61 - 1.24 mg/dL 1.00 1.02 0.95  BUN/Creat Ratio 10 - 24 - - -  Sodium 135 - 145 mmol/L 137 140 143  Potassium 3.5 - 5.1 mmol/L 4.6 4.8 4.5  Chloride 98 - 111 mmol/L 103 103 106  CO2 22 - 32 mmol/L _1 Calcium 8.9 - 10.3 mg/dL 9.3 9.4 9.9    BNP    Component Value Date/Time   BNP 286.00 01/07/2021 0000    ProBNP    Component Value Date/Time   PROBNP 265.0 (H) 06/08/2017 0927    PFT    Component Value Date/Time   FEV1PRE 2.37 05/18/2020 1545   FEV1POST 2.60 05/18/2020 1545   FVCPRE 3.26 05/18/2020 1545   FVCPOST 3.39 05/18/2020 1545   TLC 5.54 05/18/2020 1545   DLCOUNC 16.87 05/18/2020 1545   PREFEV1FVCRT 73 05/18/2020 1545   PSTFEV1FVCRT 77 05/18/2020 1545    CUP PACEART REMOTE DEVICE CHECK  Result Date: 05/18/2021 Scheduled remote reviewed. Normal device function.  1 AMS, 3.5hrs. [place]- Eliquis, on Tikosyn Next remote 91 days- JJB  Past medical hx Past Medical History:  Diagnosis Date   Abnormal CT scan, stomach 02/2014   Thickening of gastric fundus and cardia.  gastritis on EGD 03/2014   Allergic rhinitis due to pollen    Anemia 12/2020   Arthropathy, unspecified, site unspecified    Atherosclerosis    a. Noted by abdominal CT 02/2014 (h/o normal nuc 2011).   Atrial flutter ([place]) 02/24/2014   s/p ablation 11/15   Cardiomyopathy ([place])    tachy mediated - a. TEE (10/15):  EF 30%;  b. Echo after NSR restored (10/15):  mild LVH, EF 55-60%, mild AS, mild AI, mild MR, mild to mod LAE, mild RAE   CHF (congestive heart failure) ([place]) 01/04/2021   COVID-19  11/2020   Diverticulosis    severe in descending, sigmoid colon.    ED (erectile dysfunction)    Emphysema of lung ([place])    Esophageal stricture 03/2014   traversable with endoscope. not dilated.    Gastric AVM    GERD (gastroesophageal reflux disease) 03/2014   small HH and gastritis on EGD   GI bleed    Habitual alcohol use    Hemorrhoids    Hiatal hernia    Hyperlipidemia    Hypertension    Obesity    OSA on CPAP    PAF (paroxysmal atrial fibrillation) ([place])    Pneumonia 12/2020   mild with covid   Presence of permanent cardiac pacemaker    Prostatitis, unspecified    Pulmonary fibrosis (H[facility])    Sinus bradycardia    a. s/p STJ dual chamber PPM   Skin cancer    basal and squamous cell   Sleep apnea      Social History   Tobacco Use   Smoking status: Former    Packs/day: 0.00    Years: 40.00    Pack years: 0.00    Types: Cigarettes, Cigars    Quit date: 09/18/2008    Years since quitting: 12.7   Smokeless tobacco: Former    Types: Chew    Quit date: 2005   Tobacco comments:    Has Occasional Cigar/ quit   Vaping Use   Vaping Use: Never used  Substance Use Topics   Alcohol use: Not Currently    Alcohol/week: 14.0 standard drinks    Types: 14 Standard drinks or equivalent per week   Drug use: No    Mr.Sawtell reports that he quit smoking about 12 years ago. His smoking use included cigarettes and cigars. He quit smokeless tobacco use about 18 years ago.  His smokeless tobacco use included chew. He reports that he does not currently use alcohol after a past usage of about 14.0 standard drinks per week. He reports that he does not use drugs.  Tobacco Cessation: Former smoker Rare cigar  Past surgical hx, Family hx, Social hx all reviewed.  Current Outpatient Medications on File Prior to Visit  Medication Sig   acetaminophen (TYLENOL) 500 MG tablet Take 500 mg by mouth every 6 (six) hours as needed for moderate pain or headache.   albuterol (VENTOLIN HFA) 108  (90 Base) MCG/ACT inhaler Inhale TWO puffs into THE lungs every SIX hours as needed FOR wheezing OR shortness of breath   capecitabine (XELODA) 500 MG tablet Take 4 tablets by mouth in morning and 3 tablets in evening. Take with food. Take for 14 days, then hold for 7 days. Repeat every 21 days. Start cycle on 05/12/21   carboxymethylcellulose (REFRESH PLUS) 0.5 % SOLN  Place 1 drop into both eyes daily as needed (dry eyes).   diltiazem (CARDIZEM CD) 120 MG 24 hr capsule Take 1 capsule (120 mg total) by mouth 2 (two) times daily.   dofetilide (TIKOSYN) 500 MCG capsule Take 1 capsule (500 mcg total) by mouth 2 (two) times daily. Please keep upcoming appt in May 2022 with Dr. Caryl Comes before anymore refills. Thank you   ELIQUIS 5 MG TABS tablet TAKE ONE TABLET BY MOUTH EVERY MORNING and TAKE ONE TABLET BY MOUTH EVERYDAY AT BEDTIME   furosemide (LASIX) 20 MG tablet TAKE ONE TABLET BY MOUTH ONCE DAILY   isosorbide mononitrate (IMDUR) 60 MG 24 hr tablet Take 1 tablet (60 mg total) by mouth daily.   magnesium chloride (SLOW-MAG) 64 MG TBEC SR tablet Take 64 mg by mouth at bedtime.   metoprolol tartrate (LOPRESSOR) 25 MG tablet TAKE ONE TABLET BY MOUTH EVERY MORNING and TAKE ONE TABLET EVERYDAY AT BEDTIME   nitroGLYCERIN (NITROSTAT) 0.4 MG SL tablet PLACE 1 TABLET UNDER THE TONGUE EVERY 5 MINUTES AS NEEDED FOR CHEST PAIN, MAY REPEAT FOR 3 DOSES.   Pedialyte (PEDIALYTE) SOLN Take by mouth daily. 6 oz   Probiotic Product (ALIGN PO) Take 1 capsule by mouth daily.   Propylhexedrine (BENZEDREX) INHA Place 1 each into the nose daily as needed (congestion).   rosuvastatin (CRESTOR) 10 MG tablet TAKE ONE TABLET BY MOUTH ONCE DAILY   sodium chloride (OCEAN) 0.65 % SOLN nasal spray Place 1 spray into both nostrils as needed for congestion.   Wheat Dextrin (BENEFIBER) POWD Take 1 Scoop by mouth daily as needed (constipation).   ferrous sulfate 325 (65 FE) MG tablet Take 1 tablet (325 mg total) by mouth daily with  breakfast.   No current facility-administered medications on file prior to visit.     No Known Allergies  Review Of Systems:  Constitutional:   No  weight loss, night sweats,  Fevers, chills, fatigue, or  lassitude.  HEENT:   No headaches,  Difficulty swallowing,  Tooth/dental problems, or  Sore throat,                No sneezing, itching, ear ache, nasal congestion, post nasal drip,   CV:  No chest pain,  Orthopnea, PND, swelling in lower extremities, anasarca, dizziness, palpitations, syncope.   GI  No heartburn, indigestion, abdominal pain, nausea, vomiting, diarrhea, change in bowel habits, loss of appetite, bloody stools.   Resp: No shortness of breath with exertion or at rest.  No excess mucus, no productive cough,  No non-productive cough,  No coughing up of blood.  No change in color of mucus.  No wheezing.  No chest wall deformity  Skin: no rash or lesions.  GU: no dysuria, change in color of urine, no urgency or frequency.  No flank pain, no hematuria   MS:  No joint pain or swelling.  No decreased range of motion.  No back pain. Hand and foot syndrome  due to his chemo.   Psych:  No change in mood or affect. No depression or anxiety.  No memory loss.   Vital Signs BP 112/70 (BP Location: Left Arm, Patient Position: Sitting, Cuff Size: Normal)    Pulse 63    Temp 98.2 F (36.8 C) (Oral)    Ht 5' 9" (1.753 m)    Wt 219 lb (99.3 kg)    SpO2 97%    BMI 32.34 kg/m    Physical Exam:  General- No distress,  A&Ox3, pleasant  ENT: No sinus tenderness, TM clear, pale nasal mucosa, no oral exudate,no post nasal drip, no LAN Cardiac: S1, S2, regular rate and rhythm, no murmur Chest: No wheeze/ rales/ dullness; no accessory muscle use, no nasal flaring, no sternal retractions Abd.: Soft Non-tender, ND, BS +, Body mass index is 32.34 kg/m. Ext: No clubbing cyanosis, edema Neuro:  normal strength, MAE x 4, A&O x 3, appropriate Skin: No rashes, warm and dry, no lesions   Psych: normal mood and behavior   Assessment/Plan OSA on CPAP Excellent compliance  AHI 2.4 Plan  Your use of your CPAP machine is excellent.  Continue on CPAP at bedtime. You appear to be benefiting from the treatment  Goal is to wear for at least 6 hours each night for maximal clinical benefit. Continue to work on weight loss, as the link between excess weight  and sleep apnea is well established.   Remember to establish a good bedtime routine, and work on sleep hygiene.  Limit daytime naps , avoid stimulants such as caffeine and nicotine close to bedtime, exercise daily to promote sleep quality, avoid heavy , spicy, fried , or rich foods before bed. Ensure adequate exposure to natural light during the day,establish a relaxing bedtime routine with a pleasant sleep environment ( Bedroom between 60 and 67 degrees, turn off bright lights , TV or device screens screens , consider black out curtains or white noise machines) Do not drive if sleepy. Remember to clean mask, tubing, filter, and reservoir once weekly with soapy water.  Please follow up with [facility] regarding the humidification issue with your new machine.  Follow up with  Dr. Elsworth Soho of  NP   In 12 months  or before as needed. ( For his sleep) Please contact office for sooner follow up if symptoms do not improve or worsen or seek emergency care    ILD Plan Stable interval Plan Needs follow up with Dr. Vaughan Browner 11/2021 ( 6 month follow up) for ILD ( PFT's and HRCT) Please contact office for sooner follow up if symptoms do not improve or worsen or seek emergency care     06/07/2021>> BP>> WNL  I spent 40 minutes dedicated to the care of this patient on the date of this encounter to include pre-visit review of records, face-to-face time with the patient discussing conditions above, post visit ordering of testing, clinical documentation with the electronic health record, making appropriate referrals as documented, and communicating  necessary information to the patient's healthcare team.    Magdalen Spatz, NP 06/07/2021  10:13 AM

## 2021-06-09 DIAGNOSIS — G4733 Obstructive sleep apnea (adult) (pediatric): Secondary | ICD-10-CM | POA: Diagnosis not present

## 2021-06-11 ENCOUNTER — Other Ambulatory Visit: Payer: Self-pay | Admitting: Oncology

## 2021-06-11 ENCOUNTER — Other Ambulatory Visit (HOSPITAL_COMMUNITY): Payer: Self-pay

## 2021-06-11 DIAGNOSIS — C182 Malignant neoplasm of ascending colon: Secondary | ICD-10-CM

## 2021-06-11 MED ORDER — CAPECITABINE 500 MG PO TABS
ORAL_TABLET | ORAL | 0 refills | Status: DC
  Filled 2021-06-11: qty 98, 21d supply, fill #0

## 2021-06-17 ENCOUNTER — Other Ambulatory Visit (HOSPITAL_COMMUNITY): Payer: Self-pay

## 2021-06-22 ENCOUNTER — Inpatient Hospital Stay: Payer: PPO | Attending: Oncology | Admitting: Nurse Practitioner

## 2021-06-22 ENCOUNTER — Inpatient Hospital Stay: Payer: PPO

## 2021-06-22 ENCOUNTER — Other Ambulatory Visit: Payer: Self-pay

## 2021-06-22 ENCOUNTER — Encounter: Payer: Self-pay | Admitting: Internal Medicine

## 2021-06-22 ENCOUNTER — Encounter: Payer: Self-pay | Admitting: Nurse Practitioner

## 2021-06-22 VITALS — BP 127/70 | HR 66 | Temp 97.8°F | Resp 18 | Wt 217.8 lb

## 2021-06-22 DIAGNOSIS — L271 Localized skin eruption due to drugs and medicaments taken internally: Secondary | ICD-10-CM | POA: Insufficient documentation

## 2021-06-22 DIAGNOSIS — K1230 Oral mucositis (ulcerative), unspecified: Secondary | ICD-10-CM | POA: Diagnosis not present

## 2021-06-22 DIAGNOSIS — C182 Malignant neoplasm of ascending colon: Secondary | ICD-10-CM

## 2021-06-22 DIAGNOSIS — Z950 Presence of cardiac pacemaker: Secondary | ICD-10-CM | POA: Insufficient documentation

## 2021-06-22 DIAGNOSIS — C180 Malignant neoplasm of cecum: Secondary | ICD-10-CM | POA: Insufficient documentation

## 2021-06-22 LAB — CBC WITH DIFFERENTIAL (CANCER CENTER ONLY)
Abs Immature Granulocytes: 0.02 10*3/uL (ref 0.00–0.07)
Basophils Absolute: 0.1 10*3/uL (ref 0.0–0.1)
Basophils Relative: 1 %
Eosinophils Absolute: 0.5 10*3/uL (ref 0.0–0.5)
Eosinophils Relative: 5 %
HCT: 38.4 % — ABNORMAL LOW (ref 39.0–52.0)
Hemoglobin: 13 g/dL (ref 13.0–17.0)
Immature Granulocytes: 0 %
Lymphocytes Relative: 18 %
Lymphs Abs: 1.5 10*3/uL (ref 0.7–4.0)
MCH: 33.6 pg (ref 26.0–34.0)
MCHC: 33.9 g/dL (ref 30.0–36.0)
MCV: 99.2 fL (ref 80.0–100.0)
Monocytes Absolute: 0.9 10*3/uL (ref 0.1–1.0)
Monocytes Relative: 10 %
Neutro Abs: 5.7 10*3/uL (ref 1.7–7.7)
Neutrophils Relative %: 66 %
Platelet Count: 257 10*3/uL (ref 150–400)
RBC: 3.87 MIL/uL — ABNORMAL LOW (ref 4.22–5.81)
RDW: 18.5 % — ABNORMAL HIGH (ref 11.5–15.5)
WBC Count: 8.7 10*3/uL (ref 4.0–10.5)
nRBC: 0 % (ref 0.0–0.2)

## 2021-06-22 LAB — CMP (CANCER CENTER ONLY)
ALT: 12 U/L (ref 0–44)
AST: 17 U/L (ref 15–41)
Albumin: 4.1 g/dL (ref 3.5–5.0)
Alkaline Phosphatase: 58 U/L (ref 38–126)
Anion gap: 8 (ref 5–15)
BUN: 22 mg/dL (ref 8–23)
CO2: 26 mmol/L (ref 22–32)
Calcium: 9.6 mg/dL (ref 8.9–10.3)
Chloride: 103 mmol/L (ref 98–111)
Creatinine: 1.1 mg/dL (ref 0.61–1.24)
GFR, Estimated: >60 mL/min (ref >60)
Glucose, Bld: 113 mg/dL — ABNORMAL HIGH (ref 70–99)
Potassium: 4.7 mmol/L (ref 3.5–5.1)
Sodium: 137 mmol/L (ref 135–145)
Total Bilirubin: 0.6 mg/dL (ref 0.3–1.2)
Total Protein: 7 g/dL (ref 6.5–8.1)

## 2021-06-22 NOTE — Progress Notes (Signed)
Donald [name] OFFICE PROGRESS NOTE   Diagnosis: Colon cancer  INTERVAL HISTORY:   Donald [name] returns as scheduled.  He completed cycle 3 adjuvant Xeloda beginning 06/07/2021.  Hands and feet are periodically painful.  He notes peeling over the soles of his feet.  Mouth became tender.  No ulcers.  No significant diarrhea.  No nausea or vomiting.  Objective:  Vital signs in last 24 hours:  Blood pressure 127/70, pulse 66, temperature 97.8 F (36.6 C), temperature source Tympanic, resp. rate 18, weight 217 lb 12.8 oz (98.8 kg), SpO2 97 %.    HEENT: No thrush or ulcers.  Mucous membranes appear red. Resp: Lungs clear bilaterally. Cardio: Regular rate and rhythm. GI: Abdomen soft and nontender.  No hepatomegaly. Vascular: No leg edema. Skin: Palms are erythematous.  Large patch of peeling skin over bilateral lower sole.   Lab Results:  Lab Results  Component Value Date   WBC 8.7 06/22/2021   HGB 13.0 06/22/2021   HCT 38.4 (L) 06/22/2021   MCV 99.2 06/22/2021   PLT 257 06/22/2021   NEUTROABS 5.7 06/22/2021    Imaging:  No results found.  Medications: I have reviewed the patient's current medications.  Assessment/Plan: Colon cancer-stage III (T3N1), cecum mass biopsy 02/03/2021-adenocarcinoma CTs 02/16/2021-stable interstitial lung disease, mild COPD, cardiomegaly, CAD, multiple liver cysts with a few subcentimeter too small to characterize liver lesions, circumferential wall thickening at the ileocecal valve, right lower quadrant ileocolic lymph node measures 1.1 cm 03/24/2021, robotic assisted laparoscopic right colectomy-T3, 1/8 lymph nodes, 1 tumor deposit, lymphovascular invasion, mismatch repair protein expression intact, MSS Cycle 1 adjuvant Xeloda 04/21/2021 Cycle 2 adjuvant Xeloda 05/12/2021 Cycle 3 adjuvant Xeloda 06/07/2021 (5-day delay in start date due to foot discomfort, travel plans) CAD Interstitial lung disease Iron deficiency anemia secondary to  #1 Multiple colon polyps on the colonoscopy 02/03/2021-microscopic fragments of adenocarcinoma involving biopsies of transverse and ascending colon polyps felt to be contaminant from the cecum biopsy 2 tubular adenomas on the right colectomy specimen 03/24/2021 OSA Sick sinus syndrome-pacemaker Atrial fibrillation-maintained on apixaban Chronic diastolic CHF History of multiple skin cancers  Disposition: Donald [name] has completed 3 cycles of adjuvant Xeloda.  He has progressive changes of hand-foot syndrome.  Also evidence of mucositis.  He is due to begin the next cycle of Xeloda on 06/28/2021.  He understands he should not begin this cycle.  He will return for an office visit on 06/29/2021 for reevaluation.  He will likely require a dose reduction of the Xeloda.  CBC and chemistry panel reviewed.  Hemoglobin has corrected into normal range.    Ned Card ANP/GNP-BC   06/22/2021  10:29 AM

## 2021-06-29 ENCOUNTER — Encounter: Payer: Self-pay | Admitting: Nurse Practitioner

## 2021-06-29 ENCOUNTER — Other Ambulatory Visit: Payer: Self-pay

## 2021-06-29 ENCOUNTER — Inpatient Hospital Stay: Payer: PPO | Admitting: Nurse Practitioner

## 2021-06-29 VITALS — BP 122/62 | HR 77 | Temp 98.1°F | Resp 18 | Ht 69.0 in | Wt 220.6 lb

## 2021-06-29 DIAGNOSIS — C180 Malignant neoplasm of cecum: Secondary | ICD-10-CM | POA: Diagnosis not present

## 2021-06-29 DIAGNOSIS — C182 Malignant neoplasm of ascending colon: Secondary | ICD-10-CM | POA: Diagnosis not present

## 2021-06-29 NOTE — Progress Notes (Signed)
[facility] OFFICE PROGRESS NOTE   Diagnosis: Colon cancer  INTERVAL HISTORY:   Donald [name] returns as scheduled.  He has completed 3 cycles of adjuvant Xeloda.  Cycle 4 was held last week due to hand-foot syndrome, mucositis.  He reports "new skin" over the feet.  No longer having pain.  No mouth sores.  No nausea or vomiting.  No change in baseline bowel habits of 2-3 loose stools a day.  Objective:  Vital signs in last 24 hours:  Blood pressure 122/62, pulse 77, temperature 98.1 F (36.7 C), temperature source Oral, resp. rate 18, height $RemoveBe'5\' 9"'[id number]$  (1.753 m), weight 220 lb 9.6 oz (100.1 kg), SpO2 98 %.    HEENT: No thrush or ulcers. Resp: Lungs clear bilaterally. Cardio: Regular rate and rhythm. GI: Abdomen soft and nontender.  No hepatomegaly. Vascular: No leg edema. Skin: Palms and soles with mild erythema.  No skin breakdown.   Lab Results:  Lab Results  Component Value Date   WBC 8.7 06/22/2021   HGB 13.0 06/22/2021   HCT 38.4 (L) 06/22/2021   MCV 99.2 06/22/2021   PLT 257 06/22/2021   NEUTROABS 5.7 06/22/2021    Imaging:  No results found.  Medications: I have reviewed the patient's current medications.  Assessment/Plan: Colon cancer-stage III (T3N1), cecum mass biopsy 02/03/2021-adenocarcinoma CTs 02/16/2021-stable interstitial lung disease, mild COPD, cardiomegaly, CAD, multiple liver cysts with a few subcentimeter too small to characterize liver lesions, circumferential wall thickening at the ileocecal valve, right lower quadrant ileocolic lymph node measures 1.1 cm 03/24/2021, robotic assisted laparoscopic right colectomy-T3, 1/8 lymph nodes, 1 tumor deposit, lymphovascular invasion, mismatch repair protein expression intact, MSS Cycle 1 adjuvant Xeloda 04/21/2021 Cycle 2 adjuvant Xeloda 05/12/2021 Cycle 3 adjuvant Xeloda 06/07/2021 (5-day delay in start date due to foot discomfort, travel plans) Cycle 4 adjuvant Xeloda 06/29/2021, Xeloda dose  reduced to 1000 mg twice daily for 14 days CAD Interstitial lung disease Iron deficiency anemia secondary to #1 Multiple colon polyps on the colonoscopy 02/03/2021-microscopic fragments of adenocarcinoma involving biopsies of transverse and ascending colon polyps felt to be contaminant from the cecum biopsy 2 tubular adenomas on the right colectomy specimen 03/24/2021 OSA Sick sinus syndrome-pacemaker Atrial fibrillation-maintained on apixaban Chronic diastolic CHF History of multiple skin cancers  Disposition: Mr. Dirico appears stable.  He has completed 3 cycles of adjuvant Xeloda.  Cycle 4 was held last week due to hand-foot syndrome and mucositis.  Hands and feet appear improved.  No evidence of mucositis today.  He will begin cycle 4 Xeloda today at a reduced dose of 1000 mg twice daily for 14 days.  He understands to contact the office if he develops recurrent hand-foot changes or mouth sores.  He will return for lab and follow-up on 07/16/2021.  He will contact the office in the interim as outlined above or with any other problems.  Plan discussed with Dr. Benay Spice.  Ned Card ANP/GNP-BC   06/29/2021  8:17 AM

## 2021-07-01 ENCOUNTER — Other Ambulatory Visit (HOSPITAL_COMMUNITY): Payer: Self-pay

## 2021-07-14 ENCOUNTER — Other Ambulatory Visit (HOSPITAL_COMMUNITY): Payer: Self-pay

## 2021-07-15 ENCOUNTER — Encounter: Payer: Self-pay | Admitting: Oncology

## 2021-07-15 ENCOUNTER — Other Ambulatory Visit: Payer: Self-pay | Admitting: Pharmacist

## 2021-07-15 ENCOUNTER — Inpatient Hospital Stay: Payer: PPO | Admitting: Oncology

## 2021-07-15 ENCOUNTER — Other Ambulatory Visit: Payer: Self-pay

## 2021-07-15 ENCOUNTER — Other Ambulatory Visit: Payer: Self-pay | Admitting: *Deleted

## 2021-07-15 ENCOUNTER — Other Ambulatory Visit (HOSPITAL_COMMUNITY): Payer: Self-pay

## 2021-07-15 ENCOUNTER — Inpatient Hospital Stay: Payer: PPO | Attending: Oncology

## 2021-07-15 VITALS — BP 111/64 | HR 80 | Temp 97.8°F | Resp 20 | Ht 69.0 in | Wt 223.0 lb

## 2021-07-15 DIAGNOSIS — C182 Malignant neoplasm of ascending colon: Secondary | ICD-10-CM

## 2021-07-15 DIAGNOSIS — I4891 Unspecified atrial fibrillation: Secondary | ICD-10-CM | POA: Insufficient documentation

## 2021-07-15 DIAGNOSIS — Z7901 Long term (current) use of anticoagulants: Secondary | ICD-10-CM | POA: Diagnosis not present

## 2021-07-15 DIAGNOSIS — R197 Diarrhea, unspecified: Secondary | ICD-10-CM | POA: Insufficient documentation

## 2021-07-15 DIAGNOSIS — D509 Iron deficiency anemia, unspecified: Secondary | ICD-10-CM | POA: Diagnosis not present

## 2021-07-15 DIAGNOSIS — C180 Malignant neoplasm of cecum: Secondary | ICD-10-CM | POA: Diagnosis not present

## 2021-07-15 LAB — CBC WITH DIFFERENTIAL (CANCER CENTER ONLY)
Abs Immature Granulocytes: 0.02 10*3/uL (ref 0.00–0.07)
Basophils Absolute: 0 10*3/uL (ref 0.0–0.1)
Basophils Relative: 1 %
Eosinophils Absolute: 0.4 10*3/uL (ref 0.0–0.5)
Eosinophils Relative: 5 %
HCT: 40.9 % (ref 39.0–52.0)
Hemoglobin: 13.7 g/dL (ref 13.0–17.0)
Immature Granulocytes: 0 %
Lymphocytes Relative: 18 %
Lymphs Abs: 1.3 10*3/uL (ref 0.7–4.0)
MCH: 34.3 pg — ABNORMAL HIGH (ref 26.0–34.0)
MCHC: 33.5 g/dL (ref 30.0–36.0)
MCV: 102.5 fL — ABNORMAL HIGH (ref 80.0–100.0)
Monocytes Absolute: 0.7 10*3/uL (ref 0.1–1.0)
Monocytes Relative: 11 %
Neutro Abs: 4.4 10*3/uL (ref 1.7–7.7)
Neutrophils Relative %: 65 %
Platelet Count: 173 10*3/uL (ref 150–400)
RBC: 3.99 MIL/uL — ABNORMAL LOW (ref 4.22–5.81)
RDW: 18 % — ABNORMAL HIGH (ref 11.5–15.5)
WBC Count: 6.8 10*3/uL (ref 4.0–10.5)
nRBC: 0 % (ref 0.0–0.2)

## 2021-07-15 LAB — CMP (CANCER CENTER ONLY)
ALT: 14 U/L (ref 0–44)
AST: 19 U/L (ref 15–41)
Albumin: 4.1 g/dL (ref 3.5–5.0)
Alkaline Phosphatase: 63 U/L (ref 38–126)
Anion gap: 6 (ref 5–15)
BUN: 15 mg/dL (ref 8–23)
CO2: 28 mmol/L (ref 22–32)
Calcium: 9.1 mg/dL (ref 8.9–10.3)
Chloride: 107 mmol/L (ref 98–111)
Creatinine: 1.05 mg/dL (ref 0.61–1.24)
GFR, Estimated: >60 mL/min (ref >60)
Glucose, Bld: 111 mg/dL — ABNORMAL HIGH (ref 70–99)
Potassium: 4.3 mmol/L (ref 3.5–5.1)
Sodium: 141 mmol/L (ref 135–145)
Total Bilirubin: 0.7 mg/dL (ref 0.3–1.2)
Total Protein: 6.9 g/dL (ref 6.5–8.1)

## 2021-07-15 MED ORDER — CAPECITABINE 500 MG PO TABS
ORAL_TABLET | ORAL | 0 refills | Status: DC
  Filled 2021-07-15: qty 98, fill #0

## 2021-07-15 MED ORDER — CAPECITABINE 500 MG PO TABS
1000.0000 mg | ORAL_TABLET | Freq: Two times a day (BID) | ORAL | 0 refills | Status: DC
  Filled 2021-07-15: qty 56, 21d supply, fill #0

## 2021-07-15 NOTE — Progress Notes (Signed)
?  [place] ?OFFICE PROGRESS NOTE ? ? ?Diagnosis: Colon cancer ? ?INTERVAL HISTORY:  ? ?Donald [name] returns as scheduled.  He completed another cycle of Xeloda beginning 06/29/2021.  No mouth sores or hand/foot pain.  He reports diarrhea up to 3-4 times per day.  He uses fiber to help with the diarrhea.  No new complaint. ? ?Objective: ? ?Vital signs in last 24 hours: ? ?Blood pressure 111/64, pulse 80, temperature 97.8 ?F (36.6 ?C), temperature source Oral, resp. rate 20, height _0  (1.753 m), weight 223 lb (101.2 kg), SpO2 98 %. ?  ? ?HEENT: No thrush or ulcers ?Resp: Lungs clear bilaterally ?Cardio: Regular rate and rhythm ?GI: No hepatosplenomegaly ?Vascular: No leg edema  ?Skin: Mild hyperpigmentation and skin thickening at the palms and soles.  Callus formation at the soles. ? ? ?Lab Results: ? ?Lab Results  ?Component Value Date  ? WBC 6.8 07/15/2021  ? HGB 13.7 07/15/2021  ? HCT 40.9 07/15/2021  ? MCV 102.5 (H) 07/15/2021  ? PLT 173 07/15/2021  ? NEUTROABS 4.4 07/15/2021  ? ? ?CMP  ?Lab Results  ?Component Value Date  ? NA 137 06/22/2021  ? K 4.7 06/22/2021  ? CL 103 06/22/2021  ? CO2 26 06/22/2021  ? GLUCOSE 113 (H) 06/22/2021  ? BUN 22 06/22/2021  ? CREATININE 1.10 06/22/2021  ? CALCIUM 9.6 06/22/2021  ? PROT 7.0 06/22/2021  ? ALBUMIN 4.1 06/22/2021  ? AST 17 06/22/2021  ? ALT 12 06/22/2021  ? ALKPHOS 58 06/22/2021  ? BILITOT 0.6 06/22/2021  ? GFRNONAA >60 06/22/2021  ? GFRAA >60 02/25/2019  ? ? ?Lab Results  ?Component Value Date  ? CEA 1.37 04/21/2021  ? ? ?Medications: I have reviewed the patient's current medications. ? ? ?Assessment/Plan: ?Colon cancer-stage III (T3N1), cecum mass biopsy 02/03/2021-adenocarcinoma ?CTs 02/16/2021-stable interstitial lung disease, mild COPD, cardiomegaly, CAD, multiple liver cysts with a few subcentimeter too small to characterize liver lesions, circumferential wall thickening at the ileocecal valve, right lower quadrant ileocolic lymph node measures 1.1  cm ?03/24/2021, robotic assisted laparoscopic right colectomy-T3, 1/8 lymph nodes, 1 tumor deposit, lymphovascular invasion, mismatch repair protein expression intact, MSS ?Cycle 1 adjuvant Xeloda 04/21/2021 ?Cycle 2 adjuvant Xeloda 05/12/2021 ?Cycle 3 adjuvant Xeloda 06/07/2021 (5-day delay in start date due to foot discomfort, travel plans) ?Cycle 4 adjuvant Xeloda 06/29/2021, Xeloda dose reduced to 1000 mg twice daily for 14 days ?Cycle 5 adjuvant Xeloda 07/19/2021 ?CAD ?Interstitial lung disease ?Iron deficiency anemia secondary to #1 ?Multiple colon polyps on the colonoscopy 02/03/2021-microscopic fragments of adenocarcinoma involving biopsies of transverse and ascending colon polyps felt to be contaminant from the cecum biopsy ?2 tubular adenomas on the right colectomy specimen 03/24/2021 ?OSA ?Sick sinus syndrome-pacemaker ?Atrial fibrillation-maintained on apixaban ?Chronic diastolic CHF ?History of multiple skin cancers ? ? ? ?Disposition: ?Donald [name] tolerated the last cycle of Xeloda well.  He appears stable.  He will complete cycle 5 adjuvant Xeloda beginning 07/19/2021.  He will return for an office and lab visit in 3 weeks. ? ?Betsy Coder, MD ? ?07/15/2021  ?8:00 AM ? ? ?

## 2021-07-21 ENCOUNTER — Other Ambulatory Visit (HOSPITAL_COMMUNITY): Payer: Self-pay

## 2021-07-23 ENCOUNTER — Other Ambulatory Visit: Payer: Self-pay | Admitting: Adult Health

## 2021-07-23 MED ORDER — ALBUTEROL SULFATE HFA 108 (90 BASE) MCG/ACT IN AERS: INHALATION_SPRAY | RESPIRATORY_TRACT | 3 refills | Status: DC

## 2021-07-23 NOTE — Telephone Encounter (Signed)
Message routed to Christina Wert, NP.  

## 2021-07-26 ENCOUNTER — Encounter: Payer: Self-pay | Admitting: Oncology

## 2021-07-26 ENCOUNTER — Encounter: Payer: Self-pay | Admitting: Internal Medicine

## 2021-07-26 DIAGNOSIS — L570 Actinic keratosis: Secondary | ICD-10-CM | POA: Diagnosis not present

## 2021-07-26 DIAGNOSIS — L821 Other seborrheic keratosis: Secondary | ICD-10-CM | POA: Diagnosis not present

## 2021-07-26 DIAGNOSIS — Z85828 Personal history of other malignant neoplasm of skin: Secondary | ICD-10-CM | POA: Diagnosis not present

## 2021-07-26 DIAGNOSIS — C44319 Basal cell carcinoma of skin of other parts of face: Secondary | ICD-10-CM | POA: Diagnosis not present

## 2021-07-26 DIAGNOSIS — D225 Melanocytic nevi of trunk: Secondary | ICD-10-CM | POA: Diagnosis not present

## 2021-08-06 ENCOUNTER — Inpatient Hospital Stay: Payer: PPO

## 2021-08-06 ENCOUNTER — Inpatient Hospital Stay: Payer: PPO | Admitting: Nurse Practitioner

## 2021-08-06 ENCOUNTER — Encounter: Payer: Self-pay | Admitting: Nurse Practitioner

## 2021-08-06 ENCOUNTER — Other Ambulatory Visit: Payer: Self-pay

## 2021-08-06 ENCOUNTER — Other Ambulatory Visit (HOSPITAL_COMMUNITY): Payer: Self-pay

## 2021-08-06 VITALS — BP 115/69 | HR 83 | Temp 97.8°F | Resp 18 | Ht 69.0 in | Wt 227.0 lb

## 2021-08-06 DIAGNOSIS — C182 Malignant neoplasm of ascending colon: Secondary | ICD-10-CM

## 2021-08-06 DIAGNOSIS — C180 Malignant neoplasm of cecum: Secondary | ICD-10-CM | POA: Diagnosis not present

## 2021-08-06 LAB — CBC WITH DIFFERENTIAL (CANCER CENTER ONLY)
Abs Immature Granulocytes: 0.02 10*3/uL (ref 0.00–0.07)
Basophils Absolute: 0 10*3/uL (ref 0.0–0.1)
Basophils Relative: 1 %
Eosinophils Absolute: 0.2 10*3/uL (ref 0.0–0.5)
Eosinophils Relative: 3 %
HCT: 40.1 % (ref 39.0–52.0)
Hemoglobin: 13.7 g/dL (ref 13.0–17.0)
Immature Granulocytes: 0 %
Lymphocytes Relative: 20 %
Lymphs Abs: 1.7 10*3/uL (ref 0.7–4.0)
MCH: 34.8 pg — ABNORMAL HIGH (ref 26.0–34.0)
MCHC: 34.2 g/dL (ref 30.0–36.0)
MCV: 101.8 fL — ABNORMAL HIGH (ref 80.0–100.0)
Monocytes Absolute: 0.8 10*3/uL (ref 0.1–1.0)
Monocytes Relative: 9 %
Neutro Abs: 5.8 10*3/uL (ref 1.7–7.7)
Neutrophils Relative %: 67 %
Platelet Count: 188 10*3/uL (ref 150–400)
RBC: 3.94 MIL/uL — ABNORMAL LOW (ref 4.22–5.81)
RDW: 17.1 % — ABNORMAL HIGH (ref 11.5–15.5)
WBC Count: 8.5 10*3/uL (ref 4.0–10.5)
nRBC: 0 % (ref 0.0–0.2)

## 2021-08-06 LAB — CMP (CANCER CENTER ONLY)
ALT: 14 U/L (ref 0–44)
AST: 17 U/L (ref 15–41)
Albumin: 4.1 g/dL (ref 3.5–5.0)
Alkaline Phosphatase: 67 U/L (ref 38–126)
Anion gap: 6 (ref 5–15)
BUN: 17 mg/dL (ref 8–23)
CO2: 27 mmol/L (ref 22–32)
Calcium: 9.5 mg/dL (ref 8.9–10.3)
Chloride: 105 mmol/L (ref 98–111)
Creatinine: 1 mg/dL (ref 0.61–1.24)
GFR, Estimated: >60 mL/min (ref >60)
Glucose, Bld: 105 mg/dL — ABNORMAL HIGH (ref 70–99)
Potassium: 4.5 mmol/L (ref 3.5–5.1)
Sodium: 138 mmol/L (ref 135–145)
Total Bilirubin: 0.7 mg/dL (ref 0.3–1.2)
Total Protein: 7.1 g/dL (ref 6.5–8.1)

## 2021-08-06 MED ORDER — CAPECITABINE 500 MG PO TABS
1000.0000 mg | ORAL_TABLET | Freq: Two times a day (BID) | ORAL | 0 refills | Status: DC
  Filled 2021-08-06 – 2021-08-10 (×2): qty 14, 4d supply, fill #0

## 2021-08-06 NOTE — Progress Notes (Signed)
?  [place] ?OFFICE PROGRESS NOTE ? ? ?Diagnosis: Colon cancer ? ?INTERVAL HISTORY:  ? ?Donald [name] returns as scheduled.  He completed cycle 5 adjuvant Xeloda beginning 07/19/2021.  He denies nausea/vomiting.  No mouth sores.  He has intermittent diarrhea, controlled with Imodium.  No peeling, cracks or significant pain on the hands or feet. ? ?Objective: ? ?Vital signs in last 24 hours: ? ?Blood pressure 115/69, pulse 83, temperature 97.8 ?F (36.6 ?C), temperature source Oral, resp. rate 18, height $RemoveBe'5\' 9"'[id number]$  (1.753 m), weight 227 lb (103 kg), SpO2 97 %. ?  ? ?HEENT: No thrush or ulcers. ?Resp: Lungs clear bilaterally. ?Cardio: Regular rate and rhythm. ?GI: Abdomen soft.  Mild tenderness at the upper abdomen.  No hepatomegaly. ?Vascular: No leg edema. ?Skin: Palms and soles with mild erythema.  No skin breakdown. ? ? ?Lab Results: ? ?Lab Results  ?Component Value Date  ? WBC 8.5 08/06/2021  ? HGB 13.7 08/06/2021  ? HCT 40.1 08/06/2021  ? MCV 101.8 (H) 08/06/2021  ? PLT 188 08/06/2021  ? NEUTROABS 5.8 08/06/2021  ? ? ?Imaging: ? ?No results found. ? ?Medications: I have reviewed the patient's current medications. ? ?Assessment/Plan: ?Colon cancer-stage III (T3N1), cecum mass biopsy 02/03/2021-adenocarcinoma ?CTs 02/16/2021-stable interstitial lung disease, mild COPD, cardiomegaly, CAD, multiple liver cysts with a few subcentimeter too small to characterize liver lesions, circumferential wall thickening at the ileocecal valve, right lower quadrant ileocolic lymph node measures 1.1 cm ?03/24/2021, robotic assisted laparoscopic right colectomy-T3, 1/8 lymph nodes, 1 tumor deposit, lymphovascular invasion, mismatch repair protein expression intact, MSS ?Cycle 1 adjuvant Xeloda 04/21/2021 ?Cycle 2 adjuvant Xeloda 05/12/2021 ?Cycle 3 adjuvant Xeloda 06/07/2021 (5-day delay in start date due to foot discomfort, travel plans) ?Cycle 4 adjuvant Xeloda 06/29/2021, Xeloda dose reduced to 1000 mg twice daily for 14 days ?Cycle  5 adjuvant Xeloda 07/19/2021 ?CAD ?Interstitial lung disease ?Iron deficiency anemia secondary to #1 ?Multiple colon polyps on the colonoscopy 02/03/2021-microscopic fragments of adenocarcinoma involving biopsies of transverse and ascending colon polyps felt to be contaminant from the cecum biopsy ?2 tubular adenomas on the right colectomy specimen 03/24/2021 ?OSA ?Sick sinus syndrome-pacemaker ?Atrial fibrillation-maintained on apixaban ?Chronic diastolic CHF ?History of multiple skin cancers ?  ? ?Disposition: Donald [name] appears stable.  He has completed 5 cycles of adjuvant Xeloda.  Overall he is tolerating well.  Plan to proceed with cycle 6 as scheduled beginning 08/09/2021. ? ?CBC from today reviewed.  Counts adequate to proceed with Xeloda as above. ? ?He will return for lab and follow-up in 3 weeks.  We are available to see him sooner if needed. ? ? ? ?Ned Card ANP/GNP-BC  ? ?08/06/2021  ?9:48 AM ? ? ? ? ? ? ? ?

## 2021-08-10 ENCOUNTER — Other Ambulatory Visit (HOSPITAL_COMMUNITY): Payer: Self-pay

## 2021-08-13 ENCOUNTER — Encounter: Payer: Self-pay | Admitting: *Deleted

## 2021-08-13 ENCOUNTER — Ambulatory Visit: Payer: PPO | Admitting: Internal Medicine

## 2021-08-13 ENCOUNTER — Encounter: Payer: Self-pay | Admitting: Internal Medicine

## 2021-08-13 VITALS — BP 98/66 | HR 90 | Ht 69.0 in | Wt 228.0 lb

## 2021-08-13 DIAGNOSIS — Z950 Presence of cardiac pacemaker: Secondary | ICD-10-CM

## 2021-08-13 DIAGNOSIS — I4819 Other persistent atrial fibrillation: Secondary | ICD-10-CM | POA: Diagnosis not present

## 2021-08-13 DIAGNOSIS — I5032 Chronic diastolic (congestive) heart failure: Secondary | ICD-10-CM

## 2021-08-13 DIAGNOSIS — I495 Sick sinus syndrome: Secondary | ICD-10-CM

## 2021-08-13 MED ORDER — MAGNESIUM OXIDE 400 MG PO CAPS: 400.0000 mg | ORAL_CAPSULE | Freq: Every day | ORAL | 6 refills | Status: DC

## 2021-08-13 NOTE — Patient Instructions (Addendum)
Medication Instructions:  ?Your physician has recommended you make the following change in your medication:  ?STOP Magnesium Chloride ?START Magnesium Oxide 400 mg once daily ? ?*If you need a refill on your cardiac medications before your next appointment, please call your pharmacy* ? ? ?Lab Work: ?None ordered ? ? ?Testing/Procedures: ?Your physician has recommended that you have a Cardioversion (DCCV). Electrical Cardioversion uses a jolt of electricity to your heart either through paddles or wired patches attached to your chest. This is a controlled, usually prescheduled, procedure. Defibrillation is done under light anesthesia in the hospital, and you usually go home the day of the procedure. This is done to get your heart back into a normal rhythm. You are not awake for the procedure. Please see the instruction sheet given to you today. ? ? ?Follow-Up: ?At [facility], you and your health needs are our priority.  As part of our continuing mission to provide you with exceptional heart care, we have created designated Provider Care Teams.  These Care Teams include your primary Cardiologist (physician) and Advanced Practice Providers (APPs -  Physician Assistants and Nurse Practitioners) who all work together to provide you with the care you need, when you need it. ? ?Remote monitoring is used to monitor your Pacemaker or ICD from home. This monitoring reduces the number of office visits required to check your device to one time per year. It allows [place] to keep an eye on the functioning of your device to ensure it is working properly. You are scheduled for a device check from home on 08/25/2021. You may send your transmission at any time that day. If you have a wireless device, the transmission will be sent automatically. After your physician reviews your transmission, you will receive a postcard with your next transmission date. ? ?Your next appointment:   ?2 month(s) after your cardioversion ? ?The format for  your next appointment:   ?In Person ? ?Provider:   ?You will follow up in the [facility] located at [facility]. ?Your provider will be: ?Roderic Palau, NP or Clint R. Fenton, PA-C ? ? ?Thank you for choosing [facility]!! ? ? ?[phone] ?

## 2021-08-13 NOTE — Progress Notes (Signed)
? ? ? ? ?Patient Care Team: ?Virgie Dad, MD as PCP - General (Internal Medicine) ?Deboraha Sprang, MD as PCP - Cardiology (Cardiology) ?Deboraha Sprang, MD as PCP - Electrophysiology (Cardiology) ?Franchot Gallo, MD (Urology) ?[name], [name], MD (Dermatology) ?Monna Fam, MD as Consulting Physician (Ophthalmology) ?Deboraha Sprang, MD as Consulting Physician (Cardiology) ?Pyrtle, Lajuan Lines, MD as Consulting Physician (Gastroenterology) ?Brand Males, MD as Consulting Physician (Pulmonary Disease) ?Foltanski, Cleaster Corin, [facility] as Pharmacist (Pharmacist) ? ? ?HPI ? ?Donald [name] is a 88 y.o. 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 ([employer]).  Anticoagulated with apixaban    Rhythm control with dofetilide since 1/20 ? ?Presents today with overall awareness of his heart beating fast and not feeling quite right.  Consistent with the interrogation demonstrating atrial arrhythmias for the last week.  No chest pain.  Minimal change in his respiratory status.  No nocturnal dyspnea orthopnea.  No syncope or lightheadedness. ? ?He is status post surgery for stage III colon cancer.  On chemotherapy. ? ?He was started in 2017 on nitroglycerin because of dyspnea on exertion with a mildly abnormal Myoview for which was elected not to pursue more definitive diagnosis.  Catheterization is also demonstrated moderate nonobstructive disease.  He remains on a statin of the other things he needs a prescription for mag oxide 4 ?  ?DATE TEST EF   ?10/15    Echo  55 %  mild AS ( mean gradient 10) --? Bicuspid aortic valve  ?9/16 myoview 60-65%   mild ischemia  ?10/17 Echo 60-65%  No comment on bicuspid valve//mild - Mod  AI   ?11/18 Cath 55-65% Mod non obstructive CAD   ?1/20 Echo 60-65% LAE (52/2.5/45)  ?10/22 Echo   60-65% BAE  ? ? ? Date Cr K Mg Hgb TSH  ?10/17 0.97  4.6  13.2   0.56  ?4/18 1.07 5.2  13.9   ?1/19 1.26 5.1  14 0.46  ?1/20 1.04 4.7 2.1  13.6   ?10/20 0.97 4.8 2.0 14.0 0.68  ?3/23 1.00 4.5  13.7 1.9 (9/22)  ?  ? ?thromboembolic risk factors ( age  -2, HTN-1, ) for a CHADSVASc Score of 3 ? ?  ?  ? ?Past Medical History:  ?Diagnosis Date  ? Abnormal CT scan, stomach 02/2014  ? Thickening of gastric fundus and cardia.  gastritis on EGD 03/2014  ? Allergic rhinitis due to pollen   ? Anemia 12/2020  ? Arthropathy, unspecified, site unspecified   ? Atherosclerosis   ? a. Noted by abdominal CT 02/2014 (h/o normal nuc 2011).  ? Atrial flutter ([place]) 02/24/2014  ? s/p ablation 11/15  ? Cardiomyopathy ([place])   ? tachy mediated - a. TEE (10/15):  EF 30%;  b. Echo after NSR restored (10/15):  mild LVH, EF 55-60%, mild AS, mild AI, mild MR, mild to mod LAE, mild RAE  ? CHF (congestive heart failure) ([place]) 01/04/2021  ? COVID-19 11/2020  ? Diverticulosis   ? severe in descending, sigmoid colon.   ? ED (erectile dysfunction)   ? Emphysema of lung ([place])   ? Esophageal stricture 03/2014  ? traversable with endoscope. not dilated.   ? Gastric AVM   ? GERD (gastroesophageal reflux disease) 03/2014  ? small HH and gastritis on EGD  ? GI bleed   ? Habitual alcohol use   ? Hemorrhoids   ? Hiatal hernia   ? Hyperlipidemia   ?  Hypertension   ? Obesity   ? OSA on CPAP   ? PAF (paroxysmal atrial fibrillation) ([place])   ? Pneumonia 12/2020  ? mild with covid  ? Presence of permanent cardiac pacemaker   ? Prostatitis, unspecified   ? Pulmonary fibrosis ([place])   ? Sinus bradycardia   ? a. s/p STJ dual chamber PPM  ? Skin cancer   ? basal and squamous cell  ? Sleep apnea   ? ? ?Past Surgical History:  ?Procedure Laterality Date  ? ATRIAL FLUTTER ABLATION N/A 03/19/2014  ? RFCA of atrial flutter by Dr Caryl Comes  ? CARDIOVERSION N/A 02/24/2014  ? Procedure: CARDIOVERSION;  Surgeon: Lelon Perla, MD;  Location: [place];  Service: Cardiovascular;  Laterality: N/A;  ? CARDIOVERSION Right 09/10/2014  ? Procedure: CARDIOVERSION;  Surgeon: Deboraha Sprang, MD;  Location: [facility] CATH LAB;   Service: Cardiovascular;  Laterality: Right;  ? CARDIOVERSION N/A 01/22/2018  ? Procedure: CARDIOVERSION;  Surgeon: Fay Records, MD;  Location: [place];  Service: Cardiovascular;  Laterality: N/A;  ? COLONOSCOPY N/A 04/23/2015  ? Procedure: COLONOSCOPY;  Surgeon: Ladene Artist, MD;  Location: [facility] ENDOSCOPY;  Service: Endoscopy;  Laterality: N/A;  ? COLONOSCOPY    ? ENTEROSCOPY N/A 06/12/2015  ? Procedure: ENTEROSCOPY;  Surgeon: Ladene Artist, MD;  Location: [facility] ENDOSCOPY;  Service: Endoscopy;  Laterality: N/A;  ? EP IMPLANTABLE DEVICE N/A 02/15/2016  ? Procedure: Pacemaker Implant;  Surgeon: Evans Lance, MD;  Location: [place] CV LAB;  Service: Cardiovascular;  Laterality: N/A;  ? ESOPHAGOGASTRODUODENOSCOPY N/A 04/09/2014  ? Procedure: ESOPHAGOGASTRODUODENOSCOPY (EGD);  Surgeon: Inda Castle, MD;  Location: [place];  Service: Endoscopy;  Laterality: N/A;  ? ESOPHAGOGASTRODUODENOSCOPY N/A 04/23/2015  ? Procedure: ESOPHAGOGASTRODUODENOSCOPY (EGD);  Surgeon: Ladene Artist, MD;  Location: [facility] ENDOSCOPY;  Service: Endoscopy;  Laterality: N/A;  ? INGUINAL HERNIA REPAIR    ? right  ? INGUINAL HERNIA REPAIR    ? left  ? LEFT HEART CATH AND CORONARY ANGIOGRAPHY N/A 03/16/2017  ? Procedure: LEFT HEART CATH AND CORONARY ANGIOGRAPHY;  Surgeon: Burnell Blanks, MD;  Location: [place] CV LAB;  Service: Cardiovascular;  Laterality: N/A;  ? ORIF fracture of the elbow  1992  ? SKIN CANCER EXCISION  05/21/2012  ? Squamous cell ca  ? TEE WITHOUT CARDIOVERSION N/A 02/24/2014  ? Procedure: TRANSESOPHAGEAL ECHOCARDIOGRAM (TEE);  Surgeon: Lelon Perla, MD;  Location: [facility] ENDOSCOPY;  Service: Cardiovascular;  Laterality: N/A;  ? TONSILLECTOMY    ? UPPER GASTROINTESTINAL ENDOSCOPY    ? ? ?Current Outpatient Medications  ?Medication Sig Dispense Refill  ? acetaminophen (TYLENOL) 500 MG tablet Take 500 mg by mouth every 6 (six) hours as needed for moderate pain or headache.    ? albuterol (VENTOLIN HFA)  108 (90 Base) MCG/ACT inhaler Inhale TWO puffs into THE lungs every SIX hours as needed FOR wheezing OR shortness of breath 8.5 g 3  ? capecitabine (XELODA) 500 MG tablet Take 2 tablets (1,000 mg total) by mouth 2 (two) times daily after a meal. Take for 14 days, then off 7 days. Repeat every 21 days. Start this cycle on 08/09/21 14 tablet 0  ? carboxymethylcellulose (REFRESH PLUS) 0.5 % SOLN Place 1 drop into both eyes daily as needed (dry eyes).    ? diltiazem (CARDIZEM CD) 120 MG 24 hr capsule Take 1 capsule (120 mg total) by mouth 2 (two) times daily. 180 capsule 3  ? dofetilide (TIKOSYN) 500 MCG capsule Take 1  capsule (500 mcg total) by mouth 2 (two) times daily. Please keep upcoming appt in May 2022 with Dr. Caryl Comes before anymore refills. Thank you 60 capsule 11  ? ELIQUIS 5 MG TABS tablet TAKE ONE TABLET BY MOUTH EVERY MORNING and TAKE ONE TABLET BY MOUTH EVERYDAY AT BEDTIME 60 tablet 6  ? furosemide (LASIX) 20 MG tablet TAKE ONE TABLET BY MOUTH ONCE DAILY 90 tablet 3  ? isosorbide mononitrate (IMDUR) 60 MG 24 hr tablet Take 1 tablet (60 mg total) by mouth daily. 90 tablet 3  ? magnesium chloride (SLOW-MAG) 64 MG TBEC SR tablet Take 64 mg by mouth at bedtime.    ? metoprolol tartrate (LOPRESSOR) 25 MG tablet TAKE ONE TABLET BY MOUTH EVERY MORNING and TAKE ONE TABLET EVERYDAY AT BEDTIME 60 tablet 5  ? nitroGLYCERIN (NITROSTAT) 0.4 MG SL tablet PLACE 1 TABLET UNDER THE TONGUE EVERY 5 MINUTES AS NEEDED FOR CHEST PAIN, MAY REPEAT FOR 3 DOSES. 12 tablet 0  ? Pedialyte (PEDIALYTE) SOLN Take by mouth daily. 6 oz    ? Propylhexedrine (BENZEDREX) INHA Place 1 each into the nose daily as needed (congestion).    ? rosuvastatin (CRESTOR) 10 MG tablet TAKE ONE TABLET BY MOUTH ONCE DAILY 90 tablet 3  ? sodium chloride (OCEAN) 0.65 % SOLN nasal spray Place 1 spray into both nostrils as needed for congestion.    ? Wheat Dextrin (BENEFIBER) POWD Take 1 Scoop by mouth daily as needed (constipation).    ? ?No current  facility-administered medications for this visit.  ? ? ?No Known Allergies ? ?Review of Systems negative except from HPI and PMH ? ?Physical Exam ?BP 98/66   Pulse 90   Ht '5\' 9"'$  (1.753 m)   Wt 228 lb (103.4 kg)

## 2021-08-16 ENCOUNTER — Encounter: Payer: Self-pay | Admitting: Internal Medicine

## 2021-08-17 ENCOUNTER — Telehealth: Payer: Self-pay

## 2021-08-17 ENCOUNTER — Ambulatory Visit (INDEPENDENT_AMBULATORY_CARE_PROVIDER_SITE_OTHER): Payer: PPO

## 2021-08-17 DIAGNOSIS — I495 Sick sinus syndrome: Secondary | ICD-10-CM | POA: Diagnosis not present

## 2021-08-17 NOTE — Telephone Encounter (Signed)
Remote check presenting rhythm reviewed and discussed with Dr. Caryl Comes. ? ?Per review by Dr. Mariane Duval presenting rhythm is NSR.  Will cancel DCCV. ? ?[facility] appointment.  Kept follow up with Dr. Caryl Comes in May. ? ?Pt thanked nurse for call back. ? ? ?

## 2021-08-18 LAB — CUP PACEART REMOTE DEVICE CHECK
Battery Remaining Longevity: 52 mo
Battery Remaining Percentage: 46 %
Battery Voltage: 2.98 V
Brady Statistic AP VP Percent: 1 %
Brady Statistic AP VS Percent: 98 %
Brady Statistic AS VP Percent: 1 %
Brady Statistic AS VS Percent: 1.3 %
Brady Statistic RA Percent Paced: 94 %
Brady Statistic RV Percent Paced: 1 %
Date Time Interrogation Session: 20230404020014
Implantable Lead Implant Date: 20171002
Implantable Lead Implant Date: 20171002
Implantable Lead Location: 753859
Implantable Lead Location: 753860
Implantable Pulse Generator Implant Date: 20171002
Lead Channel Impedance Value: 390 Ohm
Lead Channel Impedance Value: 450 Ohm
Lead Channel Pacing Threshold Amplitude: 0.5 V
Lead Channel Pacing Threshold Amplitude: 0.75 V
Lead Channel Pacing Threshold Pulse Width: 0.5 ms
Lead Channel Pacing Threshold Pulse Width: 0.5 ms
Lead Channel Sensing Intrinsic Amplitude: 12 mV
Lead Channel Sensing Intrinsic Amplitude: 3.5 mV
Lead Channel Setting Pacing Amplitude: 1.5 V
Lead Channel Setting Pacing Amplitude: 2.5 V
Lead Channel Setting Pacing Pulse Width: 0.5 ms
Lead Channel Setting Sensing Sensitivity: 2 mV
Pulse Gen Model: 2272
Pulse Gen Serial Number: 7951215

## 2021-08-19 ENCOUNTER — Other Ambulatory Visit (HOSPITAL_COMMUNITY): Payer: Self-pay

## 2021-08-19 ENCOUNTER — Other Ambulatory Visit: Payer: Self-pay | Admitting: Oncology

## 2021-08-19 DIAGNOSIS — C44319 Basal cell carcinoma of skin of other parts of face: Secondary | ICD-10-CM | POA: Diagnosis not present

## 2021-08-19 DIAGNOSIS — C182 Malignant neoplasm of ascending colon: Secondary | ICD-10-CM

## 2021-08-19 DIAGNOSIS — Z85828 Personal history of other malignant neoplasm of skin: Secondary | ICD-10-CM | POA: Diagnosis not present

## 2021-08-19 MED FILL — Capecitabine Tab 500 MG: ORAL | 21 days supply | Qty: 56 | Fill #0 | Status: AC

## 2021-08-20 ENCOUNTER — Other Ambulatory Visit (HOSPITAL_COMMUNITY): Payer: Self-pay

## 2021-08-21 ENCOUNTER — Other Ambulatory Visit: Payer: Self-pay | Admitting: Internal Medicine

## 2021-08-25 ENCOUNTER — Other Ambulatory Visit (HOSPITAL_COMMUNITY): Payer: Self-pay

## 2021-08-27 ENCOUNTER — Ambulatory Visit (HOSPITAL_COMMUNITY): Admit: 2021-08-27 | Payer: PPO | Admitting: Cardiology

## 2021-08-27 ENCOUNTER — Encounter (HOSPITAL_COMMUNITY): Payer: Self-pay

## 2021-08-27 DIAGNOSIS — I4819 Other persistent atrial fibrillation: Secondary | ICD-10-CM

## 2021-08-27 SURGERY — CARDIOVERSION
Anesthesia: Monitor Anesthesia Care

## 2021-08-30 ENCOUNTER — Inpatient Hospital Stay: Payer: PPO | Admitting: Oncology

## 2021-08-30 ENCOUNTER — Inpatient Hospital Stay: Payer: PPO | Attending: Oncology

## 2021-08-30 VITALS — BP 127/66 | HR 76 | Temp 98.1°F | Resp 20 | Ht 69.0 in | Wt 221.6 lb

## 2021-08-30 DIAGNOSIS — R195 Other fecal abnormalities: Secondary | ICD-10-CM | POA: Insufficient documentation

## 2021-08-30 DIAGNOSIS — C180 Malignant neoplasm of cecum: Secondary | ICD-10-CM | POA: Diagnosis not present

## 2021-08-30 DIAGNOSIS — C182 Malignant neoplasm of ascending colon: Secondary | ICD-10-CM

## 2021-08-30 LAB — CBC WITH DIFFERENTIAL (CANCER CENTER ONLY)
Abs Immature Granulocytes: 0.02 10*3/uL (ref 0.00–0.07)
Basophils Absolute: 0 10*3/uL (ref 0.0–0.1)
Basophils Relative: 1 %
Eosinophils Absolute: 0.3 10*3/uL (ref 0.0–0.5)
Eosinophils Relative: 4 %
HCT: 42.4 % (ref 39.0–52.0)
Hemoglobin: 14.5 g/dL (ref 13.0–17.0)
Immature Granulocytes: 0 %
Lymphocytes Relative: 16 %
Lymphs Abs: 1.3 10*3/uL (ref 0.7–4.0)
MCH: 35.5 pg — ABNORMAL HIGH (ref 26.0–34.0)
MCHC: 34.2 g/dL (ref 30.0–36.0)
MCV: 103.7 fL — ABNORMAL HIGH (ref 80.0–100.0)
Monocytes Absolute: 0.6 10*3/uL (ref 0.1–1.0)
Monocytes Relative: 7 %
Neutro Abs: 5.8 10*3/uL (ref 1.7–7.7)
Neutrophils Relative %: 72 %
Platelet Count: 188 10*3/uL (ref 150–400)
RBC: 4.09 MIL/uL — ABNORMAL LOW (ref 4.22–5.81)
RDW: 15.6 % — ABNORMAL HIGH (ref 11.5–15.5)
WBC Count: 8 10*3/uL (ref 4.0–10.5)
nRBC: 0 % (ref 0.0–0.2)

## 2021-08-30 LAB — CMP (CANCER CENTER ONLY)
ALT: 17 U/L (ref 0–44)
AST: 21 U/L (ref 15–41)
Albumin: 4.3 g/dL (ref 3.5–5.0)
Alkaline Phosphatase: 71 U/L (ref 38–126)
Anion gap: 7 (ref 5–15)
BUN: 16 mg/dL (ref 8–23)
CO2: 28 mmol/L (ref 22–32)
Calcium: 9.5 mg/dL (ref 8.9–10.3)
Chloride: 104 mmol/L (ref 98–111)
Creatinine: 1.03 mg/dL (ref 0.61–1.24)
GFR, Estimated: >60 mL/min (ref >60)
Glucose, Bld: 127 mg/dL — ABNORMAL HIGH (ref 70–99)
Potassium: 4.7 mmol/L (ref 3.5–5.1)
Sodium: 139 mmol/L (ref 135–145)
Total Bilirubin: 1.1 mg/dL (ref 0.3–1.2)
Total Protein: 7.4 g/dL (ref 6.5–8.1)

## 2021-08-30 NOTE — Progress Notes (Signed)
?  Donald ?OFFICE PROGRESS NOTE ? ? ?Diagnosis: Colon cancer ? ?INTERVAL HISTORY:  ? ?Donald [name] completed another cycle Xeloda beginning 08/09/2021.  No mouth sores or hand/foot pain.  He continues to have 3-4 loose stools per day.  Imodium helps.  He had an episode of atrial fibrillation last month.  He saw Dr. Caryl Comes 08/13/2021. ? ?Objective: ? ?Vital signs in last 24 hours: ? ?Blood pressure 127/66, pulse 76, temperature 98.1 ?F (36.7 ?C), temperature source Oral, resp. rate 20, height $RemoveBe'5\' 9"'POqnPexyZ$  (1.753 m), weight 221 lb 9.6 oz (100.5 kg), SpO2 99 %. ?  ? ?HEENT: No thrush or ulcers ?Resp: Lungs clear bilaterally ?Cardio: Regular rate and rhythm ?GI: No hepatosplenomegaly ?Vascular: No leg edema  ?Skin: Skin thickening and dryness at the palms and soles.  Mild erythema at the palms.  No skin breakdown. ? ? ?Lab Results: ? ?Lab Results  ?Component Value Date  ? WBC 8.0 08/30/2021  ? HGB 14.5 08/30/2021  ? HCT 42.4 08/30/2021  ? MCV 103.7 (H) 08/30/2021  ? PLT 188 08/30/2021  ? NEUTROABS 5.8 08/30/2021  ? ? ?CMP  ?Lab Results  ?Component Value Date  ? NA 138 08/06/2021  ? K 4.5 08/06/2021  ? CL 105 08/06/2021  ? CO2 27 08/06/2021  ? GLUCOSE 105 (H) 08/06/2021  ? BUN 17 08/06/2021  ? CREATININE 1.00 08/06/2021  ? CALCIUM 9.5 08/06/2021  ? PROT 7.1 08/06/2021  ? ALBUMIN 4.1 08/06/2021  ? AST 17 08/06/2021  ? ALT 14 08/06/2021  ? ALKPHOS 67 08/06/2021  ? BILITOT 0.7 08/06/2021  ? GFRNONAA >60 08/06/2021  ? GFRAA >60 02/25/2019  ? ? ?Lab Results  ?Component Value Date  ? CEA 1.37 04/21/2021  ? ? ? ?Medications: I have reviewed the patient's current medications. ? ? ?Assessment/Plan: ?Colon cancer-stage III (T3N1), cecum mass biopsy 02/03/2021-adenocarcinoma ?CTs 02/16/2021-stable interstitial lung disease, mild COPD, cardiomegaly, CAD, multiple liver cysts with a few subcentimeter too small to characterize liver lesions, circumferential wall thickening at the ileocecal valve, right lower quadrant ileocolic lymph  node measures 1.1 cm ?03/24/2021, robotic assisted laparoscopic right colectomy-T3, 1/8 lymph nodes, 1 tumor deposit, lymphovascular invasion, mismatch repair protein expression intact, MSS ?Cycle 1 adjuvant Xeloda 04/21/2021 ?Cycle 2 adjuvant Xeloda 05/12/2021 ?Cycle 3 adjuvant Xeloda 06/07/2021 (5-day delay in start date due to foot discomfort, travel plans) ?Cycle 4 adjuvant Xeloda 06/29/2021, Xeloda dose reduced to 1000 mg twice daily for 14 days ?Cycle 5 adjuvant Xeloda 07/19/2021 ?Cycle 6 adjuvant Xeloda 08/09/2021 ?Cycle 7 adjuvant Xeloda 08/30/2021 ?CAD ?Interstitial lung disease ?Iron deficiency anemia secondary to #1 ?Multiple colon polyps on the colonoscopy 02/03/2021-microscopic fragments of adenocarcinoma involving biopsies of transverse and ascending colon polyps felt to be contaminant from the cecum biopsy ?2 tubular adenomas on the right colectomy specimen 03/24/2021 ?OSA ?Sick sinus syndrome-pacemaker ?Atrial fibrillation-maintained on apixaban ?Chronic diastolic CHF ?History of multiple skin cancers ?  ? ? ? ?Disposition: ?Donald [name] appears stable.  He will complete another cycle of Xeloda beginning today.  He will return for an office and lab visit in 3 weeks. ? ?Betsy Coder, MD ? ?08/30/2021  ?8:52 AM ? ? ?

## 2021-09-02 NOTE — Progress Notes (Signed)
Remote pacemaker transmission.   

## 2021-09-07 ENCOUNTER — Other Ambulatory Visit (HOSPITAL_COMMUNITY): Payer: Self-pay

## 2021-09-09 ENCOUNTER — Other Ambulatory Visit (HOSPITAL_COMMUNITY): Payer: Self-pay

## 2021-09-09 ENCOUNTER — Other Ambulatory Visit: Payer: Self-pay | Admitting: Oncology

## 2021-09-09 ENCOUNTER — Ambulatory Visit (HOSPITAL_COMMUNITY): Payer: PPO | Admitting: Nurse Practitioner

## 2021-09-09 DIAGNOSIS — C182 Malignant neoplasm of ascending colon: Secondary | ICD-10-CM

## 2021-09-10 ENCOUNTER — Other Ambulatory Visit (HOSPITAL_COMMUNITY): Payer: Self-pay

## 2021-09-10 MED ORDER — CAPECITABINE 500 MG PO TABS
1000.0000 mg | ORAL_TABLET | Freq: Two times a day (BID) | ORAL | 0 refills | Status: DC
  Filled 2021-09-10: qty 56, 21d supply, fill #0

## 2021-09-16 ENCOUNTER — Other Ambulatory Visit (HOSPITAL_COMMUNITY): Payer: Self-pay

## 2021-09-20 ENCOUNTER — Inpatient Hospital Stay: Payer: PPO | Admitting: Nurse Practitioner

## 2021-09-20 ENCOUNTER — Inpatient Hospital Stay: Payer: PPO | Attending: Oncology

## 2021-09-20 ENCOUNTER — Encounter: Payer: Self-pay | Admitting: Nurse Practitioner

## 2021-09-20 VITALS — BP 122/65 | HR 69 | Temp 98.2°F | Resp 18 | Ht 69.0 in | Wt 222.6 lb

## 2021-09-20 DIAGNOSIS — C180 Malignant neoplasm of cecum: Secondary | ICD-10-CM | POA: Diagnosis not present

## 2021-09-20 DIAGNOSIS — C182 Malignant neoplasm of ascending colon: Secondary | ICD-10-CM

## 2021-09-20 LAB — CMP (CANCER CENTER ONLY)
ALT: 16 U/L (ref 0–44)
AST: 19 U/L (ref 15–41)
Albumin: 4.1 g/dL (ref 3.5–5.0)
Alkaline Phosphatase: 62 U/L (ref 38–126)
Anion gap: 9 (ref 5–15)
BUN: 19 mg/dL (ref 8–23)
CO2: 28 mmol/L (ref 22–32)
Calcium: 9.6 mg/dL (ref 8.9–10.3)
Chloride: 103 mmol/L (ref 98–111)
Creatinine: 1.07 mg/dL (ref 0.61–1.24)
GFR, Estimated: >60 mL/min (ref >60)
Glucose, Bld: 144 mg/dL — ABNORMAL HIGH (ref 70–99)
Potassium: 4.7 mmol/L (ref 3.5–5.1)
Sodium: 140 mmol/L (ref 135–145)
Total Bilirubin: 1 mg/dL (ref 0.3–1.2)
Total Protein: 7.1 g/dL (ref 6.5–8.1)

## 2021-09-20 LAB — CBC WITH DIFFERENTIAL (CANCER CENTER ONLY)
Abs Immature Granulocytes: 0.02 10*3/uL (ref 0.00–0.07)
Basophils Absolute: 0.1 10*3/uL (ref 0.0–0.1)
Basophils Relative: 1 %
Eosinophils Absolute: 0.3 10*3/uL (ref 0.0–0.5)
Eosinophils Relative: 3 %
HCT: 42.1 % (ref 39.0–52.0)
Hemoglobin: 14.2 g/dL (ref 13.0–17.0)
Immature Granulocytes: 0 %
Lymphocytes Relative: 18 %
Lymphs Abs: 1.5 10*3/uL (ref 0.7–4.0)
MCH: 35.3 pg — ABNORMAL HIGH (ref 26.0–34.0)
MCHC: 33.7 g/dL (ref 30.0–36.0)
MCV: 104.7 fL — ABNORMAL HIGH (ref 80.0–100.0)
Monocytes Absolute: 0.5 10*3/uL (ref 0.1–1.0)
Monocytes Relative: 7 %
Neutro Abs: 5.8 10*3/uL (ref 1.7–7.7)
Neutrophils Relative %: 71 %
Platelet Count: 195 10*3/uL (ref 150–400)
RBC: 4.02 MIL/uL — ABNORMAL LOW (ref 4.22–5.81)
RDW: 14.6 % (ref 11.5–15.5)
WBC Count: 8.1 10*3/uL (ref 4.0–10.5)
nRBC: 0 % (ref 0.0–0.2)

## 2021-09-20 LAB — MAGNESIUM: Magnesium: 1.7 mg/dL (ref 1.7–2.4)

## 2021-09-20 NOTE — Progress Notes (Signed)
?  [facility] ?OFFICE PROGRESS NOTE ? ? ?Diagnosis:  Colon cancer ? ?INTERVAL HISTORY:  ? ?Mr. Scrivener returns as scheduled.  He completed cycle 7 Xeloda beginning 08/30/2021.  He denies nausea/vomiting.  Mouth is intermittently tender, no ulcers.  No diarrhea.  Palms and soles with mild tenderness, unchanged as compared to previous cycles.  He has lost a few toenails. ? ?Objective: ? ?Vital signs in last 24 hours: ? ?Blood pressure 122/65, pulse 69, temperature 98.2 ?F (36.8 ?C), temperature source Oral, resp. rate 18, height $RemoveBe'5\' 9"'hClFYpvjG$  (1.753 m), weight 222 lb 9.6 oz (101 kg), SpO2 96 %. ?  ? ?HEENT: No thrush or ulcers. ?Resp: Lungs clear bilaterally. ?Cardio: Regular rate and rhythm. ?GI: Abdomen soft and nontender.  No hepatosplenomegaly. ?Vascular: No leg edema. ?Skin: Palms and soles with mild erythema.  No skin breakdown. ? ? ?Lab Results: ? ?Lab Results  ?Component Value Date  ? WBC 8.1 09/20/2021  ? HGB 14.2 09/20/2021  ? HCT 42.1 09/20/2021  ? MCV 104.7 (H) 09/20/2021  ? PLT 195 09/20/2021  ? NEUTROABS 5.8 09/20/2021  ? ? ?Imaging: ? ?No results found. ? ?Medications: I have reviewed the patient's current medications. ? ?Assessment/Plan: ?Colon cancer-stage III (T3N1), cecum mass biopsy 02/03/2021-adenocarcinoma ?CTs 02/16/2021-stable interstitial lung disease, mild COPD, cardiomegaly, CAD, multiple liver cysts with a few subcentimeter too small to characterize liver lesions, circumferential wall thickening at the ileocecal valve, right lower quadrant ileocolic lymph node measures 1.1 cm ?03/24/2021, robotic assisted laparoscopic right colectomy-T3, 1/8 lymph nodes, 1 tumor deposit, lymphovascular invasion, mismatch repair protein expression intact, MSS ?Cycle 1 adjuvant Xeloda 04/21/2021 ?Cycle 2 adjuvant Xeloda 05/12/2021 ?Cycle 3 adjuvant Xeloda 06/07/2021 (5-day delay in start date due to foot discomfort, travel plans) ?Cycle 4 adjuvant Xeloda 06/29/2021, Xeloda dose reduced to 1000 mg twice daily for  14 days ?Cycle 5 adjuvant Xeloda 07/19/2021 ?Cycle 6 adjuvant Xeloda 08/09/2021 ?Cycle 7 adjuvant Xeloda 08/30/2021 ?Cycle 8 adjuvant Xeloda 09/20/2021 ?CAD ?Interstitial lung disease ?Iron deficiency anemia secondary to #1 ?Multiple colon polyps on the colonoscopy 02/03/2021-microscopic fragments of adenocarcinoma involving biopsies of transverse and ascending colon polyps felt to be contaminant from the cecum biopsy ?2 tubular adenomas on the right colectomy specimen 03/24/2021 ?OSA ?Sick sinus syndrome-pacemaker ?Atrial fibrillation-maintained on apixaban ?Chronic diastolic CHF ?History of multiple skin cancers ? ?Disposition: Mr. Moorman appears stable.  He has completed 7 cycles of adjuvant Xeloda.  Plan to proceed with cycle 8 today as scheduled. ? ?Labs from today reviewed, adequate to proceed as above. ? ?He will return for a baseline CEA and follow-up visit in approximately 4 weeks.  We are available to see him sooner if needed. ? ? ? ?Ned Card ANP/GNP-BC  ? ?09/20/2021  ?8:34 AM ? ? ? ? ? ? ? ?

## 2021-09-30 ENCOUNTER — Other Ambulatory Visit (HOSPITAL_COMMUNITY): Payer: Self-pay

## 2021-10-12 ENCOUNTER — Encounter: Payer: Self-pay | Admitting: Internal Medicine

## 2021-10-12 ENCOUNTER — Ambulatory Visit: Payer: PPO | Admitting: Internal Medicine

## 2021-10-12 VITALS — BP 118/78 | HR 60 | Ht 69.0 in | Wt 223.6 lb

## 2021-10-12 DIAGNOSIS — I480 Paroxysmal atrial fibrillation: Secondary | ICD-10-CM

## 2021-10-12 DIAGNOSIS — Z950 Presence of cardiac pacemaker: Secondary | ICD-10-CM | POA: Diagnosis not present

## 2021-10-12 DIAGNOSIS — I4892 Unspecified atrial flutter: Secondary | ICD-10-CM | POA: Diagnosis not present

## 2021-10-12 DIAGNOSIS — G4733 Obstructive sleep apnea (adult) (pediatric): Secondary | ICD-10-CM | POA: Diagnosis not present

## 2021-10-12 DIAGNOSIS — I495 Sick sinus syndrome: Secondary | ICD-10-CM

## 2021-10-12 DIAGNOSIS — I5032 Chronic diastolic (congestive) heart failure: Secondary | ICD-10-CM

## 2021-10-12 MED ORDER — DOFETILIDE 500 MCG PO CAPS
500.0000 ug | ORAL_CAPSULE | Freq: Two times a day (BID) | ORAL | 3 refills | Status: DC
Start: 2021-10-12 — End: 2021-11-08

## 2021-10-12 NOTE — Patient Instructions (Signed)
Medication Instructions:  Your physician recommends that you continue on your current medications as directed. Please refer to the Current Medication list given to you today.  *If you need a refill on your cardiac medications before your next appointment, please call your pharmacy*   Lab Work: None ordered.  If you have labs (blood work) drawn today and your tests are completely normal, you will receive your results only by: MyChart Message (if you have MyChart) OR A paper copy in the mail If you have any lab test that is abnormal or we need to change your treatment, we will call you to review the results.   Testing/Procedures: None ordered.    Follow-Up: At [facility], you and your health needs are our priority.  As part of our continuing mission to provide you with exceptional heart care, we have created designated Provider Care Teams.  These Care Teams include your primary Cardiologist (physician) and Advanced Practice Providers (APPs -  Physician Assistants and Nurse Practitioners) who all work together to provide you with the care you need, when you need it.  We recommend signing up for the patient portal called "MyChart".  Sign up information is provided on this After Visit Summary.  MyChart is used to connect with patients for Virtual Visits (Telemedicine).  Patients are able to view lab/test results, encounter notes, upcoming appointments, etc.  Non-urgent messages can be sent to your provider as well.   To learn more about what you can do with MyChart, go to [email or web address].    Your next appointment:    6 months with Dr Klein   Important Information About Sugar       

## 2021-10-12 NOTE — Progress Notes (Signed)
Patient Care Team: Virgie Dad, MD as PCP - General (Internal Medicine) Deboraha Sprang, MD as PCP - Cardiology (Cardiology) Deboraha Sprang, MD as PCP - Electrophysiology (Cardiology) Franchot Gallo, MD (Urology) [name], [name], MD (Dermatology) Monna Fam, MD as Consulting Physician (Ophthalmology) Deboraha Sprang, MD as Consulting Physician (Cardiology) Pyrtle, Lajuan Lines, MD as Consulting Physician (Gastroenterology) Brand Males, MD as Consulting Physician (Pulmonary Disease) Charlton Haws, [facility] as Pharmacist (Pharmacist)   HPI  Donald [name] is a 88 y.o. 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 (St Jude).  Anticoagulated with apixaban    Rhythm control with dofetilide since 1/20  Presented 3/23 with overall awareness of his heart beating fast and not feeling quite right.  Failed to pace terminated and underwent cardioversion successfully, has maintained sinus since and has felt much better. Marland Kitchen His wife is asking whether he could take medications for ED   He was started in 2017 on nitroglycerin because of dyspnea on exertion with a mildly abnormal Myoview for which was elected not to pursue more definitive diagnosis.  Catheterization subsequently demonstrated moderate nonobstructive disease.  He remains on a statin of the other things he needs a prescription for mag oxide   He is status post surgery for stage III colon cancer.  On chemotherapy.   DATE TEST EF   10/15    Echo  55 %  mild AS ( mean gradient 10) --? Bicuspid aortic valve  9/16 myoview 60-65%   mild ischemia  10/17 Echo 60-65%  No comment on bicuspid valve//mild - Mod  AI   11/18 Cath 55-65% Mod non obstructive CAD   1/20 Echo 60-65% LAE (52/2.5/45)  10/22 Echo   60-65% BAE     Date Cr K Mg Hgb TSH  10/17 0.97  4.6  13.2   0.56  4/18 1.07 5.2  13.9   1/19 1.26 5.1  14 0.46  1/20 1.04 4.7 2.1 13.6   10/20 0.97  4.8 2.0 14.0 0.68  3/23 1.00 4.5  13.7 1.9 (9/22)     thromboembolic risk factors ( age  -2, HTN-1, ) for a CHADSVASc Score of 3       Past Medical History:  Diagnosis Date   Abnormal CT scan, stomach 02/2014   Thickening of gastric fundus and cardia.  gastritis on EGD 03/2014   Allergic rhinitis due to pollen    Anemia 12/2020   Arthropathy, unspecified, site unspecified    Atherosclerosis    a. Noted by abdominal CT 02/2014 (h/o normal nuc 2011).   Atrial flutter ([place]) 02/24/2014   s/p ablation 11/15   Cardiomyopathy ([place])    tachy mediated - a. TEE (10/15):  EF 30%;  b. Echo after NSR restored (10/15):  mild LVH, EF 55-60%, mild AS, mild AI, mild MR, mild to mod LAE, mild RAE   CHF (congestive heart failure) ([place]) 01/04/2021   COVID-19 11/2020   Diverticulosis    severe in descending, sigmoid colon.    ED (erectile dysfunction)    Emphysema of lung ([place])    Esophageal stricture 03/2014   traversable with endoscope. not dilated.    Gastric AVM    GERD (gastroesophageal reflux disease) 03/2014   small HH and gastritis on EGD   GI bleed    Habitual alcohol use    Hemorrhoids    Hiatal hernia    Hyperlipidemia    Hypertension  Obesity    OSA on CPAP    PAF (paroxysmal atrial fibrillation) (HCC)    Pneumonia 12/2020   mild with covid   Presence of permanent cardiac pacemaker    Prostatitis, unspecified    Pulmonary fibrosis (HCC)    Sinus bradycardia    a. s/p STJ dual chamber PPM   Skin cancer    basal and squamous cell   Sleep apnea     Past Surgical History:  Procedure Laterality Date   ATRIAL FLUTTER ABLATION N/A 03/19/2014   RFCA of atrial flutter by Dr Caryl Comes   CARDIOVERSION N/A 02/24/2014   Procedure: CARDIOVERSION;  Surgeon: Lelon Perla, MD;  Location: [facility] ENDOSCOPY;  Service: Cardiovascular;  Laterality: N/A;   CARDIOVERSION Right 09/10/2014   Procedure: CARDIOVERSION;  Surgeon: Deboraha Sprang, MD;  Location: [facility] CATH LAB;  Service:  Cardiovascular;  Laterality: Right;   CARDIOVERSION N/A 01/22/2018   Procedure: CARDIOVERSION;  Surgeon: Fay Records, MD;  Location: [facility] ENDOSCOPY;  Service: Cardiovascular;  Laterality: N/A;   COLONOSCOPY N/A 04/23/2015   Procedure: COLONOSCOPY;  Surgeon: Ladene Artist, MD;  Location: [facility] ENDOSCOPY;  Service: Endoscopy;  Laterality: N/A;   COLONOSCOPY     ENTEROSCOPY N/A 06/12/2015   Procedure: ENTEROSCOPY;  Surgeon: Ladene Artist, MD;  Location: [facility] ENDOSCOPY;  Service: Endoscopy;  Laterality: N/A;   EP IMPLANTABLE DEVICE N/A 02/15/2016   Procedure: Pacemaker Implant;  Surgeon: Evans Lance, MD;  Location: [place] CV LAB;  Service: Cardiovascular;  Laterality: N/A;   ESOPHAGOGASTRODUODENOSCOPY N/A 04/09/2014   Procedure: ESOPHAGOGASTRODUODENOSCOPY (EGD);  Surgeon: Inda Castle, MD;  Location: [place];  Service: Endoscopy;  Laterality: N/A;   ESOPHAGOGASTRODUODENOSCOPY N/A 04/23/2015   Procedure: ESOPHAGOGASTRODUODENOSCOPY (EGD);  Surgeon: Ladene Artist, MD;  Location: [facility] ENDOSCOPY;  Service: Endoscopy;  Laterality: N/A;   INGUINAL HERNIA REPAIR     right   INGUINAL HERNIA REPAIR     left   LEFT HEART CATH AND CORONARY ANGIOGRAPHY N/A 03/16/2017   Procedure: LEFT HEART CATH AND CORONARY ANGIOGRAPHY;  Surgeon: Burnell Blanks, MD;  Location: [place] CV LAB;  Service: Cardiovascular;  Laterality: N/A;   ORIF fracture of the elbow  1992   SKIN CANCER EXCISION  05/21/2012   Squamous cell ca   TEE WITHOUT CARDIOVERSION N/A 02/24/2014   Procedure: TRANSESOPHAGEAL ECHOCARDIOGRAM (TEE);  Surgeon: Lelon Perla, MD;  Location: [facility] ENDOSCOPY;  Service: Cardiovascular;  Laterality: N/A;   TONSILLECTOMY     UPPER GASTROINTESTINAL ENDOSCOPY      Current Outpatient Medications  Medication Sig Dispense Refill   acetaminophen (TYLENOL) 500 MG tablet Take 500 mg by mouth every 6 (six) hours as needed for moderate pain or headache.     albuterol (VENTOLIN HFA) 108 (90  Base) MCG/ACT inhaler Inhale TWO puffs into THE lungs every SIX hours as needed FOR wheezing OR shortness of breath 8.5 g 3   carboxymethylcellulose (REFRESH PLUS) 0.5 % SOLN Place 1 drop into both eyes daily as needed (dry eyes).     diltiazem (CARDIZEM CD) 120 MG 24 hr capsule Take 1 capsule (120 mg total) by mouth 2 (two) times daily. 180 capsule 3   dofetilide (TIKOSYN) 500 MCG capsule Take 1 capsule (500 mcg total) by mouth 2 (two) times daily. Please keep upcoming appt in May 2022 with Dr. Caryl Comes before anymore refills. Thank you 60 capsule 11   ELIQUIS 5 MG TABS tablet TAKE ONE TABLET BY MOUTH EVERY MORNING and TAKE ONE TABLET  BY MOUTH EVERYDAY AT BEDTIME 60 tablet 6   furosemide (LASIX) 20 MG tablet TAKE ONE TABLET BY MOUTH ONCE DAILY 90 tablet 3   isosorbide mononitrate (IMDUR) 60 MG 24 hr tablet Take 1 tablet (60 mg total) by mouth daily. 90 tablet 3   Magnesium Oxide 400 MG CAPS Take 1 capsule (400 mg total) by mouth daily. 30 capsule 6   metoprolol tartrate (LOPRESSOR) 25 MG tablet TAKE ONE TABLET BY MOUTH EVERY MORNING AND TAKE ONE TABLET BY MOUTH EVERY DAY AT BEDTIME 60 tablet 11   nitroGLYCERIN (NITROSTAT) 0.4 MG SL tablet PLACE 1 TABLET UNDER THE TONGUE EVERY 5 MINUTES AS NEEDED FOR CHEST PAIN, MAY REPEAT FOR 3 DOSES. 12 tablet 0   Pedialyte (PEDIALYTE) SOLN Take by mouth daily. 6 oz     Propylhexedrine (BENZEDREX) INHA Place 1 each into the nose daily as needed (congestion).     rosuvastatin (CRESTOR) 10 MG tablet TAKE ONE TABLET BY MOUTH ONCE DAILY 90 tablet 3   sodium chloride (OCEAN) 0.65 % SOLN nasal spray Place 1 spray into both nostrils as needed for congestion.     Wheat Dextrin (BENEFIBER) POWD Take 1 Scoop by mouth daily as needed (constipation).     capecitabine (XELODA) 500 MG tablet Take 2 tablets (1,000 mg total) by mouth 2 (two) times daily after a meal. Take for 14 days, then off 7 days. Repeat every 21 days. Start this cycle on 09/20/21 (Patient not taking: Reported on  10/12/2021) 56 tablet 0   No current facility-administered medications for this visit.    No Known Allergies  Review of Systems negative except from HPI and PMH  Physical Exam BP 118/78   Pulse 60   Ht '5\' 9"'$  (1.753 m)   Wt 223 lb 9.6 oz (101.4 kg)   SpO2 94%   BMI 33.02 kg/m  Well developed and well nourished in no acute distress HENT normal Neck supple with JVP-flat Clear Device pocket well healed; without hematoma or erythema.  There is no tethering  Regular rate and rhythm, no  gallop 2/6 murmur Abd-soft with active BS No Clubbing cyanosis  edema Skin-warm and dry A & Oriented  Grossly normal sensory and motor function  ECG atrial pacing at 60 Intervals 20/09/45 Otherwise normal  Assessment and  Plan  Atrial fibrillation/flutter-paroxysmal  CHADS-VASc score 5 (age-36 hypertension-1 cardiomyopathy-1 vascular disease-1)  Aortic Valve Bicuspid previously described but no longer evident  Cardiomyopathy-resolved  Pacemaker-St. Jude     Hypertension  HFpEF-chronic  Exertional dyspnea Cath non obstructive disease  Dofetilide   High Risk Medication Surveillance  ED  Persistent atrial fibrillation.  Status post cardioversion.  Holding sinus rhythm on dofetilide 500 mcg twice daily.  Surveillance laboratories normal.  Have given a prescription for [email or web address] to find the medication.  Continue mag oxide 400    No bleeding.  Continue him on apixaban 5 mg twice daily.  Blood pressure well controlled.  Continue on Lopressor 25 mg twice daily and Cardizem 120 twice daily.  He would like to begin sexual relations again with his wife.  Currently he is taking isosorbide, I reviewed the notes back from 2017, started for exertional dyspnea with a mildly abnormal Myoview which was subsequently associated with a nonobstructive catheterization.  We will stop the isosorbide.  I have alerted him to symptoms that may recur including dyspnea  and or hypertension.  Euvolemic.  We will continue him on furosemide 20

## 2021-10-18 ENCOUNTER — Inpatient Hospital Stay: Payer: PPO | Admitting: Oncology

## 2021-10-18 ENCOUNTER — Inpatient Hospital Stay: Payer: PPO | Attending: Oncology

## 2021-10-18 VITALS — BP 123/63 | HR 71 | Temp 98.2°F | Resp 18 | Ht 69.0 in | Wt 224.4 lb

## 2021-10-18 DIAGNOSIS — C180 Malignant neoplasm of cecum: Secondary | ICD-10-CM | POA: Insufficient documentation

## 2021-10-18 DIAGNOSIS — C182 Malignant neoplasm of ascending colon: Secondary | ICD-10-CM

## 2021-10-18 DIAGNOSIS — M79643 Pain in unspecified hand: Secondary | ICD-10-CM | POA: Diagnosis not present

## 2021-10-18 DIAGNOSIS — M79673 Pain in unspecified foot: Secondary | ICD-10-CM | POA: Insufficient documentation

## 2021-10-18 LAB — CEA (ACCESS): CEA (CHCC): 2.16 ng/mL (ref 0.00–5.00)

## 2021-10-18 NOTE — Progress Notes (Signed)
[facility] OFFICE PROGRESS NOTE   Diagnosis: Colon cancer  INTERVAL HISTORY:   Donald [name] completed a final cycle of Xeloda beginning 09/20/2021.  He reports mild diarrhea.  This predated the start of Xeloda.  He continues to have mild discomfort at the hands and feet.  Good appetite.  He is working for [employer] 3 days/week.  Objective:  Vital signs in last 24 hours:  Blood pressure 123/63, pulse 71, temperature 98.2 F (36.8 C), temperature source Oral, resp. rate 18, height $RemoveBe'5\' 9"'NgaTBFnLD$  (1.753 m), weight 224 lb 6.4 oz (101.8 kg), SpO2 96 %.    HEENT: No thrush or ulcers Lymphatics: No cervical, supraclavicular, axillary, or inguinal nodes Resp: Lungs clear bilaterally Cardio: Regular rate and rhythm GI: No hepatosplenomegaly, no mass, nontender Vascular: No leg edema  Skin: Mild skin thickening and hyperpigmentation of the palms and soles.   Lab Results:  Lab Results  Component Value Date   WBC 8.1 09/20/2021   HGB 14.2 09/20/2021   HCT 42.1 09/20/2021   MCV 104.7 (H) 09/20/2021   PLT 195 09/20/2021   NEUTROABS 5.8 09/20/2021    CMP  Lab Results  Component Value Date   NA 140 09/20/2021   K 4.7 09/20/2021   CL 103 09/20/2021   CO2 28 09/20/2021   GLUCOSE 144 (H) 09/20/2021   BUN 19 09/20/2021   CREATININE 1.07 09/20/2021   CALCIUM 9.6 09/20/2021   PROT 7.1 09/20/2021   ALBUMIN 4.1 09/20/2021   AST 19 09/20/2021   ALT 16 09/20/2021   ALKPHOS 62 09/20/2021   BILITOT 1.0 09/20/2021   GFRNONAA >60 09/20/2021   GFRAA >60 02/25/2019    Lab Results  Component Value Date   CEA 1.37 04/21/2021    Medications: I have reviewed the patient's current medications.   Assessment/Plan: Colon cancer-stage III (T3N1), cecum mass biopsy 02/03/2021-adenocarcinoma CTs 02/16/2021-stable interstitial lung disease, mild COPD, cardiomegaly, CAD, multiple liver cysts with a few subcentimeter too small to characterize liver lesions, circumferential wall  thickening at the ileocecal valve, right lower quadrant ileocolic lymph node measures 1.1 cm 03/24/2021, robotic assisted laparoscopic right colectomy-T3, 1/8 lymph nodes, 1 tumor deposit, lymphovascular invasion, mismatch repair protein expression intact, MSS Cycle 1 adjuvant Xeloda 04/21/2021 Cycle 2 adjuvant Xeloda 05/12/2021 Cycle 3 adjuvant Xeloda 06/07/2021 (5-day delay in start date due to foot discomfort, travel plans) Cycle 4 adjuvant Xeloda 06/29/2021, Xeloda dose reduced to 1000 mg twice daily for 14 days Cycle 5 adjuvant Xeloda 07/19/2021 Cycle 6 adjuvant Xeloda 08/09/2021 Cycle 7 adjuvant Xeloda 08/30/2021 Cycle 8 adjuvant Xeloda 09/20/2021 CAD Interstitial lung disease Iron deficiency anemia secondary to #1 Multiple colon polyps on the colonoscopy 02/03/2021-microscopic fragments of adenocarcinoma involving biopsies of transverse and ascending colon polyps felt to be contaminant from the cecum biopsy 2 tubular adenomas on the right colectomy specimen 03/24/2021 OSA Sick sinus syndrome-pacemaker Atrial fibrillation-maintained on apixaban Chronic diastolic CHF History of multiple skin cancers       Disposition: Donald [name] has completed the course of adjuvant Xeloda.  He tolerated the treatment well.  He has mild symptoms of hand/foot syndrome.  This should improve over the next several weeks.  We will follow-up on the CEA from today.  He will return for an office visit and surveillance imaging in October.  We will discuss the indication for a surveillance colonoscopy when he is here in October.  He plans to follow-up with Dr. Hilarie Fredrickson if the diarrhea persists.   Betsy Coder, MD  10/18/2021  8:54 AM

## 2021-10-19 ENCOUNTER — Telehealth: Payer: Self-pay

## 2021-10-19 NOTE — Telephone Encounter (Signed)
Pt verbalized understanding.

## 2021-10-19 NOTE — Telephone Encounter (Signed)
-----   Message from Ladell Pier, MD sent at 10/18/2021  5:08 PM EDT ----- Please call patient, CEA is normal, follow-up as scheduled

## 2021-10-26 DIAGNOSIS — D692 Other nonthrombocytopenic purpura: Secondary | ICD-10-CM | POA: Diagnosis not present

## 2021-10-26 DIAGNOSIS — Z85828 Personal history of other malignant neoplasm of skin: Secondary | ICD-10-CM | POA: Diagnosis not present

## 2021-10-26 DIAGNOSIS — L821 Other seborrheic keratosis: Secondary | ICD-10-CM | POA: Diagnosis not present

## 2021-10-26 DIAGNOSIS — L570 Actinic keratosis: Secondary | ICD-10-CM | POA: Diagnosis not present

## 2021-10-26 DIAGNOSIS — D485 Neoplasm of uncertain behavior of skin: Secondary | ICD-10-CM | POA: Diagnosis not present

## 2021-10-26 DIAGNOSIS — C44519 Basal cell carcinoma of skin of other part of trunk: Secondary | ICD-10-CM | POA: Diagnosis not present

## 2021-10-26 DIAGNOSIS — D225 Melanocytic nevi of trunk: Secondary | ICD-10-CM | POA: Diagnosis not present

## 2021-11-07 ENCOUNTER — Other Ambulatory Visit: Payer: Self-pay | Admitting: Cardiology

## 2021-11-14 ENCOUNTER — Other Ambulatory Visit: Payer: Self-pay | Admitting: Internal Medicine

## 2021-11-17 ENCOUNTER — Ambulatory Visit (INDEPENDENT_AMBULATORY_CARE_PROVIDER_SITE_OTHER): Payer: PPO

## 2021-11-17 DIAGNOSIS — G4733 Obstructive sleep apnea (adult) (pediatric): Secondary | ICD-10-CM | POA: Diagnosis not present

## 2021-11-17 DIAGNOSIS — I495 Sick sinus syndrome: Secondary | ICD-10-CM | POA: Diagnosis not present

## 2021-11-17 NOTE — Telephone Encounter (Signed)
Prescription refill request for Eliquis received. Indication: PAF Last office visit: 10/12/21  Olin Pia MD Scr: 1.07 on 09/20/21 Age: 88 Weight: 101.4kg  Based on above findings Eliquis '5mg'$  twice daily is the appropriate dose.  Refill approved.

## 2021-11-20 LAB — CUP PACEART REMOTE DEVICE CHECK
Battery Remaining Longevity: 50 mo
Battery Remaining Percentage: 44 %
Battery Voltage: 2.98 V
Brady Statistic AP VP Percent: 1 %
Brady Statistic AP VS Percent: 99 %
Brady Statistic AS VP Percent: 1 %
Brady Statistic AS VS Percent: 1 %
Brady Statistic RA Percent Paced: 99 %
Brady Statistic RV Percent Paced: 1 %
Date Time Interrogation Session: 20230704034856
Implantable Lead Implant Date: 20171002
Implantable Lead Implant Date: 20171002
Implantable Lead Location: 753859
Implantable Lead Location: 753860
Implantable Pulse Generator Implant Date: 20171002
Lead Channel Impedance Value: 380 Ohm
Lead Channel Impedance Value: 510 Ohm
Lead Channel Pacing Threshold Amplitude: 0.625 V
Lead Channel Pacing Threshold Amplitude: 0.75 V
Lead Channel Pacing Threshold Pulse Width: 0.5 ms
Lead Channel Pacing Threshold Pulse Width: 0.5 ms
Lead Channel Sensing Intrinsic Amplitude: 12 mV
Lead Channel Sensing Intrinsic Amplitude: 3.7 mV
Lead Channel Setting Pacing Amplitude: 1.625
Lead Channel Setting Pacing Amplitude: 2.5 V
Lead Channel Setting Pacing Pulse Width: 0.5 ms
Lead Channel Setting Sensing Sensitivity: 2 mV
Pulse Gen Model: 2272
Pulse Gen Serial Number: 7951215

## 2021-11-22 ENCOUNTER — Encounter: Payer: Self-pay | Admitting: Adult Health

## 2021-11-22 ENCOUNTER — Non-Acute Institutional Stay: Payer: PPO | Admitting: Adult Health

## 2021-11-22 VITALS — BP 116/78 | HR 61 | Temp 98.1°F | Ht 69.0 in | Wt 223.6 lb

## 2021-11-22 DIAGNOSIS — J849 Interstitial pulmonary disease, unspecified: Secondary | ICD-10-CM | POA: Diagnosis not present

## 2021-11-22 DIAGNOSIS — Z950 Presence of cardiac pacemaker: Secondary | ICD-10-CM | POA: Diagnosis not present

## 2021-11-22 DIAGNOSIS — D500 Iron deficiency anemia secondary to blood loss (chronic): Secondary | ICD-10-CM

## 2021-11-22 DIAGNOSIS — I2510 Atherosclerotic heart disease of native coronary artery without angina pectoris: Secondary | ICD-10-CM

## 2021-11-22 DIAGNOSIS — K580 Irritable bowel syndrome with diarrhea: Secondary | ICD-10-CM

## 2021-11-22 DIAGNOSIS — I10 Essential (primary) hypertension: Secondary | ICD-10-CM | POA: Diagnosis not present

## 2021-11-22 DIAGNOSIS — N528 Other male erectile dysfunction: Secondary | ICD-10-CM | POA: Diagnosis not present

## 2021-11-22 DIAGNOSIS — C182 Malignant neoplasm of ascending colon: Secondary | ICD-10-CM

## 2021-11-22 DIAGNOSIS — E785 Hyperlipidemia, unspecified: Secondary | ICD-10-CM

## 2021-11-22 DIAGNOSIS — I480 Paroxysmal atrial fibrillation: Secondary | ICD-10-CM | POA: Diagnosis not present

## 2021-11-22 NOTE — Progress Notes (Signed)
Location:  [facility]  POS: Clinic  Provider: Fletcher Anon, ANP  Goals of Care:     09/20/2021    8:10 AM  Advanced Directives  Does Patient Have a Medical Advance Directive? Yes  Type of Advance Directive Living will;Healthcare Power of Attorney  Does patient want to make changes to medical advance directive? No - Patient declined  Would patient like information on creating a medical advance directive? No - Patient declined     Chief Complaint  Patient presents with   Medical Management of Chronic Issues    Patient returns to the clinic for his 6 month follow up. He would like to discuss him finishing up his chemotherapy, testosterone and diarrhea.     HPI: Patient is a 88 y.o. male seen today for medical management of chronic diseases.    PMH significant for aflutter s/p ablation, cardiomyopathy, CHF, gastritis, anemia, COPD, OSA, pulmonary fibrosis, AVM, pacemaker, GERD, GIB, HLD, HTN, afib.  Had final cycle of Xeloda 09/20/21 per oncology notes due to hx of Colon cancer-stage III (T3N1), cecum mass biopsy 02/03/2021 Adenocarcinoma.  03/24/2021, robotic assisted laparoscopic right colectomy CEA 2.16 10/18/21  Has 3 loose stools per day. Has been off and on for years.  Has seen GI in the past. No clear etiology. Seemed to worsen with chemo. No fever or vomiting. Has tried fiber. Has tried imodium but became constipated.   He is having some issues with erectile dysfunction. No voiding issues. Has seen urology in the past. Does have cardiac hx.   Anemia has resolved Hgb 14.2 09/20/21  CT of the chest 02/16/21 for pre op clearance showed stable usual interstitial pneumonia vs nonspecific interstitial pneumonia. COPD, aortic atherosclerosis, and 3 vessel CAD.   Uses albuterol 1-2 times daily. No significant sob or cough at this time.   Past Medical History:  Diagnosis Date   Abnormal CT scan, stomach 02/2014   Thickening of gastric fundus and cardia.  gastritis on EGD 03/2014    Allergic rhinitis due to pollen    Anemia 12/2020   Arthropathy, unspecified, site unspecified    Atherosclerosis    a. Noted by abdominal CT 02/2014 (h/o normal nuc 2011).   Atrial flutter (HCC) 02/24/2014   s/p ablation 11/15   Cardiomyopathy (HCC)    tachy mediated - a. TEE (10/15):  EF 30%;  b. Echo after NSR restored (10/15):  mild LVH, EF 55-60%, mild AS, mild AI, mild MR, mild to mod LAE, mild RAE   CHF (congestive heart failure) (HCC) 01/04/2021   COVID-19 11/2020   Diverticulosis    severe in descending, sigmoid colon.    ED (erectile dysfunction)    Emphysema of lung (HCC)    Esophageal stricture 03/2014   traversable with endoscope. not dilated.    Gastric AVM    GERD (gastroesophageal reflux disease) 03/2014   small HH and gastritis on EGD   GI bleed    Habitual alcohol use    Hemorrhoids    Hiatal hernia    Hyperlipidemia    Hypertension    Obesity    OSA on CPAP    PAF (paroxysmal atrial fibrillation) (HCC)    Pneumonia 12/2020   mild with covid   Presence of permanent cardiac pacemaker    Prostatitis, unspecified    Pulmonary fibrosis (HCC)    Sinus bradycardia    a. s/p STJ dual chamber PPM   Skin cancer    basal and squamous cell   Sleep apnea  Past Surgical History:  Procedure Laterality Date   ATRIAL FLUTTER ABLATION N/A 03/19/2014   RFCA of atrial flutter by Dr Graciela Husbands   CARDIOVERSION N/A 02/24/2014   Procedure: CARDIOVERSION;  Surgeon: Lewayne Bunting, MD;  Location: [facility] ENDOSCOPY;  Service: Cardiovascular;  Laterality: N/A;   CARDIOVERSION Right 09/10/2014   Procedure: CARDIOVERSION;  Surgeon: Duke Salvia, MD;  Location: [facility] CATH LAB;  Service: Cardiovascular;  Laterality: Right;   CARDIOVERSION N/A 01/22/2018   Procedure: CARDIOVERSION;  Surgeon: Pricilla Riffle, MD;  Location: [facility] ENDOSCOPY;  Service: Cardiovascular;  Laterality: N/A;   COLONOSCOPY N/A 04/23/2015   Procedure: COLONOSCOPY;  Surgeon: Meryl Dare, MD;  Location: [facility]  ENDOSCOPY;  Service: Endoscopy;  Laterality: N/A;   COLONOSCOPY     ENTEROSCOPY N/A 06/12/2015   Procedure: ENTEROSCOPY;  Surgeon: Meryl Dare, MD;  Location: [facility] ENDOSCOPY;  Service: Endoscopy;  Laterality: N/A;   EP IMPLANTABLE DEVICE N/A 02/15/2016   Procedure: Pacemaker Implant;  Surgeon: Marinus Maw, MD;  Location: [facility] INVASIVE CV LAB;  Service: Cardiovascular;  Laterality: N/A;   ESOPHAGOGASTRODUODENOSCOPY N/A 04/09/2014   Procedure: ESOPHAGOGASTRODUODENOSCOPY (EGD);  Surgeon: Louis Meckel, MD;  Location: [facility] ENDOSCOPY;  Service: Endoscopy;  Laterality: N/A;   ESOPHAGOGASTRODUODENOSCOPY N/A 04/23/2015   Procedure: ESOPHAGOGASTRODUODENOSCOPY (EGD);  Surgeon: Meryl Dare, MD;  Location: [facility] ENDOSCOPY;  Service: Endoscopy;  Laterality: N/A;   INGUINAL HERNIA REPAIR     right   INGUINAL HERNIA REPAIR     left   LEFT HEART CATH AND CORONARY ANGIOGRAPHY N/A 03/16/2017   Procedure: LEFT HEART CATH AND CORONARY ANGIOGRAPHY;  Surgeon: Kathleene Hazel, MD;  Location: [facility] INVASIVE CV LAB;  Service: Cardiovascular;  Laterality: N/A;   ORIF fracture of the elbow  1992   SKIN CANCER EXCISION  05/21/2012   Squamous cell ca   TEE WITHOUT CARDIOVERSION N/A 02/24/2014   Procedure: TRANSESOPHAGEAL ECHOCARDIOGRAM (TEE);  Surgeon: Lewayne Bunting, MD;  Location: [facility] ENDOSCOPY;  Service: Cardiovascular;  Laterality: N/A;   TONSILLECTOMY     UPPER GASTROINTESTINAL ENDOSCOPY      No Known Allergies  Outpatient Encounter Medications as of 11/22/2021  Medication Sig   acetaminophen (TYLENOL) 500 MG tablet Take 500 mg by mouth every 6 (six) hours as needed for moderate pain or headache.   albuterol (VENTOLIN HFA) 108 (90 Base) MCG/ACT inhaler Inhale TWO puffs into THE lungs every SIX hours as needed FOR wheezing OR shortness of breath   apixaban (ELIQUIS) 5 MG TABS tablet TAKE ONE TABLET BY MOUTH EVERY MORNING AND TAKE ONE TABLET BY MOUTH EVERY DAY AT BEDTIME   carboxymethylcellulose  (REFRESH PLUS) 0.5 % SOLN Place 1 drop into both eyes daily as needed (dry eyes).   diltiazem (CARDIZEM CD) 120 MG 24 hr capsule TAKE ONE CAPSULE BY MOUTH AT BREAKFAST AND AT BEDTIME   dofetilide (TIKOSYN) 500 MCG capsule TAKE 1 CAPSULE BY MOUTH EVERY MORNING AND 1 CAPSULE AT BEDTIME   furosemide (LASIX) 20 MG tablet TAKE ONE TABLET BY MOUTH ONCE DAILY   isosorbide mononitrate (IMDUR) 60 MG 24 hr tablet Take 1 tablet (60 mg total) by mouth daily.   Magnesium Oxide 400 MG CAPS Take 1 capsule (400 mg total) by mouth daily.   metoprolol tartrate (LOPRESSOR) 25 MG tablet TAKE ONE TABLET BY MOUTH EVERY MORNING AND TAKE ONE TABLET BY MOUTH EVERY DAY AT BEDTIME   nitroGLYCERIN (NITROSTAT) 0.4 MG SL tablet PLACE 1 TABLET UNDER THE TONGUE EVERY 5 MINUTES AS NEEDED FOR  CHEST PAIN, MAY REPEAT FOR 3 DOSES.   Pedialyte (PEDIALYTE) SOLN Take by mouth daily. 6 oz   Propylhexedrine (BENZEDREX) INHA Place 1 each into the nose daily as needed (congestion).   rosuvastatin (CRESTOR) 10 MG tablet TAKE ONE TABLET BY MOUTH ONCE DAILY   sodium chloride (OCEAN) 0.65 % SOLN nasal spray Place 1 spray into both nostrils as needed for congestion.   Wheat Dextrin (BENEFIBER) POWD Take 1 Scoop by mouth daily as needed (constipation).   [DISCONTINUED] capecitabine (XELODA) 500 MG tablet Take 2 tablets (1,000 mg total) by mouth 2 (two) times daily after a meal. Take for 14 days, then off 7 days. Repeat every 21 days. Start this cycle on 09/20/21 (Patient not taking: Reported on 10/12/2021)   No facility-administered encounter medications on file as of 11/22/2021.    Review of Systems:  Review of Systems  Health Maintenance  Topic Date Due   COVID-19 Vaccine (6 - Booster for Moderna series) 12/13/2021   INFLUENZA VACCINE  12/14/2021   TETANUS/TDAP  04/16/2028   Pneumonia Vaccine 72+ Years old  Completed   Zoster Vaccines- Shingrix  Completed   HPV VACCINES  Aged Out    Physical Exam: Vitals:   11/22/21 1251  BP: 116/78   Pulse: 61  Temp: 98.1 F (36.7 C)  SpO2: 96%  Weight: 223 lb 9.6 oz (101.4 kg)  Height: 5\' 9"  (1.753 m)   Body mass index is 33.02 kg/m. Wt Readings from Last 3 Encounters:  11/22/21 223 lb 9.6 oz (101.4 kg)  10/18/21 224 lb 6.4 oz (101.8 kg)  10/12/21 223 lb 9.6 oz (101.4 kg)    Physical Exam  Labs reviewed: Basic Metabolic Panel: Recent Labs    01/07/21 0000 02/03/21 1255 02/12/21 1202 03/08/21 0950 08/06/21 0918 08/30/21 0816 09/20/21 0751  NA 138  138  --  142   < > 138 139 140  K 4.5  4.5  --  4.8   < > 4.5 4.7 4.7  CL 103  103  --  104   < > 105 104 103  CO2 23*  23*  --  22   < > 27 28 28   GLUCOSE  --   --  104*   < > 105* 127* 144*  BUN 19  19   < > 16   < > 17 16 19   CREATININE 1.0  1.0   < > 1.18   < > 1.00 1.03 1.07  CALCIUM 9.2  9.2  --  9.2   < > 9.5 9.5 9.6  MG  --   --  1.9  --   --   --  1.7  TSH 0.63  --   --   --   --   --   --    < > = values in this interval not displayed.   Liver Function Tests: Recent Labs    08/06/21 0918 08/30/21 0816 09/20/21 0751  AST 17 21 19   ALT 14 17 16   ALKPHOS 67 71 62  BILITOT 0.7 1.1 1.0  PROT 7.1 7.4 7.1  ALBUMIN 4.1 4.3 4.1   No results for input(s): "LIPASE", "AMYLASE" in the last 8760 hours. No results for input(s): "AMMONIA" in the last 8760 hours. CBC: Recent Labs    08/06/21 0918 08/30/21 0816 09/20/21 0751  WBC 8.5 8.0 8.1  NEUTROABS 5.8 5.8 5.8  HGB 13.7 14.5 14.2  HCT 40.1 42.4 42.1  MCV 101.8* 103.7* 104.7*  PLT 188 188  195   Lipid Panel: Recent Labs    01/07/21 0000  CHOL 98  HDL 38  LDLCALC 51  TRIG 50   Lab Results  Component Value Date   HGBA1C 5.5 03/08/2021    Procedures since last visit: CUP PACEART REMOTE DEVICE CHECK  Result Date: 11/20/2021 Scheduled remote reviewed. Normal device function.  1 AMS episode, < 1 minute Next remote 91 days.   Assessment/Plan  1. Malignant neoplasm of ascending colon (HCC) S/p chemo, surgery Doing well  2. Irritable  bowel syndrome with diarrhea Possible dx, no resolution after colon ca resection.  On fiber supplement Could try imodium 1/2 tab twice weekly Would follow up with GI  3. ILD (interstitial lung disease) (HCC) Should use albuterol as needed for sob, cough, wheeze Followed by pulmonary   4. Paroxysmal atrial fibrillation (HCC) Regular on exam, rate controlled On eliquis for CVA risk reduction.   5. Essential hypertension Controlled   6. Coronary artery disease involving native coronary artery of native heart without angina pectoris On statin and eliquis  Follows with cardiology   7. Pacemaker noted  8. Iron deficiency anemia due to chronic blood loss resolved  9. Hyperlipidemia with target LDL less than 100 On Crestor Lab Results  Component Value Date   CHOL 98 01/07/2021   HDL 38 01/07/2021   LDLCALC 51 01/07/2021   LDLDIRECT 138.8 04/18/2011   TRIG 50 01/07/2021   CHOLHDL 4 03/05/2020     10. Other male erectile dysfunction Would f/u with urology for possible med recommendation, has cardiac hx   Labs/tests ordered:  * No order type specified * Lipid A1C could be done with labs at cancer center or we can do here.   Next appt:  6 months with Dr Chales Abrahams.    Total time :  time greater than 50% of total time spent doing pt counseling, chart review, coordination of care.

## 2021-11-23 ENCOUNTER — Encounter: Payer: Self-pay | Admitting: Adult Health

## 2021-11-28 ENCOUNTER — Encounter: Payer: Self-pay | Admitting: Internal Medicine

## 2021-11-29 ENCOUNTER — Ambulatory Visit: Payer: PPO | Admitting: Pulmonary Disease

## 2021-11-29 ENCOUNTER — Encounter: Payer: Self-pay | Admitting: Pulmonary Disease

## 2021-11-29 VITALS — BP 140/62 | HR 73 | Temp 98.0°F | Ht 69.0 in | Wt 223.4 lb

## 2021-11-29 DIAGNOSIS — Z9989 Dependence on other enabling machines and devices: Secondary | ICD-10-CM | POA: Diagnosis not present

## 2021-11-29 DIAGNOSIS — J849 Interstitial pulmonary disease, unspecified: Secondary | ICD-10-CM | POA: Diagnosis not present

## 2021-11-29 DIAGNOSIS — G4733 Obstructive sleep apnea (adult) (pediatric): Secondary | ICD-10-CM | POA: Diagnosis not present

## 2021-11-29 NOTE — Patient Instructions (Signed)
I am glad you are doing well with regard to your breathing You have a CT scan ordered by your oncologist in 3 months.  We will review those for reevaluation of interstitial lung disease Will order PFTs in 6 months and follow-up in clinic in 6 months after PFTs

## 2021-11-29 NOTE — Progress Notes (Signed)
Donald [name]    [id number]    1932/12/31  Primary Care Physician:Gupta, Rene Kocher, MD  Referring Physician: Virgie Dad, MD [place],  [place] [id number]  Chief complaint: Follow-up for pulmonary fibrosis  HPI: 88 year old with recurrent atrial fibrillation, OSA, ex-smoker.  Evaluated in the ILD clinic on 05/24/2022 after high-resolution CT scan that showed pulmonary fibrosis.  At that point he was being considered for amiodarone for atrial fibrillation.  Seen by Dr. Chase Caller and recommended against amiodarone.  He is currently on Tikosyn and follows with Dr. Caryl Comes, EP   Pets: No pets Occupation: Worked as a Land Exposures: Had a down pillow and comforters which he got rid of last month.  No ongoing exposure.  No mold, hot tub, Jacuzzi, asbestos, humidifier Smoking history: 50-pack-year smoker.  Quit in 2000 Travel history: From [place].  No significant Relevant family history: Brother died of pulmonary fibrosis.  Interim history: Here for follow-up of indeterminate pulmonary fibrosis His breathing is doing well with no issues He remains active by doing swimming and water aerobics for half an hour every day.  Had right colectomy on November 2022 for a diagnosis of stage III colon cancer.  And completed chemotherapy under the care of Dr. Benay Spice.  He does note some worsening dyspnea while on chemotherapy but is recovering now.  Outpatient Encounter Medications as of 11/29/2021  Medication Sig   acetaminophen (TYLENOL) 500 MG tablet Take 500 mg by mouth every 6 (six) hours as needed for moderate pain or headache.   albuterol (VENTOLIN HFA) 108 (90 Base) MCG/ACT inhaler Inhale TWO puffs into THE lungs every SIX hours as needed FOR wheezing OR shortness of breath   apixaban (ELIQUIS) 5 MG TABS tablet TAKE ONE TABLET BY MOUTH EVERY MORNING AND TAKE ONE TABLET BY MOUTH EVERY DAY AT BEDTIME   carboxymethylcellulose (REFRESH PLUS) 0.5 %  SOLN Place 1 drop into both eyes daily as needed (dry eyes).   diltiazem (CARDIZEM CD) 120 MG 24 hr capsule TAKE ONE CAPSULE BY MOUTH AT BREAKFAST AND AT BEDTIME   dofetilide (TIKOSYN) 500 MCG capsule TAKE 1 CAPSULE BY MOUTH EVERY MORNING AND 1 CAPSULE AT BEDTIME   furosemide (LASIX) 20 MG tablet TAKE ONE TABLET BY MOUTH ONCE DAILY   isosorbide mononitrate (IMDUR) 60 MG 24 hr tablet Take 1 tablet (60 mg total) by mouth daily.   Magnesium Oxide 400 MG CAPS Take 1 capsule (400 mg total) by mouth daily.   metoprolol tartrate (LOPRESSOR) 25 MG tablet TAKE ONE TABLET BY MOUTH EVERY MORNING AND TAKE ONE TABLET BY MOUTH EVERY DAY AT BEDTIME   nitroGLYCERIN (NITROSTAT) 0.4 MG SL tablet PLACE 1 TABLET UNDER THE TONGUE EVERY 5 MINUTES AS NEEDED FOR CHEST PAIN, MAY REPEAT FOR 3 DOSES.   Pedialyte (PEDIALYTE) SOLN Take by mouth daily. 6 oz   Propylhexedrine (BENZEDREX) INHA Place 1 each into the nose daily as needed (congestion).   rosuvastatin (CRESTOR) 10 MG tablet TAKE ONE TABLET BY MOUTH ONCE DAILY   sodium chloride (OCEAN) 0.65 % SOLN nasal spray Place 1 spray into both nostrils as needed for congestion.   Wheat Dextrin (BENEFIBER) POWD Take 1 Scoop by mouth daily as needed (constipation).   No facility-administered encounter medications on file as of 11/29/2021.    Allergies as of 11/29/2021   (No Known Allergies)    Physical Exam: Blood pressure 140/62, pulse 73, temperature 98 F (36.7 C), temperature source Oral,  height '5\' 9"'$  (1.753 m), weight 223 lb 6.4 oz (101.3 kg), SpO2 97 %. Gen:      No acute distress HEENT:  EOMI, sclera anicteric Neck:     No masses; no thyromegaly Lungs:    Clear to auscultation bilaterally; normal respiratory effort CV:         Regular rate and rhythm; no murmurs Abd:      + bowel sounds; soft, non-tender; no palpable masses, no distension Ext:    No edema; adequate peripheral perfusion Skin:      Warm and dry; no rash Neuro: alert and oriented x 3 Psych:  normal mood and affect   Data Reviewed: Imaging: CT chest 05/07/2013- mild emphysematous changes, compressive atelectasis in the right lower lobe.   CT high-resolution 04/23/2018- bronchial wall thickening with mild emphysema.  Patchy areas of peripheral septal thickening, mild groundglass.  Indeterminate for UIP. CT high-resolution 05/06/2020- mild emphysema, stable mild interstitial lung disease, indeterminate for UIP. CT high-resolution 02/16/2021-emphysema, stable interstitial lung disease and indeterminate pattern I have reviewed the images personally.  PFTs: 04/18/2018 FVC 3.43 [96%), FEV1 2.63 [105%], F/F 77, TLC 6.37 [95%], DLCO 17.94 [60%]  05/18/2020 FVC 3.39 [97%], FEV1 2.60 [108%], F/F 77, TLC 5.54 [83%], DLCO 16.87 [75%] Mild diffusion defect  Labs: Hypersensitivity panel 05/24/2018-negative CTD serologies-ANA negative, CCP less than 16, double-stranded DNA, Ro, La, SCL 70- Rheumatoid factor 14  Aldolase 5.3 CK 3 1 ANCA-negative  Myositis panel 07/12/2018-negative  Assessment:  Interstitial lung disease, pulmonary fibrosis Unclear etiology.  The fibrosis is indeterminate for UIP on CT scan and appears to be new compared to 2014.  Possibilities include hypersensitivity pneumonitis given his exposure to down pillow and comforter or IPF as he has a family history of pulmonary fibrosis.  He has gotten rid of his down pillows and comforters in 2020  ILD serologies reviewed with borderline rheumatoid factor which is nonspecific.    Discussed further work-up including lung biopsy.  He is opposed to surgical lung biopsy or bronchoscope due to risks involved.  I agree that at his age we can continue with conservative management and monitoring especially as his CT scan shows no progression of fibrosis  He is due to get a surveillance CT for his colon cancer in 3 months.  We will review this to reassess interstitial lung disease Order PFTs and follow-up in clinic in 6 months.  Stage  III colon cancer Status post colectomy and chemotherapy.  OSA Download reviewed on 11/29/2021.  He has good compliance and response to therapy.  Plan/Recommendations: - Continue CPAP - PFTs and follow-up in 6 months  Marshell Garfinkel MD [facility] 11/29/2021, 10:17 AM  CC: Virgie Dad, MD

## 2021-11-30 ENCOUNTER — Telehealth: Payer: Self-pay

## 2021-11-30 DIAGNOSIS — N528 Other male erectile dysfunction: Secondary | ICD-10-CM

## 2021-11-30 NOTE — Telephone Encounter (Signed)
Patient called and is requesting referral for Dr. Ernesta Amble that was previously discussed and have it faxed to his office at [phone] . I did not see one in the chart so requesting an new one. Routed to pcp

## 2021-11-30 NOTE — Addendum Note (Signed)
Addended by: Georgina Snell on: 11/30/2021 07:01 PM   Modules accepted: Orders

## 2021-11-30 NOTE — Telephone Encounter (Signed)
I have made referral to Dr Roni Bread in [facility] urology

## 2021-12-09 ENCOUNTER — Encounter: Payer: Self-pay | Admitting: Internal Medicine

## 2021-12-09 MED ORDER — NITROGLYCERIN 0.4 MG SL SUBL: SUBLINGUAL_TABLET | SUBLINGUAL | 10 refills | Status: DC

## 2021-12-09 NOTE — Progress Notes (Signed)
Remote pacemaker transmission.   

## 2021-12-13 DIAGNOSIS — N5201 Erectile dysfunction due to arterial insufficiency: Secondary | ICD-10-CM | POA: Diagnosis not present

## 2021-12-13 DIAGNOSIS — N500 Atrophy of testis: Secondary | ICD-10-CM | POA: Diagnosis not present

## 2021-12-16 ENCOUNTER — Other Ambulatory Visit: Payer: Self-pay | Admitting: Internal Medicine

## 2022-01-31 DIAGNOSIS — C44319 Basal cell carcinoma of skin of other parts of face: Secondary | ICD-10-CM | POA: Diagnosis not present

## 2022-01-31 DIAGNOSIS — L570 Actinic keratosis: Secondary | ICD-10-CM | POA: Diagnosis not present

## 2022-01-31 DIAGNOSIS — C4441 Basal cell carcinoma of skin of scalp and neck: Secondary | ICD-10-CM | POA: Diagnosis not present

## 2022-01-31 DIAGNOSIS — L821 Other seborrheic keratosis: Secondary | ICD-10-CM | POA: Diagnosis not present

## 2022-01-31 DIAGNOSIS — L738 Other specified follicular disorders: Secondary | ICD-10-CM | POA: Diagnosis not present

## 2022-01-31 DIAGNOSIS — Z85828 Personal history of other malignant neoplasm of skin: Secondary | ICD-10-CM | POA: Diagnosis not present

## 2022-01-31 DIAGNOSIS — L918 Other hypertrophic disorders of the skin: Secondary | ICD-10-CM | POA: Diagnosis not present

## 2022-01-31 DIAGNOSIS — D485 Neoplasm of uncertain behavior of skin: Secondary | ICD-10-CM | POA: Diagnosis not present

## 2022-01-31 DIAGNOSIS — D225 Melanocytic nevi of trunk: Secondary | ICD-10-CM | POA: Diagnosis not present

## 2022-02-14 ENCOUNTER — Ambulatory Visit (HOSPITAL_BASED_OUTPATIENT_CLINIC_OR_DEPARTMENT_OTHER)
Admission: RE | Admit: 2022-02-14 | Discharge: 2022-02-14 | Disposition: A | Discharge status: Home / Self Care | Payer: PPO | Source: Ambulatory Visit | Attending: Oncology | Admitting: Oncology

## 2022-02-14 ENCOUNTER — Inpatient Hospital Stay: Payer: PPO | Attending: Oncology

## 2022-02-14 DIAGNOSIS — N281 Cyst of kidney, acquired: Secondary | ICD-10-CM | POA: Diagnosis not present

## 2022-02-14 DIAGNOSIS — Z7901 Long term (current) use of anticoagulants: Secondary | ICD-10-CM | POA: Insufficient documentation

## 2022-02-14 DIAGNOSIS — C180 Malignant neoplasm of cecum: Secondary | ICD-10-CM | POA: Insufficient documentation

## 2022-02-14 DIAGNOSIS — C182 Malignant neoplasm of ascending colon: Secondary | ICD-10-CM | POA: Diagnosis not present

## 2022-02-14 DIAGNOSIS — R918 Other nonspecific abnormal finding of lung field: Secondary | ICD-10-CM | POA: Diagnosis not present

## 2022-02-14 DIAGNOSIS — D509 Iron deficiency anemia, unspecified: Secondary | ICD-10-CM | POA: Insufficient documentation

## 2022-02-14 DIAGNOSIS — C189 Malignant neoplasm of colon, unspecified: Secondary | ICD-10-CM | POA: Diagnosis not present

## 2022-02-14 DIAGNOSIS — I4891 Unspecified atrial fibrillation: Secondary | ICD-10-CM | POA: Insufficient documentation

## 2022-02-14 DIAGNOSIS — J439 Emphysema, unspecified: Secondary | ICD-10-CM | POA: Diagnosis not present

## 2022-02-14 DIAGNOSIS — I5032 Chronic diastolic (congestive) heart failure: Secondary | ICD-10-CM | POA: Insufficient documentation

## 2022-02-14 LAB — CEA (ACCESS): CEA (CHCC): 2.25 ng/mL (ref 0.00–5.00)

## 2022-02-14 LAB — BASIC METABOLIC PANEL - CANCER CENTER ONLY
Anion gap: 9 (ref 5–15)
BUN: 19 mg/dL (ref 8–23)
CO2: 28 mmol/L (ref 22–32)
Calcium: 9.6 mg/dL (ref 8.9–10.3)
Chloride: 104 mmol/L (ref 98–111)
Creatinine: 1.02 mg/dL (ref 0.61–1.24)
GFR, Estimated: >60 mL/min (ref >60)
Glucose, Bld: 107 mg/dL — ABNORMAL HIGH (ref 70–99)
Potassium: 5.1 mmol/L (ref 3.5–5.1)
Sodium: 141 mmol/L (ref 135–145)

## 2022-02-14 LAB — POCT I-STAT CREATININE: Creatinine, Ser: 1 mg/dL (ref 0.61–1.24)

## 2022-02-14 MED ORDER — IOHEXOL 300 MG/ML  SOLN
100.0000 mL | Freq: Once | INTRAMUSCULAR | Status: AC | PRN
  Administered 2022-02-14: 85 mL via INTRAVENOUS

## 2022-02-15 ENCOUNTER — Other Ambulatory Visit (HOSPITAL_BASED_OUTPATIENT_CLINIC_OR_DEPARTMENT_OTHER): Payer: Self-pay

## 2022-02-15 ENCOUNTER — Ambulatory Visit (INDEPENDENT_AMBULATORY_CARE_PROVIDER_SITE_OTHER): Payer: PPO

## 2022-02-15 ENCOUNTER — Telehealth: Payer: Self-pay

## 2022-02-15 DIAGNOSIS — I495 Sick sinus syndrome: Secondary | ICD-10-CM

## 2022-02-15 LAB — CUP PACEART REMOTE DEVICE CHECK
Battery Remaining Longevity: 47 mo
Battery Remaining Percentage: 41 %
Battery Voltage: 2.98 V
Brady Statistic AP VP Percent: 1.3 %
Brady Statistic AP VS Percent: 98 %
Brady Statistic AS VP Percent: 1 %
Brady Statistic AS VS Percent: 1 %
Brady Statistic RA Percent Paced: 57 %
Brady Statistic RV Percent Paced: 3.2 %
Date Time Interrogation Session: 20231003020012
Implantable Lead Implant Date: 20171002
Implantable Lead Implant Date: 20171002
Implantable Lead Location: 753859
Implantable Lead Location: 753860
Implantable Pulse Generator Implant Date: 20171002
Lead Channel Impedance Value: 390 Ohm
Lead Channel Impedance Value: 580 Ohm
Lead Channel Pacing Threshold Amplitude: 0.625 V
Lead Channel Pacing Threshold Amplitude: 0.75 V
Lead Channel Pacing Threshold Pulse Width: 0.5 ms
Lead Channel Pacing Threshold Pulse Width: 0.5 ms
Lead Channel Sensing Intrinsic Amplitude: 12 mV
Lead Channel Sensing Intrinsic Amplitude: 2.3 mV
Lead Channel Setting Pacing Amplitude: 1.625
Lead Channel Setting Pacing Amplitude: 2.5 V
Lead Channel Setting Pacing Pulse Width: 0.5 ms
Lead Channel Setting Sensing Sensitivity: 2 mV
Pulse Gen Model: 2272
Pulse Gen Serial Number: 7951215

## 2022-02-15 MED ORDER — INFLUENZA VAC A&B SA ADJ QUAD 0.5 ML IM PRSY
PREFILLED_SYRINGE | INTRAMUSCULAR | 0 refills | Status: DC
  Filled 2022-02-15: qty 0.5, 1d supply, fill #0

## 2022-02-15 MED ORDER — AREXVY 120 MCG/0.5ML IM SUSR
INTRAMUSCULAR | 0 refills | Status: DC
  Filled 2022-02-15: qty 0.5, 1d supply, fill #0

## 2022-02-15 NOTE — Telephone Encounter (Signed)
Pt is due for 6 month follow up with Dr. Caryl Comes.  Recent remote transmission: Scheduled remote reviewed. Normal device function.   Persistent AF/flutter since the end of August per compass, rates controlled Burden 41%, previous 17%,  Eliquis  Sent mychart message to Pt to schedule f/u.

## 2022-02-17 NOTE — Telephone Encounter (Signed)
Spoke with Pt.  He has not noticed that he has had increased atrial fibrillation.  He thinks that is because his heart rates have been controlled.  Follow up scheduled with Dr. Caryl Comes.  Discussed that his urologist would like to put him on a medication for erectile dysfunction but he was concerned that he could no longer take nitro.  He will discuss further with his urologist.  States he may just try the erectile dysfunction medication and see how he tolerates it.  Also asking about tikosyn and tier exception.  Advised would reach out to [place] regarding that.  All questions answered.  Pt states he is doing well right now, but he will call if that changes.

## 2022-02-18 ENCOUNTER — Inpatient Hospital Stay: Payer: PPO | Admitting: Oncology

## 2022-02-18 ENCOUNTER — Encounter: Payer: Self-pay | Admitting: Oncology

## 2022-02-18 ENCOUNTER — Telehealth: Payer: Self-pay | Admitting: *Deleted

## 2022-02-18 VITALS — BP 127/76 | HR 92 | Temp 98.1°F | Resp 18 | Ht 69.0 in | Wt 223.0 lb

## 2022-02-18 DIAGNOSIS — I5032 Chronic diastolic (congestive) heart failure: Secondary | ICD-10-CM | POA: Diagnosis not present

## 2022-02-18 DIAGNOSIS — C182 Malignant neoplasm of ascending colon: Secondary | ICD-10-CM | POA: Diagnosis not present

## 2022-02-18 DIAGNOSIS — Z7901 Long term (current) use of anticoagulants: Secondary | ICD-10-CM | POA: Diagnosis not present

## 2022-02-18 DIAGNOSIS — D509 Iron deficiency anemia, unspecified: Secondary | ICD-10-CM | POA: Diagnosis not present

## 2022-02-18 DIAGNOSIS — C180 Malignant neoplasm of cecum: Secondary | ICD-10-CM | POA: Diagnosis not present

## 2022-02-18 DIAGNOSIS — I4891 Unspecified atrial fibrillation: Secondary | ICD-10-CM | POA: Diagnosis not present

## 2022-02-18 NOTE — Progress Notes (Signed)
[place] OFFICE PROGRESS NOTE   Diagnosis: Colon cancer  INTERVAL HISTORY:   Donald [name] returns as scheduled.  He feels well.  Good appetite.  No difficulty with bowel function.  No bleeding.  Objective:  Vital signs in last 24 hours:  Blood pressure 127/76, pulse 92, temperature 98.1 F (36.7 C), temperature source Oral, resp. rate 18, height $RemoveBe'5\' 9"'MhkljHrGj$  (1.753 m), weight 223 lb (101.2 kg), SpO2 96 %.    Lymphatics: No cervical, supraclavicular, axillary, or inguinal nodes Resp: Lungs clear bilaterally Cardio: Regular rhythm with periods of several rapid beats GI: No hepatosplenomegaly, no mass, nontender Vascular: No leg edema  Lab Results:  Lab Results  Component Value Date   WBC 8.1 09/20/2021   HGB 14.2 09/20/2021   HCT 42.1 09/20/2021   MCV 104.7 (H) 09/20/2021   PLT 195 09/20/2021   NEUTROABS 5.8 09/20/2021    CMP  Lab Results  Component Value Date   NA 141 02/14/2022   K 5.1 02/14/2022   CL 104 02/14/2022   CO2 28 02/14/2022   GLUCOSE 107 (H) 02/14/2022   BUN 19 02/14/2022   CREATININE 1.00 02/14/2022   CALCIUM 9.6 02/14/2022   PROT 7.1 09/20/2021   ALBUMIN 4.1 09/20/2021   AST 19 09/20/2021   ALT 16 09/20/2021   ALKPHOS 62 09/20/2021   BILITOT 1.0 09/20/2021   GFRNONAA >60 02/14/2022   GFRAA >60 02/25/2019    Lab Results  Component Value Date   CEA 2.25 02/14/2022    Lab Results  Component Value Date   INR 1.52 06/08/2017   LABPROT 18.2 (H) 06/08/2017    Imaging:  CT CHEST ABDOMEN PELVIS W CONTRAST  Result Date: 02/15/2022 CLINICAL DATA:  Colon cancer surveillance, history of partial colectomy, additional history of pulmonary fibrosis * Tracking Code: BO * EXAM: CT CHEST, ABDOMEN, AND PELVIS WITH CONTRAST TECHNIQUE: Multidetector CT imaging of the chest, abdomen and pelvis was performed following the standard protocol during bolus administration of intravenous contrast. RADIATION DOSE REDUCTION: This exam was performed  according to the departmental dose-optimization program which includes automated exposure control, adjustment of the mA and/or kV according to patient size and/or use of iterative reconstruction technique. CONTRAST:  58mL OMNIPAQUE IOHEXOL 300 MG/ML SOLN additional oral enteric contrast COMPARISON:  CT abdomen pelvis, CT chest ILD protocol 02/16/2021 FINDINGS: CT CHEST FINDINGS Cardiovascular: Left chest multi lead pacer. Aortic atherosclerosis. Dense aortic valve calcifications. Normal heart size. Three-vessel coronary artery calcifications. No pericardial effusion. Mediastinum/Nodes: No enlarged mediastinal, hilar, or axillary lymph nodes. Benign, calcified granulomatous right hilar lymph nodes. Small hiatal hernia. Thyroid gland, trachea, and esophagus demonstrate no significant findings. Lungs/Pleura: Mild centrilobular and paraseptal emphysema. Mild pulmonary fibrosis in a pattern without clear apical to basal gradient, featuring irregular peripheral interstitial opacity and some interlobular septal thickening without evidence of traction bronchiectasis, subpleural bronchiolectasis, or honeycombing. No pleural effusion or pneumothorax. Musculoskeletal: No chest wall abnormality. No acute osseous findings. CT ABDOMEN PELVIS FINDINGS Hepatobiliary: No solid liver abnormality is seen. Benign cyst of the left and right liver, for which no further follow-up or characterization is required. No gallstones, gallbladder wall thickening, or biliary dilatation. Pancreas: Unremarkable. No pancreatic ductal dilatation or surrounding inflammatory changes. Spleen: Normal in size without significant abnormality. Adrenals/Urinary Tract: Adrenal glands are unremarkable. Simple, benign bilateral renal cortical cysts, for which no further follow-up or characterization is required. Kidneys are otherwise normal, without renal calculi, solid lesion, or hydronephrosis. Bladder is unremarkable. Stomach/Bowel: Stomach is within normal  limits.  Status post interval right hemicolectomy and ileocolic anastomosis. Sigmoid diverticulosis. No evidence of bowel wall thickening, distention, or inflammatory changes. Vascular/Lymphatic: Aortic atherosclerosis. No enlarged abdominal or pelvic lymph nodes. Reproductive: Prostatomegaly. Other: Small, fat containing left inguinal hernia.  No ascites. Musculoskeletal: No acute osseous findings. IMPRESSION: 1. Status post interval right hemicolectomy and ileocolic anastomosis. No evidence of lymphadenopathy or metastatic disease in the chest, abdomen, or pelvis. 2. Mild pulmonary fibrosis in a pattern without clear apical to basal gradient, featuring irregular peripheral interstitial opacity and some interlobular septal thickening without evidence of traction bronchiectasis, subpleural bronchiolectasis, or honeycombing. Findings remain indeterminate for UIP by ATS pulmonary fibrosis criteria, primary differential consideration remains NSIP. 3. Emphysema. 4. Coronary artery disease. 5. Aortic valve calcifications. Correlate for echocardiographic evidence of aortic valve dysfunction. Aortic Atherosclerosis (ICD10-I70.0) and Emphysema (ICD10-J43.9). Electronically Signed   By: Delanna Ahmadi M.D.   On: 02/15/2022 14:34   CUP PACEART REMOTE DEVICE CHECK  Result Date: 02/15/2022 Scheduled remote reviewed. Normal device function.  Persistent AF/flutter since the end of August per compass, rates controlled Burden 41%, previous 17%,  Eliquis Route to triage Next remote 91 days. LA   Medications: I have reviewed the patient's current medications.   Assessment/Plan: Colon cancer-stage III (T3N1), cecum mass biopsy 02/03/2021-adenocarcinoma CTs 02/16/2021-stable interstitial lung disease, mild COPD, cardiomegaly, CAD, multiple liver cysts with a few subcentimeter too small to characterize liver lesions, circumferential wall thickening at the ileocecal valve, right lower quadrant ileocolic lymph node measures 1.1  cm 03/24/2021, robotic assisted laparoscopic right colectomy-T3, 1/8 lymph nodes, 1 tumor deposit, lymphovascular invasion, mismatch repair protein expression intact, MSS Cycle 1 adjuvant Xeloda 04/21/2021 Cycle 2 adjuvant Xeloda 05/12/2021 Cycle 3 adjuvant Xeloda 06/07/2021 (5-day delay in start date due to foot discomfort, travel plans) Cycle 4 adjuvant Xeloda 06/29/2021, Xeloda dose reduced to 1000 mg twice daily for 14 days Cycle 5 adjuvant Xeloda 07/19/2021 Cycle 6 adjuvant Xeloda 08/09/2021 Cycle 7 adjuvant Xeloda 08/30/2021 Cycle 8 adjuvant Xeloda 09/20/2021 CTs 02/14/2022-no evidence of recurrent disease CAD Interstitial lung disease Iron deficiency anemia secondary to #1 Multiple colon polyps on the colonoscopy 02/03/2021-microscopic fragments of adenocarcinoma involving biopsies of transverse and ascending colon polyps felt to be contaminant from the cecum biopsy 2 tubular adenomas on the right colectomy specimen 03/24/2021 OSA Sick sinus syndrome-pacemaker Atrial fibrillation-maintained on apixaban Chronic diastolic CHF History of multiple skin cancers      Disposition: Mr. Cotten is in clinical remission from colon cancer.  He will return for an office visit and CEA in 6 months.  He will discuss the indication for a surveillance colonoscopy with Dr. Hilarie Fredrickson.  Betsy Coder, MD  02/18/2022  8:17 AM

## 2022-02-18 NOTE — Telephone Encounter (Signed)
-----   Message from Jerene Bears, MD sent at 02/18/2022  3:22 PM EDT ----- To his eliquis doc. Is fine  ----- Message ----- From: Larina Bras, CMA Sent: 02/18/2022   2:57 PM EDT To: Jerene Bears, MD  Do you just want me to send an eliquis clearance letter to physician or do you want him to have office visit first? ----- Message ----- From: Jerene Bears, MD Sent: 02/18/2022  11:31 AM EDT To: Larina Bras, CMA; Ladell Pier, MD  If he is as robust as usual, I think repeat colon makes sense. Do you think he would be ok with it? I can get it scheduled.  Dottie, Please reach out to Mr. Racz for surveillance colon for hx colon cancer JMP  ----- Message ----- From: Ladell Pier, MD Sent: 02/18/2022   8:35 AM EDT To: Jerene Bears, MD  He is 1 year out from the colon cancer diagnosis, should he have a surveillance colonoscopy?

## 2022-02-18 NOTE — Telephone Encounter (Signed)
[facility] will submit paperwork when tier exception is set to expire in 2024.

## 2022-02-18 NOTE — Telephone Encounter (Signed)
Request for surgical clearance:     Endoscopy Procedure  What type of surgery is being performed?     colonoscopy  When is this surgery scheduled?     TBD  What type of clearance is required ?   Pharmacy  Are there any medications that need to be held prior to surgery and how long? Eliquis, 2 days  Practice name and name of physician performing surgery?      [facility]  What is your office phone and fax number?      Phone- [phone]  [phone]  Anesthesia type (None, local, MAC, general) ?       MAC

## 2022-02-24 ENCOUNTER — Other Ambulatory Visit: Payer: Self-pay | Admitting: Internal Medicine

## 2022-02-24 ENCOUNTER — Other Ambulatory Visit (HOSPITAL_BASED_OUTPATIENT_CLINIC_OR_DEPARTMENT_OTHER): Payer: Self-pay

## 2022-02-24 MED ORDER — COVID-19 MRNA 2023-2024 VACCINE (COMIRNATY) 0.3 ML INJECTION
INTRAMUSCULAR | 0 refills | Status: DC
  Filled 2022-02-24: qty 0.3, 1d supply, fill #0

## 2022-02-24 NOTE — Telephone Encounter (Signed)
Patient with diagnosis of A Fib on Eliquis for anticoagulation.    Procedure: colonoscopy Date of procedure: TBD   CHA2DS2-VASc Score = 5  This indicates a 7.2% annual risk of stroke. The patient's score is based upon: CHF History: 1 HTN History: 1 Diabetes History: 0 Stroke History: 0 Vascular Disease History: 1 Age Score: 2 Gender Score: 0   CrCl 74 mL/min Platelet count 195K  Per office protocol, patient can hold Eliquis for 2 days prior to procedure.     **This guidance is not considered finalized until pre-operative APP has relayed final recommendations.**

## 2022-02-24 NOTE — Telephone Encounter (Signed)
   Patient Name: Donald [name]  DOB: 1932-12-14 MRN: [id number]  Primary Cardiologist: Virl Axe, MD  Chart reviewed as part of pre-operative protocol coverage. Pre-op clearance already addressed by colleagues in earlier phone notes. To summarize recommendations:  -Per pharmacy team, patient may hold Eliquis x2 days prior to the procedure.  Please restart when medically safe to do so.  Medical clearance not requested.  Will route this bundled recommendation to requesting provider via Epic fax function and remove from pre-op pool. Please call with questions.  Elgie Collard, PA-C 02/24/2022, 4:30 PM

## 2022-02-25 NOTE — Telephone Encounter (Signed)
I have spoken to patient and have scheduled a colonoscopy appointment in [facility] [place] for 04/14/22 at 230 pm and a previsit on 03/22/22 at 1030 am. He has also been made aware that he has been given clearance to hold Eliquis 2 days prior to his upcoming procedure. Patient verbalizes understanding and is in agreement with plan.

## 2022-02-28 NOTE — Progress Notes (Signed)
Remote pacemaker transmission.   

## 2022-03-10 ENCOUNTER — Telehealth: Payer: Self-pay | Admitting: *Deleted

## 2022-03-10 NOTE — Telephone Encounter (Signed)
Noted  

## 2022-03-10 NOTE — Telephone Encounter (Signed)
88 year old male with history of colon cancer diagnosed on colonoscopy 01/2021, now s/p robotic assisted laparoscopic right colectomy with 1/8 lymph nodes positive, 1 tumor deposit, lymphovascular invasion.  Subsequently treated with Xeloda.  Most recent CT on 02/14/2022 without evidence of recurrence.  History of iron deficiency anemia related to the above malignancy.  Colonoscopy 2022 also with several adenomatous polyps.  History of A-fib, treated with apixaban.  Patient needs repeat colonoscopy now for posttreatment surveillance.  I have no issue with scheduling directly to [facility] for colonoscopy.  Procedures last year were scheduled with me to assist in expediting his work-up due to availability.  Otherwise, patient follows with Dr. Hilarie Fredrickson and is scheduled with Dr. Hilarie Fredrickson for repeat colonoscopy next month. Therefore, I will attach him to this message for his review.

## 2022-03-10 NOTE — Telephone Encounter (Signed)
Pt. Scheduled for colonoscopy on 04/14/22 with DR. Pyrtle, I see you did procedure last year,pt. Is on Eliquis and they do have a hold for that please review and advise if pt. Need OV or is to keep scheduled procedure with Dr. Hilarie Fredrickson?

## 2022-03-10 NOTE — Telephone Encounter (Signed)
I had been in communication with the patient's oncologist who recommended he proceed with surveillance colonoscopy I am in agreement with that recommendation We need get permission from prescribing provider to hold Eliquis but I do not feel that patient needs to be seen prior to this unless there is an issue with the Eliquis hold. We asked for a 48-hour hold prior to procedure Thanks for your help with this

## 2022-03-17 ENCOUNTER — Telehealth: Payer: Self-pay

## 2022-03-17 NOTE — Telephone Encounter (Signed)
Spoke with pt who reports activity intolerance and SOB with exertion.  Pt is open to appointment in Afib clinic for further evaluation for cardioversion.   Will forward to Afib clinic staff to contact pt for assistance with scheduling.  Reviewed ED precautions.  Pt verbalizes understanding and agrees with current plan.

## 2022-03-17 NOTE — Telephone Encounter (Signed)
-----   Message from Deboraha Sprang, MD sent at 03/12/2022  8:14 PM EDT ----- Remote reviewed. This remote is abnormal for persistent afib Rosann Auerbach can you call hiim and ask him if he has noted any change in exerecise tolerance since end of august >> in any case we should offer cardioversion Thanks SK

## 2022-03-18 NOTE — Telephone Encounter (Signed)
Appt made

## 2022-03-21 ENCOUNTER — Encounter (HOSPITAL_COMMUNITY): Payer: Self-pay | Admitting: Physician Assistant

## 2022-03-21 ENCOUNTER — Other Ambulatory Visit (HOSPITAL_COMMUNITY): Payer: Self-pay

## 2022-03-21 ENCOUNTER — Ambulatory Visit (HOSPITAL_COMMUNITY)
Admission: RE | Admit: 2022-03-21 | Discharge: 2022-03-21 | Disposition: A | Discharge status: Home / Self Care | Payer: PPO | Source: Ambulatory Visit | Attending: Physician Assistant | Admitting: Physician Assistant

## 2022-03-21 ENCOUNTER — Ambulatory Visit (AMBULATORY_SURGERY_CENTER): Payer: Self-pay | Admitting: *Deleted

## 2022-03-21 VITALS — BP 134/80 | HR 86 | Ht 69.0 in | Wt 225.6 lb

## 2022-03-21 VITALS — Ht 69.0 in | Wt 226.2 lb

## 2022-03-21 DIAGNOSIS — I4892 Unspecified atrial flutter: Secondary | ICD-10-CM

## 2022-03-21 DIAGNOSIS — Z87891 Personal history of nicotine dependence: Secondary | ICD-10-CM | POA: Diagnosis not present

## 2022-03-21 DIAGNOSIS — Z8249 Family history of ischemic heart disease and other diseases of the circulatory system: Secondary | ICD-10-CM | POA: Insufficient documentation

## 2022-03-21 DIAGNOSIS — I4819 Other persistent atrial fibrillation: Secondary | ICD-10-CM | POA: Diagnosis not present

## 2022-03-21 DIAGNOSIS — Z7901 Long term (current) use of anticoagulants: Secondary | ICD-10-CM | POA: Diagnosis not present

## 2022-03-21 DIAGNOSIS — D6869 Other thrombophilia: Secondary | ICD-10-CM

## 2022-03-21 DIAGNOSIS — Z79899 Other long term (current) drug therapy: Secondary | ICD-10-CM | POA: Diagnosis not present

## 2022-03-21 DIAGNOSIS — Z85038 Personal history of other malignant neoplasm of large intestine: Secondary | ICD-10-CM

## 2022-03-21 DIAGNOSIS — I10 Essential (primary) hypertension: Secondary | ICD-10-CM | POA: Diagnosis not present

## 2022-03-21 MED ORDER — NA SULFATE-K SULFATE-MG SULF 17.5-3.13-1.6 GM/177ML PO SOLN
1.0000 | Freq: Once | ORAL | 0 refills | Status: AC
  Filled 2022-03-21: qty 354, 2d supply, fill #0

## 2022-03-21 NOTE — Progress Notes (Signed)
Primary Care Physician: Virgie Dad, MD Referring Physician:Dr. Quindell Shere is a 88 y.o. male with a h/o afib that is on [place]. He has done well on tikosn with low afib burden. He recently had 12 hours of afib, and is in SR today. He feels well . He wanted to discuss with me when to get excited about being in afib andhow to  manage going forward. He continues on eliquis 5 mg bid with a CHA2DS2VASc score of at least 4.   Follow up in the AF clinic 03/21/22. Patient notified by device clinic for ongoing afib starting at the end of September. He has noticed increased fatigue with exertion over the past two weeks. There were no triggers for his afib that he could identify. No bleeding issues on anticoagulation. Of note, he has an upcoming colonoscopy for one year cancer screening follow up.   Today, he denies symptoms of palpitations, chest pain, shortness of breath, orthopnea, PND, lower extremity edema, dizziness, presyncope, syncope, or neurologic sequela. The patient is tolerating medications without difficulties and is otherwise without complaint today.   Past Medical History:  Diagnosis Date   Abnormal CT scan, stomach 02/2014   Thickening of gastric fundus and cardia.  gastritis on EGD 03/2014   Allergic rhinitis due to pollen    Anemia 12/2020   Arthritis    joints,finger   Arthropathy, unspecified, site unspecified    Atherosclerosis    a. Noted by abdominal CT 02/2014 (h/o normal nuc 2011).   Atrial flutter ([place]) 02/24/2014   s/p ablation 11/15   Blood transfusion without reported diagnosis    Cardiomyopathy ([place])    tachy mediated - a. TEE (10/15):  EF 30%;  b. Echo after NSR restored (10/15):  mild LVH, EF 55-60%, mild AS, mild AI, mild MR, mild to mod LAE, mild RAE   Cataract    bilateral,removed replaced with implants   CHF (congestive heart failure) ([place]) 01/04/2021   COVID-19 11/2020   Diverticulosis    severe in descending, sigmoid colon.    ED  (erectile dysfunction)    Emphysema of lung ([place])    Esophageal stricture 03/2014   traversable with endoscope. not dilated.    Gastric AVM    GERD (gastroesophageal reflux disease) 03/2014   small HH and gastritis on EGD   GI bleed    Habitual alcohol use    Hemorrhoids    Hiatal hernia    Hyperlipidemia    Hypertension    Obesity    OSA on CPAP    PAF (paroxysmal atrial fibrillation) ([place])    Pneumonia 12/2020   mild with covid   Presence of permanent cardiac pacemaker    Prostatitis, unspecified    Pulmonary fibrosis ([facility])    Sinus bradycardia    a. s/p STJ dual chamber PPM   Skin cancer    basal and squamous cell   Sleep apnea    Substance abuse ([place])    etoh   Past Surgical History:  Procedure Laterality Date   ATRIAL FLUTTER ABLATION N/A 03/19/2014   RFCA of atrial flutter by Dr Caryl Comes   CARDIOVERSION N/A 02/24/2014   Procedure: CARDIOVERSION;  Surgeon: Lelon Perla, MD;  Location: [facility];  Service: Cardiovascular;  Laterality: N/A;   CARDIOVERSION Right 09/10/2014   Procedure: CARDIOVERSION;  Surgeon: Deboraha Sprang, MD;  Location: [facility] CATH LAB;  Service: Cardiovascular;  Laterality: Right;   CARDIOVERSION N/A 01/22/2018   Procedure: CARDIOVERSION;  Surgeon: Fay Records, MD;  Location: [facility] ENDOSCOPY;  Service: Cardiovascular;  Laterality: N/A;   COLONOSCOPY N/A 04/23/2015   Procedure: COLONOSCOPY;  Surgeon: Ladene Artist, MD;  Location: [facility] ENDOSCOPY;  Service: Endoscopy;  Laterality: N/A;   COLONOSCOPY     ELBOW SURGERY Right    SCREW PLACED   ENTEROSCOPY N/A 06/12/2015   Procedure: ENTEROSCOPY;  Surgeon: Ladene Artist, MD;  Location: [facility] ENDOSCOPY;  Service: Endoscopy;  Laterality: N/A;   EP IMPLANTABLE DEVICE N/A 02/15/2016   Procedure: Pacemaker Implant;  Surgeon: Evans Lance, MD;  Location: [facility] CV LAB;  Service: Cardiovascular;  Laterality: N/A;   ESOPHAGOGASTRODUODENOSCOPY N/A 04/09/2014   Procedure: ESOPHAGOGASTRODUODENOSCOPY (EGD);   Surgeon: Inda Castle, MD;  Location: [facility];  Service: Endoscopy;  Laterality: N/A;   ESOPHAGOGASTRODUODENOSCOPY N/A 04/23/2015   Procedure: ESOPHAGOGASTRODUODENOSCOPY (EGD);  Surgeon: Ladene Artist, MD;  Location: [facility] ENDOSCOPY;  Service: Endoscopy;  Laterality: N/A;   INGUINAL HERNIA REPAIR     right   INGUINAL HERNIA REPAIR     left   LEFT HEART CATH AND CORONARY ANGIOGRAPHY N/A 03/16/2017   Procedure: LEFT HEART CATH AND CORONARY ANGIOGRAPHY;  Surgeon: Burnell Blanks, MD;  Location: [facility] CV LAB;  Service: Cardiovascular;  Laterality: N/A;   ORIF fracture of the elbow  1992   SKIN CANCER EXCISION  05/21/2012   Squamous cell ca   TEE WITHOUT CARDIOVERSION N/A 02/24/2014   Procedure: TRANSESOPHAGEAL ECHOCARDIOGRAM (TEE);  Surgeon: Lelon Perla, MD;  Location: [facility] ENDOSCOPY;  Service: Cardiovascular;  Laterality: N/A;   TONSILLECTOMY     AGE 64   UPPER GASTROINTESTINAL ENDOSCOPY      Current Outpatient Medications  Medication Sig Dispense Refill   acetaminophen (TYLENOL) 500 MG tablet Take 500 mg by mouth every 6 (six) hours as needed for moderate pain or headache.     albuterol (VENTOLIN HFA) 108 (90 Base) MCG/ACT inhaler Inhale TWO puffs into THE lungs every SIX hours as needed FOR wheezing OR shortness of breath 8.5 g 3   apixaban (ELIQUIS) 5 MG TABS tablet TAKE ONE TABLET BY MOUTH EVERY MORNING AND TAKE ONE TABLET BY MOUTH EVERY DAY AT BEDTIME 60 tablet 5   carboxymethylcellulose (REFRESH PLUS) 0.5 % SOLN Place 1 drop into both eyes daily as needed (dry eyes).     COVID-19 mRNA vaccine 2023-2024 (COMIRNATY) SUSP injection Inject into the muscle. 0.3 mL 0   diltiazem (CARDIZEM CD) 120 MG 24 hr capsule TAKE ONE CAPSULE BY MOUTH AT BREAKFAST AND AT BEDTIME 180 capsule 3   dofetilide (TIKOSYN) 500 MCG capsule TAKE 1 CAPSULE BY MOUTH EVERY MORNING AND 1 CAPSULE AT BEDTIME 180 capsule 2   furosemide (LASIX) 20 MG tablet TAKE ONE TABLET BY MOUTH ONCE DAILY 90  tablet 2   influenza vaccine adjuvanted (FLUAD) 0.5 ML injection Inject into the muscle. 0.5 mL 0   Magnesium Oxide 400 MG CAPS Take 1 capsule (400 mg total) by mouth daily. 30 capsule 6   metoprolol tartrate (LOPRESSOR) 25 MG tablet TAKE ONE TABLET BY MOUTH EVERY MORNING AND TAKE ONE TABLET BY MOUTH EVERY DAY AT BEDTIME 60 tablet 11   Na Sulfate-K Sulfate-Mg Sulf 17.5-3.13-1.6 GM/177ML SOLN Take 1 kit by mouth once for 1 dose. 354 mL 0   Pedialyte (PEDIALYTE) SOLN Take by mouth daily. 6 oz     Propylhexedrine (BENZEDREX) INHA Place 1 each into the nose daily as needed (congestion).     rosuvastatin (CRESTOR) 10 MG  tablet TAKE ONE TABLET BY MOUTH ONCE DAILY 90 tablet 3   RSV vaccine recomb adjuvanted (AREXVY) 120 MCG/0.5ML injection Inject into the muscle. 0.5 mL 0   sildenafil (REVATIO) 20 MG tablet as needed.     sodium chloride (OCEAN) 0.65 % SOLN nasal spray Place 1 spray into both nostrils as needed for congestion.     Wheat Dextrin (BENEFIBER) POWD Take 1 Scoop by mouth daily as needed (constipation).     No current facility-administered medications for this encounter.    No Known Allergies  Social History   Socioeconomic History   Marital status: Married    Spouse name: Not on file   Number of children: 3   Years of education: 16   Highest education level: Not on file  Occupational History   Occupation: Land    Employer: RETIRED  Tobacco Use   Smoking status: Former    Packs/day: 0.00    Years: 40.00    Total pack years: 0.00    Types: Cigarettes, Cigars    Quit date: 09/18/2008    Years since quitting: 13.5    Passive exposure: Past   Smokeless tobacco: Former    Types: Chew    Quit date: 2005   Tobacco comments:    Former smoker 03/21/22  Vaping Use   Vaping Use: Never used  Substance and Sexual Activity   Alcohol use: Yes    Alcohol/week: 14.0 standard drinks of alcohol    Types: 14 Standard drinks or equivalent per week    Comment: 2 drinks  nightly 03/21/22   Drug use: No   Sexual activity: Not Currently  Other Topics Concern   Not on file  Social History Narrative   HSG, [employer]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 married 1961. 2 sons- '64, '63 , I daughter- '66, 4 grandchildren. work: Museum/gallery curator, retired but still consults. 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      No new stressors/       Consumption of caffeine-Coffee      Exercise- 30 mins swimming daily   Social Determinants of Health   Financial Resource Strain: Low Risk  (04/28/2020)   Overall Financial Resource Strain (CARDIA)    Difficulty of Paying Living Expenses: Not hard at all  Food Insecurity: No Food Insecurity (04/28/2020)   Hunger Vital Sign    Worried About Running Out of Food in the Last Year: Never true    Ran Out of Food in the Last Year: Never true  Transportation Needs: No Transportation Needs (04/28/2020)   PRAPARE - Hydrologist (Medical): No    Lack of Transportation (Non-Medical): No  Physical Activity: Sufficiently Active (04/28/2020)   Exercise Vital Sign    Days of Exercise per Week: 5 days    Minutes of Exercise per Session: 30 min  Stress: No Stress Concern Present (04/28/2020)   [place]    Feeling of Stress : Not at all  Social Connections: [place] (04/28/2020)   Social Connection and Isolation Panel [NHANES]    Frequency of Communication with Friends and Family: More than three times a week    Frequency of Social Gatherings with Friends and Family: More than three times a week    Attends Religious Services: More than 4 times per year    Active Member of [employer] or Organizations: Yes  Attends Archivist Meetings: More than 4 times per year    Marital Status: Married  Human resources officer Violence: Not At Risk (02/27/2018)    Humiliation, Afraid, Rape, and Kick questionnaire    Fear of Current or Ex-Partner: No    Emotionally Abused: No    Physically Abused: No    Sexually Abused: No    Family History  Problem Relation Age of Onset   Cancer Mother        ovarian   Other Father        renal disease- grief over loss of spouse   Cancer Brother        prostate- died of hematologic disorder 2nd to chemo   Pulmonary fibrosis Brother    Diabetes Neg Hx    Coronary artery disease Neg Hx    Colon cancer Neg Hx    Stomach cancer Neg Hx    Rectal cancer Neg Hx    Colon polyps Neg Hx    Crohn's disease Neg Hx    Esophageal cancer Neg Hx    Ulcerative colitis Neg Hx     ROS- All systems are reviewed and negative except as per the HPI above  Physical Exam: Vitals:   03/21/22 1503  BP: 134/80  Pulse: 86  Weight: 102.3 kg  Height: _0  (1.753 m)   Wt Readings from Last 3 Encounters:  03/21/22 102.3 kg  03/21/22 102.6 kg  02/18/22 101.2 kg    Labs: Lab Results  Component Value Date   NA 141 02/14/2022   K 5.1 02/14/2022   CL 104 02/14/2022   CO2 28 02/14/2022   GLUCOSE 107 (H) 02/14/2022   BUN 19 02/14/2022   CREATININE 1.00 02/14/2022   CALCIUM 9.6 02/14/2022   PHOS 2.7 02/14/2016   MG 1.7 09/20/2021   Lab Results  Component Value Date   INR 1.52 06/08/2017   Lab Results  Component Value Date   CHOL 98 01/07/2021   HDL 38 01/07/2021   LDLCALC 51 01/07/2021   TRIG 50 01/07/2021    GEN- The patient is a well appearing elderly male, alert and oriented x 3 today.   HEENT-head normocephalic, atraumatic, sclera clear, conjunctiva pink, hearing intact, trachea midline. Lungs- Clear to ausculation bilaterally, normal work of breathing Heart- Regular rate and rhythm, no murmurs, rubs or gallops  GI- soft, NT, ND, + BS Extremities- no clubbing, cyanosis, or edema MS- no significant deformity or atrophy Skin- no rash or lesion Psych- euthymic mood, full affect Neuro- strength and  sensation are intact   EKG- SR with short PR vs atypical atrial flutter with 2:1 block Vent. rate 86 BPM PR interval 98 ms QRS duration 86 ms QT/QTcB 382/457 ms   Assessment and Plan: 1. Persistent Afib  Patient appears to have been in persistent atrial fibrillation since the end of September.  ECG difficult to see if he is in SR or a slow 2:1 atrial flutter. Will have him send a manual transmission from home to confirm his rhythm. If in atrial flutter, will arrange for DCCV. Will need to discuss timing given his upcoming colonoscopy.  Continue dofetilide 500 mcg BID Continue metoprolol 25 mg BID Continue diltiazem 120 mg daily with 30 mg PRN q 4 hours for heart racing.  2. CHA2DS2VASc score of 4 Continue eliquis 5 mg BID  3. PPM Followed by Dr Caryl Comes and the device clinic.  4. HTN Stable, no changes today.   Follow up with Dr Caryl Comes as scheduled.  Sooner in AF clinic pending device interrogation.    [facility] [address] [place], [place] [address] [phone]

## 2022-03-21 NOTE — H&P (View-Only) (Signed)
Primary Care Physician: Virgie Dad, MD Referring Physician:Dr. Quindell Shere is a 88 y.o. male with a h/o afib that is on [place]. He has done well on tikosn with low afib burden. He recently had 12 hours of afib, and is in SR today. He feels well . He wanted to discuss with me when to get excited about being in afib andhow to  manage going forward. He continues on eliquis 5 mg bid with a CHA2DS2VASc score of at least 4.   Follow up in the AF clinic 03/21/22. Patient notified by device clinic for ongoing afib starting at the end of September. He has noticed increased fatigue with exertion over the past two weeks. There were no triggers for his afib that he could identify. No bleeding issues on anticoagulation. Of note, he has an upcoming colonoscopy for one year cancer screening follow up.   Today, he denies symptoms of palpitations, chest pain, shortness of breath, orthopnea, PND, lower extremity edema, dizziness, presyncope, syncope, or neurologic sequela. The patient is tolerating medications without difficulties and is otherwise without complaint today.   Past Medical History:  Diagnosis Date   Abnormal CT scan, stomach 02/2014   Thickening of gastric fundus and cardia.  gastritis on EGD 03/2014   Allergic rhinitis due to pollen    Anemia 12/2020   Arthritis    joints,finger   Arthropathy, unspecified, site unspecified    Atherosclerosis    a. Noted by abdominal CT 02/2014 (h/o normal nuc 2011).   Atrial flutter ([facility]) 02/24/2014   s/p ablation 11/15   Blood transfusion without reported diagnosis    Cardiomyopathy ([place])    tachy mediated - a. TEE (10/15):  EF 30%;  b. Echo after NSR restored (10/15):  mild LVH, EF 55-60%, mild AS, mild AI, mild MR, mild to mod LAE, mild RAE   Cataract    bilateral,removed replaced with implants   CHF (congestive heart failure) ([place]) 01/04/2021   COVID-19 11/2020   Diverticulosis    severe in descending, sigmoid colon.    ED  (erectile dysfunction)    Emphysema of lung ([place])    Esophageal stricture 03/2014   traversable with endoscope. not dilated.    Gastric AVM    GERD (gastroesophageal reflux disease) 03/2014   small HH and gastritis on EGD   GI bleed    Habitual alcohol use    Hemorrhoids    Hiatal hernia    Hyperlipidemia    Hypertension    Obesity    OSA on CPAP    PAF (paroxysmal atrial fibrillation) ([place])    Pneumonia 12/2020   mild with covid   Presence of permanent cardiac pacemaker    Prostatitis, unspecified    Pulmonary fibrosis ([facility])    Sinus bradycardia    a. s/p STJ dual chamber PPM   Skin cancer    basal and squamous cell   Sleep apnea    Substance abuse ([place])    etoh   Past Surgical History:  Procedure Laterality Date   ATRIAL FLUTTER ABLATION N/A 03/19/2014   RFCA of atrial flutter by Dr Caryl Comes   CARDIOVERSION N/A 02/24/2014   Procedure: CARDIOVERSION;  Surgeon: Lelon Perla, MD;  Location: [facility];  Service: Cardiovascular;  Laterality: N/A;   CARDIOVERSION Right 09/10/2014   Procedure: CARDIOVERSION;  Surgeon: Deboraha Sprang, MD;  Location: [facility] CATH LAB;  Service: Cardiovascular;  Laterality: Right;   CARDIOVERSION N/A 01/22/2018   Procedure: CARDIOVERSION;  Surgeon: Fay Records, MD;  Location: [facility] ENDOSCOPY;  Service: Cardiovascular;  Laterality: N/A;   COLONOSCOPY N/A 04/23/2015   Procedure: COLONOSCOPY;  Surgeon: Ladene Artist, MD;  Location: [facility] ENDOSCOPY;  Service: Endoscopy;  Laterality: N/A;   COLONOSCOPY     ELBOW SURGERY Right    SCREW PLACED   ENTEROSCOPY N/A 06/12/2015   Procedure: ENTEROSCOPY;  Surgeon: Ladene Artist, MD;  Location: [facility] ENDOSCOPY;  Service: Endoscopy;  Laterality: N/A;   EP IMPLANTABLE DEVICE N/A 02/15/2016   Procedure: Pacemaker Implant;  Surgeon: Evans Lance, MD;  Location: [facility] CV LAB;  Service: Cardiovascular;  Laterality: N/A;   ESOPHAGOGASTRODUODENOSCOPY N/A 04/09/2014   Procedure: ESOPHAGOGASTRODUODENOSCOPY (EGD);   Surgeon: Inda Castle, MD;  Location: [facility];  Service: Endoscopy;  Laterality: N/A;   ESOPHAGOGASTRODUODENOSCOPY N/A 04/23/2015   Procedure: ESOPHAGOGASTRODUODENOSCOPY (EGD);  Surgeon: Ladene Artist, MD;  Location: [facility] ENDOSCOPY;  Service: Endoscopy;  Laterality: N/A;   INGUINAL HERNIA REPAIR     right   INGUINAL HERNIA REPAIR     left   LEFT HEART CATH AND CORONARY ANGIOGRAPHY N/A 03/16/2017   Procedure: LEFT HEART CATH AND CORONARY ANGIOGRAPHY;  Surgeon: Burnell Blanks, MD;  Location: [facility] CV LAB;  Service: Cardiovascular;  Laterality: N/A;   ORIF fracture of the elbow  1992   SKIN CANCER EXCISION  05/21/2012   Squamous cell ca   TEE WITHOUT CARDIOVERSION N/A 02/24/2014   Procedure: TRANSESOPHAGEAL ECHOCARDIOGRAM (TEE);  Surgeon: Lelon Perla, MD;  Location[facility] ENDOSCOPY;  Service: Cardiovascular;  Laterality: N/A;   TONSILLECTOMY     AGE 46   UPPER GASTROINTESTINAL ENDOSCOPY      Current Outpatient Medications  Medication Sig Dispense Refill   acetaminophen (TYLENOL) 500 MG tablet Take 500 mg by mouth every 6 (six) hours as needed for moderate pain or headache.     albuterol (VENTOLIN HFA) 108 (90 Base) MCG/ACT inhaler Inhale TWO puffs into THE lungs every SIX hours as needed FOR wheezing OR shortness of breath 8.5 g 3   apixaban (ELIQUIS) 5 MG TABS tablet TAKE ONE TABLET BY MOUTH EVERY MORNING AND TAKE ONE TABLET BY MOUTH EVERY DAY AT BEDTIME 60 tablet 5   carboxymethylcellulose (REFRESH PLUS) 0.5 % SOLN Place 1 drop into both eyes daily as needed (dry eyes).     COVID-19 mRNA vaccine 2023-2024 (COMIRNATY) SUSP injection Inject into the muscle. 0.3 mL 0   diltiazem (CARDIZEM CD) 120 MG 24 hr capsule TAKE ONE CAPSULE BY MOUTH AT BREAKFAST AND AT BEDTIME 180 capsule 3   dofetilide (TIKOSYN) 500 MCG capsule TAKE 1 CAPSULE BY MOUTH EVERY MORNING AND 1 CAPSULE AT BEDTIME 180 capsule 2   furosemide (LASIX) 20 MG tablet TAKE ONE TABLET BY MOUTH ONCE DAILY 90  tablet 2   influenza vaccine adjuvanted (FLUAD) 0.5 ML injection Inject into the muscle. 0.5 mL 0   Magnesium Oxide 400 MG CAPS Take 1 capsule (400 mg total) by mouth daily. 30 capsule 6   metoprolol tartrate (LOPRESSOR) 25 MG tablet TAKE ONE TABLET BY MOUTH EVERY MORNING AND TAKE ONE TABLET BY MOUTH EVERY DAY AT BEDTIME 60 tablet 11   Na Sulfate-K Sulfate-Mg Sulf 17.5-3.13-1.6 GM/177ML SOLN Take 1 kit by mouth once for 1 dose. 354 mL 0   Pedialyte (PEDIALYTE) SOLN Take by mouth daily. 6 oz     Propylhexedrine (BENZEDREX) INHA Place 1 each into the nose daily as needed (congestion).     rosuvastatin (CRESTOR) 10 MG  tablet TAKE ONE TABLET BY MOUTH ONCE DAILY 90 tablet 3   RSV vaccine recomb adjuvanted (AREXVY) 120 MCG/0.5ML injection Inject into the muscle. 0.5 mL 0   sildenafil (REVATIO) 20 MG tablet as needed.     sodium chloride (OCEAN) 0.65 % SOLN nasal spray Place 1 spray into both nostrils as needed for congestion.     Wheat Dextrin (BENEFIBER) POWD Take 1 Scoop by mouth daily as needed (constipation).     No current facility-administered medications for this encounter.    No Known Allergies  Social History   Socioeconomic History   Marital status: Married    Spouse name: Not on file   Number of children: 3   Years of education: 16   Highest education level: Not on file  Occupational History   Occupation: Land    Employer: RETIRED  Tobacco Use   Smoking status: Former    Packs/day: 0.00    Years: 40.00    Total pack years: 0.00    Types: Cigarettes, Cigars    Quit date: 09/18/2008    Years since quitting: 13.5    Passive exposure: Past   Smokeless tobacco: Former    Types: Chew    Quit date: 2005   Tobacco comments:    Former smoker 03/21/22  Vaping Use   Vaping Use: Never used  Substance and Sexual Activity   Alcohol use: Yes    Alcohol/week: 14.0 standard drinks of alcohol    Types: 14 Standard drinks or equivalent per week    Comment: 2 drinks  nightly 03/21/22   Drug use: No   Sexual activity: Not Currently  Other Topics Concern   Not on file  Social History Narrative   HSG, [place]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 married 1961. 2 sons- '64, '63 , I daughter- '66, 4 grandchildren. work: Museum/gallery curator, retired but still consults. 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      No new stressors/       Consumption of caffeine-Coffee      Exercise- 30 mins swimming daily   Social Determinants of Health   Financial Resource Strain: Low Risk  (04/28/2020)   Overall Financial Resource Strain (CARDIA)    Difficulty of Paying Living Expenses: Not hard at all  Food Insecurity: No Food Insecurity (04/28/2020)   Hunger Vital Sign    Worried About Running Out of Food in the Last Year: Never true    Ran Out of Food in the Last Year: Never true  Transportation Needs: No Transportation Needs (04/28/2020)   PRAPARE - Hydrologist (Medical): No    Lack of Transportation (Non-Medical): No  Physical Activity: Sufficiently Active (04/28/2020)   Exercise Vital Sign    Days of Exercise per Week: 5 days    Minutes of Exercise per Session: 30 min  Stress: No Stress Concern Present (04/28/2020)   [place]    Feeling of Stress : Not at all  Social Connections: [place] (04/28/2020)   Social Connection and Isolation Panel [NHANES]    Frequency of Communication with Friends and Family: More than three times a week    Frequency of Social Gatherings with Friends and Family: More than three times a week    Attends Religious Services: More than 4 times per year    Active Member of [employer] or Organizations: Yes  Attends Archivist Meetings: More than 4 times per year    Marital Status: Married  Human resources officer Violence: Not At Risk (02/27/2018)    Humiliation, Afraid, Rape, and Kick questionnaire    Fear of Current or Ex-Partner: No    Emotionally Abused: No    Physically Abused: No    Sexually Abused: No    Family History  Problem Relation Age of Onset   Cancer Mother        ovarian   Other Father        renal disease- grief over loss of spouse   Cancer Brother        prostate- died of hematologic disorder 2nd to chemo   Pulmonary fibrosis Brother    Diabetes Neg Hx    Coronary artery disease Neg Hx    Colon cancer Neg Hx    Stomach cancer Neg Hx    Rectal cancer Neg Hx    Colon polyps Neg Hx    Crohn's disease Neg Hx    Esophageal cancer Neg Hx    Ulcerative colitis Neg Hx     ROS- All systems are reviewed and negative except as per the HPI above  Physical Exam: Vitals:   03/21/22 1503  BP: 134/80  Pulse: 86  Weight: 102.3 kg  Height: _0  (1.753 m)   Wt Readings from Last 3 Encounters:  03/21/22 102.3 kg  03/21/22 102.6 kg  02/18/22 101.2 kg    Labs: Lab Results  Component Value Date   NA 141 02/14/2022   K 5.1 02/14/2022   CL 104 02/14/2022   CO2 28 02/14/2022   GLUCOSE 107 (H) 02/14/2022   BUN 19 02/14/2022   CREATININE 1.00 02/14/2022   CALCIUM 9.6 02/14/2022   PHOS 2.7 02/14/2016   MG 1.7 09/20/2021   Lab Results  Component Value Date   INR 1.52 06/08/2017   Lab Results  Component Value Date   CHOL 98 01/07/2021   HDL 38 01/07/2021   LDLCALC 51 01/07/2021   TRIG 50 01/07/2021    GEN- The patient is a well appearing elderly male, alert and oriented x 3 today.   HEENT-head normocephalic, atraumatic, sclera clear, conjunctiva pink, hearing intact, trachea midline. Lungs- Clear to ausculation bilaterally, normal work of breathing Heart- Regular rate and rhythm, no murmurs, rubs or gallops  GI- soft, NT, ND, + BS Extremities- no clubbing, cyanosis, or edema MS- no significant deformity or atrophy Skin- no rash or lesion Psych- euthymic mood, full affect Neuro- strength and  sensation are intact   EKG- SR with short PR vs atypical atrial flutter with 2:1 block Vent. rate 86 BPM PR interval 98 ms QRS duration 86 ms QT/QTcB 382/457 ms   Assessment and Plan: 1. Persistent Afib  Patient appears to have been in persistent atrial fibrillation since the end of September.  ECG difficult to see if he is in SR or a slow 2:1 atrial flutter. Will have him send a manual transmission from home to confirm his rhythm. If in atrial flutter, will arrange for DCCV. Will need to discuss timing given his upcoming colonoscopy.  Continue dofetilide 500 mcg BID Continue metoprolol 25 mg BID Continue diltiazem 120 mg daily with 30 mg PRN q 4 hours for heart racing.  2. CHA2DS2VASc score of 4 Continue eliquis 5 mg BID  3. PPM Followed by Dr Caryl Comes and the device clinic.  4. HTN Stable, no changes today.   Follow up with Dr Caryl Comes as scheduled.  Sooner in AF clinic pending device interrogation.    [facility] [address] [place], [place] [address] [phone]

## 2022-03-21 NOTE — Progress Notes (Signed)
No egg or soy allergy known to patient  No issues known to pt with past sedation with any surgeries or procedures Patient denies ever being told they had issues or difficulty with intubation  No FH of Malignant Hyperthermia Pt is not on diet pills Pt is not on  home 02  Pt is ELIQUIS on blood thinners  Pt denies issues with constipation  H/O A fib or A flutter Have any cardiac testing pending--NO Pt instructed to use Singlecare.com or GoodRx for a price reduction on prep    Patient's chart reviewed by Osvaldo Angst CNRA prior to previsit and patient appropriate for the [place].  Previsit completed and red dot placed by patient's name on their procedure day (on provider's schedule).

## 2022-03-22 ENCOUNTER — Telehealth: Payer: Self-pay

## 2022-03-22 ENCOUNTER — Other Ambulatory Visit: Payer: Self-pay | Admitting: Internal Medicine

## 2022-03-22 ENCOUNTER — Encounter (HOSPITAL_COMMUNITY): Payer: Self-pay

## 2022-03-22 NOTE — Telephone Encounter (Signed)
I called the patient and left a message on his voicemail to send a manual transmission with his home monitor. I left the device clinic number in case he needed help.

## 2022-03-23 ENCOUNTER — Other Ambulatory Visit (HOSPITAL_COMMUNITY): Payer: Self-pay | Admitting: *Deleted

## 2022-03-23 ENCOUNTER — Telehealth: Payer: Self-pay | Admitting: Internal Medicine

## 2022-03-23 DIAGNOSIS — I4819 Other persistent atrial fibrillation: Secondary | ICD-10-CM

## 2022-03-23 NOTE — Telephone Encounter (Signed)
Spoke with pt and resent prep instructions via [facility]

## 2022-03-23 NOTE — Telephone Encounter (Signed)
Inbound call from patient rescheduling procedure. Please update prep instructions.

## 2022-03-28 DIAGNOSIS — G4733 Obstructive sleep apnea (adult) (pediatric): Secondary | ICD-10-CM | POA: Diagnosis not present

## 2022-04-06 ENCOUNTER — Encounter (HOSPITAL_COMMUNITY): Payer: Self-pay | Admitting: Cardiology

## 2022-04-06 ENCOUNTER — Ambulatory Visit (HOSPITAL_COMMUNITY)
Admission: RE | Admit: 2022-04-06 | Discharge: 2022-04-06 | Disposition: A | Discharge status: Home / Self Care | Payer: PPO | Source: Ambulatory Visit | Attending: Physician Assistant | Admitting: Physician Assistant

## 2022-04-06 ENCOUNTER — Encounter (HOSPITAL_COMMUNITY): Payer: Self-pay | Admitting: Certified Registered"

## 2022-04-06 ENCOUNTER — Encounter (HOSPITAL_COMMUNITY)
Admission: RE | Disposition: A | Discharge status: Home / Self Care | Payer: Self-pay | Source: Home / Self Care | Attending: Cardiology

## 2022-04-06 ENCOUNTER — Ambulatory Visit (HOSPITAL_COMMUNITY)
Admission: RE | Admit: 2022-04-06 | Discharge: 2022-04-06 | Disposition: A | Discharge status: Home / Self Care | Payer: PPO | Attending: Cardiology | Admitting: Cardiology

## 2022-04-06 ENCOUNTER — Other Ambulatory Visit: Payer: Self-pay

## 2022-04-06 DIAGNOSIS — Z79899 Other long term (current) drug therapy: Secondary | ICD-10-CM | POA: Insufficient documentation

## 2022-04-06 DIAGNOSIS — I4819 Other persistent atrial fibrillation: Secondary | ICD-10-CM

## 2022-04-06 DIAGNOSIS — Z538 Procedure and treatment not carried out for other reasons: Secondary | ICD-10-CM | POA: Diagnosis not present

## 2022-04-06 DIAGNOSIS — I10 Essential (primary) hypertension: Secondary | ICD-10-CM | POA: Insufficient documentation

## 2022-04-06 DIAGNOSIS — Z87891 Personal history of nicotine dependence: Secondary | ICD-10-CM | POA: Insufficient documentation

## 2022-04-06 DIAGNOSIS — I480 Paroxysmal atrial fibrillation: Secondary | ICD-10-CM

## 2022-04-06 DIAGNOSIS — Z7901 Long term (current) use of anticoagulants: Secondary | ICD-10-CM | POA: Insufficient documentation

## 2022-04-06 DIAGNOSIS — Z950 Presence of cardiac pacemaker: Secondary | ICD-10-CM | POA: Diagnosis not present

## 2022-04-06 DIAGNOSIS — I4892 Unspecified atrial flutter: Secondary | ICD-10-CM

## 2022-04-06 LAB — BASIC METABOLIC PANEL
Anion gap: 12 (ref 5–15)
BUN: 16 mg/dL (ref 8–23)
CO2: 25 mmol/L (ref 22–32)
Calcium: 9.8 mg/dL (ref 8.9–10.3)
Chloride: 103 mmol/L (ref 98–111)
Creatinine, Ser: 0.99 mg/dL (ref 0.61–1.24)
GFR, Estimated: >60 mL/min (ref >60)
Glucose, Bld: 106 mg/dL — ABNORMAL HIGH (ref 70–99)
Potassium: 4.9 mmol/L (ref 3.5–5.1)
Sodium: 140 mmol/L (ref 135–145)

## 2022-04-06 LAB — CBC
HCT: 46.6 % (ref 39.0–52.0)
Hemoglobin: 15.8 g/dL (ref 13.0–17.0)
MCH: 33.2 pg (ref 26.0–34.0)
MCHC: 33.9 g/dL (ref 30.0–36.0)
MCV: 97.9 fL (ref 80.0–100.0)
Platelets: 221 10*3/uL (ref 150–400)
RBC: 4.76 MIL/uL (ref 4.22–5.81)
RDW: 12.9 % (ref 11.5–15.5)
WBC: 8.8 10*3/uL (ref 4.0–10.5)
nRBC: 0 % (ref 0.0–0.2)

## 2022-04-06 SURGERY — CANCELLED PROCEDURE

## 2022-04-06 MED ORDER — SODIUM CHLORIDE 0.9 % IV SOLN: INTRAVENOUS | Status: DC

## 2022-04-06 NOTE — Interval H&P Note (Signed)
History and Physical Interval Note:  04/06/2022 9:40 AM  Donald [name]  has presented today for surgery, with the diagnosis of AFIB.  The various methods of treatment have been discussed with the patient and family. After consideration of risks, benefits and other options for treatment, the patient has consented to  Procedure(s): CARDIOVERSION (N/A) as a surgical intervention.  The patient's history has been reviewed, patient examined, no change in status, stable for surgery.  I have reviewed the patient's chart and labs.  Questions were answered to the patient's satisfaction.     Freada Bergeron

## 2022-04-06 NOTE — Progress Notes (Signed)
Patient A-paced on the monitor. Procedure cancelled.   Gwyndolyn Kaufman, MD

## 2022-04-06 NOTE — Progress Notes (Signed)
Patient came into department today for cardioversion. He is atrial paced rhythm. Procedure cancelled

## 2022-04-11 DIAGNOSIS — N401 Enlarged prostate with lower urinary tract symptoms: Secondary | ICD-10-CM | POA: Diagnosis not present

## 2022-04-11 DIAGNOSIS — R351 Nocturia: Secondary | ICD-10-CM | POA: Diagnosis not present

## 2022-04-11 DIAGNOSIS — G4733 Obstructive sleep apnea (adult) (pediatric): Secondary | ICD-10-CM | POA: Diagnosis not present

## 2022-04-11 DIAGNOSIS — N5201 Erectile dysfunction due to arterial insufficiency: Secondary | ICD-10-CM | POA: Diagnosis not present

## 2022-04-14 ENCOUNTER — Encounter: Payer: PPO | Admitting: Internal Medicine

## 2022-04-22 ENCOUNTER — Ambulatory Visit (INDEPENDENT_AMBULATORY_CARE_PROVIDER_SITE_OTHER): Payer: PPO | Admitting: Pulmonary Disease

## 2022-04-22 ENCOUNTER — Encounter: Payer: Self-pay | Admitting: Pulmonary Disease

## 2022-04-22 VITALS — BP 116/64 | HR 63 | Temp 97.8°F | Ht 68.0 in | Wt 224.6 lb

## 2022-04-22 DIAGNOSIS — G4733 Obstructive sleep apnea (adult) (pediatric): Secondary | ICD-10-CM | POA: Diagnosis not present

## 2022-04-22 DIAGNOSIS — J849 Interstitial pulmonary disease, unspecified: Secondary | ICD-10-CM

## 2022-04-22 LAB — PULMONARY FUNCTION TEST
DL/VA % pred: 75 %
DL/VA: 2.89 ml/min/mmHg/L
DLCO cor % pred: 68 %
DLCO cor: 15.12 ml/min/mmHg
DLCO unc % pred: 70 %
DLCO unc: 15.61 ml/min/mmHg
FEF 25-75 Post: 2.24 L/sec
FEF 25-75 Pre: 1.51 L/sec
FEF2575-%Change-Post: 48 %
FEF2575-%Pred-Post: 155 %
FEF2575-%Pred-Pre: 105 %
FEV1-%Change-Post: 8 %
FEV1-%Pred-Post: 105 %
FEV1-%Pred-Pre: 96 %
FEV1-Post: 2.44 L
FEV1-Pre: 2.24 L
FEV1FVC-%Change-Post: 4 %
FEV1FVC-%Pred-Pre: 104 %
FEV6-%Change-Post: 4 %
FEV6-%Pred-Post: 102 %
FEV6-%Pred-Pre: 97 %
FEV6-Post: 3.19 L
FEV6-Pre: 3.05 L
FEV6FVC-%Change-Post: 0 %
FEV6FVC-%Pred-Post: 108 %
FEV6FVC-%Pred-Pre: 108 %
FVC-%Change-Post: 4 %
FVC-%Pred-Post: 94 %
FVC-%Pred-Pre: 90 %
FVC-Post: 3.19 L
FVC-Pre: 3.07 L
Post FEV1/FVC ratio: 76 %
Post FEV6/FVC ratio: 100 %
Pre FEV1/FVC ratio: 73 %
Pre FEV6/FVC Ratio: 99 %
RV % pred: 86 %
RV: 2.33 L
TLC % pred: 82 %
TLC: 5.49 L

## 2022-04-22 NOTE — Progress Notes (Signed)
Donald [name]    [id number]    Oct 04, 1932  Primary Care Physician:Gupta, Rene Kocher, MD  Referring Physician: Virgie Dad, MD [place],  [place] [id number]  Chief complaint: Follow-up for pulmonary fibrosis  HPI: 88 year old with recurrent atrial fibrillation, OSA, ex-smoker.  Evaluated in the ILD clinic on 05/24/2022 after high-resolution CT scan that showed pulmonary fibrosis.  At that point he was being considered for amiodarone for atrial fibrillation.  Seen by Dr. Chase Caller and recommended against amiodarone.  He is currently on Tikosyn and follows with Dr. Caryl Comes, EP   Pets: No pets Occupation: Worked as a Land Exposures: Had a down pillow and comforters which he got rid of last month.  No ongoing exposure.  No mold, hot tub, Jacuzzi, asbestos, humidifier Smoking history: 50-pack-year smoker.  Quit in 2000 Travel history: From [place].  No significant Relevant family history: Brother died of pulmonary fibrosis.  Interim history: Here for follow-up of indeterminate pulmonary fibrosis His breathing is doing well with no issues He remains active by doing swimming and water aerobics for half an hour every day.  Had right colectomy on November 2022 for a diagnosis of stage III colon cancer.  And completed chemotherapy under the care of Dr. Benay Spice.  He does note some worsening dyspnea while on chemotherapy but is recovering now.  Outpatient Encounter Medications as of 04/22/2022  Medication Sig   acetaminophen (TYLENOL) 500 MG tablet Take 500 mg by mouth every 6 (six) hours as needed for moderate pain or headache.   albuterol (VENTOLIN HFA) 108 (90 Base) MCG/ACT inhaler Inhale TWO puffs into THE lungs every SIX hours as needed FOR wheezing OR shortness of breath   apixaban (ELIQUIS) 5 MG TABS tablet TAKE ONE TABLET BY MOUTH EVERY MORNING AND TAKE ONE TABLET BY MOUTH EVERY DAY AT BEDTIME   carboxymethylcellulose (REFRESH PLUS) 0.5 %  SOLN Place 1 drop into both eyes daily as needed (dry eyes).   COVID-19 mRNA vaccine 2023-2024 (COMIRNATY) SUSP injection Inject into the muscle.   diltiazem (CARDIZEM CD) 120 MG 24 hr capsule TAKE ONE CAPSULE BY MOUTH AT BREAKFAST AND AT BEDTIME   dofetilide (TIKOSYN) 500 MCG capsule TAKE 1 CAPSULE BY MOUTH EVERY MORNING AND 1 CAPSULE AT BEDTIME   furosemide (LASIX) 20 MG tablet TAKE ONE TABLET BY MOUTH ONCE DAILY   influenza vaccine adjuvanted (FLUAD) 0.5 ML injection Inject into the muscle.   magnesium oxide (MAG-OX) 400 MG tablet TAKE ONE TABLET BY MOUTH DAILY (Patient taking differently: Take 400 mg by mouth at bedtime.)   metoprolol tartrate (LOPRESSOR) 25 MG tablet TAKE ONE TABLET BY MOUTH EVERY MORNING AND TAKE ONE TABLET BY MOUTH EVERY DAY AT BEDTIME   Pedialyte (PEDIALYTE) SOLN Take 178 mLs by mouth daily. 6 oz   Propylhexedrine (BENZEDREX) INHA Place 1 each into the nose daily as needed (congestion).   rosuvastatin (CRESTOR) 10 MG tablet TAKE ONE TABLET BY MOUTH ONCE DAILY   RSV vaccine recomb adjuvanted (AREXVY) 120 MCG/0.5ML injection Inject into the muscle.   sildenafil (REVATIO) 20 MG tablet Take 40 mg by mouth daily as needed (erectile dysfunction).   sodium chloride (OCEAN) 0.65 % SOLN nasal spray Place 1 spray into both nostrils as needed for congestion.   Wheat Dextrin (BENEFIBER) POWD Take 1 Scoop by mouth daily.   No facility-administered encounter medications on file as of 04/22/2022.    Allergies as of 04/22/2022   (No Known Allergies)  Physical Exam: Blood pressure 140/62, pulse 73, temperature 98 F (36.7 C), temperature source Oral, height '5\' 9"'$  (1.753 m), weight 223 lb 6.4 oz (101.3 kg), SpO2 97 %. Gen:      No acute distress HEENT:  EOMI, sclera anicteric Neck:     No masses; no thyromegaly Lungs:    Clear to auscultation bilaterally; normal respiratory effort CV:         Regular rate and rhythm; no murmurs Abd:      + bowel sounds; soft, non-tender; no  palpable masses, no distension Ext:    No edema; adequate peripheral perfusion Skin:      Warm and dry; no rash Neuro: alert and oriented x 3 Psych: normal mood and affect   Data Reviewed: Imaging: CT chest 05/07/2013- mild emphysematous changes, compressive atelectasis in the right lower lobe.   CT high-resolution 04/23/2018- bronchial wall thickening with mild emphysema.  Patchy areas of peripheral septal thickening, mild groundglass.  Indeterminate for UIP. CT high-resolution 05/06/2020- mild emphysema, stable mild interstitial lung disease, indeterminate for UIP. CT high-resolution 02/16/2021-emphysema, stable interstitial lung disease and indeterminate pattern CT abdomen pelvis 02/14/2022-mild pulmonary fibrosis in indeterminate pattern.  Appears unchanged I have reviewed the images personally.  PFTs: 04/18/2018 FVC 3.43 [96%), FEV1 2.63 [105%], F/F 77, TLC 6.37 [95%], DLCO 17.94 [60%]  05/18/2020 FVC 3.39 [97%], FEV1 2.60 [108%], F/F 77, TLC 5.54 [83%], DLCO 16.87 [75%]  04/22/2022 FVC 3.19 (94%)  FEV1 2.44 (105%), F/F76, TLC 5.49 [82%], DLCO 15.61 [70%] Mild diffusion defect   Labs: Hypersensitivity panel 05/24/2018-negative CTD serologies-ANA negative, CCP less than 16, double-stranded DNA, Ro, La, SCL 70- Rheumatoid factor 14  Aldolase 5.3 CK 3 1 ANCA-negative  Myositis panel 07/12/2018-negative  Assessment:  Interstitial lung disease, pulmonary fibrosis Unclear etiology.  The fibrosis is indeterminate for UIP on CT scan and appears to be new compared to 2014.  Possibilities include hypersensitivity pneumonitis given his exposure to down pillow and comforter or IPF as he has a family history of pulmonary fibrosis.  He has gotten rid of his down pillows and comforters in 2020  ILD serologies reviewed with borderline rheumatoid factor which is nonspecific.    Discussed further work-up including lung biopsy.  He is opposed to surgical lung biopsy or bronchoscope due to risks  involved.  I agree that at his age we can continue with conservative management and monitoring especially as his CT scan shows no progression of fibrosis.  Follow-up in 1 year with repeat CT scan and PFTs.  Stage III colon cancer Status post colectomy and chemotherapy.  OSA Download reviewed today.  He has good compliance and response to therapy.  Plan/Recommendations: - Continue CPAP - PFTs, CT scan in 1 year  Marshell Garfinkel MD [facility] 04/22/2022, 1:41 PM  CC: Virgie Dad, MD

## 2022-04-22 NOTE — Patient Instructions (Signed)
I am glad you are stable with your breathing CT looks stable.  PFTs do show small decline in lung function.  Will continue to monitor this Order high-res CT and PFTs in 1 year Return to clinic in 1 year.

## 2022-04-22 NOTE — Progress Notes (Signed)
PFT done today. 

## 2022-04-27 ENCOUNTER — Encounter: Payer: Self-pay | Admitting: Internal Medicine

## 2022-04-27 ENCOUNTER — Ambulatory Visit: Payer: PPO | Attending: Internal Medicine | Admitting: Internal Medicine

## 2022-04-27 VITALS — BP 124/68 | HR 60 | Ht 68.0 in | Wt 226.0 lb

## 2022-04-27 DIAGNOSIS — I5032 Chronic diastolic (congestive) heart failure: Secondary | ICD-10-CM

## 2022-04-27 DIAGNOSIS — Z950 Presence of cardiac pacemaker: Secondary | ICD-10-CM | POA: Diagnosis not present

## 2022-04-27 DIAGNOSIS — Z79899 Other long term (current) drug therapy: Secondary | ICD-10-CM

## 2022-04-27 DIAGNOSIS — I4819 Other persistent atrial fibrillation: Secondary | ICD-10-CM | POA: Diagnosis not present

## 2022-04-27 DIAGNOSIS — I4892 Unspecified atrial flutter: Secondary | ICD-10-CM

## 2022-04-27 NOTE — Progress Notes (Signed)
Patient Care Team: Virgie Dad, MD as PCP - General (Internal Medicine) Deboraha Sprang, MD as PCP - Cardiology (Cardiology) Deboraha Sprang, MD as PCP - Electrophysiology (Cardiology) Franchot Gallo, MD (Urology) [name], [name], MD (Dermatology) Monna Fam, MD as Consulting Physician (Ophthalmology) Deboraha Sprang, MD as Consulting Physician (Cardiology) Pyrtle, Lajuan Lines, MD as Consulting Physician (Gastroenterology) Brand Males, MD as Consulting Physician (Pulmonary Disease) Charlton Haws, [facility] as Pharmacist (Pharmacist)   HPI  Donald [name] is a 88 y.o. 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   The patient denies chest pain, shortness of breath, nocturnal dyspnea, orthopnea or peripheral edema.  There have been no palpitations, lightheadedness or syncope.  Complained of some exercise impairment retrospectively with atrial fibrillation.  Not quite sure.Marland Kitchen   He is status post surgery for stage III colon cancer.  11/22.  He just got a clean bill for year 1   DATE TEST EF   10/15    Echo  55 %  mild AS ( mean gradient 10) --? Bicuspid aortic valve  9/16 myoview 60-65%   mild ischemia  10/17 Echo 60-65%  No comment on bicuspid valve//mild - Mod  AI   11/18 Cath 55-65% Mod non obstructive CAD   1/20 Echo 60-65% LAE (52/2.5/45)  10/22 Echo   60-65% BAE     Date Cr K Mg Hgb TSH  10/17 0.97  4.6  13.2   0.56  4/18 1.07 5.2  13.9   1/19 1.26 5.1  14 0.46  1/20 1.04 4.7 2.1 13.6   10/20 0.97 4.8 2.0 14.0 0.68  3/23 1.00 4.5 1.7 (5/23) 13.7 1.9 (9/22)  11/23 0.99 4.9   15.8      thromboembolic risk factors ( age  -2, HTN-1, ) for a CHADSVASc Score of 3       Past Medical History:  Diagnosis Date   Abnormal CT scan, stomach 02/2014   Thickening of gastric fundus and cardia.  gastritis on EGD 03/2014   Allergic rhinitis due to pollen    Anemia 12/2020   Arthritis    joints,finger   Arthropathy, unspecified, site unspecified    Atherosclerosis    a. Noted by abdominal CT 02/2014 (h/o normal nuc 2011).   Atrial flutter ([place]) 02/24/2014   s/p ablation 11/15   Blood transfusion without reported diagnosis    Cardiomyopathy ([place])    tachy mediated - a. TEE (10/15):  EF 30%;  b. Echo after NSR restored (10/15):  mild LVH, EF 55-60%, mild AS, mild AI, mild MR, mild to mod LAE, mild RAE   Cataract    bilateral,removed replaced with implants   CHF (congestive heart failure) ([place]) 01/04/2021   COVID-19 11/2020   Diverticulosis    severe in descending, sigmoid colon.    ED (erectile dysfunction)    Emphysema of lung ([place])    Esophageal stricture 03/2014   traversable with endoscope. not dilated.    Gastric AVM    GERD (gastroesophageal reflux disease) 03/2014   small HH and gastritis on EGD   GI bleed    Habitual alcohol  use    Hemorrhoids    Hiatal hernia    Hyperlipidemia    Hypertension    Obesity    OSA on CPAP    PAF (paroxysmal atrial fibrillation) ([place])    Pneumonia 12/2020   mild with covid   Presence of permanent cardiac pacemaker    Prostatitis, unspecified    Pulmonary fibrosis (HCC)    Sinus bradycardia    a. s/p STJ dual chamber PPM   Skin cancer    basal and squamous cell   Sleep apnea    Substance abuse ([place])    etoh    Past Surgical History:  Procedure Laterality Date   ATRIAL FLUTTER ABLATION N/A 03/19/2014   RFCA of atrial flutter by Dr Caryl Comes   CARDIOVERSION N/A 02/24/2014   Procedure: CARDIOVERSION;  Surgeon: Lelon Perla, MD;  Location: [place];  Service: Cardiovascular;  Laterality: N/A;   CARDIOVERSION Right  09/10/2014   Procedure: CARDIOVERSION;  Surgeon: Deboraha Sprang, MD;  Location: [facility] CATH LAB;  Service: Cardiovascular;  Laterality: Right;   CARDIOVERSION N/A 01/22/2018   Procedure: CARDIOVERSION;  Surgeon: Fay Records, MD;  Location: [facility] ENDOSCOPY;  Service: Cardiovascular;  Laterality: N/A;   COLONOSCOPY N/A 04/23/2015   Procedure: COLONOSCOPY;  Surgeon: Ladene Artist, MD;  Location: [facility] ENDOSCOPY;  Service: Endoscopy;  Laterality: N/A;   COLONOSCOPY     ELBOW SURGERY Right    SCREW PLACED   ENTEROSCOPY N/A 06/12/2015   Procedure: ENTEROSCOPY;  Surgeon: Ladene Artist, MD;  Location: [facility] ENDOSCOPY;  Service: Endoscopy;  Laterality: N/A;   EP IMPLANTABLE DEVICE N/A 02/15/2016   Procedure: Pacemaker Implant;  Surgeon: Evans Lance, MD;  Location: [place] CV LAB;  Service: Cardiovascular;  Laterality: N/A;   ESOPHAGOGASTRODUODENOSCOPY N/A 04/09/2014   Procedure: ESOPHAGOGASTRODUODENOSCOPY (EGD);  Surgeon: Inda Castle, MD;  Location: [place];  Service: Endoscopy;  Laterality: N/A;   ESOPHAGOGASTRODUODENOSCOPY N/A 04/23/2015   Procedure: ESOPHAGOGASTRODUODENOSCOPY (EGD);  Surgeon: Ladene Artist, MD;  Location: [facility] ENDOSCOPY;  Service: Endoscopy;  Laterality: N/A;   INGUINAL HERNIA REPAIR     right   INGUINAL HERNIA REPAIR     left   LEFT HEART CATH AND CORONARY ANGIOGRAPHY N/A 03/16/2017   Procedure: LEFT HEART CATH AND CORONARY ANGIOGRAPHY;  Surgeon: Burnell Blanks, MD;  Location: [place] CV LAB;  Service: Cardiovascular;  Laterality: N/A;   ORIF fracture of the elbow  1992   SKIN CANCER EXCISION  05/21/2012   Squamous cell ca   TEE WITHOUT CARDIOVERSION N/A 02/24/2014   Procedure: TRANSESOPHAGEAL ECHOCARDIOGRAM (TEE);  Surgeon: Lelon Perla, MD;  Location: [facility] ENDOSCOPY;  Service: Cardiovascular;  Laterality: N/A;   TONSILLECTOMY     AGE 32   UPPER GASTROINTESTINAL ENDOSCOPY      Current Outpatient Medications  Medication Sig Dispense Refill    acetaminophen (TYLENOL) 500 MG tablet Take 500 mg by mouth every 6 (six) hours as needed for moderate pain or headache.     albuterol (VENTOLIN HFA) 108 (90 Base) MCG/ACT inhaler Inhale TWO puffs into THE lungs every SIX hours as needed FOR wheezing OR shortness of breath 8.5 g 3   apixaban (ELIQUIS) 5 MG TABS tablet TAKE ONE TABLET BY MOUTH EVERY MORNING AND TAKE ONE TABLET BY MOUTH EVERY DAY AT BEDTIME 60 tablet 5   carboxymethylcellulose (REFRESH PLUS) 0.5 % SOLN Place 1 drop into both eyes daily as needed (dry eyes).     diltiazem (CARDIZEM CD) 120 MG 24  hr capsule TAKE ONE CAPSULE BY MOUTH AT BREAKFAST AND AT BEDTIME 180 capsule 3   dofetilide (TIKOSYN) 500 MCG capsule TAKE 1 CAPSULE BY MOUTH EVERY MORNING AND 1 CAPSULE AT BEDTIME 180 capsule 2   furosemide (LASIX) 20 MG tablet TAKE ONE TABLET BY MOUTH ONCE DAILY 90 tablet 2   magnesium oxide (MAG-OX) 400 MG tablet TAKE ONE TABLET BY MOUTH DAILY (Patient taking differently: Take 400 mg by mouth at bedtime.) 30 tablet 6   metoprolol tartrate (LOPRESSOR) 25 MG tablet TAKE ONE TABLET BY MOUTH EVERY MORNING AND TAKE ONE TABLET BY MOUTH EVERY DAY AT BEDTIME 60 tablet 11   Pedialyte (PEDIALYTE) SOLN Take 178 mLs by mouth daily. 6 oz     Propylhexedrine (BENZEDREX) INHA Place 1 each into the nose daily as needed (congestion).     rosuvastatin (CRESTOR) 10 MG tablet TAKE ONE TABLET BY MOUTH ONCE DAILY 90 tablet 3   sildenafil (REVATIO) 20 MG tablet Take 40 mg by mouth daily as needed (erectile dysfunction).     sodium chloride (OCEAN) 0.65 % SOLN nasal spray Place 1 spray into both nostrils as needed for congestion.     Wheat Dextrin (BENEFIBER) POWD Take 1 Scoop by mouth daily.     No current facility-administered medications for this visit.    No Known Allergies  Review of Systems negative except from HPI and PMH  Physical Exam BP 124/68   Pulse 60   Ht '5\' 8"'$  (1.727 m)   Wt 226 lb (102.5 kg)   SpO2 97%   BMI 34.36 kg/m  Well developed  and well nourished in no acute distress HENT normal Neck supple with JVP-flat Clear Device pocket well healed; without hematoma or erythema.  There is no tethering  Regular rate and rhythm, no  gallop No / murmur Abd-soft with active BS No Clubbing cyanosis edema Skin-warm and dry A & Oriented  Grossly normal sensory and motor function  ECG atrial pacing at 60 115/09/44 Otherwise normal  Device function is normal. Programming changes   See Paceart for details   Assessment and  Plan  Atrial fibrillation/flutter-paroxysmal  CHADS-VASc score 5 (age-15 hypertension-1 cardiomyopathy-1 vascular disease-1)  Aortic Valve Bicuspid previously described but no longer evident  Cardiomyopathy-resolved  Pacemaker-St. Jude     Hypertension  HFpEF-chronic  Exertional dyspnea Cath non obstructive disease  Dofetilide   High Risk Medication Surveillance  ED Holding sinus rhythm on dofetilide.  Needs magnesium I will see again in 6 months.  QT interval is within range.  We discussed the identification of atrial fibrillation based on his resting rate in sinus which is lower rate limit of 60 and his lower rate limit in the mode switch is 70.  In the event that he is persistently out of rhythm for 3-4 days he is to call the A-fib clinic.  Blood pressure is well-controlled.  Will continue at 25 twice daily.  No bleeding.  Continue the Eliquis 5 twice daily.  Dosed appropriately for weight and renal function.

## 2022-04-27 NOTE — Patient Instructions (Signed)
Medication Instructions:  Your physician recommends that you continue on your current medications as directed. Please refer to the Current Medication list given to you today.  *If you need a refill on your cardiac medications before your next appointment, please call your pharmacy*   Lab Work: Mg today  If you have labs (blood work) drawn today and your tests are completely normal, you will receive your results only by: [facility] (if you have MyChart) OR A paper copy in the mail If you have any lab test that is abnormal or we need to change your treatment, we will call you to review the results.   Testing/Procedures: None ordered.    Follow-Up: At [facility], you and your health needs are our priority.  As part of our continuing mission to provide you with exceptional heart care, we have created designated Provider Care Teams.  These Care Teams include your primary Cardiologist (physician) and Advanced Practice Providers (APPs -  Physician Assistants and Nurse Practitioners) who all work together to provide you with the care you need, when you need it.  We recommend signing up for the patient portal called "MyChart".  Sign up information is provided on this After Visit Summary.  MyChart is used to connect with patients for Virtual Visits (Telemedicine).  Patients are able to view lab/test results, encounter notes, upcoming appointments, etc.  Non-urgent messages can be sent to your provider as well.   To learn more about what you can do with MyChart, go to [email or web address].    Your next appointment:   6 months with Dr Caryl Comes  Important Information About Sugar

## 2022-04-28 LAB — MAGNESIUM: Magnesium: 2.2 mg/dL (ref 1.6–2.3)

## 2022-05-02 DIAGNOSIS — Z85828 Personal history of other malignant neoplasm of skin: Secondary | ICD-10-CM | POA: Diagnosis not present

## 2022-05-02 DIAGNOSIS — L308 Other specified dermatitis: Secondary | ICD-10-CM | POA: Diagnosis not present

## 2022-05-02 DIAGNOSIS — L821 Other seborrheic keratosis: Secondary | ICD-10-CM | POA: Diagnosis not present

## 2022-05-02 DIAGNOSIS — D225 Melanocytic nevi of trunk: Secondary | ICD-10-CM | POA: Diagnosis not present

## 2022-05-08 ENCOUNTER — Other Ambulatory Visit: Payer: Self-pay | Admitting: Adult Health

## 2022-05-11 ENCOUNTER — Encounter: Payer: PPO | Admitting: Internal Medicine

## 2022-05-13 ENCOUNTER — Telehealth: Payer: Self-pay

## 2022-05-13 NOTE — Telephone Encounter (Signed)
Yes, previous clearance for DOAC acceptable

## 2022-05-13 NOTE — Telephone Encounter (Signed)
This patient was originally scheduled for a colonoscopy on 04/14/22 that was cancelled and now rescheduled for Monday 06/06/2022. He is on Eliquis with a previous clearance for a 2 day hold prior to his procedure on 03/05/21. Are you ok with proceeding with the previous clearance of 2 days? Please advise.   Thank you,  Sammie Bench, Previsit

## 2022-05-17 ENCOUNTER — Ambulatory Visit (INDEPENDENT_AMBULATORY_CARE_PROVIDER_SITE_OTHER): Payer: PPO

## 2022-05-17 ENCOUNTER — Other Ambulatory Visit: Payer: Self-pay | Admitting: Internal Medicine

## 2022-05-17 DIAGNOSIS — I495 Sick sinus syndrome: Secondary | ICD-10-CM | POA: Diagnosis not present

## 2022-05-17 DIAGNOSIS — I4819 Other persistent atrial fibrillation: Secondary | ICD-10-CM

## 2022-05-17 LAB — CUP PACEART REMOTE DEVICE CHECK
Battery Remaining Longevity: 44 mo
Battery Remaining Percentage: 39 %
Battery Voltage: 2.98 V
Brady Statistic AP VP Percent: 1 %
Brady Statistic AP VS Percent: 99 %
Brady Statistic AS VP Percent: 1 %
Brady Statistic AS VS Percent: 1 %
Brady Statistic RA Percent Paced: 92 %
Brady Statistic RV Percent Paced: 1 %
Date Time Interrogation Session: 20240101020016
Implantable Lead Connection Status: 753985
Implantable Lead Connection Status: 753985
Implantable Lead Implant Date: 20171002
Implantable Lead Implant Date: 20171002
Implantable Lead Location: 753859
Implantable Lead Location: 753860
Implantable Pulse Generator Implant Date: 20171002
Lead Channel Impedance Value: 390 Ohm
Lead Channel Impedance Value: 560 Ohm
Lead Channel Pacing Threshold Amplitude: 0.5 V
Lead Channel Pacing Threshold Amplitude: 0.75 V
Lead Channel Pacing Threshold Pulse Width: 0.5 ms
Lead Channel Pacing Threshold Pulse Width: 0.5 ms
Lead Channel Sensing Intrinsic Amplitude: 12 mV
Lead Channel Sensing Intrinsic Amplitude: 3.9 mV
Lead Channel Setting Pacing Amplitude: 1.5 V
Lead Channel Setting Pacing Amplitude: 2.5 V
Lead Channel Setting Pacing Pulse Width: 0.5 ms
Lead Channel Setting Sensing Sensitivity: 2 mV
Pulse Gen Model: 2272
Pulse Gen Serial Number: 7951215

## 2022-05-18 ENCOUNTER — Telehealth: Payer: Self-pay

## 2022-05-18 NOTE — Telephone Encounter (Signed)
Transmission received.  Pt presenting rhythm is NSR.    Pt advised.

## 2022-05-18 NOTE — Telephone Encounter (Signed)
Alert received from [employer]:  Scheduled remote reviewed with AF alert for long episode. Normal device function.   AF  burden 6.8% (since 04/27/22 OV). Presenting rhythm AFl with rates 80bpm, onset 05/14/22. [place]- Eliquis, on Tikosyn. Routing for further review  Will have Pt send manual transmission to determine if arrhythmia continues.

## 2022-05-18 NOTE — Telephone Encounter (Signed)
Outreach made to Pt.  Advised alert received that he was in atrial fibrillation.  Per Pt he was not aware he was having afib.  He is headed home and will send a transmission on arrival.  Continue to monitor.

## 2022-05-18 NOTE — Telephone Encounter (Signed)
Prescription refill request for Eliquis received. Indication: Afib  Last office visit: 04/27/22 Caryl Comes)  Scr: 0.99 (04/06/22)  Age: 89 Weight: 102.5kg  Appropriate dose and refill sent to requested pharmacy.

## 2022-05-20 ENCOUNTER — Encounter: Payer: Self-pay | Admitting: Internal Medicine

## 2022-05-23 ENCOUNTER — Ambulatory Visit (AMBULATORY_SURGERY_CENTER): Payer: PPO

## 2022-05-23 ENCOUNTER — Encounter: Payer: Self-pay | Admitting: Internal Medicine

## 2022-05-23 VITALS — Ht 68.0 in | Wt 223.0 lb

## 2022-05-23 DIAGNOSIS — Z85038 Personal history of other malignant neoplasm of large intestine: Secondary | ICD-10-CM

## 2022-05-23 DIAGNOSIS — Z86010 Personal history of colonic polyps: Secondary | ICD-10-CM

## 2022-05-23 NOTE — Progress Notes (Addendum)
No egg or soy allergy known to patient  No issues known to pt with past sedation with any surgeries or procedures Patient denies ever being told they had issues or difficulty with intubation  No FH of Malignant Hyperthermia Pt is not on diet pills Pt is not on  home 02  Pt is taking Eliquis. Instructions given to pt that he is to hold it two days prior to his procedure. Stop date 1/202/2024.  Pt denies issues with constipation  No A fib or A flutter Have any cardiac testing pending--NO Pt instructed to use [email or web address] or GoodRx for a price reduction on prep   Patient's chart reviewed by Osvaldo Angst CNRA prior to previsit and patient appropriate for the [place].  Previsit completed and red dot placed by patient's name on their procedure day (on provider's schedule).

## 2022-05-24 ENCOUNTER — Encounter: Payer: Self-pay | Admitting: Internal Medicine

## 2022-05-24 ENCOUNTER — Non-Acute Institutional Stay: Payer: PPO | Admitting: Internal Medicine

## 2022-05-24 VITALS — BP 142/84 | HR 74 | Temp 97.6°F | Resp 17 | Ht 68.0 in | Wt 230.4 lb

## 2022-05-24 DIAGNOSIS — I2510 Atherosclerotic heart disease of native coronary artery without angina pectoris: Secondary | ICD-10-CM | POA: Diagnosis not present

## 2022-05-24 DIAGNOSIS — I480 Paroxysmal atrial fibrillation: Secondary | ICD-10-CM | POA: Diagnosis not present

## 2022-05-24 DIAGNOSIS — N528 Other male erectile dysfunction: Secondary | ICD-10-CM

## 2022-05-24 DIAGNOSIS — J849 Interstitial pulmonary disease, unspecified: Secondary | ICD-10-CM | POA: Diagnosis not present

## 2022-05-24 DIAGNOSIS — E785 Hyperlipidemia, unspecified: Secondary | ICD-10-CM | POA: Diagnosis not present

## 2022-05-24 DIAGNOSIS — I5032 Chronic diastolic (congestive) heart failure: Secondary | ICD-10-CM | POA: Diagnosis not present

## 2022-05-24 DIAGNOSIS — I10 Essential (primary) hypertension: Secondary | ICD-10-CM

## 2022-05-24 NOTE — Progress Notes (Signed)
Location:  [place] of Service:  Clinic (12)  Provider:   Code Status: *** Goals of Care:     05/24/2022   10:04 AM  Advanced Directives  Does Patient Have a Medical Advance Directive? Yes  Type of Advance Directive Living will;Healthcare Power of [place];Out of facility DNR (pink MOST or yellow form)  Copy of [place] in Chart? Yes - validated most recent copy scanned in chart (See row information)     Chief Complaint  Patient presents with  . Medical Management of Chronic Issues    6 month follow up patient wants to discuss dementia possibly    HPI: Patient is a 89 y.o. male seen today for medical management of chronic diseases.     Past Medical History:  Diagnosis Date  . Abnormal CT scan, stomach 02/2014   Thickening of gastric fundus and cardia.  gastritis on EGD 03/2014  . Allergic rhinitis due to pollen   . Anemia 12/2020  . Arthritis    joints,finger  . Arthropathy, unspecified, site unspecified   . Atherosclerosis    a. Noted by abdominal CT 02/2014 (h/o normal nuc 2011).  . Atrial flutter ([place]) 02/24/2014   s/p ablation 11/15  . Blood transfusion without reported diagnosis   . Cardiomyopathy ([place])    tachy mediated - a. TEE (10/15):  EF 30%;  b. Echo after NSR restored (10/15):  mild LVH, EF 55-60%, mild AS, mild AI, mild MR, mild to mod LAE, mild RAE  . Cataract    bilateral,removed replaced with implants  . CHF (congestive heart failure) ([place]) 01/04/2021  . COVID-19 11/2020  . Diverticulosis    severe in descending, sigmoid colon.   . ED (erectile dysfunction)   . Emphysema of lung ([place])   . Esophageal stricture 03/2014   traversable with endoscope. not dilated.   . Gastric AVM   . GERD (gastroesophageal reflux disease) 03/2014   small HH and gastritis on EGD  . GI bleed   . Habitual alcohol use   . Hemorrhoids   . Hyperlipidemia   . Hypertension   . Obesity   . OSA on CPAP   . PAF  (paroxysmal atrial fibrillation) ([place])   . Pneumonia 12/2020   mild with covid  . Presence of permanent cardiac pacemaker   . Prostatitis, unspecified   . Pulmonary fibrosis ([place])   . 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 Skin cancer    basal and squamous cell  . Sleep apnea   . Substance abuse ([place])    etoh    Past Surgical History:  Procedure Laterality Date  . ATRIAL FLUTTER ABLATION N/A 03/19/2014   RFCA of atrial flutter by Dr Caryl Comes  . CARDIOVERSION N/A 02/24/2014   Procedure: CARDIOVERSION;  Surgeon: Lelon Perla, MD;  Location: [facility] ENDOSCOPY;  Service: Cardiovascular;  Laterality: N/A;  . CARDIOVERSION Right 09/10/2014   Procedure: CARDIOVERSION;  Surgeon: Deboraha Sprang, MD;  Location: [facility] CATH LAB;  Service: Cardiovascular;  Laterality: Right;  . CARDIOVERSION N/A 01/22/2018   Procedure: CARDIOVERSION;  Surgeon: Fay Records, MD;  Location: [place];  Service: Cardiovascular;  Laterality: N/A;  . COLECTOMY  03/24/2021  . COLONOSCOPY N/A 04/23/2015   Procedure: COLONOSCOPY;  Surgeon: Ladene Artist, MD;  Location: [facility] ENDOSCOPY;  Service: Endoscopy;  Laterality: N/A;  . COLONOSCOPY    . ELBOW SURGERY Right    SCREW PLACED  .  ENTEROSCOPY N/A 06/12/2015   Procedure: ENTEROSCOPY;  Surgeon: Ladene Artist, MD;  Location: [facility] ENDOSCOPY;  Service: Endoscopy;  Laterality: N/A;  . EP IMPLANTABLE DEVICE N/A 02/15/2016   Procedure: Pacemaker Implant;  Surgeon: Evans Lance, MD;  Location: [place] CV LAB;  Service: Cardiovascular;  Laterality: N/A;  . ESOPHAGOGASTRODUODENOSCOPY N/A 04/09/2014   Procedure: ESOPHAGOGASTRODUODENOSCOPY (EGD);  Surgeon: Inda Castle, MD;  Location: [place];  Service: Endoscopy;  Laterality: N/A;  . ESOPHAGOGASTRODUODENOSCOPY N/A 04/23/2015   Procedure: ESOPHAGOGASTRODUODENOSCOPY (EGD);  Surgeon: Ladene Artist, MD;  Location: [facility] ENDOSCOPY;  Service: Endoscopy;  Laterality: N/A;  . INGUINAL HERNIA REPAIR     right   . INGUINAL HERNIA REPAIR     left  . LEFT HEART CATH AND CORONARY ANGIOGRAPHY N/A 03/16/2017   Procedure: LEFT HEART CATH AND CORONARY ANGIOGRAPHY;  Surgeon: Burnell Blanks, MD;  Location: [place] CV LAB;  Service: Cardiovascular;  Laterality: N/A;  . ORIF fracture of the elbow  1992  . SKIN CANCER EXCISION  05/21/2012   Squamous cell ca  . TEE WITHOUT CARDIOVERSION N/A 02/24/2014   Procedure: TRANSESOPHAGEAL ECHOCARDIOGRAM (TEE);  Surgeon: Lelon Perla, MD;  Location: [facility] ENDOSCOPY;  Service: Cardiovascular;  Laterality: N/A;  . TONSILLECTOMY     AGE 31  . UPPER GASTROINTESTINAL ENDOSCOPY      No Known Allergies  Outpatient Encounter Medications as of 05/24/2022  Medication Sig  . acetaminophen (TYLENOL) 500 MG tablet Take 500 mg by mouth as needed for moderate pain or headache.  . albuterol (VENTOLIN HFA) 108 (90 Base) MCG/ACT inhaler INHALE TWO PUFFS INTO THE LUNGS EVERY 6 HOURS AS NEEDED FOR WHEEZEING OR SHORTNESS OF BREATH  . apixaban (ELIQUIS) 5 MG TABS tablet Take 1 tablet (5 mg total) by mouth 2 (two) times daily.  . carboxymethylcellulose (REFRESH PLUS) 0.5 % SOLN Place 1 drop into both eyes daily as needed (dry eyes).  Marland Kitchen diltiazem (CARDIZEM CD) 120 MG 24 hr capsule TAKE ONE CAPSULE BY MOUTH AT BREAKFAST AND AT BEDTIME  . dofetilide (TIKOSYN) 500 MCG capsule Take 1 capsule (500 mcg total) by mouth 2 (two) times daily.  . furosemide (LASIX) 20 MG tablet TAKE ONE TABLET BY MOUTH ONCE DAILY  . magnesium oxide (MAG-OX) 400 MG tablet TAKE ONE TABLET BY MOUTH DAILY (Patient taking differently: Take 400 mg by mouth at bedtime.)  . metoprolol tartrate (LOPRESSOR) 25 MG tablet TAKE ONE TABLET BY MOUTH EVERY MORNING AND TAKE ONE TABLET BY MOUTH EVERY DAY AT BEDTIME  . Pedialyte (PEDIALYTE) SOLN Take 178 mLs by mouth daily. 6 oz  . rosuvastatin (CRESTOR) 10 MG tablet TAKE ONE TABLET BY MOUTH ONCE DAILY  . sildenafil (REVATIO) 20 MG tablet Take 20 mg by mouth daily as needed  (erectile dysfunction).  . sodium chloride (OCEAN) 0.65 % SOLN nasal spray Place 1 spray into both nostrils as needed for congestion.  . Wheat Dextrin (BENEFIBER) POWD Take 1 Scoop by mouth daily.  . [DISCONTINUED] Propylhexedrine (BENZEDREX) INHA Place 1 each into the nose daily as needed (congestion).   No facility-administered encounter medications on file as of 05/24/2022.    Review of Systems:  Review of Systems  Health Maintenance  Topic Date Due  . Medicare Annual Wellness (AWV)  04/28/2021  . COVID-19 Vaccine (7 - 2023-24 season) 04/21/2022  . DTaP/Tdap/Td (3 - Td or Tdap) 04/16/2028  . Pneumonia Vaccine 53+ Years old  Completed  . INFLUENZA VACCINE  Completed  . Zoster Vaccines- Shingrix  Completed  . HPV VACCINES  Aged Out    Physical Exam: Vitals:   05/24/22 1000  BP: (!) 142/84  Pulse: 74  Resp: 17  Temp: 97.6 F (36.4 C)  TempSrc: Temporal  SpO2: 96%  Weight: 230 lb 6.4 oz (104.5 kg)  Height: '5\' 8"'$  (1.727 m)   Body mass index is 35.03 kg/m. Physical Exam  Labs reviewed: Basic Metabolic Panel: Recent Labs    09/20/21 0751 02/14/22 0956 02/14/22 1045 04/06/22 0855 04/27/22 1538  NA 140 141  --  140  --   K 4.7 5.1  --  4.9  --   CL 103 104  --  103  --   CO2 28 28  --  25  --   GLUCOSE 144* 107*  --  106*  --   BUN 19 19  --  16  --   CREATININE 1.07 1.02 1.00 0.99  --   CALCIUM 9.6 9.6  --  9.8  --   MG 1.7  --   --   --  2.2   Liver Function Tests: Recent Labs    08/06/21 0918 08/30/21 0816 09/20/21 0751  AST '17 21 19  '$ ALT '14 17 16  '$ ALKPHOS 67 71 62  BILITOT 0.7 1.1 1.0  PROT 7.1 7.4 7.1  ALBUMIN 4.1 4.3 4.1   No results for input(s): "LIPASE", "AMYLASE" in the last 8760 hours. No results for input(s): "AMMONIA" in the last 8760 hours. CBC: Recent Labs    08/06/21 0918 08/30/21 0816 09/20/21 0751 04/06/22 0855  WBC 8.5 8.0 8.1 8.8  NEUTROABS 5.8 5.8 5.8  --   HGB 13.7 14.5 14.2 15.8  HCT 40.1 42.4 42.1 46.6  MCV 101.8*  103.7* 104.7* 97.9  PLT 188 188 195 221   Lipid Panel: No results for input(s): "CHOL", "HDL", "LDLCALC", "TRIG", "CHOLHDL", "LDLDIRECT" in the last 8760 hours. Lab Results  Component Value Date   HGBA1C 5.5 03/08/2021    Procedures since last visit: CUP [place]  Result Date: 05/17/2022 Scheduled remote reviewed with AF alert for long episode. Normal device function.  AF  burden 6.8% (since 04/27/22 OV). Presenting rhythm AFl with rates 80bpm, onset 05/14/22. [place]- Eliquis, on Tikosyn. Routing for further review Next remote 91 days- JJB   Assessment/Plan There are no diagnoses linked to this encounter.   Labs/tests ordered:  * No order type specified * Next appt:  Visit date not found

## 2022-05-25 ENCOUNTER — Encounter: Payer: PPO | Admitting: Internal Medicine

## 2022-05-31 ENCOUNTER — Ambulatory Visit (INDEPENDENT_AMBULATORY_CARE_PROVIDER_SITE_OTHER): Payer: PPO | Admitting: Pulmonary Disease

## 2022-05-31 ENCOUNTER — Encounter (HOSPITAL_BASED_OUTPATIENT_CLINIC_OR_DEPARTMENT_OTHER): Payer: Self-pay | Admitting: Pulmonary Disease

## 2022-05-31 VITALS — BP 116/64 | HR 63 | Temp 98.2°F | Ht 67.75 in | Wt 227.6 lb

## 2022-05-31 DIAGNOSIS — J849 Interstitial pulmonary disease, unspecified: Secondary | ICD-10-CM

## 2022-05-31 DIAGNOSIS — G4733 Obstructive sleep apnea (adult) (pediatric): Secondary | ICD-10-CM | POA: Diagnosis not present

## 2022-05-31 NOTE — Assessment & Plan Note (Signed)
CPAP download was reviewed which shows excellent control of events on 13 cm, mild leak.  He is very compliant more than 8 hours per night. CPAP is only helped improve his daytime somnolence and fatigue  Weight loss encouraged, compliance with goal of at least 4-6 hrs every night is the expectation. Advised against medications with sedative side effects Cautioned against driving when sleepy - understanding that sleepiness will vary on a day to day basis

## 2022-05-31 NOTE — Assessment & Plan Note (Signed)
Appears stable by CT and PFTs. Antifibrotic's not necessary at this time

## 2022-05-31 NOTE — Progress Notes (Signed)
   Subjective:    Patient ID: Donald [name], male    DOB: 05/20/1932, 89 y.o.   MRN: [id number]  HPI  89 yo ex-smoker FU of OSA and ILD,first noted 2019  -DD include hypersensitivity pneumonitis given his exposure to down pillow and comforter or IPF as he has a family history of pulmonary fibrosis    He used dental appliances without any benefit before getting on CPAP  Smoked a pack per day until he quit in 2000, about 40 pack years   PMH -atrial fibrillation status post PPM  Chief Complaint  Patient presents with   Follow-up    Pt states no new issues since LOV.    3-year follow-up visit, last seen by me 04/2019. He has been following with my partner Dr. Vaughan Browner for ILD, this is felt to be "indeterminate", PFTs and CT has been stable without progression. Interim-he underwent right colectomy in November 2022 and underwent chemotherapy for stage III colon cancer.  He is awaiting colonoscopy. Breathing is okay.  He is very compliant with his CPAP and is settled down with a fullface mask wife complains of occasional leak.  Denies any problems with mask or pressure.  He received a new machine about a year ago They now reside at [place] fibrillation is still active every now and then, he has not required DCCV I reviewed cardiology evaluation  Significant tests/ events reviewed CT chest in 04/2013 showed mild emphysema PSG 04/2014 - 206 lbs-Moderate OSA-stopped breathing 24 times an hour.- corrected by C Pap 13 cm, large full facemask  CT high-resolution 02/16/2021-emphysema, stable interstitial lung disease and indeterminate pattern CT abdomen pelvis 02/14/2022-mild pulmonary fibrosis in indeterminate pattern.  Appears unchanged  PFTs: 04/18/2018 FVC 3.43 [96%), FEV1 2.63 [105%], F/F 77, TLC 6.37 [95%], DLCO 17.94 [60%]    04/22/2022 FVC 3.19 (94%)  FEV1 2.44 (105%), F/F76, TLC 5.49 [82%], DLCO 15.61 [70%] Mild diffusion defect   Review of Systems neg for any  significant sore throat, dysphagia, itching, sneezing, nasal congestion or excess/ purulent secretions, fever, chills, sweats, unintended wt loss, pleuritic or exertional cp, hempoptysis, orthopnea pnd or change in chronic leg swelling. Also denies presyncope, palpitations, heartburn, abdominal pain, nausea, vomiting, diarrhea or change in bowel or urinary habits, dysuria,hematuria, rash, arthralgias, visual complaints, headache, numbness weakness or ataxia.     Objective:   Physical Exam  Gen. Pleasant,elderly, obese, in no distress ENT - no lesions, no post nasal drip Neck: No JVD, no thyromegaly, no carotid bruits Lungs: no use of accessory muscles, no dullness to percussion, decreased without rales or rhonchi  Cardiovascular: Rhythm regular, heart sounds  normal, no murmurs or gallops, no peripheral edema Musculoskeletal: No deformities, no cyanosis or clubbing , no tremors       Assessment & Plan:

## 2022-05-31 NOTE — Patient Instructions (Signed)
CPAP is working well. CPAP supplies will be renewed for a year.  ILD stable

## 2022-06-04 ENCOUNTER — Encounter: Payer: Self-pay | Admitting: Internal Medicine

## 2022-06-06 ENCOUNTER — Encounter: Payer: Self-pay | Admitting: Internal Medicine

## 2022-06-06 ENCOUNTER — Ambulatory Visit (AMBULATORY_SURGERY_CENTER): Payer: PPO | Admitting: Internal Medicine

## 2022-06-06 VITALS — BP 118/67 | HR 61 | Temp 98.9°F | Resp 13 | Ht 68.0 in | Wt 223.0 lb

## 2022-06-06 DIAGNOSIS — D124 Benign neoplasm of descending colon: Secondary | ICD-10-CM

## 2022-06-06 DIAGNOSIS — E669 Obesity, unspecified: Secondary | ICD-10-CM | POA: Diagnosis not present

## 2022-06-06 DIAGNOSIS — Z86010 Personal history of colonic polyps: Secondary | ICD-10-CM | POA: Diagnosis not present

## 2022-06-06 DIAGNOSIS — Z09 Encounter for follow-up examination after completed treatment for conditions other than malignant neoplasm: Secondary | ICD-10-CM | POA: Diagnosis not present

## 2022-06-06 DIAGNOSIS — I509 Heart failure, unspecified: Secondary | ICD-10-CM | POA: Diagnosis not present

## 2022-06-06 DIAGNOSIS — Z85038 Personal history of other malignant neoplasm of large intestine: Secondary | ICD-10-CM | POA: Diagnosis not present

## 2022-06-06 DIAGNOSIS — R197 Diarrhea, unspecified: Secondary | ICD-10-CM | POA: Diagnosis not present

## 2022-06-06 DIAGNOSIS — I10 Essential (primary) hypertension: Secondary | ICD-10-CM | POA: Diagnosis not present

## 2022-06-06 DIAGNOSIS — G4733 Obstructive sleep apnea (adult) (pediatric): Secondary | ICD-10-CM | POA: Diagnosis not present

## 2022-06-06 DIAGNOSIS — I480 Paroxysmal atrial fibrillation: Secondary | ICD-10-CM | POA: Diagnosis not present

## 2022-06-06 MED ORDER — SODIUM CHLORIDE 0.9 % IV SOLN: 500.0000 mL | Freq: Once | INTRAVENOUS | Status: DC

## 2022-06-06 MED ORDER — COLESTIPOL HCL 1 G PO TABS: 1.0000 g | ORAL_TABLET | Freq: Every day | ORAL | Status: AC

## 2022-06-06 NOTE — Op Note (Signed)
[facility] Patient Name: Donald [name] Procedure Date: 06/06/2022 9:14 AM MRN: [id number] Endoscopist: Jerene Bears , MD, [id number] Age: 89 Referring MD:  Date of Birth: 05-Dec-1932 Gender: Male Account #: [id number] Procedure:                Colonoscopy Indications:              High risk colon cancer surveillance: Personal                            history of colon cancer, Last colonoscopy:                            September 2022; s/p right hemicolectomy in Sept 2022 Medicines:                Monitored Anesthesia Care Procedure:                Pre-Anesthesia Assessment:                           - Prior to the procedure, a History and Physical                            was performed, and patient medications and                            allergies were reviewed. The patient's tolerance of                            previous anesthesia was also reviewed. The risks                            and benefits of the procedure and the sedation                            options and risks were discussed with the patient.                            All questions were answered, and informed consent                            was obtained. Prior Anticoagulants: The patient has                            taken Eliquis (apixaban), last dose was 2 days                            prior to procedure. ASA Grade Assessment: III - A                            patient with severe systemic disease. After                            reviewing the risks and benefits, the patient was  deemed in satisfactory condition to undergo the                            procedure.                           After obtaining informed consent, the colonoscope                            was passed under direct vision. Throughout the                            procedure, the patient's blood pressure, pulse, and                            oxygen saturations were monitored continuously.  The                            Olympus CF-HQ190L [id number]) Colonoscope was                            introduced through the anus and advanced to the                            ileocolonic anastomosis. The colonoscopy was                            performed without difficulty. The patient tolerated                            the procedure well. The quality of the bowel                            preparation was good. Scope In: 9:27:16 AM Scope Out: 9:44:49 AM Scope Withdrawal Time: 0 hours 13 minutes 35 seconds  Total Procedure Duration: 0 hours 17 minutes 33 seconds  Findings:                 The digital rectal exam was normal.                           There was evidence of a prior end-to-side                            colo-colonic anastomosis in the ascending colon.                            This was patent and was characterized by healthy                            appearing mucosa and an intact staple line.                           A 7 mm polyp was found in the descending colon. The  polyp was sessile. The polyp was removed with a                            cold snare. Resection and retrieval were complete.                           Multiple large-mouthed, medium-mouthed and                            small-mouthed diverticula were found in the sigmoid                            colon and distal descending colon.                           Internal hemorrhoids were found during                            retroflexion. The hemorrhoids were small. Complications:            No immediate complications. Estimated Blood Loss:     Estimated blood loss was minimal. Impression:               - Patent end-to-side colo-colonic anastomosis,                            characterized by healthy appearing mucosa and an                            intact staple line.                           - One 7 mm polyp in the descending colon, removed                             with a cold snare. Resected and retrieved.                           - Moderate diverticulosis in the sigmoid colon and                            in the distal descending colon.                           - Small to medium internal hemorrhoids. Recommendation:           - Patient has a contact number available for                            emergencies. The signs and symptoms of potential                            delayed complications were discussed with the                            patient. Return to normal activities  tomorrow.                            Written discharge instructions were provided to the                            patient.                           - Resume previous diet.                           - Continue present medications.                           - Resume Eliquis (apixaban) at prior dose tonight.                            Refer to managing physician for further adjustment                            of therapy.                           - Await pathology results.                           - No recommendation at this time regarding repeat                            colonoscopy due to age at next surveillance                            interval (3 years). Will determine risk/benefit                            profile for repeat procedure at that time.                           - If loose stools/diarrhea continues to be an issue                            post-surgery we can try colestipol 1-2 g qHS.                            Continue fiber supplement daily. Jerene Bears, MD 06/06/2022 9:51:47 AM This report has been signed electronically.

## 2022-06-06 NOTE — Progress Notes (Signed)
Pt's states no medical or surgical changes since previsit or office visit. 

## 2022-06-06 NOTE — Patient Instructions (Addendum)
Information on polyps, diverticulosis and hemorrhoids given to you today.  Await pathology results.  Resume Eliquis at prior dose tonight.  No recommendation at this time regarding repeat colonoscopy due to age at next surveillance interval (3 years).  Will determine risk/benefit profile for repeat procedure at that tim  We can try colestipol 1-2 g every evening.  Continue fiber supplement.    YOU HAD AN ENDOSCOPIC PROCEDURE TODAY AT [facility]:   Refer to the procedure report that was given to you for any specific questions about what was found during the examination.  If the procedure report does not answer your questions, please call your gastroenterologist to clarify.  If you requested that your care partner not be given the details of your procedure findings, then the procedure report has been included in a sealed envelope for you to review at your convenience later.  YOU SHOULD EXPECT: Some feelings of bloating in the abdomen. Passage of more gas than usual.  Walking can help get rid of the air that was put into your GI tract during the procedure and reduce the bloating. If you had a lower endoscopy (such as a colonoscopy or flexible sigmoidoscopy) you may notice spotting of blood in your stool or on the toilet paper. If you underwent a bowel prep for your procedure, you may not have a normal bowel movement for a few days.  Please Note:  You might notice some irritation and congestion in your nose or some drainage.  This is from the oxygen used during your procedure.  There is no need for concern and it should clear up in a day or so.  SYMPTOMS TO REPORT IMMEDIATELY:  Following lower endoscopy (colonoscopy or flexible sigmoidoscopy):  Excessive amounts of blood in the stool  Significant tenderness or worsening of abdominal pains  Swelling of the abdomen that is new, acute  Fever of 100F or higher  For urgent or emergent issues, a gastroenterologist can be reached at  any hour by calling [phone]. Do not use MyChart messaging for urgent concerns.    DIET:  We do recommend a small meal at first, but then you may proceed to your regular diet.  Drink plenty of fluids but you should avoid alcoholic beverages for 24 hours.  ACTIVITY:  You should plan to take it easy for the rest of today and you should NOT DRIVE or use heavy machinery until tomorrow (because of the sedation medicines used during the test).    FOLLOW UP: Our staff will call the number listed on your records the next business day following your procedure.  We will call around 7:15- 8:00 am to check on you and address any questions or concerns that you may have regarding the information given to you following your procedure. If we do not reach you, we will leave a message.     If any biopsies were taken you will be contacted by phone or by letter within the next 1-3 weeks.  Please call us at [phone] if you have not heard about the biopsies in 3 weeks.    SIGNATURES/CONFIDENTIALITY: You and/or your care partner have signed paperwork which will be entered into your electronic medical record.  These signatures attest to the fact that that the information above on your After Visit Summary has been reviewed and is understood.  Full responsibility of the confidentiality of this discharge information lies with you and/or your care-partner.

## 2022-06-06 NOTE — Progress Notes (Signed)
Called to room to assist during endoscopic procedure.  Patient ID and intended procedure confirmed with present staff. Received instructions for my participation in the procedure from the performing physician.

## 2022-06-06 NOTE — Progress Notes (Signed)
GASTROENTEROLOGY PROCEDURE H&P NOTE   Primary Care Physician: Virgie Dad, MD    Reason for Procedure:  Personal history of colon cancer  Plan:    Surveillance colonoscopy  Patient is appropriate for endoscopic procedure(s) in the ambulatory ([name]) setting.  The nature of the procedure, as well as the risks, benefits, and alternatives were carefully and thoroughly reviewed with the patient. Ample time for discussion and questions allowed. The patient understood, was satisfied, and agreed to proceed.     HPI: Donald [name] is a 89 y.o. male who presents for surveillance colonoscopy.  Medical history as below.  Tolerated the prep.  No recent chest pain or shortness of breath.  No abdominal pain today.  Eliquis on hold x 2 days.  Past Medical History:  Diagnosis Date   Abnormal CT scan, stomach 02/2014   Thickening of gastric fundus and cardia.  gastritis on EGD 03/2014   Allergic rhinitis due to pollen    Anemia 12/2020   Arthritis    joints,finger   Arthropathy, unspecified, site unspecified    Atherosclerosis    a. Noted by abdominal CT 02/2014 (h/o normal nuc 2011).   Atrial flutter ([place]) 02/24/2014   s/p ablation 11/15   Blood transfusion without reported diagnosis    Cardiomyopathy ([place])    tachy mediated - a. TEE (10/15):  EF 30%;  b. Echo after NSR restored (10/15):  mild LVH, EF 55-60%, mild AS, mild AI, mild MR, mild to mod LAE, mild RAE   Cataract    bilateral,removed replaced with implants   CHF (congestive heart failure) ([place]) 01/04/2021   COVID-19 11/2020   Diverticulosis    severe in descending, sigmoid colon.    ED (erectile dysfunction)    Emphysema of lung ([place])    Esophageal stricture 03/2014   traversable with endoscope. not dilated.    Gastric AVM    GERD (gastroesophageal reflux disease) 03/2014   small HH and gastritis on EGD   GI bleed    Habitual alcohol use    Hemorrhoids    Hyperlipidemia    Hypertension    Obesity    OSA on  CPAP    PAF (paroxysmal atrial fibrillation) ([place])    Pneumonia 12/2020   mild with covid   Presence of permanent cardiac pacemaker    Prostatitis, unspecified    Pulmonary fibrosis ([facility])    Sinus bradycardia    a. s/p STJ dual chamber PPM   Skin cancer    basal and squamous cell   Sleep apnea    Substance abuse ([place])    etoh    Past Surgical History:  Procedure Laterality Date   ATRIAL FLUTTER ABLATION N/A 03/19/2014   RFCA of atrial flutter by Dr Caryl Comes   CARDIOVERSION N/A 02/24/2014   Procedure: CARDIOVERSION;  Surgeon: Lelon Perla, MD;  Location: [facility] ENDOSCOPY;  Service: Cardiovascular;  Laterality: N/A;   CARDIOVERSION Right 09/10/2014   Procedure: CARDIOVERSION;  Surgeon: Deboraha Sprang, MD;  Location: [facility] CATH LAB;  Service: Cardiovascular;  Laterality: Right;   CARDIOVERSION N/A 01/22/2018   Procedure: CARDIOVERSION;  Surgeon: Fay Records, MD;  Location: [facility] ENDOSCOPY;  Service: Cardiovascular;  Laterality: N/A;   COLECTOMY  03/24/2021   COLONOSCOPY N/A 04/23/2015   Procedure: COLONOSCOPY;  Surgeon: Ladene Artist, MD;  Location: [facility] ENDOSCOPY;  Service: Endoscopy;  Laterality: N/A;   COLONOSCOPY     ELBOW SURGERY Right    SCREW PLACED   ENTEROSCOPY N/A 06/12/2015  Procedure: ENTEROSCOPY;  Surgeon: Ladene Artist, MD;  Location: [facility] ENDOSCOPY;  Service: Endoscopy;  Laterality: N/A;   EP IMPLANTABLE DEVICE N/A 02/15/2016   Procedure: Pacemaker Implant;  Surgeon: Evans Lance, MD;  Location: [facility] CV LAB;  Service: Cardiovascular;  Laterality: N/A;   ESOPHAGOGASTRODUODENOSCOPY N/A 04/09/2014   Procedure: ESOPHAGOGASTRODUODENOSCOPY (EGD);  Surgeon: Inda Castle, MD;  Location: [facility];  Service: Endoscopy;  Laterality: N/A;   ESOPHAGOGASTRODUODENOSCOPY N/A 04/23/2015   Procedure: ESOPHAGOGASTRODUODENOSCOPY (EGD);  Surgeon: Ladene Artist, MD;  Location: [facility] ENDOSCOPY;  Service: Endoscopy;  Laterality: N/A;   INGUINAL HERNIA REPAIR     right    INGUINAL HERNIA REPAIR     left   LEFT HEART CATH AND CORONARY ANGIOGRAPHY N/A 03/16/2017   Procedure: LEFT HEART CATH AND CORONARY ANGIOGRAPHY;  Surgeon: Burnell Blanks, MD;  Location: [facility] CV LAB;  Service: Cardiovascular;  Laterality: N/A;   ORIF fracture of the elbow  1992   SKIN CANCER EXCISION  05/21/2012   Squamous cell ca   TEE WITHOUT CARDIOVERSION N/A 02/24/2014   Procedure: TRANSESOPHAGEAL ECHOCARDIOGRAM (TEE);  Surgeon: Lelon Perla, MD;  Location: [facility] ENDOSCOPY;  Service: Cardiovascular;  Laterality: N/A;   TONSILLECTOMY     AGE 8   UPPER GASTROINTESTINAL ENDOSCOPY      Prior to Admission medications   Medication Sig Start Date End Date Taking? Authorizing Provider  acetaminophen (TYLENOL) 500 MG tablet Take 500 mg by mouth as needed for moderate pain or headache.   Yes [provider]  albuterol (VENTOLIN HFA) 108 (90 Base) MCG/ACT inhaler INHALE TWO PUFFS INTO THE LUNGS EVERY 6 HOURS AS NEEDED FOR WHEEZEING OR SHORTNESS OF BREATH 05/10/22  Yes Virgie Dad, MD  diltiazem (CARDIZEM CD) 120 MG 24 hr capsule TAKE ONE CAPSULE BY MOUTH AT [facility] AND AT BEDTIME 05/17/22  Yes Deboraha Sprang, MD  dofetilide (TIKOSYN) 500 MCG capsule Take 1 capsule (500 mcg total) by mouth 2 (two) times daily. 05/17/22  Yes Deboraha Sprang, MD  furosemide (LASIX) 20 MG tablet TAKE ONE TABLET BY MOUTH ONCE DAILY 05/17/22  Yes Deboraha Sprang, MD  magnesium oxide (MAG-OX) 400 MG tablet TAKE ONE TABLET BY MOUTH DAILY Patient taking differently: Take 400 mg by mouth at bedtime. 03/22/22  Yes Deboraha Sprang, MD  metoprolol tartrate (LOPRESSOR) 25 MG tablet TAKE ONE TABLET BY MOUTH EVERY MORNING AND TAKE ONE TABLET BY MOUTH EVERY DAY AT BEDTIME 08/23/21  Yes Deboraha Sprang, MD  Pedialyte (PEDIALYTE) SOLN Take 178 mLs by mouth daily. 6 oz   Yes [provider]  rosuvastatin (CRESTOR) 10 MG tablet TAKE ONE TABLET BY MOUTH ONCE DAILY 02/24/22  Yes Virgie Dad, MD   sodium chloride (OCEAN) 0.65 % SOLN nasal spray Place 1 spray into both nostrils as needed for congestion.   Yes [provider]  apixaban (ELIQUIS) 5 MG TABS tablet Take 1 tablet (5 mg total) by mouth 2 (two) times daily. 05/18/22   Deboraha Sprang, MD  carboxymethylcellulose (REFRESH PLUS) 0.5 % SOLN Place 1 drop into both eyes daily as needed (dry eyes).    [provider]  sildenafil (REVATIO) 20 MG tablet Take 20 mg by mouth daily as needed (erectile dysfunction). 02/13/22   [provider]  Wheat Dextrin (BENEFIBER) POWD Take 1 Scoop by mouth daily.    [provider]    Current Outpatient Medications  Medication Sig Dispense Refill   acetaminophen (TYLENOL) 500  MG tablet Take 500 mg by mouth as needed for moderate pain or headache.     albuterol (VENTOLIN HFA) 108 (90 Base) MCG/ACT inhaler INHALE TWO PUFFS INTO THE LUNGS EVERY 6 HOURS AS NEEDED FOR WHEEZEING OR SHORTNESS OF BREATH 8.5 g 3   diltiazem (CARDIZEM CD) 120 MG 24 hr capsule TAKE ONE CAPSULE BY MOUTH AT BREAKFAST AND AT BEDTIME 180 capsule 3   dofetilide (TIKOSYN) 500 MCG capsule Take 1 capsule (500 mcg total) by mouth 2 (two) times daily. 180 capsule 3   furosemide (LASIX) 20 MG tablet TAKE ONE TABLET BY MOUTH ONCE DAILY 90 tablet 3   magnesium oxide (MAG-OX) 400 MG tablet TAKE ONE TABLET BY MOUTH DAILY (Patient taking differently: Take 400 mg by mouth at bedtime.) 30 tablet 6   metoprolol tartrate (LOPRESSOR) 25 MG tablet TAKE ONE TABLET BY MOUTH EVERY MORNING AND TAKE ONE TABLET BY MOUTH EVERY DAY AT BEDTIME 60 tablet 11   Pedialyte (PEDIALYTE) SOLN Take 178 mLs by mouth daily. 6 oz     rosuvastatin (CRESTOR) 10 MG tablet TAKE ONE TABLET BY MOUTH ONCE DAILY 90 tablet 3   sodium chloride (OCEAN) 0.65 % SOLN nasal spray Place 1 spray into both nostrils as needed for congestion.     apixaban (ELIQUIS) 5 MG TABS tablet Take 1 tablet (5 mg total) by mouth 2 (two) times daily. 60 tablet 5    carboxymethylcellulose (REFRESH PLUS) 0.5 % SOLN Place 1 drop into both eyes daily as needed (dry eyes).     sildenafil (REVATIO) 20 MG tablet Take 20 mg by mouth daily as needed (erectile dysfunction).     Wheat Dextrin (BENEFIBER) POWD Take 1 Scoop by mouth daily.     Current Facility-Administered Medications  Medication Dose Route Frequency Provider Last Rate Last Admin   0.9 %  sodium chloride infusion  500 mL Intravenous Once Chaye Misch, Lajuan Lines, MD        Allergies as of 06/06/2022   (No Known Allergies)    Family History  Problem Relation Age of Onset   Cancer Mother        ovarian   Other Father        renal disease- grief over loss of spouse   Cancer Brother        prostate- died of hematologic disorder 2nd to chemo   Pulmonary fibrosis Brother    Diabetes Neg Hx    Coronary artery disease Neg Hx    Colon cancer Neg Hx    Stomach cancer Neg Hx    Rectal cancer Neg Hx    Colon polyps Neg Hx    Crohn's disease Neg Hx    Esophageal cancer Neg Hx    Ulcerative colitis Neg Hx     Social History   Socioeconomic History   Marital status: Married    Spouse name: Not on file   Number of children: 3   Years of education: 16   Highest education level: Not on file  Occupational History   Occupation: Land    Employer: RETIRED  Tobacco Use   Smoking status: Former    Packs/day: 0.00    Years: 40.00    Total pack years: 0.00    Types: Cigarettes, Cigars    Quit date: 09/18/2008    Years since quitting: 13.7    Passive exposure: Past   Smokeless tobacco: Former    Types: Chew    Quit date: 2005   Tobacco comments:  Former smoker 03/21/22  Vaping Use   Vaping Use: Never used  Substance and Sexual Activity   Alcohol use: Yes    Alcohol/week: 14.0 standard drinks of alcohol    Types: 14 Standard drinks or equivalent per week    Comment: 2 drinks nightly 03/21/22   Drug use: No   Sexual activity: Not Currently  Other Topics Concern   Not on file   Social History Narrative   HSG, [place]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 married 1961. 2 sons- '64, '63 , I daughter- '66, 4 grandchildren. work: Museum/gallery curator, retired but still consults. 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      No new stressors/       Consumption of caffeine-Coffee      Exercise- 30 mins swimming daily   Social Determinants of Health   Financial Resource Strain: Low Risk  (04/28/2020)   Overall Financial Resource Strain (CARDIA)    Difficulty of Paying Living Expenses: Not hard at all  Food Insecurity: No Food Insecurity (04/28/2020)   Hunger Vital Sign    Worried About Running Out of Food in the Last Year: Never true    Ran Out of Food in the Last Year: Never true  Transportation Needs: No Transportation Needs (04/28/2020)   PRAPARE - Hydrologist (Medical): No    Lack of Transportation (Non-Medical): No  Physical Activity: Sufficiently Active (04/28/2020)   Exercise Vital Sign    Days of Exercise per Week: 5 days    Minutes of Exercise per Session: 30 min  Stress: No Stress Concern Present (04/28/2020)   [place]    Feeling of Stress : Not at all  Social Connections: [place] (04/28/2020)   Social Connection and Isolation Panel [NHANES]    Frequency of Communication with Friends and Family: More than three times a week    Frequency of Social Gatherings with Friends and Family: More than three times a week    Attends Religious Services: More than 4 times per year    Active Member of [employer] or Organizations: Yes    Attends Music therapist: More than 4 times per year    Marital Status: Married  Human resources officer Violence: Not At Risk (02/27/2018)   Humiliation, Afraid, Rape, and Kick questionnaire    Fear of Current or Ex-Partner: No    Emotionally Abused: No     Physically Abused: No    Sexually Abused: No    Physical Exam: Vital signs in last 24 hours: '@BP'$  (!) 142/89   Pulse 63   Temp 98.9 F (37.2 C)   Ht '5\' 8"'$  (1.727 m)   Wt 223 lb (101.2 kg)   SpO2 98%   BMI 33.91 kg/m  GEN: NAD EYE: Sclerae anicteric ENT: MMM CV: Non-tachycardic Pulm: CTA b/l GI: Soft, NT/ND NEURO:  Alert & Oriented x 3   Zenovia Jarred, MD [facility]  06/06/2022 9:19 AM

## 2022-06-07 ENCOUNTER — Telehealth: Payer: Self-pay | Admitting: *Deleted

## 2022-06-07 ENCOUNTER — Other Ambulatory Visit (HOSPITAL_BASED_OUTPATIENT_CLINIC_OR_DEPARTMENT_OTHER): Payer: Self-pay

## 2022-06-07 ENCOUNTER — Other Ambulatory Visit: Payer: Self-pay

## 2022-06-07 MED ORDER — ZOSTER VAC RECOMB ADJUVANTED 50 MCG/0.5ML IM SUSR
INTRAMUSCULAR | 0 refills | Status: DC
  Filled 2022-06-07: qty 0.5, 1d supply, fill #0

## 2022-06-07 MED ORDER — COLESTIPOL HCL 1 G PO TABS: 1.0000 g | ORAL_TABLET | Freq: Every day | ORAL | 6 refills | Status: DC

## 2022-06-07 NOTE — Telephone Encounter (Signed)
  Follow up Call-     06/06/2022    8:38 AM 02/03/2021    9:48 AM  Call back number  Post procedure Call Back phone  # [phone]  Permission to leave phone message Yes Yes    LMOM

## 2022-06-07 NOTE — Telephone Encounter (Signed)
Colestipol 1 g qHS

## 2022-06-10 DIAGNOSIS — G4733 Obstructive sleep apnea (adult) (pediatric): Secondary | ICD-10-CM | POA: Diagnosis not present

## 2022-06-14 ENCOUNTER — Encounter: Payer: Self-pay | Admitting: Internal Medicine

## 2022-06-21 NOTE — Progress Notes (Signed)
Remote pacemaker transmission.   

## 2022-06-23 ENCOUNTER — Encounter (HOSPITAL_COMMUNITY): Payer: Self-pay | Admitting: *Deleted

## 2022-07-21 ENCOUNTER — Encounter: Payer: Self-pay | Admitting: Internal Medicine

## 2022-07-25 DIAGNOSIS — D225 Melanocytic nevi of trunk: Secondary | ICD-10-CM | POA: Diagnosis not present

## 2022-07-25 DIAGNOSIS — L821 Other seborrheic keratosis: Secondary | ICD-10-CM | POA: Diagnosis not present

## 2022-07-25 DIAGNOSIS — Z85828 Personal history of other malignant neoplasm of skin: Secondary | ICD-10-CM | POA: Diagnosis not present

## 2022-07-25 DIAGNOSIS — L738 Other specified follicular disorders: Secondary | ICD-10-CM | POA: Diagnosis not present

## 2022-07-25 DIAGNOSIS — L309 Dermatitis, unspecified: Secondary | ICD-10-CM | POA: Diagnosis not present

## 2022-07-25 DIAGNOSIS — L245 Irritant contact dermatitis due to other chemical products: Secondary | ICD-10-CM | POA: Diagnosis not present

## 2022-07-25 DIAGNOSIS — L918 Other hypertrophic disorders of the skin: Secondary | ICD-10-CM | POA: Diagnosis not present

## 2022-07-26 ENCOUNTER — Encounter: Payer: Self-pay | Admitting: Internal Medicine

## 2022-07-26 ENCOUNTER — Non-Acute Institutional Stay: Payer: PPO | Admitting: Internal Medicine

## 2022-07-26 VITALS — BP 122/72 | HR 60 | Temp 98.2°F | Resp 17 | Ht 68.0 in | Wt 230.2 lb

## 2022-07-26 DIAGNOSIS — I10 Essential (primary) hypertension: Secondary | ICD-10-CM

## 2022-07-26 DIAGNOSIS — G3184 Mild cognitive impairment, so stated: Secondary | ICD-10-CM | POA: Diagnosis not present

## 2022-07-26 DIAGNOSIS — I480 Paroxysmal atrial fibrillation: Secondary | ICD-10-CM

## 2022-07-26 DIAGNOSIS — J849 Interstitial pulmonary disease, unspecified: Secondary | ICD-10-CM | POA: Diagnosis not present

## 2022-07-27 NOTE — Progress Notes (Signed)
Location:  [place] of Service:  Clinic (12)  Provider:   Code Status:  Goals of Care:     07/26/2022    1:07 PM  Advanced Directives  Does Patient Have a Medical Advance Directive? Yes  Type of Paramedic of [place];Living will     Chief Complaint  Patient presents with   Acute Visit    Patient want to be seen for possible dementia.   Quality Metric Gaps    Discussed the need for AWV    HPI: Patient is a 89 y.o. male seen today for medical management of chronic diseases.    He was here to evaluate for Cognitive impairment and discuss his wife also who is with him  Lives in [place] with his wife  Patient is worried about having some cognitive impairment. He states sometimes he forgets names.  Sometimes it is hard for him to remember recent events.  He still continues to drive and is does his finances .  He took home cognitive test and did very well. He is also worried about his wife who also is having some issues with cognition. They both have very independent and have  daughter in town who also helps He is sedentary but is trying to start walking and do some exercise here in [place]. His Other issues  Patient has h/o  Colon Cancer stage T3 N1 s/p 03/24/2021, robotic assisted laparoscopic right colectomy In Remission    CAD. On Imdur No chest Pain H/o A Fib on Eliquis IAD Follows with Pulmonary  Not on Oxygen CT is stable Follows Annual CT scan Chronic Diastolic CHF, H/o HLD, Hypertension, OSA PAF on Eliquis S/P PPM  ED    Past Medical History:  Diagnosis Date   Abnormal CT scan, stomach 02/2014   Thickening of gastric fundus and cardia.  gastritis on EGD 03/2014   Allergic rhinitis due to pollen    Anemia 12/2020   Arthritis    joints,finger   Arthropathy, unspecified, site unspecified    Atherosclerosis    a. Noted by abdominal CT 02/2014 (h/o normal nuc 2011).   Atrial flutter ([place]) 02/24/2014    s/p ablation 11/15   Blood transfusion without reported diagnosis    Cardiomyopathy ([place])    tachy mediated - a. TEE (10/15):  EF 30%;  b. Echo after NSR restored (10/15):  mild LVH, EF 55-60%, mild AS, mild AI, mild MR, mild to mod LAE, mild RAE   Cataract    bilateral,removed replaced with implants   CHF (congestive heart failure) ([place]) 01/04/2021   COVID-19 11/2020   Diverticulosis    severe in descending, sigmoid colon.    ED (erectile dysfunction)    Emphysema of lung ([place])    Esophageal stricture 03/2014   traversable with endoscope. not dilated.    Gastric AVM    GERD (gastroesophageal reflux disease) 03/2014   small HH and gastritis on EGD   GI bleed    Habitual alcohol use    Hemorrhoids    Hyperlipidemia    Hypertension    Obesity    OSA on CPAP    PAF (paroxysmal atrial fibrillation) ([place])    Pneumonia 12/2020   mild with covid   Presence of permanent cardiac pacemaker    Prostatitis, unspecified    Pulmonary fibrosis (HCC)    Sinus bradycardia    a. s/p STJ dual chamber PPM   Skin cancer    basal and squamous  cell   Sleep apnea    Substance abuse ([place])    etoh    Past Surgical History:  Procedure Laterality Date   ATRIAL FLUTTER ABLATION N/A 03/19/2014   RFCA of atrial flutter by Dr Caryl Comes   CARDIOVERSION N/A 02/24/2014   Procedure: CARDIOVERSION;  Surgeon: Lelon Perla, MD;  Location: [facility];  Service: Cardiovascular;  Laterality: N/A;   CARDIOVERSION Right 09/10/2014   Procedure: CARDIOVERSION;  Surgeon: Deboraha Sprang, MD;  Location: [facility] CATH LAB;  Service: Cardiovascular;  Laterality: Right;   CARDIOVERSION N/A 01/22/2018   Procedure: CARDIOVERSION;  Surgeon: Fay Records, MD;  Location: [facility] ENDOSCOPY;  Service: Cardiovascular;  Laterality: N/A;   COLECTOMY  03/24/2021   COLONOSCOPY N/A 04/23/2015   Procedure: COLONOSCOPY;  Surgeon: Ladene Artist, MD;  Location: [facility] ENDOSCOPY;  Service: Endoscopy;  Laterality: N/A;   COLONOSCOPY      ELBOW SURGERY Right    SCREW PLACED   ENTEROSCOPY N/A 06/12/2015   Procedure: ENTEROSCOPY;  Surgeon: Ladene Artist, MD;  Location: [facility] ENDOSCOPY;  Service: Endoscopy;  Laterality: N/A;   EP IMPLANTABLE DEVICE N/A 02/15/2016   Procedure: Pacemaker Implant;  Surgeon: Evans Lance, MD;  Location: [facility] CV LAB;  Service: Cardiovascular;  Laterality: N/A;   ESOPHAGOGASTRODUODENOSCOPY N/A 04/09/2014   Procedure: ESOPHAGOGASTRODUODENOSCOPY (EGD);  Surgeon: Inda Castle, MD;  Location: [place] [facility];  Service: Endoscopy;  Laterality: N/A;   ESOPHAGOGASTRODUODENOSCOPY N/A 04/23/2015   Procedure: ESOPHAGOGASTRODUODENOSCOPY (EGD);  Surgeon: Ladene Artist, MD;  Location: [facility] ENDOSCOPY;  Service: Endoscopy;  Laterality: N/A;   INGUINAL HERNIA REPAIR     right   INGUINAL HERNIA REPAIR     left   LEFT HEART CATH AND CORONARY ANGIOGRAPHY N/A 03/16/2017   Procedure: LEFT HEART CATH AND CORONARY ANGIOGRAPHY;  Surgeon: Burnell Blanks, MD;  Location: [facility] CV LAB;  Service: Cardiovascular;  Laterality: N/A;   ORIF fracture of the elbow  1992   SKIN CANCER EXCISION  05/21/2012   Squamous cell ca   TEE WITHOUT CARDIOVERSION N/A 02/24/2014   Procedure: TRANSESOPHAGEAL ECHOCARDIOGRAM (TEE);  Surgeon: Lelon Perla, MD;  Location[facility] END[facility]COPY;  Service: Cardiovascular;  Laterality: N/A;   TONSILLECTOMY     AGE 67   UPPER GASTROINTESTINAL ENDOSCOPY      No Known Allergies  Outpatient Encounter Medications as of 07/26/2022  Medication Sig   acetaminophen (TYLENOL) 500 MG tablet Take 500 mg by mouth as needed for moderate pain or headache.   albuterol (VENTOLIN HFA) 108 (90 Base) MCG/ACT inhaler INHALE TWO PUFFS INTO THE LUNGS EVERY 6 HOURS AS NEEDED FOR WHEEZEING OR SHORTNESS OF BREATH   apixaban (ELIQUIS) 5 MG TABS tablet Take 1 tablet (5 mg total) by mouth 2 (two) times daily.   carboxymethylcellulose (REFRESH PLUS) 0.5 % SOLN Place 1 drop into both eyes daily as needed (dry  eyes).   colestipol (COLESTID) 1 g tablet Take 1 tablet (1 g total) by mouth at bedtime.   diltiazem (CARDIZEM CD) 120 MG 24 hr capsule TAKE ONE CAPSULE BY MOUTH AT BREAKFAST AND AT BEDTIME   dofetilide (TIKOSYN) 500 MCG capsule Take 1 capsule (500 mcg total) by mouth 2 (two) times daily.   furosemide (LASIX) 20 MG tablet TAKE ONE TABLET BY MOUTH ONCE DAILY   magnesium oxide (MAG-OX) 400 MG tablet TAKE ONE TABLET BY MOUTH DAILY (Patient taking differently: Take 400 mg by mouth at bedtime.)   metoprolol tartrate (LOPRESSOR) 25 MG tablet TAKE ONE TABLET  BY MOUTH EVERY MORNING AND TAKE ONE TABLET BY MOUTH EVERY DAY AT BEDTIME   Pedialyte (PEDIALYTE) SOLN Take 178 mLs by mouth daily. 6 oz   rosuvastatin (CRESTOR) 10 MG tablet TAKE ONE TABLET BY MOUTH ONCE DAILY   sildenafil (REVATIO) 20 MG tablet Take 20 mg by mouth daily as needed (erectile dysfunction).   sodium chloride (OCEAN) 0.65 % SOLN nasal spray Place 1 spray into both nostrils as needed for congestion.   Wheat Dextrin (BENEFIBER) POWD Take 1 Scoop by mouth daily.   Zoster Vaccine Adjuvanted [facility]) injection Inject into the muscle.   No facility-administered encounter medications on file as of 07/26/2022.    Review of Systems:  Review of Systems  Constitutional:  Negative for activity change, appetite change and unexpected weight change.  HENT: Negative.    Respiratory:  Negative for cough and shortness of breath.   Cardiovascular:  Negative for leg swelling.  Gastrointestinal:  Negative for constipation.  Genitourinary:  Negative for frequency.  Musculoskeletal:  Negative for arthralgias, gait problem and myalgias.  Skin: Negative.  Negative for rash.  Neurological:  Negative for dizziness and weakness.  Psychiatric/Behavioral:  Negative for confusion and sleep disturbance.   All other systems reviewed and are negative.   Health Maintenance  Topic Date Due   Medicare Annual Wellness (AWV)  04/28/2021   COVID-19 Vaccine (7  - 2023-24 season) 08/11/2022 (Originally 04/21/2022)   DTaP/Tdap/Td (3 - Td or Tdap) 04/16/2028   Pneumonia Vaccine 39+ Years old  Completed   INFLUENZA VACCINE  Completed   Zoster Vaccines- Shingrix  Completed   HPV VACCINES  Aged Out    Physical Exam: Vitals:   07/26/22 1308  BP: 122/72  Pulse: 60  Resp: 17  Temp: 98.2 F (36.8 C)  TempSrc: Temporal  SpO2: 94%  Weight: 230 lb 3.2 oz (104.4 kg)  Height: 5\' 8"  (1.727 m)   Body mass index is 35 kg/m. Physical Exam Vitals reviewed.  Constitutional:      Appearance: Normal appearance.  HENT:     Head: Normocephalic.     Nose: Nose normal.     Mouth/Throat:     Mouth: Mucous membranes are moist.     Pharynx: Oropharynx is clear.  Eyes:     Pupils: Pupils are equal, round, and reactive to light.  Cardiovascular:     Rate and Rhythm: Normal rate and regular rhythm.     Pulses: Normal pulses.     Heart sounds: No murmur heard. Pulmonary:     Effort: Pulmonary effort is normal. No respiratory distress.     Breath sounds: Normal breath sounds. No rales.  Abdominal:     General: Abdomen is flat. Bowel sounds are normal.     Palpations: Abdomen is soft.  Musculoskeletal:        General: No swelling.     Cervical back: Neck supple.  Skin:    General: Skin is warm.  Neurological:     General: No focal deficit present.     Mental Status: He is alert and oriented to person, place, and time.  Psychiatric:        Mood and Affect: Mood normal.        Thought Content: Thought content normal.       07/26/2022    1:11 PM 02/27/2018    1:34 PM 02/20/2017    8:26 AM  MMSE - Mini Mental State Exam  Orientation to time 5 5 5   Orientation to Place 5 5  5  Registration 3 3 3   Attention/ Calculation 5 5 5   Recall 3 2 2   Language- name 2 objects 2 2 2   Language- repeat 1 1 1   Language- follow 3 step command 3 3 3   Language- read & follow direction 1 1 1   Write a sentence 1 1 1   Copy design 1 1 1   Total score 30 29 29       Labs reviewed: Basic Metabolic Panel: Recent Labs    09/20/21 0751 02/14/22 0956 02/14/22 1045 04/06/22 0855 04/27/22 1538  NA 140 141  --  140  --   K 4.7 5.1  --  4.9  --   CL 103 104  --  103  --   CO2 28 28  --  25  --   GLUCOSE 144* 107*  --  106*  --   BUN 19 19  --  16  --   CREATININE 1.07 1.02 1.00 0.99  --   CALCIUM 9.6 9.6  --  9.8  --   MG 1.7  --   --   --  2.2   Liver Function Tests: Recent Labs    08/06/21 0918 08/30/21 0816 09/20/21 0751  AST 17 21 19   ALT 14 17 16   ALKPHOS 67 71 62  BILITOT 0.7 1.1 1.0  PROT 7.1 7.4 7.1  ALBUMIN 4.1 4.3 4.1   No results for input(s): "LIPASE", "AMYLASE" in the last 8760 hours. No results for input(s): "AMMONIA" in the last 8760 hours. CBC: Recent Labs    08/06/21 0918 08/30/21 0816 09/20/21 0751 04/06/22 0855  WBC 8.5 8.0 8.1 8.8  NEUTROABS 5.8 5.8 5.8  --   HGB 13.7 14.5 14.2 15.8  HCT 40.1 42.4 42.1 46.6  MCV 101.8* 103.7* 104.7* 97.9  PLT 188 188 195 221   Lipid Panel: No results for input(s): "CHOL", "HDL", "LDLCALC", "TRIG", "CHOLHDL", "LDLDIRECT" in the last 8760 hours. Lab Results  Component Value Date   HGBA1C 5.5 03/08/2021    Procedures since last visit: No results found.  Assessment/Plan  MCI Patient did very well with MMSE and passed his clock We discussed different options of further investigation for his memory We talked about referral to neurology/neurocognitive testing/MRI At his age I think patient is doing well with his cognition .  Patient and his wife  agreed.   They will come for follow-up in 6 months and we can review his symptoms. Continue exercise and mental games like puzzles and reading  Other issues  Paroxysmal atrial fibrillation (HCC) Eliquis Tikosyn and Cardizem, Metoprolol    Essential hypertension On Metoprolol   ILD (interstitial lung disease) ([place])      Coronary artery disease involving native coronary artery of native heart without angina  pectoris No Active symptoms   Other male erectile dysfunction Urology on Revatio    Hyperlipidemia with target LDL less than 100 Statin Will Need Lipid Panel   Chronic diastolic congestive heart failure (HCC) Continue Lasix Anemia Resolved  Ascending Colon Cancer s/p Resection and Chemo  Labs/tests ordered:   Next appt:  11/01/2022  Total time spent in this patient care encounter was  40_  minutes; greater than 50% of the visit spent counseling patient and staff, reviewing records , Labs and coordinating care for problems addressed at this encounter.

## 2022-08-01 DIAGNOSIS — G4733 Obstructive sleep apnea (adult) (pediatric): Secondary | ICD-10-CM | POA: Diagnosis not present

## 2022-08-16 ENCOUNTER — Ambulatory Visit (INDEPENDENT_AMBULATORY_CARE_PROVIDER_SITE_OTHER): Payer: PPO

## 2022-08-16 DIAGNOSIS — I495 Sick sinus syndrome: Secondary | ICD-10-CM | POA: Diagnosis not present

## 2022-08-17 LAB — CUP PACEART REMOTE DEVICE CHECK
Battery Remaining Longevity: 41 mo
Battery Remaining Percentage: 36 %
Battery Voltage: 2.96 V
Brady Statistic AP VP Percent: 1 %
Brady Statistic AP VS Percent: 99 %
Brady Statistic AS VP Percent: 1 %
Brady Statistic AS VS Percent: 1 %
Brady Statistic RA Percent Paced: 96 %
Brady Statistic RV Percent Paced: 1 %
Date Time Interrogation Session: 20240402020015
Implantable Lead Connection Status: 753985
Implantable Lead Connection Status: 753985
Implantable Lead Implant Date: 20171002
Implantable Lead Implant Date: 20171002
Implantable Lead Location: 753859
Implantable Lead Location: 753860
Implantable Pulse Generator Implant Date: 20171002
Lead Channel Impedance Value: 380 Ohm
Lead Channel Impedance Value: 560 Ohm
Lead Channel Pacing Threshold Amplitude: 0.625 V
Lead Channel Pacing Threshold Amplitude: 0.75 V
Lead Channel Pacing Threshold Pulse Width: 0.5 ms
Lead Channel Pacing Threshold Pulse Width: 0.5 ms
Lead Channel Sensing Intrinsic Amplitude: 12 mV
Lead Channel Sensing Intrinsic Amplitude: 2.9 mV
Lead Channel Setting Pacing Amplitude: 1.625
Lead Channel Setting Pacing Amplitude: 2.5 V
Lead Channel Setting Pacing Pulse Width: 0.5 ms
Lead Channel Setting Sensing Sensitivity: 2 mV
Pulse Gen Model: 2272
Pulse Gen Serial Number: 7951215

## 2022-08-19 ENCOUNTER — Inpatient Hospital Stay: Payer: PPO | Attending: Oncology

## 2022-08-19 ENCOUNTER — Encounter: Payer: Self-pay | Admitting: Oncology

## 2022-08-19 ENCOUNTER — Telehealth: Payer: Self-pay | Admitting: Oncology

## 2022-08-19 ENCOUNTER — Inpatient Hospital Stay: Payer: PPO | Admitting: Oncology

## 2022-08-19 VITALS — BP 119/60 | HR 75 | Temp 98.1°F | Resp 18 | Ht 68.0 in | Wt 229.0 lb

## 2022-08-19 DIAGNOSIS — C180 Malignant neoplasm of cecum: Secondary | ICD-10-CM | POA: Insufficient documentation

## 2022-08-19 DIAGNOSIS — I2510 Atherosclerotic heart disease of native coronary artery without angina pectoris: Secondary | ICD-10-CM | POA: Diagnosis not present

## 2022-08-19 DIAGNOSIS — C182 Malignant neoplasm of ascending colon: Secondary | ICD-10-CM

## 2022-08-19 DIAGNOSIS — I5032 Chronic diastolic (congestive) heart failure: Secondary | ICD-10-CM | POA: Diagnosis not present

## 2022-08-19 DIAGNOSIS — I4891 Unspecified atrial fibrillation: Secondary | ICD-10-CM | POA: Insufficient documentation

## 2022-08-19 LAB — CEA (ACCESS): CEA (CHCC): 2.02 ng/mL (ref 0.00–5.00)

## 2022-08-19 NOTE — Progress Notes (Signed)
  [facility] OFFICE PROGRESS NOTE   Diagnosis: Colon cancer  INTERVAL HISTORY:   [name] returns as scheduled.  He feels well.  Good appetite.  No difficulty with bowel function.  He developed a bruise over the abdominal wall after being struck by a piece of wood while working in a [employer].  Objective:  Vital signs in last 24 hours:  Blood pressure 119/60, pulse 75, temperature 98.1 F (36.7 C), temperature source Oral, resp. rate 18, height 5\' 8"  (1.727 m), weight 229 lb (103.9 kg), SpO2 95 %.     Lymphatics: No cervical, supraclavicular, axillary, or inguinal nodes Resp: Lungs clear bilaterally Cardio: Regular rate and rhythm GI: No hepatosplenomegaly, no mass Vascular: No leg edema   Lab Results:  Lab Results  Component Value Date   WBC 8.8 04/06/2022   HGB 15.8 04/06/2022   HCT 46.6 04/06/2022   MCV 97.9 04/06/2022   PLT 221 04/06/2022   NEUTROABS 5.8 09/20/2021    CMP  Lab Results  Component Value Date   NA 140 04/06/2022   K 4.9 04/06/2022   CL 103 04/06/2022   CO2 25 04/06/2022   GLUCOSE 106 (H) 04/06/2022   BUN 16 04/06/2022   CREATININE 0.99 04/06/2022   CALCIUM 9.8 04/06/2022   PROT 7.1 09/20/2021   ALBUMIN 4.1 09/20/2021   AST 19 09/20/2021   ALT 16 09/20/2021   ALKPHOS 62 09/20/2021   BILITOT 1.0 09/20/2021   GFRNONAA >60 04/06/2022   GFRAA >60 02/25/2019    Lab Results  Component Value Date   CEA 2.25 02/14/2022    Lab Results  Component Value Date   INR 1.52 06/08/2017   LABPROT 18.2 (H) 06/08/2017     Medications: I have reviewed the patient's current medications.   Assessment/Plan: Colon cancer-stage III (T3N1), cecum mass biopsy 02/03/2021-adenocarcinoma CTs 02/16/2021-stable interstitial lung disease, mild COPD, cardiomegaly, CAD, multiple liver cysts with a few subcentimeter too small to characterize liver lesions, circumferential wall thickening at the ileocecal valve, right lower quadrant ileocolic lymph  node measures 1.1 cm 03/24/2021, robotic assisted laparoscopic right colectomy-T3, 1/8 lymph nodes, 1 tumor deposit, lymphovascular invasion, mismatch repair protein expression intact, MSS Cycle 1 adjuvant Xeloda 04/21/2021 Cycle 2 adjuvant Xeloda 05/12/2021 Cycle 3 adjuvant Xeloda 06/07/2021 (5-day delay in start date due to foot discomfort, travel plans) Cycle 4 adjuvant Xeloda 06/29/2021, Xeloda dose reduced to 1000 mg twice daily for 14 days Cycle 5 adjuvant Xeloda 07/19/2021 Cycle 6 adjuvant Xeloda 08/09/2021 Cycle 7 adjuvant Xeloda 08/30/2021 Cycle 8 adjuvant Xeloda 09/20/2021 CTs 02/14/2022-no evidence of recurrent disease CAD Interstitial lung disease Iron deficiency anemia secondary to #1 Multiple colon polyps on the colonoscopy 02/03/2021-microscopic fragments of adenocarcinoma involving biopsies of transverse and ascending colon polyps felt to be contaminant from the cecum biopsy 2 tubular adenomas on the right colectomy specimen 03/24/2021 OSA Sick sinus syndrome-pacemaker Atrial fibrillation-maintained on apixaban Chronic diastolic CHF History of multiple skin cancers     Disposition: Donald [name] is in clinical remission from colon cancer.  He would like to continue surveillance imaging.  He will return for an office visit, CEA and surveillance imaging in 6 months.  He would like to coordinate surveillance CTs with the planned high-resolution chest CT.  Thornton Papas, MD  08/19/2022  8:22 AM

## 2022-08-19 NOTE — Telephone Encounter (Signed)
Per Doctor 4/5 los: Office week of 10/28 few days after CTs   Lab and CTs, same day week of 10/28   We were not successful in getting lab and office visit scheduled on today's appointment. Patient seemed  confused on what he needed to do. So it was advised to patient to give [place] a call when he has his CT schedule so we can schedule his lab and office visit accordingly

## 2022-08-20 ENCOUNTER — Other Ambulatory Visit: Payer: Self-pay | Admitting: Internal Medicine

## 2022-08-22 NOTE — Telephone Encounter (Signed)
Rx refill sent to pharmacy. 

## 2022-08-24 DIAGNOSIS — G4733 Obstructive sleep apnea (adult) (pediatric): Secondary | ICD-10-CM | POA: Diagnosis not present

## 2022-09-10 ENCOUNTER — Encounter: Payer: Self-pay | Admitting: Internal Medicine

## 2022-09-12 ENCOUNTER — Other Ambulatory Visit (HOSPITAL_BASED_OUTPATIENT_CLINIC_OR_DEPARTMENT_OTHER): Payer: Self-pay

## 2022-09-12 MED ORDER — SHINGRIX 50 MCG/0.5ML IM SUSR
0.5000 mL | INTRAMUSCULAR | 0 refills | Status: DC
  Filled 2022-09-12: qty 0.5, 1d supply, fill #0

## 2022-09-26 ENCOUNTER — Other Ambulatory Visit (HOSPITAL_BASED_OUTPATIENT_CLINIC_OR_DEPARTMENT_OTHER): Payer: Self-pay

## 2022-09-26 MED ORDER — COMIRNATY 30 MCG/0.3ML IM SUSY
0.3000 mL | PREFILLED_SYRINGE | INTRAMUSCULAR | 0 refills | Status: DC
  Filled 2022-09-26: qty 0.3, 1d supply, fill #0

## 2022-09-27 DIAGNOSIS — G4733 Obstructive sleep apnea (adult) (pediatric): Secondary | ICD-10-CM | POA: Diagnosis not present

## 2022-09-27 DIAGNOSIS — H04123 Dry eye syndrome of bilateral lacrimal glands: Secondary | ICD-10-CM | POA: Diagnosis not present

## 2022-09-27 DIAGNOSIS — H33311 Horseshoe tear of retina without detachment, right eye: Secondary | ICD-10-CM | POA: Diagnosis not present

## 2022-09-27 DIAGNOSIS — H35363 Drusen (degenerative) of macula, bilateral: Secondary | ICD-10-CM | POA: Diagnosis not present

## 2022-09-27 DIAGNOSIS — H35342 Macular cyst, hole, or pseudohole, left eye: Secondary | ICD-10-CM | POA: Diagnosis not present

## 2022-09-27 DIAGNOSIS — H524 Presbyopia: Secondary | ICD-10-CM | POA: Diagnosis not present

## 2022-09-28 NOTE — Progress Notes (Signed)
Remote pacemaker transmission.   

## 2022-10-08 DIAGNOSIS — G4733 Obstructive sleep apnea (adult) (pediatric): Secondary | ICD-10-CM | POA: Diagnosis not present

## 2022-10-24 ENCOUNTER — Other Ambulatory Visit (HOSPITAL_BASED_OUTPATIENT_CLINIC_OR_DEPARTMENT_OTHER): Payer: Self-pay

## 2022-10-24 ENCOUNTER — Ambulatory Visit: Payer: PPO | Attending: Internal Medicine | Admitting: Internal Medicine

## 2022-10-24 ENCOUNTER — Encounter: Payer: Self-pay | Admitting: Internal Medicine

## 2022-10-24 VITALS — BP 112/74 | HR 60 | Ht 68.0 in | Wt 228.4 lb

## 2022-10-24 DIAGNOSIS — I5032 Chronic diastolic (congestive) heart failure: Secondary | ICD-10-CM

## 2022-10-24 DIAGNOSIS — D485 Neoplasm of uncertain behavior of skin: Secondary | ICD-10-CM | POA: Diagnosis not present

## 2022-10-24 DIAGNOSIS — L821 Other seborrheic keratosis: Secondary | ICD-10-CM | POA: Diagnosis not present

## 2022-10-24 DIAGNOSIS — I480 Paroxysmal atrial fibrillation: Secondary | ICD-10-CM | POA: Diagnosis not present

## 2022-10-24 DIAGNOSIS — D692 Other nonthrombocytopenic purpura: Secondary | ICD-10-CM | POA: Diagnosis not present

## 2022-10-24 DIAGNOSIS — C44519 Basal cell carcinoma of skin of other part of trunk: Secondary | ICD-10-CM | POA: Diagnosis not present

## 2022-10-24 DIAGNOSIS — Z79899 Other long term (current) drug therapy: Secondary | ICD-10-CM

## 2022-10-24 DIAGNOSIS — D1801 Hemangioma of skin and subcutaneous tissue: Secondary | ICD-10-CM | POA: Diagnosis not present

## 2022-10-24 DIAGNOSIS — Z85828 Personal history of other malignant neoplasm of skin: Secondary | ICD-10-CM | POA: Diagnosis not present

## 2022-10-24 DIAGNOSIS — C44311 Basal cell carcinoma of skin of nose: Secondary | ICD-10-CM | POA: Diagnosis not present

## 2022-10-24 DIAGNOSIS — L570 Actinic keratosis: Secondary | ICD-10-CM | POA: Diagnosis not present

## 2022-10-24 DIAGNOSIS — Z950 Presence of cardiac pacemaker: Secondary | ICD-10-CM | POA: Diagnosis not present

## 2022-10-24 LAB — CUP PACEART INCLINIC DEVICE CHECK
Battery Remaining Longevity: 42 mo
Battery Voltage: 2.96 V
Brady Statistic RA Percent Paced: 97 %
Brady Statistic RV Percent Paced: 0.48 %
Date Time Interrogation Session: 20240610110858
Implantable Lead Connection Status: 753985
Implantable Lead Connection Status: 753985
Implantable Lead Implant Date: 20171002
Implantable Lead Implant Date: 20171002
Implantable Lead Location: 753859
Implantable Lead Location: 753860
Implantable Pulse Generator Implant Date: 20171002
Lead Channel Impedance Value: 387.5 Ohm
Lead Channel Impedance Value: 587.5 Ohm
Lead Channel Pacing Threshold Amplitude: 0.5 V
Lead Channel Pacing Threshold Amplitude: 0.5 V
Lead Channel Pacing Threshold Amplitude: 0.5 V
Lead Channel Pacing Threshold Amplitude: 0.5 V
Lead Channel Pacing Threshold Pulse Width: 0.5 ms
Lead Channel Pacing Threshold Pulse Width: 0.5 ms
Lead Channel Pacing Threshold Pulse Width: 0.5 ms
Lead Channel Pacing Threshold Pulse Width: 0.5 ms
Lead Channel Sensing Intrinsic Amplitude: 12 mV
Lead Channel Sensing Intrinsic Amplitude: 3.8 mV
Lead Channel Setting Pacing Amplitude: 1.75 V
Lead Channel Setting Pacing Amplitude: 2.5 V
Lead Channel Setting Pacing Pulse Width: 0.5 ms
Lead Channel Setting Sensing Sensitivity: 2 mV
Pulse Gen Model: 2272
Pulse Gen Serial Number: 7951215

## 2022-10-24 MED ORDER — FUROSEMIDE 20 MG PO TABS: 20.0000 mg | ORAL_TABLET | Freq: Two times a day (BID) | ORAL | 3 refills | Status: DC

## 2022-10-24 MED ORDER — FUROSEMIDE 20 MG PO TABS
20.0000 mg | ORAL_TABLET | Freq: Two times a day (BID) | ORAL | 3 refills | Status: DC
  Filled 2022-10-24: qty 180, 90d supply, fill #0
  Filled 2022-10-31: qty 60, 30d supply, fill #0
  Filled 2022-12-10: qty 60, 30d supply, fill #1
  Filled 2023-01-16: qty 60, 30d supply, fill #2
  Filled 2023-03-12: qty 60, 30d supply, fill #3
  Filled 2023-05-07: qty 60, 30d supply, fill #4
  Filled 2023-06-28: qty 60, 30d supply, fill #5
  Filled 2023-08-07: qty 60, 30d supply, fill #6
  Filled 2023-09-15: qty 60, 30d supply, fill #7

## 2022-10-24 NOTE — Progress Notes (Signed)
no     Patient Care Team: Mahlon Gammon, MD as PCP - General (Internal Medicine) Duke Salvia, MD as PCP - Cardiology (Cardiology) Duke Salvia, MD as PCP - Electrophysiology (Cardiology) Marcine Matar, MD (Urology) [place], [name], MD (Dermatology) Mateo Flow, MD as Consulting Physician (Ophthalmology) Duke Salvia, MD as Consulting Physician (Cardiology) Pyrtle, Carie Caddy, MD as Consulting Physician (Gastroenterology) Kalman Shan, MD as Consulting Physician (Pulmonary Disease) Kathyrn Sheriff, [facility] as Pharmacist (Pharmacist)   HPI  Donald [name] is a 89 y.o. male Seem in followup for persistent atrial fibrillation and Abbott  pacemaker placed 10/17 for tachybradycardia syndrome   Anticoagulated with apixaban no  bleeding   Rhythm control with dofetilide since 1/20    The patient denies chest pain, shortness of breath, nocturnal dyspnea, orthopnea.  There have been no palpitations, lightheadedness or syncope.  Complains of some edema.Marland Kitchen   He is status post surgery for stage III colon cancer.  11/22.  Imodium is okay to   DATE TEST EF   10/15    Echo  55 %  mild AS ( mean gradient 10) --? Bicuspid aortic valve  9/16 myoview 60-65%   mild ischemia  10/17 Echo 60-65%  No comment on bicuspid valve//mild - Mod  AI   11/18 Cath 55-65% Mod non obstructive CAD   1/20 Echo 60-65% LAE (52/2.5/45)  10/22 Echo   60-65% BAE     Date Cr K Mg Hgb TSH  10/17 0.97  4.6  13.2   0.56  4/18 1.07 5.2  13.9   1/19 1.26 5.1  14 0.46  1/20 1.04 4.7 2.1 13.6   10/20 0.97 4.8 2.0 14.0 0.68  3/23 1.00 4.5 1.7 (5/23) 13.7 1.9 (9/22)  11/23 0.99 4.9   15.8     thromboembolic risk factors ( age  -2, HTN-1, ) for a CHADSVASc Score of 3       Past Medical History:  Diagnosis Date   Abnormal CT scan, stomach 02/2014   Thickening of gastric fundus and cardia.  gastritis on EGD 03/2014   Allergic rhinitis due to pollen    Anemia 12/2020   Arthritis    joints,finger    Arthropathy, unspecified, site unspecified    Atherosclerosis    a. Noted by abdominal CT 02/2014 (h/o normal nuc 2011).   Atrial flutter (HCC) 02/24/2014   s/p ablation 11/15   Blood transfusion without reported diagnosis    Cardiomyopathy (HCC)    tachy mediated - a. TEE (10/15):  EF 30%;  b. Echo after NSR restored (10/15):  mild LVH, EF 55-60%, mild AS, mild AI, mild MR, mild to mod LAE, mild RAE   Cataract    bilateral,removed replaced with implants   CHF (congestive heart failure) (HCC) 01/04/2021   COVID-19 11/2020   Diverticulosis    severe in descending, sigmoid colon.    ED (erectile dysfunction)    Emphysema of lung (HCC)    Esophageal stricture 03/2014   traversable with endoscope. not dilated.    Gastric AVM    GERD (gastroesophageal reflux disease) 03/2014   small HH and gastritis on EGD   GI bleed    Habitual alcohol use    Hemorrhoids    Hyperlipidemia    Hypertension    Obesity    OSA on CPAP    PAF (paroxysmal atrial fibrillation) (HCC)    Pneumonia 12/2020   mild with covid   Presence of permanent cardiac pacemaker  Prostatitis, unspecified    Pulmonary fibrosis (HCC)    Sinus bradycardia    a. s/p STJ dual chamber PPM   Skin cancer    basal and squamous cell   Sleep apnea    Substance abuse (HCC)    etoh    Past Surgical History:  Procedure Laterality Date   ATRIAL FLUTTER ABLATION N/A 03/19/2014   RFCA of atrial flutter by Dr Graciela Husbands   CARDIOVERSION N/A 02/24/2014   Procedure: CARDIOVERSION;  Surgeon: Lewayne Bunting, MD;  Location: [facility] ENDOSCOPY;  Service: Cardiovascular;  Laterality: N/A;   CARDIOVERSION Right 09/10/2014   Procedure: CARDIOVERSION;  Surgeon: Duke Salvia, MD;  Location: [facility] CATH LAB;  Service: Cardiovascular;  Laterality: Right;   CARDIOVERSION N/A 01/22/2018   Procedure: CARDIOVERSION;  Surgeon: Pricilla Riffle, MD;  Location: [facility] ENDOSCOPY;  Service: Cardiovascular;  Laterality: N/A;   COLECTOMY  03/24/2021   COLONOSCOPY  N/A 04/23/2015   Procedure: COLONOSCOPY;  Surgeon: Meryl Dare, MD;  Location: [facility] ENDOSCOPY;  Service: Endoscopy;  Laterality: N/A;   COLONOSCOPY     ELBOW SURGERY Right    SCREW PLACED   ENTEROSCOPY N/A 06/12/2015   Procedure: ENTEROSCOPY;  Surgeon: Meryl Dare, MD;  Location: [facility] ENDOSCOPY;  Service: Endoscopy;  Laterality: N/A;   EP IMPLANTABLE DEVICE N/A 02/15/2016   Procedure: Pacemaker Implant;  Surgeon: Marinus Maw, MD;  Location: [facility] INVASIVE CV LAB;  Service: Cardiovascular;  Laterality: N/A;   ESOPHAGOGASTRODUODENOSCOPY N/A 04/09/2014   Procedure: ESOPHAGOGASTRODUODENOSCOPY (EGD);  Surgeon: Louis Meckel, MD;  Location: [facility] ENDOSCOPY;  Service: Endoscopy;  Laterality: N/A;   ESOPHAGOGASTRODUODENOSCOPY N/A 04/23/2015   Procedure: ESOPHAGOGASTRODUODENOSCOPY (EGD);  Surgeon: Meryl Dare, MD;  Location: [facility] ENDOSCOPY;  Service: Endoscopy;  Laterality: N/A;   INGUINAL HERNIA REPAIR     right   INGUINAL HERNIA REPAIR     left   LEFT HEART CATH AND CORONARY ANGIOGRAPHY N/A 03/16/2017   Procedure: LEFT HEART CATH AND CORONARY ANGIOGRAPHY;  Surgeon: Kathleene Hazel, MD;  Location: [facility] INVASIVE CV LAB;  Service: Cardiovascular;  Laterality: N/A;   ORIF fracture of the elbow  1992   SKIN CANCER EXCISION  05/21/2012   Squamous cell ca   TEE WITHOUT CARDIOVERSION N/A 02/24/2014   Procedure: TRANSESOPHAGEAL ECHOCARDIOGRAM (TEE);  Surgeon: Lewayne Bunting, MD;  Location: [facility] ENDOSCOPY;  Service: Cardiovascular;  Laterality: N/A;   TONSILLECTOMY     AGE 41   UPPER GASTROINTESTINAL ENDOSCOPY      Current Outpatient Medications  Medication Sig Dispense Refill   acetaminophen (TYLENOL) 500 MG tablet Take 500 mg by mouth as needed for moderate pain or headache.     albuterol (VENTOLIN HFA) 108 (90 Base) MCG/ACT inhaler INHALE TWO PUFFS INTO THE LUNGS EVERY 6 HOURS AS NEEDED FOR WHEEZEING OR SHORTNESS OF BREATH 8.5 g 3   apixaban (ELIQUIS) 5 MG TABS tablet Take 1 tablet (5 mg  total) by mouth 2 (two) times daily. 60 tablet 5   carboxymethylcellulose (REFRESH PLUS) 0.5 % SOLN Place 1 drop into both eyes daily as needed (dry eyes).     COVID-19 mRNA vaccine 2023-2024 (COMIRNATY) syringe Inject 0.3 mLs into the muscle. 0.3 mL 0   diltiazem (CARDIZEM CD) 120 MG 24 hr capsule TAKE ONE CAPSULE BY MOUTH AT BREAKFAST AND AT BEDTIME 180 capsule 3   dofetilide (TIKOSYN) 500 MCG capsule Take 1 capsule (500 mcg total) by mouth 2 (two) times daily. 180 capsule 3   furosemide (LASIX) 20 MG  tablet TAKE ONE TABLET BY MOUTH ONCE DAILY 90 tablet 3   magnesium oxide (MAG-OX) 400 MG tablet TAKE ONE TABLET BY MOUTH DAILY (Patient taking differently: Take 400 mg by mouth at bedtime.) 30 tablet 6   metoprolol tartrate (LOPRESSOR) 25 MG tablet Take 1 tablet (25 mg total) by mouth 2 (two) times daily. 60 tablet 8   metroNIDAZOLE (METROGEL) 0.75 % gel Apply 1 Application topically 2 (two) times daily as needed. To nose     Pedialyte (PEDIALYTE) SOLN Take 178 mLs by mouth daily. 6 oz     rosuvastatin (CRESTOR) 10 MG tablet TAKE ONE TABLET BY MOUTH ONCE DAILY 90 tablet 3   sildenafil (REVATIO) 20 MG tablet Take 20 mg by mouth daily as needed (erectile dysfunction).     sodium chloride (OCEAN) 0.65 % SOLN nasal spray Place 1 spray into both nostrils as needed for congestion.     No current facility-administered medications for this visit.    No Known Allergies  Review of Systems negative except from HPI and PMH  Physical Exam BP 112/74   Pulse 60   Ht 5\' 8"  (1.727 m)   Wt 228 lb 6.4 oz (103.6 kg)   SpO2 96%   BMI 34.73 kg/m  Well developed and well nourished in no acute distress HENT normal Neck supple with JVP-flat Clear Device pocket well healed; without hematoma or erythema.  There is no tethering  Regular rate and rhythm, no  murmur Abd-soft with active BS No Clubbing cyanosis 1+ edema Skin-warm and dry A & Oriented  Grossly normal sensory and motor function  ECG atrial  pacing at 60 Interval 16/09/44  Device function is normal. Programming changes   See Paceart for details          Assessment and  Plan  Atrial fibrillation/flutter-paroxysmal  CHADS-VASc score 5 (age-39 hypertension-1 cardiomyopathy-1 vascular disease-1)  Aortic Valve Bicuspid previously described but no longer evident  Cardiomyopathy-resolved  Pacemaker-St. Jude     Hypertension  HFpEF-chronic  Exertional dyspnea Cath non obstructive disease  Dofetilide   High Risk Medication Surveillance  Chronic diarrhea    Holding sinus rhythm; continue dofetilide; labs stable 4/24; anticoagulation with apixaban.  Will need to check a CBC  With diarrhea, following his colon cancer surgery, has been taking Imodium.  Both it and Kaopectate have QT prolonging properties and are contraindicated with dofetilide  Given his edema, we will have him try a twice daily furosemide for 4 days out of the next week   Blood pressure well-controlled.  Continue the diltiazem and the metoprolol

## 2022-10-24 NOTE — Patient Instructions (Addendum)
Medication Instructions:  Your physician has recommended you make the following change in your medication:   Increase Furosemide 20mg  - to 1 tablet by mouth twice daily.  *If you need a refill on your cardiac medications before your next appointment, please call your pharmacy*   Lab Work:  CBC today  If you have labs (blood work) drawn today and your tests are completely normal, you will receive your results only by: MyChart Message (if you have MyChart) OR A paper copy in the mail If you have any lab test that is abnormal or we need to change your treatment, we will call you to review the results.   Testing/Procedures: .none    Follow-Up: At [facility], you and your health needs are our priority.  As part of our continuing mission to provide you with exceptional heart care, we have created designated Provider Care Teams.  These Care Teams include your primary Cardiologist (physician) and Advanced Practice Providers (APPs -  Physician Assistants and Nurse Practitioners) who all work together to provide you with the care you need, when you need it.  We recommend signing up for the patient portal called "MyChart".  Sign up information is provided on this After Visit Summary.  MyChart is used to connect with patients for Virtual Visits (Telemedicine).  Patients are able to view lab/test results, encounter notes, upcoming appointments, etc.  Non-urgent messages can be sent to your provider as well.   To learn more about what you can do with MyChart, go to [email or web address].    Your next appointment:   6 months with Dr Graciela Husbands

## 2022-10-25 LAB — CBC
Hematocrit: 45 % (ref 37.5–51.0)
Hemoglobin: 14.9 g/dL (ref 13.0–17.7)
MCH: 31.5 pg (ref 26.6–33.0)
MCHC: 33.1 g/dL (ref 31.5–35.7)
MCV: 95 fL (ref 79–97)
Platelets: 186 10*3/uL (ref 150–450)
RBC: 4.73 x10E6/uL (ref 4.14–5.80)
RDW: 12.7 % (ref 11.6–15.4)
WBC: 9.6 10*3/uL (ref 3.4–10.8)

## 2022-10-26 ENCOUNTER — Other Ambulatory Visit (HOSPITAL_BASED_OUTPATIENT_CLINIC_OR_DEPARTMENT_OTHER): Payer: Self-pay

## 2022-10-31 ENCOUNTER — Other Ambulatory Visit (HOSPITAL_BASED_OUTPATIENT_CLINIC_OR_DEPARTMENT_OTHER): Payer: Self-pay

## 2022-10-31 ENCOUNTER — Encounter: Payer: Self-pay | Admitting: Internal Medicine

## 2022-11-01 ENCOUNTER — Non-Acute Institutional Stay: Payer: PPO | Admitting: Internal Medicine

## 2022-11-01 ENCOUNTER — Encounter: Payer: Self-pay | Admitting: Internal Medicine

## 2022-11-01 ENCOUNTER — Other Ambulatory Visit (HOSPITAL_BASED_OUTPATIENT_CLINIC_OR_DEPARTMENT_OTHER): Payer: Self-pay

## 2022-11-01 ENCOUNTER — Other Ambulatory Visit: Payer: Self-pay

## 2022-11-01 VITALS — BP 126/72 | HR 60 | Temp 97.8°F | Resp 17 | Ht 68.0 in | Wt 227.5 lb

## 2022-11-01 DIAGNOSIS — I480 Paroxysmal atrial fibrillation: Secondary | ICD-10-CM | POA: Diagnosis not present

## 2022-11-01 DIAGNOSIS — I10 Essential (primary) hypertension: Secondary | ICD-10-CM

## 2022-11-01 DIAGNOSIS — I5032 Chronic diastolic (congestive) heart failure: Secondary | ICD-10-CM | POA: Diagnosis not present

## 2022-11-01 DIAGNOSIS — E785 Hyperlipidemia, unspecified: Secondary | ICD-10-CM | POA: Diagnosis not present

## 2022-11-01 DIAGNOSIS — I2510 Atherosclerotic heart disease of native coronary artery without angina pectoris: Secondary | ICD-10-CM | POA: Diagnosis not present

## 2022-11-01 DIAGNOSIS — R195 Other fecal abnormalities: Secondary | ICD-10-CM | POA: Diagnosis not present

## 2022-11-01 DIAGNOSIS — I4819 Other persistent atrial fibrillation: Secondary | ICD-10-CM

## 2022-11-01 DIAGNOSIS — G3184 Mild cognitive impairment, so stated: Secondary | ICD-10-CM | POA: Diagnosis not present

## 2022-11-01 MED ORDER — METOPROLOL TARTRATE 25 MG PO TABS
25.0000 mg | ORAL_TABLET | Freq: Two times a day (BID) | ORAL | 3 refills | Status: DC
  Filled 2022-11-01 – 2022-11-05 (×2): qty 180, 90d supply, fill #0
  Filled 2023-02-12: qty 180, 90d supply, fill #1
  Filled 2023-05-17: qty 180, 90d supply, fill #2
  Filled 2023-08-07: qty 180, 90d supply, fill #3

## 2022-11-01 MED ORDER — DOFETILIDE 500 MCG PO CAPS
500.0000 ug | ORAL_CAPSULE | Freq: Two times a day (BID) | ORAL | 3 refills | Status: DC
  Filled 2022-11-01 – 2022-11-14 (×2): qty 180, 90d supply, fill #0
  Filled 2023-02-06: qty 180, 90d supply, fill #1
  Filled 2023-05-07: qty 180, 90d supply, fill #2
  Filled 2023-08-07: qty 180, 90d supply, fill #3

## 2022-11-01 MED ORDER — METOPROLOL TARTRATE 25 MG PO TABS: 25.0000 mg | ORAL_TABLET | Freq: Two times a day (BID) | ORAL | 3 refills | Status: DC

## 2022-11-01 MED ORDER — DILTIAZEM HCL ER COATED BEADS 120 MG PO CP24
120.0000 mg | ORAL_CAPSULE | Freq: Two times a day (BID) | ORAL | 3 refills | Status: DC
  Filled 2022-11-01 – 2022-11-14 (×2): qty 180, 90d supply, fill #0
  Filled 2023-02-12: qty 180, 90d supply, fill #1
  Filled 2023-05-17: qty 180, 90d supply, fill #2
  Filled 2023-08-07: qty 180, 90d supply, fill #3

## 2022-11-01 MED ORDER — APIXABAN 5 MG PO TABS
5.0000 mg | ORAL_TABLET | Freq: Two times a day (BID) | ORAL | 5 refills | Status: DC
Start: 2022-11-01 — End: 2023-05-17
  Filled 2022-11-01 – 2022-11-14 (×2): qty 60, 30d supply, fill #0
  Filled 2022-12-10: qty 60, 30d supply, fill #1
  Filled 2023-01-16: qty 60, 30d supply, fill #2
  Filled 2023-02-12: qty 60, 30d supply, fill #3
  Filled 2023-03-12 – 2023-03-14 (×2): qty 60, 30d supply, fill #4
  Filled 2023-04-17: qty 60, 30d supply, fill #5

## 2022-11-01 NOTE — Addendum Note (Signed)
Addended by: Mariam Dollar on: 11/01/2022 08:58 AM   Modules accepted: Orders

## 2022-11-01 NOTE — Progress Notes (Signed)
Location: [facility] features editor of Service:  Clinic (12)  Provider:   Code Status:  Goals of Care:     11/01/2022   12:58 PM  Advanced Directives  Does Patient Have a Medical Advance Directive? Yes  Type of Estate agent of [place];Living will;Out of facility DNR (pink MOST or yellow form)  Does patient want to make changes to medical advance directive? No - Patient declined     Chief Complaint  Patient presents with   Medical Management of Chronic Issues    Patient is being seen for A 3 month follow up.   Quality Metric Gaps    Discussed the need for AWV    HPI: Patient is a 89 y.o. male seen today for an acute visit for Follow up for Cognition [place] in [place]   Mild Cognitive issues MMSE 30/30 Forgets Names Independent in his ADLS and IADLS Still driving  No Change since last visit While discussing his Cognition he also admitted about his excessive alcohol drinking in the evening And Wonder if that is causing his Memory Issues  Also Has h/o  Colon Cancer stage T3 N1 s/p 03/24/2021, robotic assisted laparoscopic right colectomy In Remission  CAD. On Imdur No chest Pain H/o A Fib on Eliquis IAD Follows with Pulmonary  Not on Oxygen CT is stable Follows Annual CT scan Chronic Diastolic CHF, H/o HLD, Hypertension, OSA PAF on Eliquis S/P PPM  ED     Past Medical History:  Diagnosis Date   Abnormal CT scan, stomach 02/2014   Thickening of gastric fundus and cardia.  gastritis on EGD 03/2014   Allergic rhinitis due to pollen    Anemia 12/2020   Arthritis    joints,finger   Arthropathy, unspecified, site unspecified    Atherosclerosis    a. Noted by abdominal CT 02/2014 (h/o normal nuc 2011).   Atrial flutter (HCC) 02/24/2014   s/p ablation 11/15   Blood transfusion without reported diagnosis    Cardiomyopathy (HCC)    tachy mediated - a. TEE (10/15):  EF 30%;  b. Echo after NSR restored (10/15):  mild  LVH, EF 55-60%, mild AS, mild AI, mild MR, mild to mod LAE, mild RAE   Cataract    bilateral,removed replaced with implants   CHF (congestive heart failure) (HCC) 01/04/2021   COVID-19 11/2020   Diverticulosis    severe in descending, sigmoid colon.    ED (erectile dysfunction)    Emphysema of lung (HCC)    Esophageal stricture 03/2014   traversable with endoscope. not dilated.    Gastric AVM    GERD (gastroesophageal reflux disease) 03/2014   small HH and gastritis on EGD   GI bleed    Habitual alcohol use    Hemorrhoids    Hyperlipidemia    Hypertension    Obesity    OSA on CPAP    PAF (paroxysmal atrial fibrillation) (HCC)    Pneumonia 12/2020   mild with covid   Presence of permanent cardiac pacemaker    Prostatitis, unspecified    Pulmonary fibrosis (HCC)    Sinus bradycardia    a. s/p STJ dual chamber PPM   Skin cancer    basal and squamous cell   Sleep apnea    Substance abuse (HCC)    etoh    Past Surgical History:  Procedure Laterality Date   ATRIAL FLUTTER ABLATION N/A 03/19/2014   RFCA of atrial flutter by Dr Graciela Husbands  CARDIOVERSION N/A 02/24/2014   Procedure: CARDIOVERSION;  Surgeon: Lewayne Bunting, MD;  Location: [facility] ENDOSCOPY;  Service: Cardiovascular;  Laterality: N/A;   CARDIOVERSION Right 09/10/2014   Procedure: CARDIOVERSION;  Surgeon: Duke Salvia, MD;  Location: [facility] CATH LAB;  Service: Cardiovascular;  Laterality: Right;   CARDIOVERSION N/A 01/22/2018   Procedure: CARDIOVERSION;  Surgeon: Pricilla Riffle, MD;  Location: [facility] ENDOSCOPY;  Service: Cardiovascular;  Laterality: N/A;   COLECTOMY  03/24/2021   COLONOSCOPY N/A 04/23/2015   Procedure: COLONOSCOPY;  Surgeon: Meryl Dare, MD;  Location: [facility] ENDOSCOPY;  Service: Endoscopy;  Laterality: N/A;   COLONOSCOPY     ELBOW SURGERY Right    SCREW PLACED   ENTEROSCOPY N/A 06/12/2015   Procedure: ENTEROSCOPY;  Surgeon: Meryl Dare, MD;  Location: [facility] ENDOSCOPY;  Service: Endoscopy;  Laterality:  N/A;   EP IMPLANTABLE DEVICE N/A 02/15/2016   Procedure: Pacemaker Implant;  Surgeon: Marinus Maw, MD;  Location: [facility] INVASIVE CV LAB;  Service: Cardiovascular;  Laterality: N/A;   ESOPHAGOGASTRODUODENOSCOPY N/A 04/09/2014   Procedure: ESOPHAGOGASTRODUODENOSCOPY (EGD);  Surgeon: Louis Meckel, MD;  Location: [facility] ENDOSCOPY;  Service: Endoscopy;  Laterality: N/A;   ESOPHAGOGASTRODUODENOSCOPY N/A 04/23/2015   Procedure: ESOPHAGOGASTRODUODENOSCOPY (EGD);  Surgeon: Meryl Dare, MD;  Location: [facility] ENDOSCOPY;  Service: Endoscopy;  Laterality: N/A;   INGUINAL HERNIA REPAIR     right   INGUINAL HERNIA REPAIR     left   LEFT HEART CATH AND CORONARY ANGIOGRAPHY N/A 03/16/2017   Procedure: LEFT HEART CATH AND CORONARY ANGIOGRAPHY;  Surgeon: Kathleene Hazel, MD;  Location: [facility] INVASIVE CV LAB;  Service: Cardiovascular;  Laterality: N/A;   ORIF fracture of the elbow  1992   SKIN CANCER EXCISION  05/21/2012   Squamous cell ca   TEE WITHOUT CARDIOVERSION N/A 02/24/2014   Procedure: TRANSESOPHAGEAL ECHOCARDIOGRAM (TEE);  Surgeon: Lewayne Bunting, MD;  Location: [facility] ENDOSCOPY;  Service: Cardiovascular;  Laterality: N/A;   TONSILLECTOMY     AGE 55   UPPER GASTROINTESTINAL ENDOSCOPY      No Known Allergies  Outpatient Encounter Medications as of 11/01/2022  Medication Sig   acetaminophen (TYLENOL) 500 MG tablet Take 500 mg by mouth as needed for moderate pain or headache.   albuterol (VENTOLIN HFA) 108 (90 Base) MCG/ACT inhaler INHALE TWO PUFFS INTO THE LUNGS EVERY 6 HOURS AS NEEDED FOR WHEEZEING OR SHORTNESS OF BREATH   apixaban (ELIQUIS) 5 MG TABS tablet Take 1 tablet (5 mg total) by mouth 2 (two) times daily.   carboxymethylcellulose (REFRESH PLUS) 0.5 % SOLN Place 1 drop into both eyes daily as needed (dry eyes).   diltiazem (CARDIZEM CD) 120 MG 24 hr capsule Take 1 capsule (120 mg total) by mouth 2 (two) times daily, at breakfast and at bedtime.   dofetilide (TIKOSYN) 500 MCG capsule Take  1 capsule (500 mcg total) by mouth 2 (two) times daily.   furosemide (LASIX) 20 MG tablet Take 1 tablet (20 mg total) by mouth 2 (two) times daily.   magnesium oxide (MAG-OX) 400 MG tablet TAKE ONE TABLET BY MOUTH DAILY (Patient taking differently: Take 400 mg by mouth at bedtime.)   metoprolol tartrate (LOPRESSOR) 25 MG tablet Take 1 tablet (25 mg total) by mouth 2 (two) times daily.   metroNIDAZOLE (METROGEL) 0.75 % gel Apply 1 Application topically 2 (two) times daily as needed. To nose   Pedialyte (PEDIALYTE) SOLN Take 178 mLs by mouth daily. 6 oz   rosuvastatin (CRESTOR) 10 MG tablet  TAKE ONE TABLET BY MOUTH ONCE DAILY   sildenafil (REVATIO) 20 MG tablet Take 20 mg by mouth daily as needed (erectile dysfunction).   sodium chloride (OCEAN) 0.65 % SOLN nasal spray Place 1 spray into both nostrils as needed for congestion.   [DISCONTINUED] COVID-19 mRNA vaccine 2023-2024 (COMIRNATY) syringe Inject 0.3 mLs into the muscle.   No facility-administered encounter medications on file as of 11/01/2022.    Review of Systems:  Review of Systems  Constitutional:  Negative for activity change, appetite change and unexpected weight change.  HENT: Negative.    Respiratory:  Negative for cough and shortness of breath.   Cardiovascular:  Positive for leg swelling.  Gastrointestinal:  Negative for constipation.  Genitourinary:  Negative for frequency.  Musculoskeletal:  Negative for arthralgias, gait problem and myalgias.  Skin: Negative.  Negative for rash.  Neurological:  Negative for dizziness and weakness.  Psychiatric/Behavioral:  Negative for confusion and sleep disturbance.   All other systems reviewed and are negative.   Health Maintenance  Topic Date Due   Medicare Annual Wellness (AWV)  04/28/2021   COVID-19 Vaccine (8 - 2023-24 season) 11/21/2022   INFLUENZA VACCINE  12/15/2022   DTaP/Tdap/Td (3 - Td or Tdap) 04/16/2028   Pneumonia Vaccine 90+ Years old  Completed   Zoster Vaccines-  Shingrix  Completed   HPV VACCINES  Aged Out    Physical Exam: Vitals:   11/01/22 1253  BP: 126/72  Pulse: 60  Resp: 17  Temp: 97.8 F (36.6 C)  TempSrc: Temporal  SpO2: 96%  Weight: 227 lb 8 oz (103.2 kg)  Height: 5\' 8"  (1.727 m)   Body mass index is 34.59 kg/m. Physical Exam Vitals reviewed.  Constitutional:      Appearance: Normal appearance.  HENT:     Head: Normocephalic.     Nose: Nose normal.     Mouth/Throat:     Mouth: Mucous membranes are moist.     Pharynx: Oropharynx is clear.  Eyes:     Pupils: Pupils are equal, round, and reactive to light.  Cardiovascular:     Rate and Rhythm: Normal rate and regular rhythm.     Pulses: Normal pulses.     Heart sounds: No murmur heard. Pulmonary:     Effort: Pulmonary effort is normal. No respiratory distress.     Breath sounds: Normal breath sounds. No rales.  Abdominal:     General: Abdomen is flat. Bowel sounds are normal.     Palpations: Abdomen is soft.  Musculoskeletal:        General: Swelling present.     Cervical back: Neck supple.     Comments: Mild Swelling Bilateral with Chronic Venous changes  Skin:    General: Skin is warm.  Neurological:     General: No focal deficit present.     Mental Status: He is alert and oriented to person, place, and time.  Psychiatric:        Mood and Affect: Mood normal.        Thought Content: Thought content normal.     Labs reviewed: Basic Metabolic Panel: Recent Labs    02/14/22 0956 02/14/22 1045 04/06/22 0855 04/27/22 1538  NA 141  --  140  --   K 5.1  --  4.9  --   CL 104  --  103  --   CO2 28  --  25  --   GLUCOSE 107*  --  106*  --   BUN 19  --  16  --   CREATININE 1.02 1.00 0.99  --   CALCIUM 9.6  --  9.8  --   MG  --   --   --  2.2   Liver Function Tests: No results for input(s): "AST", "ALT", "ALKPHOS", "BILITOT", "PROT", "ALBUMIN" in the last 8760 hours. No results for input(s): "LIPASE", "AMYLASE" in the last 8760 hours. No results for  input(s): "AMMONIA" in the last 8760 hours. CBC: Recent Labs    04/06/22 0855 10/24/22 1139  WBC 8.8 9.6  HGB 15.8 14.9  HCT 46.6 45.0  MCV 97.9 95  PLT 221 186   Lipid Panel: No results for input(s): "CHOL", "HDL", "LDLCALC", "TRIG", "CHOLHDL", "LDLDIRECT" in the last 8760 hours. Lab Results  Component Value Date   HGBA1C 5.5 03/08/2021    Procedures since last visit: CUP PACEART INCLINIC DEVICE CHECK  Result Date: 10/24/2022 Pacemaker check in clinic. Normal device function. Thresholds, sensing, impedances consistent with previous measurements. Device programmed to maximize longevity. Afib burden 1.7%. Device programmed at appropriate safety margins. Histogram distribution appropriate for patient activity level. Device programmed to optimize intrinsic conduction. Estimated longevity 3.2-3.5 years. Patient enrolled in remote follow-up. Patient education completed.Ancil Boozer, BSN, RN   Assessment/Plan 1. Mild cognitive impairment Continues to manage well We discussed about Going to Neurology if any worsening We also discussed Reducing Alcohol intake will help with his Cognition   2. Paroxysmal atrial fibrillation (HCC) Eliquis Tikosyn and Cardizem, Metoprolol   3. Essential hypertension On Metoprolol and Cardizem  4. Coronary artery disease  No active symptoms  5. Hyperlipidemia with target LDL less than 100 Crestor, Will Check his LDL before next visit  6. Chronic diastolic congestive heart failure (HCC) On Lasix recently increased by Cardiology  7. Loose stools Cannot use Imodium due to Prolong QT on it Did not tolerate Colestipol as size of Pill was large We discussed Questran but he wants to wait 8 Anemia Resolved 9 Ascending Colon Cancer s/p Resection and Chem  In remission  10 Other male erectile dysfunction Urology on Revatio 11 ILD  No Meds per Pulmonary 12 OSA On CPAP Labs/tests ordered:  CMP,TSH LIpid Next appt:  11/28/2022

## 2022-11-01 NOTE — Telephone Encounter (Signed)
Pt last saw Dr Graciela Husbands 10/24/22, last labs 04/06/22 Creat 0.99, age 89, weight 103.6kg, based on specified criteria pt is on appropriate dosage of Eliquis 5mg  BID for afib.  Will refill rx.

## 2022-11-03 DIAGNOSIS — G4733 Obstructive sleep apnea (adult) (pediatric): Secondary | ICD-10-CM | POA: Diagnosis not present

## 2022-11-05 ENCOUNTER — Other Ambulatory Visit (HOSPITAL_BASED_OUTPATIENT_CLINIC_OR_DEPARTMENT_OTHER): Payer: Self-pay

## 2022-11-14 ENCOUNTER — Other Ambulatory Visit (HOSPITAL_BASED_OUTPATIENT_CLINIC_OR_DEPARTMENT_OTHER): Payer: Self-pay

## 2022-11-15 ENCOUNTER — Ambulatory Visit (INDEPENDENT_AMBULATORY_CARE_PROVIDER_SITE_OTHER): Payer: PPO

## 2022-11-15 DIAGNOSIS — I495 Sick sinus syndrome: Secondary | ICD-10-CM | POA: Diagnosis not present

## 2022-11-15 LAB — CUP PACEART REMOTE DEVICE CHECK
Battery Remaining Longevity: 39 mo
Battery Remaining Percentage: 34 %
Battery Voltage: 2.96 V
Brady Statistic AP VP Percent: 1 %
Brady Statistic AP VS Percent: 99 %
Brady Statistic AS VP Percent: 1 %
Brady Statistic AS VS Percent: 1 %
Brady Statistic RA Percent Paced: 99 %
Brady Statistic RV Percent Paced: 1 %
Date Time Interrogation Session: 20240702020014
Implantable Lead Connection Status: 753985
Implantable Lead Connection Status: 753985
Implantable Lead Implant Date: 20171002
Implantable Lead Implant Date: 20171002
Implantable Lead Location: 753859
Implantable Lead Location: 753860
Implantable Pulse Generator Implant Date: 20171002
Lead Channel Impedance Value: 430 Ohm
Lead Channel Impedance Value: 590 Ohm
Lead Channel Pacing Threshold Amplitude: 0.5 V
Lead Channel Pacing Threshold Amplitude: 0.5 V
Lead Channel Pacing Threshold Pulse Width: 0.5 ms
Lead Channel Pacing Threshold Pulse Width: 0.5 ms
Lead Channel Sensing Intrinsic Amplitude: 12 mV
Lead Channel Sensing Intrinsic Amplitude: 4.4 mV
Lead Channel Setting Pacing Amplitude: 1.5 V
Lead Channel Setting Pacing Amplitude: 2.5 V
Lead Channel Setting Pacing Pulse Width: 0.5 ms
Lead Channel Setting Sensing Sensitivity: 2 mV
Pulse Gen Model: 2272
Pulse Gen Serial Number: 7951215

## 2022-11-21 ENCOUNTER — Encounter: Payer: Self-pay | Admitting: Internal Medicine

## 2022-11-21 ENCOUNTER — Other Ambulatory Visit: Payer: Self-pay | Admitting: Internal Medicine

## 2022-11-21 MED ORDER — ROSUVASTATIN CALCIUM 10 MG PO TABS
10.0000 mg | ORAL_TABLET | Freq: Every day | ORAL | 3 refills | Status: DC
  Filled 2022-11-21: qty 90, 90d supply, fill #0
  Filled 2023-02-19: qty 90, 90d supply, fill #1
  Filled 2023-05-17: qty 90, 90d supply, fill #2
  Filled 2023-08-13: qty 90, 90d supply, fill #3

## 2022-11-22 ENCOUNTER — Other Ambulatory Visit: Payer: Self-pay

## 2022-11-22 ENCOUNTER — Other Ambulatory Visit (HOSPITAL_BASED_OUTPATIENT_CLINIC_OR_DEPARTMENT_OTHER): Payer: Self-pay

## 2022-11-28 ENCOUNTER — Non-Acute Institutional Stay: Payer: PPO | Admitting: Adult Health

## 2022-11-28 ENCOUNTER — Encounter: Payer: Self-pay | Admitting: Adult Health

## 2022-11-28 ENCOUNTER — Other Ambulatory Visit (HOSPITAL_BASED_OUTPATIENT_CLINIC_OR_DEPARTMENT_OTHER): Payer: Self-pay

## 2022-11-28 VITALS — BP 120/72 | HR 71 | Temp 98.7°F | Resp 17 | Ht 68.0 in | Wt 227.2 lb

## 2022-11-28 DIAGNOSIS — Z0000 Encounter for general adult medical examination without abnormal findings: Secondary | ICD-10-CM

## 2022-11-28 MED ORDER — COLESTIPOL HCL 5 G PO GRAN
5.0000 g | GRANULES | Freq: Every day | ORAL | 12 refills | Status: DC
  Filled 2022-11-28: qty 500, fill #0
  Filled 2022-11-29: qty 500, 100d supply, fill #0
  Filled 2022-11-29: qty 500, fill #0
  Filled 2023-03-10: qty 500, 100d supply, fill #1
  Filled 2023-04-10: qty 500, 100d supply, fill #0
  Filled 2023-07-14: qty 500, 100d supply, fill #1
  Filled 2023-11-06: qty 500, 100d supply, fill #2

## 2022-11-28 NOTE — Patient Instructions (Signed)
[name] , Thank you for taking time to come for your Medicare Wellness Visit. I appreciate your ongoing commitment to your health goals. Please review the following plan we discussed and let me know if I can assist you in the future.   Screening recommendations/referrals: Colonoscopy aged out, followed by GI/oncology  Recommended yearly ophthalmology/optometry visit for glaucoma screening and checkup Recommended yearly dental visit for hygiene and checkup  Vaccinations: Influenza vaccine due annually in September/October Pneumococcal vaccine Recommend Prevnar 20 Tdap vaccine up to date Shingles vaccine up to date     Advanced directives: reviewed   Conditions/risks identified: hx of colon ca  Next appointment: 1 year  Preventive Care 87 Years and Older, Male Preventive care refers to lifestyle choices and visits with your health care provider that can promote health and wellness. What does preventive care include? A yearly physical exam. This is also called an annual well check. Dental exams once or twice a year. Routine eye exams. Ask your health care provider how often you should have your eyes checked. Personal lifestyle choices, including: Daily care of your teeth and gums. Regular physical activity. Eating a healthy diet. Avoiding tobacco and drug use. Limiting alcohol use. Practicing safe sex. Taking low doses of aspirin every day. Taking vitamin and mineral supplements as recommended by your health care provider. What happens during an annual well check? The services and screenings done by your health care provider during your annual well check will depend on your age, overall health, lifestyle risk factors, and family history of disease. Counseling  Your health care provider may ask you questions about your: Alcohol use. Tobacco use. Drug use. Emotional well-being. Home and relationship well-being. Sexual activity. Eating habits. History of falls. Memory and  ability to understand (cognition). Work and work Astronomer. Screening  You may have the following tests or measurements: Height, weight, and BMI. Blood pressure. Lipid and cholesterol levels. These may be checked every 5 years, or more frequently if you are over 17 years old. Skin check. Lung cancer screening. You may have this screening every year starting at age 38 if you have a 30-pack-year history of smoking and currently smoke or have quit within the past 15 years. Fecal occult blood test (FOBT) of the stool. You may have this test every year starting at age 54. Flexible sigmoidoscopy or colonoscopy. You may have a sigmoidoscopy every 5 years or a colonoscopy every 10 years starting at age 39. Prostate cancer screening. Recommendations will vary depending on your family history and other risks. Hepatitis C blood test. Hepatitis B blood test. Sexually transmitted disease (STD) testing. Diabetes screening. This is done by checking your blood sugar (glucose) after you have not eaten for a while (fasting). You may have this done every 1-3 years. Abdominal aortic aneurysm (AAA) screening. You may need this if you are a current or former smoker. Osteoporosis. You may be screened starting at age 34 if you are at high risk. Talk with your health care provider about your test results, treatment options, and if necessary, the need for more tests. Vaccines  Your health care provider may recommend certain vaccines, such as: Influenza vaccine. This is recommended every year. Tetanus, diphtheria, and acellular pertussis (Tdap, Td) vaccine. You may need a Td booster every 10 years. Zoster vaccine. You may need this after age 44. Pneumococcal 13-valent conjugate (PCV13) vaccine. One dose is recommended after age 66. Pneumococcal polysaccharide (PPSV23) vaccine. One dose is recommended after age 31. Talk to your health  care provider about which screenings and vaccines you need and how often you need  them. This information is not intended to replace advice given to you by your health care provider. Make sure you discuss any questions you have with your health care provider. Document Released: 05/29/2015 Document Revised: 01/20/2016 Document Reviewed: 03/03/2015 Elsevier Interactive Patient Education  2017 [employer].  Fall Prevention in the Home Falls can cause injuries. They can happen to people of all ages. There are many things you can do to make your home safe and to help prevent falls. What can I do on the outside of my home? Regularly fix the edges of walkways and driveways and fix any cracks. Remove anything that might make you trip as you walk through a door, such as a raised step or threshold. Trim any bushes or trees on the path to your home. Use bright outdoor lighting. Clear any walking paths of anything that might make someone trip, such as rocks or tools. Regularly check to see if handrails are loose or broken. Make sure that both sides of any steps have handrails. Any raised decks and porches should have guardrails on the edges. Have any leaves, snow, or ice cleared regularly. Use sand or salt on walking paths during winter. Clean up any spills in your garage right away. This includes oil or grease spills. What can I do in the bathroom? Use night lights. Install grab bars by the toilet and in the tub and shower. Do not use towel bars as grab bars. Use non-skid mats or decals in the tub or shower. If you need to sit down in the shower, use a plastic, non-slip stool. Keep the floor dry. Clean up any water that spills on the floor as soon as it happens. Remove soap buildup in the tub or shower regularly. Attach bath mats securely with double-sided non-slip rug tape. Do not have throw rugs and other things on the floor that can make you trip. What can I do in the bedroom? Use night lights. Make sure that you have a light by your bed that is easy to reach. Do not use  any sheets or blankets that are too big for your bed. They should not hang down onto the floor. Have a firm chair that has side arms. You can use this for support while you get dressed. Do not have throw rugs and other things on the floor that can make you trip. What can I do in the kitchen? Clean up any spills right away. Avoid walking on wet floors. Keep items that you use a lot in easy-to-reach places. If you need to reach something above you, use a strong step stool that has a grab bar. Keep electrical cords out of the way. Do not use floor polish or wax that makes floors slippery. If you must use wax, use non-skid floor wax. Do not have throw rugs and other things on the floor that can make you trip. What can I do with my stairs? Do not leave any items on the stairs. Make sure that there are handrails on both sides of the stairs and use them. Fix handrails that are broken or loose. Make sure that handrails are as long as the stairways. Check any carpeting to make sure that it is firmly attached to the stairs. Fix any carpet that is loose or worn. Avoid having throw rugs at the top or bottom of the stairs. If you do have throw rugs, attach them to  the floor with carpet tape. Make sure that you have a light switch at the top of the stairs and the bottom of the stairs. If you do not have them, ask someone to add them for you. What else can I do to help prevent falls? Wear shoes that: Do not have high heels. Have rubber bottoms. Are comfortable and fit you well. Are closed at the toe. Do not wear sandals. If you use a stepladder: Make sure that it is fully opened. Do not climb a closed stepladder. Make sure that both sides of the stepladder are locked into place. Ask someone to hold it for you, if possible. Clearly mark and make sure that you can see: Any grab bars or handrails. First and last steps. Where the edge of each step is. Use tools that help you move around (mobility aids)  if they are needed. These include: Canes. Walkers. Scooters. Crutches. Turn on the lights when you go into a dark area. Replace any light bulbs as soon as they burn out. Set up your furniture so you have a clear path. Avoid moving your furniture around. If any of your floors are uneven, fix them. If there are any pets around you, be aware of where they are. Review your medicines with your doctor. Some medicines can make you feel dizzy. This can increase your chance of falling. Ask your doctor what other things that you can do to help prevent falls. This information is not intended to replace advice given to you by your health care provider. Make sure you discuss any questions you have with your health care provider. Document Released: 02/26/2009 Document Revised: 10/08/2015 Document Reviewed: 06/06/2014 Elsevier Interactive Patient Education  2017 [employer].

## 2022-11-28 NOTE — Progress Notes (Signed)
Subjective:   Donald [name] is a 89 y.o. male who presents for Medicare Annual/Subsequent preventive examination at [facility] in the clinic setting.   Visit Complete: In person  Patient Medicare AWV questionnaire was completed by the patient on 11/28/22; I have confirmed that all information answered by patient is correct and no changes since this date.  Review of Systems           Objective:    Today's Vitals   11/28/22 1414 11/28/22 1417  BP: 120/72   Pulse: 71   Resp: 17   Temp: 98.7 F (37.1 C)   TempSrc: Temporal   SpO2: 96%   Weight: 227 lb 3.2 oz (103.1 kg)   Height: 5\' 8"  (1.727 m)   PainSc:  5    Body mass index is 34.55 kg/m.     11/28/2022    2:12 PM 11/01/2022   12:58 PM 08/19/2022    8:18 AM 07/26/2022    1:07 PM 05/24/2022   10:04 AM 04/06/2022    9:45 AM 02/18/2022    8:46 AM  Advanced Directives  Does Patient Have a Medical Advance Directive? Yes Yes Yes Yes Yes Yes Yes  Type of Estate agent of [place];Living will;Out of facility DNR (pink MOST or yellow form) Healthcare Power of [place];Living will;Out of facility DNR (pink MOST or yellow form) Healthcare Power of [place];Living will Healthcare Power of [place];Living will Living will;Healthcare Power of [place];Out of facility DNR (pink MOST or yellow form) Healthcare Power of [place];Living will   Does patient want to make changes to medical advance directive?  No - Patient declined No - Patient declined    No - Patient declined  Copy of Healthcare Power of Attorney in Chart?   Yes - validated most recent copy scanned in chart (See row information)  Yes - validated most recent copy scanned in chart (See row information) Yes - validated most recent copy scanned in chart (See row information)   Would patient like information on creating a medical advance directive?       No - Patient declined    Current Medications (verified) Outpatient Encounter Medications as of 11/28/2022   Medication Sig   acetaminophen (TYLENOL) 500 MG tablet Take 500 mg by mouth as needed for moderate pain or headache.   albuterol (VENTOLIN HFA) 108 (90 Base) MCG/ACT inhaler INHALE TWO PUFFS INTO THE LUNGS EVERY 6 HOURS AS NEEDED FOR WHEEZEING OR SHORTNESS OF BREATH   apixaban (ELIQUIS) 5 MG TABS tablet Take 1 tablet (5 mg total) by mouth 2 (two) times daily.   carboxymethylcellulose (REFRESH PLUS) 0.5 % SOLN Place 1 drop into both eyes daily as needed (dry eyes).   colestipol (COLESTID) 5 g granules Take 5 g by mouth daily.   diltiazem (CARDIZEM CD) 120 MG 24 hr capsule Take 1 capsule (120 mg total) by mouth 2 (two) times daily, at breakfast and at bedtime.   dofetilide (TIKOSYN) 500 MCG capsule Take 1 capsule (500 mcg total) by mouth 2 (two) times daily.   furosemide (LASIX) 20 MG tablet Take 1 tablet (20 mg total) by mouth 2 (two) times daily.   magnesium oxide (MAG-OX) 400 MG tablet TAKE ONE TABLET BY MOUTH DAILY (Patient taking differently: Take 400 mg by mouth at bedtime.)   metoprolol tartrate (LOPRESSOR) 25 MG tablet Take 1 tablet (25 mg total) by mouth 2 (two) times daily.   metroNIDAZOLE (METROGEL) 0.75 % gel Apply 1 Application topically 2 (two) times daily as  needed. To nose   Pedialyte (PEDIALYTE) SOLN Take 178 mLs by mouth daily. 6 oz   rosuvastatin (CRESTOR) 10 MG tablet Take 1 tablet (10 mg total) by mouth daily.   sildenafil (REVATIO) 20 MG tablet Take 20 mg by mouth daily as needed (erectile dysfunction).   sodium chloride (OCEAN) 0.65 % SOLN nasal spray Place 1 spray into both nostrils as needed for congestion.   No facility-administered encounter medications on file as of 11/28/2022.    Allergies (verified) Patient has no known allergies.   History: Past Medical History:  Diagnosis Date   Abnormal CT scan, stomach 02/2014   Thickening of gastric fundus and cardia.  gastritis on EGD 03/2014   Allergic rhinitis due to pollen    Anemia 12/2020   Arthritis     joints,finger   Arthropathy, unspecified, site unspecified    Atherosclerosis    a. Noted by abdominal CT 02/2014 (h/o normal nuc 2011).   Atrial flutter (HCC) 02/24/2014   s/p ablation 11/15   Blood transfusion without reported diagnosis    Cardiomyopathy (HCC)    tachy mediated - a. TEE (10/15):  EF 30%;  b. Echo after NSR restored (10/15):  mild LVH, EF 55-60%, mild AS, mild AI, mild MR, mild to mod LAE, mild RAE   Cataract    bilateral,removed replaced with implants   CHF (congestive heart failure) (HCC) 01/04/2021   COVID-19 11/2020   Diverticulosis    severe in descending, sigmoid colon.    ED (erectile dysfunction)    Emphysema of lung (HCC)    Esophageal stricture 03/2014   traversable with endoscope. not dilated.    Gastric AVM    GERD (gastroesophageal reflux disease) 03/2014   small HH and gastritis on EGD   GI bleed    Habitual alcohol use    Hemorrhoids    Hyperlipidemia    Hypertension    Obesity    OSA on CPAP    PAF (paroxysmal atrial fibrillation) (HCC)    Pneumonia 12/2020   mild with covid   Presence of permanent cardiac pacemaker    Prostatitis, unspecified    Pulmonary fibrosis (HCC)    Sinus bradycardia    a. s/p STJ dual chamber PPM   Skin cancer    basal and squamous cell   Sleep apnea    Substance abuse (HCC)    etoh   Past Surgical History:  Procedure Laterality Date   ATRIAL FLUTTER ABLATION N/A 03/19/2014   RFCA of atrial flutter by Dr Graciela Husbands   CARDIOVERSION N/A 02/24/2014   Procedure: CARDIOVERSION;  Surgeon: Lewayne Bunting, MD;  Location: [facility] ENDOSCOPY;  Service: Cardiovascular;  Laterality: N/A;   CARDIOVERSION Right 09/10/2014   Procedure: CARDIOVERSION;  Surgeon: Duke Salvia, MD;  Location: [facility] CATH LAB;  Service: Cardiovascular;  Laterality: Right;   CARDIOVERSION N/A 01/22/2018   Procedure: CARDIOVERSION;  Surgeon: Pricilla Riffle, MD;  Location: [facility] ENDOSCOPY;  Service: Cardiovascular;  Laterality: N/A;   COLECTOMY  03/24/2021    COLONOSCOPY N/A 04/23/2015   Procedure: COLONOSCOPY;  Surgeon: Meryl Dare, MD;  Location: [facility] ENDOSCOPY;  Service: Endoscopy;  Laterality: N/A;   COLONOSCOPY     ELBOW SURGERY Right    SCREW PLACED   ENTEROSCOPY N/A 06/12/2015   Procedure: ENTEROSCOPY;  Surgeon: Meryl Dare, MD;  Location: [facility] ENDOSCOPY;  Service: Endoscopy;  Laterality: N/A;   EP IMPLANTABLE DEVICE N/A 02/15/2016   Procedure: Pacemaker Implant;  Surgeon: Marinus Maw, MD;  Location:  [facility] INVASIVE CV LAB;  Service: Cardiovascular;  Laterality: N/A;   ESOPHAGOGASTRODUODENOSCOPY N/A 04/09/2014   Procedure: ESOPHAGOGASTRODUODENOSCOPY (EGD);  Surgeon: Louis Meckel, MD;  Location: [facility] ENDOSCOPY;  Service: Endoscopy;  Laterality: N/A;   ESOPHAGOGASTRODUODENOSCOPY N/A 04/23/2015   Procedure: ESOPHAGOGASTRODUODENOSCOPY (EGD);  Surgeon: Meryl Dare, MD;  Location: [facility] ENDOSCOPY;  Service: Endoscopy;  Laterality: N/A;   INGUINAL HERNIA REPAIR     right   INGUINAL HERNIA REPAIR     left   LEFT HEART CATH AND CORONARY ANGIOGRAPHY N/A 03/16/2017   Procedure: LEFT HEART CATH AND CORONARY ANGIOGRAPHY;  Surgeon: Kathleene Hazel, MD;  Location: [facility] INVASIVE CV LAB;  Service: Cardiovascular;  Laterality: N/A;   ORIF fracture of the elbow  1992   SKIN CANCER EXCISION  05/21/2012   Squamous cell ca   TEE WITHOUT CARDIOVERSION N/A 02/24/2014   Procedure: TRANSESOPHAGEAL ECHOCARDIOGRAM (TEE);  Surgeon: Lewayne Bunting, MD;  Location: [facility] ENDOSCOPY;  Service: Cardiovascular;  Laterality: N/A;   TONSILLECTOMY     AGE 64   UPPER GASTROINTESTINAL ENDOSCOPY     Family History  Problem Relation Age of Onset   Cancer Mother        ovarian   Other Father        renal disease- grief over loss of spouse   Cancer Brother        prostate- died of hematologic disorder 2nd to chemo   Pulmonary fibrosis Brother    Diabetes Neg Hx    Coronary artery disease Neg Hx    Colon cancer Neg Hx    Stomach cancer Neg Hx    Rectal cancer  Neg Hx    Colon polyps Neg Hx    Crohn's disease Neg Hx    Esophageal cancer Neg Hx    Ulcerative colitis Neg Hx    Social History   Socioeconomic History   Marital status: Married    Spouse name: Not on file   Number of children: 3   Years of education: 16   Highest education level: Not on file  Occupational History   Occupation: Comptroller    Employer: RETIRED  Tobacco Use   Smoking status: Former    Current packs/day: 0.00    Types: Cigarettes, Cigars    Quit date: 09/18/1968    Years since quitting: 54.2    Passive exposure: Past   Smokeless tobacco: Former    Types: Chew    Quit date: 2005  Vaping Use   Vaping status: Never Used  Substance and Sexual Activity   Alcohol use: Yes    Alcohol/week: 14.0 standard drinks of alcohol    Types: 14 Standard drinks or equivalent per week    Comment: 2 drinks nightly 03/21/22   Drug use: No   Sexual activity: Not Currently  Other Topics Concern   Not on file  Social History Narrative   HSG, [place]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 married 1961. 2 sons- '64, '63 , I daughter- '66, 4 grandchildren. work: Psychologist, forensic, retired but still consults. 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      No new stressors/       Consumption of caffeine-Coffee      Exercise- 30 mins swimming daily   Social Determinants of Health   Financial Resource Strain: Low Risk  (04/28/2020)   Overall Financial Resource Strain (CARDIA)    Difficulty of Paying Living Expenses: Not hard at  all  Food Insecurity: No Food Insecurity (04/28/2020)   Hunger Vital Sign    Worried About Running Out of Food in the Last Year: Never true    Ran Out of Food in the Last Year: Never true  Transportation Needs: No Transportation Needs (04/28/2020)   PRAPARE - Administrator, Civil Service (Medical): No    Lack of Transportation (Non-Medical): No  Physical Activity:  Sufficiently Active (04/28/2020)   Exercise Vital Sign    Days of Exercise per Week: 5 days    Minutes of Exercise per Session: 30 min  Stress: No Stress Concern Present (04/28/2020)   [employer] of Occupational Health - Occupational Stress Questionnaire    Feeling of Stress : Not at all  Social Connections: Socially Integrated (04/28/2020)   Social Connection and Isolation Panel [NHANES]    Frequency of Communication with Friends and Family: More than three times a week    Frequency of Social Gatherings with Friends and Family: More than three times a week    Attends Religious Services: More than 4 times per year    Active Member of [employer] or Organizations: Yes    Attends Engineer, structural: More than 4 times per year    Marital Status: Married    Tobacco Counseling Counseling given: Not Answered   Clinical Intake:     Pain : 0-10 Pain Score: 5  Pain Type: Chronic pain Pain Location: Back Pain Orientation: Lower Pain Onset: Today Pain Frequency: Occasional     BMI - recorded: 34.55 Nutritional Risks: None Diabetes: No  How often do you need to have someone help you when you read instructions, pamphlets, or other written materials from your doctor or pharmacy?: 1 - Never  Interpreter Needed?: No      Activities of Daily Living    11/28/2022    2:17 PM  In your present state of health, do you have any difficulty performing the following activities:  Hearing? 0  Vision? 0  Difficulty concentrating or making decisions? 0  Walking or climbing stairs? 0  Dressing or bathing? 0  Doing errands, shopping? 0    Patient Care Team: Mahlon Gammon, MD as PCP - General (Internal Medicine) Duke Salvia, MD as PCP - Cardiology (Cardiology) Duke Salvia, MD as PCP - Electrophysiology (Cardiology) Marcine Matar, MD (Urology) [place], [name], MD (Dermatology) Mateo Flow, MD as Consulting Physician (Ophthalmology) Duke Salvia, MD as  Consulting Physician (Cardiology) Pyrtle, Carie Caddy, MD as Consulting Physician (Gastroenterology) Kalman Shan, MD as Consulting Physician (Pulmonary Disease) Kathyrn Sheriff, [facility] (Inactive) as Pharmacist (Pharmacist)  Indicate any recent Medical Services you may have received from other than [facility] providers in the past year (date may be approximate).     Assessment:   This is a routine wellness examination for [place].  Hearing/Vision screen No results found.  Dietary issues and exercise activities discussed:     Goals Addressed   None    Depression Screen    11/28/2022    2:12 PM 11/01/2022   12:58 PM 07/26/2022    1:07 PM 05/24/2022   10:04 AM 11/22/2021   12:54 PM 08/31/2020    9:36 AM 04/28/2020    1:04 PM  PHQ 2/9 Scores  PHQ - 2 Score 0 0 0 0 0 0 0    Fall Risk    11/28/2022    2:11 PM 11/01/2022   12:58 PM 07/26/2022    1:07 PM 05/24/2022   10:04  AM 11/22/2021   12:53 PM  Fall Risk   Falls in the past year? 0 0 0 0 0  Number falls in past yr: 0 0 0 0 0  Injury with Fall? 0 0 0 0 0  Risk for fall due to : No Fall Risks No Fall Risks No Fall Risks No Fall Risks No Fall Risks  Follow up Falls evaluation completed  Falls evaluation completed Falls evaluation completed Falls evaluation completed    MEDICARE RISK AT HOME:  Medicare Risk at Home - 11/28/22 1422     Any stairs in or around the home? No    If so, are there any without handrails? No    Home free of loose throw rugs in walkways, pet beds, electrical cords, etc? Yes    Adequate lighting in your home to reduce risk of falls? Yes    Life alert? No    Use of a cane, walker or w/c? No    Grab bars in the bathroom? Yes    Shower chair or bench in shower? No    Elevated toilet seat or a handicapped toilet? Yes             TIMED UP AND GO:  Was the test performed?  Yes  Length of time to ambulate 10 feet: 13 sec Gait slow and steady without use of assistive device    Cognitive Function:     11/28/2022    2:16 PM 07/26/2022    1:11 PM 02/27/2018    1:34 PM 02/20/2017    8:26 AM 09/15/2014    9:46 AM  MMSE - Mini Mental State Exam  Not completed:     Unable to complete  Orientation to time 5 5 5 5    Orientation to Place 5 5 5 5    Registration 3 3 3 3    Attention/ Calculation 5 5 5 5    Recall 3 3 2 2    Language- name 2 objects 2 2 2 2    Language- repeat 1 1 1 1    Language- follow 3 step command 3 3 3 3    Language- read & follow direction 1 1 1 1    Write a sentence 1 1 1 1    Copy design 1 1 1 1    Total score 30 30 29 29          05/24/2022   10:05 AM  6CIT Screen  What Year? 0 points  What month? 0 points  What time? 0 points  Count back from 20 0 points  Months in reverse 0 points  Repeat phrase 0 points  Total Score 0 points    Immunizations Immunization History  Administered Date(s) Administered   COVID-19, mRNA, vaccine(Comirnaty)12 years and older 02/24/2022, 09/26/2022   Fluad Quad(high Dose 65+) 01/19/2019, 02/15/2022   H1N1 04/28/2008   Influenza Split 02/23/2011, 02/07/2012   Influenza Whole 02/07/2008, 02/04/2009   Influenza, High Dose Seasonal PF 01/20/2015, 03/07/2016, 02/20/2017, 02/20/2018, 03/05/2021   Influenza,inj,Quad PF,6+ Mos 02/07/2013, 02/03/2014   Influenza-Unspecified 02/20/2020, 02/15/2022   Moderna SARS-COV2 Booster Vaccination 10/18/2021   Moderna Sars-Covid-2 Vaccination 05/28/2019, 06/26/2019, 03/27/2020, 08/24/2020, 02/26/2021   Pneumococcal Conjugate-13 05/07/2013   Pneumococcal Polysaccharide-23 03/20/2007, 03/07/2016   Respiratory Syncytial Virus Vaccine,Recomb Aduvanted(Arexvy) 02/15/2022   Td 04/11/2008   Tdap 04/16/2018   Zoster Recombinant(Shingrix) 02/27/2018, 04/30/2018, 06/07/2022, 09/12/2022   Zoster, Live 01/27/2010    TDAP status: Up to date  Flu Vaccine status: Up to date  Pneumococcal vaccine status: Due, Education has been provided  regarding the importance of this vaccine. Advised may receive this vaccine at  local pharmacy or Health Dept. Aware to provide a copy of the vaccination record if obtained from local pharmacy or Health Dept. Verbalized acceptance and understanding.  Covid-19 vaccine status: Completed vaccines  Qualifies for Shingles Vaccine? Yes   Zostavax completed Yes   Shingrix Completed?: Yes  Screening Tests Health Maintenance  Topic Date Due   COVID-19 Vaccine (8 - 2023-24 season) 12/14/2022 (Originally 11/21/2022)   INFLUENZA VACCINE  12/15/2022   Medicare Annual Wellness (AWV)  11/28/2023   DTaP/Tdap/Td (3 - Td or Tdap) 04/16/2028   Pneumonia Vaccine 42+ Years old  Completed   Zoster Vaccines- Shingrix  Completed   HPV VACCINES  Aged Out    Health Maintenance  There are no preventive care reminders to display for this patient.   Colorectal cancer screening: No longer required.   Lung Cancer Screening: (Low Dose CT Chest recommended if Age 56-80 years, 20 pack-year currently smoking OR have quit w/in 15years.) does not qualify.  Sees oncology and GI with prior hx of colon ca.   Lung Cancer Screening Referral: na  Additional Screening:  Hepatitis C Screening: does not qualify; Completed na  Vision Screening: Recommended annual ophthalmology exams for early detection of glaucoma and other disorders of the eye. Is the patient up to date with their annual eye exam?  Yes  Who is the provider or what is the name of the office in which the patient attends annual eye exams? [facility]  If pt is not established with a provider, would they like to be referred to a provider to establish care? No .   Dental Screening: Recommended annual dental exams for proper oral hygiene  Diabetic Foot Exam: Diabetic Foot Exam: Completed NA  Community Resource Referral / Chronic Care Management: CRR required this visit?  No   CCM required this visit?  No     Plan:     I have personally reviewed and noted the following in the patient's chart:   Medical and social history Use  of alcohol, tobacco or illicit drugs  Current medications and supplements including opioid prescriptions. Patient is not currently taking opioid prescriptions. Functional ability and status Nutritional status Physical activity Advanced directives List of other physicians Hospitalizations, surgeries, and ER visits in previous 12 months Vitals Screenings to include cognitive, depression, and falls Referrals and appointments  In addition, I have reviewed and discussed with patient certain preventive protocols, quality metrics, and best practice recommendations. A written personalized care plan for preventive services as well as general preventive health recommendations were provided to patient.     Fletcher Anon, NP   11/28/2022   After Visit Summary: Given to pt  Nurse Notes: Trying to reduce alcohol 3 drinks per day right now, trying to reduce Pt takes colestid but can't swallow it as the pill is too big. Will try granules. Does slightly different due to formulation. Recommend reviewing with pharmacy.

## 2022-11-29 ENCOUNTER — Other Ambulatory Visit (HOSPITAL_BASED_OUTPATIENT_CLINIC_OR_DEPARTMENT_OTHER): Payer: Self-pay

## 2022-11-29 DIAGNOSIS — G4733 Obstructive sleep apnea (adult) (pediatric): Secondary | ICD-10-CM | POA: Diagnosis not present

## 2022-11-30 ENCOUNTER — Other Ambulatory Visit (HOSPITAL_BASED_OUTPATIENT_CLINIC_OR_DEPARTMENT_OTHER): Payer: Self-pay

## 2022-12-09 NOTE — Progress Notes (Signed)
Remote pacemaker transmission.   

## 2022-12-10 ENCOUNTER — Other Ambulatory Visit (HOSPITAL_BASED_OUTPATIENT_CLINIC_OR_DEPARTMENT_OTHER): Payer: Self-pay

## 2022-12-12 ENCOUNTER — Other Ambulatory Visit (HOSPITAL_BASED_OUTPATIENT_CLINIC_OR_DEPARTMENT_OTHER): Payer: Self-pay

## 2023-01-02 DIAGNOSIS — G4733 Obstructive sleep apnea (adult) (pediatric): Secondary | ICD-10-CM | POA: Diagnosis not present

## 2023-01-16 ENCOUNTER — Other Ambulatory Visit (HOSPITAL_BASED_OUTPATIENT_CLINIC_OR_DEPARTMENT_OTHER): Payer: Self-pay

## 2023-01-24 DIAGNOSIS — L570 Actinic keratosis: Secondary | ICD-10-CM | POA: Diagnosis not present

## 2023-01-24 DIAGNOSIS — L718 Other rosacea: Secondary | ICD-10-CM | POA: Diagnosis not present

## 2023-01-24 DIAGNOSIS — Z85828 Personal history of other malignant neoplasm of skin: Secondary | ICD-10-CM | POA: Diagnosis not present

## 2023-01-24 DIAGNOSIS — D1801 Hemangioma of skin and subcutaneous tissue: Secondary | ICD-10-CM | POA: Diagnosis not present

## 2023-01-24 DIAGNOSIS — L821 Other seborrheic keratosis: Secondary | ICD-10-CM | POA: Diagnosis not present

## 2023-01-24 DIAGNOSIS — D692 Other nonthrombocytopenic purpura: Secondary | ICD-10-CM | POA: Diagnosis not present

## 2023-01-24 DIAGNOSIS — L814 Other melanin hyperpigmentation: Secondary | ICD-10-CM | POA: Diagnosis not present

## 2023-01-24 DIAGNOSIS — D225 Melanocytic nevi of trunk: Secondary | ICD-10-CM | POA: Diagnosis not present

## 2023-01-26 ENCOUNTER — Encounter: Payer: Self-pay | Admitting: Internal Medicine

## 2023-01-30 ENCOUNTER — Other Ambulatory Visit: Payer: Self-pay | Admitting: Internal Medicine

## 2023-01-30 ENCOUNTER — Other Ambulatory Visit (HOSPITAL_BASED_OUTPATIENT_CLINIC_OR_DEPARTMENT_OTHER): Payer: Self-pay

## 2023-01-30 MED ORDER — ALBUTEROL SULFATE HFA 108 (90 BASE) MCG/ACT IN AERS
INHALATION_SPRAY | RESPIRATORY_TRACT | 3 refills | Status: DC
  Filled 2023-01-30: qty 8.5, 28d supply, fill #0
  Filled 2023-04-07: qty 8.5, 28d supply, fill #1
  Filled 2024-01-29: qty 8.5, 28d supply, fill #2

## 2023-01-31 ENCOUNTER — Other Ambulatory Visit (HOSPITAL_BASED_OUTPATIENT_CLINIC_OR_DEPARTMENT_OTHER): Payer: Self-pay

## 2023-01-31 MED ORDER — FLUAD 0.5 ML IM SUSY
PREFILLED_SYRINGE | Freq: Once | INTRAMUSCULAR | 0 refills | Status: AC
  Filled 2023-01-31: qty 0.5, 1d supply, fill #0

## 2023-01-31 MED ORDER — COMIRNATY 30 MCG/0.3ML IM SUSY
PREFILLED_SYRINGE | Freq: Once | INTRAMUSCULAR | 0 refills | Status: AC
  Filled 2023-01-31: qty 0.3, 1d supply, fill #0

## 2023-02-01 DIAGNOSIS — G4733 Obstructive sleep apnea (adult) (pediatric): Secondary | ICD-10-CM | POA: Diagnosis not present

## 2023-02-06 ENCOUNTER — Other Ambulatory Visit (HOSPITAL_BASED_OUTPATIENT_CLINIC_OR_DEPARTMENT_OTHER): Payer: Self-pay

## 2023-02-13 ENCOUNTER — Other Ambulatory Visit: Payer: Self-pay

## 2023-02-13 ENCOUNTER — Other Ambulatory Visit (HOSPITAL_BASED_OUTPATIENT_CLINIC_OR_DEPARTMENT_OTHER): Payer: Self-pay

## 2023-02-14 ENCOUNTER — Ambulatory Visit: Payer: PPO

## 2023-02-14 DIAGNOSIS — I495 Sick sinus syndrome: Secondary | ICD-10-CM

## 2023-02-15 LAB — CUP PACEART REMOTE DEVICE CHECK
Battery Remaining Longevity: 37 mo
Battery Remaining Percentage: 32 %
Battery Voltage: 2.96 V
Brady Statistic AP VP Percent: 1 %
Brady Statistic AP VS Percent: 98 %
Brady Statistic AS VP Percent: 1 %
Brady Statistic AS VS Percent: 1.5 %
Brady Statistic RA Percent Paced: 98 %
Brady Statistic RV Percent Paced: 1 %
Date Time Interrogation Session: 20241001030501
Implantable Lead Connection Status: 753985
Implantable Lead Connection Status: 753985
Implantable Lead Implant Date: 20171002
Implantable Lead Implant Date: 20171002
Implantable Lead Location: 753859
Implantable Lead Location: 753860
Implantable Pulse Generator Implant Date: 20171002
Lead Channel Impedance Value: 430 Ohm
Lead Channel Impedance Value: 580 Ohm
Lead Channel Pacing Threshold Amplitude: 0.5 V
Lead Channel Pacing Threshold Amplitude: 0.5 V
Lead Channel Pacing Threshold Pulse Width: 0.5 ms
Lead Channel Pacing Threshold Pulse Width: 0.5 ms
Lead Channel Sensing Intrinsic Amplitude: 12 mV
Lead Channel Sensing Intrinsic Amplitude: 3.2 mV
Lead Channel Setting Pacing Amplitude: 1.5 V
Lead Channel Setting Pacing Amplitude: 2.5 V
Lead Channel Setting Pacing Pulse Width: 0.5 ms
Lead Channel Setting Sensing Sensitivity: 2 mV
Pulse Gen Model: 2272
Pulse Gen Serial Number: 7951215

## 2023-02-19 ENCOUNTER — Other Ambulatory Visit (HOSPITAL_BASED_OUTPATIENT_CLINIC_OR_DEPARTMENT_OTHER): Payer: Self-pay

## 2023-02-22 ENCOUNTER — Other Ambulatory Visit (HOSPITAL_BASED_OUTPATIENT_CLINIC_OR_DEPARTMENT_OTHER): Payer: Self-pay

## 2023-02-22 MED ORDER — PNEUMOCOCCAL 20-VAL CONJ VACC 0.5 ML IM SUSY
0.5000 mL | PREFILLED_SYRINGE | Freq: Once | INTRAMUSCULAR | 0 refills | Status: AC
  Filled 2023-02-22: qty 0.5, 1d supply, fill #0

## 2023-02-28 ENCOUNTER — Other Ambulatory Visit (HOSPITAL_BASED_OUTPATIENT_CLINIC_OR_DEPARTMENT_OTHER): Payer: Self-pay

## 2023-03-02 NOTE — Progress Notes (Signed)
Remote pacemaker transmission.

## 2023-03-09 ENCOUNTER — Encounter: Payer: Self-pay | Admitting: Internal Medicine

## 2023-03-10 ENCOUNTER — Other Ambulatory Visit (HOSPITAL_BASED_OUTPATIENT_CLINIC_OR_DEPARTMENT_OTHER): Payer: Self-pay

## 2023-03-10 ENCOUNTER — Other Ambulatory Visit: Payer: Self-pay

## 2023-03-13 ENCOUNTER — Other Ambulatory Visit: Payer: Self-pay

## 2023-03-13 ENCOUNTER — Other Ambulatory Visit (HOSPITAL_BASED_OUTPATIENT_CLINIC_OR_DEPARTMENT_OTHER): Payer: Self-pay

## 2023-03-14 ENCOUNTER — Other Ambulatory Visit (HOSPITAL_BASED_OUTPATIENT_CLINIC_OR_DEPARTMENT_OTHER): Payer: Self-pay

## 2023-03-14 ENCOUNTER — Inpatient Hospital Stay: Payer: PPO | Attending: Oncology

## 2023-03-14 ENCOUNTER — Telehealth: Payer: Self-pay | Admitting: Pharmacy Technician

## 2023-03-14 ENCOUNTER — Other Ambulatory Visit (HOSPITAL_COMMUNITY): Payer: Self-pay

## 2023-03-14 ENCOUNTER — Ambulatory Visit (HOSPITAL_BASED_OUTPATIENT_CLINIC_OR_DEPARTMENT_OTHER)
Admission: RE | Admit: 2023-03-14 | Discharge: 2023-03-14 | Disposition: A | Discharge status: Home / Self Care | Payer: PPO | Source: Ambulatory Visit | Attending: Oncology | Admitting: Oncology

## 2023-03-14 DIAGNOSIS — I4891 Unspecified atrial fibrillation: Secondary | ICD-10-CM | POA: Diagnosis not present

## 2023-03-14 DIAGNOSIS — I2510 Atherosclerotic heart disease of native coronary artery without angina pectoris: Secondary | ICD-10-CM | POA: Diagnosis not present

## 2023-03-14 DIAGNOSIS — D509 Iron deficiency anemia, unspecified: Secondary | ICD-10-CM | POA: Diagnosis not present

## 2023-03-14 DIAGNOSIS — K7689 Other specified diseases of liver: Secondary | ICD-10-CM | POA: Diagnosis not present

## 2023-03-14 DIAGNOSIS — C182 Malignant neoplasm of ascending colon: Secondary | ICD-10-CM

## 2023-03-14 DIAGNOSIS — C19 Malignant neoplasm of rectosigmoid junction: Secondary | ICD-10-CM | POA: Diagnosis not present

## 2023-03-14 DIAGNOSIS — J449 Chronic obstructive pulmonary disease, unspecified: Secondary | ICD-10-CM | POA: Insufficient documentation

## 2023-03-14 DIAGNOSIS — K5790 Diverticulosis of intestine, part unspecified, without perforation or abscess without bleeding: Secondary | ICD-10-CM | POA: Diagnosis not present

## 2023-03-14 DIAGNOSIS — C180 Malignant neoplasm of cecum: Secondary | ICD-10-CM | POA: Diagnosis not present

## 2023-03-14 DIAGNOSIS — N400 Enlarged prostate without lower urinary tract symptoms: Secondary | ICD-10-CM | POA: Diagnosis not present

## 2023-03-14 LAB — BASIC METABOLIC PANEL - CANCER CENTER ONLY
Anion gap: 6 (ref 5–15)
BUN: 19 mg/dL (ref 8–23)
CO2: 29 mmol/L (ref 22–32)
Calcium: 10.2 mg/dL (ref 8.9–10.3)
Chloride: 103 mmol/L (ref 98–111)
Creatinine: 0.96 mg/dL (ref 0.61–1.24)
GFR, Estimated: >60 mL/min (ref >60)
Glucose, Bld: 95 mg/dL (ref 70–99)
Potassium: 4.8 mmol/L (ref 3.5–5.1)
Sodium: 138 mmol/L (ref 135–145)

## 2023-03-14 LAB — POCT I-STAT CREATININE: Creatinine, Ser: 1.1 mg/dL (ref 0.61–1.24)

## 2023-03-14 LAB — CEA (ACCESS): CEA (CHCC): 1.97 ng/mL (ref 0.00–5.00)

## 2023-03-14 MED ORDER — IOHEXOL 300 MG/ML  SOLN
100.0000 mL | Freq: Once | INTRAMUSCULAR | Status: AC | PRN
  Administered 2023-03-14: 100 mL via INTRAVENOUS

## 2023-03-14 NOTE — Telephone Encounter (Signed)
PA request has been Cancelled. New Encounter created for follow up. For additional info see Pharmacy Prior Auth telephone encounter from 03/14/23. Prior auth too soon to submit.

## 2023-03-17 ENCOUNTER — Other Ambulatory Visit: Payer: Self-pay | Admitting: Medical Genetics

## 2023-03-17 DIAGNOSIS — Z006 Encounter for examination for normal comparison and control in clinical research program: Secondary | ICD-10-CM

## 2023-03-20 ENCOUNTER — Inpatient Hospital Stay: Payer: PPO | Attending: Oncology | Admitting: Oncology

## 2023-03-20 ENCOUNTER — Other Ambulatory Visit: Payer: Self-pay | Admitting: Oncology

## 2023-03-20 ENCOUNTER — Telehealth: Payer: Self-pay | Admitting: *Deleted

## 2023-03-20 VITALS — BP 127/70 | HR 67 | Temp 98.1°F | Resp 18 | Ht 68.0 in | Wt 222.5 lb

## 2023-03-20 DIAGNOSIS — D509 Iron deficiency anemia, unspecified: Secondary | ICD-10-CM | POA: Diagnosis not present

## 2023-03-20 DIAGNOSIS — I495 Sick sinus syndrome: Secondary | ICD-10-CM | POA: Diagnosis not present

## 2023-03-20 DIAGNOSIS — Z950 Presence of cardiac pacemaker: Secondary | ICD-10-CM | POA: Diagnosis not present

## 2023-03-20 DIAGNOSIS — Z860101 Personal history of adenomatous and serrated colon polyps: Secondary | ICD-10-CM | POA: Diagnosis not present

## 2023-03-20 DIAGNOSIS — Z7901 Long term (current) use of anticoagulants: Secondary | ICD-10-CM | POA: Insufficient documentation

## 2023-03-20 DIAGNOSIS — C182 Malignant neoplasm of ascending colon: Secondary | ICD-10-CM

## 2023-03-20 DIAGNOSIS — I4891 Unspecified atrial fibrillation: Secondary | ICD-10-CM | POA: Insufficient documentation

## 2023-03-20 DIAGNOSIS — C180 Malignant neoplasm of cecum: Secondary | ICD-10-CM | POA: Diagnosis not present

## 2023-03-20 NOTE — Telephone Encounter (Signed)
LVM that PET scheduled for 11/18. Will send specifics via MyChart visit.

## 2023-03-20 NOTE — Progress Notes (Signed)
Donald [name]   Diagnosis: Colon cancer  INTERVAL HISTORY:   Donald [name] returns as scheduled.  He feels well.  Good appetite.  No new complaint.  Objective:  Vital signs in last 24 hours:  Blood pressure 127/70, pulse 67, temperature 98.1 F (36.7 C), temperature source Temporal, resp. rate 18, height 5\' 8"  (1.727 m), weight 222 lb 8 oz (100.9 kg), SpO2 98%.     Lymphatics: No cervical, supraclavicular, axillary, or inguinal nodes Resp: Lungs clear bilaterally Cardio: Regular rate and rhythm GI: No hepatosplenomegaly, no mass, mild tenderness in the left lower abdomen Vascular: No leg edema    Lab Results:  Lab Results  Component Value Date   WBC 9.6 10/24/2022   HGB 14.9 10/24/2022   HCT 45.0 10/24/2022   MCV 95 10/24/2022   PLT 186 10/24/2022   NEUTROABS 5.8 09/20/2021    CMP  Lab Results  Component Value Date   NA 138 03/14/2023   K 4.8 03/14/2023   CL 103 03/14/2023   CO2 29 03/14/2023   GLUCOSE 95 03/14/2023   BUN 19 03/14/2023   CREATININE 1.10 03/14/2023   CALCIUM 10.2 03/14/2023   PROT 7.1 09/20/2021   ALBUMIN 4.1 09/20/2021   AST 19 09/20/2021   ALT 16 09/20/2021   ALKPHOS 62 09/20/2021   BILITOT 1.0 09/20/2021   GFRNONAA >60 03/14/2023   GFRAA >60 02/25/2019    Lab Results  Component Value Date   CEA 1.97 03/14/2023    Medications: I have reviewed the patient's current medications.   Assessment/Plan:  Colon cancer-stage III (T3N1), cecum mass biopsy 02/03/2021-adenocarcinoma CTs 02/16/2021-stable interstitial lung disease, mild COPD, cardiomegaly, CAD, multiple liver cysts with a few subcentimeter too small to characterize liver lesions, circumferential wall thickening at the ileocecal valve, right lower quadrant ileocolic lymph node measures 1.1 cm 03/24/2021, robotic assisted laparoscopic right colectomy-T3, 1/8 lymph nodes, 1 tumor deposit, lymphovascular invasion, mismatch repair protein expression  intact, MSS Cycle 1 adjuvant Xeloda 04/21/2021 Cycle 2 adjuvant Xeloda 05/12/2021 Cycle 3 adjuvant Xeloda 06/07/2021 (5-day delay in start date due to foot discomfort, travel plans) Cycle 4 adjuvant Xeloda 06/29/2021, Xeloda dose reduced to 1000 mg twice daily for 14 days Cycle 5 adjuvant Xeloda 07/19/2021 Cycle 6 adjuvant Xeloda 08/09/2021 Cycle 7 adjuvant Xeloda 08/30/2021 Cycle 8 adjuvant Xeloda 09/20/2021 CTs 02/14/2022-no evidence of recurrent disease CT abdomen/pelvis 03/14/2023-the right central mesentery and right iliac fossa implants CAD Interstitial lung disease Iron deficiency anemia secondary to #1 Multiple colon polyps on the colonoscopy 02/03/2021-microscopic fragments of adenocarcinoma involving biopsies of transverse and ascending colon polyps felt to be contaminant from the cecum biopsy 2 tubular adenomas on the right colectomy specimen 03/24/2021 OSA Sick sinus syndrome-pacemaker Atrial fibrillation-maintained on apixaban Chronic diastolic CHF History of multiple skin cancers     Disposition: Donald [name] has a history of colon cancer.  The surveillance CT reveals evidence of new mesenteric implants.  I reviewed the CT findings and images with Donald [name] and Donald [name].  He understands the likelihood the CT findings represent recurrence of colon cancer.  We discussed observation, an image guided biopsy of the dominant mesenteric mass, and a staging PET scan.  He prefers having a PET scan as opposed to a diagnostic biopsy.  He appears asymptomatic in the tumor burden appears to be low.  We will likely recommend observation if the staging PET reveals no other evidence of measurable disease.  Donald [name] will return for an office visit after the  staging PET scan.   Thornton Papas, MD  03/20/2023  11:50 AM

## 2023-03-28 ENCOUNTER — Other Ambulatory Visit (HOSPITAL_COMMUNITY): Payer: Self-pay

## 2023-03-28 ENCOUNTER — Telehealth: Payer: Self-pay | Admitting: Pharmacy Technician

## 2023-03-28 NOTE — Telephone Encounter (Signed)
PA request has been Cancelled. Too soon to submit prior auth- earliest is 12/1. Will resubmit at that time

## 2023-03-28 NOTE — Telephone Encounter (Signed)
Pharmacy Patient Advocate Encounter   Received notification from Patient Advice Request messages that prior authorization for dofetilide is required/requested.   Insurance verification completed.   The patient is insured through [facility] ADVANTAGE/RX ADVANCE .   Per test claim: PA required; PA submitted to above mentioned insurance via Fax Key/confirmation #/EOC faxed Status is pending

## 2023-03-28 NOTE — Telephone Encounter (Signed)
[employer] called and report dofetilide does not require PA anymore so they are dismissing the PA case.

## 2023-03-31 ENCOUNTER — Encounter: Payer: Self-pay | Admitting: Pulmonary Disease

## 2023-04-03 ENCOUNTER — Ambulatory Visit (HOSPITAL_COMMUNITY)
Admission: RE | Admit: 2023-04-03 | Discharge: 2023-04-03 | Disposition: A | Discharge status: Home / Self Care | Payer: PPO | Source: Ambulatory Visit | Attending: Oncology | Admitting: Oncology

## 2023-04-03 DIAGNOSIS — C182 Malignant neoplasm of ascending colon: Secondary | ICD-10-CM | POA: Diagnosis not present

## 2023-04-03 DIAGNOSIS — R918 Other nonspecific abnormal finding of lung field: Secondary | ICD-10-CM | POA: Diagnosis not present

## 2023-04-03 DIAGNOSIS — C189 Malignant neoplasm of colon, unspecified: Secondary | ICD-10-CM | POA: Diagnosis not present

## 2023-04-03 DIAGNOSIS — G4733 Obstructive sleep apnea (adult) (pediatric): Secondary | ICD-10-CM | POA: Diagnosis not present

## 2023-04-03 LAB — GLUCOSE, CAPILLARY: Glucose-Capillary: 99 mg/dL (ref 70–99)

## 2023-04-03 MED ORDER — FLUDEOXYGLUCOSE F - 18 (FDG) INJECTION
11.0000 | Freq: Once | INTRAVENOUS | Status: AC | PRN
  Administered 2023-04-03: 11.03 via INTRAVENOUS

## 2023-04-05 NOTE — Telephone Encounter (Signed)
Telephone call  

## 2023-04-06 ENCOUNTER — Ambulatory Visit (HOSPITAL_BASED_OUTPATIENT_CLINIC_OR_DEPARTMENT_OTHER)
Admission: RE | Admit: 2023-04-06 | Discharge: 2023-04-06 | Disposition: A | Discharge status: Home / Self Care | Payer: PPO | Source: Ambulatory Visit | Attending: Pulmonary Disease | Admitting: Pulmonary Disease

## 2023-04-06 DIAGNOSIS — J849 Interstitial pulmonary disease, unspecified: Secondary | ICD-10-CM | POA: Insufficient documentation

## 2023-04-06 DIAGNOSIS — C189 Malignant neoplasm of colon, unspecified: Secondary | ICD-10-CM | POA: Diagnosis not present

## 2023-04-06 DIAGNOSIS — R918 Other nonspecific abnormal finding of lung field: Secondary | ICD-10-CM | POA: Diagnosis not present

## 2023-04-07 ENCOUNTER — Other Ambulatory Visit (HOSPITAL_BASED_OUTPATIENT_CLINIC_OR_DEPARTMENT_OTHER): Payer: Self-pay

## 2023-04-10 ENCOUNTER — Other Ambulatory Visit: Payer: Self-pay

## 2023-04-10 ENCOUNTER — Other Ambulatory Visit (HOSPITAL_BASED_OUTPATIENT_CLINIC_OR_DEPARTMENT_OTHER): Payer: Self-pay

## 2023-04-11 ENCOUNTER — Inpatient Hospital Stay: Payer: PPO | Admitting: Oncology

## 2023-04-11 ENCOUNTER — Other Ambulatory Visit (HOSPITAL_BASED_OUTPATIENT_CLINIC_OR_DEPARTMENT_OTHER): Payer: Self-pay

## 2023-04-11 ENCOUNTER — Other Ambulatory Visit (HOSPITAL_COMMUNITY)
Admission: RE | Admit: 2023-04-11 | Discharge: 2023-04-11 | Disposition: A | Discharge status: Home / Self Care | Payer: Self-pay | Source: Ambulatory Visit | Attending: Oncology | Admitting: Oncology

## 2023-04-11 VITALS — BP 133/64 | HR 69 | Temp 98.2°F | Resp 18 | Ht 68.0 in | Wt 224.4 lb

## 2023-04-11 DIAGNOSIS — C182 Malignant neoplasm of ascending colon: Secondary | ICD-10-CM

## 2023-04-11 DIAGNOSIS — C180 Malignant neoplasm of cecum: Secondary | ICD-10-CM | POA: Diagnosis not present

## 2023-04-11 DIAGNOSIS — Z006 Encounter for examination for normal comparison and control in clinical research program: Secondary | ICD-10-CM | POA: Insufficient documentation

## 2023-04-11 NOTE — Progress Notes (Signed)
Donald [name]   Diagnosis: Colon cancer  INTERVAL HISTORY:   Donald [name] returns as scheduled.  He feels well.  Good appetite.  He reports pill dysphagia for years.  No dysphagia with food.  No abdominal pain or nausea.  He plans to schedule an appoint with Dr. Rhea Belton to evaluate diarrhea.  Objective:  Vital signs in last 24 hours:  Blood pressure 133/64, pulse 69, temperature 98.2 F (36.8 C), temperature source Temporal, resp. rate 18, height 5\' 8"  (1.727 m), weight 224 lb 6.4 oz (101.8 kg), SpO2 96%.    Resp: Lungs clear bilaterally Cardio: Regular rate and rhythm GI: No hepatosplenomegaly, no mass Vascular: No leg edema  Lab Results:  Lab Results  Component Value Date   WBC 9.6 10/24/2022   HGB 14.9 10/24/2022   HCT 45.0 10/24/2022   MCV 95 10/24/2022   PLT 186 10/24/2022   NEUTROABS 5.8 09/20/2021    CMP  Lab Results  Component Value Date   NA 138 03/14/2023   K 4.8 03/14/2023   CL 103 03/14/2023   CO2 29 03/14/2023   GLUCOSE 95 03/14/2023   BUN 19 03/14/2023   CREATININE 1.10 03/14/2023   CALCIUM 10.2 03/14/2023   PROT 7.1 09/20/2021   ALBUMIN 4.1 09/20/2021   AST 19 09/20/2021   ALT 16 09/20/2021   ALKPHOS 62 09/20/2021   BILITOT 1.0 09/20/2021   GFRNONAA >60 03/14/2023   GFRAA >60 02/25/2019    Lab Results  Component Value Date   CEA 1.97 03/14/2023    Lab Results  Component Value Date   INR 1.52 06/08/2017   LABPROT 18.2 (H) 06/08/2017    Imaging:  No results found.  Medications: I have reviewed the patient's current medications.   Assessment/Plan: Colon cancer-stage III (T3N1), cecum mass biopsy 02/03/2021-adenocarcinoma CTs 02/16/2021-stable interstitial lung disease, mild COPD, cardiomegaly, CAD, multiple liver cysts with a few subcentimeter too small to characterize liver lesions, circumferential wall thickening at the ileocecal valve, right lower quadrant ileocolic lymph node measures 1.1  cm 03/24/2021, robotic assisted laparoscopic right colectomy-T3, 1/8 lymph nodes, 1 tumor deposit, lymphovascular invasion, mismatch repair protein expression intact, MSS Cycle 1 adjuvant Xeloda 04/21/2021 Cycle 2 adjuvant Xeloda 05/12/2021 Cycle 3 adjuvant Xeloda 06/07/2021 (5-day delay in start date due to foot discomfort, travel plans) Cycle 4 adjuvant Xeloda 06/29/2021, Xeloda dose reduced to 1000 mg twice daily for 14 days Cycle 5 adjuvant Xeloda 07/19/2021 Cycle 6 adjuvant Xeloda 08/09/2021 Cycle 7 adjuvant Xeloda 08/30/2021 Cycle 8 adjuvant Xeloda 09/20/2021 CTs 02/14/2022-no evidence of recurrent disease CT abdomen/pelvis 03/14/2023- right central mesentery and right iliac fossa implants PET 02/01/2023: Hypermetabolic right small bowel mesenteric mass, mild focal hypermetabolism in the distal esophagus-nonspecific.  The right pelvic nodule is hypermetabolic by my review CAD Interstitial lung disease Iron deficiency anemia secondary to #1 Multiple colon polyps on the colonoscopy 02/03/2021-microscopic fragments of adenocarcinoma involving biopsies of transverse and ascending colon polyps felt to be contaminant from the cecum biopsy 2 tubular adenomas on the right colectomy specimen 03/24/2021 OSA Sick sinus syndrome-pacemaker Atrial fibrillation-maintained on apixaban Chronic diastolic CHF History of multiple skin cancers       Disposition: Donald [name] has a history of colon cancer.  He was found to have new mesenteric implants on a surveillance CT last month.  The PET reveals hypermetabolism associated with these lesions consistent with metastatic disease.  I discussed the apparent diagnosis of metastatic colon cancer and treatment options with Donald [name] and his wife.  We discussed observation versus a trial of systemic chemotherapy.  He understands chemotherapy would require Port-A-Cath placement and treatment approximately every 2 weeks.  He appears asymptomatic.  We decided to submit the  2022: Tumor for NGS testing.  He will return for an office visit and further discussion in approximately 3 weeks.  The most likely plan is to continue observation with a repeat CT abdomen/pelvis at a 3-38-month interval.  Thornton Papas, MD  04/11/2023  4:02 PM

## 2023-04-12 ENCOUNTER — Encounter: Payer: Self-pay | Admitting: *Deleted

## 2023-04-17 ENCOUNTER — Other Ambulatory Visit: Payer: Self-pay

## 2023-04-18 DIAGNOSIS — G4733 Obstructive sleep apnea (adult) (pediatric): Secondary | ICD-10-CM | POA: Diagnosis not present

## 2023-04-20 ENCOUNTER — Encounter: Payer: Self-pay | Admitting: *Deleted

## 2023-04-20 NOTE — Progress Notes (Signed)
Received faxed notification that tissue has been received for [employer] testing.

## 2023-04-23 LAB — GENECONNECT MOLECULAR SCREEN: Genetic Analysis Overall Interpretation: NEGATIVE

## 2023-04-26 ENCOUNTER — Encounter (HOSPITAL_COMMUNITY): Payer: Self-pay | Admitting: Oncology

## 2023-04-26 ENCOUNTER — Encounter: Payer: Self-pay | Admitting: Internal Medicine

## 2023-04-26 ENCOUNTER — Ambulatory Visit: Payer: PPO | Attending: Internal Medicine | Admitting: Internal Medicine

## 2023-04-26 VITALS — BP 110/70 | HR 60 | Ht 68.0 in | Wt 219.8 lb

## 2023-04-26 DIAGNOSIS — I495 Sick sinus syndrome: Secondary | ICD-10-CM

## 2023-04-26 DIAGNOSIS — I4892 Unspecified atrial flutter: Secondary | ICD-10-CM | POA: Diagnosis not present

## 2023-04-26 DIAGNOSIS — I480 Paroxysmal atrial fibrillation: Secondary | ICD-10-CM | POA: Diagnosis not present

## 2023-04-26 DIAGNOSIS — Z950 Presence of cardiac pacemaker: Secondary | ICD-10-CM

## 2023-04-26 DIAGNOSIS — I5032 Chronic diastolic (congestive) heart failure: Secondary | ICD-10-CM

## 2023-04-26 DIAGNOSIS — C189 Malignant neoplasm of colon, unspecified: Secondary | ICD-10-CM | POA: Diagnosis not present

## 2023-04-26 DIAGNOSIS — Z79899 Other long term (current) drug therapy: Secondary | ICD-10-CM

## 2023-04-26 NOTE — Patient Instructions (Signed)
Medication Instructions:  Your physician recommends that you continue on your current medications as directed. Please refer to the Current Medication list given to you today.  *If you need a refill on your cardiac medications before your next appointment, please call your pharmacy*   Lab Work: Mg level today If you have labs (blood work) drawn today and your tests are completely normal, you will receive your results only by: MyChart Message (if you have MyChart) OR A paper copy in the mail If you have any lab test that is abnormal or we need to change your treatment, we will call you to review the results.   Testing/Procedures: None ordered.    Follow-Up: At [facility], you and your health needs are our priority.  As part of our continuing mission to provide you with exceptional heart care, we have created designated Provider Care Teams.  These Care Teams include your primary Cardiologist (physician) and Advanced Practice Providers (APPs -  Physician Assistants and Nurse Practitioners) who all work together to provide you with the care you need, when you need it.  We recommend signing up for the patient portal called "MyChart".  Sign up information is provided on this After Visit Summary.  MyChart is used to connect with patients for Virtual Visits (Telemedicine).  Patients are able to view lab/test results, encounter notes, upcoming appointments, etc.  Non-urgent messages can be sent to your provider as well.   To learn more about what you can do with MyChart, go to [email or web address].    Your next appointment:   6 months with Dr Graciela Husbands

## 2023-04-26 NOTE — Progress Notes (Signed)
no     Patient Care Team: Mahlon Gammon, MD as PCP - General (Internal Medicine) Duke Salvia, MD as PCP - Cardiology (Cardiology) Duke Salvia, MD as PCP - Electrophysiology (Cardiology) Marcine Matar, MD (Urology) [place], [name], MD (Dermatology) Mateo Flow, MD as Consulting Physician (Ophthalmology) Duke Salvia, MD as Consulting Physician (Cardiology) Pyrtle, Carie Caddy, MD as Consulting Physician (Gastroenterology) Kalman Shan, MD as Consulting Physician (Pulmonary Disease) Kathyrn Sheriff, [facility] (Inactive) as Pharmacist (Pharmacist)   HPI  Donald [name] is a 89 y.o. male Seem in followup for persistent atrial fibrillation and Abbott  pacemaker placed 10/17 for tachybradycardia syndrome   Anticoagulated with apixaban no bleeding.  Rhythm control with dofetilide since 1/20 The patient denies chest pain, shortness of breath, nocturnal dyspne, orthopnea.  There have been no palpitations, lightheadedness or syncope.  Complains of mild edema .   He is status post surgery for stage III colon cancer.  11/22.  Imaging concerning for peritoneal recurrence   DATE TEST EF   10/15    Echo  55 %  mild AS ( mean gradient 10) --? Bicuspid aortic valve  9/16 myoview 60-65%   mild ischemia  10/17 Echo 60-65%  No comment on bicuspid valve//mild - Mod  AI   11/18 Cath 55-65% Mod non obstructive CAD   1/20 Echo 60-65% LAE (52/2.5/45)  10/22 Echo   60-65% BAE     Date Cr K Mg Hgb  10/17 0.97  4.6  13.2  4/18 1.07 5.2  13.9  1/19 1.26 5.1  14  1/20 1.04 4.7 2.1 13.6  10/20 0.97 4.8 2.0 14.0  3/23 1.00 4.5 1.7 (5/23) 13.7  11/23 0.99 4.9    10/24 0.96 4.8  14.9 (6.24)     thromboembolic risk factors ( age  -2, HTN-1, ) for a CHADSVASc Score of 3       Past Medical History:  Diagnosis Date   Abnormal CT scan, stomach 02/2014   Thickening of gastric fundus and cardia.  gastritis on EGD 03/2014   Allergic rhinitis due to pollen    Anemia 12/2020   Arthritis     joints,finger   Arthropathy, unspecified, site unspecified    Atherosclerosis    a. Noted by abdominal CT 02/2014 (h/o normal nuc 2011).   Atrial flutter (HCC) 02/24/2014   s/p ablation 11/15   Blood transfusion without reported diagnosis    Cardiomyopathy (HCC)    tachy mediated - a. TEE (10/15):  EF 30%;  b. Echo after NSR restored (10/15):  mild LVH, EF 55-60%, mild AS, mild AI, mild MR, mild to mod LAE, mild RAE   Cataract    bilateral,removed replaced with implants   CHF (congestive heart failure) (HCC) 01/04/2021   COVID-19 11/2020   Diverticulosis    severe in descending, sigmoid colon.    ED (erectile dysfunction)    Emphysema of lung (HCC)    Esophageal stricture 03/2014   traversable with endoscope. not dilated.    Gastric AVM    GERD (gastroesophageal reflux disease) 03/2014   small HH and gastritis on EGD   GI bleed    Habitual alcohol use    Hemorrhoids    Hyperlipidemia    Hypertension    Obesity    OSA on CPAP    PAF (paroxysmal atrial fibrillation) (HCC)    Pneumonia 12/2020   mild with covid   Presence of permanent cardiac pacemaker    Prostatitis, unspecified  Pulmonary fibrosis (HCC)    Sinus bradycardia    a. s/p STJ dual chamber PPM   Skin cancer    basal and squamous cell   Sleep apnea    Substance abuse (HCC)    etoh    Past Surgical History:  Procedure Laterality Date   ATRIAL FLUTTER ABLATION N/A 03/19/2014   RFCA of atrial flutter by Dr Graciela Husbands   CARDIOVERSION N/A 02/24/2014   Procedure: CARDIOVERSION;  Surgeon: Lewayne Bunting, MD;  Location: [facility] ENDOSCOPY;  Service: Cardiovascular;  Laterality: N/A;   CARDIOVERSION Right 09/10/2014   Procedure: CARDIOVERSION;  Surgeon: Duke Salvia, MD;  Location: [facility] CATH LAB;  Service: Cardiovascular;  Laterality: Right;   CARDIOVERSION N/A 01/22/2018   Procedure: CARDIOVERSION;  Surgeon: Pricilla Riffle, MD;  Location: [facility] ENDOSCOPY;  Service: Cardiovascular;  Laterality: N/A;   COLECTOMY   03/24/2021   COLONOSCOPY N/A 04/23/2015   Procedure: COLONOSCOPY;  Surgeon: Meryl Dare, MD;  Location: [facility] ENDOSCOPY;  Service: Endoscopy;  Laterality: N/A;   COLONOSCOPY     ELBOW SURGERY Right    SCREW PLACED   ENTEROSCOPY N/A 06/12/2015   Procedure: ENTEROSCOPY;  Surgeon: Meryl Dare, MD;  Location: [facility] ENDOSCOPY;  Service: Endoscopy;  Laterality: N/A;   EP IMPLANTABLE DEVICE N/A 02/15/2016   Procedure: Pacemaker Implant;  Surgeon: Marinus Maw, MD;  Location: [facility] INVASIVE CV LAB;  Service: Cardiovascular;  Laterality: N/A;   ESOPHAGOGASTRODUODENOSCOPY N/A 04/09/2014   Procedure: ESOPHAGOGASTRODUODENOSCOPY (EGD);  Surgeon: Louis Meckel, MD;  Location: [facility] ENDOSCOPY;  Service: Endoscopy;  Laterality: N/A;   ESOPHAGOGASTRODUODENOSCOPY N/A 04/23/2015   Procedure: ESOPHAGOGASTRODUODENOSCOPY (EGD);  Surgeon: Meryl Dare, MD;  Location: [facility] ENDOSCOPY;  Service: Endoscopy;  Laterality: N/A;   INGUINAL HERNIA REPAIR     right   INGUINAL HERNIA REPAIR     left   LEFT HEART CATH AND CORONARY ANGIOGRAPHY N/A 03/16/2017   Procedure: LEFT HEART CATH AND CORONARY ANGIOGRAPHY;  Surgeon: Kathleene Hazel, MD;  Location: [facility] INVASIVE CV LAB;  Service: Cardiovascular;  Laterality: N/A;   ORIF fracture of the elbow  1992   SKIN CANCER EXCISION  05/21/2012   Squamous cell ca   TEE WITHOUT CARDIOVERSION N/A 02/24/2014   Procedure: TRANSESOPHAGEAL ECHOCARDIOGRAM (TEE);  Surgeon: Lewayne Bunting, MD;  Location: [facility] ENDOSCOPY;  Service: Cardiovascular;  Laterality: N/A;   TONSILLECTOMY     AGE 55   UPPER GASTROINTESTINAL ENDOSCOPY      Current Outpatient Medications  Medication Sig Dispense Refill   acetaminophen (TYLENOL) 500 MG tablet Take 500 mg by mouth as needed for moderate pain or headache.     albuterol (VENTOLIN HFA) 108 (90 Base) MCG/ACT inhaler INHALE TWO PUFFS INTO THE LUNGS EVERY 6 HOURS AS NEEDED FOR WHEEZEING OR SHORTNESS OF BREATH 8.5 g 3   apixaban (ELIQUIS) 5 MG TABS  tablet Take 1 tablet (5 mg total) by mouth 2 (two) times daily. 60 tablet 5   carboxymethylcellulose (REFRESH PLUS) 0.5 % SOLN Place 1 drop into both eyes daily as needed (dry eyes).     colestipol (COLESTID) 5 g granules Take 5 g by mouth daily. 500 g 12   diltiazem (CARDIZEM CD) 120 MG 24 hr capsule Take 1 capsule (120 mg total) by mouth 2 (two) times daily, at breakfast and at bedtime. 180 capsule 3   dofetilide (TIKOSYN) 500 MCG capsule Take 1 capsule (500 mcg total) by mouth 2 (two) times daily. 180 capsule 3   furosemide (LASIX) 20  MG tablet Take 1 tablet (20 mg total) by mouth 2 (two) times daily. 180 tablet 3   magnesium oxide (MAG-OX) 400 MG tablet TAKE ONE TABLET BY MOUTH DAILY (Patient taking differently: Take 400 mg by mouth at bedtime.) 30 tablet 6   metoprolol tartrate (LOPRESSOR) 25 MG tablet Take 1 tablet (25 mg total) by mouth 2 (two) times daily. 180 tablet 3   metroNIDAZOLE (METROGEL) 0.75 % gel Apply 1 Application topically 2 (two) times daily as needed. To nose     Pedialyte (PEDIALYTE) SOLN Take 178 mLs by mouth daily. 6 oz     rosuvastatin (CRESTOR) 10 MG tablet Take 1 tablet (10 mg total) by mouth daily. 90 tablet 3   sildenafil (REVATIO) 20 MG tablet Take 20 mg by mouth daily as needed (erectile dysfunction).     sodium chloride (OCEAN) 0.65 % SOLN nasal spray Place 1 spray into both nostrils as needed for congestion.     No current facility-administered medications for this visit.    No Known Allergies  Review of Systems negative except from HPI and PMH  Physical Exam BP 110/70   Pulse 60   Ht 5\' 8"  (1.727 m)   Wt 219 lb 12.8 oz (99.7 kg)   SpO2 96%   BMI 33.42 kg/m  Well developed and well nourished in no acute distress HENT normal Neck supple  Clear Device pocket well healed; without hematoma or erythema.  There is no tethering  Regular rate and rhythm, no  gallop 2/6 murmur Abd-soft with active BS No Clubbing cyanosis tr edema Skin-warm and dry A &  Oriented  Grossly normal sensory and motor function  ECG atrial pacing at 60 with intrinsic and should Intervals 22/09/43  Device function is normal. Programming changes none  See Paceart for details          Assessment and  Plan  Atrial fibrillation/flutter-paroxysmal  CHADS-VASc score 5 (age-9 hypertension-1 cardiomyopathy-1 vascular disease-1)  Aortic Valve Bicuspid previously described but no longer evident  Cardiomyopathy-resolved  Pacemaker-[employer]     Hypertension  HFpEF-chronic  Exertional dyspnea Cath non obstructive disease  Dofetilide   High Risk Medication Surveillance  Question cancer recurrence   Holding sinus rhythm.  Continue dofetilide.  500 mcg.  No bleeding.  Continue Eliquis 5 twice daily  A little bit of peripheral edema.  We will change his diuretics from 20 twice a day to 40 daily.

## 2023-04-27 LAB — BASIC METABOLIC PANEL
BUN/Creatinine Ratio: 22 (ref 10–24)
BUN: 22 mg/dL (ref 8–27)
CO2: 22 mmol/L (ref 20–29)
Calcium: 9.5 mg/dL (ref 8.6–10.2)
Chloride: 102 mmol/L (ref 96–106)
Creatinine, Ser: 1.02 mg/dL (ref 0.76–1.27)
Glucose: 96 mg/dL (ref 70–99)
Potassium: 5.1 mmol/L (ref 3.5–5.2)
Sodium: 141 mmol/L (ref 134–144)
eGFR: 71 mL/min/{1.73_m2} (ref >59)

## 2023-04-28 DIAGNOSIS — C44319 Basal cell carcinoma of skin of other parts of face: Secondary | ICD-10-CM | POA: Diagnosis not present

## 2023-04-28 DIAGNOSIS — D485 Neoplasm of uncertain behavior of skin: Secondary | ICD-10-CM | POA: Diagnosis not present

## 2023-04-28 DIAGNOSIS — D692 Other nonthrombocytopenic purpura: Secondary | ICD-10-CM | POA: Diagnosis not present

## 2023-04-28 DIAGNOSIS — Z85828 Personal history of other malignant neoplasm of skin: Secondary | ICD-10-CM | POA: Diagnosis not present

## 2023-04-28 DIAGNOSIS — L570 Actinic keratosis: Secondary | ICD-10-CM | POA: Diagnosis not present

## 2023-04-28 DIAGNOSIS — L821 Other seborrheic keratosis: Secondary | ICD-10-CM | POA: Diagnosis not present

## 2023-04-28 DIAGNOSIS — D225 Melanocytic nevi of trunk: Secondary | ICD-10-CM | POA: Diagnosis not present

## 2023-04-28 DIAGNOSIS — C44519 Basal cell carcinoma of skin of other part of trunk: Secondary | ICD-10-CM | POA: Diagnosis not present

## 2023-04-30 LAB — CUP PACEART INCLINIC DEVICE CHECK
Date Time Interrogation Session: 20241211121032
Implantable Lead Connection Status: 753985
Implantable Lead Connection Status: 753985
Implantable Lead Implant Date: 20171002
Implantable Lead Implant Date: 20171002
Implantable Lead Location: 753859
Implantable Lead Location: 753860
Implantable Pulse Generator Implant Date: 20171002
Pulse Gen Model: 2272
Pulse Gen Serial Number: 7951215

## 2023-05-01 ENCOUNTER — Encounter: Payer: Self-pay | Admitting: Adult Health

## 2023-05-01 ENCOUNTER — Non-Acute Institutional Stay: Payer: PPO | Admitting: Adult Health

## 2023-05-01 VITALS — BP 130/82 | HR 60 | Temp 98.0°F | Resp 17 | Ht 68.0 in | Wt 220.0 lb

## 2023-05-01 DIAGNOSIS — I5032 Chronic diastolic (congestive) heart failure: Secondary | ICD-10-CM

## 2023-05-01 DIAGNOSIS — I2510 Atherosclerotic heart disease of native coronary artery without angina pectoris: Secondary | ICD-10-CM

## 2023-05-01 DIAGNOSIS — G4733 Obstructive sleep apnea (adult) (pediatric): Secondary | ICD-10-CM

## 2023-05-01 DIAGNOSIS — E785 Hyperlipidemia, unspecified: Secondary | ICD-10-CM | POA: Diagnosis not present

## 2023-05-01 DIAGNOSIS — Z950 Presence of cardiac pacemaker: Secondary | ICD-10-CM

## 2023-05-01 DIAGNOSIS — C182 Malignant neoplasm of ascending colon: Secondary | ICD-10-CM

## 2023-05-01 DIAGNOSIS — J849 Interstitial pulmonary disease, unspecified: Secondary | ICD-10-CM

## 2023-05-01 DIAGNOSIS — I10 Essential (primary) hypertension: Secondary | ICD-10-CM

## 2023-05-01 MED ORDER — MAGNESIUM OXIDE 400 MG PO TABS: 400.0000 mg | ORAL_TABLET | Freq: Every day | ORAL | Status: DC

## 2023-05-01 NOTE — Patient Instructions (Signed)
Please get a Complete blood count and lipid panel

## 2023-05-01 NOTE — Progress Notes (Signed)
Location:  [facility]  POS: Clinic  Provider: Fletcher Anon, ANP  Goals of Care:     05/01/2023    3:07 PM  Advanced Directives  Does Patient Have a Medical Advance Directive? Yes  Type of Estate agent of [place];Living will  Does patient want to make changes to medical advance directive? No - Patient declined  Copy of Healthcare Power of Attorney in Chart? Yes - validated most recent copy scanned in chart (See row information)     Chief Complaint  Patient presents with   Medical Management of Chronic Issues    Patient is being seen for a 6 month follow up    HPI: Patient is a 89 y.o. male seen today for medical management of chronic diseases.    PMH significant for aflutter s/p ablation, cardiomyopathy, CHF, gastritis, anemia, COPD, OSA, pulmonary fibrosis, AVM, pacemaker, GERD, GIB, HLD, HTN, afib.  hx of Colon cancer-stage III (T3N1), cecum mass biopsy 02/03/2021 Adenocarcinoma.  03/24/2021, robotic assisted laparoscopic right colectomy Received Xeloda, now followed by Dr. Truett Perna for surveillance.  He was found to have new mesenteric implants on a surveillance CT 04/06/23. The PET reveals hypermetabolism associated with these lesions consistent with metastatic disease. Per Dr Kalman Drape note trying chemo vs observation was discussed. They submitted NGS testing. And he is follow up with him this week   No bloody stool, weight loss, or abd pain.  Takes colestid for loose stools which is helping.   Reports some issues with memory loss remembering names. He is still driving. His wife is having memory loss also.   Follows with Dr Vassie Loll for sleep apnea on CPAP and Dr Isaiah Serge for fibrosis.  No difficulty breathing. Uses albuterol twice a day more for prevention than symptoms. No issues sleeping. Sleeps 9+ hrs per night.   Goes to water aerobics each day  CT of the chest 04/06/23  No substantial change in the appearance of the lungs which is once  again categorized as indeterminate for usual interstitial pneumonia (UIP) per current ATS guidelines. Findings are once again favored to reflect mild nonspecific interstitial pneumonia (NSIP).  Past Medical History:  Diagnosis Date   Abnormal CT scan, stomach 02/2014   Thickening of gastric fundus and cardia.  gastritis on EGD 03/2014   Allergic rhinitis due to pollen    Anemia 12/2020   Arthritis    joints,finger   Arthropathy, unspecified, site unspecified    Atherosclerosis    a. Noted by abdominal CT 02/2014 (h/o normal nuc 2011).   Atrial flutter (HCC) 02/24/2014   s/p ablation 11/15   Blood transfusion without reported diagnosis    Cardiomyopathy (HCC)    tachy mediated - a. TEE (10/15):  EF 30%;  b. Echo after NSR restored (10/15):  mild LVH, EF 55-60%, mild AS, mild AI, mild MR, mild to mod LAE, mild RAE   Cataract    bilateral,removed replaced with implants   CHF (congestive heart failure) (HCC) 01/04/2021   COVID-19 11/2020   Diverticulosis    severe in descending, sigmoid colon.    ED (erectile dysfunction)    Emphysema of lung (HCC)    Esophageal stricture 03/2014   traversable with endoscope. not dilated.    Gastric AVM    GERD (gastroesophageal reflux disease) 03/2014   small HH and gastritis on EGD   GI bleed    Habitual alcohol use    Hemorrhoids    Hyperlipidemia    Hypertension    Obesity  OSA on CPAP    PAF (paroxysmal atrial fibrillation) (HCC)    Pneumonia 12/2020   mild with covid   Presence of permanent cardiac pacemaker    Prostatitis, unspecified    Pulmonary fibrosis (HCC)    Sinus bradycardia    a. s/p STJ dual chamber PPM   Skin cancer    basal and squamous cell   Sleep apnea    Substance abuse (HCC)    etoh    Past Surgical History:  Procedure Laterality Date   ATRIAL FLUTTER ABLATION N/A 03/19/2014   RFCA of atrial flutter by Dr Graciela Husbands   CARDIOVERSION N/A 02/24/2014   Procedure: CARDIOVERSION;  Surgeon: Lewayne Bunting, MD;   Location: [facility] ENDOSCOPY;  Service: Cardiovascular;  Laterality: N/A;   CARDIOVERSION Right 09/10/2014   Procedure: CARDIOVERSION;  Surgeon: Duke Salvia, MD;  Location: [facility] CATH LAB;  Service: Cardiovascular;  Laterality: Right;   CARDIOVERSION N/A 01/22/2018   Procedure: CARDIOVERSION;  Surgeon: Pricilla Riffle, MD;  Location: [facility] ENDOSCOPY;  Service: Cardiovascular;  Laterality: N/A;   COLECTOMY  03/24/2021   COLONOSCOPY N/A 04/23/2015   Procedure: COLONOSCOPY;  Surgeon: Meryl Dare, MD;  Location: [facility] ENDOSCOPY;  Service: Endoscopy;  Laterality: N/A;   COLONOSCOPY     ELBOW SURGERY Right    SCREW PLACED   ENTEROSCOPY N/A 06/12/2015   Procedure: ENTEROSCOPY;  Surgeon: Meryl Dare, MD;  Location: [facility] ENDOSCOPY;  Service: Endoscopy;  Laterality: N/A;   EP IMPLANTABLE DEVICE N/A 02/15/2016   Procedure: Pacemaker Implant;  Surgeon: Marinus Maw, MD;  Location: [facility] INVASIVE CV LAB;  Service: Cardiovascular;  Laterality: N/A;   ESOPHAGOGASTRODUODENOSCOPY N/A 04/09/2014   Procedure: ESOPHAGOGASTRODUODENOSCOPY (EGD);  Surgeon: Louis Meckel, MD;  Location: [facility] ENDOSCOPY;  Service: Endoscopy;  Laterality: N/A;   ESOPHAGOGASTRODUODENOSCOPY N/A 04/23/2015   Procedure: ESOPHAGOGASTRODUODENOSCOPY (EGD);  Surgeon: Meryl Dare, MD;  Location: [facility] ENDOSCOPY;  Service: Endoscopy;  Laterality: N/A;   INGUINAL HERNIA REPAIR     right   INGUINAL HERNIA REPAIR     left   LEFT HEART CATH AND CORONARY ANGIOGRAPHY N/A 03/16/2017   Procedure: LEFT HEART CATH AND CORONARY ANGIOGRAPHY;  Surgeon: Kathleene Hazel, MD;  Location: [facility] INVASIVE CV LAB;  Service: Cardiovascular;  Laterality: N/A;   ORIF fracture of the elbow  1992   SKIN CANCER EXCISION  05/21/2012   Squamous cell ca   TEE WITHOUT CARDIOVERSION N/A 02/24/2014   Procedure: TRANSESOPHAGEAL ECHOCARDIOGRAM (TEE);  Surgeon: Lewayne Bunting, MD;  Location: [facility] ENDOSCOPY;  Service: Cardiovascular;  Laterality: N/A;   TONSILLECTOMY     AGE 21    UPPER GASTROINTESTINAL ENDOSCOPY      No Known Allergies  Outpatient Encounter Medications as of 05/01/2023  Medication Sig   acetaminophen (TYLENOL) 500 MG tablet Take 500 mg by mouth as needed for moderate pain or headache.   albuterol (VENTOLIN HFA) 108 (90 Base) MCG/ACT inhaler INHALE TWO PUFFS INTO THE LUNGS EVERY 6 HOURS AS NEEDED FOR WHEEZEING OR SHORTNESS OF BREATH   apixaban (ELIQUIS) 5 MG TABS tablet Take 1 tablet (5 mg total) by mouth 2 (two) times daily.   carboxymethylcellulose (REFRESH PLUS) 0.5 % SOLN Place 1 drop into both eyes daily as needed (dry eyes).   colestipol (COLESTID) 5 g granules Take 5 g by mouth daily.   diltiazem (CARDIZEM CD) 120 MG 24 hr capsule Take 1 capsule (120 mg total) by mouth 2 (two) times daily, at breakfast and at bedtime.  dofetilide (TIKOSYN) 500 MCG capsule Take 1 capsule (500 mcg total) by mouth 2 (two) times daily.   furosemide (LASIX) 20 MG tablet Take 1 tablet (20 mg total) by mouth 2 (two) times daily.   metoprolol tartrate (LOPRESSOR) 25 MG tablet Take 1 tablet (25 mg total) by mouth 2 (two) times daily.   Pedialyte (PEDIALYTE) SOLN Take 178 mLs by mouth daily. 6 oz   rosuvastatin (CRESTOR) 10 MG tablet Take 1 tablet (10 mg total) by mouth daily.   sildenafil (REVATIO) 20 MG tablet Take 20 mg by mouth daily as needed (erectile dysfunction).   sodium chloride (OCEAN) 0.65 % SOLN nasal spray Place 1 spray into both nostrils as needed for congestion.   [DISCONTINUED] magnesium oxide (MAG-OX) 400 MG tablet TAKE ONE TABLET BY MOUTH DAILY (Patient taking differently: Take 400 mg by mouth at bedtime.)   magnesium oxide (MAG-OX) 400 MG tablet Take 1 tablet (400 mg total) by mouth at bedtime.   [DISCONTINUED] metroNIDAZOLE (METROGEL) 0.75 % gel Apply 1 Application topically 2 (two) times daily as needed. To nose (Patient not taking: Reported on 05/01/2023)   No facility-administered encounter medications on file as of 05/01/2023.    Review of  Systems:  Review of Systems  Constitutional:  Negative for activity change, appetite change, chills, diaphoresis, fatigue, fever and unexpected weight change.  Respiratory:  Negative for cough, shortness of breath, wheezing and stridor.   Cardiovascular:  Negative for chest pain, palpitations and leg swelling.  Gastrointestinal:  Positive for diarrhea (intermittent). Negative for abdominal distention, abdominal pain and constipation.  Genitourinary:  Negative for difficulty urinating and dysuria.  Musculoskeletal:  Positive for back pain. Negative for arthralgias, gait problem, joint swelling and myalgias.  Neurological:  Negative for dizziness, seizures, syncope, facial asymmetry, speech difficulty, weakness and headaches.  Hematological:  Negative for adenopathy. Does not bruise/bleed easily.  Psychiatric/Behavioral:  Negative for agitation, behavioral problems and confusion.     Health Maintenance  Topic Date Due   COVID-19 Vaccine (9 - 2024-25 season) 03/28/2023   Medicare Annual Wellness (AWV)  11/28/2023   DTaP/Tdap/Td (3 - Td or Tdap) 04/16/2028   Pneumonia Vaccine 3+ Years old  Completed   INFLUENZA VACCINE  Completed   Zoster Vaccines- Shingrix  Completed   HPV VACCINES  Aged Out    Physical Exam: Vitals:   05/01/23 1503  BP: 130/82  Pulse: 60  Resp: 17  Temp: 98 F (36.7 C)  TempSrc: Temporal  Weight: 220 lb (99.8 kg)  Height: 5\' 8"  (1.727 m)    Body mass index is 33.45 kg/m. Wt Readings from Last 3 Encounters:  05/01/23 220 lb (99.8 kg)  04/26/23 219 lb 12.8 oz (99.7 kg)  04/11/23 224 lb 6.4 oz (101.8 kg)    Physical Exam Vitals and nursing note reviewed.  Constitutional:      Appearance: Normal appearance.  HENT:     Head: Normocephalic and atraumatic.     Right Ear: Tympanic membrane normal.     Left Ear: Tympanic membrane normal.     Nose: Nose normal.     Mouth/Throat:     Mouth: Mucous membranes are moist.     Pharynx: Oropharynx is clear.   Eyes:     Conjunctiva/sclera: Conjunctivae normal.     Pupils: Pupils are equal, round, and reactive to light.     Comments: Dry, erythema to sclera  Cardiovascular:     Rate and Rhythm: Normal rate and regular rhythm.     Heart sounds:  No murmur heard. Pulmonary:     Effort: Pulmonary effort is normal.     Breath sounds: Normal breath sounds.  Abdominal:     General: Bowel sounds are normal. There is no distension.     Palpations: Abdomen is soft.     Tenderness: There is no abdominal tenderness.  Musculoskeletal:     Cervical back: No rigidity.     Comments: Trace edema to ankles.   Lymphadenopathy:     Cervical: No cervical adenopathy.  Skin:    General: Skin is warm and dry.  Neurological:     General: No focal deficit present.     Mental Status: He is alert and oriented to person, place, and time. Mental status is at baseline.  Psychiatric:        Mood and Affect: Mood normal.     Labs reviewed: Basic Metabolic Panel: Recent Labs    03/14/23 1156 03/14/23 1222 04/26/23 1331  NA 138  --  141  K 4.8  --  5.1  CL 103  --  102  CO2 29  --  22  GLUCOSE 95  --  96  BUN 19  --  22  CREATININE 0.96 1.10 1.02  CALCIUM 10.2  --  9.5   Liver Function Tests: No results for input(s): "AST", "ALT", "ALKPHOS", "BILITOT", "PROT", "ALBUMIN" in the last 8760 hours.  No results for input(s): "LIPASE", "AMYLASE" in the last 8760 hours. No results for input(s): "AMMONIA" in the last 8760 hours. CBC: Recent Labs    10/24/22 1139  WBC 9.6  HGB 14.9  HCT 45.0  MCV 95  PLT 186   Lipid Panel: No results for input(s): "CHOL", "HDL", "LDLCALC", "TRIG", "CHOLHDL", "LDLDIRECT" in the last 8760 hours.  Lab Results  Component Value Date   HGBA1C 5.5 03/08/2021    Procedures since last visit: CUP PACEART INCLINIC DEVICE CHECK Result Date: 04/30/2023 Dual Chamber Pacemaker check in clinic. Normal device function. One episode of aflutter lasting 14 seconds. 3 episodes of AF  (essentially the same eopisode) lasting ~6 hours. Not the best V rate control during this time.  On OAC. No high ventricular rates noted. Histogram distribution appropriate for patient activity level. Estimated longevity 2.7-3.1 years .  Patient education completed. No changes made.Raj Janus, RN  NM PET Image Initial (PI) Skull Base To Thigh (F-18 FDG) Addendum Date: 04/12/2023 ADDENDUM REPORT: 04/12/2023 08:23 ADDENDUM: Upon further review in the right-side of the retroperitoneum inferior to the right kidney and lateral to the right psoas muscle (axial image 149 of series 4) there is a 1.5 x 1.1 cm soft tissue attenuation nodule which is hypermetabolic (SUVmax = 6.2), presumably an additional metastatic implant. Electronically Signed   By: Trudie Reed M.D.   On: 04/12/2023 08:23   Result Date: 04/12/2023 CLINICAL DATA:  Initial treatment strategy for colon cancer. EXAM: NUCLEAR MEDICINE PET SKULL BASE TO THIGH TECHNIQUE: 11.0 mCi F-18 FDG was injected intravenously. Full-ring PET imaging was performed from the skull base to thigh after the radiotracer. CT data was obtained and used for attenuation correction and anatomic localization. Fasting blood glucose: 99 mg/dl COMPARISON:  CT of the abdomen and pelvis 03/14/2023. CT of the chest, abdomen and pelvis 02/14/2022. FINDINGS: Mediastinal blood pool activity: SUV max 3.0 Liver activity: SUV max 3.6 NECK: No hypermetabolic lymph nodes in the neck. Incidental CT findings: None. CHEST: Mild focal hypermetabolism in the distal esophagus (SUVmax = 4.4) without readily apparent soft tissue mass on the CT portion  of the examination. No hypermetabolic mediastinal or hilar nodes. No suspicious pulmonary nodules on the CT scan. Incidental CT findings: Chronic interstitial lung changes, similar to prior studies (better depicted on contemporaneously obtained high-resolution chest CT). Mild emphysema. Atherosclerotic calcifications in the thoracic aorta as well as  the coronary arteries. Calcifications of the aortic valve and mitral annulus. ABDOMEN/PELVIS: Correlate defined soft tissue mass in the small bowel mesentery slightly to the right of midline (axial image 133 of series 4) measuring 5.0 x 4.5 cm demonstrating heterogeneous areas of hypermetabolism (SUVmax = 8.5), compatible with a metastatic lesion (either a metastatic peritoneal deposit or metastatic lymphadenopathy). No abnormal hypermetabolic activity within the liver, pancreas, adrenal glands, or spleen. Incidental CT findings: Status post partial right hemicolectomy. Aortic atherosclerosis. Low-attenuation lesions in the liver and kidneys which demonstrate no hypermetabolism, incompletely characterized on today's noncontrast CT examination, but statistically likely cysts (no imaging follow-up recommended). Atherosclerotic calcifications in the abdominal aorta and pelvic vasculature. Colonic diverticulosis without evidence of acute diverticulitis at this time. SKELETON: No focal hypermetabolic activity to suggest skeletal metastasis. Incidental CT findings: None. IMPRESSION: 1. Poorly defined soft tissue mass in the small bowel mesentery concerning for metastasis in this patient with history of colon cancer. Whether this represents a intraperitoneal metastatic deposit or metastatic lymphadenopathy is uncertain. 2. No other distal sites of metastatic disease noted elsewhere in the neck, chest, abdomen or pelvis. 3. Mild focal hypermetabolism in the distal esophagus. No discrete mass identified on the CT portion of the examination. This is nonspecific, and could be physiologic, but correlation with follow-up nonemergent endoscopy should be considered if clinically appropriate. 4. Aortic atherosclerosis, in addition to left main and three-vessel coronary artery disease. 5. There are calcifications of the aortic valve and mitral annulus. Echocardiographic correlation for evaluation of potential valvular dysfunction  may be warranted if clinically indicated. 6. Colonic diverticulosis. Electronically Signed: By: Trudie Reed M.D. On: 04/11/2023 07:46   CT CHEST HIGH RESOLUTION Result Date: 04/11/2023 CLINICAL DATA:  89 year old male with clinical concern for interstitial lung disease. Additional history of colon cancer. * Tracking Code: BO * EXAM: CT CHEST WITHOUT CONTRAST TECHNIQUE: Multidetector CT imaging of the chest was performed following the standard protocol without intravenous contrast. High resolution imaging of the lungs, as well as inspiratory and expiratory imaging, was performed. RADIATION DOSE REDUCTION: This exam was performed according to the departmental dose-optimization program which includes automated exposure control, adjustment of the mA and/or kV according to patient size and/or use of iterative reconstruction technique. COMPARISON:  High-resolution chest CT 02/16/2021. FINDINGS: Cardiovascular: Heart size is mildly enlarged with left atrial dilatation. There is no significant pericardial fluid, thickening or pericardial calcification. There is aortic atherosclerosis, as well as atherosclerosis of the great vessels of the mediastinum and the coronary arteries, including calcified atherosclerotic plaque in the left main, left anterior descending, left circumflex and right coronary arteries. Calcifications of the aortic valve and mitral annulus. Left-sided pacemaker/AICD with lead tips terminating in the right atrium and right ventricular apex. Mediastinum/Nodes: No pathologically enlarged mediastinal or hilar lymph nodes. Small hiatal hernia. No axillary lymphadenopathy. Lungs/Pleura: High-resolution images again demonstrate mild areas of peripheral predominant ground-glass attenuation, septal thickening and subpleural reticulation scattered throughout the lungs bilaterally with no discernible craniocaudal gradient and no substantial progression compared to the prior examination. No associated  traction bronchiectasis or honeycombing. Inspiratory and expiratory imaging is unremarkable. No acute consolidative airspace disease. No pleural effusions. No definite suspicious appearing pulmonary nodules or masses are noted. There is  also mild diffuse bronchial wall thickening with mild centrilobular and paraseptal emphysema. Upper Abdomen: Aortic atherosclerosis. Low-attenuation lesions in the liver, incompletely characterized on today's noncontrast CT examination, but statistically likely to represent cysts (no imaging follow-up recommended), measuring up to 3.9 x 1.9 cm in segment 6. Exophytic low-attenuation lesion in the upper pole of the right kidney also incompletely characterized on today's noncontrast CT examination, but statistically likely a cyst (no imaging follow-up recommended) measuring up to 3.8 cm. Musculoskeletal: There are no aggressive appearing lytic or blastic lesions noted in the visualized portions of the skeleton. IMPRESSION: 1. No substantial change in the appearance of the lungs which is once again categorized as indeterminate for usual interstitial pneumonia (UIP) per current ATS guidelines. Findings are once again favored to reflect mild nonspecific interstitial pneumonia (NSIP). 2. There is also mild diffuse bronchial wall thickening with mild centrilobular and paraseptal emphysema; imaging findings suggestive of underlying COPD. 3. Aortic atherosclerosis, in addition to left main and three-vessel coronary artery disease. 4. There are calcifications of the aortic valve and mitral annulus. Echocardiographic correlation for evaluation of potential valvular dysfunction may be warranted if clinically indicated. 5. Cardiomegaly with left atrial dilatation. Aortic Atherosclerosis (ICD10-I70.0) and Emphysema (ICD10-J43.9). Electronically Signed   By: Trudie Reed M.D.   On: 04/11/2023 07:33    Assessment/Plan  1. Malignant neoplasm of ascending colon (HCC) (Primary) S/p resection  and chemo Now with recurrence Follow up with Dr Truett Perna for treatment plan  2. Essential hypertension Controlled   3. Coronary artery disease involving native coronary artery of native heart without angina pectoris On statin and DOAC  4. Chronic diastolic congestive heart failure (HCC) No current issues On lasix.   5. ILD (interstitial lung disease) (HCC) Followed by pulmonary Dr Isaiah Serge Discussed using albuterol if needed   6. OSA (obstructive sleep apnea) ON CPAP Followed by Dr Vassie Loll  7. Hyperlipidemia with target LDL less than 100 On statin Needs lipid panel   8. Pacemaker Followed by Dr Graciela Husbands   Labs/tests ordered:  * No order type specified * Recommend CBC and Lipid panel. He is having blood drawn likely this week if not will order here.   Next appt:  6 months with Dr Chales Abrahams.    Total time :  time greater than 50% of total time spent doing pt counseling, chart review, coordination of care.

## 2023-05-03 ENCOUNTER — Inpatient Hospital Stay: Payer: PPO | Attending: Oncology | Admitting: Oncology

## 2023-05-03 VITALS — BP 122/65 | HR 72 | Temp 98.2°F | Resp 18 | Ht 68.0 in | Wt 220.3 lb

## 2023-05-03 DIAGNOSIS — I495 Sick sinus syndrome: Secondary | ICD-10-CM | POA: Insufficient documentation

## 2023-05-03 DIAGNOSIS — C180 Malignant neoplasm of cecum: Secondary | ICD-10-CM | POA: Diagnosis not present

## 2023-05-03 DIAGNOSIS — G8929 Other chronic pain: Secondary | ICD-10-CM | POA: Diagnosis not present

## 2023-05-03 DIAGNOSIS — C182 Malignant neoplasm of ascending colon: Secondary | ICD-10-CM | POA: Diagnosis not present

## 2023-05-03 DIAGNOSIS — Z7901 Long term (current) use of anticoagulants: Secondary | ICD-10-CM | POA: Insufficient documentation

## 2023-05-03 DIAGNOSIS — M549 Dorsalgia, unspecified: Secondary | ICD-10-CM | POA: Diagnosis not present

## 2023-05-03 DIAGNOSIS — J449 Chronic obstructive pulmonary disease, unspecified: Secondary | ICD-10-CM | POA: Insufficient documentation

## 2023-05-03 DIAGNOSIS — D509 Iron deficiency anemia, unspecified: Secondary | ICD-10-CM | POA: Insufficient documentation

## 2023-05-03 DIAGNOSIS — Z85828 Personal history of other malignant neoplasm of skin: Secondary | ICD-10-CM | POA: Diagnosis not present

## 2023-05-03 DIAGNOSIS — J849 Interstitial pulmonary disease, unspecified: Secondary | ICD-10-CM | POA: Insufficient documentation

## 2023-05-03 DIAGNOSIS — G4733 Obstructive sleep apnea (adult) (pediatric): Secondary | ICD-10-CM | POA: Diagnosis not present

## 2023-05-03 DIAGNOSIS — I4891 Unspecified atrial fibrillation: Secondary | ICD-10-CM | POA: Insufficient documentation

## 2023-05-03 DIAGNOSIS — Z950 Presence of cardiac pacemaker: Secondary | ICD-10-CM | POA: Insufficient documentation

## 2023-05-03 DIAGNOSIS — Z87891 Personal history of nicotine dependence: Secondary | ICD-10-CM | POA: Insufficient documentation

## 2023-05-03 NOTE — Progress Notes (Signed)
[name] OFFICE PROGRESS NOTE   Diagnosis: Colon [name]  INTERVAL HISTORY:   [name] returns as scheduled.  He has chronic back pain.  Good appetite.  No new complaint.  He is here today with his wife.  Objective:  Vital signs in last 24 hours:  Blood pressure 122/65, pulse 72, temperature 98.2 F (36.8 C), temperature source Temporal, resp. rate 18, height 5\' 8"  (1.727 m), weight 220 lb 4.8 oz (99.9 kg), SpO2 98%.     Lymphatics: No cervical, supraclavicular, axillary, or inguinal nodes Resp: Lungs clear bilaterally Cardio: Regular rate and rhythm GI: Mild tenderness in the mid abdomen, no mass, no hepatosplenomegaly Vascular: No leg edema  Lab Results:  Lab Results  Component Value Date   WBC 9.6 10/24/2022   HGB 14.9 10/24/2022   HCT 45.0 10/24/2022   MCV 95 10/24/2022   PLT 186 10/24/2022   NEUTROABS 5.8 09/20/2021    CMP  Lab Results  Component Value Date   NA 141 04/26/2023   K 5.1 04/26/2023   CL 102 04/26/2023   CO2 22 04/26/2023   GLUCOSE 96 04/26/2023   BUN 22 04/26/2023   CREATININE 1.02 04/26/2023   CALCIUM 9.5 04/26/2023   PROT 7.1 09/20/2021   ALBUMIN 4.1 09/20/2021   AST 19 09/20/2021   ALT 16 09/20/2021   ALKPHOS 62 09/20/2021   BILITOT 1.0 09/20/2021   GFRNONAA >60 03/14/2023   GFRAA >60 02/25/2019    Lab Results  Component Value Date   CEA 1.97 03/14/2023    Lab Results  Component Value Date   INR 1.52 06/08/2017   LABPROT 18.2 (H) 06/08/2017    Imaging:  No results found.  Medications: I have reviewed the patient's current medications.   Assessment/Plan: Colon [name]-stage III (T3N1), cecum mass biopsy 02/03/2021-adenocarcinoma CTs 02/16/2021-stable interstitial lung disease, mild COPD, cardiomegaly, CAD, multiple liver cysts with a few subcentimeter too small to characterize liver lesions, circumferential wall thickening at the ileocecal valve, right lower quadrant ileocolic lymph node measures 1.1  cm 03/24/2021, robotic assisted laparoscopic right colectomy-T3, 1/8 lymph nodes, 1 tumor deposit, lymphovascular invasion, mismatch repair protein expression intact, MSS: Foundation 1-HRD signature negative, MSS, tumor mutation burden 1, K-ras G12V Cycle 1 adjuvant Xeloda 04/21/2021 Cycle 2 adjuvant Xeloda 05/12/2021 Cycle 3 adjuvant Xeloda 06/07/2021 (5-day delay in start date due to foot discomfort, travel plans) Cycle 4 adjuvant Xeloda 06/29/2021, Xeloda dose reduced to 1000 mg twice daily for 14 days Cycle 5 adjuvant Xeloda 07/19/2021 Cycle 6 adjuvant Xeloda 08/09/2021 Cycle 7 adjuvant Xeloda 08/30/2021 Cycle 8 adjuvant Xeloda 09/20/2021 CTs 02/14/2022-no evidence of recurrent disease CT abdomen/pelvis 03/14/2023- right central mesentery and right iliac fossa implants PET 02/01/2023: Hypermetabolic right small bowel mesenteric mass, mild focal hypermetabolism in the distal esophagus-nonspecific.  The right pelvic nodule is hypermetabolic by my review CAD Interstitial lung disease Iron deficiency anemia secondary to #1 Multiple colon polyps on the colonoscopy 02/03/2021-microscopic fragments of adenocarcinoma involving biopsies of transverse and ascending colon polyps felt to be contaminant from the cecum biopsy 2 tubular adenomas on the right colectomy specimen 03/24/2021 OSA Sick sinus syndrome-pacemaker Atrial fibrillation-maintained on apixaban Chronic diastolic CHF History of multiple skin cancers     Disposition: [name] has a history of colon [name].  There is radiologic evidence of disease progression in the abdominal cavity and pelvis.  I reviewed results of NGS testing with [name] and his wife.  He does not appear to be a candidate for immunotherapy or targeted  therapy.  Systemic chemotherapy +/- bevacizumab is the only treatment option for the metastatic colon [name].  He appears asymptomatic at present.  We decided to continue observation.  He will be referred for a restaging CT prior  to an office visit in 3 months.  He will call in the interim for increased pain or new symptoms.  We discussed FOLFOX/FOLFIRI and capecitabine/bevacizumab as potential systemic treatment options.  Donald Papas, MD  05/03/2023  2:40 PM

## 2023-05-04 DIAGNOSIS — E785 Hyperlipidemia, unspecified: Secondary | ICD-10-CM | POA: Diagnosis not present

## 2023-05-04 DIAGNOSIS — I10 Essential (primary) hypertension: Secondary | ICD-10-CM | POA: Diagnosis not present

## 2023-05-04 DIAGNOSIS — I4891 Unspecified atrial fibrillation: Secondary | ICD-10-CM | POA: Diagnosis not present

## 2023-05-04 LAB — BASIC METABOLIC PANEL
BUN: 25 — AB (ref 4–21)
CO2: 23 — AB (ref 13–22)
Chloride: 104 (ref 99–108)
Creatinine: 1.1 (ref 0.6–1.3)
Glucose: 105
Potassium: 4.9 meq/L (ref 3.5–5.1)
Sodium: 140 (ref 137–147)

## 2023-05-04 LAB — COMPREHENSIVE METABOLIC PANEL
Albumin: 4.3 (ref 3.5–5.0)
Calcium: 9.4 (ref 8.7–10.7)
Globulin: 2.7

## 2023-05-04 LAB — HEPATIC FUNCTION PANEL
ALT: 24 U/L (ref 10–40)
AST: 26 (ref 14–40)
Alkaline Phosphatase: 78 (ref 25–125)

## 2023-05-04 LAB — LIPID PANEL
Cholesterol: 121 (ref 0–200)
HDL: 50 (ref 35–70)
Triglycerides: 74 (ref 40–160)

## 2023-05-04 LAB — TSH: TSH: 0.6 (ref 0.41–5.90)

## 2023-05-07 ENCOUNTER — Other Ambulatory Visit (HOSPITAL_BASED_OUTPATIENT_CLINIC_OR_DEPARTMENT_OTHER): Payer: Self-pay

## 2023-05-08 ENCOUNTER — Encounter: Payer: PPO | Admitting: Adult Health

## 2023-05-15 DIAGNOSIS — N5201 Erectile dysfunction due to arterial insufficiency: Secondary | ICD-10-CM | POA: Diagnosis not present

## 2023-05-15 DIAGNOSIS — R3915 Urgency of urination: Secondary | ICD-10-CM | POA: Diagnosis not present

## 2023-05-15 DIAGNOSIS — N401 Enlarged prostate with lower urinary tract symptoms: Secondary | ICD-10-CM | POA: Diagnosis not present

## 2023-05-16 ENCOUNTER — Ambulatory Visit (INDEPENDENT_AMBULATORY_CARE_PROVIDER_SITE_OTHER): Payer: PPO

## 2023-05-16 DIAGNOSIS — I495 Sick sinus syndrome: Secondary | ICD-10-CM

## 2023-05-17 ENCOUNTER — Other Ambulatory Visit: Payer: Self-pay | Admitting: Internal Medicine

## 2023-05-17 DIAGNOSIS — I4819 Other persistent atrial fibrillation: Secondary | ICD-10-CM

## 2023-05-17 LAB — CUP PACEART REMOTE DEVICE CHECK
Battery Remaining Longevity: 34 mo
Battery Remaining Percentage: 29 %
Battery Voltage: 2.95 V
Brady Statistic AP VP Percent: 1.4 %
Brady Statistic AP VS Percent: 96 %
Brady Statistic AS VP Percent: 1 %
Brady Statistic AS VS Percent: 2.5 %
Brady Statistic RA Percent Paced: 94 %
Brady Statistic RV Percent Paced: 2 %
Date Time Interrogation Session: 20241231020013
Implantable Lead Connection Status: 753985
Implantable Lead Connection Status: 753985
Implantable Lead Implant Date: 20171002
Implantable Lead Implant Date: 20171002
Implantable Lead Location: 753859
Implantable Lead Location: 753860
Implantable Pulse Generator Implant Date: 20171002
Lead Channel Impedance Value: 410 Ohm
Lead Channel Impedance Value: 580 Ohm
Lead Channel Pacing Threshold Amplitude: 0.5 V
Lead Channel Pacing Threshold Amplitude: 0.5 V
Lead Channel Pacing Threshold Pulse Width: 0.5 ms
Lead Channel Pacing Threshold Pulse Width: 0.5 ms
Lead Channel Sensing Intrinsic Amplitude: 12 mV
Lead Channel Sensing Intrinsic Amplitude: 4 mV
Lead Channel Setting Pacing Amplitude: 1.5 V
Lead Channel Setting Pacing Amplitude: 2.5 V
Lead Channel Setting Pacing Pulse Width: 0.5 ms
Lead Channel Setting Sensing Sensitivity: 2 mV
Pulse Gen Model: 2272
Pulse Gen Serial Number: 7951215

## 2023-05-18 ENCOUNTER — Other Ambulatory Visit: Payer: Self-pay

## 2023-05-18 ENCOUNTER — Other Ambulatory Visit (HOSPITAL_BASED_OUTPATIENT_CLINIC_OR_DEPARTMENT_OTHER): Payer: Self-pay

## 2023-05-18 MED ORDER — APIXABAN 5 MG PO TABS
5.0000 mg | ORAL_TABLET | Freq: Two times a day (BID) | ORAL | 5 refills | Status: DC
  Filled 2023-05-18: qty 60, 30d supply, fill #0
  Filled 2023-06-16: qty 60, 30d supply, fill #1
  Filled 2023-07-14: qty 60, 30d supply, fill #2
  Filled 2023-08-13: qty 60, 30d supply, fill #3
  Filled 2023-09-15: qty 60, 30d supply, fill #4
  Filled 2023-10-08: qty 60, 30d supply, fill #5

## 2023-05-18 NOTE — Telephone Encounter (Signed)
 Prescription refill request for Eliquis received. Indication: Afib  Last office visit: 04/26/23 Graciela Husbands)  Scr: 1.1 (05/04/23)  Age: 89 Weight: 99.9kg  Appropriate dose. Refill sent.

## 2023-06-06 ENCOUNTER — Encounter: Payer: Self-pay | Admitting: Internal Medicine

## 2023-06-06 ENCOUNTER — Other Ambulatory Visit (HOSPITAL_BASED_OUTPATIENT_CLINIC_OR_DEPARTMENT_OTHER): Payer: Self-pay

## 2023-06-06 DIAGNOSIS — J849 Interstitial pulmonary disease, unspecified: Secondary | ICD-10-CM

## 2023-06-07 ENCOUNTER — Encounter (HOSPITAL_BASED_OUTPATIENT_CLINIC_OR_DEPARTMENT_OTHER): Payer: PPO

## 2023-06-07 ENCOUNTER — Encounter: Payer: Self-pay | Admitting: Pulmonary Disease

## 2023-06-07 ENCOUNTER — Encounter (HOSPITAL_BASED_OUTPATIENT_CLINIC_OR_DEPARTMENT_OTHER): Payer: Self-pay

## 2023-06-07 ENCOUNTER — Ambulatory Visit: Payer: PPO | Admitting: Pulmonary Disease

## 2023-06-08 ENCOUNTER — Encounter: Payer: Self-pay | Admitting: Internal Medicine

## 2023-06-08 ENCOUNTER — Ambulatory Visit: Payer: PPO | Admitting: Internal Medicine

## 2023-06-08 VITALS — BP 120/70 | HR 54 | Ht 69.0 in | Wt 221.0 lb

## 2023-06-08 DIAGNOSIS — Z85038 Personal history of other malignant neoplasm of large intestine: Secondary | ICD-10-CM | POA: Diagnosis not present

## 2023-06-08 DIAGNOSIS — R197 Diarrhea, unspecified: Secondary | ICD-10-CM

## 2023-06-08 DIAGNOSIS — K222 Esophageal obstruction: Secondary | ICD-10-CM | POA: Diagnosis not present

## 2023-06-08 DIAGNOSIS — R1310 Dysphagia, unspecified: Secondary | ICD-10-CM

## 2023-06-08 MED ORDER — ZENPEP 60000-189600 UNITS PO CPEP: 1.0000 | ORAL_CAPSULE | Freq: Three times a day (TID) | ORAL | Status: DC

## 2023-06-08 NOTE — Patient Instructions (Addendum)
Continue colestipol at current dose.  Consider dairy free trial.   Start Zenpep samples taking 1 capsule with meals. Possibly 2 capsules with meals if a large meal.   MyChart message our office at the end of next week with an update.   _______________________________________________________  If your blood pressure at your visit was 140/90 or greater, please contact your primary care physician to follow up on this.  _______________________________________________________  If you are age 29 or older, your body mass index should be between 23-30. Your Body mass index is 32.64 kg/m. If this is out of the aforementioned range listed, please consider follow up with your Primary Care Provider.  If you are age 73 or younger, your body mass index should be between 19-25. Your Body mass index is 32.64 kg/m. If this is out of the aformentioned range listed, please consider follow up with your Primary Care Provider.   ________________________________________________________  The [facility] providers would like to encourage you to use [facility] to communicate with providers for non-urgent requests or questions.  Due to long hold times on the telephone, sending your provider a message by [facility] may be a faster and more efficient way to get a response.  Please allow 48 business hours for a response.  Please remember that this is for non-urgent requests.  _______________________________________________________

## 2023-06-08 NOTE — Progress Notes (Signed)
Subjective:    Patient ID: [name], male    DOB: 07/04/1932, 89 y.o.   MRN: [id number]  HPI Donald [name] is an 89 year old male with a history of right-sided colon cancer status post resection with intra-abdominal metastasis following with Dr. Truett Perna, iron deficiency anemia, history of gastric angioectasia, history of Schatzki's ring, hiatal hernia, colonic diverticulosis, IBS, possible SIBO who is here for follow-up.  I last saw him about a year ago for surveillance colonoscopy.  He has recently had PET scan which showed hypermetabolic soft tissue mass in the small bowel mesentery concerning for metastatic focus as well as right sided retroperitoneal inferior to the right kidney and lateral to the right psoas metastasis.  Colonoscopy 06/06/2022 revealed 7 mm descending polyp which was removed and found to be a traditional serrated adenoma.  There was healthy appearing and decide ileocolonic anastomosis in the ascending colon and diverticulosis in the left colon.  After his last visit due to intermittent loose stools we started him on colestipol but this pill was too large and he was later switched to colestipol granules 5 g which he has used most nights.  The patient, with a history of colon cancer and subsequent colectomy, presents with ongoing bowel irregularities. Despite taking cholestipol granules, he reports inconsistent bowel movements, often urgent and unformed, likened to 'oatmeal.' He also reports occasional mucus in the stool. The bowel irregularities are not associated with pain, but he describes significant urgency and frequency, particularly on 'bad days.'  He also reports difficulty swallowing large pills, which may be related to a known hiatal hernia and a Schatzky's ring identified in a previous upper endoscopy in 2022. He denies any current pain in the area of the hernia.  His diet includes daily yogurt and lactose-free milk, but he has been trying to reduce dairy intake on  the advice of a friend. He resides at [facility]  and maintains a regular meal schedule.  Review of Systems As per HPI, otherwise negative  Current Medications, Allergies, Past Medical History, Past Surgical History, Family History and Social History were reviewed in [employer] record.    Objective:   Physical Exam BP 120/70   Pulse (!) 54   Ht 5\' 9"  (1.753 m)   Wt 221 lb (100.2 kg)   SpO2 95%   BMI 32.64 kg/m  Gen: awake, alert, NAD HEENT: anicteric  Abd: soft, NT/ND, +BS throughout Ext: no c/c/e Neuro: nonfocal  ADDENDUM REPORT: 04/12/2023 08:23   ADDENDUM: Upon further review in the right-side of the retroperitoneum inferior to the right kidney and lateral to the right psoas muscle (axial image 149 of series 4) there is a 1.5 x 1.1 cm soft tissue attenuation nodule which is hypermetabolic (SUVmax = 6.2), presumably an additional metastatic implant.     Electronically Signed   By: Trudie Reed M.D.   On: 04/12/2023 08:23    NUCLEAR MEDICINE PET SKULL BASE TO THIGH   TECHNIQUE: 11.0 mCi F-18 FDG was injected intravenously. Full-ring PET imaging was performed from the skull base to thigh after the radiotracer. CT data was obtained and used for attenuation correction and anatomic localization.   Fasting blood glucose: 99 mg/dl   COMPARISON:  CT of the abdomen and pelvis 03/14/2023. CT of the chest, abdomen and pelvis 02/14/2022.   FINDINGS: Mediastinal blood pool activity: SUV max 3.0   Liver activity: SUV max 3.6   NECK: No hypermetabolic lymph nodes in the neck.   Incidental CT findings:  None.   CHEST: Mild focal hypermetabolism in the distal esophagus (SUVmax = 4.4) without readily apparent soft tissue mass on the CT portion of the examination. No hypermetabolic mediastinal or hilar nodes. No suspicious pulmonary nodules on the CT scan.   Incidental CT findings: Chronic interstitial lung changes, similar to prior studies  (better depicted on contemporaneously obtained high-resolution chest CT). Mild emphysema. Atherosclerotic calcifications in the thoracic aorta as well as the coronary arteries. Calcifications of the aortic valve and mitral annulus.   ABDOMEN/PELVIS: Correlate defined soft tissue mass in the small bowel mesentery slightly to the right of midline (axial image 133 of series 4) measuring 5.0 x 4.5 cm demonstrating heterogeneous areas of hypermetabolism (SUVmax = 8.5), compatible with a metastatic lesion (either a metastatic peritoneal deposit or metastatic lymphadenopathy). No abnormal hypermetabolic activity within the liver, pancreas, adrenal glands, or spleen.   Incidental CT findings: Status post partial right hemicolectomy. Aortic atherosclerosis. Low-attenuation lesions in the liver and kidneys which demonstrate no hypermetabolism, incompletely characterized on today's noncontrast CT examination, but statistically likely cysts (no imaging follow-up recommended). Atherosclerotic calcifications in the abdominal aorta and pelvic vasculature. Colonic diverticulosis without evidence of acute diverticulitis at this time.   SKELETON: No focal hypermetabolic activity to suggest skeletal metastasis.   Incidental CT findings: None.   IMPRESSION: 1. Poorly defined soft tissue mass in the small bowel mesentery concerning for metastasis in this patient with history of colon cancer. Whether this represents a intraperitoneal metastatic deposit or metastatic lymphadenopathy is uncertain. 2. No other distal sites of metastatic disease noted elsewhere in the neck, chest, abdomen or pelvis. 3. Mild focal hypermetabolism in the distal esophagus. No discrete mass identified on the CT portion of the examination. This is nonspecific, and could be physiologic, but correlation with follow-up nonemergent endoscopy should be considered if clinically appropriate. 4. Aortic atherosclerosis, in addition  to left main and three-vessel coronary artery disease. 5. There are calcifications of the aortic valve and mitral annulus. Echocardiographic correlation for evaluation of potential valvular dysfunction may be warranted if clinically indicated. 6. Colonic diverticulosis.   Electronically Signed: By: Trudie Reed M.D. On: 04/11/2023 07:46    Assessment & Plan:  89 year old male with a history of right-sided colon cancer status post resection with intra-abdominal metastasis following with Dr. Truett Perna, iron deficiency anemia, history of gastric angioectasia, history of Schatzki's ring, hiatal hernia, colonic diverticulosis, IBS, possible SIBO who is here for follow-up.  Postoperative changes after ileocecectomy/urgent loose stools Loose, unformed stools with urgency, sometimes multiple times a day. No pain reported. Currently on colestipol granules 5g at night which provides some relief but not consistently. -Continue colestipol granules 5g at night. -Trial of Zenpep (pancreatic enzymes) 60,000 units with meals for one week to aid digestion and absorption.  I asked that he send me a MyChart message next week to see if this is helpful. -Consider a 3-5 day dairy-free trial to assess for potential dairy intolerance. -MyChart message in one week to assess response to Zenpep. -If not helpful we could consider scheduled Lomotil once in the morning  History of Schatzki's ring History of Schatzky's ring and hiatal hernia. Difficulty swallowing large pills reported. No current dysphagia with food. -If swallowing becomes more difficult, consider endoscopic evaluation and possible dilation.  Colon cancer with metastasis Following closely with Dr. Truett Perna.  Observation at present.  Plan to repeat cross-sectional imaging with PET scan in March or April. -He is up-to-date with surveillance colonoscopy -Next interval assuming no other concerning finding by  imaging would be January 2027 based on  overall health at that time  30 minutes total spent today including patient facing time, coordination of care, reviewing medical history/procedures/pertinent radiology studies, and documentation of the encounter.

## 2023-06-12 DIAGNOSIS — Z961 Presence of intraocular lens: Secondary | ICD-10-CM | POA: Diagnosis not present

## 2023-06-12 DIAGNOSIS — H33302 Unspecified retinal break, left eye: Secondary | ICD-10-CM | POA: Diagnosis not present

## 2023-06-13 ENCOUNTER — Encounter (HOSPITAL_BASED_OUTPATIENT_CLINIC_OR_DEPARTMENT_OTHER): Payer: Self-pay | Admitting: Pulmonary Disease

## 2023-06-13 ENCOUNTER — Ambulatory Visit (HOSPITAL_BASED_OUTPATIENT_CLINIC_OR_DEPARTMENT_OTHER): Payer: PPO | Admitting: Pulmonary Disease

## 2023-06-13 VITALS — BP 104/72 | HR 60 | Resp 16 | Ht 69.0 in | Wt 219.7 lb

## 2023-06-13 DIAGNOSIS — G4733 Obstructive sleep apnea (adult) (pediatric): Secondary | ICD-10-CM

## 2023-06-13 DIAGNOSIS — I4891 Unspecified atrial fibrillation: Secondary | ICD-10-CM | POA: Diagnosis not present

## 2023-06-13 DIAGNOSIS — J849 Interstitial pulmonary disease, unspecified: Secondary | ICD-10-CM | POA: Diagnosis not present

## 2023-06-13 NOTE — Addendum Note (Signed)
Addended by: Jama Flavors on: 06/13/2023 10:42 AM   Modules accepted: Orders

## 2023-06-13 NOTE — Progress Notes (Signed)
Subjective:    Patient ID: [name], male    DOB: 11-13-1932, 89 y.o.   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 Uses cloth pads and sometimes they move and wake him up, no real problems. Cancer center found some spots-going to re evaluate in 6 months   Discussed the use of AI scribe software for clinical note transcription with the patient, who gave verbal consent to proceed.  History of Present Illness   [name], a patient with a history of sleep apnea, colon cancer, and interstitial lung disease (ILD), presents for a routine follow-up. The patient's sleep apnea is well-managed with a CPAP machine set at 13 cm of water pressure. The patient reports occasional issues with the CPAP mask slipping due to movement during sleep but has devised a system to prevent the machine from falling off the bed. The patient reports feeling rested during the day.  The patient's colon cancer was thought to be in remission for about 18 months. However, a recent scan on the two-year anniversary of the patient's last treatment showed spots in the abdomen. The patient reports no pain or discomfort related to these findings and is awaiting another scan in March to further evaluate these spots.  The patient's ILD appears to be stable, with no significant changes noted on recent scans. The patient reports no significant shortness of breath, except during episodes of atrial  fibrillation. The patient also notes a decrease in exercise tolerance but attributes this to age.  The patient lives in an independent living facility with his wife, who is in generally good health but is beginning to experience some mild cognitive loss. The patient remains active, swimming indoors during the winter months and plans to walk more once the weather improves.        Significant tests/ events reviewed  CT chest in 04/2013 showed mild emphysema PSG 04/2014 - 206 lbs-Moderate OSA-stopped breathing 24 times an hour.- corrected by C Pap 13 cm, large full facemask   CT high-resolution 02/16/2021-emphysema, stable interstitial lung disease and indeterminate pattern CT abdomen pelvis 02/14/2022-mild pulmonary fibrosis in indeterminate pattern.  Appears unchanged  03/2023 HRCT >> mild NSIP, unchanged   PFTs: 04/18/2018 FVC 3.43 [96%), FEV1 2.63 [105%], F/F 77, TLC 6.37 [95%], DLCO 17.94 [60%]     04/22/2022 FVC 3.19 (94%)  FEV1 2.44 (105%), F/F76, TLC 5.49 [82%], DLCO 15.61 [70%] Mild diffusion defect  Review of Systems neg for any significant sore throat, dysphagia, itching, sneezing, nasal congestion or excess/ purulent secretions, fever, chills, sweats, unintended wt loss, pleuritic or exertional cp, hempoptysis, orthopnea pnd or change in chronic leg swelling. Also denies presyncope, palpitations, heartburn, abdominal pain, nausea, vomiting, diarrhea or change in bowel or urinary habits, dysuria,hematuria, rash, arthralgias, visual complaints, headache, numbness weakness or ataxia.     Objective:   Physical Exam  Gen. Pleasant, obese, in no distress ENT -  no lesions, no post nasal drip Neck: No JVD, no thyromegaly, no carotid bruits Lungs: no use of accessory muscles, no dullness to percussion, bibasal dry rales no rhonchi  Cardiovascular: Rhythm regular, heart sounds  normal, no murmurs or gallops, no peripheral edema Musculoskeletal: No deformities, no cyanosis or clubbing  , no tremors        Assessment & Plan:        Interstitial Lung Disease (ILD) - Mild NSIP Mild NSIP, well-managed with no significant changes on recent CT scans. No significant dyspnea reported. Patient prefers to continue current management and follow-up locally for convenience. - Perform breath test tomorrow - Follow-up visit with pulmonologist  Colon Cancer with Possible Metastasis Colon cancer with recent PET scan showing a soft tissue mass in the small bowel mesentery concerning for metastasis. No current pain or discomfort. Follow-up scan planned for March to determine further management. Patient values quality of life and prefers minimal intervention unless necessary. - Schedule follow-up scan in March - Monitor for any new symptoms or changes  Atrial Fibrillation Intermittent episodes of atrial fibrillation causing occasional dyspnea. No prolonged episodes reported. Patient prefers to continue current management. - Continue current management - Monitor for any changes in symptoms  OSA -managed with CPAP at 13 cm H2O. Patient reports good results with occasional issues with the pads slipping. Prefers to continue current management without additional interventions. we discussed inspire but not  - Continue CPAP therapy at 13 cm H2O - Address any issues with CPAP pads as needed  General Health Maintenance Up to date with vaccinations and engages in swimming for exercise. - Encourage continued physical activity - Maintain up-to-date vaccinations  Follow-up - Schedule follow-up visit in one year - Contact if any new symptoms or issues arise.

## 2023-06-13 NOTE — Patient Instructions (Signed)
CPAP supplies will be renewed for a year.  CT scan shows stable lung disease

## 2023-06-14 ENCOUNTER — Encounter: Payer: Self-pay | Admitting: Internal Medicine

## 2023-06-15 ENCOUNTER — Ambulatory Visit (HOSPITAL_BASED_OUTPATIENT_CLINIC_OR_DEPARTMENT_OTHER): Payer: PPO | Admitting: Pulmonary Disease

## 2023-06-15 DIAGNOSIS — J849 Interstitial pulmonary disease, unspecified: Secondary | ICD-10-CM

## 2023-06-15 DIAGNOSIS — G4733 Obstructive sleep apnea (adult) (pediatric): Secondary | ICD-10-CM | POA: Diagnosis not present

## 2023-06-15 LAB — PULMONARY FUNCTION TEST
DL/VA % pred: 96 %
DL/VA: 3.69 ml/min/mmHg/L
DLCO cor % pred: 87 %
DLCO cor: 18.25 ml/min/mmHg
DLCO unc % pred: 87 %
DLCO unc: 18.25 ml/min/mmHg
FEF 25-75 Post: 2.09 L/s
FEF 25-75 Pre: 1.54 L/s
FEF2575-%Change-Post: 35 %
FEF2575-%Pred-Post: 166 %
FEF2575-%Pred-Pre: 122 %
FEV1-%Change-Post: 6 %
FEV1-%Pred-Post: 118 %
FEV1-%Pred-Pre: 111 %
FEV1-Post: 2.49 L
FEV1-Pre: 2.35 L
FEV1FVC-%Change-Post: 3 %
FEV1FVC-%Pred-Pre: 103 %
FEV6-%Change-Post: 3 %
FEV6-%Pred-Post: 116 %
FEV6-%Pred-Pre: 113 %
FEV6-Post: 3.31 L
FEV6-Pre: 3.2 L
FEV6FVC-%Change-Post: 0 %
FEV6FVC-%Pred-Post: 108 %
FEV6FVC-%Pred-Pre: 107 %
FVC-%Change-Post: 2 %
FVC-%Pred-Post: 107 %
FVC-%Pred-Pre: 104 %
FVC-Post: 3.33 L
FVC-Pre: 3.25 L
Post FEV1/FVC ratio: 75 %
Post FEV6/FVC ratio: 99 %
Pre FEV1/FVC ratio: 72 %
Pre FEV6/FVC Ratio: 99 %
RV % pred: 106 %
RV: 2.79 L
TLC % pred: 95 %
TLC: 6.1 L

## 2023-06-15 NOTE — Progress Notes (Signed)
Full PFT Performed Today

## 2023-06-15 NOTE — Patient Instructions (Signed)
Full PFT Performed Today

## 2023-06-16 ENCOUNTER — Other Ambulatory Visit (HOSPITAL_BASED_OUTPATIENT_CLINIC_OR_DEPARTMENT_OTHER): Payer: Self-pay

## 2023-06-16 ENCOUNTER — Encounter: Payer: Self-pay | Admitting: Internal Medicine

## 2023-06-19 ENCOUNTER — Ambulatory Visit: Payer: PPO | Admitting: Pulmonary Disease

## 2023-06-19 ENCOUNTER — Encounter: Payer: Self-pay | Admitting: Pulmonary Disease

## 2023-06-19 VITALS — BP 125/79 | HR 60 | Ht 66.0 in | Wt 223.4 lb

## 2023-06-19 DIAGNOSIS — J849 Interstitial pulmonary disease, unspecified: Secondary | ICD-10-CM | POA: Diagnosis not present

## 2023-06-19 DIAGNOSIS — G4733 Obstructive sleep apnea (adult) (pediatric): Secondary | ICD-10-CM | POA: Diagnosis not present

## 2023-06-19 NOTE — Telephone Encounter (Signed)
Given positive response would prescribe Zenpep 40K units 2 with meals, 1 with snacks  May need patient assistance paperwork, we can see if covered Thanks JMP

## 2023-06-19 NOTE — Progress Notes (Signed)
Donald [name]    [id number]    [birth year]  Primary Care Physician:Gupta, Freddie Breech, MD  Referring Physician: Mahlon Gammon, MD [address] [place],  [place] [address]  Chief complaint: Follow-up for pulmonary fibrosis  HPI: 89 y.o.  with recurrent atrial fibrillation, OSA, ex-smoker.  Evaluated in the [facility] clinic on 05/24/2022 after high-resolution CT scan that showed pulmonary fibrosis.  At that point he was being considered for amiodarone for atrial fibrillation.  Seen by Dr. Marchelle Gearing and recommended against amiodarone.  He is currently on Tikosyn and follows with Dr. Graciela Husbands, EP  Had right colectomy on November 2022 for a diagnosis of stage III colon cancer.  And completed chemotherapy under the care of Dr. Truett Perna.  He does note some worsening dyspnea while on chemotherapy but is recovering now.  Pets: No pets Occupation: Worked as a Comptroller Exposures: Had a down pillow and comforters which he got rid of last month.  No ongoing exposure.  No mold, hot tub, Jacuzzi, asbestos, humidifier Smoking history: 50-pack-year smoker.  Quit in 2000 Travel history: From [place].  No significant Relevant family history: Brother died of pulmonary fibrosis.  Interim history: Discussed the use of AI scribe software for clinical note transcription with the patient, who gave verbal consent to proceed.  The patient, with interstitial lung disease, presents for follow-up.  He has interstitial lung disease with no changes in breathing since the last visit. Recent lung function tests show stability with some improvement. He maintains a steady weight of 220 pounds and exercises five days a week in the pool, performing water exercises.  He has sleep apnea and uses a CPAP machine, reporting good results with this treatment.  He has a history of colon cancer, for which he underwent a colectomy a couple of years ago. There is a concern about recurrence, but further evaluation  is pending with another scan in a few months.  He maintains regular exercise, swimming five days a week, and has stable weight management.   Outpatient Encounter Medications as of 06/19/2023  Medication Sig   acetaminophen (TYLENOL) 500 MG tablet Take 500 mg by mouth as needed for moderate pain or headache.   albuterol (VENTOLIN HFA) 108 (90 Base) MCG/ACT inhaler INHALE TWO PUFFS INTO THE LUNGS EVERY 6 HOURS AS NEEDED FOR WHEEZEING OR SHORTNESS OF BREATH   apixaban (ELIQUIS) 5 MG TABS tablet Take 1 tablet (5 mg total) by mouth 2 (two) times daily.   carboxymethylcellulose (REFRESH PLUS) 0.5 % SOLN Place 1 drop into both eyes daily as needed (dry eyes).   colestipol (COLESTID) 5 g granules Take 5 g by mouth daily.   diltiazem (CARDIZEM CD) 120 MG 24 hr capsule Take 1 capsule (120 mg total) by mouth 2 (two) times daily, at breakfast and at bedtime.   dofetilide (TIKOSYN) 500 MCG capsule Take 1 capsule (500 mcg total) by mouth 2 (two) times daily.   furosemide (LASIX) 20 MG tablet Take 1 tablet (20 mg total) by mouth 2 (two) times daily.   magnesium oxide (MAG-OX) 400 MG tablet Take 1 tablet (400 mg total) by mouth at bedtime.   metoprolol tartrate (LOPRESSOR) 25 MG tablet Take 1 tablet (25 mg total) by mouth 2 (two) times daily.   Pancrelipase, Lip-Prot-Amyl, (ZENPEP) 65784-696295 units CPEP Take 1 capsule by mouth 3 (three) times daily with meals.   Pedialyte (PEDIALYTE) SOLN Take 178 mLs by mouth daily. 6 oz   rosuvastatin (  CRESTOR) 10 MG tablet Take 1 tablet (10 mg total) by mouth daily.   sildenafil (REVATIO) 20 MG tablet Take 20 mg by mouth daily as needed (erectile dysfunction).   sodium chloride (OCEAN) 0.65 % SOLN nasal spray Place 1 spray into both nostrils as needed for congestion.   No facility-administered encounter medications on file as of 06/19/2023.    Allergies as of 06/19/2023   (No Known Allergies)    Physical Exam: Blood pressure 125/79, pulse 60, height 5\' 6"  (1.676 m),  weight 223 lb 6.4 oz (101.3 kg), SpO2 94%. Gen:      No acute distress HEENT:  EOMI, sclera anicteric Neck:     No masses; no thyromegaly Lungs:    Clear to auscultation bilaterally; normal respiratory effort CV:         Regular rate and rhythm; no murmurs Abd:      + bowel sounds; soft, non-tender; no palpable masses, no distension Ext:    No edema; adequate peripheral perfusion Skin:      Warm and dry; no rash Neuro: alert and oriented x 3 Psych: normal mood and affect   Data Reviewed: Imaging: CT chest 05/07/2013- mild emphysematous changes, compressive atelectasis in the right lower lobe.   CT high-resolution 04/23/2018- bronchial wall thickening with mild emphysema.  Patchy areas of peripheral septal thickening, mild groundglass.  Indeterminate for UIP. CT high-resolution 05/06/2020- mild emphysema, stable mild interstitial lung disease, indeterminate for UIP. CT high-resolution 02/16/2021-emphysema, stable interstitial lung disease and indeterminate pattern CT abdomen pelvis 02/14/2022-mild pulmonary fibrosis in indeterminate pattern.  Appears unchanged CT high-resolution 04/06/2023-stable pattern of mild pulmonary fibrosis and indeterminate pattern, mild emphysema. I have reviewed the images personally.  PFTs: 04/18/2018 FVC 3.43 [96%), FEV1 2.63 [105%], F/F 77, TLC 6.37 [95%], DLCO 17.94 [60%]  05/18/2020 FVC 3.39 [97%], FEV1 2.60 [108%], F/F 77, TLC 5.54 [83%], DLCO 16.87 [75%]  04/22/2022 FVC 3.19 (94%)  FEV1 2.44 (105%), F/F76, TLC 5.49 [82%], DLCO 15.61 [70%] Mild diffusion defect  06/15/2023 FVC 3.33 [107%], FEV1 2.49 [118%], F/F75, TLC 6.10 [95%] DLCO 18.25 [87%] Normal test   Labs: Hypersensitivity panel 05/24/2018-negative CTD serologies-ANA negative, CCP less than 16, double-stranded DNA, Ro, La, SCL 70- Rheumatoid factor 14  Aldolase 5.3 CK 3 1 ANCA-negative  Myositis panel 07/12/2018-negative  Assessment: . Interstitial Lung Disease Unclear etiology.  The  fibrosis is indeterminate for UIP on CT scan and appears to be new compared to 2014.  Possibilities include hypersensitivity pneumonitis given his exposure to down pillow and comforter or IPF as he has a family history of pulmonary fibrosis.  He has gotten rid of his down pillows and comforters in 2020  ILD serologies reviewed with borderline rheumatoid factor which is nonspecific.    Discussed further work-up including lung biopsy.  He is opposed to surgical lung biopsy or bronchoscope due to risks involved.  I agree that at his age we can continue with conservative management and monitoring especially as his CT scan shows no progression of fibrosis.  Recent pulmonary function tests shows normal function with some improvement likely due to better test effort and consistent exercise. The condition is well-managed, and no invasive treatments are currently necessary. Care will be transferred to Dr. Vassie Loll for convenience as he lives closer to [place] office.  - Continue monitoring lung function - Transfer care to Dr. Vassie Loll for continued management  Sleep Apnea Sleep apnea managed effectively with CPAP therapy. Dr. Vassie Loll, a sleep specialist, confirmed satisfactory management. Discussed the benefits of continued  CPAP therapy and regular follow-ups. - Continue CPAP therapy - Follow up with Dr. Vassie Loll for sleep apnea management  Colon Cancer Colon cancer status post-colectomy. Disease-free for a couple of years, but potential recurrence indicated. Awaiting further scans in a couple of months to determine treatment. Emphasized the importance of follow-up scans. - Follow up with Dr. Truett Perna for further scans and treatment planning  Follow-up - Schedule follow-up with Dr. Vassie Loll in January 2026.   Plan/Recommendations: - Continue CPAP - Follow-up with Dr. Vassie Loll at [facility] office.  Chilton Greathouse MD [facility] 06/19/2023, 9:54 AM  CC: Mahlon Gammon, MD

## 2023-06-19 NOTE — Patient Instructions (Addendum)
VISIT SUMMARY:  You came in today for a follow-up visit regarding your interstitial lung disease, sleep apnea, and history of colon cancer. Your lung function tests show stability with some improvement, and you are maintaining a steady weight with regular exercise. Your sleep apnea is well-managed with CPAP therapy, and you are awaiting further evaluation for potential recurrence of colon cancer.  YOUR PLAN:  -INTERSTITIAL LUNG DISEASE: Interstitial lung disease is a condition characterized by scarring of the lung tissue. Your recent lung function tests show minimal change with some improvement, likely due to better test effort and consistent exercise. The condition is well-managed, and no invasive treatments are currently necessary. Your care will be transferred to Dr. Vassie Loll for continued management. Please continue monitoring your lung function.  -SLEEP APNEA: Sleep apnea is a condition where breathing repeatedly stops and starts during sleep. Your sleep apnea is being effectively managed with CPAP therapy, and Dr. Vassie Loll has confirmed satisfactory management. It is important to continue using your CPAP machine and follow up with Dr. Vassie Loll for ongoing management.  -COLON CANCER: You have a history of colon cancer and underwent a colectomy a couple of years ago. While you have been disease-free, there is a concern about potential recurrence. You will need to follow up with Dr. Truett Perna for further scans in a few months to determine the next steps in your treatment. It is crucial to attend these follow-up scans.  INSTRUCTIONS:  Please schedule a follow-up appointment with Dr. Vassie Loll in January 2026.

## 2023-06-20 ENCOUNTER — Other Ambulatory Visit (HOSPITAL_BASED_OUTPATIENT_CLINIC_OR_DEPARTMENT_OTHER): Payer: Self-pay

## 2023-06-20 ENCOUNTER — Other Ambulatory Visit: Payer: Self-pay

## 2023-06-20 MED ORDER — ZENPEP 40000-126000 UNITS PO CPEP
ORAL_CAPSULE | ORAL | 3 refills | Status: DC
  Filled 2023-06-20: qty 600, 85d supply, fill #0

## 2023-06-21 ENCOUNTER — Other Ambulatory Visit (HOSPITAL_BASED_OUTPATIENT_CLINIC_OR_DEPARTMENT_OTHER): Payer: Self-pay

## 2023-06-22 DIAGNOSIS — H43813 Vitreous degeneration, bilateral: Secondary | ICD-10-CM | POA: Diagnosis not present

## 2023-06-22 DIAGNOSIS — H33311 Horseshoe tear of retina without detachment, right eye: Secondary | ICD-10-CM | POA: Diagnosis not present

## 2023-06-22 DIAGNOSIS — Z961 Presence of intraocular lens: Secondary | ICD-10-CM | POA: Diagnosis not present

## 2023-06-23 ENCOUNTER — Other Ambulatory Visit (HOSPITAL_BASED_OUTPATIENT_CLINIC_OR_DEPARTMENT_OTHER): Payer: Self-pay

## 2023-06-23 NOTE — Telephone Encounter (Signed)
 Moncell please see note from Dr. Bridgett Camps requesting a tier exception and/or possible pt assistance for the pt.

## 2023-06-23 NOTE — Telephone Encounter (Signed)
 Rock,  Can we ask for a tier exception for this product to help Donald [name] out for EPI?  Please do so if possible.  Perhaps patient assistance would be an option? He can also come for more samples while we work on this if he would like Please keep me informed JMP

## 2023-06-26 ENCOUNTER — Other Ambulatory Visit: Payer: Self-pay

## 2023-06-26 ENCOUNTER — Other Ambulatory Visit (HOSPITAL_BASED_OUTPATIENT_CLINIC_OR_DEPARTMENT_OTHER): Payer: Self-pay

## 2023-06-26 MED ORDER — ZENPEP 60000-189600 UNITS PO CPEP
2.0000 | ORAL_CAPSULE | Freq: Three times a day (TID) | ORAL | 2 refills | Status: DC
  Filled 2023-06-26: qty 300, 37d supply, fill #0
  Filled 2023-08-17: qty 300, 37d supply, fill #1
  Filled 2023-09-29: qty 300, 37d supply, fill #2

## 2023-06-27 NOTE — Progress Notes (Signed)
Remote pacemaker transmission.

## 2023-06-27 NOTE — Addendum Note (Signed)
Addended by: Geralyn Flash D on: 06/27/2023 03:37 PM   Modules accepted: Orders

## 2023-06-28 ENCOUNTER — Other Ambulatory Visit (HOSPITAL_BASED_OUTPATIENT_CLINIC_OR_DEPARTMENT_OTHER): Payer: Self-pay

## 2023-06-28 ENCOUNTER — Telehealth: Payer: Self-pay | Admitting: Oncology

## 2023-06-28 NOTE — Telephone Encounter (Signed)
Spoke with patient confirming upcoming appointment

## 2023-07-04 ENCOUNTER — Other Ambulatory Visit: Payer: PPO

## 2023-07-11 DIAGNOSIS — G4733 Obstructive sleep apnea (adult) (pediatric): Secondary | ICD-10-CM | POA: Diagnosis not present

## 2023-07-14 ENCOUNTER — Other Ambulatory Visit: Payer: Self-pay

## 2023-07-14 ENCOUNTER — Other Ambulatory Visit (HOSPITAL_BASED_OUTPATIENT_CLINIC_OR_DEPARTMENT_OTHER): Payer: Self-pay

## 2023-07-17 ENCOUNTER — Other Ambulatory Visit (HOSPITAL_BASED_OUTPATIENT_CLINIC_OR_DEPARTMENT_OTHER): Payer: Self-pay

## 2023-07-17 ENCOUNTER — Other Ambulatory Visit: Payer: Self-pay | Admitting: *Deleted

## 2023-07-17 ENCOUNTER — Other Ambulatory Visit: Payer: Self-pay

## 2023-07-17 MED ORDER — MAGNESIUM OXIDE 400 MG PO TABS
400.0000 mg | ORAL_TABLET | Freq: Every day | ORAL | 1 refills | Status: DC
  Filled 2023-07-17: qty 120, 120d supply, fill #0
  Filled 2023-11-06: qty 120, 120d supply, fill #1

## 2023-07-17 NOTE — Telephone Encounter (Signed)
 Patient requested refill.  Pended Rx and sent to [facility] for approval.

## 2023-07-28 DIAGNOSIS — L821 Other seborrheic keratosis: Secondary | ICD-10-CM | POA: Diagnosis not present

## 2023-07-28 DIAGNOSIS — D1801 Hemangioma of skin and subcutaneous tissue: Secondary | ICD-10-CM | POA: Diagnosis not present

## 2023-07-28 DIAGNOSIS — L304 Erythema intertrigo: Secondary | ICD-10-CM | POA: Diagnosis not present

## 2023-07-28 DIAGNOSIS — L218 Other seborrheic dermatitis: Secondary | ICD-10-CM | POA: Diagnosis not present

## 2023-07-28 DIAGNOSIS — D225 Melanocytic nevi of trunk: Secondary | ICD-10-CM | POA: Diagnosis not present

## 2023-07-28 DIAGNOSIS — D692 Other nonthrombocytopenic purpura: Secondary | ICD-10-CM | POA: Diagnosis not present

## 2023-07-28 DIAGNOSIS — Z85828 Personal history of other malignant neoplasm of skin: Secondary | ICD-10-CM | POA: Diagnosis not present

## 2023-08-01 ENCOUNTER — Ambulatory Visit (HOSPITAL_BASED_OUTPATIENT_CLINIC_OR_DEPARTMENT_OTHER)
Admission: RE | Admit: 2023-08-01 | Discharge: 2023-08-01 | Disposition: A | Discharge status: Home / Self Care | Payer: PPO | Source: Ambulatory Visit | Attending: Oncology | Admitting: Oncology

## 2023-08-01 ENCOUNTER — Other Ambulatory Visit: Payer: PPO

## 2023-08-01 ENCOUNTER — Inpatient Hospital Stay: Payer: Self-pay | Attending: Oncology

## 2023-08-01 DIAGNOSIS — C786 Secondary malignant neoplasm of retroperitoneum and peritoneum: Secondary | ICD-10-CM | POA: Diagnosis not present

## 2023-08-01 DIAGNOSIS — C182 Malignant neoplasm of ascending colon: Secondary | ICD-10-CM | POA: Insufficient documentation

## 2023-08-01 DIAGNOSIS — K669 Disorder of peritoneum, unspecified: Secondary | ICD-10-CM | POA: Insufficient documentation

## 2023-08-01 DIAGNOSIS — K5730 Diverticulosis of large intestine without perforation or abscess without bleeding: Secondary | ICD-10-CM | POA: Diagnosis not present

## 2023-08-01 DIAGNOSIS — N400 Enlarged prostate without lower urinary tract symptoms: Secondary | ICD-10-CM | POA: Diagnosis not present

## 2023-08-01 DIAGNOSIS — R1900 Intra-abdominal and pelvic swelling, mass and lump, unspecified site: Secondary | ICD-10-CM | POA: Insufficient documentation

## 2023-08-01 DIAGNOSIS — C180 Malignant neoplasm of cecum: Secondary | ICD-10-CM | POA: Insufficient documentation

## 2023-08-01 DIAGNOSIS — C189 Malignant neoplasm of colon, unspecified: Secondary | ICD-10-CM | POA: Diagnosis not present

## 2023-08-01 LAB — BASIC METABOLIC PANEL - CANCER CENTER ONLY
Anion gap: 5 (ref 5–15)
BUN: 23 mg/dL (ref 8–23)
CO2: 29 mmol/L (ref 22–32)
Calcium: 9.7 mg/dL (ref 8.9–10.3)
Chloride: 104 mmol/L (ref 98–111)
Creatinine: 1.11 mg/dL (ref 0.61–1.24)
GFR, Estimated: >60 mL/min (ref >60)
Glucose, Bld: 94 mg/dL (ref 70–99)
Potassium: 4.7 mmol/L (ref 3.5–5.1)
Sodium: 138 mmol/L (ref 135–145)

## 2023-08-01 MED ORDER — IOHEXOL 300 MG/ML  SOLN
100.0000 mL | Freq: Once | INTRAMUSCULAR | Status: AC | PRN
  Administered 2023-08-01: 100 mL via INTRAVENOUS

## 2023-08-07 ENCOUNTER — Telehealth: Payer: Self-pay | Admitting: Oncology

## 2023-08-07 ENCOUNTER — Other Ambulatory Visit (HOSPITAL_BASED_OUTPATIENT_CLINIC_OR_DEPARTMENT_OTHER): Payer: Self-pay

## 2023-08-07 ENCOUNTER — Inpatient Hospital Stay (HOSPITAL_BASED_OUTPATIENT_CLINIC_OR_DEPARTMENT_OTHER): Payer: PPO | Admitting: Oncology

## 2023-08-07 VITALS — BP 125/69 | HR 76 | Temp 98.1°F | Resp 18 | Ht 66.0 in | Wt 219.3 lb

## 2023-08-07 DIAGNOSIS — C180 Malignant neoplasm of cecum: Secondary | ICD-10-CM | POA: Diagnosis not present

## 2023-08-07 DIAGNOSIS — C182 Malignant neoplasm of ascending colon: Secondary | ICD-10-CM

## 2023-08-07 NOTE — Telephone Encounter (Signed)
 Spoke with patient confirming upcoming appointment

## 2023-08-07 NOTE — Progress Notes (Signed)
 [facility] OFFICE PROGRESS NOTE   Diagnosis: Colon cancer  INTERVAL HISTORY:   Mr. Wadlow returns as scheduled.  He feels well.  Good appetite.  No difficulty with bowel function.  No abdominal pain or nausea.  He reports chronic low back pain (20 years).  He has pain when getting in bed at night that resolves after lying down.  Diarrhea has improved since beginning pancreas enzyme replacement.  He reports having diarrhea prior to colon surgery.  Objective:  Vital signs in last 24 hours:  Blood pressure 125/69, pulse 76, temperature 98.1 F (36.7 C), resp. rate 18, height 5\' 6"  (1.676 m), weight 219 lb 4.8 oz (99.5 kg), SpO2 98%.    Lymphatics: No cervical, supraclavicular, axillary, or inguinal nodes Resp: End inspiratory rales at the right posterior base, no respiratory distress Cardio: Regular rate and rhythm GI: No hepatosplenomegaly, no mass, nontender Vascular: No leg edema  Lab Results:  Lab Results  Component Value Date   WBC 9.6 10/24/2022   HGB 14.9 10/24/2022   HCT 45.0 10/24/2022   MCV 95 10/24/2022   PLT 186 10/24/2022   NEUTROABS 5.8 09/20/2021    CMP  Lab Results  Component Value Date   NA 138 08/01/2023   K 4.7 08/01/2023   CL 104 08/01/2023   CO2 29 08/01/2023   GLUCOSE 94 08/01/2023   BUN 23 08/01/2023   CREATININE 1.11 08/01/2023   CALCIUM 9.7 08/01/2023   PROT 7.1 09/20/2021   ALBUMIN 4.3 05/04/2023   AST 26 05/04/2023   ALT 24 05/04/2023   ALKPHOS 78 05/04/2023   BILITOT 1.0 09/20/2021   GFRNONAA >60 08/01/2023   GFRAA >60 02/25/2019    Lab Results  Component Value Date   CEA 1.97 03/14/2023   Medications: I have reviewed the patient's current medications.   Assessment/Plan:  Colon cancer-stage III (T3N1), cecum mass biopsy 02/03/2021-adenocarcinoma CTs 02/16/2021-stable interstitial lung disease, mild COPD, cardiomegaly, CAD, multiple liver cysts with a few subcentimeter too small to characterize liver lesions,  circumferential wall thickening at the ileocecal valve, right lower quadrant ileocolic lymph node measures 1.1 cm 03/24/2021, robotic assisted laparoscopic right colectomy-T3, 1/8 lymph nodes, 1 tumor deposit, lymphovascular invasion, mismatch repair protein expression intact, MSS: Foundation 1-HRD signature negative, MSS, tumor mutation burden 1, K-ras G12V Cycle 1 adjuvant Xeloda 04/21/2021 Cycle 2 adjuvant Xeloda 05/12/2021 Cycle 3 adjuvant Xeloda 06/07/2021 (5-day delay in start date due to foot discomfort, travel plans) Cycle 4 adjuvant Xeloda 06/29/2021, Xeloda dose reduced to 1000 mg twice daily for 14 days Cycle 5 adjuvant Xeloda 07/19/2021 Cycle 6 adjuvant Xeloda 08/09/2021 Cycle 7 adjuvant Xeloda 08/30/2021 Cycle 8 adjuvant Xeloda 09/20/2021 CTs 02/14/2022-no evidence of recurrent disease CT abdomen/pelvis 03/14/2023- right central mesentery and right iliac fossa implants PET 02/01/2023: Hypermetabolic right small bowel mesenteric mass, mild focal hypermetabolism in the distal esophagus-nonspecific.  The right pelvic nodule is hypermetabolic. CT abdomen/pelvis 08/01/2023: Mild increase in size of right abdominal mesenteric and right lower quadrant peritoneal metastases, no new site of metastatic disease CAD Interstitial lung disease Iron deficiency anemia secondary to #1 Multiple colon polyps on the colonoscopy 02/03/2021-microscopic fragments of adenocarcinoma involving biopsies of transverse and ascending colon polyps felt to be contaminant from the cecum biopsy 2 tubular adenomas on the right colectomy specimen 03/24/2021 OSA Sick sinus syndrome-pacemaker Atrial fibrillation-maintained on apixaban Chronic diastolic CHF History of multiple skin cancers      Disposition: Mr. Millea has a history of stage III colon cancer.  He completed adjuvant  capecitabine in May 2023.  There are enlarging right mesenteric and retroperitoneal masses on CT.  He appears asymptomatic from the metastatic colon  cancer.  We discussed the high likelihood the abdominal/retroperitoneal masses are related to metastatic colon cancer as opposed to another etiology.  We discussed treatment options.  He does not appear to be a candidate for surgery or radiation.  I recommend systemic chemotherapy if he chooses to begin treatment.  We reviewed the general treatment schema associated with FOLFOX or FOLFIRI.  He understands no therapy will be curative.  The goals of treatment are to improve symptoms and prolong survival.  Mr. Neyhart is most comfortable with continued observation.  He will return for an office visit and repeat CTs in 4 months.  He will call for new symptoms.  I think it is unlikely the lower back pain is related to recurrent colon cancer.  He reports chronic pain that improves with position change.  I reviewed the CT images with Mr. Halbig and his wife.  Thornton Papas, MD  08/07/2023  11:58 AM

## 2023-08-10 DIAGNOSIS — G4733 Obstructive sleep apnea (adult) (pediatric): Secondary | ICD-10-CM | POA: Diagnosis not present

## 2023-08-14 ENCOUNTER — Other Ambulatory Visit (HOSPITAL_BASED_OUTPATIENT_CLINIC_OR_DEPARTMENT_OTHER): Payer: Self-pay

## 2023-08-15 ENCOUNTER — Ambulatory Visit (INDEPENDENT_AMBULATORY_CARE_PROVIDER_SITE_OTHER): Payer: PPO

## 2023-08-15 DIAGNOSIS — I495 Sick sinus syndrome: Secondary | ICD-10-CM | POA: Diagnosis not present

## 2023-08-15 LAB — CUP PACEART REMOTE DEVICE CHECK
Battery Remaining Longevity: 31 mo
Battery Remaining Percentage: 27 %
Battery Voltage: 2.95 V
Brady Statistic AP VP Percent: 1 %
Brady Statistic AP VS Percent: 97 %
Brady Statistic AS VP Percent: 1 %
Brady Statistic AS VS Percent: 2.3 %
Brady Statistic RA Percent Paced: 96 %
Brady Statistic RV Percent Paced: 1.1 %
Date Time Interrogation Session: 20250401020015
Implantable Lead Connection Status: 753985
Implantable Lead Connection Status: 753985
Implantable Lead Implant Date: 20171002
Implantable Lead Implant Date: 20171002
Implantable Lead Location: 753859
Implantable Lead Location: 753860
Implantable Pulse Generator Implant Date: 20171002
Lead Channel Impedance Value: 390 Ohm
Lead Channel Impedance Value: 580 Ohm
Lead Channel Pacing Threshold Amplitude: 0.5 V
Lead Channel Pacing Threshold Amplitude: 0.5 V
Lead Channel Pacing Threshold Pulse Width: 0.5 ms
Lead Channel Pacing Threshold Pulse Width: 0.5 ms
Lead Channel Sensing Intrinsic Amplitude: 12 mV
Lead Channel Sensing Intrinsic Amplitude: 3.3 mV
Lead Channel Setting Pacing Amplitude: 1.5 V
Lead Channel Setting Pacing Amplitude: 2.5 V
Lead Channel Setting Pacing Pulse Width: 0.5 ms
Lead Channel Setting Sensing Sensitivity: 2 mV
Pulse Gen Model: 2272
Pulse Gen Serial Number: 7951215

## 2023-08-17 ENCOUNTER — Encounter: Payer: Self-pay | Admitting: Internal Medicine

## 2023-08-17 ENCOUNTER — Other Ambulatory Visit (HOSPITAL_BASED_OUTPATIENT_CLINIC_OR_DEPARTMENT_OTHER): Payer: Self-pay

## 2023-08-18 ENCOUNTER — Other Ambulatory Visit (HOSPITAL_BASED_OUTPATIENT_CLINIC_OR_DEPARTMENT_OTHER): Payer: Self-pay

## 2023-09-02 ENCOUNTER — Encounter: Payer: Self-pay | Admitting: Internal Medicine

## 2023-09-13 DIAGNOSIS — G4733 Obstructive sleep apnea (adult) (pediatric): Secondary | ICD-10-CM | POA: Diagnosis not present

## 2023-09-15 ENCOUNTER — Other Ambulatory Visit (HOSPITAL_BASED_OUTPATIENT_CLINIC_OR_DEPARTMENT_OTHER): Payer: Self-pay

## 2023-09-16 DIAGNOSIS — M5136 Other intervertebral disc degeneration, lumbar region with discogenic back pain only: Secondary | ICD-10-CM | POA: Diagnosis not present

## 2023-09-21 ENCOUNTER — Encounter: Payer: Self-pay | Admitting: Internal Medicine

## 2023-09-28 NOTE — Progress Notes (Signed)
 Remote pacemaker transmission.

## 2023-09-29 ENCOUNTER — Other Ambulatory Visit (HOSPITAL_BASED_OUTPATIENT_CLINIC_OR_DEPARTMENT_OTHER): Payer: Self-pay

## 2023-09-29 ENCOUNTER — Other Ambulatory Visit (HOSPITAL_COMMUNITY): Payer: Self-pay

## 2023-10-02 ENCOUNTER — Other Ambulatory Visit (HOSPITAL_BASED_OUTPATIENT_CLINIC_OR_DEPARTMENT_OTHER): Payer: Self-pay

## 2023-10-04 DIAGNOSIS — M545 Low back pain, unspecified: Secondary | ICD-10-CM | POA: Diagnosis not present

## 2023-10-04 DIAGNOSIS — M5136 Other intervertebral disc degeneration, lumbar region with discogenic back pain only: Secondary | ICD-10-CM | POA: Diagnosis not present

## 2023-10-08 ENCOUNTER — Other Ambulatory Visit: Payer: Self-pay

## 2023-10-10 DIAGNOSIS — M545 Low back pain, unspecified: Secondary | ICD-10-CM | POA: Diagnosis not present

## 2023-10-10 DIAGNOSIS — M5136 Other intervertebral disc degeneration, lumbar region with discogenic back pain only: Secondary | ICD-10-CM | POA: Diagnosis not present

## 2023-10-13 DIAGNOSIS — M545 Low back pain, unspecified: Secondary | ICD-10-CM | POA: Diagnosis not present

## 2023-10-13 DIAGNOSIS — M5136 Other intervertebral disc degeneration, lumbar region with discogenic back pain only: Secondary | ICD-10-CM | POA: Diagnosis not present

## 2023-10-16 DIAGNOSIS — M5136 Other intervertebral disc degeneration, lumbar region with discogenic back pain only: Secondary | ICD-10-CM | POA: Diagnosis not present

## 2023-10-16 DIAGNOSIS — M545 Low back pain, unspecified: Secondary | ICD-10-CM | POA: Diagnosis not present

## 2023-10-17 DIAGNOSIS — G4733 Obstructive sleep apnea (adult) (pediatric): Secondary | ICD-10-CM | POA: Diagnosis not present

## 2023-10-18 ENCOUNTER — Encounter: Payer: Self-pay | Admitting: Oncology

## 2023-10-19 DIAGNOSIS — M5136 Other intervertebral disc degeneration, lumbar region with discogenic back pain only: Secondary | ICD-10-CM | POA: Diagnosis not present

## 2023-10-19 DIAGNOSIS — M545 Low back pain, unspecified: Secondary | ICD-10-CM | POA: Diagnosis not present

## 2023-10-23 DIAGNOSIS — M545 Low back pain, unspecified: Secondary | ICD-10-CM | POA: Diagnosis not present

## 2023-10-23 DIAGNOSIS — M5136 Other intervertebral disc degeneration, lumbar region with discogenic back pain only: Secondary | ICD-10-CM | POA: Diagnosis not present

## 2023-10-26 DIAGNOSIS — M545 Low back pain, unspecified: Secondary | ICD-10-CM | POA: Diagnosis not present

## 2023-10-26 DIAGNOSIS — M5136 Other intervertebral disc degeneration, lumbar region with discogenic back pain only: Secondary | ICD-10-CM | POA: Diagnosis not present

## 2023-10-30 ENCOUNTER — Other Ambulatory Visit: Payer: Self-pay | Admitting: Internal Medicine

## 2023-10-30 DIAGNOSIS — L821 Other seborrheic keratosis: Secondary | ICD-10-CM | POA: Diagnosis not present

## 2023-10-30 DIAGNOSIS — L820 Inflamed seborrheic keratosis: Secondary | ICD-10-CM | POA: Diagnosis not present

## 2023-10-30 DIAGNOSIS — D225 Melanocytic nevi of trunk: Secondary | ICD-10-CM | POA: Diagnosis not present

## 2023-10-30 DIAGNOSIS — L918 Other hypertrophic disorders of the skin: Secondary | ICD-10-CM | POA: Diagnosis not present

## 2023-10-30 DIAGNOSIS — Z85828 Personal history of other malignant neoplasm of skin: Secondary | ICD-10-CM | POA: Diagnosis not present

## 2023-10-30 DIAGNOSIS — L570 Actinic keratosis: Secondary | ICD-10-CM | POA: Diagnosis not present

## 2023-10-31 ENCOUNTER — Other Ambulatory Visit (HOSPITAL_BASED_OUTPATIENT_CLINIC_OR_DEPARTMENT_OTHER): Payer: Self-pay

## 2023-10-31 ENCOUNTER — Non-Acute Institutional Stay: Payer: PPO | Admitting: Internal Medicine

## 2023-10-31 VITALS — BP 120/78 | HR 60 | Temp 97.9°F | Ht 69.0 in | Wt 216.8 lb

## 2023-10-31 DIAGNOSIS — G4733 Obstructive sleep apnea (adult) (pediatric): Secondary | ICD-10-CM | POA: Diagnosis not present

## 2023-10-31 DIAGNOSIS — D500 Iron deficiency anemia secondary to blood loss (chronic): Secondary | ICD-10-CM

## 2023-10-31 DIAGNOSIS — R195 Other fecal abnormalities: Secondary | ICD-10-CM

## 2023-10-31 DIAGNOSIS — I5032 Chronic diastolic (congestive) heart failure: Secondary | ICD-10-CM

## 2023-10-31 DIAGNOSIS — I480 Paroxysmal atrial fibrillation: Secondary | ICD-10-CM

## 2023-10-31 DIAGNOSIS — E785 Hyperlipidemia, unspecified: Secondary | ICD-10-CM

## 2023-10-31 DIAGNOSIS — C182 Malignant neoplasm of ascending colon: Secondary | ICD-10-CM | POA: Diagnosis not present

## 2023-10-31 DIAGNOSIS — G3184 Mild cognitive impairment, so stated: Secondary | ICD-10-CM | POA: Diagnosis not present

## 2023-10-31 DIAGNOSIS — I2510 Atherosclerotic heart disease of native coronary artery without angina pectoris: Secondary | ICD-10-CM | POA: Diagnosis not present

## 2023-10-31 DIAGNOSIS — I10 Essential (primary) hypertension: Secondary | ICD-10-CM

## 2023-10-31 DIAGNOSIS — M545 Low back pain, unspecified: Secondary | ICD-10-CM | POA: Diagnosis not present

## 2023-10-31 DIAGNOSIS — J849 Interstitial pulmonary disease, unspecified: Secondary | ICD-10-CM | POA: Diagnosis not present

## 2023-10-31 DIAGNOSIS — N528 Other male erectile dysfunction: Secondary | ICD-10-CM

## 2023-10-31 DIAGNOSIS — M5136 Other intervertebral disc degeneration, lumbar region with discogenic back pain only: Secondary | ICD-10-CM | POA: Diagnosis not present

## 2023-10-31 MED ORDER — DOFETILIDE 500 MCG PO CAPS
500.0000 ug | ORAL_CAPSULE | Freq: Two times a day (BID) | ORAL | 1 refills | Status: DC
  Filled 2023-10-31: qty 180, 90d supply, fill #0
  Filled 2024-01-29: qty 180, 90d supply, fill #1

## 2023-10-31 NOTE — Progress Notes (Unsigned)
 Location:  [facility] features editor of Service:  Clinic (12)  Provider:   Code Status: *** Goals of Care:     10/31/2023    2:22 PM  Advanced Directives  Does Patient Have a Medical Advance Directive? Yes  Type of Estate agent of [place];Living will;Out of facility DNR (pink MOST or yellow form)  Does patient want to make changes to medical advance directive? No - Patient declined  Copy of Healthcare Power of Attorney in Chart? Yes - validated most recent copy scanned in chart (See row information)     Chief Complaint  Patient presents with   Medical Management of Chronic Issues    6 Month follow up.    HPI: Patient is a 89 y.o. male seen today for medical management of chronic diseases.     Past Medical History:  Diagnosis Date   Abnormal CT scan, stomach 02/2014   Thickening of gastric fundus and cardia.  gastritis on EGD 03/2014   Allergic rhinitis due to pollen    Anemia 12/2020   Arthritis    joints,finger   Arthropathy, unspecified, site unspecified    Atherosclerosis    a. Noted by abdominal CT 02/2014 (h/o normal nuc 2011).   Atrial flutter (HCC) 02/24/2014   s/p ablation 11/15   Blood transfusion without reported diagnosis    Cardiomyopathy (HCC)    tachy mediated - a. TEE (10/15):  EF 30%;  b. Echo after NSR restored (10/15):  mild LVH, EF 55-60%, mild AS, mild AI, mild MR, mild to mod LAE, mild RAE   Cataract    bilateral,removed replaced with implants   CHF (congestive heart failure) (HCC) 01/04/2021   COVID-19 11/2020   Diverticulosis    severe in descending, sigmoid colon.    ED (erectile dysfunction)    Emphysema of lung (HCC)    Esophageal stricture 03/2014   traversable with endoscope. not dilated.    Gastric AVM    GERD (gastroesophageal reflux disease) 03/2014   small HH and gastritis on EGD   GI bleed    Habitual alcohol  use    Hemorrhoids    Hyperlipidemia    Hypertension    Obesity    OSA on  CPAP    PAF (paroxysmal atrial fibrillation) (HCC)    Pneumonia 12/2020   mild with covid   Presence of permanent cardiac pacemaker    Prostatitis, unspecified    Pulmonary fibrosis (HCC)    Sinus bradycardia    a. s/p STJ dual chamber PPM   Skin cancer    basal and squamous cell   Sleep apnea    Substance abuse (HCC)    etoh    Past Surgical History:  Procedure Laterality Date   ATRIAL FLUTTER ABLATION N/A 03/19/2014   RFCA of atrial flutter by Dr Rodolfo Clan   CARDIOVERSION N/A 02/24/2014   Procedure: CARDIOVERSION;  Surgeon: Lenise Quince, MD;  Location: [facility] ENDOSCOPY;  Service: Cardiovascular;  Laterality: N/A;   CARDIOVERSION Right 09/10/2014   Procedure: CARDIOVERSION;  Surgeon: Verona Goodwill, MD;  Location: [facility] CATH LAB;  Service: Cardiovascular;  Laterality: Right;   CARDIOVERSION N/A 01/22/2018   Procedure: CARDIOVERSION;  Surgeon: Elmyra Haggard, MD;  Location: [facility] ENDOSCOPY;  Service: Cardiovascular;  Laterality: N/A;   COLECTOMY  03/24/2021   COLONOSCOPY N/A 04/23/2015   Procedure: COLONOSCOPY;  Surgeon: Asencion Blacksmith, MD;  Location: [facility] ENDOSCOPY;  Service: Endoscopy;  Laterality: N/A;   COLONOSCOPY  ELBOW SURGERY Right    SCREW PLACED   ENTEROSCOPY N/A 06/12/2015   Procedure: ENTEROSCOPY;  Surgeon: Asencion Blacksmith, MD;  Location: [facility] ENDOSCOPY;  Service: Endoscopy;  Laterality: N/A;   EP IMPLANTABLE DEVICE N/A 02/15/2016   Procedure: Pacemaker Implant;  Surgeon: Tammie Fall, MD;  Location: [facility] INVASIVE CV LAB;  Service: Cardiovascular;  Laterality: N/A;   ESOPHAGOGASTRODUODENOSCOPY N/A 04/09/2014   Procedure: ESOPHAGOGASTRODUODENOSCOPY (EGD);  Surgeon: Claudette Cue, MD;  Location: [facility] ENDOSCOPY;  Service: Endoscopy;  Laterality: N/A;   ESOPHAGOGASTRODUODENOSCOPY N/A 04/23/2015   Procedure: ESOPHAGOGASTRODUODENOSCOPY (EGD);  Surgeon: Asencion Blacksmith, MD;  Location: [facility] ENDOSCOPY;  Service: Endoscopy;  Laterality: N/A;   INGUINAL HERNIA REPAIR     right    INGUINAL HERNIA REPAIR     left   LEFT HEART CATH AND CORONARY ANGIOGRAPHY N/A 03/16/2017   Procedure: LEFT HEART CATH AND CORONARY ANGIOGRAPHY;  Surgeon: Odie Benne, MD;  Location: [facility] INVASIVE CV LAB;  Service: Cardiovascular;  Laterality: N/A;   ORIF fracture of the elbow  1992   SKIN CANCER EXCISION  05/21/2012   Squamous cell ca   TEE WITHOUT CARDIOVERSION N/A 02/24/2014   Procedure: TRANSESOPHAGEAL ECHOCARDIOGRAM (TEE);  Surgeon: Lenise Quince, MD;  Location: [facility] ENDOSCOPY;  Service: Cardiovascular;  Laterality: N/A;   TONSILLECTOMY     AGE 10   UPPER GASTROINTESTINAL ENDOSCOPY      No Known Allergies  Outpatient Encounter Medications as of 10/31/2023  Medication Sig   acetaminophen  (TYLENOL ) 500 MG tablet Take 500 mg by mouth as needed for moderate pain or headache.   albuterol  (VENTOLIN  HFA) 108 (90 Base) MCG/ACT inhaler INHALE TWO PUFFS INTO THE LUNGS EVERY 6 HOURS AS NEEDED FOR WHEEZEING OR SHORTNESS OF BREATH   apixaban  (ELIQUIS ) 5 MG TABS tablet Take 1 tablet (5 mg total) by mouth 2 (two) times daily.   carboxymethylcellulose (REFRESH PLUS) 0.5 % SOLN Place 1 drop into both eyes daily as needed (dry eyes).   colestipol  (COLESTID ) 5 g granules Take 5 g by mouth daily.   diltiazem  (CARDIZEM  CD) 120 MG 24 hr capsule Take 1 capsule (120 mg total) by mouth 2 (two) times daily, at breakfast and at bedtime.   dofetilide  (TIKOSYN ) 500 MCG capsule Take 1 capsule (500 mcg total) by mouth 2 (two) times daily.   furosemide  (LASIX ) 20 MG tablet Take 1 tablet (20 mg total) by mouth 2 (two) times daily.   magnesium  oxide (MAG-OX) 400 MG tablet Take 1 tablet (400 mg total) by mouth at bedtime.   metoprolol  tartrate (LOPRESSOR ) 25 MG tablet Take 1 tablet (25 mg total) by mouth 2 (two) times daily.   Pancrelipase , Lip-Prot-Amyl, (ZENPEP ) [id number] units CPEP Take 2 capsules by mouth 3 (three) times daily with meals. Take 1 capsule by mouth with each snack (maximum 2 snacks daily)    Pedialyte (PEDIALYTE) SOLN Take 178 mLs by mouth daily. 6 oz   rosuvastatin  (CRESTOR ) 10 MG tablet Take 1 tablet (10 mg total) by mouth daily.   sildenafil (REVATIO) 20 MG tablet Take 20 mg by mouth daily as needed (erectile dysfunction).   sodium chloride  (OCEAN) 0.65 % SOLN nasal spray Place 1 spray into both nostrils as needed for congestion.   [DISCONTINUED] dofetilide  (TIKOSYN ) 500 MCG capsule Take 1 capsule (500 mcg total) by mouth 2 (two) times daily.   No facility-administered encounter medications on file as of 10/31/2023.    Review of Systems:  Review of Systems  Health Maintenance  Topic  Date Due   Medicare Annual Wellness (AWV)  11/28/2023   COVID-19 Vaccine (9 - Moderna risk 2024-25 season) 02/13/2024 (Originally 07/31/2023)   INFLUENZA VACCINE  12/15/2023   DTaP/Tdap/Td (3 - Td or Tdap) 04/16/2028   Pneumococcal Vaccine: 50+ Years  Completed   Zoster Vaccines- Shingrix   Completed   HPV VACCINES  Aged Out   Meningococcal B Vaccine  Aged Out    Physical Exam: Vitals:   10/31/23 1422  BP: 120/78  Pulse: 60  Temp: 97.9 F (36.6 C)  SpO2: 97%  Weight: 216 lb 12.8 oz (98.3 kg)  Height: 5' 9 (1.753 m)   Body mass index is 32.02 kg/m. Physical Exam  Labs reviewed: Basic Metabolic Panel: Recent Labs    03/14/23 1156 03/14/23 1222 04/26/23 1331 05/04/23 0000 08/01/23 1140  NA 138  --  141 140 138  K 4.8  --  5.1 4.9 4.7  CL 103  --  102 104 104  CO2 29  --  22 23* 29  GLUCOSE 95  --  96  --  94  BUN 19  --  22 25* 23  CREATININE 0.96   < > 1.02 1.1 1.11  CALCIUM  10.2  --  9.5 9.4 9.7  TSH  --   --   --  0.60  --    < > = values in this interval not displayed.   Liver Function Tests: Recent Labs    05/04/23 0000  AST 26  ALT 24  ALKPHOS 78  ALBUMIN 4.3   No results for input(s): LIPASE, AMYLASE in the last 8760 hours. No results for input(s): AMMONIA in the last 8760 hours. CBC: No results for input(s): WBC, NEUTROABS, HGB,  HCT, MCV, PLT in the last 8760 hours. Lipid Panel: Recent Labs    05/04/23 0000  CHOL 121  HDL 50  TRIG 74   Lab Results  Component Value Date   HGBA1C 5.5 03/08/2021    Procedures since last visit: No results found.  Assessment/Plan There are no diagnoses linked to this encounter.   Labs/tests ordered:  * No order type specified * Next appt:  Visit date not found

## 2023-11-01 NOTE — Progress Notes (Signed)
 11/01/2023 Donald [name] [id number] 09-24-1932   Chief Complaint: Diarrhea   History of Present Illness: Donald [name] is an 89 year old male with a past medial history of arthritis, hypertension, hyperlipidemia, atrial fib/flutter s/p ablation 2015 on Eliquis , s/p pacemaker placement 02/2016, CHF, sleep apnea uses CPAP, pulmonary fibrosis/ILD, iron  deficiency anemia, GERD, gastric angioectasia, Schatzki's ring, hiatal hernia, colonic diverticulosis, IBS, possible SIBO and right-sided colon cancer status post resection with intra-abdominal metastasis. He is known by Dr. Albertus. He presents today for follow up regarding diarrhea. He endorses having chronic loose stools since he underwent surgery and chemotherapy for colon cancer. His diarrhea has been fairly stable on Colestipol  granules and Zenpep  tid with meals and 1 capsule with snacks. He is passing 3 to 4 oatmeal like stools daily. Prior to taking Zenpep , he passed more urgent looser stools 3 to 4 times daily. No fecal incontinence. No bloody or black stools. No abdominal pain. He questions if he can stop Zenpep  for one week to see if he needs to continue to take it or not. Zenpep  is covered by his health insurance. He uses lactose free milk and occasionally eats ice cream or yogurt. His most recent colonoscopy 06/06/2022 identified a one 7 mm serrated adenoma removed from the descending colon and there was healthy appearing and decide ileocolonic anastomosis in the ascending colon and diverticulosis in the left colon. His next surveillance colonoscopy is due 05/2025. His most recent PET scan showed hypermetabolic soft tissue mass in the small bowel mesentery concerning for metastatic focus as well as right sided retroperitoneal inferior to the right kidney and lateral to the right psoas metastasis. CTAP 07/2023 showed mild increase in size of right abdominal anterior and RLQ peritoneal metastasis without new metastatic disease and also showed  stable sigmoid diverticulosis without diverticulitis and a moderately enlarged prostate.  He is scheduled for a surveillance chest/abdominal/pulm as ordered by Dr. Cloretta 12/07/2023.  He denies having any dysphagia symptoms as long as he avoids drinking cold liquids.       Latest Ref Rng & Units 10/24/2022   11:39 AM 04/06/2022    8:55 AM 09/20/2021    7:51 AM  CBC  WBC 3.4 - 10.8 x10E3/uL 9.6  8.8  8.1   Hemoglobin 13.0 - 17.7 g/dL 85.0  84.1  85.7   Hematocrit 37.5 - 51.0 % 45.0  46.6  42.1   Platelets 150 - 450 x10E3/uL 186  221  195        Latest Ref Rng & Units 08/01/2023   11:40 AM 05/04/2023   12:00 AM 04/26/2023    1:31 PM  CMP  Glucose 70 - 99 mg/dL 94   96   BUN 8 - 23 mg/dL 23  25     22    Creatinine 0.61 - 1.24 mg/dL 8.88  1.1     8.97   Sodium 135 - 145 mmol/L 138  140     141   Potassium 3.5 - 5.1 mmol/L 4.7  4.9     5.1   Chloride 98 - 111 mmol/L 104  104     102   CO2 22 - 32 mmol/L 29  23     22    Calcium  8.9 - 10.3 mg/dL 9.7  9.4     9.5   Alkaline Phos 25 - 125  78       AST 14 - 40  26       ALT 10 -  40 U/L  24          This result is from an external source.     Past Medical History:  Diagnosis Date   Abnormal CT scan, stomach 02/2014   Thickening of gastric fundus and cardia.  gastritis on EGD 03/2014   Allergic rhinitis due to pollen    Anemia 12/2020   Arthritis    joints,finger   Arthropathy, unspecified, site unspecified    Atherosclerosis    a. Noted by abdominal CT 02/2014 (h/o normal nuc 2011).   Atrial flutter (HCC) 02/24/2014   s/p ablation 11/15   Blood transfusion without reported diagnosis    Cardiomyopathy (HCC)    tachy mediated - a. TEE (10/15):  EF 30%;  b. Echo after NSR restored (10/15):  mild LVH, EF 55-60%, mild AS, mild AI, mild MR, mild to mod LAE, mild RAE   Cataract    bilateral,removed replaced with implants   CHF (congestive heart failure) (HCC) 01/04/2021   COVID-19 11/2020   Diverticulosis    severe in descending,  sigmoid colon.    ED (erectile dysfunction)    Emphysema of lung (HCC)    Esophageal stricture 03/2014   traversable with endoscope. not dilated.    Gastric AVM    GERD (gastroesophageal reflux disease) 03/2014   small HH and gastritis on EGD   GI bleed    Habitual alcohol  use    Hemorrhoids    Hyperlipidemia    Hypertension    Obesity    OSA on CPAP    PAF (paroxysmal atrial fibrillation) (HCC)    Pneumonia 12/2020   mild with covid   Presence of permanent cardiac pacemaker    Prostatitis, unspecified    Pulmonary fibrosis (HCC)    Sinus bradycardia    a. s/p STJ dual chamber PPM   Skin cancer    basal and squamous cell   Sleep apnea    Substance abuse (HCC)    etoh   Past Surgical History:  Procedure Laterality Date   ATRIAL FLUTTER ABLATION N/A 03/19/2014   RFCA of atrial flutter by Dr Fernande   CARDIOVERSION N/A 02/24/2014   Procedure: CARDIOVERSION;  Surgeon: Redell GORMAN Shallow, MD;  Location: [facility] ENDOSCOPY;  Service: Cardiovascular;  Laterality: N/A;   CARDIOVERSION Right 09/10/2014   Procedure: CARDIOVERSION;  Surgeon: Elspeth JAYSON Fernande, MD;  Location: [facility] CATH LAB;  Service: Cardiovascular;  Laterality: Right;   CARDIOVERSION N/A 01/22/2018   Procedure: CARDIOVERSION;  Surgeon: Okey Vina GAILS, MD;  Location: [facility] ENDOSCOPY;  Service: Cardiovascular;  Laterality: N/A;   COLECTOMY  03/24/2021   COLONOSCOPY N/A 04/23/2015   Procedure: COLONOSCOPY;  Surgeon: Gwendlyn ONEIDA Buddy, MD;  Location: [facility] ENDOSCOPY;  Service: Endoscopy;  Laterality: N/A;   COLONOSCOPY     ELBOW SURGERY Right    SCREW PLACED   ENTEROSCOPY N/A 06/12/2015   Procedure: ENTEROSCOPY;  Surgeon: Gwendlyn ONEIDA Buddy, MD;  Location: [facility] ENDOSCOPY;  Service: Endoscopy;  Laterality: N/A;   EP IMPLANTABLE DEVICE N/A 02/15/2016   Procedure: Pacemaker Implant;  Surgeon: Danelle LELON Birmingham, MD;  Location: [facility] INVASIVE CV LAB;  Service: Cardiovascular;  Laterality: N/A;   ESOPHAGOGASTRODUODENOSCOPY N/A 04/09/2014   Procedure:  ESOPHAGOGASTRODUODENOSCOPY (EGD);  Surgeon: Lamar JONETTA Aho, MD;  Location: [facility] ENDOSCOPY;  Service: Endoscopy;  Laterality: N/A;   ESOPHAGOGASTRODUODENOSCOPY N/A 04/23/2015   Procedure: ESOPHAGOGASTRODUODENOSCOPY (EGD);  Surgeon: Gwendlyn ONEIDA Buddy, MD;  Location: [facility] ENDOSCOPY;  Service: Endoscopy;  Laterality: N/A;   INGUINAL HERNIA REPAIR  right   INGUINAL HERNIA REPAIR     left   LEFT HEART CATH AND CORONARY ANGIOGRAPHY N/A 03/16/2017   Procedure: LEFT HEART CATH AND CORONARY ANGIOGRAPHY;  Surgeon: Verlin Lonni BIRCH, MD;  Location: [facility] INVASIVE CV LAB;  Service: Cardiovascular;  Laterality: N/A;   ORIF fracture of the elbow  1992   SKIN CANCER EXCISION  05/21/2012   Squamous cell ca   TEE WITHOUT CARDIOVERSION N/A 02/24/2014   Procedure: TRANSESOPHAGEAL ECHOCARDIOGRAM (TEE);  Surgeon: Redell GORMAN Shallow, MD;  Location: [facility] ENDOSCOPY;  Service: Cardiovascular;  Laterality: N/A;   TONSILLECTOMY     AGE 15   UPPER GASTROINTESTINAL ENDOSCOPY     Current Outpatient Medications on File Prior to Visit  Medication Sig Dispense Refill   acetaminophen  (TYLENOL ) 500 MG tablet Take 500 mg by mouth as needed for moderate pain or headache.     albuterol  (VENTOLIN  HFA) 108 (90 Base) MCG/ACT inhaler INHALE TWO PUFFS INTO THE LUNGS EVERY 6 HOURS AS NEEDED FOR WHEEZEING OR SHORTNESS OF BREATH 8.5 g 3   apixaban  (ELIQUIS ) 5 MG TABS tablet Take 1 tablet (5 mg total) by mouth 2 (two) times daily. 60 tablet 5   carboxymethylcellulose (REFRESH PLUS) 0.5 % SOLN Place 1 drop into both eyes daily as needed (dry eyes).     colestipol  (COLESTID ) 5 g granules Take 5 g by mouth daily. 500 g 12   diltiazem  (CARDIZEM  CD) 120 MG 24 hr capsule Take 1 capsule (120 mg total) by mouth 2 (two) times daily, at breakfast and at bedtime. 180 capsule 3   dofetilide  (TIKOSYN ) 500 MCG capsule Take 1 capsule (500 mcg total) by mouth 2 (two) times daily. 180 capsule 1   furosemide  (LASIX ) 20 MG tablet Take 1 tablet (20 mg total)  by mouth 2 (two) times daily. 180 tablet 3   magnesium  oxide (MAG-OX) 400 MG tablet Take 1 tablet (400 mg total) by mouth at bedtime. 120 tablet 1   metoprolol  tartrate (LOPRESSOR ) 25 MG tablet Take 1 tablet (25 mg total) by mouth 2 (two) times daily. 180 tablet 3   Pancrelipase , Lip-Prot-Amyl, (ZENPEP ) 60000-189600 units CPEP Take 2 capsules by mouth 3 (three) times daily with meals. Take 1 capsule by mouth with each snack (maximum 2 snacks daily) 300 capsule 2   Pedialyte (PEDIALYTE) SOLN Take 178 mLs by mouth daily. 6 oz     rosuvastatin  (CRESTOR ) 10 MG tablet Take 1 tablet (10 mg total) by mouth daily. 90 tablet 3   sildenafil (REVATIO) 20 MG tablet Take 20 mg by mouth daily as needed (erectile dysfunction).     sodium chloride  (OCEAN) 0.65 % SOLN nasal spray Place 1 spray into both nostrils as needed for congestion.     No current facility-administered medications on file prior to visit.   No Known Allergies  Current Medications, Allergies, Past Medical History, Past Surgical History, Family History and Social History were reviewed in [employer] record.  Review of Systems:   Constitutional: Negative for fever, sweats, chills or weight loss.  Respiratory: Negative for shortness of breath.   Cardiovascular: Negative for chest pain, palpitations and leg swelling.  Gastrointestinal: See HPI.  Musculoskeletal: Negative for back pain or muscle aches.  Neurological: Negative for dizziness, headaches or paresthesias.   Physical Exam: BP 122/62   Pulse 60   Ht 5' 9 (1.753 m)   Wt 216 lb (98 kg)   BMI 31.90 kg/m  Wt Readings from Last 3 Encounters:  11/06/23 216  lb (98 kg)  10/31/23 216 lb 12.8 oz (98.3 kg)  08/07/23 219 lb 4.8 oz (99.5 kg)    General: Delightful 89 year old male in no acute distress. Head: Normocephalic and atraumatic. Eyes: No scleral icterus. Conjunctiva pink . Ears: Normal auditory acuity. Mouth: Dentition intact. No ulcers or lesions.   Lungs: Clear throughout to auscultation. Heart: Regular rate and rhythm, no murmur. Abdomen: Soft, nontender and nondistended. No masses or hepatomegaly. Normal bowel sounds x 4 quadrants.  Rectal: Deferred.  Musculoskeletal: Symmetrical with no gross deformities. Extremities: No edema. Neurological: Alert oriented x 4. No focal deficits.  Psychological: Alert and cooperative. Normal mood and affect  Assessment and Recommendations:  89 year old male with a history of colon adenocarcinoma stage III s/p partial colectomy 03/2021 with intra-abdominal metastasis. Surveillance chest/abd/pelvic CT scheduled 12/07/2023. -Continue follow up with Dr. Cloretta   Chronic diarrhea since undergoing colon cancer treatment and surgery. Patient passes 3-4 soft (oatmeal consistency) stools daily on Zenpep  and Colestipol . -I recommended for the patient to continue Zenpep  60K  2 capsules p.o. 3 times daily with meals and 1 capsule with snacks as well as Colestipol  5 grams. Patient instructed not to take Colestipol  within 4 hours of any other medication. -Patient to contact our office if he develops loose or urgent stools  -Lactose free dairy products  -Follow up in 6 months and as needed   IDA secondary to colon cancer  -Recommend checking a CBC next appointment with Dr. Cloretta Snooks ring, no dysphagia as long as he avoids drinking clear liquids. -Patient instructed to contact office if dysphagia symptoms recur  Afib/flutter on Eliquis . CHADS-VASc score 5.

## 2023-11-02 DIAGNOSIS — M545 Low back pain, unspecified: Secondary | ICD-10-CM | POA: Diagnosis not present

## 2023-11-02 DIAGNOSIS — M5136 Other intervertebral disc degeneration, lumbar region with discogenic back pain only: Secondary | ICD-10-CM | POA: Diagnosis not present

## 2023-11-06 ENCOUNTER — Encounter: Payer: Self-pay | Admitting: Nurse Practitioner

## 2023-11-06 ENCOUNTER — Other Ambulatory Visit (HOSPITAL_BASED_OUTPATIENT_CLINIC_OR_DEPARTMENT_OTHER): Payer: Self-pay

## 2023-11-06 ENCOUNTER — Ambulatory Visit: Admitting: Nurse Practitioner

## 2023-11-06 ENCOUNTER — Other Ambulatory Visit: Payer: Self-pay | Admitting: Internal Medicine

## 2023-11-06 ENCOUNTER — Other Ambulatory Visit: Payer: Self-pay

## 2023-11-06 ENCOUNTER — Telehealth: Payer: Self-pay

## 2023-11-06 VITALS — BP 122/62 | HR 60 | Ht 69.0 in | Wt 216.0 lb

## 2023-11-06 DIAGNOSIS — D509 Iron deficiency anemia, unspecified: Secondary | ICD-10-CM | POA: Diagnosis not present

## 2023-11-06 DIAGNOSIS — I4819 Other persistent atrial fibrillation: Secondary | ICD-10-CM

## 2023-11-06 DIAGNOSIS — R197 Diarrhea, unspecified: Secondary | ICD-10-CM

## 2023-11-06 DIAGNOSIS — Z9049 Acquired absence of other specified parts of digestive tract: Secondary | ICD-10-CM | POA: Diagnosis not present

## 2023-11-06 DIAGNOSIS — Z85038 Personal history of other malignant neoplasm of large intestine: Secondary | ICD-10-CM

## 2023-11-06 DIAGNOSIS — K222 Esophageal obstruction: Secondary | ICD-10-CM | POA: Diagnosis not present

## 2023-11-06 DIAGNOSIS — K529 Noninfective gastroenteritis and colitis, unspecified: Secondary | ICD-10-CM

## 2023-11-06 MED ORDER — METOPROLOL TARTRATE 25 MG PO TABS
25.0000 mg | ORAL_TABLET | Freq: Two times a day (BID) | ORAL | 1 refills | Status: DC
  Filled 2023-11-06: qty 180, 90d supply, fill #0
  Filled 2024-02-06: qty 180, 90d supply, fill #1

## 2023-11-06 MED ORDER — DILTIAZEM HCL ER COATED BEADS 120 MG PO CP24
120.0000 mg | ORAL_CAPSULE | Freq: Two times a day (BID) | ORAL | 1 refills | Status: DC
  Filled 2023-11-06: qty 180, 90d supply, fill #0
  Filled 2024-02-06: qty 180, 90d supply, fill #1

## 2023-11-06 MED ORDER — APIXABAN 5 MG PO TABS
5.0000 mg | ORAL_TABLET | Freq: Two times a day (BID) | ORAL | 5 refills | Status: DC
  Filled 2023-11-06 (×2): qty 60, 30d supply, fill #0
  Filled 2023-12-10: qty 60, 30d supply, fill #1
  Filled 2024-01-07: qty 60, 30d supply, fill #2
  Filled 2024-02-11: qty 60, 30d supply, fill #3
  Filled 2024-03-10: qty 60, 30d supply, fill #4
  Filled 2024-04-08: qty 60, 30d supply, fill #5

## 2023-11-06 NOTE — Telephone Encounter (Signed)
 Eliquis  5mg  refill request received. Patient is 89 years old, weight-98kg, Crea-1.11 on 08/01/23, Diagnosis-Afib, and last seen by Dr. Fernande on 04/26/23. Dose is appropriate based on dosing criteria. Will send in refill to requested pharmacy.

## 2023-11-06 NOTE — Patient Instructions (Addendum)
 Continue Zenpep  2 capsules three times daily with meals.  Continue Colestipol  5 gram granules at bedtime. DO NOT take within 4 hours of any other medication.   Please follow up sooner if symptoms increase or worsen  Due to recent changes in healthcare laws, you may see the results of your imaging and laboratory studies on MyChart before your provider has had a chance to review them.  We understand that in some cases there may be results that are confusing or concerning to you. Not all laboratory results come back in the same time frame and the provider may be waiting for multiple results in order to interpret others.  Please give us  48 hours in order for your provider to thoroughly review all the results before contacting the office for clarification of your results.   Thank you for trusting me with your gastrointestinal care!   Donald Shawl, NP _______________________________________________________  If your blood pressure at your visit was 140/90 or greater, please contact your primary care physician to follow up on this.  _______________________________________________________  If you are age 53 or older, your body mass index should be between 23-30. Your Body mass index is 31.9 kg/m. If this is out of the aforementioned range listed, please consider follow up with your Primary Care Provider.  If you are age 58 or younger, your body mass index should be between 19-25. Your Body mass index is 31.9 kg/m. If this is out of the aformentioned range listed, please consider follow up with your Primary Care Provider.   ________________________________________________________  The [facility] providers would like to encourage you to use MYCHART to communicate with providers for non-urgent requests or questions.  Due to long hold times on the telephone, sending your provider a message by [facility] may be a faster and more efficient way to get a response.  Please allow 48 business hours for a  response.  Please remember that this is for non-urgent requests.  _______________________________________________________

## 2023-11-06 NOTE — Telephone Encounter (Signed)
 FYI:  I informed the pt to Continue Colestipol  5 gram granules at bedtime and NOT to take it within 4 hours of any other medication. He states that he can not do that considering other meds that he has to take 2-3 times a day with and without meals. He states that he takes his meds/ has his dinner 2 hours before bedtimes so he will not be able to take the Colestipol  as directed.

## 2023-11-06 NOTE — Telephone Encounter (Signed)
 I spoke to patient and he will take Colestipol  mid day when he does not take any other medications and he will see if that works out for his schedule. He will contact our office if he has watery stools or increased bowel frequency.

## 2023-11-07 DIAGNOSIS — M5136 Other intervertebral disc degeneration, lumbar region with discogenic back pain only: Secondary | ICD-10-CM | POA: Diagnosis not present

## 2023-11-07 DIAGNOSIS — M545 Low back pain, unspecified: Secondary | ICD-10-CM | POA: Diagnosis not present

## 2023-11-08 ENCOUNTER — Other Ambulatory Visit (HOSPITAL_BASED_OUTPATIENT_CLINIC_OR_DEPARTMENT_OTHER): Payer: Self-pay

## 2023-11-09 DIAGNOSIS — M5136 Other intervertebral disc degeneration, lumbar region with discogenic back pain only: Secondary | ICD-10-CM | POA: Diagnosis not present

## 2023-11-09 DIAGNOSIS — M545 Low back pain, unspecified: Secondary | ICD-10-CM | POA: Diagnosis not present

## 2023-11-12 ENCOUNTER — Other Ambulatory Visit: Payer: Self-pay | Admitting: Internal Medicine

## 2023-11-12 DIAGNOSIS — I5032 Chronic diastolic (congestive) heart failure: Secondary | ICD-10-CM

## 2023-11-13 ENCOUNTER — Other Ambulatory Visit (HOSPITAL_BASED_OUTPATIENT_CLINIC_OR_DEPARTMENT_OTHER): Payer: Self-pay

## 2023-11-13 ENCOUNTER — Other Ambulatory Visit: Payer: Self-pay

## 2023-11-13 MED ORDER — FUROSEMIDE 20 MG PO TABS
20.0000 mg | ORAL_TABLET | Freq: Two times a day (BID) | ORAL | 0 refills | Status: DC
  Filled 2023-11-13: qty 180, 90d supply, fill #0

## 2023-11-13 MED ORDER — ZENPEP 60000-189600 UNITS PO CPEP
2.0000 | ORAL_CAPSULE | Freq: Three times a day (TID) | ORAL | 2 refills | Status: DC
  Filled 2023-11-13: qty 300, 37d supply, fill #0
  Filled 2023-12-25: qty 300, 37d supply, fill #1
  Filled 2024-02-11: qty 300, 37d supply, fill #2

## 2023-11-13 MED ORDER — ROSUVASTATIN CALCIUM 10 MG PO TABS
10.0000 mg | ORAL_TABLET | Freq: Every day | ORAL | 3 refills | Status: AC
  Filled 2023-11-13: qty 90, 90d supply, fill #0
  Filled 2024-02-11: qty 90, 90d supply, fill #1
  Filled 2024-04-27 – 2024-05-04 (×2): qty 90, 90d supply, fill #2

## 2023-11-14 ENCOUNTER — Other Ambulatory Visit (HOSPITAL_BASED_OUTPATIENT_CLINIC_OR_DEPARTMENT_OTHER): Payer: Self-pay

## 2023-11-14 ENCOUNTER — Ambulatory Visit (INDEPENDENT_AMBULATORY_CARE_PROVIDER_SITE_OTHER): Payer: PPO

## 2023-11-14 DIAGNOSIS — I495 Sick sinus syndrome: Secondary | ICD-10-CM | POA: Diagnosis not present

## 2023-11-14 DIAGNOSIS — M545 Low back pain, unspecified: Secondary | ICD-10-CM | POA: Diagnosis not present

## 2023-11-14 DIAGNOSIS — M5136 Other intervertebral disc degeneration, lumbar region with discogenic back pain only: Secondary | ICD-10-CM | POA: Diagnosis not present

## 2023-11-14 LAB — CUP PACEART REMOTE DEVICE CHECK
Battery Remaining Longevity: 28 mo
Battery Remaining Percentage: 24 %
Battery Voltage: 2.93 V
Brady Statistic AP VP Percent: 2.8 %
Brady Statistic AP VS Percent: 95 %
Brady Statistic AS VP Percent: 1 %
Brady Statistic AS VS Percent: 1.9 %
Brady Statistic RA Percent Paced: 95 %
Brady Statistic RV Percent Paced: 3 %
Date Time Interrogation Session: 20250701020015
Implantable Lead Connection Status: 753985
Implantable Lead Connection Status: 753985
Implantable Lead Implant Date: 20171002
Implantable Lead Implant Date: 20171002
Implantable Lead Location: 753859
Implantable Lead Location: 753860
Implantable Pulse Generator Implant Date: 20171002
Lead Channel Impedance Value: 410 Ohm
Lead Channel Impedance Value: 600 Ohm
Lead Channel Pacing Threshold Amplitude: 0.5 V
Lead Channel Pacing Threshold Amplitude: 0.5 V
Lead Channel Pacing Threshold Pulse Width: 0.5 ms
Lead Channel Pacing Threshold Pulse Width: 0.5 ms
Lead Channel Sensing Intrinsic Amplitude: 12 mV
Lead Channel Sensing Intrinsic Amplitude: 3.2 mV
Lead Channel Setting Pacing Amplitude: 1.5 V
Lead Channel Setting Pacing Amplitude: 2.5 V
Lead Channel Setting Pacing Pulse Width: 0.5 ms
Lead Channel Setting Sensing Sensitivity: 2 mV
Pulse Gen Model: 2272
Pulse Gen Serial Number: 7951215

## 2023-11-16 DIAGNOSIS — M5136 Other intervertebral disc degeneration, lumbar region with discogenic back pain only: Secondary | ICD-10-CM | POA: Diagnosis not present

## 2023-11-16 DIAGNOSIS — M545 Low back pain, unspecified: Secondary | ICD-10-CM | POA: Diagnosis not present

## 2023-11-21 DIAGNOSIS — M5136 Other intervertebral disc degeneration, lumbar region with discogenic back pain only: Secondary | ICD-10-CM | POA: Diagnosis not present

## 2023-11-21 DIAGNOSIS — M545 Low back pain, unspecified: Secondary | ICD-10-CM | POA: Diagnosis not present

## 2023-11-21 NOTE — Progress Notes (Signed)
 Addendum: Reviewed and agree with assessment and management plan. Asha Grumbine, Carie Caddy, MD

## 2023-11-28 DIAGNOSIS — M545 Low back pain, unspecified: Secondary | ICD-10-CM | POA: Diagnosis not present

## 2023-11-28 DIAGNOSIS — M5136 Other intervertebral disc degeneration, lumbar region with discogenic back pain only: Secondary | ICD-10-CM | POA: Diagnosis not present

## 2023-11-30 DIAGNOSIS — M5136 Other intervertebral disc degeneration, lumbar region with discogenic back pain only: Secondary | ICD-10-CM | POA: Diagnosis not present

## 2023-11-30 DIAGNOSIS — M545 Low back pain, unspecified: Secondary | ICD-10-CM | POA: Diagnosis not present

## 2023-12-04 DIAGNOSIS — G4733 Obstructive sleep apnea (adult) (pediatric): Secondary | ICD-10-CM | POA: Diagnosis not present

## 2023-12-05 DIAGNOSIS — M5136 Other intervertebral disc degeneration, lumbar region with discogenic back pain only: Secondary | ICD-10-CM | POA: Diagnosis not present

## 2023-12-05 DIAGNOSIS — M545 Low back pain, unspecified: Secondary | ICD-10-CM | POA: Diagnosis not present

## 2023-12-07 ENCOUNTER — Inpatient Hospital Stay: Attending: Oncology

## 2023-12-07 ENCOUNTER — Ambulatory Visit (HOSPITAL_BASED_OUTPATIENT_CLINIC_OR_DEPARTMENT_OTHER)
Admission: RE | Admit: 2023-12-07 | Discharge: 2023-12-07 | Disposition: A | Discharge status: Home / Self Care | Source: Ambulatory Visit | Attending: Oncology | Admitting: Oncology

## 2023-12-07 ENCOUNTER — Other Ambulatory Visit

## 2023-12-07 DIAGNOSIS — I4891 Unspecified atrial fibrillation: Secondary | ICD-10-CM | POA: Insufficient documentation

## 2023-12-07 DIAGNOSIS — C182 Malignant neoplasm of ascending colon: Secondary | ICD-10-CM | POA: Insufficient documentation

## 2023-12-07 DIAGNOSIS — C189 Malignant neoplasm of colon, unspecified: Secondary | ICD-10-CM | POA: Diagnosis not present

## 2023-12-07 DIAGNOSIS — Z7901 Long term (current) use of anticoagulants: Secondary | ICD-10-CM | POA: Insufficient documentation

## 2023-12-07 DIAGNOSIS — I700 Atherosclerosis of aorta: Secondary | ICD-10-CM | POA: Diagnosis not present

## 2023-12-07 DIAGNOSIS — M545 Low back pain, unspecified: Secondary | ICD-10-CM | POA: Insufficient documentation

## 2023-12-07 DIAGNOSIS — R590 Localized enlarged lymph nodes: Secondary | ICD-10-CM | POA: Diagnosis not present

## 2023-12-07 DIAGNOSIS — K689 Other disorders of retroperitoneum: Secondary | ICD-10-CM | POA: Diagnosis not present

## 2023-12-07 DIAGNOSIS — J849 Interstitial pulmonary disease, unspecified: Secondary | ICD-10-CM | POA: Diagnosis not present

## 2023-12-07 DIAGNOSIS — Z950 Presence of cardiac pacemaker: Secondary | ICD-10-CM | POA: Diagnosis not present

## 2023-12-07 DIAGNOSIS — R918 Other nonspecific abnormal finding of lung field: Secondary | ICD-10-CM | POA: Diagnosis not present

## 2023-12-07 DIAGNOSIS — M5136 Other intervertebral disc degeneration, lumbar region with discogenic back pain only: Secondary | ICD-10-CM | POA: Diagnosis not present

## 2023-12-07 DIAGNOSIS — R109 Unspecified abdominal pain: Secondary | ICD-10-CM | POA: Insufficient documentation

## 2023-12-07 LAB — BASIC METABOLIC PANEL - CANCER CENTER ONLY
Anion gap: 11 (ref 5–15)
BUN: 22 mg/dL (ref 8–23)
CO2: 26 mmol/L (ref 22–32)
Calcium: 10 mg/dL (ref 8.9–10.3)
Chloride: 103 mmol/L (ref 98–111)
Creatinine: 1.15 mg/dL (ref 0.61–1.24)
GFR, Estimated: >60 mL/min (ref >60)
Glucose, Bld: 97 mg/dL (ref 70–99)
Potassium: 4.8 mmol/L (ref 3.5–5.1)
Sodium: 139 mmol/L (ref 135–145)

## 2023-12-07 LAB — CEA (ACCESS): CEA (CHCC): 1.91 ng/mL (ref 0.00–5.00)

## 2023-12-07 MED ORDER — IOHEXOL 300 MG/ML  SOLN
100.0000 mL | Freq: Once | INTRAMUSCULAR | Status: AC | PRN
  Administered 2023-12-07: 100 mL via INTRAVENOUS

## 2023-12-11 ENCOUNTER — Other Ambulatory Visit (HOSPITAL_BASED_OUTPATIENT_CLINIC_OR_DEPARTMENT_OTHER): Payer: Self-pay

## 2023-12-12 DIAGNOSIS — M545 Low back pain, unspecified: Secondary | ICD-10-CM | POA: Diagnosis not present

## 2023-12-12 DIAGNOSIS — M5136 Other intervertebral disc degeneration, lumbar region with discogenic back pain only: Secondary | ICD-10-CM | POA: Diagnosis not present

## 2023-12-14 ENCOUNTER — Inpatient Hospital Stay: Admitting: Oncology

## 2023-12-14 VITALS — BP 118/63 | HR 60 | Temp 98.1°F | Resp 18 | Ht 69.0 in | Wt 218.3 lb

## 2023-12-14 DIAGNOSIS — C182 Malignant neoplasm of ascending colon: Secondary | ICD-10-CM | POA: Diagnosis not present

## 2023-12-14 DIAGNOSIS — M5136 Other intervertebral disc degeneration, lumbar region with discogenic back pain only: Secondary | ICD-10-CM | POA: Diagnosis not present

## 2023-12-14 DIAGNOSIS — M545 Low back pain, unspecified: Secondary | ICD-10-CM | POA: Diagnosis not present

## 2023-12-14 NOTE — Progress Notes (Signed)
 [facility] OFFICE PROGRESS NOTE   Diagnosis: Colon cancer  INTERVAL HISTORY:   Donald [name] returns as scheduled.  He generally feels well.  Good appetite.  He is exercising in the pool.  He continues to have intermittent low back pain.  He sometimes feels the pain in the lower abdomen.  The pain is not consistent.  The pain is worse with position change.  The pain improves with ambulation.  The pain is not present when he is in bed at night.  He has noted improvement in the pain with physical therapy.  Objective:  Vital signs in last 24 hours:  Blood pressure 118/63, pulse 60, temperature 98.1 F (36.7 C), temperature source Temporal, resp. rate 18, height 5' 9 (1.753 m), weight 218 lb 4.8 oz (99 kg), SpO2 98%.   Lymphatics: No cervical, supraclavicular, axillary, or inguinal nodes Resp: Lungs clear bilaterally Cardio: Rate and rhythm GI: No hepatosplenomegaly, no mass, nontender Musculoskeletal: The area of discomfort is at the bilateral lateral low back   Lab Results:  Lab Results  Component Value Date   WBC 9.6 10/24/2022   HGB 14.9 10/24/2022   HCT 45.0 10/24/2022   MCV 95 10/24/2022   PLT 186 10/24/2022   NEUTROABS 5.8 09/20/2021    CMP  Lab Results  Component Value Date   NA 139 12/07/2023   K 4.8 12/07/2023   CL 103 12/07/2023   CO2 26 12/07/2023   GLUCOSE 97 12/07/2023   BUN 22 12/07/2023   CREATININE 1.15 12/07/2023   CALCIUM  10.0 12/07/2023   PROT 7.1 09/20/2021   ALBUMIN 4.3 05/04/2023   AST 26 05/04/2023   ALT 24 05/04/2023   ALKPHOS 78 05/04/2023   BILITOT 1.0 09/20/2021   GFRNONAA >60 12/07/2023   GFRAA >60 02/25/2019    Lab Results  Component Value Date   CEA 1.91 12/07/2023     Medications: I have reviewed the patient's current medications.   Assessment/Plan:  Colon cancer-stage III (T3N1), cecum mass biopsy 02/03/2021-adenocarcinoma CTs 02/16/2021-stable interstitial lung disease, mild COPD, cardiomegaly, CAD, multiple  liver cysts with a few subcentimeter too small to characterize liver lesions, circumferential wall thickening at the ileocecal valve, right lower quadrant ileocolic lymph node measures 1.1 cm 03/24/2021, robotic assisted laparoscopic right colectomy-T3, 1/8 lymph nodes, 1 tumor deposit, lymphovascular invasion, mismatch repair protein expression intact, MSS: Foundation 1-HRD signature negative, MSS, tumor mutation burden 1, K-ras G12V Cycle 1 adjuvant Xeloda  04/21/2021 Cycle 2 adjuvant Xeloda  05/12/2021 Cycle 3 adjuvant Xeloda  06/07/2021 (5-day delay in start date due to foot discomfort, travel plans) Cycle 4 adjuvant Xeloda  06/29/2021, Xeloda  dose reduced to 1000 mg twice daily for 14 days Cycle 5 adjuvant Xeloda  07/19/2021 Cycle 6 adjuvant Xeloda  08/09/2021 Cycle 7 adjuvant Xeloda  08/30/2021 Cycle 8 adjuvant Xeloda  09/20/2021 CTs 02/14/2022-no evidence of recurrent disease CT abdomen/pelvis 03/14/2023- right central mesentery and right iliac fossa implants PET 02/01/2023: Hypermetabolic right small bowel mesenteric mass, mild focal hypermetabolism in the distal esophagus-nonspecific.  The right pelvic nodule is hypermetabolic. CT abdomen/pelvis 08/01/2023: Mild increase in size of right abdominal mesenteric and right lower quadrant peritoneal metastases, no new site of metastatic disease CTs 12/07/2023:  enlargement of central mesenteric mass, able to centimeter mass adjacent to the right psoas, new pulmonary nodules measuring up to 4 mm CAD Interstitial lung disease Iron  deficiency anemia secondary to #1 Multiple colon polyps on the colonoscopy 02/03/2021-microscopic fragments of adenocarcinoma involving biopsies of transverse and ascending colon polyps felt to be contaminant from the cecum  biopsy 2 tubular adenomas on the right colectomy specimen 03/24/2021 OSA Sick sinus syndrome-pacemaker Atrial fibrillation-maintained on apixaban  Chronic diastolic CHF History of multiple skin cancers        Disposition: Donald. [name] appears stable.  I reviewed the CT findings and images with him.  He appears to have metastatic colon cancer involving mesenteric/retroperitoneal masses and tiny lung nodules.  I think it is unlikely the back and intermittent abdominal pain are related to the mesenteric mass, but this is possible.  He reports improvement in pain with position change and physical therapy.  We discussed treatment options including observation and systemic therapy.  We discussed the expected 30-40% chance of a clinical response with chemotherapy.  We specifically discussed the FOLFOX chemotherapy regimen.  Goals of chemotherapy are to improve symptoms and potentially extend survival.   Donald [name] has multiple comorbid conditions and he will be 89 next month.  It is unclear whether he will experience significant morbidity or mortality related to the colon cancer.  He favors observation for now.  He will return for an office visit in 2 months.  Arley Hof, MD  12/14/2023  12:33 PM

## 2023-12-19 DIAGNOSIS — M545 Low back pain, unspecified: Secondary | ICD-10-CM | POA: Diagnosis not present

## 2023-12-19 DIAGNOSIS — M5136 Other intervertebral disc degeneration, lumbar region with discogenic back pain only: Secondary | ICD-10-CM | POA: Diagnosis not present

## 2023-12-21 DIAGNOSIS — M545 Low back pain, unspecified: Secondary | ICD-10-CM | POA: Diagnosis not present

## 2023-12-21 DIAGNOSIS — M5136 Other intervertebral disc degeneration, lumbar region with discogenic back pain only: Secondary | ICD-10-CM | POA: Diagnosis not present

## 2023-12-25 ENCOUNTER — Other Ambulatory Visit (HOSPITAL_BASED_OUTPATIENT_CLINIC_OR_DEPARTMENT_OTHER): Payer: Self-pay

## 2023-12-26 DIAGNOSIS — M5136 Other intervertebral disc degeneration, lumbar region with discogenic back pain only: Secondary | ICD-10-CM | POA: Diagnosis not present

## 2023-12-26 DIAGNOSIS — M545 Low back pain, unspecified: Secondary | ICD-10-CM | POA: Diagnosis not present

## 2023-12-28 DIAGNOSIS — M545 Low back pain, unspecified: Secondary | ICD-10-CM | POA: Diagnosis not present

## 2023-12-28 DIAGNOSIS — M5136 Other intervertebral disc degeneration, lumbar region with discogenic back pain only: Secondary | ICD-10-CM | POA: Diagnosis not present

## 2024-01-02 DIAGNOSIS — M5136 Other intervertebral disc degeneration, lumbar region with discogenic back pain only: Secondary | ICD-10-CM | POA: Diagnosis not present

## 2024-01-02 DIAGNOSIS — M545 Low back pain, unspecified: Secondary | ICD-10-CM | POA: Diagnosis not present

## 2024-01-06 ENCOUNTER — Other Ambulatory Visit (HOSPITAL_BASED_OUTPATIENT_CLINIC_OR_DEPARTMENT_OTHER): Payer: Self-pay

## 2024-01-06 DIAGNOSIS — I4891 Unspecified atrial fibrillation: Secondary | ICD-10-CM | POA: Diagnosis not present

## 2024-01-06 DIAGNOSIS — D6869 Other thrombophilia: Secondary | ICD-10-CM | POA: Diagnosis not present

## 2024-01-06 DIAGNOSIS — I482 Chronic atrial fibrillation, unspecified: Secondary | ICD-10-CM | POA: Diagnosis not present

## 2024-01-06 DIAGNOSIS — M5459 Other low back pain: Secondary | ICD-10-CM | POA: Diagnosis not present

## 2024-01-06 DIAGNOSIS — M545 Low back pain, unspecified: Secondary | ICD-10-CM | POA: Diagnosis not present

## 2024-01-06 MED ORDER — TRAMADOL HCL 50 MG PO TABS
50.0000 mg | ORAL_TABLET | Freq: Three times a day (TID) | ORAL | 0 refills | Status: DC | PRN
  Filled 2024-01-06: qty 15, 5d supply, fill #0

## 2024-01-07 ENCOUNTER — Other Ambulatory Visit (HOSPITAL_BASED_OUTPATIENT_CLINIC_OR_DEPARTMENT_OTHER): Payer: Self-pay

## 2024-01-12 ENCOUNTER — Other Ambulatory Visit (HOSPITAL_BASED_OUTPATIENT_CLINIC_OR_DEPARTMENT_OTHER): Payer: Self-pay

## 2024-01-18 ENCOUNTER — Encounter: Payer: Self-pay | Admitting: Internal Medicine

## 2024-01-26 ENCOUNTER — Other Ambulatory Visit (HOSPITAL_COMMUNITY): Payer: Self-pay | Admitting: Physical Medicine and Rehabilitation

## 2024-01-26 DIAGNOSIS — M5459 Other low back pain: Secondary | ICD-10-CM

## 2024-01-29 ENCOUNTER — Other Ambulatory Visit: Payer: Self-pay

## 2024-01-29 ENCOUNTER — Other Ambulatory Visit (HOSPITAL_BASED_OUTPATIENT_CLINIC_OR_DEPARTMENT_OTHER): Payer: Self-pay

## 2024-01-29 MED ORDER — TRAMADOL HCL 50 MG PO TABS
50.0000 mg | ORAL_TABLET | Freq: Three times a day (TID) | ORAL | 0 refills | Status: DC | PRN
  Filled 2024-01-29: qty 15, 5d supply, fill #0

## 2024-01-31 DIAGNOSIS — G4733 Obstructive sleep apnea (adult) (pediatric): Secondary | ICD-10-CM | POA: Diagnosis not present

## 2024-02-06 ENCOUNTER — Other Ambulatory Visit: Payer: Self-pay

## 2024-02-12 ENCOUNTER — Other Ambulatory Visit: Payer: Self-pay

## 2024-02-12 ENCOUNTER — Other Ambulatory Visit (HOSPITAL_BASED_OUTPATIENT_CLINIC_OR_DEPARTMENT_OTHER): Payer: Self-pay

## 2024-02-13 ENCOUNTER — Ambulatory Visit: Payer: PPO

## 2024-02-13 DIAGNOSIS — I495 Sick sinus syndrome: Secondary | ICD-10-CM | POA: Diagnosis not present

## 2024-02-14 ENCOUNTER — Inpatient Hospital Stay: Attending: Oncology | Admitting: Oncology

## 2024-02-14 VITALS — BP 118/62 | HR 74 | Temp 98.1°F | Resp 18 | Ht 69.0 in | Wt 217.8 lb

## 2024-02-14 DIAGNOSIS — M549 Dorsalgia, unspecified: Secondary | ICD-10-CM | POA: Insufficient documentation

## 2024-02-14 DIAGNOSIS — C180 Malignant neoplasm of cecum: Secondary | ICD-10-CM | POA: Insufficient documentation

## 2024-02-14 DIAGNOSIS — I4891 Unspecified atrial fibrillation: Secondary | ICD-10-CM | POA: Insufficient documentation

## 2024-02-14 DIAGNOSIS — C786 Secondary malignant neoplasm of retroperitoneum and peritoneum: Secondary | ICD-10-CM | POA: Insufficient documentation

## 2024-02-14 DIAGNOSIS — C182 Malignant neoplasm of ascending colon: Secondary | ICD-10-CM | POA: Diagnosis not present

## 2024-02-14 DIAGNOSIS — Z7901 Long term (current) use of anticoagulants: Secondary | ICD-10-CM | POA: Diagnosis not present

## 2024-02-14 DIAGNOSIS — I5032 Chronic diastolic (congestive) heart failure: Secondary | ICD-10-CM | POA: Diagnosis not present

## 2024-02-14 DIAGNOSIS — G8929 Other chronic pain: Secondary | ICD-10-CM | POA: Insufficient documentation

## 2024-02-14 DIAGNOSIS — Z950 Presence of cardiac pacemaker: Secondary | ICD-10-CM | POA: Diagnosis not present

## 2024-02-14 NOTE — Progress Notes (Signed)
 [facility] OFFICE PROGRESS NOTE   Diagnosis: Colon cancer  INTERVAL HISTORY:   Donald [name] returns as scheduled.  He generally feels well.  Good appetite.  No abdominal pain or nausea.  He has chronic low back pain.  He is followed with Dr. Bonner and is scheduled for an MRI in 2 weeks.  Objective:  Vital signs in last 24 hours:  Blood pressure 118/62, pulse 74, temperature 98.1 F (36.7 C), temperature source Temporal, resp. rate 18, height 5' 9 (1.753 m), weight 217 lb 12.8 oz (98.8 kg), SpO2 98%.   Lymphatics: No cervical, supraclavicular, axillary, or inguinal nodes Resp: Lungs clear bilaterally Cardio: Regular rate and rhythm, 2/6 systolic murmur GI: No hepatosplenomegaly, no mass, mild tenderness in the lower abdomen bilaterally Vascular: No leg edema  Lab Results:  Lab Results  Component Value Date   WBC 9.6 10/24/2022   HGB 14.9 10/24/2022   HCT 45.0 10/24/2022   MCV 95 10/24/2022   PLT 186 10/24/2022   NEUTROABS 5.8 09/20/2021    CMP  Lab Results  Component Value Date   NA 139 12/07/2023   K 4.8 12/07/2023   CL 103 12/07/2023   CO2 26 12/07/2023   GLUCOSE 97 12/07/2023   BUN 22 12/07/2023   CREATININE 1.15 12/07/2023   CALCIUM  10.0 12/07/2023   PROT 7.1 09/20/2021   ALBUMIN 4.3 05/04/2023   AST 26 05/04/2023   ALT 24 05/04/2023   ALKPHOS 78 05/04/2023   BILITOT 1.0 09/20/2021   GFRNONAA >60 12/07/2023   GFRAA >60 02/25/2019    Lab Results  Component Value Date   CEA 1.91 12/07/2023     Medications: I have reviewed the patient's current medications.   Assessment/Plan:  Colon cancer-stage III (T3N1), cecum mass biopsy 02/03/2021-adenocarcinoma CTs 02/16/2021-stable interstitial lung disease, mild COPD, cardiomegaly, CAD, multiple liver cysts with a few subcentimeter too small to characterize liver lesions, circumferential wall thickening at the ileocecal valve, right lower quadrant ileocolic lymph node measures 1.1 cm 03/24/2021,  robotic assisted laparoscopic right colectomy-T3, 1/8 lymph nodes, 1 tumor deposit, lymphovascular invasion, mismatch repair protein expression intact, MSS: Foundation 1-HRD signature negative, MSS, tumor mutation burden 1, K-ras G12V Cycle 1 adjuvant Xeloda  04/21/2021 Cycle 2 adjuvant Xeloda  05/12/2021 Cycle 3 adjuvant Xeloda  06/07/2021 (5-day delay in start date due to foot discomfort, travel plans) Cycle 4 adjuvant Xeloda  06/29/2021, Xeloda  dose reduced to 1000 mg twice daily for 14 days Cycle 5 adjuvant Xeloda  07/19/2021 Cycle 6 adjuvant Xeloda  08/09/2021 Cycle 7 adjuvant Xeloda  08/30/2021 Cycle 8 adjuvant Xeloda  09/20/2021 CTs 02/14/2022-no evidence of recurrent disease CT abdomen/pelvis 03/14/2023- right central mesentery and right iliac fossa implants PET 02/01/2023: Hypermetabolic right small bowel mesenteric mass, mild focal hypermetabolism in the distal esophagus-nonspecific.  The right pelvic nodule is hypermetabolic. CT abdomen/pelvis 08/01/2023: Mild increase in size of right abdominal mesenteric and right lower quadrant peritoneal metastases, no new site of metastatic disease CTs 12/07/2023:  enlargement of central mesenteric mass, able to centimeter mass adjacent to the right psoas, new pulmonary nodules measuring up to 4 mm CAD Interstitial lung disease Iron  deficiency anemia secondary to #1 Multiple colon polyps on the colonoscopy 02/03/2021-microscopic fragments of adenocarcinoma involving biopsies of transverse and ascending colon polyps felt to be contaminant from the cecum biopsy 2 tubular adenomas on the right colectomy specimen 03/24/2021 OSA Sick sinus syndrome-pacemaker Atrial fibrillation-maintained on apixaban  Chronic diastolic CHF History of multiple skin cancers       Disposition: [name] appears stable.  He has  no symptoms to suggest progression of the metastatic colon cancer.  We discussed the indication for observation versus treatment.  He is comfortable with  continued observation and a follow-up CT in approximately 2 months.  I doubt the low back pain is related to colon cancer.  [name] will return for an office visit after the restaging CT in 2 months.  He will call in the interim for new symptoms.  Arley Hof, MD  02/14/2024  10:38 AM

## 2024-02-15 LAB — CUP PACEART REMOTE DEVICE CHECK
Battery Remaining Longevity: 25 mo
Battery Remaining Percentage: 22 %
Battery Voltage: 2.93 V
Brady Statistic AP VP Percent: 2 %
Brady Statistic AP VS Percent: 96 %
Brady Statistic AS VP Percent: 1 %
Brady Statistic AS VS Percent: 2 %
Brady Statistic RA Percent Paced: 96 %
Brady Statistic RV Percent Paced: 2.2 %
Date Time Interrogation Session: 20250930020012
Implantable Lead Connection Status: 753985
Implantable Lead Connection Status: 753985
Implantable Lead Implant Date: 20171002
Implantable Lead Implant Date: 20171002
Implantable Lead Location: 753859
Implantable Lead Location: 753860
Implantable Pulse Generator Implant Date: 20171002
Lead Channel Impedance Value: 390 Ohm
Lead Channel Impedance Value: 590 Ohm
Lead Channel Pacing Threshold Amplitude: 0.5 V
Lead Channel Pacing Threshold Amplitude: 0.5 V
Lead Channel Pacing Threshold Pulse Width: 0.5 ms
Lead Channel Pacing Threshold Pulse Width: 0.5 ms
Lead Channel Sensing Intrinsic Amplitude: 12 mV
Lead Channel Sensing Intrinsic Amplitude: 2.2 mV
Lead Channel Setting Pacing Amplitude: 1.5 V
Lead Channel Setting Pacing Amplitude: 2.5 V
Lead Channel Setting Pacing Pulse Width: 0.5 ms
Lead Channel Setting Sensing Sensitivity: 2 mV
Pulse Gen Model: 2272
Pulse Gen Serial Number: 7951215

## 2024-02-15 NOTE — Progress Notes (Signed)
 Remote PPM Transmission

## 2024-02-19 DIAGNOSIS — L821 Other seborrheic keratosis: Secondary | ICD-10-CM | POA: Diagnosis not present

## 2024-02-19 DIAGNOSIS — D692 Other nonthrombocytopenic purpura: Secondary | ICD-10-CM | POA: Diagnosis not present

## 2024-02-19 DIAGNOSIS — L820 Inflamed seborrheic keratosis: Secondary | ICD-10-CM | POA: Diagnosis not present

## 2024-02-19 DIAGNOSIS — C44519 Basal cell carcinoma of skin of other part of trunk: Secondary | ICD-10-CM | POA: Diagnosis not present

## 2024-02-19 DIAGNOSIS — D485 Neoplasm of uncertain behavior of skin: Secondary | ICD-10-CM | POA: Diagnosis not present

## 2024-02-19 DIAGNOSIS — Z85828 Personal history of other malignant neoplasm of skin: Secondary | ICD-10-CM | POA: Diagnosis not present

## 2024-02-19 DIAGNOSIS — D1801 Hemangioma of skin and subcutaneous tissue: Secondary | ICD-10-CM | POA: Diagnosis not present

## 2024-02-19 DIAGNOSIS — D225 Melanocytic nevi of trunk: Secondary | ICD-10-CM | POA: Diagnosis not present

## 2024-02-19 DIAGNOSIS — C4441 Basal cell carcinoma of skin of scalp and neck: Secondary | ICD-10-CM | POA: Diagnosis not present

## 2024-02-19 DIAGNOSIS — L718 Other rosacea: Secondary | ICD-10-CM | POA: Diagnosis not present

## 2024-02-19 DIAGNOSIS — L814 Other melanin hyperpigmentation: Secondary | ICD-10-CM | POA: Diagnosis not present

## 2024-02-19 DIAGNOSIS — C44712 Basal cell carcinoma of skin of right lower limb, including hip: Secondary | ICD-10-CM | POA: Diagnosis not present

## 2024-02-19 NOTE — Progress Notes (Signed)
 Remote PPM Transmission

## 2024-02-20 ENCOUNTER — Encounter: Payer: Self-pay | Admitting: Oncology

## 2024-02-22 NOTE — CV Procedure (Signed)
  Device system confirmed to be MRI conditional, with implant date > 6 weeks ago, and no evidence of abandoned or epicardial leads in review of most recent CXR  Device last cleared by EP Provider: Daphne Barrack 02/22/24  Clearance is good through for 1 year as long as parameters remain stable at time of check. If pt undergoes a cardiac device procedure during that time, they should be re-cleared.   Tachy-therapies to be programmed off if applicable with device back to pre-MRI settings after completion of exam.  [employer] will be present for programming for the MRI.   Rocky Catalan, RT  02/22/2024 5:09 PM

## 2024-02-29 ENCOUNTER — Ambulatory Visit (HOSPITAL_COMMUNITY)
Admission: RE | Admit: 2024-02-29 | Discharge: 2024-02-29 | Disposition: A | Discharge status: Home / Self Care | Source: Ambulatory Visit | Attending: Physical Medicine and Rehabilitation | Admitting: Physical Medicine and Rehabilitation

## 2024-02-29 DIAGNOSIS — M47816 Spondylosis without myelopathy or radiculopathy, lumbar region: Secondary | ICD-10-CM | POA: Diagnosis not present

## 2024-02-29 DIAGNOSIS — M5459 Other low back pain: Secondary | ICD-10-CM | POA: Diagnosis not present

## 2024-02-29 DIAGNOSIS — M47817 Spondylosis without myelopathy or radiculopathy, lumbosacral region: Secondary | ICD-10-CM | POA: Diagnosis not present

## 2024-02-29 DIAGNOSIS — M5136 Other intervertebral disc degeneration, lumbar region with discogenic back pain only: Secondary | ICD-10-CM | POA: Diagnosis not present

## 2024-02-29 DIAGNOSIS — M5137 Other intervertebral disc degeneration, lumbosacral region with discogenic back pain only: Secondary | ICD-10-CM | POA: Diagnosis not present

## 2024-02-29 NOTE — Progress Notes (Signed)
 Patient was monitored by this RN during MRI scan due to presence of a pacemaker. Cardiac rhythm was continuously monitored throughout the procedure. Prior to the start of the scan, the pacemaker was placed in MRI-safe mode by the pacemaker representative. Patient was set to DOO 80 bpm. Following the completion of the scan, the device was returned to its pre-MRI settings. Neurological status and orientation post-procedure were unchanged from baseline.   Pre-procedure Heart Rate (Prior to being placed in MRI safe mode): 60 bpm Post-procedure Heart Rate (Once pacemaker is returned to baseline mode):  60 bpm

## 2024-03-04 ENCOUNTER — Other Ambulatory Visit: Payer: Self-pay | Admitting: Adult Health

## 2024-03-05 ENCOUNTER — Other Ambulatory Visit (HOSPITAL_BASED_OUTPATIENT_CLINIC_OR_DEPARTMENT_OTHER): Payer: Self-pay

## 2024-03-05 ENCOUNTER — Other Ambulatory Visit: Payer: Self-pay

## 2024-03-05 MED ORDER — COLESTIPOL HCL 5 G PO GRAN
5.0000 g | GRANULES | Freq: Every day | ORAL | 12 refills | Status: DC
  Filled 2024-03-05: qty 500, 100d supply, fill #0

## 2024-03-06 ENCOUNTER — Other Ambulatory Visit (HOSPITAL_BASED_OUTPATIENT_CLINIC_OR_DEPARTMENT_OTHER): Payer: Self-pay

## 2024-03-10 ENCOUNTER — Other Ambulatory Visit: Payer: Self-pay | Admitting: Adult Health

## 2024-03-11 ENCOUNTER — Other Ambulatory Visit: Payer: Self-pay

## 2024-03-11 ENCOUNTER — Other Ambulatory Visit (HOSPITAL_BASED_OUTPATIENT_CLINIC_OR_DEPARTMENT_OTHER): Payer: Self-pay

## 2024-03-11 MED ORDER — MAGNESIUM OXIDE 400 MG PO TABS
400.0000 mg | ORAL_TABLET | Freq: Every day | ORAL | 0 refills | Status: AC
  Filled 2024-03-11: qty 120, 120d supply, fill #0

## 2024-03-12 ENCOUNTER — Other Ambulatory Visit (HOSPITAL_BASED_OUTPATIENT_CLINIC_OR_DEPARTMENT_OTHER): Payer: Self-pay

## 2024-03-14 ENCOUNTER — Other Ambulatory Visit (HOSPITAL_BASED_OUTPATIENT_CLINIC_OR_DEPARTMENT_OTHER): Payer: Self-pay

## 2024-03-14 DIAGNOSIS — M5136 Other intervertebral disc degeneration, lumbar region with discogenic back pain only: Secondary | ICD-10-CM | POA: Diagnosis not present

## 2024-03-14 DIAGNOSIS — M47819 Spondylosis without myelopathy or radiculopathy, site unspecified: Secondary | ICD-10-CM | POA: Diagnosis not present

## 2024-03-14 DIAGNOSIS — M5459 Other low back pain: Secondary | ICD-10-CM | POA: Diagnosis not present

## 2024-03-14 MED ORDER — TRAMADOL HCL 50 MG PO TABS
ORAL_TABLET | ORAL | 0 refills | Status: AC
  Filled 2024-03-14: qty 90, 30d supply, fill #0

## 2024-03-19 ENCOUNTER — Other Ambulatory Visit: Payer: Self-pay

## 2024-03-20 ENCOUNTER — Other Ambulatory Visit (HOSPITAL_BASED_OUTPATIENT_CLINIC_OR_DEPARTMENT_OTHER): Payer: Self-pay

## 2024-03-25 ENCOUNTER — Ambulatory Visit: Payer: Self-pay | Admitting: Internal Medicine

## 2024-03-25 DIAGNOSIS — G4733 Obstructive sleep apnea (adult) (pediatric): Secondary | ICD-10-CM | POA: Diagnosis not present

## 2024-03-26 ENCOUNTER — Other Ambulatory Visit (HOSPITAL_BASED_OUTPATIENT_CLINIC_OR_DEPARTMENT_OTHER): Payer: Self-pay

## 2024-03-26 ENCOUNTER — Other Ambulatory Visit: Payer: Self-pay

## 2024-03-26 ENCOUNTER — Other Ambulatory Visit: Payer: Self-pay | Admitting: Internal Medicine

## 2024-03-26 MED ORDER — ZENPEP 60000-189600 UNITS PO CPEP
2.0000 | ORAL_CAPSULE | Freq: Three times a day (TID) | ORAL | 2 refills | Status: DC
  Filled 2024-03-26: qty 300, 30d supply, fill #0

## 2024-03-27 ENCOUNTER — Other Ambulatory Visit (HOSPITAL_BASED_OUTPATIENT_CLINIC_OR_DEPARTMENT_OTHER): Payer: Self-pay

## 2024-03-27 ENCOUNTER — Telehealth: Payer: Self-pay | Admitting: Student in an Organized Health Care Education/Training Program

## 2024-03-27 ENCOUNTER — Other Ambulatory Visit: Payer: Self-pay | Admitting: Student in an Organized Health Care Education/Training Program

## 2024-03-27 ENCOUNTER — Other Ambulatory Visit: Payer: Self-pay

## 2024-03-27 DIAGNOSIS — I5032 Chronic diastolic (congestive) heart failure: Secondary | ICD-10-CM

## 2024-03-27 MED ORDER — FUROSEMIDE 20 MG PO TABS
20.0000 mg | ORAL_TABLET | Freq: Two times a day (BID) | ORAL | 0 refills | Status: AC
  Filled 2024-03-27: qty 180, 90d supply, fill #0

## 2024-03-27 NOTE — Addendum Note (Signed)
 Addended by: BETHENA MOATS R on: 03/27/2024 03:00 PM   Modules accepted: Orders

## 2024-03-27 NOTE — Telephone Encounter (Signed)
*  STAT* If patient is at the pharmacy, call can be transferred to refill team.   1. Which medications need to be refilled? (please list name of each medication and dose if known) furosemide  (LASIX ) 20 MG tablet   2. Which pharmacy/location (including street and city if local pharmacy) is medication to be sent to? [facility]    3. Do they need a 30 day or 90 day supply?  90 day supply  Pharmacist says patient is completely out of medication.

## 2024-04-08 ENCOUNTER — Other Ambulatory Visit (HOSPITAL_BASED_OUTPATIENT_CLINIC_OR_DEPARTMENT_OTHER): Payer: Self-pay

## 2024-04-15 ENCOUNTER — Ambulatory Visit (HOSPITAL_BASED_OUTPATIENT_CLINIC_OR_DEPARTMENT_OTHER)
Admission: RE | Admit: 2024-04-15 | Discharge: 2024-04-15 | Disposition: A | Source: Ambulatory Visit | Attending: Oncology | Admitting: Oncology

## 2024-04-15 ENCOUNTER — Inpatient Hospital Stay: Attending: Oncology

## 2024-04-15 DIAGNOSIS — K068 Other specified disorders of gingiva and edentulous alveolar ridge: Secondary | ICD-10-CM | POA: Insufficient documentation

## 2024-04-15 DIAGNOSIS — C182 Malignant neoplasm of ascending colon: Secondary | ICD-10-CM | POA: Insufficient documentation

## 2024-04-15 DIAGNOSIS — C180 Malignant neoplasm of cecum: Secondary | ICD-10-CM | POA: Insufficient documentation

## 2024-04-15 DIAGNOSIS — N400 Enlarged prostate without lower urinary tract symptoms: Secondary | ICD-10-CM | POA: Diagnosis not present

## 2024-04-15 DIAGNOSIS — K769 Liver disease, unspecified: Secondary | ICD-10-CM | POA: Insufficient documentation

## 2024-04-15 DIAGNOSIS — C189 Malignant neoplasm of colon, unspecified: Secondary | ICD-10-CM | POA: Diagnosis not present

## 2024-04-15 DIAGNOSIS — R918 Other nonspecific abnormal finding of lung field: Secondary | ICD-10-CM | POA: Insufficient documentation

## 2024-04-15 DIAGNOSIS — C7800 Secondary malignant neoplasm of unspecified lung: Secondary | ICD-10-CM | POA: Diagnosis not present

## 2024-04-15 LAB — BASIC METABOLIC PANEL - CANCER CENTER ONLY
Anion gap: 8 (ref 5–15)
BUN: 21 mg/dL (ref 8–23)
CO2: 27 mmol/L (ref 22–32)
Calcium: 10 mg/dL (ref 8.9–10.3)
Chloride: 106 mmol/L (ref 98–111)
Creatinine: 1.04 mg/dL (ref 0.61–1.24)
GFR, Estimated: >60 mL/min (ref >60)
Glucose, Bld: 96 mg/dL (ref 70–99)
Potassium: 5.2 mmol/L — ABNORMAL HIGH (ref 3.5–5.1)
Sodium: 141 mmol/L (ref 135–145)

## 2024-04-15 LAB — CEA (ACCESS): CEA (CHCC): 2.1 ng/mL (ref 0.00–5.00)

## 2024-04-15 MED ORDER — IOHEXOL 300 MG/ML  SOLN
100.0000 mL | Freq: Once | INTRAMUSCULAR | Status: AC | PRN
  Administered 2024-04-15: 100 mL via INTRAVENOUS

## 2024-04-17 ENCOUNTER — Encounter: Payer: Self-pay | Admitting: Radiology

## 2024-04-17 ENCOUNTER — Inpatient Hospital Stay: Admitting: Oncology

## 2024-04-17 VITALS — BP 114/71 | HR 76 | Temp 97.8°F | Resp 18 | Ht 69.0 in | Wt 216.8 lb

## 2024-04-17 DIAGNOSIS — C180 Malignant neoplasm of cecum: Secondary | ICD-10-CM | POA: Diagnosis not present

## 2024-04-17 DIAGNOSIS — C182 Malignant neoplasm of ascending colon: Secondary | ICD-10-CM

## 2024-04-17 NOTE — Progress Notes (Unsigned)
 Donald [name] POUR, MD  Donald [name], RT Approved for CT guided CORE BX of RIGHT RETROPERITONEAL NODULE.  Hx colon cancer.  See CT 04/15/24 Image 86 Series 2  + sedation  HKM       Previous Messages    ----- Message ----- From: Donald [name], RT Sent: 04/17/2024  11:35 AM EST To: Ir Procedure Requests Subject: CT Biopsy                                      Procedure :CT Biopsy  Reason : h/o colon cancer, lung nodules, peritoneal masses, liver lesion Dx: Malignant neoplasm of ascending colon (HCC) [C18.2 (ICD-10-CM)]  History :CT CHEST ABDOMEN PELVIS W CONTRAST (Accession [id number]) (Order [id number]), MR LUMBAR SPINE WO CONTRAST (Accession [id number]) (Order [id number]), CT CHEST ABDOMEN PELVIS W CONTRAST (Accession [id number]) (Order [id number]), CT ABDOMEN PELVIS W CONTRAST (Accession [id number]) (Order [id number]), NM PET Image Initial (PI) Skull Base To Thigh (F-18 FDG) (Accession [id number]) (Order [id number])  Provider: Cloretta Arley NOVAK, MD  Provider contact ; [phone]

## 2024-04-17 NOTE — Progress Notes (Signed)
 [facility] OFFICE PROGRESS NOTE   Diagnosis: Colon cancer  INTERVAL HISTORY:   Donald [name] returns as scheduled.  He reports a good appetite.  He has frequent bowel movements.  Colestid  and pancreatic enzymes have not helped.  He has low back and abdominal pain.  Objective:  Vital signs in last 24 hours:  Blood pressure 114/71, pulse 76, temperature 97.8 F (36.6 C), temperature source Temporal, resp. rate 18, height 5' 9 (1.753 m), weight 216 lb 12.8 oz (98.3 kg), SpO2 98%.  Lymphatics: No cervical, supraclavicular, axillary, or inguinal nodes Resp: Lungs clear bilaterally Cardio: Regular rate and rhythm, 2/6 systolic murmur GI: No hepatosplenomegaly, no mass, mild diffuse tenderness Vascular: No leg edema  Lab Results:  Lab Results  Component Value Date   WBC 9.6 10/24/2022   HGB 14.9 10/24/2022   HCT 45.0 10/24/2022   MCV 95 10/24/2022   PLT 186 10/24/2022   NEUTROABS 5.8 09/20/2021    CMP  Lab Results  Component Value Date   NA 141 04/15/2024   K 5.2 (H) 04/15/2024   CL 106 04/15/2024   CO2 27 04/15/2024   GLUCOSE 96 04/15/2024   BUN 21 04/15/2024   CREATININE 1.04 04/15/2024   CALCIUM  10.0 04/15/2024   PROT 7.1 09/20/2021   ALBUMIN 4.3 05/04/2023   AST 26 05/04/2023   ALT 24 05/04/2023   ALKPHOS 78 05/04/2023   BILITOT 1.0 09/20/2021   GFRNONAA >60 04/15/2024   GFRAA >60 02/25/2019    Lab Results  Component Value Date   CEA 2.10 04/15/2024    Lab Results  Component Value Date   INR 1.52 06/08/2017   LABPROT 18.2 (H) 06/08/2017    Imaging:  CT CHEST ABDOMEN PELVIS W CONTRAST Result Date: 04/16/2024 CLINICAL DATA:  Restaging metastatic colon cancer. * Tracking Code: BO * EXAM: CT CHEST, ABDOMEN, AND PELVIS WITH CONTRAST TECHNIQUE: Multidetector CT imaging of the chest, abdomen and pelvis was performed following the standard protocol during bolus administration of intravenous contrast. RADIATION DOSE REDUCTION: This exam was  performed according to the departmental dose-optimization program which includes automated exposure control, adjustment of the mA and/or kV according to patient size and/or use of iterative reconstruction technique. CONTRAST:  100mL OMNIPAQUE  IOHEXOL  300 MG/ML  SOLN COMPARISON:  Multiple previous imaging studies. The most recent CT scan is 12/07/1998 FINDINGS: CT CHEST FINDINGS Cardiovascular: The heart is normal in size. No pericardial effusion. The aorta is normal in caliber. No dissection. Stable atherosclerotic calcifications. Stable calcifications at the aortic valve. Stable coronary artery calcifications. The pacer wires are in good position, unchanged. Mediastinum/Nodes: Stable borderline enlarged mediastinal lymph nodes. No new or progressive findings. The esophagus is unremarkable. Small hiatal hernia is stable. Lungs/Pleura: Numerous metastatic pulmonary nodules. Anterior right upper lobe pulmonary nodule on image 52/3 measures 8 mm and previously measured 4 mm. Right middle lobe pulmonary nodule on image 76/3 measures 10 mm and previously measured 4 mm. 9 mm left lower lobe pulmonary nodule on image 116/3 previously measured 4 mm. 6 mm lingular nodule on image 100/3 previously measured 3 mm. Several new small scattered pulmonary nodules. Stable underlying emphysematous changes and pulmonary scarring. No pleural effusions or pleural nodules. Musculoskeletal: No chest wall mass, supraclavicular or axillary adenopathy. Stable 14 mm right thyroid  nodule. No worrisome bone lesions. CT ABDOMEN PELVIS FINDINGS Hepatobiliary: Stable scattered hepatic cysts. New 10 mm low-attenuation lesion in segment 5/6 on image 61/2 is concerning for a metastatic focus. 7 mm lesion in segment 6 on  image 64/2 is indeterminate but stable. The gallbladder is unremarkable.  No common bile duct dilatation. Pancreas: No mass, inflammation or ductal dilatation. Spleen: Normal size.  No focal lesions. Adrenals/Urinary Tract: The  adrenal glands are normal. Stable simple renal cysts. No worrisome renal lesions or hydronephrosis. The bladder is unremarkable. Stomach/Bowel: The stomach, duodenum, small bowel and colon are unremarkable. No mass lesions or obstructive findings. Stable surgical changes from a right hemicolectomy. Stable colonic diverticulosis. Vascular/Lymphatic: Stable advanced atherosclerotic calcification involving the aorta and branch vessels but no aneurysm or dissection. The major venous structures are patent. The ill-defined necrotic appearing central mesenteric mass measures approximately 10.5 x 5.6 cm. This previously measured 6.0 x 5.2 cm. Adjacent mesenteric nodes are again noted. The largest node measures 18 mm on image 80/2. Mesenteric node on image 69/2 measures 15 mm and previously measured 11 mm. Right-sided peritoneal implant on image 86/2 measures 2.7 x 2.3 cm and previously measured 2.2 x 1.6 cm. Reproductive: Stable enlarged prostate gland. The seminal vesicles are unremarkable. Other: No ascites or abdominal wall hernia. Musculoskeletal: No worrisome bone lesions. Stable degenerative changes involving the spine and hips. IMPRESSION: 1. Progressive pulmonary metastatic disease. 2. New 10 mm low-attenuation lesion in segment 5/6 of the liver is concerning for a metastatic focus. 3. Enlarging central mesenteric mass and adjacent mesenteric lymph nodes. 4. Enlarging right-sided peritoneal implant. 5. Stable surgical changes from a right hemicolectomy. 6. Stable advanced atherosclerotic calcification involving the thoracic and abdominal aorta and branch vessels including the coronary arteries. 7. Stable enlarged prostate gland. 8. Aortic atherosclerosis. Aortic Atherosclerosis (ICD10-I70.0). Electronically Signed   By: MYRTIS Stammer M.D.   On: 04/16/2024 15:14    Medications: I have reviewed the patient's current medications.   Assessment/Plan:  Colon cancer-stage III (T3N1), cecum mass biopsy  02/03/2021-adenocarcinoma CTs 02/16/2021-stable interstitial lung disease, mild COPD, cardiomegaly, CAD, multiple liver cysts with a few subcentimeter too small to characterize liver lesions, circumferential wall thickening at the ileocecal valve, right lower quadrant ileocolic lymph node measures 1.1 cm 03/24/2021, robotic assisted laparoscopic right colectomy-T3, 1/8 lymph nodes, 1 tumor deposit, lymphovascular invasion, mismatch repair protein expression intact, MSS: Foundation 1-HRD signature negative, MSS, tumor mutation burden 1, K-ras G12V Cycle 1 adjuvant Xeloda  04/21/2021 Cycle 2 adjuvant Xeloda  05/12/2021 Cycle 3 adjuvant Xeloda  06/07/2021 (5-day delay in start date due to foot discomfort, travel plans) Cycle 4 adjuvant Xeloda  06/29/2021, Xeloda  dose reduced to 1000 mg twice daily for 14 days Cycle 5 adjuvant Xeloda  07/19/2021 Cycle 6 adjuvant Xeloda  08/09/2021 Cycle 7 adjuvant Xeloda  08/30/2021 Cycle 8 adjuvant Xeloda  09/20/2021 CTs 02/14/2022-no evidence of recurrent disease CT abdomen/pelvis 03/14/2023- right central mesentery and right iliac fossa implants PET 02/01/2023: Hypermetabolic right small bowel mesenteric mass, mild focal hypermetabolism in the distal esophagus-nonspecific.  The right pelvic nodule is hypermetabolic. CT abdomen/pelvis 08/01/2023: Mild increase in size of right abdominal mesenteric and right lower quadrant peritoneal metastases, no new site of metastatic disease CTs 12/07/2023:  enlargement of central mesenteric mass, able to centimeter mass adjacent to the right psoas, new pulmonary nodules measuring up to 4 mm CTs 04/15/2024: New and enlarging pulmonary nodules, enlargement of central mesenteric mass and right sided peritoneal implant, new 10 mm low-attenuation segment 5/6 liver lesion, enlargement of lymph nodes adjacent to the mesenteric mass CAD Interstitial lung disease Iron  deficiency anemia secondary to #1 Multiple colon polyps on the colonoscopy  02/03/2021-microscopic fragments of adenocarcinoma involving biopsies of transverse and ascending colon polyps felt to be contaminant from the cecum biopsy 2  tubular adenomas on the right colectomy specimen 03/24/2021 OSA Sick sinus syndrome-pacemaker Atrial fibrillation-maintained on apixaban  Chronic diastolic CHF History of multiple skin cancers       Disposition: Mr. Weisinger has a history of colon cancer.  I reviewed the restaging CT findings and images with him.  He understands the lung nodules, mesenteric/peritoneal masses, and liver lesion likely represent progressive metastatic colon cancer.  We discussed treatment options.  He would like to proceed with treatment as opposed to comfort care.  He understands the goals of treatment are to palliate symptoms and potentially extend survival.  No therapy will be curative.  He would like to proceed with a biopsy to confirm the metastatic colon cancer diagnosis.  He will be referred for a CT-guided biopsy of the mesenteric or peritoneal mass.  He will return for an office visit after the biopsy.  He understands the need for Port-A-Cath placement for administration of chemotherapy.  We will most likely recommend FOLFOX chemotherapy if a diagnosis of metastatic colon cancer is confirmed.  It is possible the abdomen/back pain are related to the mesenteric mass as opposed to chronic benign spine disease.  Arley Hof, MD  04/17/2024  11:04 AM

## 2024-04-22 ENCOUNTER — Encounter (HOSPITAL_COMMUNITY): Payer: Self-pay | Admitting: Oncology

## 2024-04-25 ENCOUNTER — Other Ambulatory Visit: Payer: Self-pay

## 2024-04-25 ENCOUNTER — Other Ambulatory Visit: Payer: Self-pay | Admitting: Student

## 2024-04-25 DIAGNOSIS — Z01818 Encounter for other preprocedural examination: Secondary | ICD-10-CM

## 2024-04-26 ENCOUNTER — Encounter (HOSPITAL_COMMUNITY): Payer: Self-pay

## 2024-04-26 ENCOUNTER — Ambulatory Visit (HOSPITAL_COMMUNITY)
Admission: RE | Admit: 2024-04-26 | Discharge: 2024-04-26 | Disposition: A | Discharge status: Home / Self Care | Source: Ambulatory Visit | Attending: Oncology | Admitting: Oncology

## 2024-04-26 ENCOUNTER — Ambulatory Visit (HOSPITAL_COMMUNITY)
Admission: RE | Admit: 2024-04-26 | Discharge: 2024-04-26 | Disposition: A | Source: Ambulatory Visit | Attending: Oncology | Admitting: Oncology

## 2024-04-26 ENCOUNTER — Other Ambulatory Visit: Payer: Self-pay

## 2024-04-26 DIAGNOSIS — Z85038 Personal history of other malignant neoplasm of large intestine: Secondary | ICD-10-CM | POA: Insufficient documentation

## 2024-04-26 DIAGNOSIS — C7801 Secondary malignant neoplasm of right lung: Secondary | ICD-10-CM | POA: Diagnosis not present

## 2024-04-26 DIAGNOSIS — Z171 Estrogen receptor negative status [ER-]: Secondary | ICD-10-CM | POA: Diagnosis not present

## 2024-04-26 DIAGNOSIS — C7802 Secondary malignant neoplasm of left lung: Secondary | ICD-10-CM | POA: Insufficient documentation

## 2024-04-26 DIAGNOSIS — I2510 Atherosclerotic heart disease of native coronary artery without angina pectoris: Secondary | ICD-10-CM | POA: Insufficient documentation

## 2024-04-26 DIAGNOSIS — K449 Diaphragmatic hernia without obstruction or gangrene: Secondary | ICD-10-CM | POA: Insufficient documentation

## 2024-04-26 DIAGNOSIS — K7689 Other specified diseases of liver: Secondary | ICD-10-CM | POA: Diagnosis not present

## 2024-04-26 DIAGNOSIS — Z7901 Long term (current) use of anticoagulants: Secondary | ICD-10-CM | POA: Diagnosis not present

## 2024-04-26 DIAGNOSIS — R918 Other nonspecific abnormal finding of lung field: Secondary | ICD-10-CM | POA: Diagnosis not present

## 2024-04-26 DIAGNOSIS — Z01818 Encounter for other preprocedural examination: Secondary | ICD-10-CM

## 2024-04-26 DIAGNOSIS — Z66 Do not resuscitate: Secondary | ICD-10-CM | POA: Insufficient documentation

## 2024-04-26 DIAGNOSIS — R590 Localized enlarged lymph nodes: Secondary | ICD-10-CM | POA: Insufficient documentation

## 2024-04-26 DIAGNOSIS — C786 Secondary malignant neoplasm of retroperitoneum and peritoneum: Secondary | ICD-10-CM | POA: Insufficient documentation

## 2024-04-26 DIAGNOSIS — N281 Cyst of kidney, acquired: Secondary | ICD-10-CM | POA: Insufficient documentation

## 2024-04-26 DIAGNOSIS — C189 Malignant neoplasm of colon, unspecified: Secondary | ICD-10-CM | POA: Insufficient documentation

## 2024-04-26 DIAGNOSIS — G804 Ataxic cerebral palsy: Secondary | ICD-10-CM | POA: Insufficient documentation

## 2024-04-26 DIAGNOSIS — N400 Enlarged prostate without lower urinary tract symptoms: Secondary | ICD-10-CM | POA: Diagnosis not present

## 2024-04-26 DIAGNOSIS — F17290 Nicotine dependence, other tobacco product, uncomplicated: Secondary | ICD-10-CM | POA: Insufficient documentation

## 2024-04-26 DIAGNOSIS — Z950 Presence of cardiac pacemaker: Secondary | ICD-10-CM | POA: Insufficient documentation

## 2024-04-26 DIAGNOSIS — Z1722 Progesterone receptor negative status: Secondary | ICD-10-CM | POA: Insufficient documentation

## 2024-04-26 DIAGNOSIS — M160 Bilateral primary osteoarthritis of hip: Secondary | ICD-10-CM | POA: Insufficient documentation

## 2024-04-26 DIAGNOSIS — E041 Nontoxic single thyroid nodule: Secondary | ICD-10-CM | POA: Insufficient documentation

## 2024-04-26 DIAGNOSIS — I700 Atherosclerosis of aorta: Secondary | ICD-10-CM | POA: Diagnosis not present

## 2024-04-26 DIAGNOSIS — C182 Malignant neoplasm of ascending colon: Secondary | ICD-10-CM

## 2024-04-26 DIAGNOSIS — Z79899 Other long term (current) drug therapy: Secondary | ICD-10-CM | POA: Insufficient documentation

## 2024-04-26 LAB — CBC
HCT: 40.9 % (ref 39.0–52.0)
Hemoglobin: 14.1 g/dL (ref 13.0–17.0)
MCH: 32.6 pg (ref 26.0–34.0)
MCHC: 34.5 g/dL (ref 30.0–36.0)
MCV: 94.5 fL (ref 80.0–100.0)
Platelets: 176 K/uL (ref 150–400)
RBC: 4.33 MIL/uL (ref 4.22–5.81)
RDW: 13 % (ref 11.5–15.5)
WBC: 8.2 K/uL (ref 4.0–10.5)
nRBC: 0 % (ref 0.0–0.2)

## 2024-04-26 LAB — PROTIME-INR
INR: 1.1 (ref 0.8–1.2)
Prothrombin Time: 15.3 s — ABNORMAL HIGH (ref 11.4–15.2)

## 2024-04-26 MED ORDER — FENTANYL CITRATE (PF) 100 MCG/2ML IJ SOLN
INTRAMUSCULAR | Status: AC | PRN
  Administered 2024-04-26: 50 ug via INTRAVENOUS

## 2024-04-26 MED ORDER — SODIUM CHLORIDE 0.9 % IV SOLN: INTRAVENOUS | Status: DC

## 2024-04-26 MED ORDER — MIDAZOLAM HCL (PF) 2 MG/2ML IJ SOLN
INTRAMUSCULAR | Status: AC | PRN
  Administered 2024-04-26: 1 mg via INTRAVENOUS
  Administered 2024-04-26: .5 mg via INTRAVENOUS

## 2024-04-26 MED ORDER — FENTANYL CITRATE (PF) 100 MCG/2ML IJ SOLN
INTRAMUSCULAR | Status: AC
  Filled 2024-04-26: qty 2

## 2024-04-26 MED ORDER — MIDAZOLAM HCL 2 MG/2ML IJ SOLN
INTRAMUSCULAR | Status: AC
  Filled 2024-04-26: qty 2

## 2024-04-26 NOTE — Progress Notes (Signed)
 Discharge instructions reviewed with patient and wife Comer, denies questions or concerns. PT Tolerate PO intake no complaints of N/V. No s/s of complications at biopsy site. Dressing remained clean dry and intact while in short stay. PT was escorted from the unit via wheel chair to personal vehicle.

## 2024-04-26 NOTE — H&P (Addendum)
 Chief Complaint: Patient was seen in consultation today for History of colon cancer- new Lymphadenopathy- for Right retroperitoneal lymph node biopsy at the request of Sherrill,Gary B  Referring Physician(s): Cloretta Arley NOVAK  Supervising Physician: Karalee Beat  Patient Status: [facility] - Out-pt  History of Present Illness: Donald [name] is a 89 y.o. male   FULL Code status per pt History Colon Cancer Follows with Dr Cloretta Restaging CT 04/15/24: IMPRESSION: 1. Progressive pulmonary metastatic disease. 2. New 10 mm low-attenuation lesion in segment 5/6 of the liver is concerning for a metastatic focus. 3. Enlarging central mesenteric mass and adjacent mesenteric lymph nodes. 4. Enlarging right-sided peritoneal implant. 5. Stable surgical changes from a right hemicolectomy. 6. Stable advanced atherosclerotic calcification involving the thoracic and abdominal aorta and branch vessels including the coronary arteries. 7. Stable enlarged prostate gland. 8. Aortic atherosclerosis.  Request made for biopsy for further delineation/diagnosis Right retroperitoneal node biopsy approved by Dr Karalee  Past Medical History:  Diagnosis Date   Abnormal CT scan, stomach 02/2014   Thickening of gastric fundus and cardia.  gastritis on EGD 03/2014   Allergic rhinitis due to pollen    Anemia 12/2020   Arthritis    joints,finger   Arthropathy, unspecified, site unspecified    Atherosclerosis    a. Noted by abdominal CT 02/2014 (h/o normal nuc 2011).   Atrial flutter (HCC) 02/24/2014   s/p ablation 11/15   Blood transfusion without reported diagnosis    Cardiomyopathy (HCC)    tachy mediated - a. TEE (10/15):  EF 30%;  b. Echo after NSR restored (10/15):  mild LVH, EF 55-60%, mild AS, mild AI, mild MR, mild to mod LAE, mild RAE   Cataract    bilateral,removed replaced with implants   CHF (congestive heart failure) (HCC) 01/04/2021   COVID-19 11/2020   Diverticulosis     severe in descending, sigmoid colon.    ED (erectile dysfunction)    Emphysema of lung (HCC)    Esophageal stricture 03/2014   traversable with endoscope. not dilated.    Gastric AVM    GERD (gastroesophageal reflux disease) 03/2014   small HH and gastritis on EGD   GI bleed    Habitual alcohol  use    Hemorrhoids    Hyperlipidemia    Hypertension    Obesity    OSA on CPAP    PAF (paroxysmal atrial fibrillation) (HCC)    Pneumonia 12/2020   mild with covid   Presence of permanent cardiac pacemaker    Prostatitis, unspecified    Pulmonary fibrosis (HCC)    Sinus bradycardia    a. s/p STJ dual chamber PPM   Skin cancer    basal and squamous cell   Sleep apnea    Substance abuse (HCC)    etoh    Past Surgical History:  Procedure Laterality Date   ATRIAL FLUTTER ABLATION N/A 03/19/2014   RFCA of atrial flutter by Dr Fernande   CARDIOVERSION N/A 02/24/2014   Procedure: CARDIOVERSION;  Surgeon: Redell GORMAN Shallow, MD;  Location: [facility] ENDOSCOPY;  Service: Cardiovascular;  Laterality: N/A;   CARDIOVERSION Right 09/10/2014   Procedure: CARDIOVERSION;  Surgeon: Elspeth JAYSON Fernande, MD;  Location: [facility] CATH LAB;  Service: Cardiovascular;  Laterality: Right;   CARDIOVERSION N/A 01/22/2018   Procedure: CARDIOVERSION;  Surgeon: Okey Vina GAILS, MD;  Location: [facility] ENDOSCOPY;  Service: Cardiovascular;  Laterality: N/A;   COLECTOMY  03/24/2021   COLONOSCOPY N/A 04/23/2015   Procedure: COLONOSCOPY;  Surgeon: Gwendlyn ONEIDA Buddy,  MD;  Location: [facility] ENDOSCOPY;  Service: Endoscopy;  Laterality: N/A;   COLONOSCOPY     ELBOW SURGERY Right    SCREW PLACED   ENTEROSCOPY N/A 06/12/2015   Procedure: ENTEROSCOPY;  Surgeon: Gwendlyn ONEIDA Buddy, MD;  Location: [facility] ENDOSCOPY;  Service: Endoscopy;  Laterality: N/A;   EP IMPLANTABLE DEVICE N/A 02/15/2016   Procedure: Pacemaker Implant;  Surgeon: Danelle LELON Birmingham, MD;  Location: [facility] INVASIVE CV LAB;  Service: Cardiovascular;  Laterality: N/A;   ESOPHAGOGASTRODUODENOSCOPY N/A  04/09/2014   Procedure: ESOPHAGOGASTRODUODENOSCOPY (EGD);  Surgeon: Lamar JONETTA Aho, MD;  Location: [facility] ENDOSCOPY;  Service: Endoscopy;  Laterality: N/A;   ESOPHAGOGASTRODUODENOSCOPY N/A 04/23/2015   Procedure: ESOPHAGOGASTRODUODENOSCOPY (EGD);  Surgeon: Gwendlyn ONEIDA Buddy, MD;  Location: [facility] ENDOSCOPY;  Service: Endoscopy;  Laterality: N/A;   INGUINAL HERNIA REPAIR     right   INGUINAL HERNIA REPAIR     left   LEFT HEART CATH AND CORONARY ANGIOGRAPHY N/A 03/16/2017   Procedure: LEFT HEART CATH AND CORONARY ANGIOGRAPHY;  Surgeon: Verlin Lonni JONETTA, MD;  Location: [facility] INVASIVE CV LAB;  Service: Cardiovascular;  Laterality: N/A;   ORIF fracture of the elbow  1992   SKIN CANCER EXCISION  05/21/2012   Squamous cell ca   TEE WITHOUT CARDIOVERSION N/A 02/24/2014   Procedure: TRANSESOPHAGEAL ECHOCARDIOGRAM (TEE);  Surgeon: Redell GORMAN Shallow, MD;  Location: [facility] ENDOSCOPY;  Service: Cardiovascular;  Laterality: N/A;   TONSILLECTOMY     AGE 34   UPPER GASTROINTESTINAL ENDOSCOPY      Allergies: Patient has no allergy information on record.  Medications: Prior to Admission medications  Medication Sig Start Date End Date Taking? Authorizing Provider  albuterol  (VENTOLIN  HFA) 108 (90 Base) MCG/ACT inhaler INHALE TWO PUFFS INTO THE LUNGS EVERY 6 HOURS AS NEEDED FOR WHEEZEING OR SHORTNESS OF BREATH 01/30/23  Yes Charlanne Fredia CROME, MD  colestipol  (COLESTID ) 5 g granules Take 5 g by mouth daily. 03/05/24  Yes Darlean Maus, NP  diltiazem  (CARDIZEM  CD) 120 MG 24 hr capsule Take 1 capsule (120 mg total) by mouth 2 (two) times daily, at breakfast and at bedtime. 11/06/23  Yes Fernande Elspeth BROCKS, MD  dofetilide  (TIKOSYN ) 500 MCG capsule Take 1 capsule (500 mcg total) by mouth 2 (two) times daily. 10/31/23  Yes Fernande Elspeth BROCKS, MD  furosemide  (LASIX ) 20 MG tablet Take 1 tablet (20 mg total) by mouth 2 (two) times daily. 03/27/24  Yes Almetta Donnice LABOR, MD  magnesium  oxide (MAG-OX) 400 MG tablet Take 1 tablet (400  mg total) by mouth at bedtime. 03/11/24  Yes Charlanne Fredia CROME, MD  metoprolol  tartrate (LOPRESSOR ) 25 MG tablet Take 1 tablet (25 mg total) by mouth 2 (two) times daily. 11/06/23  Yes Fernande Elspeth BROCKS, MD  Pancrelipase , Lip-Prot-Amyl, (ZENPEP ) [phone] units CPEP Take 2 capsules by mouth 3 (three) times daily with meals. Take 1 capsule by mouth with each snack (maximum 2 snacks daily) 03/26/24  Yes Pyrtle, Gordy HERO, MD  rosuvastatin  (CRESTOR ) 10 MG tablet Take 1 tablet (10 mg total) by mouth daily. 11/13/23  Yes Gupta, Anjali L, MD  sildenafil (REVATIO) 20 MG tablet Take 20 mg by mouth daily as needed (erectile dysfunction). 02/13/22  Yes [provider]  traMADol  (ULTRAM ) 50 MG tablet Take 1 tablet (50 mg total) by mouth 3 (three) times daily as needed for pain. 03/14/24  Yes   apixaban  (ELIQUIS ) 5 MG TABS tablet Take 1 tablet (5 mg total) by mouth 2 (two) times daily. 11/06/23   Fernande Elspeth  C, MD  carboxymethylcellulose (REFRESH PLUS) 0.5 % SOLN Place 1 drop into both eyes daily as needed (dry eyes).    [provider]  Pedialyte (PEDIALYTE) SOLN Take 178 mLs by mouth daily. 6 oz Patient not taking: Reported on 04/17/2024    [provider]  sodium chloride  (OCEAN) 0.65 % SOLN nasal spray Place 1 spray into both nostrils as needed for congestion.    [provider]     Family History  Problem Relation Age of Onset   Cancer Mother        ovarian   Other Father        renal disease- grief over loss of spouse   Cancer Brother        prostate- died of hematologic disorder 2nd to chemo   Pulmonary fibrosis Brother    Diabetes Neg Hx    Coronary artery disease Neg Hx    Colon cancer Neg Hx    Stomach cancer Neg Hx    Rectal cancer Neg Hx    Colon polyps Neg Hx    Crohn's disease Neg Hx    Esophageal cancer Neg Hx    Ulcerative colitis Neg Hx     Social History   Socioeconomic History   Marital status: Married    Spouse name: Not on file   Number of  children: 3   Years of education: 16   Highest education level: Not on file  Occupational History   Occupation: comptroller    Employer: RETIRED  Tobacco Use   Smoking status: Former    Current packs/day: 0.00    Types: Cigarettes, Cigars    Quit date: 09/18/1968    Years since quitting: 55.6    Passive exposure: Past   Smokeless tobacco: Former    Types: Chew    Quit date: 2005  Vaping Use   Vaping status: Never Used  Substance and Sexual Activity   Alcohol  use: Yes    Alcohol /week: 14.0 standard drinks of alcohol     Types: 14 Standard drinks or equivalent per week    Comment: 2 drinks nightly 03/21/22   Drug use: No   Sexual activity: Not Currently  Other Topics Concern   Not on file  Social History Narrative   HSG, Erma - patent attorney. married 1961. 2 sons- '64, '63 , I daughter- '66, 4 grandchildren. work: Psychologist, Forensic, retired but still consults. Golfer, gardner, volunteer. ACP - has Regulatory Affairs Officer; DNR; DNI; no long term HD, no heroic or futile, measures.      No new stressors/       Consumption of caffeine-Coffee      Exercise- 30 mins swimming daily   Social Drivers of Health   Tobacco Use: Medium Risk (04/26/2024)   Patient History    Smoking Tobacco Use: Former    Smokeless Tobacco Use: Former    Passive Exposure: Past  Programmer, Applications: Not on Ship Broker Insecurity: Not on file  Transportation Needs: Not on file  Physical Activity: Not on file  Stress: Not on file  Social Connections: Not on file  Depression (PHQ2-9): Low Risk (04/17/2024)   Depression (PHQ2-9)    PHQ-2 Score: 0  Alcohol  Screen: Low Risk (11/28/2022)   Alcohol  Screen    Last Alcohol  Screening Score (AUDIT): 4  Housing: Not on file  Utilities: Not on file  Health Literacy: Not on file    Review of Systems: A 12 point ROS discussed and pertinent positives are indicated  in the HPI above.  All other systems are negative.  Review of Systems   Constitutional:  Negative for activity change, fatigue and fever.  Gastrointestinal:  Positive for diarrhea and nausea. Negative for abdominal pain.  Neurological:  Negative for weakness.  Psychiatric/Behavioral:  Negative for confusion and decreased concentration.     Vital Signs: There were no vitals taken for this visit.  Advance Care Plan: The advanced care plan/surrogate decision maker was discussed at the time of visit and documented in the medical record.    Physical Exam Vitals reviewed.  HENT:     Mouth/Throat:     Mouth: Mucous membranes are moist.  Cardiovascular:     Rate and Rhythm: Normal rate and regular rhythm.     Heart sounds: Normal heart sounds.  Pulmonary:     Effort: Pulmonary effort is normal.     Breath sounds: Normal breath sounds. No wheezing.  Abdominal:     Palpations: Abdomen is soft.     Tenderness: There is no abdominal tenderness.  Musculoskeletal:        General: Normal range of motion.  Skin:    General: Skin is warm and dry.  Neurological:     Mental Status: He is alert and oriented to person, place, and time.  Psychiatric:        Behavior: Behavior normal.     Imaging: CT CHEST ABDOMEN PELVIS W CONTRAST Result Date: 04/16/2024 CLINICAL DATA:  Restaging metastatic colon cancer. * Tracking Code: BO * EXAM: CT CHEST, ABDOMEN, AND PELVIS WITH CONTRAST TECHNIQUE: Multidetector CT imaging of the chest, abdomen and pelvis was performed following the standard protocol during bolus administration of intravenous contrast. RADIATION DOSE REDUCTION: This exam was performed according to the departmental dose-optimization program which includes automated exposure control, adjustment of the mA and/or kV according to patient size and/or use of iterative reconstruction technique. CONTRAST:  100mL OMNIPAQUE  IOHEXOL  300 MG/ML  SOLN COMPARISON:  Multiple previous imaging studies. The most recent CT scan is 12/07/1998 FINDINGS: CT CHEST FINDINGS Cardiovascular:  The heart is normal in size. No pericardial effusion. The aorta is normal in caliber. No dissection. Stable atherosclerotic calcifications. Stable calcifications at the aortic valve. Stable coronary artery calcifications. The pacer wires are in good position, unchanged. Mediastinum/Nodes: Stable borderline enlarged mediastinal lymph nodes. No new or progressive findings. The esophagus is unremarkable. Small hiatal hernia is stable. Lungs/Pleura: Numerous metastatic pulmonary nodules. Anterior right upper lobe pulmonary nodule on image 52/3 measures 8 mm and previously measured 4 mm. Right middle lobe pulmonary nodule on image 76/3 measures 10 mm and previously measured 4 mm. 9 mm left lower lobe pulmonary nodule on image 116/3 previously measured 4 mm. 6 mm lingular nodule on image 100/3 previously measured 3 mm. Several new small scattered pulmonary nodules. Stable underlying emphysematous changes and pulmonary scarring. No pleural effusions or pleural nodules. Musculoskeletal: No chest wall mass, supraclavicular or axillary adenopathy. Stable 14 mm right thyroid  nodule. No worrisome bone lesions. CT ABDOMEN PELVIS FINDINGS Hepatobiliary: Stable scattered hepatic cysts. New 10 mm low-attenuation lesion in segment 5/6 on image 61/2 is concerning for a metastatic focus. 7 mm lesion in segment 6 on image 64/2 is indeterminate but stable. The gallbladder is unremarkable.  No common bile duct dilatation. Pancreas: No mass, inflammation or ductal dilatation. Spleen: Normal size.  No focal lesions. Adrenals/Urinary Tract: The adrenal glands are normal. Stable simple renal cysts. No worrisome renal lesions or hydronephrosis. The bladder is unremarkable. Stomach/Bowel: The stomach, duodenum, small bowel  and colon are unremarkable. No mass lesions or obstructive findings. Stable surgical changes from a right hemicolectomy. Stable colonic diverticulosis. Vascular/Lymphatic: Stable advanced atherosclerotic calcification  involving the aorta and branch vessels but no aneurysm or dissection. The major venous structures are patent. The ill-defined necrotic appearing central mesenteric mass measures approximately 10.5 x 5.6 cm. This previously measured 6.0 x 5.2 cm. Adjacent mesenteric nodes are again noted. The largest node measures 18 mm on image 80/2. Mesenteric node on image 69/2 measures 15 mm and previously measured 11 mm. Right-sided peritoneal implant on image 86/2 measures 2.7 x 2.3 cm and previously measured 2.2 x 1.6 cm. Reproductive: Stable enlarged prostate gland. The seminal vesicles are unremarkable. Other: No ascites or abdominal wall hernia. Musculoskeletal: No worrisome bone lesions. Stable degenerative changes involving the spine and hips. IMPRESSION: 1. Progressive pulmonary metastatic disease. 2. New 10 mm low-attenuation lesion in segment 5/6 of the liver is concerning for a metastatic focus. 3. Enlarging central mesenteric mass and adjacent mesenteric lymph nodes. 4. Enlarging right-sided peritoneal implant. 5. Stable surgical changes from a right hemicolectomy. 6. Stable advanced atherosclerotic calcification involving the thoracic and abdominal aorta and branch vessels including the coronary arteries. 7. Stable enlarged prostate gland. 8. Aortic atherosclerosis. Aortic Atherosclerosis (ICD10-I70.0). Electronically Signed   By: MYRTIS Stammer M.D.   On: 04/16/2024 15:14    Labs:  CBC: No results for input(s): WBC, HGB, HCT, PLT in the last 8760 hours.  COAGS: No results for input(s): INR, APTT in the last 8760 hours.  BMP: Recent Labs    05/04/23 0000 08/01/23 1140 12/07/23 1100 04/15/24 1018  NA 140 138 139 141  K 4.9 4.7 4.8 5.2*  CL 104 104 103 106  CO2 23* 29 26 27   GLUCOSE  --  94 97 96  BUN 25* 23 22 21   CALCIUM  9.4 9.7 10.0 10.0  CREATININE 1.1 1.11 1.15 1.04  GFRNONAA  --  >60 >60 >60    LIVER FUNCTION TESTS: Recent Labs    05/04/23 0000  AST 26  ALT 24   ALKPHOS 78  ALBUMIN 4.3    TUMOR MARKERS: Recent Labs    12/07/23 1100 04/15/24 1018  CEA 1.91 2.10    Assessment and Plan:  Scheduled for Right retroperitoneal node biopsy Risks and benefits of right retroperitoneal node biopsy was discussed with the patient and/or patient's family including, but not limited to bleeding, infection, damage to adjacent structures or low yield requiring additional tests.  All of the questions were answered and there is agreement to proceed.  Consent signed and in chart.  Thank you for this interesting consult.  I greatly enjoyed meeting KATE SWEETMAN and look forward to participating in their care.  A copy of this report was sent to the requesting provider on this date.  Electronically Signed: Sharlet DELENA Candle, PA-C 04/26/2024, 8:27 AM   I spent a total of  30 Minutes   in face to face in clinical consultation, greater than 50% of which was counseling/coordinating care for Right retroperitoneal node biopsy

## 2024-04-26 NOTE — Procedures (Signed)
 Interventional Radiology Procedure Note  Procedure: CT guided biopsy of RIGHT retroperitoneal implant  Complications: None  Estimated Blood Loss: None  Recommendations: - DC home   Signed,  Wilkie LOIS Lent, MD

## 2024-04-27 ENCOUNTER — Other Ambulatory Visit: Payer: Self-pay | Admitting: Internal Medicine

## 2024-04-27 ENCOUNTER — Other Ambulatory Visit: Payer: Self-pay

## 2024-04-27 ENCOUNTER — Other Ambulatory Visit (HOSPITAL_BASED_OUTPATIENT_CLINIC_OR_DEPARTMENT_OTHER): Payer: Self-pay

## 2024-04-29 ENCOUNTER — Other Ambulatory Visit (HOSPITAL_BASED_OUTPATIENT_CLINIC_OR_DEPARTMENT_OTHER): Payer: Self-pay

## 2024-04-29 MED ORDER — ALBUTEROL SULFATE HFA 108 (90 BASE) MCG/ACT IN AERS
INHALATION_SPRAY | RESPIRATORY_TRACT | 3 refills | Status: AC
  Filled 2024-04-29: qty 6.7, 25d supply, fill #0
  Filled 2024-06-17: qty 6.7, 25d supply, fill #1

## 2024-04-30 ENCOUNTER — Telehealth: Payer: Self-pay | Admitting: Student in an Organized Health Care Education/Training Program

## 2024-04-30 ENCOUNTER — Other Ambulatory Visit (HOSPITAL_BASED_OUTPATIENT_CLINIC_OR_DEPARTMENT_OTHER): Payer: Self-pay

## 2024-04-30 ENCOUNTER — Other Ambulatory Visit: Payer: Self-pay | Admitting: *Deleted

## 2024-04-30 ENCOUNTER — Inpatient Hospital Stay: Admitting: Oncology

## 2024-04-30 VITALS — BP 128/75 | HR 69 | Temp 98.0°F | Resp 18 | Ht 68.0 in | Wt 214.6 lb

## 2024-04-30 DIAGNOSIS — C180 Malignant neoplasm of cecum: Secondary | ICD-10-CM | POA: Diagnosis not present

## 2024-04-30 DIAGNOSIS — C182 Malignant neoplasm of ascending colon: Secondary | ICD-10-CM | POA: Diagnosis not present

## 2024-04-30 LAB — SURGICAL PATHOLOGY

## 2024-04-30 MED ORDER — DOFETILIDE 500 MCG PO CAPS
500.0000 ug | ORAL_CAPSULE | Freq: Two times a day (BID) | ORAL | 1 refills | Status: DC
  Filled 2024-04-30: qty 180, 90d supply, fill #0

## 2024-04-30 NOTE — Progress Notes (Signed)
 START ON PATHWAY REGIMEN - Colorectal     A cycle is every 14 days:     Oxaliplatin      Leucovorin      Fluorouracil      Fluorouracil   **Always confirm dose/schedule in your pharmacy ordering system**  Patient Characteristics: Distant Metastases, Nonsurgical Candidate, KRAS/NRAS Wild-Type (BRAF V600 Wild-Type/Unknown), Standard Cytotoxic Therapy, First Line Standard Cytotoxic Therapy, Right-sided Tumor, PS = 0,1, Bevacizumab Ineligible Therapeutic Status: Distant Metastases Tumor Location: Colon BRAF Mutation Status: Wild-Type (no mutation) KRAS/NRAS Mutation Status: Wild-Type (no mutation) Microsatellite/Mismatch Repair Status: MSS/pMMR Preferred Therapy Approach: Standard Cytotoxic Therapy Standard Cytotoxic Line of Therapy: First Line Standard Cytotoxic Therapy Primary Tumor Location: Right-sided Tumor ECOG Performance Status: 1 Bevacizumab Eligibility: Ineligible Intent of Therapy: Non-Curative / Palliative Intent, Discussed with Patient

## 2024-04-30 NOTE — Telephone Encounter (Signed)
°*  STAT* If patient is at the pharmacy, call can be transferred to refill team.   1. Which medications need to be refilled? (please list name of each medication and dose if known)   dofetilide  (TIKOSYN ) 500 MCG capsule   2. Would you like to learn more about the convenience, safety, & potential cost savings by using the [facility]?   3. Are you open to using the [facility] (Type [facility]. ).  4. Which pharmacy/location (including street and city if local pharmacy) is medication to be sent to?  [facility]   5. Do they need a 30 day or 90 day supply?   90 day  Patient stated he still has some medication.   Patient has appointment with Dr. Almetta on 1/30.

## 2024-04-30 NOTE — Progress Notes (Signed)
 [facility] OFFICE PROGRESS NOTE   Diagnosis: Colon cancer  INTERVAL HISTORY:   [name] returns as scheduled.  He underwent a CT-guided biopsy of a right retroperitoneal mass on 04/26/2024.  He tolerated the procedure well.  Continues to have back and low abdominal discomfort.  He takes tramadol  once or twice daily.  No other complaint.  Objective:  Vital signs in last 24 hours:  Blood pressure 128/75, pulse 69, temperature 98 F (36.7 C), temperature source Temporal, resp. rate 18, height 5' 8 (1.727 m), weight 214 lb 9.6 oz (97.3 kg), SpO2 96%.    Resp: Lungs clear bilaterally Cardio: Regular rate and rhythm, high-pitched systolic murmur GI: No hepatosplenomegaly, no mass Vascular: No leg edema  Lab Results:  Lab Results  Component Value Date   WBC 8.2 04/26/2024   HGB 14.1 04/26/2024   HCT 40.9 04/26/2024   MCV 94.5 04/26/2024   PLT 176 04/26/2024   NEUTROABS 5.8 09/20/2021    CMP  Lab Results  Component Value Date   NA 141 04/15/2024   K 5.2 (H) 04/15/2024   CL 106 04/15/2024   CO2 27 04/15/2024   GLUCOSE 96 04/15/2024   BUN 21 04/15/2024   CREATININE 1.04 04/15/2024   CALCIUM  10.0 04/15/2024   PROT 7.1 09/20/2021   ALBUMIN 4.3 05/04/2023   AST 26 05/04/2023   ALT 24 05/04/2023   ALKPHOS 78 05/04/2023   BILITOT 1.0 09/20/2021   GFRNONAA >60 04/15/2024   GFRAA >60 02/25/2019    Lab Results  Component Value Date   CEA 2.10 04/15/2024    Lab Results  Component Value Date   INR 1.1 04/26/2024   LABPROT 15.3 (H) 04/26/2024    Imaging:  No results found.  Medications: I have reviewed the patient's current medications.   Assessment/Plan: Colon cancer-stage III (T3N1), cecum mass biopsy 02/03/2021-adenocarcinoma CTs 02/16/2021-stable interstitial lung disease, mild COPD, cardiomegaly, CAD, multiple liver cysts with a few subcentimeter too small to characterize liver lesions, circumferential wall thickening at the ileocecal valve,  right lower quadrant ileocolic lymph node measures 1.1 cm 03/24/2021, robotic assisted laparoscopic right colectomy-T3, 1/8 lymph nodes, 1 tumor deposit, lymphovascular invasion, mismatch repair protein expression intact, MSS: Foundation 1-HRD signature negative, MSS, tumor mutation burden 1, K-ras G12V Cycle 1 adjuvant Xeloda  04/21/2021 Cycle 2 adjuvant Xeloda  05/12/2021 Cycle 3 adjuvant Xeloda  06/07/2021 (5-day delay in start date due to foot discomfort, travel plans) Cycle 4 adjuvant Xeloda  06/29/2021, Xeloda  dose reduced to 1000 mg twice daily for 14 days Cycle 5 adjuvant Xeloda  07/19/2021 Cycle 6 adjuvant Xeloda  08/09/2021 Cycle 7 adjuvant Xeloda  08/30/2021 Cycle 8 adjuvant Xeloda  09/20/2021 CTs 02/14/2022-no evidence of recurrent disease CT abdomen/pelvis 03/14/2023- right central mesentery and right iliac fossa implants PET 02/01/2023: Hypermetabolic right small bowel mesenteric mass, mild focal hypermetabolism in the distal esophagus-nonspecific.  The right pelvic nodule is hypermetabolic. CT abdomen/pelvis 08/01/2023: Mild increase in size of right abdominal mesenteric and right lower quadrant peritoneal metastases, no new site of metastatic disease CTs 12/07/2023:  enlargement of central mesenteric mass, able to centimeter mass adjacent to the right psoas, new pulmonary nodules measuring up to 4 mm CTs 04/15/2024: New and enlarging pulmonary nodules, enlargement of central mesenteric mass and right sided peritoneal implant, new 10 mm low-attenuation segment 5/6 liver lesion, enlargement of lymph nodes adjacent to the mesenteric mass 04/26/2024 CT biopsy of right retroperitoneal mass: Adenocarcinoma, CK20 and CDX2 positive consistent with a colonic primary CAD Interstitial lung disease Iron  deficiency anemia secondary to #1  Multiple colon polyps on the colonoscopy 02/03/2021-microscopic fragments of adenocarcinoma involving biopsies of transverse and ascending colon polyps felt to be contaminant from  the cecum biopsy 2 tubular adenomas on the right colectomy specimen 03/24/2021 OSA Sick sinus syndrome-pacemaker Atrial fibrillation-maintained on apixaban  Chronic diastolic CHF History of multiple skin cancers       Disposition: [name] has metastatic colon cancer.  The right retroperitoneal mass biopsy 04/26/2024 confirmed metastatic adenocarcinoma from a colonic primary.  I discussed treatment options Mr. Lincks and his wife.  He understands no therapy will be curative.  We discussed comfort/supportive care versus a trial of systemic therapy.  He understands the goals of systemic therapy are to palliate symptoms and potentially extend survival.  The low back/abdominal pain could be due to carcinomatosis.  He will continue tramadol  as needed for pain.  I recommend FOLFOX.  We reviewed potential toxicities associated with the FOLFOX regimen including the chance of nausea/vomiting, mucositis, diarrhea, alopecia, hematologic toxicity, infection, and bleeding.  We discussed the sun sensitivity, rash, hyperpigmentation, hand/foot syndrome, and cardiac toxicity associated with 5-fluorouracil.  We reviewed the allergic reaction and various types of neuropathy seen with oxaliplatin.  He reports mild numbness at the soles of the feet at present.  He may be at increased risk of developing neuropathy.  He agrees to proceed.  He will attend a chemotherapy teaching class.  He will be referred for restaging CTs after 5 cycles of FOLFOX.  Mr. Clasby will be referred for Port-A-Cath placement this week.  He will return for cycle 1 FOLFOX 05/14/2024.  A treatment plan was entered today.  We discussed CODE STATUS.  Mr. Fabela has made a decision to be placed on a no CODE BLUE status.  Arley Hof, MD  04/30/2024  4:05 PM

## 2024-04-30 NOTE — Telephone Encounter (Signed)
 Refill sent

## 2024-05-01 ENCOUNTER — Other Ambulatory Visit: Payer: Self-pay | Admitting: Diagnostic Radiology

## 2024-05-01 ENCOUNTER — Other Ambulatory Visit: Payer: Self-pay

## 2024-05-02 ENCOUNTER — Ambulatory Visit (HOSPITAL_COMMUNITY)
Admission: RE | Admit: 2024-05-02 | Discharge: 2024-05-02 | Disposition: A | Discharge status: Home / Self Care | Source: Ambulatory Visit | Attending: Oncology | Admitting: Oncology

## 2024-05-02 ENCOUNTER — Other Ambulatory Visit: Payer: Self-pay

## 2024-05-02 ENCOUNTER — Other Ambulatory Visit: Payer: Self-pay | Admitting: Diagnostic Radiology

## 2024-05-02 DIAGNOSIS — Z87891 Personal history of nicotine dependence: Secondary | ICD-10-CM | POA: Insufficient documentation

## 2024-05-02 DIAGNOSIS — I509 Heart failure, unspecified: Secondary | ICD-10-CM | POA: Insufficient documentation

## 2024-05-02 DIAGNOSIS — J849 Interstitial pulmonary disease, unspecified: Secondary | ICD-10-CM | POA: Insufficient documentation

## 2024-05-02 DIAGNOSIS — I110 Hypertensive heart disease with heart failure: Secondary | ICD-10-CM | POA: Insufficient documentation

## 2024-05-02 DIAGNOSIS — I480 Paroxysmal atrial fibrillation: Secondary | ICD-10-CM | POA: Insufficient documentation

## 2024-05-02 DIAGNOSIS — Z950 Presence of cardiac pacemaker: Secondary | ICD-10-CM | POA: Diagnosis not present

## 2024-05-02 DIAGNOSIS — I495 Sick sinus syndrome: Secondary | ICD-10-CM | POA: Insufficient documentation

## 2024-05-02 DIAGNOSIS — Z79899 Other long term (current) drug therapy: Secondary | ICD-10-CM | POA: Insufficient documentation

## 2024-05-02 DIAGNOSIS — C189 Malignant neoplasm of colon, unspecified: Secondary | ICD-10-CM | POA: Insufficient documentation

## 2024-05-02 DIAGNOSIS — Z7901 Long term (current) use of anticoagulants: Secondary | ICD-10-CM | POA: Insufficient documentation

## 2024-05-02 DIAGNOSIS — Z85828 Personal history of other malignant neoplasm of skin: Secondary | ICD-10-CM | POA: Diagnosis not present

## 2024-05-02 DIAGNOSIS — C182 Malignant neoplasm of ascending colon: Secondary | ICD-10-CM

## 2024-05-02 HISTORY — PX: IR IMAGING GUIDED PORT INSERTION: IMG5740

## 2024-05-02 MED ORDER — LIDOCAINE-EPINEPHRINE 1 %-1:100000 IJ SOLN
INTRAMUSCULAR | Status: AC
  Filled 2024-05-02: qty 1

## 2024-05-02 MED ORDER — SODIUM CHLORIDE 0.9 % IV SOLN: INTRAVENOUS | Status: DC

## 2024-05-02 MED ORDER — MIDAZOLAM HCL 2 MG/2ML IJ SOLN
INTRAMUSCULAR | Status: AC
  Filled 2024-05-02: qty 2

## 2024-05-02 MED ORDER — FENTANYL CITRATE (PF) 100 MCG/2ML IJ SOLN
INTRAMUSCULAR | Status: AC
  Filled 2024-05-02: qty 2

## 2024-05-02 MED ORDER — LIDOCAINE-EPINEPHRINE 1 %-1:100000 IJ SOLN
20.0000 mL | Freq: Once | INTRAMUSCULAR | Status: AC
  Administered 2024-05-02: 12:00:00 15 mL

## 2024-05-02 MED ORDER — HEPARIN SOD (PORK) LOCK FLUSH 100 UNIT/ML IV SOLN
INTRAVENOUS | Status: AC
  Filled 2024-05-02: qty 5

## 2024-05-02 MED ORDER — FENTANYL CITRATE (PF) 100 MCG/2ML IJ SOLN
INTRAMUSCULAR | Status: AC | PRN
  Administered 2024-05-02 (×2): 50 ug via INTRAVENOUS

## 2024-05-02 MED ORDER — MIDAZOLAM HCL (PF) 2 MG/2ML IJ SOLN
INTRAMUSCULAR | Status: AC | PRN
  Administered 2024-05-02 (×2): 1 mg via INTRAVENOUS

## 2024-05-02 NOTE — Procedures (Signed)
Interventional Radiology Procedure: ? ? ?Indications: Metastatic colon cancer ? ?Procedure: Port placement ? ?Findings: Right jugular port, tip at SVC/RA junction ? ?Complications: None ?    ?EBL: Minimal, less than 10 ml ? ?Plan: Discharge in one hour.  Keep port site and incisions dry for at least 24 hours.   ? ? ?Rielynn Trulson R. Trace Cederberg, MD  ?Pager: [phone] ? ?  ?

## 2024-05-02 NOTE — H&P (Signed)
 Chief Complaint: Patient was seen in consultation today for metastatic colon cancer  Referring Physician(s): Cloretta Kuba B  Supervising Physician: Philip Cornet  Patient Status: [facility] - Out-pt  History of Present Illness: Donald [name] is an 89 y.o. male with a medical history significant for interstitial lung disease, sick sinus syndrome with pacemaker, atrial fibrillation on apixaban , CHF, skin cancer and metastatic colon cancer initially diagnosed in 2022. He is s/p right colectomy 03/24/21 followed by adjuvant oral chemotherapy until 09/20/2021. Surveillance imaging later that year showed no evidence of recurrent disease but imaging 03/14/23 showed signs of metastatic disease. He was asymptomatic and the decision was made for conservative management with observation. Additional imaging in 2025 showed mild interval disease progression including a central mesenteric mass. The decision was then made to obtain tissue sample to confirm metastatic colon cancer diagnosis and the patient underwent biopsy of a right retroperitoneal implant in IR 04/26/24. Pathology returned positive for metastatic adenocarcinoma and the patient is being prepared for palliative chemotherapy. He will require durable venous access.   Interventional Radiology has been asked to evaluate this patient for an image-guided port-a-catheter placement to facilitate his treatment goals.   Past Medical History:  Diagnosis Date   Abnormal CT scan, stomach 02/2014   Thickening of gastric fundus and cardia.  gastritis on EGD 03/2014   Allergic rhinitis due to pollen    Anemia 12/2020   Arthritis    joints,finger   Arthropathy, unspecified, site unspecified    Atherosclerosis    a. Noted by abdominal CT 02/2014 (h/o normal nuc 2011).   Atrial flutter (HCC) 02/24/2014   s/p ablation 11/15   Blood transfusion without reported diagnosis    Cardiomyopathy (HCC)    tachy mediated - a. TEE (10/15):  EF 30%;  b. Echo after NSR  restored (10/15):  mild LVH, EF 55-60%, mild AS, mild AI, mild MR, mild to mod LAE, mild RAE   Cataract    bilateral,removed replaced with implants   CHF (congestive heart failure) (HCC) 01/04/2021   COVID-19 11/2020   Diverticulosis    severe in descending, sigmoid colon.    ED (erectile dysfunction)    Emphysema of lung (HCC)    Esophageal stricture 03/2014   traversable with endoscope. not dilated.    Gastric AVM    GERD (gastroesophageal reflux disease) 03/2014   small HH and gastritis on EGD   GI bleed    Habitual alcohol  use    Hemorrhoids    Hyperlipidemia    Hypertension    Obesity    OSA on CPAP    PAF (paroxysmal atrial fibrillation) (HCC)    Pneumonia 12/2020   mild with covid   Presence of permanent cardiac pacemaker    Prostatitis, unspecified    Pulmonary fibrosis (HCC)    Sinus bradycardia    a. s/p STJ dual chamber PPM   Skin cancer    basal and squamous cell   Sleep apnea    Substance abuse (HCC)    etoh    Past Surgical History:  Procedure Laterality Date   ATRIAL FLUTTER ABLATION N/A 03/19/2014   RFCA of atrial flutter by Dr Fernande   CARDIOVERSION N/A 02/24/2014   Procedure: CARDIOVERSION;  Surgeon: Redell GORMAN Shallow, MD;  Location: [facility] ENDOSCOPY;  Service: Cardiovascular;  Laterality: N/A;   CARDIOVERSION Right 09/10/2014   Procedure: CARDIOVERSION;  Surgeon: Elspeth JAYSON Fernande, MD;  Location: [facility] CATH LAB;  Service: Cardiovascular;  Laterality: Right;   CARDIOVERSION N/A 01/22/2018  Procedure: CARDIOVERSION;  Surgeon: Okey Vina GAILS, MD;  Location: [facility] ENDOSCOPY;  Service: Cardiovascular;  Laterality: N/A;   COLECTOMY  03/24/2021   COLONOSCOPY N/A 04/23/2015   Procedure: COLONOSCOPY;  Surgeon: Gwendlyn ONEIDA Buddy, MD;  Location: [facility] ENDOSCOPY;  Service: Endoscopy;  Laterality: N/A;   COLONOSCOPY     ELBOW SURGERY Right    SCREW PLACED   ENTEROSCOPY N/A 06/12/2015   Procedure: ENTEROSCOPY;  Surgeon: Gwendlyn ONEIDA Buddy, MD;  Location: [facility] ENDOSCOPY;  Service:  Endoscopy;  Laterality: N/A;   EP IMPLANTABLE DEVICE N/A 02/15/2016   Procedure: Pacemaker Implant;  Surgeon: Danelle LELON Birmingham, MD;  Location: [facility] INVASIVE CV LAB;  Service: Cardiovascular;  Laterality: N/A;   ESOPHAGOGASTRODUODENOSCOPY N/A 04/09/2014   Procedure: ESOPHAGOGASTRODUODENOSCOPY (EGD);  Surgeon: Lamar JONETTA Aho, MD;  Location: [facility] ENDOSCOPY;  Service: Endoscopy;  Laterality: N/A;   ESOPHAGOGASTRODUODENOSCOPY N/A 04/23/2015   Procedure: ESOPHAGOGASTRODUODENOSCOPY (EGD);  Surgeon: Gwendlyn ONEIDA Buddy, MD;  Location: [facility] ENDOSCOPY;  Service: Endoscopy;  Laterality: N/A;   INGUINAL HERNIA REPAIR     right   INGUINAL HERNIA REPAIR     left   LEFT HEART CATH AND CORONARY ANGIOGRAPHY N/A 03/16/2017   Procedure: LEFT HEART CATH AND CORONARY ANGIOGRAPHY;  Surgeon: Verlin Lonni JONETTA, MD;  Location: [facility] INVASIVE CV LAB;  Service: Cardiovascular;  Laterality: N/A;   ORIF fracture of the elbow  1992   SKIN CANCER EXCISION  05/21/2012   Squamous cell ca   TEE WITHOUT CARDIOVERSION N/A 02/24/2014   Procedure: TRANSESOPHAGEAL ECHOCARDIOGRAM (TEE);  Surgeon: Redell GORMAN Shallow, MD;  Location: [facility] ENDOSCOPY;  Service: Cardiovascular;  Laterality: N/A;   TONSILLECTOMY     AGE 14   UPPER GASTROINTESTINAL ENDOSCOPY      Allergies: Patient has no known allergies.  Medications: Prior to Admission medications  Medication Sig Start Date End Date Taking? Authorizing Provider  albuterol  (VENTOLIN  HFA) 108 (90 Base) MCG/ACT inhaler INHALE TWO PUFFS INTO THE LUNGS EVERY 6 HOURS AS NEEDED FOR WHEEZEING OR SHORTNESS OF BREATH 04/29/24  Yes Charlanne Fredia CROME, MD  carboxymethylcellulose (REFRESH PLUS) 0.5 % SOLN Place 1 drop into both eyes daily as needed (dry eyes).   Yes [provider]  colestipol  (COLESTID ) 5 g granules Take 5 g by mouth daily. 03/05/24  Yes Darlean Maus, NP  diltiazem  (CARDIZEM  CD) 120 MG 24 hr capsule Take 1 capsule (120 mg total) by mouth 2 (two) times daily, at breakfast and at  bedtime. 11/06/23  Yes Fernande Elspeth BROCKS, MD  dofetilide  (TIKOSYN ) 500 MCG capsule Take 1 capsule (500 mcg total) by mouth 2 (two) times daily. 04/30/24  Yes Almetta Donnice LABOR, MD  furosemide  (LASIX ) 20 MG tablet Take 1 tablet (20 mg total) by mouth 2 (two) times daily. 03/27/24  Yes Almetta Donnice LABOR, MD  magnesium  oxide (MAG-OX) 400 MG tablet Take 1 tablet (400 mg total) by mouth at bedtime. 03/11/24  Yes Charlanne Fredia CROME, MD  metoprolol  tartrate (LOPRESSOR ) 25 MG tablet Take 1 tablet (25 mg total) by mouth 2 (two) times daily. 11/06/23  Yes Fernande Elspeth BROCKS, MD  Pancrelipase , Lip-Prot-Amyl, (ZENPEP ) [id number] units CPEP Take 2 capsules by mouth 3 (three) times daily with meals. Take 1 capsule by mouth with each snack (maximum 2 snacks daily) 03/26/24  Yes Pyrtle, Gordy HERO, MD  Pedialyte (PEDIALYTE) SOLN Take 178 mLs by mouth daily. 6 oz   Yes [provider]  rosuvastatin  (CRESTOR ) 10 MG tablet Take 1 tablet (10 mg total) by mouth daily. 11/13/23  Yes Gupta, Anjali L, MD  sildenafil (REVATIO) 20 MG tablet Take 20 mg by mouth daily as needed (erectile dysfunction). 02/13/22  Yes [provider]  sodium chloride  (OCEAN) 0.65 % SOLN nasal spray Place 1 spray into both nostrils as needed for congestion.   Yes [provider]  apixaban  (ELIQUIS ) 5 MG TABS tablet Take 1 tablet (5 mg total) by mouth 2 (two) times daily. 11/06/23   Fernande Elspeth BROCKS, MD  traMADol  (ULTRAM ) 50 MG tablet Take 1 tablet (50 mg total) by mouth 3 (three) times daily as needed for pain. 03/14/24        Family History  Problem Relation Age of Onset   Cancer Mother        ovarian   Other Father        renal disease- grief over loss of spouse   Cancer Brother        prostate- died of hematologic disorder 2nd to chemo   Pulmonary fibrosis Brother    Diabetes Neg Hx    Coronary artery disease Neg Hx    Colon cancer Neg Hx    Stomach cancer Neg Hx    Rectal cancer Neg Hx    Colon polyps Neg Hx     Crohn's disease Neg Hx    Esophageal cancer Neg Hx    Ulcerative colitis Neg Hx     Social History   Socioeconomic History   Marital status: Married    Spouse name: Not on file   Number of children: 3   Years of education: 16   Highest education level: Not on file  Occupational History   Occupation: comptroller    Employer: RETIRED  Tobacco Use   Smoking status: Former    Current packs/day: 0.00    Types: Cigarettes, Cigars    Quit date: 09/18/1968    Years since quitting: 55.6    Passive exposure: Past   Smokeless tobacco: Former    Types: Chew    Quit date: 2005  Vaping Use   Vaping status: Never Used  Substance and Sexual Activity   Alcohol  use: Yes    Alcohol /week: 14.0 standard drinks of alcohol     Types: 14 Standard drinks or equivalent per week    Comment: 2 drinks nightly 03/21/22   Drug use: No   Sexual activity: Not Currently  Other Topics Concern   Not on file  Social History Narrative   HSG, 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 married 1961. 2 sons- '64, '63 , I daughter- '66, 4 grandchildren. work: Psychologist, Forensic, retired but still consults. Golfer, gardner, volunteer. ACP - has Regulatory Affairs Officer; DNR; DNI; no long term HD, no heroic or futile, measures.      No new stressors/       Consumption of caffeine-Coffee      Exercise- 30 mins swimming daily   Social Drivers of Health   Tobacco Use: Medium Risk (04/26/2024)   Patient History    Smoking Tobacco Use: Former    Smokeless Tobacco Use: Former    Passive Exposure: Past  Programmer, Applications: Not on [employer] Insecurity: Not on file  Transportation Needs: Not on file  Physical Activity: Not on file  Stress: Not on file  Social Connections: Not on file  Depression (PHQ2-9): Low Risk (04/30/2024)   Depression (PHQ2-9)    PHQ-2 Score: 0  Alcohol  Screen: Low Risk (11/28/2022)   Alcohol  Screen    Last Alcohol  Screening Score (AUDIT): 4  Housing: Not  on file  Utilities: Not on  file  Health Literacy: Not on file    Review of Systems: A 12 point ROS discussed and pertinent positives are indicated in the HPI above.  All other systems are negative.  Review of Systems  All other systems reviewed and are negative.   Vital Signs: BP 107/61   Pulse 60   Temp 98.8 F (37.1 C) (Oral)   Resp 18   Ht 5' 8 (1.727 m)   Wt 215 lb (97.5 kg)   SpO2 97%   BMI 32.69 kg/m   Physical Exam Constitutional:      General: He is not in acute distress.    Appearance: He is not ill-appearing.  HENT:     Mouth/Throat:     Mouth: Mucous membranes are moist.     Pharynx: Oropharynx is clear.  Cardiovascular:     Rate and Rhythm: Normal rate.  Pulmonary:     Effort: Pulmonary effort is normal.  Abdominal:     Tenderness: There is no abdominal tenderness.  Musculoskeletal:     Right lower leg: No edema.     Left lower leg: No edema.  Skin:    General: Skin is warm and dry.  Neurological:     Mental Status: He is alert and oriented to person, place, and time.      Labs:  CBC: Recent Labs    04/26/24 0841  WBC 8.2  HGB 14.1  HCT 40.9  PLT 176    COAGS: Recent Labs    04/26/24 0841  INR 1.1    BMP: Recent Labs    05/04/23 0000 08/01/23 1140 12/07/23 1100 04/15/24 1018  NA 140 138 139 141  K 4.9 4.7 4.8 5.2*  CL 104 104 103 106  CO2 23* 29 26 27   GLUCOSE  --  94 97 96  BUN 25* 23 22 21   CALCIUM  9.4 9.7 10.0 10.0  CREATININE 1.1 1.11 1.15 1.04  GFRNONAA  --  >60 >60 >60    LIVER FUNCTION TESTS: Recent Labs    05/04/23 0000  AST 26  ALT 24  ALKPHOS 78  ALBUMIN 4.3    TUMOR MARKERS: Recent Labs    12/07/23 1100 04/15/24 1018  CEA 1.91 2.10    Assessment and Plan:  Metastatic colon cancer; pending palliative chemotherapy: Vinie SAILOR. 61, 89 year old male, presents today to the [facility] Interventional Radiology department for an image-guided port-a-catheter placement.   Risks and benefits of image-guided port-a-catheter  placement were discussed with the patient including, but not limited to bleeding, infection, pneumothorax, or fibrin sheath development and need for additional procedures.  All of the patient's questions were answered, patient is agreeable to proceed. He has been NPO.   Consent signed and in chart.   Thank you for this interesting consult.  I greatly enjoyed meeting MANAN OLMO and look forward to participating in their care.  A copy of this report was sent to the requesting provider on this date.  Electronically Signed: Warren Dais, AGACNP-BC 05/02/2024, 9:53 AM   I spent a total of  30 Minutes   in face to face in clinical consultation, greater than 50% of which was counseling/coordinating care for metastatic colon cancer.

## 2024-05-03 ENCOUNTER — Ambulatory Visit: Admitting: Student in an Organized Health Care Education/Training Program

## 2024-05-04 ENCOUNTER — Other Ambulatory Visit: Payer: Self-pay

## 2024-05-04 ENCOUNTER — Other Ambulatory Visit (HOSPITAL_BASED_OUTPATIENT_CLINIC_OR_DEPARTMENT_OTHER): Payer: Self-pay

## 2024-05-06 ENCOUNTER — Non-Acute Institutional Stay: Payer: Self-pay | Admitting: Adult Health

## 2024-05-06 ENCOUNTER — Encounter: Payer: Self-pay | Admitting: Adult Health

## 2024-05-06 ENCOUNTER — Other Ambulatory Visit: Payer: Self-pay | Admitting: *Deleted

## 2024-05-06 ENCOUNTER — Other Ambulatory Visit: Payer: Self-pay

## 2024-05-06 ENCOUNTER — Encounter: Payer: Self-pay | Admitting: Oncology

## 2024-05-06 ENCOUNTER — Other Ambulatory Visit (HOSPITAL_BASED_OUTPATIENT_CLINIC_OR_DEPARTMENT_OTHER): Payer: Self-pay

## 2024-05-06 ENCOUNTER — Inpatient Hospital Stay

## 2024-05-06 VITALS — BP 114/80 | HR 61 | Temp 97.6°F | Ht 68.0 in | Wt 214.4 lb

## 2024-05-06 DIAGNOSIS — I5032 Chronic diastolic (congestive) heart failure: Secondary | ICD-10-CM

## 2024-05-06 DIAGNOSIS — J849 Interstitial pulmonary disease, unspecified: Secondary | ICD-10-CM

## 2024-05-06 DIAGNOSIS — I480 Paroxysmal atrial fibrillation: Secondary | ICD-10-CM

## 2024-05-06 DIAGNOSIS — Z950 Presence of cardiac pacemaker: Secondary | ICD-10-CM

## 2024-05-06 DIAGNOSIS — E785 Hyperlipidemia, unspecified: Secondary | ICD-10-CM

## 2024-05-06 DIAGNOSIS — I2510 Atherosclerotic heart disease of native coronary artery without angina pectoris: Secondary | ICD-10-CM

## 2024-05-06 DIAGNOSIS — I10 Essential (primary) hypertension: Secondary | ICD-10-CM | POA: Diagnosis not present

## 2024-05-06 DIAGNOSIS — C182 Malignant neoplasm of ascending colon: Secondary | ICD-10-CM

## 2024-05-06 MED ORDER — ONDANSETRON HCL 8 MG PO TABS
8.0000 mg | ORAL_TABLET | Freq: Three times a day (TID) | ORAL | 0 refills | Status: DC | PRN
  Filled 2024-05-06 – 2024-05-13 (×3): qty 20, 7d supply, fill #0

## 2024-05-06 MED ORDER — LIDOCAINE-PRILOCAINE 2.5-2.5 % EX CREA
1.0000 | TOPICAL_CREAM | CUTANEOUS | 0 refills | Status: AC | PRN
  Filled 2024-05-06 – 2024-05-13 (×3): qty 30, 30d supply, fill #0

## 2024-05-06 NOTE — Progress Notes (Signed)
 Pharmacist Chemotherapy Monitoring - Initial Assessment    Anticipated start date: 12/325   The following has been reviewed per standard work regarding the patient's treatment regimen: The patient's diagnosis, treatment plan and drug doses, and organ/hematologic function Lab orders and baseline tests specific to treatment regimen  The treatment plan start date, drug sequencing, and pre-medications Prior authorization status  Patient's documented medication list, including drug-drug interaction screen and prescriptions for anti-emetics and supportive care specific to the treatment regimen The drug concentrations, fluid compatibility, administration routes, and timing of the medications to be used The patient's access for treatment and lifetime cumulative dose history, if applicable  The patient's medication allergies and previous infusion related reactions, if applicable   Changes made to treatment plan:  N/A  Follow up needed:  N/A   Donald [name], RPH, 05/06/2024  12:25 PM

## 2024-05-06 NOTE — Progress Notes (Signed)
 PATIENT NAVIGATOR PROGRESS NOTE  Name: Donald [name] Date: 05/06/2024 MRN: [id number]  DOB: [birth year]   Reason for visit:  Patient education session  Comments:  See education note    Time spent counseling/coordinating care: > 60 minutes

## 2024-05-06 NOTE — Progress Notes (Signed)
 "  Location:  [facility]  POS: Clinic  Provider: Tawni America, ANP  Code Status: DNR Goals of Care:     05/02/2024    9:31 AM  Advanced Directives  Does Patient Have a Medical Advance Directive? Yes  Type of Advance Directive Healthcare Power of Attorney  Does patient want to make changes to medical advance directive? No - Patient declined     Chief Complaint  Patient presents with   Follow-up    6 month follow up    HPI: Discussed the use of AI scribe software for clinical note transcription with the patient, who gave verbal consent to proceed.  History of Present Illness Donald [name] is an 89 year old male with adenocarcinoma who presents for follow-up regarding chemotherapy treatment.  Adenocarcinoma of the colon stage T3 N1 s/p 03/24/2021, robotic assisted laparoscopic right colectomy - Saw on oncology for recurrence, FOLFOX chemotherapy ordered will start after the holidays  for retroperitoneal right-sided mid-tissue adenocarcinoma confirmed by biopsy. - Port recently placed for chemotherapy administration. - Mass has enlarged over the past year. - Occasional pain in the area of the mass, but no significant discomfort today.  Atrial fibrillation and cardiac symptoms - History of atrial fibrillation. - No recent palpitations, chest pain, or documented recurrence. - Stable shortness of breath when walking uphill.  Memory loss - Progressive mild memory loss since the summer, described as 'fading.' - Continues to manage home, finances, driving, and assists wife with transportation. - No major change in daily function.  Diarrhea and pancreatic insufficiency - Chronic diarrhea attributed to pancreatic insufficiency. - Diarrhea partially controlled with pancrelipase  and Colestid . - Attentive to fluid intake, particularly due to increased risk of diarrhea with chemotherapy.  Peripheral neuropathy and balance issues - Balance problems and neuropathy in the  soles of the feet from prior chemotherapy. - Stopped cycling due to balance issues. - No falls reported. - Keeps a cane available for assistance.  Fluid management - Takes furosemide  for fluid management. - Adjusts furosemide  dosing based on activity level.  Advance directives - Has a living will and requests no CPR (no code blue) if his heart stops. - Unsure if DNR documentation is at home and plans to confirm.   ILD Pulmonary fibrosis found on high res CT scan  Followed by pulmonary Has sob when walking up hill No further issues.  Has albuterol  inhaler.   Past Medical History:  Diagnosis Date   Abnormal CT scan, stomach 02/2014   Thickening of gastric fundus and cardia.  gastritis on EGD 03/2014   Allergic rhinitis due to pollen    Anemia 12/2020   Arthritis    joints,finger   Arthropathy, unspecified, site unspecified    Atherosclerosis    a. Noted by abdominal CT 02/2014 (h/o normal nuc 2011).   Atrial flutter (HCC) 02/24/2014   s/p ablation 11/15   Blood transfusion without reported diagnosis    Cardiomyopathy (HCC)    tachy mediated - a. TEE (10/15):  EF 30%;  b. Echo after NSR restored (10/15):  mild LVH, EF 55-60%, mild AS, mild AI, mild MR, mild to mod LAE, mild RAE   Cataract    bilateral,removed replaced with implants   CHF (congestive heart failure) (HCC) 01/04/2021   COVID-19 11/2020   Diverticulosis    severe in descending, sigmoid colon.    ED (erectile dysfunction)    Emphysema of lung (HCC)    Esophageal stricture 03/2014   traversable with endoscope. not dilated.  Gastric AVM    GERD (gastroesophageal reflux disease) 03/2014   small HH and gastritis on EGD   GI bleed    Habitual alcohol  use    Hemorrhoids    Hyperlipidemia    Hypertension    Obesity    OSA on CPAP    PAF (paroxysmal atrial fibrillation) (HCC)    Pneumonia 12/2020   mild with covid   Presence of permanent cardiac pacemaker    Prostatitis, unspecified    Pulmonary  fibrosis (HCC)    Sinus bradycardia    a. s/p STJ dual chamber PPM   Skin cancer    basal and squamous cell   Sleep apnea    Substance abuse (HCC)    etoh    Past Surgical History:  Procedure Laterality Date   ATRIAL FLUTTER ABLATION N/A 03/19/2014   RFCA of atrial flutter by Dr Fernande   CARDIOVERSION N/A 02/24/2014   Procedure: CARDIOVERSION;  Surgeon: Redell GORMAN Shallow, MD;  Location: [facility] ENDOSCOPY;  Service: Cardiovascular;  Laterality: N/A;   CARDIOVERSION Right 09/10/2014   Procedure: CARDIOVERSION;  Surgeon: Elspeth JAYSON Fernande, MD;  Location: [facility] CATH LAB;  Service: Cardiovascular;  Laterality: Right;   CARDIOVERSION N/A 01/22/2018   Procedure: CARDIOVERSION;  Surgeon: Okey Vina GAILS, MD;  Location: [facility] ENDOSCOPY;  Service: Cardiovascular;  Laterality: N/A;   COLECTOMY  03/24/2021   COLONOSCOPY N/A 04/23/2015   Procedure: COLONOSCOPY;  Surgeon: Gwendlyn ONEIDA Buddy, MD;  Location: [facility] ENDOSCOPY;  Service: Endoscopy;  Laterality: N/A;   COLONOSCOPY     ELBOW SURGERY Right    SCREW PLACED   ENTEROSCOPY N/A 06/12/2015   Procedure: ENTEROSCOPY;  Surgeon: Gwendlyn ONEIDA Buddy, MD;  Location: [facility] ENDOSCOPY;  Service: Endoscopy;  Laterality: N/A;   EP IMPLANTABLE DEVICE N/A 02/15/2016   Procedure: Pacemaker Implant;  Surgeon: Danelle LELON Birmingham, MD;  Location: [facility] INVASIVE CV LAB;  Service: Cardiovascular;  Laterality: N/A;   ESOPHAGOGASTRODUODENOSCOPY N/A 04/09/2014   Procedure: ESOPHAGOGASTRODUODENOSCOPY (EGD);  Surgeon: Lamar JONETTA Aho, MD;  Location: [facility] ENDOSCOPY;  Service: Endoscopy;  Laterality: N/A;   ESOPHAGOGASTRODUODENOSCOPY N/A 04/23/2015   Procedure: ESOPHAGOGASTRODUODENOSCOPY (EGD);  Surgeon: Gwendlyn ONEIDA Buddy, MD;  Location: [facility] ENDOSCOPY;  Service: Endoscopy;  Laterality: N/A;   INGUINAL HERNIA REPAIR     right   INGUINAL HERNIA REPAIR     left   IR IMAGING GUIDED PORT INSERTION  05/02/2024   LEFT HEART CATH AND CORONARY ANGIOGRAPHY N/A 03/16/2017   Procedure: LEFT HEART CATH AND CORONARY  ANGIOGRAPHY;  Surgeon: Verlin Lonni JONETTA, MD;  Location: [facility] INVASIVE CV LAB;  Service: Cardiovascular;  Laterality: N/A;   ORIF fracture of the elbow  1992   SKIN CANCER EXCISION  05/21/2012   Squamous cell ca   TEE WITHOUT CARDIOVERSION N/A 02/24/2014   Procedure: TRANSESOPHAGEAL ECHOCARDIOGRAM (TEE);  Surgeon: Redell GORMAN Shallow, MD;  Location: [facility] ENDOSCOPY;  Service: Cardiovascular;  Laterality: N/A;   TONSILLECTOMY     AGE 89   UPPER GASTROINTESTINAL ENDOSCOPY      Allergies[1]  Outpatient Encounter Medications as of 05/06/2024  Medication Sig   albuterol  (VENTOLIN  HFA) 108 (90 Base) MCG/ACT inhaler INHALE TWO PUFFS INTO THE LUNGS EVERY 6 HOURS AS NEEDED FOR WHEEZEING OR SHORTNESS OF BREATH   apixaban  (ELIQUIS ) 5 MG TABS tablet Take 1 tablet (5 mg total) by mouth 2 (two) times daily.   carboxymethylcellulose (REFRESH PLUS) 0.5 % SOLN Place 1 drop into both eyes daily as needed (dry eyes).   colestipol  (COLESTID ) 5 g granules  Take 5 g by mouth daily.   diltiazem  (CARDIZEM  CD) 120 MG 24 hr capsule Take 1 capsule (120 mg total) by mouth 2 (two) times daily, at breakfast and at bedtime.   dofetilide  (TIKOSYN ) 500 MCG capsule Take 1 capsule (500 mcg total) by mouth 2 (two) times daily.   furosemide  (LASIX ) 20 MG tablet Take 1 tablet (20 mg total) by mouth 2 (two) times daily.   lidocaine -prilocaine  (EMLA ) cream Apply 1 Application topically as needed.   magnesium  oxide (MAG-OX) 400 MG tablet Take 1 tablet (400 mg total) by mouth at bedtime.   metoprolol  tartrate (LOPRESSOR ) 25 MG tablet Take 1 tablet (25 mg total) by mouth 2 (two) times daily.   ondansetron  (ZOFRAN ) 8 MG tablet Take 1 tablet (8 mg total) by mouth every 8 (eight) hours as needed (Starting 3 days after chemo as needed for nausea).   Pancrelipase , Lip-Prot-Amyl, (ZENPEP ) 60000-189600 units CPEP Take 2 capsules by mouth 3 (three) times daily with meals. Take 1 capsule by mouth with each snack (maximum 2 snacks daily)    Pedialyte (PEDIALYTE) SOLN Take 178 mLs by mouth daily. 6 oz   rosuvastatin  (CRESTOR ) 10 MG tablet Take 1 tablet (10 mg total) by mouth daily.   sildenafil (REVATIO) 20 MG tablet Take 20 mg by mouth daily as needed (erectile dysfunction).   sodium chloride  (OCEAN) 0.65 % SOLN nasal spray Place 1 spray into both nostrils as needed for congestion.   traMADol  (ULTRAM ) 50 MG tablet Take 1 tablet (50 mg total) by mouth 3 (three) times daily as needed for pain.   [DISCONTINUED] 0.9 %  sodium chloride  infusion    [DISCONTINUED] 0.9 %  sodium chloride  infusion    No facility-administered encounter medications on file as of 05/06/2024.    Review of Systems:  Review of Systems  Constitutional:  Negative for activity change, appetite change, chills, diaphoresis, fatigue and fever.  HENT:  Positive for hearing loss. Negative for congestion.   Respiratory:  Positive for shortness of breath (walking up hill). Negative for cough and wheezing.   Cardiovascular:  Negative for chest pain and leg swelling.  Gastrointestinal:  Positive for diarrhea. Negative for abdominal distention, abdominal pain, constipation, nausea and vomiting.  Genitourinary:  Negative for difficulty urinating, dysuria and urgency.  Musculoskeletal:  Positive for back pain. Negative for gait problem, myalgias and neck pain.  Skin:  Negative for rash.  Neurological:  Negative for dizziness and weakness.  Psychiatric/Behavioral:  Negative for confusion.        Memory loss    Health Maintenance  Topic Date Due   Medicare Annual Wellness (AWV)  11/28/2023   COVID-19 Vaccine (10 - Moderna risk 2025-26 season) 08/12/2024   DTaP/Tdap/Td (3 - Td or Tdap) 04/16/2028   Pneumococcal Vaccine: 50+ Years  Completed   Influenza Vaccine  Completed   Zoster Vaccines- Shingrix   Completed   Meningococcal B Vaccine  Aged Out    Physical Exam: Vitals:   05/06/24 0834  BP: 114/80  Pulse: 61  Temp: 97.6 F (36.4 C)  SpO2: 97%  Weight: 214  lb 6.4 oz (97.3 kg)  Height: 5' 8 (1.727 m)   Body mass index is 32.6 kg/m. Weight: 214 lb 6.4 oz (97.3 kg)   Wt Readings from Last 3 Encounters:  05/06/24 214 lb 6.4 oz (97.3 kg)  05/02/24 215 lb (97.5 kg)  04/30/24 214 lb 9.6 oz (97.3 kg)    Physical Exam Vitals and nursing note reviewed.  Constitutional:  Appearance: Normal appearance.  HENT:     Head: Normocephalic and atraumatic.     Right Ear: Tympanic membrane normal.     Left Ear: Tympanic membrane normal.     Nose: Nose normal.     Mouth/Throat:     Mouth: Mucous membranes are moist.     Pharynx: Oropharynx is clear.  Cardiovascular:     Rate and Rhythm: Normal rate and regular rhythm.     Heart sounds: No murmur heard. Pulmonary:     Effort: Pulmonary effort is normal. No respiratory distress.     Breath sounds: Normal breath sounds. No wheezing.  Abdominal:     General: Bowel sounds are normal. There is no distension.     Palpations: Abdomen is soft.     Tenderness: There is no abdominal tenderness.  Musculoskeletal:     Cervical back: No rigidity.     Comments: Trace edema to both ankles  Lymphadenopathy:     Cervical: No cervical adenopathy.  Skin:    General: Skin is warm and dry.  Neurological:     General: No focal deficit present.     Mental Status: He is alert and oriented to person, place, and time. Mental status is at baseline.  Psychiatric:        Mood and Affect: Mood normal.     Labs reviewed: Basic Metabolic Panel: Recent Labs    08/01/23 1140 12/07/23 1100 04/15/24 1018  NA 138 139 141  K 4.7 4.8 5.2*  CL 104 103 106  CO2 29 26 27   GLUCOSE 94 97 96  BUN 23 22 21   CREATININE 1.11 1.15 1.04  CALCIUM  9.7 10.0 10.0   Liver Function Tests: No results for input(s): AST, ALT, ALKPHOS, BILITOT, PROT, ALBUMIN in the last 8760 hours. No results for input(s): LIPASE, AMYLASE in the last 8760 hours. No results for input(s): AMMONIA in the last 8760  hours. CBC: Recent Labs    04/26/24 0841  WBC 8.2  HGB 14.1  HCT 40.9  MCV 94.5  PLT 176   Lipid Panel: No results for input(s): CHOL, HDL, LDLCALC, TRIG, CHOLHDL, LDLDIRECT in the last 8760 hours. Lab Results  Component Value Date   HGBA1C 5.5 03/08/2021    Procedures since last visit: IR IMAGING GUIDED PORT INSERTION Result Date: 05/02/2024 INDICATION: Port-A-Cath needed for treatment of metastatic colon cancer. EXAM: FLUOROSCOPIC AND ULTRASOUND GUIDED PLACEMENT OF A SUBCUTANEOUS PORT MEDICATIONS: Moderate sedation ANESTHESIA/SEDATION: Moderate (conscious) sedation was employed during this procedure. A total of Versed  2 mg and fentanyl  100 mcg was administered intravenously at the order of the provider performing the procedure. Total intra-service moderate sedation time: 28 minutes. Patient's level of consciousness and vital signs were monitored continuously by radiology nurse throughout the procedure under the supervision of the provider performing the procedure. FLUOROSCOPY TIME:  Radiation Exposure Index (as provided by the fluoroscopic device): 6.4 mGy Kerma COMPLICATIONS: None immediate. PROCEDURE: The procedure, risks, benefits, and alternatives were explained to the patient. Questions regarding the procedure were encouraged and answered. The patient understands and consents to the procedure. Patient was placed supine on the interventional table. Ultrasound confirmed a patent right internal jugular vein. Ultrasound image was saved for documentation. The right chest and neck were cleaned with a skin antiseptic and a sterile drape was placed. Maximal barrier sterile technique was utilized including caps, mask, sterile gowns, sterile gloves, sterile drape, hand hygiene and skin antiseptic. The right neck was anesthetized with 1% lidocaine . Small incision was made in  the right neck with a blade. Micropuncture set was placed in the right internal jugular vein with ultrasound  guidance. The micropuncture wire was used for measurement purposes. The right chest was anesthetized with 1% lidocaine  with epinephrine . #15 blade was used to make an incision and a subcutaneous port pocket was formed. 8 french Power Port was assembled. Subcutaneous tunnel was formed with a stiff tunneling device. The port catheter was brought through the subcutaneous tunnel. The port was placed in the subcutaneous pocket. The micropuncture set was exchanged for a peel-away sheath. The catheter was placed through the peel-away sheath and the tip was positioned at the superior cavoatrial junction. Catheter placement was confirmed with fluoroscopy. The port was accessed and flushed with heparinized saline. The port pocket was closed using two layers of absorbable sutures and Dermabond. The vein skin site was closed using a single layer of absorbable suture and Dermabond. Sterile dressings were applied. Patient tolerated the procedure well without an immediate complication. Ultrasound and fluoroscopic images were taken and saved for this procedure. IMPRESSION: Placement of a subcutaneous power-injectable port device. Catheter tip at the superior cavoatrial junction. Electronically Signed   By: Juliene Balder M.D.   On: 05/02/2024 16:36   CT Biopsy Result Date: 04/26/2024 INDICATION: History of colon cancer now with lung nodules and a right retroperitoneal implant concerning for metastatic disease. He presents for CT-guided biopsy. EXAM: CT-guided core biopsy right retroperitoneal implant MEDICATIONS: None. ANESTHESIA/SEDATION: Moderate (conscious) sedation was employed during this procedure. A total of Versed  1.5 mg and Fentanyl  50 mcg was administered intravenously by the radiology nurse. Total intra-service moderate Sedation Time: 10 minutes. The patient's level of consciousness and vital signs were monitored continuously by radiology nursing throughout the procedure under my direct supervision. COMPLICATIONS: None  immediate. PROCEDURE: Informed written consent was obtained from the patient after a thorough discussion of the procedural risks, benefits and alternatives. All questions were addressed. Maximal Sterile Barrier Technique was utilized including caps, mask, sterile gowns, sterile gloves, sterile drape, hand hygiene and skin antiseptic. A timeout was performed prior to the initiation of the procedure. CT imaging was performed. The spiculated soft tissue mass in the right retroperitoneal adipose tissue anterior to the iliac crest is identified. A suitable skin entry site was selected and marked. Local anesthesia was attained by infiltration with 1% lidocaine . A small dermatotomy was made. Under intermittent CT guidance, a 15 cm 17 gauge introducer needle was carefully advanced and positioned at the margin of the mass. Multiple 18 gauge core biopsies were then obtained coaxially using the bio Pince automated biopsy device. Biopsy specimens were placed in formalin and delivered to pathology for further analysis. The introducer needle was removed. Post biopsy CT imaging demonstrates no evidence of immediate complication. IMPRESSION: Successful CT-guided core biopsy of right retroperitoneal soft tissue mass. Electronically Signed   By: Wilkie Lent M.D.   On: 04/26/2024 14:04   CT CHEST ABDOMEN PELVIS W CONTRAST Result Date: 04/16/2024 CLINICAL DATA:  Restaging metastatic colon cancer. * Tracking Code: BO * EXAM: CT CHEST, ABDOMEN, AND PELVIS WITH CONTRAST TECHNIQUE: Multidetector CT imaging of the chest, abdomen and pelvis was performed following the standard protocol during bolus administration of intravenous contrast. RADIATION DOSE REDUCTION: This exam was performed according to the departmental dose-optimization program which includes automated exposure control, adjustment of the mA and/or kV according to patient size and/or use of iterative reconstruction technique. CONTRAST:  OMNIPAQUE  IOHEXOL  300 MG/ML   SOLN COMPARISON:  Multiple previous imaging studies. The  most recent CT scan is 12/07/1998 FINDINGS: CT CHEST FINDINGS Cardiovascular: The heart is normal in size. No pericardial effusion. The aorta is normal in caliber. No dissection. Stable atherosclerotic calcifications. Stable calcifications at the aortic valve. Stable coronary artery calcifications. The pacer wires are in good position, unchanged. Mediastinum/Nodes: Stable borderline enlarged mediastinal lymph nodes. No new or progressive findings. The esophagus is unremarkable. Small hiatal hernia is stable. Lungs/Pleura: Numerous metastatic pulmonary nodules. Anterior right upper lobe pulmonary nodule on image 52/3 measures 8 mm and previously measured 4 mm. Right middle lobe pulmonary nodule on image 76/3 measures 10 mm and previously measured 4 mm. 9 mm left lower lobe pulmonary nodule on image 116/3 previously measured 4 mm. 6 mm lingular nodule on image 100/3 previously measured 3 mm. Several new small scattered pulmonary nodules. Stable underlying emphysematous changes and pulmonary scarring. No pleural effusions or pleural nodules. Musculoskeletal: No chest wall mass, supraclavicular or axillary adenopathy. Stable 14 mm right thyroid  nodule. No worrisome bone lesions. CT ABDOMEN PELVIS FINDINGS Hepatobiliary: Stable scattered hepatic cysts. New 10 mm low-attenuation lesion in segment 5/6 on image 61/2 is concerning for a metastatic focus. 7 mm lesion in segment 6 on image 64/2 is indeterminate but stable. The gallbladder is unremarkable.  No common bile duct dilatation. Pancreas: No mass, inflammation or ductal dilatation. Spleen: Normal size.  No focal lesions. Adrenals/Urinary Tract: The adrenal glands are normal. Stable simple renal cysts. No worrisome renal lesions or hydronephrosis. The bladder is unremarkable. Stomach/Bowel: The stomach, duodenum, small bowel and colon are unremarkable. No mass lesions or obstructive findings. Stable surgical  changes from a right hemicolectomy. Stable colonic diverticulosis. Vascular/Lymphatic: Stable advanced atherosclerotic calcification involving the aorta and branch vessels but no aneurysm or dissection. The major venous structures are patent. The ill-defined necrotic appearing central mesenteric mass measures approximately 10.5 x 5.6 cm. This previously measured 6.0 x 5.2 cm. Adjacent mesenteric nodes are again noted. The largest node measures 18 mm on image 80/2. Mesenteric node on image 69/2 measures 15 mm and previously measured 11 mm. Right-sided peritoneal implant on image 86/2 measures 2.7 x 2.3 cm and previously measured 2.2 x 1.6 cm. Reproductive: Stable enlarged prostate gland. The seminal vesicles are unremarkable. Other: No ascites or abdominal wall hernia. Musculoskeletal: No worrisome bone lesions. Stable degenerative changes involving the spine and hips. IMPRESSION: 1. Progressive pulmonary metastatic disease. 2. New 10 mm low-attenuation lesion in segment 5/6 of the liver is concerning for a metastatic focus. 3. Enlarging central mesenteric mass and adjacent mesenteric lymph nodes. 4. Enlarging right-sided peritoneal implant. 5. Stable surgical changes from a right hemicolectomy. 6. Stable advanced atherosclerotic calcification involving the thoracic and abdominal aorta and branch vessels including the coronary arteries. 7. Stable enlarged prostate gland. 8. Aortic atherosclerosis. Aortic Atherosclerosis (ICD10-I70.0). Electronically Signed   By: MYRTIS Stammer M.D.   On: 04/16/2024 15:14     Assessment & Plan Malignant neoplasm of the ascending colon Biopsy confirmed adenocarcinoma in the retroperitoneal mass. Tumor necessitates chemotherapy per Dr Cloretta (FOLFOX).SABRA He is a no-code status, opting against CPR. - Monitor for chemotherapy side effects, including neuropathy and diarrhea. - Discuss with social worker if home care assistance is needed.  Diarrhea - Continue pancreatic enzyme  replacement therapy and Colestid . -discussed that if his diarrhea is getting worse to let us  know as he is also on lasix  and starting chemo - Follow up with gastroenterologist Dr. Marysue in January.  Peripheral neuropathy secondary to chemotherapy Mild neuropathy in the soles of the  feet, likely secondary to previous chemotherapy.  - Monitor for worsening neuropathy symptoms. - Consider physical therapy if balance issues arise.  Cognitive impairment Mild cognitive impairment with some memory loss. Slight worsening since last visit. Able to manage daily activities. - Encouraged cognitive activities such as reading, puzzles, and socializing.  Hyperlipidemia -on crestor   Due for lipid panel. Previous lipid panel was done in December last year. - Ordered lipid panel. - Coordinated with oncology team to include lipid panel in blood work.  HTN Controlled  OSA On cpap   ILD No current issues.   CAD On statin and eliquis   Pacemaker  Afib Rate is controlled with tikosyn , cardizem ,and metoprolol   On eliquis  Followed by cardiology   CHF (diastolic) EF 65% with grade 1 DD 02/19/21 On lasix  Euvolemic on exam        Labs/tests ordered:  * No order type specified *labs drawn with oncology, will add lipid panel if possible.  Next appt:  6 months   Total time :  time greater than 50% of total time spent doing pt counseling and coordination of care         [1] No Known Allergies  "

## 2024-05-07 ENCOUNTER — Encounter: Payer: Self-pay | Admitting: Oncology

## 2024-05-07 ENCOUNTER — Other Ambulatory Visit (HOSPITAL_BASED_OUTPATIENT_CLINIC_OR_DEPARTMENT_OTHER): Payer: Self-pay

## 2024-05-08 ENCOUNTER — Other Ambulatory Visit (HOSPITAL_COMMUNITY): Payer: Self-pay

## 2024-05-08 ENCOUNTER — Telehealth: Payer: Self-pay | Admitting: Pharmacy Technician

## 2024-05-08 ENCOUNTER — Encounter: Payer: Self-pay | Admitting: Oncology

## 2024-05-08 NOTE — Telephone Encounter (Signed)
 Oral Oncology Patient Advocate Encounter  Prior Authorization for lidocaine -prilocaine  (EMLA ) cream has been approved.    PA# [id number] Effective dates: 05/08/2024 through 08/06/2024   Barbette (Patty) Chet Burnet, CPhT  [facility], [place], [place] Hematology/Oncology - Oral Chemotherapy Patient Advocate Specialist III Phone: [phone]  Fax: [phone]

## 2024-05-08 NOTE — Telephone Encounter (Signed)
 Oral Oncology Patient Advocate Encounter   New authorization   Received notification that prior authorization for lidocaine -prilocaine  (EMLA ) cream is required.   PA submitted on CMM via [employer] [id number] Status is pending     Rei Contee (Patty) Chet Burnet, CPhT  [facility], [place], [place] Hematology/Oncology - Oral Chemotherapy Patient Advocate Specialist III Phone: [phone]  Fax: [phone]

## 2024-05-08 NOTE — Telephone Encounter (Signed)
 Oral Oncology Patient Advocate Encounter   New authorization   Received notification that prior authorization for ondansetron  is required.   PA submitted on CMM via Latent [employer] [id number] Status is pending     Donald [name] (Patty) Chet Burnet, CPhT  [facility], [place], [facility] - Oral Chemotherapy Patient Advocate Specialist III Phone: [phone]  Fax: [phone]

## 2024-05-10 ENCOUNTER — Other Ambulatory Visit (HOSPITAL_BASED_OUTPATIENT_CLINIC_OR_DEPARTMENT_OTHER): Payer: Self-pay

## 2024-05-10 ENCOUNTER — Other Ambulatory Visit: Payer: Self-pay | Admitting: Internal Medicine

## 2024-05-11 ENCOUNTER — Other Ambulatory Visit: Payer: Self-pay

## 2024-05-12 ENCOUNTER — Other Ambulatory Visit: Payer: Self-pay | Admitting: Oncology

## 2024-05-13 ENCOUNTER — Telehealth: Payer: Self-pay | Admitting: Student in an Organized Health Care Education/Training Program

## 2024-05-13 ENCOUNTER — Other Ambulatory Visit (HOSPITAL_BASED_OUTPATIENT_CLINIC_OR_DEPARTMENT_OTHER): Payer: Self-pay

## 2024-05-13 ENCOUNTER — Encounter: Payer: Self-pay | Admitting: Oncology

## 2024-05-13 ENCOUNTER — Other Ambulatory Visit (HOSPITAL_COMMUNITY): Payer: Self-pay

## 2024-05-13 NOTE — Telephone Encounter (Signed)
 Pharmacy comment: Pt is between doctors since his cardiologist retired. Could you authorize a refill for him?   Pended and forwarded to Dr. Charlanne for approval/denial.

## 2024-05-13 NOTE — Telephone Encounter (Signed)
 Oral Oncology Patient Advocate Encounter  Prior Authorization for ondansetron  has been approved.    Dawayne Ohair (Patty) Chet Burnet, CPhT  [facility], [place], [facility] - Oral Chemotherapy Patient Advocate Specialist III Phone: [phone]  Fax: [phone]

## 2024-05-13 NOTE — Telephone Encounter (Signed)
" °*  STAT* If patient is at the pharmacy, call can be transferred to refill team.   1. Which medications need to be refilled? (please list name of each medication and dose if known) metoprolol  tartrate (LOPRESSOR ) 25 MG tablet Take 1 tablet (25 mg total) by mouth 2 (two) times daily. apixaban  (ELIQUIS ) 5 MG TABS tablet Take 1 tablet (5 mg total) by mouth 2 (two) times daily.  diltiazem  (CARDIZEM  CD) 120 MG 24 hr capsule  Take 1 capsule (120 mg total) by mouth 2 (two) times daily, at breakfast and at bedtime    2. Would you like to learn more about the convenience, safety, & potential cost savings by using the [facility]? No    3. Are you open to using the [facility] No   4. Which pharmacy/location (including street and city if local pharmacy) is medication to be sent to? [facility]     5. Do they need a 30 day or 90 day supply? 90 Day Supply "

## 2024-05-14 ENCOUNTER — Encounter: Payer: Self-pay | Admitting: Nurse Practitioner

## 2024-05-14 ENCOUNTER — Other Ambulatory Visit: Payer: Self-pay

## 2024-05-14 ENCOUNTER — Inpatient Hospital Stay: Admitting: Nurse Practitioner

## 2024-05-14 ENCOUNTER — Inpatient Hospital Stay

## 2024-05-14 ENCOUNTER — Ambulatory Visit (INDEPENDENT_AMBULATORY_CARE_PROVIDER_SITE_OTHER): Payer: PPO

## 2024-05-14 ENCOUNTER — Other Ambulatory Visit (HOSPITAL_BASED_OUTPATIENT_CLINIC_OR_DEPARTMENT_OTHER): Payer: Self-pay

## 2024-05-14 VITALS — BP 118/68 | HR 69 | Temp 97.9°F | Resp 18 | Ht 68.0 in | Wt 214.8 lb

## 2024-05-14 DIAGNOSIS — I5032 Chronic diastolic (congestive) heart failure: Secondary | ICD-10-CM

## 2024-05-14 DIAGNOSIS — C180 Malignant neoplasm of cecum: Secondary | ICD-10-CM | POA: Diagnosis not present

## 2024-05-14 DIAGNOSIS — C182 Malignant neoplasm of ascending colon: Secondary | ICD-10-CM

## 2024-05-14 DIAGNOSIS — I4819 Other persistent atrial fibrillation: Secondary | ICD-10-CM

## 2024-05-14 LAB — CUP PACEART REMOTE DEVICE CHECK
Battery Remaining Longevity: 23 mo
Battery Remaining Percentage: 20 %
Battery Voltage: 2.92 V
Brady Statistic AP VP Percent: 1 %
Brady Statistic AP VS Percent: 97 %
Brady Statistic AS VP Percent: 1 %
Brady Statistic AS VS Percent: 2.4 %
Brady Statistic RA Percent Paced: 96 %
Brady Statistic RV Percent Paced: 1 %
Date Time Interrogation Session: 20251230020014
Implantable Lead Connection Status: 753985
Implantable Lead Connection Status: 753985
Implantable Lead Implant Date: 20171002
Implantable Lead Implant Date: 20171002
Implantable Lead Location: 753859
Implantable Lead Location: 753860
Implantable Pulse Generator Implant Date: 20171002
Lead Channel Impedance Value: 440 Ohm
Lead Channel Impedance Value: 610 Ohm
Lead Channel Pacing Threshold Amplitude: 0.5 V
Lead Channel Pacing Threshold Amplitude: 0.75 V
Lead Channel Pacing Threshold Pulse Width: 0.5 ms
Lead Channel Pacing Threshold Pulse Width: 0.5 ms
Lead Channel Sensing Intrinsic Amplitude: 12 mV
Lead Channel Sensing Intrinsic Amplitude: 4.4 mV
Lead Channel Setting Pacing Amplitude: 1.5 V
Lead Channel Setting Pacing Amplitude: 2.5 V
Lead Channel Setting Pacing Pulse Width: 0.5 ms
Lead Channel Setting Sensing Sensitivity: 2 mV
Pulse Gen Model: 2272
Pulse Gen Serial Number: 7951215

## 2024-05-14 LAB — CBC WITH DIFFERENTIAL (CANCER CENTER ONLY)
Abs Immature Granulocytes: 0.02 K/uL (ref 0.00–0.07)
Basophils Absolute: 0.1 K/uL (ref 0.0–0.1)
Basophils Relative: 1 %
Eosinophils Absolute: 0.2 K/uL (ref 0.0–0.5)
Eosinophils Relative: 2 %
HCT: 40.3 % (ref 39.0–52.0)
Hemoglobin: 14 g/dL (ref 13.0–17.0)
Immature Granulocytes: 0 %
Lymphocytes Relative: 22 %
Lymphs Abs: 2.1 K/uL (ref 0.7–4.0)
MCH: 32.4 pg (ref 26.0–34.0)
MCHC: 34.7 g/dL (ref 30.0–36.0)
MCV: 93.3 fL (ref 80.0–100.0)
Monocytes Absolute: 0.7 K/uL (ref 0.1–1.0)
Monocytes Relative: 7 %
Neutro Abs: 6.3 K/uL (ref 1.7–7.7)
Neutrophils Relative %: 68 %
Platelet Count: 197 K/uL (ref 150–400)
RBC: 4.32 MIL/uL (ref 4.22–5.81)
RDW: 13 % (ref 11.5–15.5)
WBC Count: 9.3 K/uL (ref 4.0–10.5)
nRBC: 0 % (ref 0.0–0.2)

## 2024-05-14 LAB — CMP (CANCER CENTER ONLY)
ALT: 21 U/L (ref 0–44)
AST: 25 U/L (ref 15–41)
Albumin: 4.2 g/dL (ref 3.5–5.0)
Alkaline Phosphatase: 81 U/L (ref 38–126)
Anion gap: 12 (ref 5–15)
BUN: 22 mg/dL (ref 8–23)
CO2: 26 mmol/L (ref 22–32)
Calcium: 9.6 mg/dL (ref 8.9–10.3)
Chloride: 101 mmol/L (ref 98–111)
Creatinine: 1.11 mg/dL (ref 0.61–1.24)
GFR, Estimated: >60 mL/min
Glucose, Bld: 149 mg/dL — ABNORMAL HIGH (ref 70–99)
Potassium: 4 mmol/L (ref 3.5–5.1)
Sodium: 138 mmol/L (ref 135–145)
Total Bilirubin: 0.5 mg/dL (ref 0.0–1.2)
Total Protein: 7.2 g/dL (ref 6.5–8.1)

## 2024-05-14 MED ORDER — DILTIAZEM HCL ER COATED BEADS 120 MG PO CP24
120.0000 mg | ORAL_CAPSULE | Freq: Two times a day (BID) | ORAL | 0 refills | Status: DC
  Filled 2024-05-14: qty 60, 30d supply, fill #0

## 2024-05-14 MED ORDER — FLUOROURACIL CHEMO INJECTION 2.5 GM/50ML
400.0000 mg/m2 | Freq: Once | INTRAVENOUS | Status: AC
  Administered 2024-05-15: 850 mg via INTRAVENOUS
  Filled 2024-05-14: qty 17

## 2024-05-14 MED ORDER — APIXABAN 5 MG PO TABS
5.0000 mg | ORAL_TABLET | Freq: Two times a day (BID) | ORAL | 0 refills | Status: DC
  Filled 2024-05-14: qty 60, 30d supply, fill #0

## 2024-05-14 MED ORDER — SODIUM CHLORIDE 0.9 % IV SOLN
2000.0000 mg/m2 | INTRAVENOUS | Status: DC
  Administered 2024-05-15: 4300 mg via INTRAVENOUS
  Filled 2024-05-14: qty 86

## 2024-05-14 MED ORDER — DEXAMETHASONE 4 MG PO TABS
4.0000 mg | ORAL_TABLET | Freq: Every day | ORAL | 0 refills | Status: DC
  Filled 2024-05-14: qty 3, 3d supply, fill #0

## 2024-05-14 MED ORDER — METOPROLOL TARTRATE 25 MG PO TABS
25.0000 mg | ORAL_TABLET | Freq: Two times a day (BID) | ORAL | 0 refills | Status: DC
  Filled 2024-05-14: qty 60, 30d supply, fill #0

## 2024-05-14 NOTE — Progress Notes (Signed)
 " [facility] OFFICE PROGRESS NOTE   Diagnosis: Colon cancer  INTERVAL HISTORY:   Donald [name] returns as scheduled.  He is seen prior to beginning cycle 1 FOLFOX 05/15/2024.  No nausea.  No change in baseline bowel habits.  He has a good appetite.  Objective:  Vital signs in last 24 hours:  Blood pressure 118/68, pulse 69, temperature 97.9 F (36.6 C), temperature source Temporal, resp. rate 18, height 5' 8 (1.727 m), weight 214 lb 12.8 oz (97.4 kg), SpO2 96%.    HEENT: No thrush or ulcers. Resp: Lungs clear bilaterally. Cardio: Regular rate and rhythm. GI: No hepatosplenomegaly.  Nontender. Vascular: Trace lower leg edema bilaterally. Port-A-Cath without erythema.  Lab Results:  Lab Results  Component Value Date   WBC 9.3 05/14/2024   HGB 14.0 05/14/2024   HCT 40.3 05/14/2024   MCV 93.3 05/14/2024   PLT 197 05/14/2024   NEUTROABS 6.3 05/14/2024    Imaging:  No results found.  Medications: I have reviewed the patient's current medications.  Assessment/Plan: Colon cancer-stage III (T3N1), cecum mass biopsy 02/03/2021-adenocarcinoma CTs 02/16/2021-stable interstitial lung disease, mild COPD, cardiomegaly, CAD, multiple liver cysts with a few subcentimeter too small to characterize liver lesions, circumferential wall thickening at the ileocecal valve, right lower quadrant ileocolic lymph node measures 1.1 cm 03/24/2021, robotic assisted laparoscopic right colectomy-T3, 1/8 lymph nodes, 1 tumor deposit, lymphovascular invasion, mismatch repair protein expression intact, MSS: Foundation 1-HRD signature negative, MSS, tumor mutation burden 1, K-ras G12V Cycle 1 adjuvant Xeloda  04/21/2021 Cycle 2 adjuvant Xeloda  05/12/2021 Cycle 3 adjuvant Xeloda  06/07/2021 (5-day delay in start date due to foot discomfort, travel plans) Cycle 4 adjuvant Xeloda  06/29/2021, Xeloda  dose reduced to 1000 mg twice daily for 14 days Cycle 5 adjuvant Xeloda  07/19/2021 Cycle 6 adjuvant Xeloda   08/09/2021 Cycle 7 adjuvant Xeloda  08/30/2021 Cycle 8 adjuvant Xeloda  09/20/2021 CTs 02/14/2022-no evidence of recurrent disease CT abdomen/pelvis 03/14/2023- right central mesentery and right iliac fossa implants PET 02/01/2023: Hypermetabolic right small bowel mesenteric mass, mild focal hypermetabolism in the distal esophagus-nonspecific.  The right pelvic nodule is hypermetabolic. CT abdomen/pelvis 08/01/2023: Mild increase in size of right abdominal mesenteric and right lower quadrant peritoneal metastases, no new site of metastatic disease CTs 12/07/2023:  enlargement of central mesenteric mass, able to centimeter mass adjacent to the right psoas, new pulmonary nodules measuring up to 4 mm CTs 04/15/2024: New and enlarging pulmonary nodules, enlargement of central mesenteric mass and right sided peritoneal implant, new 10 mm low-attenuation segment 5/6 liver lesion, enlargement of lymph nodes adjacent to the mesenteric mass 04/26/2024 CT biopsy of right retroperitoneal mass: Adenocarcinoma, CK20 and CDX2 positive consistent with a colonic primary Cycle 1 FOLFOX 05/15/2024 CAD Interstitial lung disease Iron  deficiency anemia secondary to #1 Multiple colon polyps on the colonoscopy 02/03/2021-microscopic fragments of adenocarcinoma involving biopsies of transverse and ascending colon polyps felt to be contaminant from the cecum biopsy 2 tubular adenomas on the right colectomy specimen 03/24/2021 OSA Sick sinus syndrome-pacemaker Atrial fibrillation-maintained on apixaban  Chronic diastolic CHF History of multiple skin cancers  Disposition: Donald [name] has metastatic colon cancer.  He is scheduled to begin treatment with FOLFOX 05/15/2024.  We again reviewed potential toxicities.  He agrees to proceed.  CBC and chemistry panel reviewed.  Labs adequate for treatment.  Baseline EKG today and with treatment in 2 weeks to monitor for QT prolongation.  He will return for follow-up and cycle 2 FOLFOX in 2  weeks.  We are available to see him sooner  if needed.  Plan reviewed with Dr. Cloretta.  Olam Ned ANP/GNP-BC   05/14/2024  1:49 PM  05/15/2024 10:43 AM-EKG from yesterday has been reviewed with cardiology.  Tikosyn  dose will be adjusted.  Holding Aloxi/Zofran /Compazine.  Dexamethasone  prophylaxis for nausea.  EKG 05/17/2024.  I reviewed this information with Donald [name].  He agrees to proceed with treatment.      "

## 2024-05-14 NOTE — Telephone Encounter (Signed)
"  RX sent in.    "

## 2024-05-14 NOTE — Telephone Encounter (Signed)
 Prescription refill request for Eliquis  received. Indication: A-Fib Last office visit: 04/26/23 Scr: 1.04 04/15/24 Age: 89 Weight: 97.3 KG Pt has passed Parameters. Has an appt with a New Cardiologist in January 2026

## 2024-05-14 NOTE — Patient Instructions (Signed)

## 2024-05-15 ENCOUNTER — Other Ambulatory Visit (HOSPITAL_COMMUNITY): Payer: Self-pay | Admitting: *Deleted

## 2024-05-15 ENCOUNTER — Encounter: Payer: Self-pay | Admitting: Oncology

## 2024-05-15 ENCOUNTER — Other Ambulatory Visit (HOSPITAL_BASED_OUTPATIENT_CLINIC_OR_DEPARTMENT_OTHER): Payer: Self-pay

## 2024-05-15 ENCOUNTER — Other Ambulatory Visit: Payer: Self-pay

## 2024-05-15 ENCOUNTER — Inpatient Hospital Stay

## 2024-05-15 VITALS — BP 153/69 | HR 61 | Temp 98.0°F | Resp 16

## 2024-05-15 DIAGNOSIS — C182 Malignant neoplasm of ascending colon: Secondary | ICD-10-CM

## 2024-05-15 DIAGNOSIS — C180 Malignant neoplasm of cecum: Secondary | ICD-10-CM | POA: Diagnosis not present

## 2024-05-15 LAB — LIPID PANEL
Chol/HDL Ratio: 2.4 ratio (ref 0.0–5.0)
Cholesterol, Total: 121 mg/dL (ref 100–199)
HDL: 50 mg/dL
LDL Chol Calc (NIH): 52 mg/dL (ref 0–99)
Triglycerides: 105 mg/dL (ref 0–149)
VLDL Cholesterol Cal: 19 mg/dL (ref 5–40)

## 2024-05-15 MED ORDER — LEUCOVORIN CALCIUM INJECTION 350 MG
400.0000 mg/m2 | Freq: Once | INTRAVENOUS | Status: AC
  Administered 2024-05-15: 864 mg via INTRAVENOUS
  Filled 2024-05-15: qty 43.2

## 2024-05-15 MED ORDER — DOFETILIDE 250 MCG PO CAPS
500.0000 ug | ORAL_CAPSULE | Freq: Two times a day (BID) | ORAL | 6 refills | Status: DC
  Filled 2024-05-15: qty 60, 15d supply, fill #0

## 2024-05-15 MED ORDER — OXALIPLATIN CHEMO INJECTION 100 MG/20ML
65.0000 mg/m2 | Freq: Once | INTRAVENOUS | Status: AC
  Administered 2024-05-15: 150 mg via INTRAVENOUS
  Filled 2024-05-15: qty 20

## 2024-05-15 MED ORDER — DOFETILIDE 250 MCG PO CAPS
250.0000 ug | ORAL_CAPSULE | Freq: Two times a day (BID) | ORAL | 6 refills | Status: AC
  Filled 2024-05-15: qty 60, 30d supply, fill #0
  Filled 2024-06-02 – 2024-06-09 (×2): qty 60, 30d supply, fill #1

## 2024-05-15 MED ORDER — DEXTROSE 5 % IV SOLN: INTRAVENOUS | Status: DC

## 2024-05-15 MED ORDER — DEXAMETHASONE SOD PHOSPHATE PF 10 MG/ML IJ SOLN
10.0000 mg | Freq: Once | INTRAMUSCULAR | Status: AC
  Administered 2024-05-15: 10 mg via INTRAVENOUS

## 2024-05-15 NOTE — Progress Notes (Signed)
 Pt and wife were educated about each step of treatment today as it occurred. Watched the [employer] video and provided a spill kit with instructions. Reviewed AVS with pt and wife and all questions were answered.   Pt did have a few dry heaves after the loading dose of 5FU and pump was placed. Spit out sputum (no emesis). Lasted for 3-5 minutes and resolved. He can only use Decadron  for nausea because all of the other meds interact with his heart meds. He had Decadron  as a premed today and has a rx ready for pick up for Decadron  the next three days.

## 2024-05-15 NOTE — Progress Notes (Signed)
 Remote PPM Transmission

## 2024-05-15 NOTE — Patient Instructions (Addendum)
 [facility] - A DEPT OF [facility]  Discharge Instructions: Thank you for choosing [facility] to provide your oncology and hematology care.  DO NOT PICK UP THE ZOFRAN  (ONDANSETRON ). Will use the Decadron  (dexamethasone ) daily for nausea.  If you have a lab appointment with the Cancer Center, please go directly to the Cancer Center and check in at the registration area.   Wear comfortable clothing and clothing appropriate for easy access to any Portacath or PICC line.   We strive to give you quality time with your provider. You may need to reschedule your appointment if you arrive late (15 or more minutes).  Arriving late affects you and other patients whose appointments are after yours.  Also, if you miss three or more appointments without notifying the office, you may be dismissed from the clinic at the providers discretion.      For prescription refill requests, have your pharmacy contact our office and allow 72 hours for refills to be completed.    Today you received the following chemotherapy and/or immunotherapy agents: oxaliplatin, leucovorin and fluorouracil (5FU)   To help prevent nausea and vomiting after your treatment, we encourage you to take your nausea medication as directed.  BELOW ARE SYMPTOMS THAT SHOULD BE REPORTED IMMEDIATELY: *FEVER GREATER THAN 100.4 F (38 C) OR HIGHER *CHILLS OR SWEATING *NAUSEA AND VOMITING THAT IS NOT CONTROLLED WITH YOUR NAUSEA MEDICATION *UNUSUAL SHORTNESS OF BREATH *UNUSUAL BRUISING OR BLEEDING *URINARY PROBLEMS (pain or burning when urinating, or frequent urination) *BOWEL PROBLEMS (unusual diarrhea, constipation, pain near the anus) TENDERNESS IN MOUTH AND THROAT WITH OR WITHOUT PRESENCE OF ULCERS (sore throat, sores in mouth, or a toothache) UNUSUAL RASH, SWELLING OR PAIN  UNUSUAL VAGINAL DISCHARGE OR ITCHING   Items with * indicate a potential emergency and should be followed up as soon as  possible or go to the Emergency Department if any problems should occur.  Please show the CHEMOTHERAPY ALERT CARD or IMMUNOTHERAPY ALERT CARD at check-in to the Emergency Department and triage nurse.  Should you have questions after your visit or need to cancel or reschedule your appointment, please contact [facility] - A DEPT OF [facility]  Dept: [phone]  and follow the prompts.  Office hours are 8:00 a.m. to 4:30 p.m. Monday - Friday. Please note that voicemails left after 4:00 p.m. may not be returned until the following business day.  We are closed weekends and major holidays. You have access to a nurse at all times for urgent questions. Please call the main number to the clinic Dept: [phone] and follow the prompts.   For any non-urgent questions, you may also contact your provider using MyChart. We now offer e-Visits for anyone 69 and older to request care online for non-urgent symptoms. For details visit [email or web address].   Also download the MyChart app! Go to the app store, search MyChart, open the app, select [facility], and log in with your MyChart username and password. ______________________________________________________________  The chemotherapy medication bag should finish at 46 hours, 96 hours, or 7 days. For example, if your pump is scheduled for 46 hours and it was put on at 4:00 p.m., it should finish at 2:00 p.m. the day it is scheduled to come off regardless of your appointment time.     Estimated time to finish at 12:15pm on Friday 05/17/24.   If the display on your pump reads Low Volume and it is beeping,  take the batteries out of the pump and come to the cancer center for it to be taken off.   If the pump alarms go off prior to the pump reading Low Volume then call [phone] and someone can assist you.  If the plunger comes out and the chemotherapy medication is leaking out, please use your home chemo spill kit  to clean up the spill. Do NOT use paper towels or other household products.  If you have problems or questions regarding your pump, please call either [phone] (24 hours a day) or the cancer center Monday-Friday 8:00 a.m.- 4:30 p.m. at the clinic number and we will assist you. If you are unable to get assistance, then go to the nearest Emergency Department and ask the staff to contact the IV team for assistance.

## 2024-05-17 ENCOUNTER — Encounter: Payer: Self-pay | Admitting: Oncology

## 2024-05-17 ENCOUNTER — Telehealth: Payer: Self-pay | Admitting: *Deleted

## 2024-05-17 ENCOUNTER — Inpatient Hospital Stay: Attending: Oncology

## 2024-05-17 ENCOUNTER — Other Ambulatory Visit: Payer: Self-pay

## 2024-05-17 VITALS — BP 135/60 | HR 60 | Temp 97.7°F | Resp 18

## 2024-05-17 DIAGNOSIS — C182 Malignant neoplasm of ascending colon: Secondary | ICD-10-CM

## 2024-05-17 MED ORDER — SODIUM CHLORIDE 0.9% FLUSH: 10.0000 mL | INTRAVENOUS | Status: DC | PRN

## 2024-05-17 NOTE — Patient Instructions (Signed)

## 2024-05-17 NOTE — Telephone Encounter (Signed)
 Notified Donald [name] that cardiology reviewed his EKG and it was good. Plan is to do EKG on day of the next 2 cycles. He understands and agrees. He is asking if he should keep taking the dexamethasone  today and tomorrow since he is not nauseated. Instructed to take as directed, since nausea usually starts on day 3 or 4. He agrees to continue.

## 2024-05-19 ENCOUNTER — Ambulatory Visit: Payer: Self-pay | Admitting: Student in an Organized Health Care Education/Training Program

## 2024-05-19 ENCOUNTER — Encounter: Payer: Self-pay | Admitting: Internal Medicine

## 2024-05-20 NOTE — Telephone Encounter (Signed)
 Darlean Maus, NP to Me (Selected Message)     05/20/24  9:33 AM Could you get him Dr Ira schedule for tomorrow?      Appointment scheduled for [facility] at 2:40. Message sent to patient.

## 2024-05-21 ENCOUNTER — Encounter: Payer: Self-pay | Admitting: Internal Medicine

## 2024-05-21 ENCOUNTER — Encounter: Admitting: Internal Medicine

## 2024-05-23 ENCOUNTER — Encounter: Payer: Self-pay | Admitting: Oncology

## 2024-05-23 ENCOUNTER — Other Ambulatory Visit (HOSPITAL_BASED_OUTPATIENT_CLINIC_OR_DEPARTMENT_OTHER): Payer: Self-pay

## 2024-05-23 ENCOUNTER — Ambulatory Visit: Admitting: Internal Medicine

## 2024-05-23 ENCOUNTER — Other Ambulatory Visit

## 2024-05-23 ENCOUNTER — Encounter: Payer: Self-pay | Admitting: Internal Medicine

## 2024-05-23 VITALS — BP 130/62 | HR 67 | Ht 68.0 in | Wt 211.1 lb

## 2024-05-23 DIAGNOSIS — C189 Malignant neoplasm of colon, unspecified: Secondary | ICD-10-CM | POA: Diagnosis not present

## 2024-05-23 DIAGNOSIS — D509 Iron deficiency anemia, unspecified: Secondary | ICD-10-CM | POA: Diagnosis not present

## 2024-05-23 DIAGNOSIS — K8689 Other specified diseases of pancreas: Secondary | ICD-10-CM | POA: Diagnosis not present

## 2024-05-23 DIAGNOSIS — K529 Noninfective gastroenteritis and colitis, unspecified: Secondary | ICD-10-CM | POA: Diagnosis not present

## 2024-05-23 MED ORDER — COLESTIPOL HCL 5 G PO GRAN: 10.0000 g | GRANULES | Freq: Every day | ORAL | Status: AC

## 2024-05-23 MED ORDER — ZENPEP 60000-189600 UNITS PO CPEP
2.0000 | ORAL_CAPSULE | Freq: Three times a day (TID) | ORAL | 2 refills | Status: DC
  Filled 2024-05-23: qty 300, 38d supply, fill #0
  Filled 2024-05-24: qty 100, 13d supply, fill #0

## 2024-05-23 NOTE — Progress Notes (Signed)
 A user error has taken place.

## 2024-05-23 NOTE — Progress Notes (Signed)
 "  Subjective:    Patient ID: Donald [name], male    DOB: [birth year], 89 y.o.   MRN: [id number]  HPI Donald [name] is an 89 year old male with metastatic colon cancer status post right hemicolectomy with ileocolonic anastomosis, prior iron  deficiency anemia, atrial fib/flutter on Tikosyn  and Eliquis , and chronic diarrhea who presents for follow-up of gastrointestinal symptoms also have restarted chemotherapy.  Diagnosed with stage III cecal adenocarcinoma in 2022, with subsequent mesenteric and right retroperitoneal involvement confirmed by biopsy in December 2025. Underwent right hemicolectomy with end-to-side colonoclonal anastomosis, resulting in loss of the ileocecal valve. Continues FOLFOX chemotherapy, initiated May 15, 2024. Recent CT imaging on April 15, 2024, showed pulmonary metastatic disease, a new 10 mm liver lesion, enlarging central mesenteric mass with adjacent lymphadenopathy, and an enlarging right-sided peritoneal implant.   Last colonoscopy on June 06, 2022, revealed a prior anastomosis, a 7 mm descending colon polyp (traditional serrated adenoma) removed, left-sided diverticulosis, and small internal hemorrhoids.  Chronic diarrhea persists, with six to eight loose, watery bowel movements daily. Stools are frequently unformed, sometimes consisting only of mucus, and no formed stool has been seen in two years. Reports significant mucus in the throat and attributes some to pulmonary secretions. Denies constipation. Uses a bidet seat for hygiene as needed.  No blood in stool or melena.  Manages mild chemotherapy-associated nausea with five to six small meals daily. Nausea has been mostly controlled with initial anti-nausea medication and steroids during the first infusion. Not currently using Zofran  or Imodium due to prior cardiac concerns and interaction with dofetilide .  Takes Zenpep  (pancreatic enzyme supplement, 60,000 units; previously two capsules with meals and  one with snacks, totaling about seven daily) and colestipol  (5 grams granules nightly). Believes colestipol  probably helps with symptoms. Has not paid for Zenpep  since the first month of the year due to insurance coverage. Scheduled to meet a new cardiologist on June 14, 2024. Feels generally okay and is able to tolerate ongoing chemotherapy, though describes it as not very much fun.   Review of Systems As per HPI, otherwise negative  Current Medications, Allergies, Past Medical History, Past Surgical History, Family History and Social History were reviewed in [employer] record.    Objective:   Physical Exam BP 130/62   Pulse 67   Ht 5' 8 (1.727 m)   Wt 211 lb 2 oz (95.8 kg)   BMI 32.10 kg/m  Gen: awake, alert, NAD HEENT: anicteric  Ext: no c/c/e Neuro: nonfocal  Labs CBC (05/14/2024): Hemoglobin 14, MCV 93.3, WBC 9.3, platelet count 197 CMP (05/14/2024): AST 25, ALT 21, alkaline phosphatase 81, total bilirubin 0.5, creatinine 1.1 INR (04/26/2024): 1.1 CEA (04/15/2024): 2.10  Radiology CT chest, abdomen, pelvis (04/15/2024): Progressive pulmonary metastatic disease, new 10 mm hepatic lesion concerning for metastatic focus, enlarging central mesenteric mass with adjacent lymphadenopathy, enlarging right peritoneal implant, stable post-right hemicolectomy changes, advanced atherosclerotic calcifications, prostate findings not specified MRI lumbar spine (02/29/2024): Multilevel spondylosis with disc bulging, moderate multifactorial spinal stenosis at L4-5 with moderate right osseous foraminal narrowing, mild to moderate left osseous foraminal narrowing at L5-S1, multifactorial spinal stenosis at L2-3 and L3-4  Diagnostic Colonoscopy (06/06/2022): Prior end-to-side colonocolonic anastomosis in ascending colon, 7 mm descending colon polyp excised, diverticulosis in left colon, small internal hemorrhoids  Pathology IR biopsy retroperitoneal implant  (04/2024): Adenocarcinoma      Assessment & Plan:   Metastatic colon cancer of the cecum with liver, lung, and peritoneal metastases  Progressive, unresectable metastatic colon cancer with ongoing FOLFOX chemotherapy contributing to gastrointestinal symptoms.  - Continued FOLFOX chemotherapy as managed by oncology. - Coordinated care with oncology and cardiology regarding medication interactions and management of chemotherapy side effects.  Chronic diarrhea due to EPI, prior right hemicolectomy and bile salt diarrhea Chronic, multifactorial diarrhea significantly impacting quality of life. Ongoing evaluation of enzyme replacement necessity. - Ordered fecal elastase stool test to assess for pancreatic exocrine insufficiency and determine ongoing need for Zenpep .  If normal we can stop Zenpep  - Instructed him to continue Zenpep  with meals containing fat (one capsule per small meal, two for larger or fatty meals) until fecal elastase results are available. - Provided Zenpep  refills for up to seven capsules daily and will provide samples if needed. - Instructed him to double colestipol  dose to 10 grams nightly (two scoops) to improve stool consistency and reduce frequency. 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 - Instructed him to message via MyChart in two weeks after increasing colestipol  to report on bowel movement frequency and consistency. - Scheduled follow-up in 2-3 months to reassess symptoms and management plan.  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  "

## 2024-05-23 NOTE — Patient Instructions (Addendum)
 Your provider has requested that you go to the basement level for lab work before leaving today. Press B on the elevator. The lab is located at the first door on the left as you exit the elevator.  We have sent the following medications to your pharmacy for you to pick up at your convenience: Zenpep . Taking at least 7 pills a day.   Increase your colestipol  2 scoops (total 10 g) at bedtime.  _______________________________________________________  If your blood pressure at your visit was 140/90 or greater, please contact your primary care physician to follow up on this.  _______________________________________________________  If you are age 89 or older, your body mass index should be between 23-30. Your Body mass index is 32.1 kg/m. If this is out of the aforementioned range listed, please consider follow up with your Primary Care Provider.  If you are age 65 or younger, your body mass index should be between 19-25. Your Body mass index is 32.1 kg/m. If this is out of the aformentioned range listed, please consider follow up with your Primary Care Provider.   ________________________________________________________  The [facility] providers would like to encourage you to use MYCHART to communicate with providers for non-urgent requests or questions.  Due to long hold times on the telephone, sending your provider a message by [facility] may be a faster and more efficient way to get a response.  Please allow 48 business hours for a response.  Please remember that this is for non-urgent requests.  _______________________________________________________  [facility] is using a team-based approach to care.  Your team is made up of your doctor and two to three APPS. Our APPS (Nurse Practitioners and Physician Assistants) work with your physician to ensure care continuity for you. They are fully qualified to address your health concerns and develop a treatment plan. They communicate  directly with your gastroenterologist to care for you. Seeing the Advanced Practice Practitioners on your physician's team can help you by facilitating care more promptly, often allowing for earlier appointments, access to diagnostic testing, procedures, and other specialty referrals.

## 2024-05-24 ENCOUNTER — Other Ambulatory Visit

## 2024-05-24 ENCOUNTER — Other Ambulatory Visit (HOSPITAL_BASED_OUTPATIENT_CLINIC_OR_DEPARTMENT_OTHER): Payer: Self-pay

## 2024-05-24 DIAGNOSIS — K529 Noninfective gastroenteritis and colitis, unspecified: Secondary | ICD-10-CM

## 2024-05-25 ENCOUNTER — Other Ambulatory Visit: Payer: Self-pay | Admitting: Oncology

## 2024-05-30 ENCOUNTER — Other Ambulatory Visit (HOSPITAL_BASED_OUTPATIENT_CLINIC_OR_DEPARTMENT_OTHER): Payer: Self-pay

## 2024-05-30 ENCOUNTER — Inpatient Hospital Stay: Admitting: Nurse Practitioner

## 2024-05-30 ENCOUNTER — Encounter: Payer: Self-pay | Admitting: Nurse Practitioner

## 2024-05-30 ENCOUNTER — Inpatient Hospital Stay

## 2024-05-30 ENCOUNTER — Other Ambulatory Visit: Payer: Self-pay

## 2024-05-30 VITALS — BP 134/73 | HR 77 | Temp 97.8°F | Resp 18 | Ht 68.0 in | Wt 212.3 lb

## 2024-05-30 DIAGNOSIS — C182 Malignant neoplasm of ascending colon: Secondary | ICD-10-CM | POA: Diagnosis not present

## 2024-05-30 LAB — CBC WITH DIFFERENTIAL (CANCER CENTER ONLY)
Abs Immature Granulocytes: 0.02 K/uL (ref 0.00–0.07)
Basophils Absolute: 0 K/uL (ref 0.0–0.1)
Basophils Relative: 1 %
Eosinophils Absolute: 0.1 K/uL (ref 0.0–0.5)
Eosinophils Relative: 3 %
HCT: 37 % — ABNORMAL LOW (ref 39.0–52.0)
Hemoglobin: 12.9 g/dL — ABNORMAL LOW (ref 13.0–17.0)
Immature Granulocytes: 0 %
Lymphocytes Relative: 21 %
Lymphs Abs: 1.1 K/uL (ref 0.7–4.0)
MCH: 32.2 pg (ref 26.0–34.0)
MCHC: 34.9 g/dL (ref 30.0–36.0)
MCV: 92.3 fL (ref 80.0–100.0)
Monocytes Absolute: 0.7 K/uL (ref 0.1–1.0)
Monocytes Relative: 13 %
Neutro Abs: 3.2 K/uL (ref 1.7–7.7)
Neutrophils Relative %: 62 %
Platelet Count: 141 K/uL — ABNORMAL LOW (ref 150–400)
RBC: 4.01 MIL/uL — ABNORMAL LOW (ref 4.22–5.81)
RDW: 13.4 % (ref 11.5–15.5)
WBC Count: 5.1 K/uL (ref 4.0–10.5)
nRBC: 0 % (ref 0.0–0.2)

## 2024-05-30 LAB — CMP (CANCER CENTER ONLY)
ALT: 25 U/L (ref 0–44)
AST: 24 U/L (ref 15–41)
Albumin: 3.9 g/dL (ref 3.5–5.0)
Alkaline Phosphatase: 74 U/L (ref 38–126)
Anion gap: 11 (ref 5–15)
BUN: 13 mg/dL (ref 8–23)
CO2: 23 mmol/L (ref 22–32)
Calcium: 9.4 mg/dL (ref 8.9–10.3)
Chloride: 106 mmol/L (ref 98–111)
Creatinine: 0.89 mg/dL (ref 0.61–1.24)
GFR, Estimated: >60 mL/min
Glucose, Bld: 129 mg/dL — ABNORMAL HIGH (ref 70–99)
Potassium: 4.3 mmol/L (ref 3.5–5.1)
Sodium: 139 mmol/L (ref 135–145)
Total Bilirubin: 0.5 mg/dL (ref 0.0–1.2)
Total Protein: 6.5 g/dL (ref 6.5–8.1)

## 2024-05-30 LAB — PANCREATIC ELASTASE, FECAL: Pancreatic Elastase-1, Stool: 241 ug/g

## 2024-05-30 MED ORDER — SODIUM CHLORIDE 0.9 % IV SOLN
2000.0000 mg/m2 | INTRAVENOUS | Status: DC
  Administered 2024-05-30: 4300 mg via INTRAVENOUS
  Filled 2024-05-30: qty 86

## 2024-05-30 MED ORDER — OXALIPLATIN CHEMO INJECTION 100 MG/20ML
65.0000 mg/m2 | Freq: Once | INTRAVENOUS | Status: AC
  Administered 2024-05-30: 150 mg via INTRAVENOUS
  Filled 2024-05-30: qty 20

## 2024-05-30 MED ORDER — FLUOROURACIL CHEMO INJECTION 2.5 GM/50ML
400.0000 mg/m2 | Freq: Once | INTRAVENOUS | Status: AC
  Administered 2024-05-30: 850 mg via INTRAVENOUS
  Filled 2024-05-30: qty 17

## 2024-05-30 MED ORDER — DEXAMETHASONE 4 MG PO TABS
4.0000 mg | ORAL_TABLET | Freq: Every day | ORAL | 0 refills | Status: AC
  Filled 2024-05-30: qty 12, 12d supply, fill #0

## 2024-05-30 MED ORDER — DEXAMETHASONE SOD PHOSPHATE PF 10 MG/ML IJ SOLN
10.0000 mg | Freq: Once | INTRAMUSCULAR | Status: AC
  Administered 2024-05-30: 10 mg via INTRAVENOUS
  Filled 2024-05-30: qty 1

## 2024-05-30 MED ORDER — DEXTROSE 5 % IV SOLN: INTRAVENOUS | Status: DC

## 2024-05-30 MED ORDER — LEUCOVORIN CALCIUM INJECTION 350 MG
400.0000 mg/m2 | Freq: Once | INTRAVENOUS | Status: AC
  Administered 2024-05-30: 864 mg via INTRAVENOUS
  Filled 2024-05-30: qty 43.2

## 2024-05-30 NOTE — Patient Instructions (Signed)

## 2024-05-30 NOTE — Progress Notes (Signed)
 " [facility] OFFICE PROGRESS NOTE   Diagnosis: Colon cancer  INTERVAL HISTORY:   [name] returns as scheduled.  He completed cycle 1 FOLFOX 05/15/2024.  He had a single episode of nausea day 1.  He had 1 or 2 tiny mouth sores that resolved over the course of the day.  He was able to eat and drink without difficulty.  No significant cold sensitivity.  No change in baseline loose stools.  He had a few days of fatigue.  No chest pain or shortness of breath.  Objective:  Vital signs in last 24 hours:  Blood pressure 134/73, pulse 77, temperature 97.8 F (36.6 C), resp. rate 18, height 5' 8 (1.727 m), weight 212 lb 4.8 oz (96.3 kg), SpO2 98%.    HEENT: No thrush or ulcers. Resp: Lungs clear bilaterally. Cardio: Regular rate and rhythm. GI: No hepatosplenomegaly.  Mild tenderness right abdomen. Vascular: No leg edema. Neuro: Alert and oriented. Skin: Palms without erythema. Port-A-Cath without erythema.  Lab Results:  Lab Results  Component Value Date   WBC 5.1 05/30/2024   HGB 12.9 (L) 05/30/2024   HCT 37.0 (L) 05/30/2024   MCV 92.3 05/30/2024   PLT 141 (L) 05/30/2024   NEUTROABS 3.2 05/30/2024    Imaging:  No results found.  Medications: I have reviewed the patient's current medications.  Assessment/Plan: Colon cancer-stage III (T3N1), cecum mass biopsy 02/03/2021-adenocarcinoma CTs 02/16/2021-stable interstitial lung disease, mild COPD, cardiomegaly, CAD, multiple liver cysts with a few subcentimeter too small to characterize liver lesions, circumferential wall thickening at the ileocecal valve, right lower quadrant ileocolic lymph node measures 1.1 cm 03/24/2021, robotic assisted laparoscopic right colectomy-T3, 1/8 lymph nodes, 1 tumor deposit, lymphovascular invasion, mismatch repair protein expression intact, MSS: Foundation 1-HRD signature negative, MSS, tumor mutation burden 1, K-ras G12V Cycle 1 adjuvant Xeloda  04/21/2021 Cycle 2 adjuvant Xeloda   05/12/2021 Cycle 3 adjuvant Xeloda  06/07/2021 (5-day delay in start date due to foot discomfort, travel plans) Cycle 4 adjuvant Xeloda  06/29/2021, Xeloda  dose reduced to 1000 mg twice daily for 14 days Cycle 5 adjuvant Xeloda  07/19/2021 Cycle 6 adjuvant Xeloda  08/09/2021 Cycle 7 adjuvant Xeloda  08/30/2021 Cycle 8 adjuvant Xeloda  09/20/2021 CTs 02/14/2022-no evidence of recurrent disease CT abdomen/pelvis 03/14/2023- right central mesentery and right iliac fossa implants PET 02/01/2023: Hypermetabolic right small bowel mesenteric mass, mild focal hypermetabolism in the distal esophagus-nonspecific.  The right pelvic nodule is hypermetabolic. CT abdomen/pelvis 08/01/2023: Mild increase in size of right abdominal mesenteric and right lower quadrant peritoneal metastases, no new site of metastatic disease CTs 12/07/2023:  enlargement of central mesenteric mass, able to centimeter mass adjacent to the right psoas, new pulmonary nodules measuring up to 4 mm CTs 04/15/2024: New and enlarging pulmonary nodules, enlargement of central mesenteric mass and right sided peritoneal implant, new 10 mm low-attenuation segment 5/6 liver lesion, enlargement of lymph nodes adjacent to the mesenteric mass 04/26/2024 CT biopsy of right retroperitoneal mass: Adenocarcinoma, CK20 and CDX2 positive consistent with a colonic primary Cycle 1 FOLFOX 05/15/2024, dexamethasone  prophylaxis Cycle 2 FOLFOX 05/30/2024 CAD Interstitial lung disease Iron  deficiency anemia secondary to #1 Multiple colon polyps on the colonoscopy 02/03/2021-microscopic fragments of adenocarcinoma involving biopsies of transverse and ascending colon polyps felt to be contaminant from the cecum biopsy 2 tubular adenomas on the right colectomy specimen 03/24/2021 OSA Sick sinus syndrome-pacemaker Atrial fibrillation-maintained on apixaban  Chronic diastolic CHF History of multiple skin cancers    Disposition: [name] has completed 1 cycle of FOLFOX.  Overall  he tolerated  well.  Plan to proceed with cycle 2 today as scheduled.  He will begin dexamethasone  prophylaxis tomorrow.  CBC and chemistry panel reviewed.  Labs are adequate for treatment.  QTc goal range.  Check EKG prior to next chemotherapy in 2 weeks.  He will return for follow-up and cycle 3 FOLFOX in 2 weeks.  We are available to see him sooner if needed.    Olam Ned ANP/GNP-BC   05/30/2024  9:44 AM        "

## 2024-05-30 NOTE — Progress Notes (Signed)
 Patient seen by Olam Ned NP today  Vitals are within treatment parameters:Yes   Labs are within treatment parameters: Yes   Treatment plan has been signed: No   Per physician team, Patient is ready for treatment and there are NO modifications to the treatment plan.   EKG is good for treatment

## 2024-05-30 NOTE — Patient Instructions (Signed)
 [facility] - A DEPT OF [facility]  Discharge Instructions: Thank you for choosing [facility] to provide your oncology and hematology care.  DO NOT PICK UP THE ZOFRAN  (ONDANSETRON ). Will use the Decadron  (dexamethasone ) daily for nausea.  If you have a lab appointment with the Cancer Center, please go directly to the Cancer Center and check in at the registration area.   Wear comfortable clothing and clothing appropriate for easy access to any Portacath or PICC line.   We strive to give you quality time with your provider. You may need to reschedule your appointment if you arrive late (15 or more minutes).  Arriving late affects you and other patients whose appointments are after yours.  Also, if you miss three or more appointments without notifying the office, you may be dismissed from the clinic at the providers discretion.      For prescription refill requests, have your pharmacy contact our office and allow 72 hours for refills to be completed.    Today you received the following chemotherapy and/or immunotherapy agents: oxaliplatin, leucovorin and fluorouracil (5FU)   To help prevent nausea and vomiting after your treatment, we encourage you to take your nausea medication as directed.  BELOW ARE SYMPTOMS THAT SHOULD BE REPORTED IMMEDIATELY: *FEVER GREATER THAN 100.4 F (38 C) OR HIGHER *CHILLS OR SWEATING *NAUSEA AND VOMITING THAT IS NOT CONTROLLED WITH YOUR NAUSEA MEDICATION *UNUSUAL SHORTNESS OF BREATH *UNUSUAL BRUISING OR BLEEDING *URINARY PROBLEMS (pain or burning when urinating, or frequent urination) *BOWEL PROBLEMS (unusual diarrhea, constipation, pain near the anus) TENDERNESS IN MOUTH AND THROAT WITH OR WITHOUT PRESENCE OF ULCERS (sore throat, sores in mouth, or a toothache) UNUSUAL RASH, SWELLING OR PAIN  UNUSUAL VAGINAL DISCHARGE OR ITCHING   Items with * indicate a potential emergency and should be followed up as soon as  possible or go to the Emergency Department if any problems should occur.  Please show the CHEMOTHERAPY ALERT CARD or IMMUNOTHERAPY ALERT CARD at check-in to the Emergency Department and triage nurse.  Should you have questions after your visit or need to cancel or reschedule your appointment, please contact [facility]  Dept: [phone]  and follow the prompts.  Office hours are 8:00 a.m. to 4:30 p.m. Monday - Friday. Please note that voicemails left after 4:00 p.m. may not be returned until the following business day.  We are closed weekends and major holidays. You have access to a nurse at all times for urgent questions. Please call the main number to the clinic Dept: [phone] and follow the prompts.   For any non-urgent questions, you may also contact your provider using MyChart. We now offer e-Visits for anyone 69 and older to request care online for non-urgent symptoms. For details visit [email or web address].   Also download the MyChart app! Go to the app store, search MyChart, open the app, select [facility], and log in with your MyChart username and password. ______________________________________________________________  The chemotherapy medication bag should finish at 46 hours, 96 hours, or 7 days. For example, if your pump is scheduled for 46 hours and it was put on at 4:00 p.m., it should finish at 2:00 p.m. the day it is scheduled to come off regardless of your appointment time.     Estimated time to finish at 12:15pm on Friday 05/17/24.   If the display on your pump reads Low Volume and it is beeping,  take the batteries out of the pump and come to the cancer center for it to be taken off.   If the pump alarms go off prior to the pump reading Low Volume then call [phone] and someone can assist you.  If the plunger comes out and the chemotherapy medication is leaking out, please use your home chemo spill kit  to clean up the spill. Do NOT use paper towels or other household products.  If you have problems or questions regarding your pump, please call either [phone] (24 hours a day) or the cancer center Monday-Friday 8:00 a.m.- 4:30 p.m. at the clinic number and we will assist you. If you are unable to get assistance, then go to the nearest Emergency Department and ask the staff to contact the IV team for assistance.

## 2024-05-31 ENCOUNTER — Ambulatory Visit: Payer: Self-pay | Admitting: Internal Medicine

## 2024-06-01 ENCOUNTER — Inpatient Hospital Stay

## 2024-06-02 ENCOUNTER — Encounter: Payer: Self-pay | Admitting: Nurse Practitioner

## 2024-06-02 ENCOUNTER — Other Ambulatory Visit (HOSPITAL_BASED_OUTPATIENT_CLINIC_OR_DEPARTMENT_OTHER): Payer: Self-pay

## 2024-06-02 ENCOUNTER — Other Ambulatory Visit: Payer: Self-pay | Admitting: Student

## 2024-06-03 ENCOUNTER — Other Ambulatory Visit (HOSPITAL_BASED_OUTPATIENT_CLINIC_OR_DEPARTMENT_OTHER): Payer: Self-pay

## 2024-06-03 MED ORDER — DILTIAZEM HCL ER COATED BEADS 120 MG PO CP24
120.0000 mg | ORAL_CAPSULE | Freq: Two times a day (BID) | ORAL | 0 refills | Status: AC
  Filled 2024-06-03 – 2024-06-07 (×2): qty 60, 30d supply, fill #0

## 2024-06-07 ENCOUNTER — Encounter (HOSPITAL_BASED_OUTPATIENT_CLINIC_OR_DEPARTMENT_OTHER): Payer: Self-pay | Admitting: Pulmonary Disease

## 2024-06-07 ENCOUNTER — Other Ambulatory Visit: Payer: Self-pay

## 2024-06-07 ENCOUNTER — Other Ambulatory Visit (HOSPITAL_BASED_OUTPATIENT_CLINIC_OR_DEPARTMENT_OTHER): Payer: Self-pay

## 2024-06-07 ENCOUNTER — Ambulatory Visit (HOSPITAL_BASED_OUTPATIENT_CLINIC_OR_DEPARTMENT_OTHER): Admitting: Pulmonary Disease

## 2024-06-07 VITALS — BP 117/75 | HR 68 | Ht 68.0 in | Wt 204.0 lb

## 2024-06-07 DIAGNOSIS — Z87891 Personal history of nicotine dependence: Secondary | ICD-10-CM | POA: Diagnosis not present

## 2024-06-07 DIAGNOSIS — G4733 Obstructive sleep apnea (adult) (pediatric): Secondary | ICD-10-CM

## 2024-06-07 DIAGNOSIS — J8410 Pulmonary fibrosis, unspecified: Secondary | ICD-10-CM | POA: Diagnosis not present

## 2024-06-07 DIAGNOSIS — J849 Interstitial pulmonary disease, unspecified: Secondary | ICD-10-CM

## 2024-06-07 NOTE — Patient Instructions (Signed)
" °  VISIT SUMMARY: During your follow-up appointment, we discussed your obstructive sleep apnea and pulmonary fibrosis. You reported improvements in sleep quality with your CPAP machine and noted some challenges with your treatment regimen. We also reviewed your recent scan results and discussed your overall health maintenance.  YOUR PLAN: -OBSTRUCTIVE SLEEP APNEA: Obstructive sleep apnea is a condition where your breathing stops and starts during sleep due to blocked airways. Your CPAP therapy is effectively managing this condition, and you should continue using the machine every night. Ensure regular maintenance and proper fit of your CPAP mask to maintain its effectiveness.  -PULMONARY FIBROSIS: Pulmonary fibrosis is a condition where lung tissue becomes damaged and scarred, making it difficult to breathe. Your condition is stable, and recent scans show no significant progression. Continue to monitor your condition with regular scans every few months and avoid crowds and exposure to illness, especially during flu season.  INSTRUCTIONS: Please continue using your CPAP machine every night and ensure the mask fits properly. Follow up with regular scans for your pulmonary fibrosis every few months. Avoid crowds and exposure to illness, especially during flu season.    Contains text generated by Abridge.   "

## 2024-06-07 NOTE — Progress Notes (Signed)
 "  Subjective:    Patient ID: Donald [name], male    DOB: [birth year], 89 y.o.   MRN: [id number]   89 yo ex-smoker FU of OSA and ILD,first noted 2019   -indeterminate  DD include hypersensitivity pneumonitis given his exposure to down pillow and comforter or IPF as he has a family history of pulmonary fibrosis    He used dental appliances without any benefit before getting on CPAP  Smoked a pack per day until he quit in 2000, about 40 pack years   PMH -atrial fibrillation status post PPM stage III colon cancer - s/p  right colectomy in November 2022 and chemotherapy, now metastatic to lung     Discussed the use of AI scribe software for clinical note transcription with the patient, who gave verbal consent to proceed.  History of Present Illness Donald [name] is an 89 year old male with obstructive sleep apnea who presents for follow-up.  He uses his CPAP machine every night with improved sleep quality. He recently corrected a mask seal issue by tightening the straps. Machine data show a slight increase in apnea events over the past four nights, but he feels this has been his best sleep in two to three weeks.  He is receiving a treatment regimen every other week and has completed two cycles. He finds it challenging but manageable.  He has lung nodules and pulmonary fibrosis followed with scans. He notes decreased stamina and breath compared with prior but remains able to walk and swim, although less often due to his treatment schedule and cold weather.  He has received a flu shot.    Reviewed oncology notes >> on cycle 2/6 folfox  Significant tests/ events reviewed   CT chest in 04/2013 showed mild emphysema PSG 04/2014 - 206 lbs-Moderate OSA-stopped breathing 24 times an hour.- corrected by C Pap 13 cm, large full facemask   CT high-resolution 02/16/2021-emphysema, stable interstitial lung disease and indeterminate pattern CT abdomen pelvis 02/14/2022-mild pulmonary  fibrosis in indeterminate pattern.  Appears unchanged   03/2023 HRCT >> mild NSIP, unchanged   PFTs: 04/18/2018 FVC 3.43 [96%), FEV1 2.63 [105%], F/F 77, TLC 6.37 [95%], DLCO 17.94 [60%]     04/22/2022 FVC 3.19 (94%)  FEV1 2.44 (105%), F/F76, TLC 5.49 [82%], DLCO 15.61 [70%] Mild diffusion defect  06/15/2023 FVC 3.33 [107%], FEV1 2.49 [118%], F/F75, TLC 6.10 [95%] DLCO 18.25 [87%] Normal test  Review of Systems  neg for any significant sore throat, dysphagia, itching, sneezing, nasal congestion or excess/ purulent secretions, fever, chills, sweats, unintended wt loss, pleuritic or exertional cp, hempoptysis, orthopnea pnd or change in chronic leg swelling. Also denies presyncope, palpitations, heartburn, abdominal pain, nausea, vomiting, diarrhea or change in bowel or urinary habits, dysuria,hematuria, rash, arthralgias, visual complaints, headache, numbness weakness or ataxia.      Objective:   Physical Exam  Gen. Pleasant, elderly,well-nourished, in no distress ENT - no thrush, no pallor/icterus,no post nasal drip Neck: No JVD, no thyromegaly, no carotid bruits Lungs: no use of accessory muscles, no dullness to percussion, clear without rales or rhonchi  Cardiovascular: Rhythm regular, heart sounds  normal, no murmurs or gallops, no peripheral edema Musculoskeletal: No deformities, no cyanosis or clubbing        Assessment & Plan:   Assessment and Plan Assessment & Plan Obstructive sleep apnea Well-managed with CPAP therapy. He reports consistent use of the CPAP machine with no missed nights. Recent improvement in mask seal after tightening straps. Apnea events  have increased slightly to 4-5 events per night over the last four nights, but remain within acceptable limits. Overall, the CPAP machine is effectively managing the condition. - Ensure regular maintenance and proper fit of CPAP mask. -reviewed DL data over his phone   Pulmonary fibrosis, favor NSIP ,  stable Well-managed. Recent scan by Dr. Beula showed no significant progression. Breathing is stable with no increased shortness of breath. Plan to monitor condition with regular scans every few months. - Continue monitoring with rimaging, will reiew as done by oncology - Advised to avoid crowds and exposure to illness, especially during flu season.     "

## 2024-06-08 ENCOUNTER — Other Ambulatory Visit (HOSPITAL_BASED_OUTPATIENT_CLINIC_OR_DEPARTMENT_OTHER): Payer: Self-pay

## 2024-06-09 ENCOUNTER — Other Ambulatory Visit: Payer: Self-pay | Admitting: Oncology

## 2024-06-09 ENCOUNTER — Other Ambulatory Visit (HOSPITAL_BASED_OUTPATIENT_CLINIC_OR_DEPARTMENT_OTHER): Payer: Self-pay

## 2024-06-12 ENCOUNTER — Other Ambulatory Visit: Payer: Self-pay

## 2024-06-12 ENCOUNTER — Encounter: Payer: Self-pay | Admitting: Nurse Practitioner

## 2024-06-12 ENCOUNTER — Inpatient Hospital Stay

## 2024-06-12 ENCOUNTER — Inpatient Hospital Stay: Payer: Self-pay

## 2024-06-12 ENCOUNTER — Inpatient Hospital Stay: Payer: Self-pay | Admitting: Nurse Practitioner

## 2024-06-12 ENCOUNTER — Other Ambulatory Visit: Payer: Self-pay | Admitting: *Deleted

## 2024-06-12 VITALS — BP 138/71 | HR 59 | Temp 98.2°F | Resp 18

## 2024-06-12 VITALS — BP 109/68 | HR 84 | Temp 97.8°F | Resp 18 | Ht 68.0 in | Wt 208.5 lb

## 2024-06-12 DIAGNOSIS — C182 Malignant neoplasm of ascending colon: Secondary | ICD-10-CM | POA: Diagnosis not present

## 2024-06-12 LAB — CBC WITH DIFFERENTIAL (CANCER CENTER ONLY)
Abs Immature Granulocytes: 0.01 10*3/uL (ref 0.00–0.07)
Basophils Absolute: 0 10*3/uL (ref 0.0–0.1)
Basophils Relative: 1 %
Eosinophils Absolute: 0.1 10*3/uL (ref 0.0–0.5)
Eosinophils Relative: 1 %
HCT: 37.6 % — ABNORMAL LOW (ref 39.0–52.0)
Hemoglobin: 12.8 g/dL — ABNORMAL LOW (ref 13.0–17.0)
Immature Granulocytes: 0 %
Lymphocytes Relative: 26 %
Lymphs Abs: 1.6 10*3/uL (ref 0.7–4.0)
MCH: 32.2 pg (ref 26.0–34.0)
MCHC: 34 g/dL (ref 30.0–36.0)
MCV: 94.5 fL (ref 80.0–100.0)
Monocytes Absolute: 0.8 10*3/uL (ref 0.1–1.0)
Monocytes Relative: 13 %
Neutro Abs: 3.5 10*3/uL (ref 1.7–7.7)
Neutrophils Relative %: 59 %
Platelet Count: 122 10*3/uL — ABNORMAL LOW (ref 150–400)
RBC: 3.98 MIL/uL — ABNORMAL LOW (ref 4.22–5.81)
RDW: 14.3 % (ref 11.5–15.5)
WBC Count: 5.9 10*3/uL (ref 4.0–10.5)
nRBC: 0 % (ref 0.0–0.2)

## 2024-06-12 LAB — CMP (CANCER CENTER ONLY)
ALT: 32 U/L (ref 0–44)
AST: 26 U/L (ref 15–41)
Albumin: 3.8 g/dL (ref 3.5–5.0)
Alkaline Phosphatase: 76 U/L (ref 38–126)
Anion gap: 10 (ref 5–15)
BUN: 15 mg/dL (ref 8–23)
CO2: 25 mmol/L (ref 22–32)
Calcium: 9.3 mg/dL (ref 8.9–10.3)
Chloride: 106 mmol/L (ref 98–111)
Creatinine: 0.96 mg/dL (ref 0.61–1.24)
GFR, Estimated: >60 mL/min
Glucose, Bld: 104 mg/dL — ABNORMAL HIGH (ref 70–99)
Potassium: 4.2 mmol/L (ref 3.5–5.1)
Sodium: 140 mmol/L (ref 135–145)
Total Bilirubin: 0.4 mg/dL (ref 0.0–1.2)
Total Protein: 6.5 g/dL (ref 6.5–8.1)

## 2024-06-12 MED ORDER — DEXTROSE 5 % IV SOLN: INTRAVENOUS | Status: DC

## 2024-06-12 MED ORDER — FLUOROURACIL CHEMO INJECTION 2.5 GM/50ML
400.0000 mg/m2 | Freq: Once | INTRAVENOUS | Status: AC
  Administered 2024-06-12: 850 mg via INTRAVENOUS
  Filled 2024-06-12: qty 17

## 2024-06-12 MED ORDER — SODIUM CHLORIDE 0.9 % IV SOLN
2000.0000 mg/m2 | INTRAVENOUS | Status: DC
  Administered 2024-06-12: 4300 mg via INTRAVENOUS
  Filled 2024-06-12: qty 86

## 2024-06-12 MED ORDER — OXALIPLATIN CHEMO INJECTION 100 MG/20ML
65.0000 mg/m2 | Freq: Once | INTRAVENOUS | Status: AC
  Administered 2024-06-12: 150 mg via INTRAVENOUS
  Filled 2024-06-12: qty 20

## 2024-06-12 MED ORDER — LEUCOVORIN CALCIUM INJECTION 350 MG
400.0000 mg/m2 | Freq: Once | INTRAVENOUS | Status: AC
  Administered 2024-06-12: 864 mg via INTRAVENOUS
  Filled 2024-06-12: qty 43.2

## 2024-06-12 MED ORDER — DEXAMETHASONE SOD PHOSPHATE PF 10 MG/ML IJ SOLN
10.0000 mg | Freq: Once | INTRAMUSCULAR | Status: AC
  Administered 2024-06-12: 10 mg via INTRAVENOUS

## 2024-06-12 NOTE — Progress Notes (Signed)
 Patient seen by Olam Ned NP today  Vitals are within treatment parameters:Yes   Labs are within treatment parameters: Yes   Treatment plan has been signed: Yes   Per physician team, Patient is ready for treatment and there are NO modifications to the treatment plan.

## 2024-06-12 NOTE — Progress Notes (Signed)
 " Donald [name]   Diagnosis: Colon cancer  INTERVAL HISTORY:   Donald [name] returns as scheduled.  He completed cycle 2 FOLFOX 05/30/2024.  He denies nausea/vomiting.  A few small mouth sores for a few days, does not interfere with ability to eat/drink.  No change in baseline bowel habits following chemotherapy.  No significant cold sensitivity.  Objective:  Vital signs in last 24 hours:  Blood pressure 109/68, pulse 84, temperature 97.8 F (36.6 C), temperature source Temporal, resp. rate 18, height 5' 8 (1.727 m), weight 208 lb 8 oz (94.6 kg), SpO2 98%.    HEENT: No thrush or ulcers. Resp: Lungs clear bilaterally. Cardio: Regular rate and rhythm. GI: No hepatosplenomegaly. Vascular: No leg edema. Neuro: Alert and oriented. Skin: Palms without erythema. Port-A-Cath without erythema.  Lab Results:  Lab Results  Component Value Date   WBC 5.9 06/12/2024   HGB 12.8 (L) 06/12/2024   HCT 37.6 (L) 06/12/2024   MCV 94.5 06/12/2024   PLT 122 (L) 06/12/2024   NEUTROABS 3.5 06/12/2024    Imaging:  No results found.  Medications: I have reviewed the patient's current medications.  Assessment/Plan: Colon cancer-stage III (T3N1), cecum mass biopsy 02/03/2021-adenocarcinoma CTs 02/16/2021-stable interstitial lung disease, mild COPD, cardiomegaly, CAD, multiple liver cysts with a few subcentimeter too small to characterize liver lesions, circumferential wall thickening at the ileocecal valve, right lower quadrant ileocolic lymph node measures 1.1 cm 03/24/2021, robotic assisted laparoscopic right colectomy-T3, 1/8 lymph nodes, 1 tumor deposit, lymphovascular invasion, mismatch repair protein expression intact, MSS: Foundation 1-HRD signature negative, MSS, tumor mutation burden 1, K-ras G12V Cycle 1 adjuvant Xeloda  04/21/2021 Cycle 2 adjuvant Xeloda  05/12/2021 Cycle 3 adjuvant Xeloda  06/07/2021 (5-day delay in start date due to foot discomfort, travel  plans) Cycle 4 adjuvant Xeloda  06/29/2021, Xeloda  dose reduced to 1000 mg twice daily for 14 days Cycle 5 adjuvant Xeloda  07/19/2021 Cycle 6 adjuvant Xeloda  08/09/2021 Cycle 7 adjuvant Xeloda  08/30/2021 Cycle 8 adjuvant Xeloda  09/20/2021 CTs 02/14/2022-no evidence of recurrent disease CT abdomen/pelvis 03/14/2023- right central mesentery and right iliac fossa implants PET 02/01/2023: Hypermetabolic right small bowel mesenteric mass, mild focal hypermetabolism in the distal esophagus-nonspecific.  The right pelvic nodule is hypermetabolic. CT abdomen/pelvis 08/01/2023: Mild increase in size of right abdominal mesenteric and right lower quadrant peritoneal metastases, no new site of metastatic disease CTs 12/07/2023:  enlargement of central mesenteric mass, able to centimeter mass adjacent to the right psoas, new pulmonary nodules measuring up to 4 mm CTs 04/15/2024: New and enlarging pulmonary nodules, enlargement of central mesenteric mass and right sided peritoneal implant, new 10 mm low-attenuation segment 5/6 liver lesion, enlargement of lymph nodes adjacent to the mesenteric mass 04/26/2024 CT biopsy of right retroperitoneal mass: Adenocarcinoma, CK20 and CDX2 positive consistent with a colonic primary Cycle 1 FOLFOX 05/15/2024, dexamethasone  prophylaxis Cycle 2 FOLFOX 05/30/2024, dexamethasone  prophylaxis Cycle 3 FOLFOX 06/12/2024, dexamethasone  prophylaxis CAD Interstitial lung disease Iron  deficiency anemia secondary to #1 Multiple colon polyps on the colonoscopy 02/03/2021-microscopic fragments of adenocarcinoma involving biopsies of transverse and ascending colon polyps felt to be contaminant from the cecum biopsy 2 tubular adenomas on the right colectomy specimen 03/24/2021 OSA Sick sinus syndrome-pacemaker Atrial fibrillation-maintained on apixaban  Chronic diastolic CHF History of multiple skin cancers    Disposition: Donald [name] appears stable.  He has completed 2 cycles of FOLFOX.  Overall  tolerating treatment well.  Plan to proceed with cycle 3 today as scheduled.  CBC and chemistry panel reviewed.  Labs adequate  for treatment.  QTc is in adequate range.  He will return for follow-up and cycle 4 FOLFOX in 2 weeks.  We are available to see him sooner if needed.  Patient seen with Dr. Cloretta.  Donald [name] ANP/GNP-BC   06/12/2024  11:25 AM   This was a shared visit with Donald [name].  Donald [name] has completed 2 cycles of FOLFOX.  He appears to be tolerating the chemotherapy well.  He will complete cycle 3 today. The plan is to obtain restaging CTs after cycle 5.  Arvella Cloretta, MD     "

## 2024-06-12 NOTE — Patient Instructions (Signed)
 [facility] - A DEPT OF [facility]  Discharge Instructions: Thank you for choosing [facility] to provide your oncology and hematology care.   If you have a lab appointment with the Cancer Center, please go directly to the Cancer Center and check in at the registration area.   Wear comfortable clothing and clothing appropriate for easy access to any Portacath or PICC line.   We strive to give you quality time with your provider. You may need to reschedule your appointment if you arrive late (15 or more minutes).  Arriving late affects you and other patients whose appointments are after yours.  Also, if you miss three or more appointments without notifying the office, you may be dismissed from the clinic at the provider's discretion.      For prescription refill requests, have your pharmacy contact our office and allow 72 hours for refills to be completed.    Today you received the following chemotherapy and/or immunotherapy agents: oxaliplatin , leucovorin , fluorouracil        To help prevent nausea and vomiting after your treatment, we encourage you to take your nausea medication as directed.  BELOW ARE SYMPTOMS THAT SHOULD BE REPORTED IMMEDIATELY: *FEVER GREATER THAN 100.4 F (38 C) OR HIGHER *CHILLS OR SWEATING *NAUSEA AND VOMITING THAT IS NOT CONTROLLED WITH YOUR NAUSEA MEDICATION *UNUSUAL SHORTNESS OF BREATH *UNUSUAL BRUISING OR BLEEDING *URINARY PROBLEMS (pain or burning when urinating, or frequent urination) *BOWEL PROBLEMS (unusual diarrhea, constipation, pain near the anus) TENDERNESS IN MOUTH AND THROAT WITH OR WITHOUT PRESENCE OF ULCERS (sore throat, sores in mouth, or a toothache) UNUSUAL RASH, SWELLING OR PAIN  UNUSUAL VAGINAL DISCHARGE OR ITCHING   Items with * indicate a potential emergency and should be followed up as soon as possible or go to the Emergency Department if any problems should occur.  Please show the CHEMOTHERAPY  ALERT CARD or IMMUNOTHERAPY ALERT CARD at check-in to the Emergency Department and triage nurse.  Should you have questions after your visit or need to cancel or reschedule your appointment, please contact [facility]  Dept: [phone]  and follow the prompts.  Office hours are 8:00 a.m. to 4:30 p.m. Monday - Friday. Please note that voicemails left after 4:00 p.m. may not be returned until the following business day.  We are closed weekends and major holidays. You have access to a nurse at all times for urgent questions. Please call the main number to the clinic Dept: [phone] and follow the prompts.   For any non-urgent questions, you may also contact your provider using MyChart. We now offer e-Visits for anyone 22 and older to request care online for non-urgent symptoms. For details visit [email or web address].   Also download the MyChart app! Go to the app store, search MyChart, open the app, select [facility], and log in with your MyChart username and password.

## 2024-06-12 NOTE — Patient Instructions (Signed)

## 2024-06-13 ENCOUNTER — Inpatient Hospital Stay

## 2024-06-13 ENCOUNTER — Inpatient Hospital Stay: Admitting: Nurse Practitioner

## 2024-06-14 ENCOUNTER — Inpatient Hospital Stay: Payer: Self-pay

## 2024-06-14 ENCOUNTER — Encounter: Payer: Self-pay | Admitting: Oncology

## 2024-06-14 ENCOUNTER — Ambulatory Visit
Payer: Self-pay | Attending: Student in an Organized Health Care Education/Training Program | Admitting: Student in an Organized Health Care Education/Training Program

## 2024-06-14 ENCOUNTER — Encounter: Payer: Self-pay | Admitting: Student in an Organized Health Care Education/Training Program

## 2024-06-14 VITALS — BP 127/71 | HR 84 | Temp 98.2°F | Resp 18

## 2024-06-14 VITALS — BP 116/78 | HR 76 | Ht 68.5 in | Wt 214.8 lb

## 2024-06-14 DIAGNOSIS — I480 Paroxysmal atrial fibrillation: Secondary | ICD-10-CM

## 2024-06-14 DIAGNOSIS — C182 Malignant neoplasm of ascending colon: Secondary | ICD-10-CM

## 2024-06-14 NOTE — Progress Notes (Unsigned)
 " Cardiology Office Note   Date: 06/14/24 ID:  Donald [name], DOB 1932-07-23, MRN [id number] PCP: Charlanne Fredia CROME, MD  [facility] Providers Cardiologist:  None Electrophysiologist:  Donnice DELENA Primus, MD    History of Present Illness Donald [name] is a 89 y.o. 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   Discussed the use of AI scribe software for clinical note transcription with the patient, who gave verbal consent to proceed. History of Present Illness Donald Hallum is an 89 year old male with a pacemaker who presents for cardiovascular follow-up.  He has a history of pacemaker implantation and has been taking Tikosyn  (dofetilide ), with recent adjustments to the medication due to his chemotherapy regimen. He occasionally uses an oximeter to check his heart rate and can sometimes detect when his heart rate is not at baseline. He has experienced some episodes of arrhythmia, but they have been minor and infrequent. The dosage of Tikosyn  has been adjusted due to his chemotherapy regimen.  He is undergoing chemotherapy for recurrent colon cancer, initially diagnosed three years ago with a section of his colon resected. The cancer has returned in the mesentery. The chemotherapy has not been painful but has caused mental fog and 'real punky puny days'. He is on his third infusion, involving a two-hour session followed by a two-day portable pump infusion. He experiences variability in daily symptoms, making it difficult to plan activities.  He resides in a retirement community with his wife, living independently in a 1600 square foot villa. He manages most of his own cooking and has three children and four grandchildren. His family history includes a predisposition to dementia, which is a concern for both him and his wife. No pain from  cancer or chemotherapy, but he reports mental fog and fatigue from chemotherapy.  ROS: none  Studies Reviewed  ECG review 06/12/24: APVS 60, PR 208, QRS 86, QT/c 432/432 05/30/24: APVS 60, PR 218, QRS 90, QT/c 422/422 05/30/24: APVS 60, PR 222, QRS 88, QT/c 422/422 05/17/24: APVS 60, PR 208, QRS 104, QT/c 462/462  TTE Result date: 02/19/21  1. Left ventricular ejection fraction, by estimation, is 60 to 65%. The  left ventricle has normal function. The left ventricle has no regional  wall motion abnormalities. Left ventricular diastolic parameters are  consistent with Grade I diastolic  dysfunction (impaired relaxation).   2. Right ventricular systolic function is normal. The right ventricular  size is moderately enlarged.   3. Left atrial size was mildly dilated.   4. Right atrial size was mildly dilated.   5. The mitral valve is normal in structure. Moderate mitral valve  regurgitation. No evidence of mitral stenosis.   6. The aortic valve is tricuspid. Aortic valve regurgitation is moderate.  Mild to moderate aortic valve sclerosis/calcification is present, without  any evidence of aortic stenosis. Aortic regurgitation PHT measures 431  msec.   7. There is borderline dilatation of the ascending aorta, measuring 36  mm.   8. The inferior vena cava is normal in size with greater than 50%  respiratory variability, suggesting right atrial pressure of 3 mmHg.   Risk Assessment/Calculations  CHA2DS2-VASc Score = 4  This indicates a 4.8% annual risk of stroke. The patient's score is based upon: CHF History: 1 HTN History: 1 Diabetes History: 0 Stroke History: 0 Vascular Disease  History: 0 Age Score: 2 Gender Score: 0   Physical Exam VS:  BP 116/78   Pulse 76   Ht 5' 8.5 (1.74 m)   Wt 214 lb 12.8 oz (97.4 kg)   SpO2 98%   BMI 32.19 kg/m   Wt Readings from Last 3 Encounters:  06/14/24 214 lb 12.8 oz (97.4 kg)  06/12/24 208 lb 8 oz (94.6 kg)  06/07/24 204 lb (92.5  kg)    GEN: Well nourished, well developed in no acute distress NECK: No JVD; No carotid bruits CARDIAC: RRR, no murmurs, rubs, gallops RESPIRATORY: Clear to auscultation without rales, wheezing or rhonchi  ABDOMEN: Soft, non-tender, non-distended EXTREMITIES:  No edema; No deformity   ASSESSMENT AND PLAN Donald [name] is a 89 y.o. 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  Assessment & Plan  Atrial fibrillation with pacemaker Atrial fibrillation managed with pacemaker, infrequent irregular heart rhythm. Pacemaker function well-managed, less than 1% arrhythmia. Dofetilide  dosage adjusted during chemotherapy. - Continue pacemaker management. - Monitor heart rhythm and pacemaker function. - Reassess dofetilide  dosage post-chemotherapy.  Recurrent colon cancer on chemotherapy Recurrent colon cancer with previous resection, currently on chemotherapy.   - Continue chemotherapy   Dispo: RTC 1 yr  Signed, Donnice DELENA Primus, MD  "

## 2024-06-14 NOTE — Patient Instructions (Signed)

## 2024-06-14 NOTE — Patient Instructions (Signed)
 Medication Instructions:  Your physician recommends that you continue on your current medications as directed. Please refer to the Current Medication list given to you today.  *If you need a refill on your cardiac medications before your next appointment, please call your pharmacy*  Lab Work: None ordered.  If you have labs (blood work) drawn today and your tests are completely normal, you will receive your results only by: MyChart Message (if you have MyChart) OR A paper copy in the mail If you have any lab test that is abnormal or we need to change your treatment, we will call you to review the results.  Testing/Procedures: None ordered.   Follow-Up: At [facility], you and your health needs are our priority.  As part of our continuing mission to provide you with exceptional heart care, our providers are all part of one team.  This team includes your primary Cardiologist (physician) and Advanced Practice Providers or APPs (Physician Assistants and Nurse Practitioners) who all work together to provide you with the care you need, when you need it.  Your next appointment:   12 months with Dr Almet[place] or APP

## 2024-06-15 ENCOUNTER — Inpatient Hospital Stay

## 2024-06-16 ENCOUNTER — Other Ambulatory Visit: Payer: Self-pay | Admitting: Student

## 2024-06-16 DIAGNOSIS — I4819 Other persistent atrial fibrillation: Secondary | ICD-10-CM

## 2024-06-17 ENCOUNTER — Other Ambulatory Visit (HOSPITAL_BASED_OUTPATIENT_CLINIC_OR_DEPARTMENT_OTHER): Payer: Self-pay

## 2024-06-17 ENCOUNTER — Other Ambulatory Visit: Payer: Self-pay

## 2024-06-17 MED ORDER — APIXABAN 5 MG PO TABS
5.0000 mg | ORAL_TABLET | Freq: Two times a day (BID) | ORAL | 1 refills | Status: AC
  Filled 2024-06-17: qty 180, 90d supply, fill #0

## 2024-06-17 MED ORDER — METOPROLOL TARTRATE 25 MG PO TABS
25.0000 mg | ORAL_TABLET | Freq: Two times a day (BID) | ORAL | 3 refills | Status: AC
  Filled 2024-06-17: qty 180, 90d supply, fill #0

## 2024-06-17 NOTE — Telephone Encounter (Signed)
 Prescription refill request for Eliquis  received. Indication: a fib Last office visit: 06/14/24 Scr:  0.96 epic 06/12/24 Age: 89 Weight: 214#

## 2024-06-26 ENCOUNTER — Inpatient Hospital Stay: Payer: Self-pay

## 2024-06-26 ENCOUNTER — Inpatient Hospital Stay: Payer: Self-pay | Admitting: Nurse Practitioner

## 2024-06-27 ENCOUNTER — Inpatient Hospital Stay

## 2024-06-27 ENCOUNTER — Inpatient Hospital Stay: Admitting: Oncology

## 2024-06-28 ENCOUNTER — Inpatient Hospital Stay: Payer: Self-pay

## 2024-06-29 ENCOUNTER — Inpatient Hospital Stay

## 2024-07-10 ENCOUNTER — Inpatient Hospital Stay: Payer: Self-pay

## 2024-07-10 ENCOUNTER — Inpatient Hospital Stay: Payer: Self-pay | Admitting: Oncology

## 2024-07-11 ENCOUNTER — Inpatient Hospital Stay

## 2024-07-11 ENCOUNTER — Inpatient Hospital Stay: Admitting: Nurse Practitioner

## 2024-07-12 ENCOUNTER — Inpatient Hospital Stay: Payer: Self-pay

## 2024-07-13 ENCOUNTER — Inpatient Hospital Stay

## 2024-07-24 ENCOUNTER — Inpatient Hospital Stay: Payer: Self-pay | Admitting: Oncology

## 2024-07-24 ENCOUNTER — Inpatient Hospital Stay: Payer: Self-pay

## 2024-07-25 ENCOUNTER — Inpatient Hospital Stay

## 2024-07-25 ENCOUNTER — Inpatient Hospital Stay: Admitting: Oncology

## 2024-07-26 ENCOUNTER — Inpatient Hospital Stay

## 2024-08-01 ENCOUNTER — Ambulatory Visit: Admitting: Internal Medicine

## 2024-11-05 ENCOUNTER — Encounter: Admitting: Internal Medicine
